# Patient Record
Sex: Female | Born: 1950
Health system: Southern US, Community
[De-identification: ages and names within clinical notes are randomized; demographics above are authoritative.]

## PROBLEM LIST (undated history)

## (undated) DIAGNOSIS — M1990 Unspecified osteoarthritis, unspecified site: Secondary | ICD-10-CM

## (undated) DIAGNOSIS — D649 Anemia, unspecified: Secondary | ICD-10-CM

## (undated) DIAGNOSIS — E669 Obesity, unspecified: Secondary | ICD-10-CM

## (undated) DIAGNOSIS — H269 Unspecified cataract: Secondary | ICD-10-CM

## (undated) DIAGNOSIS — F329 Major depressive disorder, single episode, unspecified: Secondary | ICD-10-CM

## (undated) DIAGNOSIS — R0600 Dyspnea, unspecified: Secondary | ICD-10-CM

## (undated) DIAGNOSIS — T7840XA Allergy, unspecified, initial encounter: Secondary | ICD-10-CM

## (undated) DIAGNOSIS — Z9221 Personal history of antineoplastic chemotherapy: Secondary | ICD-10-CM

## (undated) DIAGNOSIS — F32A Depression, unspecified: Secondary | ICD-10-CM

## (undated) DIAGNOSIS — C55 Malignant neoplasm of uterus, part unspecified: Secondary | ICD-10-CM

## (undated) DIAGNOSIS — G8929 Other chronic pain: Secondary | ICD-10-CM

## (undated) DIAGNOSIS — M545 Low back pain, unspecified: Secondary | ICD-10-CM

## (undated) DIAGNOSIS — C50919 Malignant neoplasm of unspecified site of unspecified female breast: Secondary | ICD-10-CM

## (undated) DIAGNOSIS — Z800 Family history of malignant neoplasm of digestive organs: Secondary | ICD-10-CM

## (undated) DIAGNOSIS — Z8614 Personal history of Methicillin resistant Staphylococcus aureus infection: Secondary | ICD-10-CM

## (undated) DIAGNOSIS — C189 Malignant neoplasm of colon, unspecified: Secondary | ICD-10-CM

## (undated) DIAGNOSIS — Z803 Family history of malignant neoplasm of breast: Secondary | ICD-10-CM

## (undated) DIAGNOSIS — E7800 Pure hypercholesterolemia, unspecified: Secondary | ICD-10-CM

## (undated) DIAGNOSIS — M549 Dorsalgia, unspecified: Secondary | ICD-10-CM

## (undated) DIAGNOSIS — K219 Gastro-esophageal reflux disease without esophagitis: Secondary | ICD-10-CM

## (undated) DIAGNOSIS — I10 Essential (primary) hypertension: Secondary | ICD-10-CM

## (undated) DIAGNOSIS — G5600 Carpal tunnel syndrome, unspecified upper limb: Secondary | ICD-10-CM

## (undated) DIAGNOSIS — G589 Mononeuropathy, unspecified: Secondary | ICD-10-CM

## (undated) HISTORY — DX: Essential (primary) hypertension: I10

## (undated) HISTORY — DX: Dorsalgia, unspecified: M54.9

## (undated) HISTORY — DX: Family history of malignant neoplasm of digestive organs: Z80.0

## (undated) HISTORY — PX: BREAST SURGERY: SHX581

## (undated) HISTORY — DX: Unspecified cataract: H26.9

## (undated) HISTORY — DX: Malignant neoplasm of unspecified site of unspecified female breast: C50.919

## (undated) HISTORY — DX: Carpal tunnel syndrome, unspecified upper limb: G56.00

## (undated) HISTORY — DX: Obesity, unspecified: E66.9

## (undated) HISTORY — DX: Allergy, unspecified, initial encounter: T78.40XA

## (undated) HISTORY — DX: Mononeuropathy, unspecified: G58.9

## (undated) HISTORY — DX: Family history of malignant neoplasm of breast: Z80.3

## (undated) HISTORY — DX: Malignant neoplasm of uterus, part unspecified: C55

## (undated) HISTORY — PX: RADIOFREQUENCY ABLATION: SHX2290

---
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
## 1992-07-13 DIAGNOSIS — C55 Malignant neoplasm of uterus, part unspecified: Secondary | ICD-10-CM

## 1992-07-13 HISTORY — DX: Malignant neoplasm of uterus, part unspecified: C55

## 1992-07-13 HISTORY — PX: ABDOMINAL HYSTERECTOMY: SHX81

## 2004-07-26 ENCOUNTER — Emergency Department: Payer: Self-pay | Admitting: Emergency Medicine

## 2006-02-05 ENCOUNTER — Ambulatory Visit: Payer: Self-pay | Admitting: General Surgery

## 2006-02-16 ENCOUNTER — Ambulatory Visit: Payer: Self-pay | Admitting: Family Medicine

## 2007-05-24 ENCOUNTER — Ambulatory Visit: Payer: Self-pay | Admitting: Family Medicine

## 2007-07-18 DIAGNOSIS — F4320 Adjustment disorder, unspecified: Secondary | ICD-10-CM | POA: Insufficient documentation

## 2008-04-21 DIAGNOSIS — F17200 Nicotine dependence, unspecified, uncomplicated: Secondary | ICD-10-CM | POA: Insufficient documentation

## 2008-05-25 ENCOUNTER — Ambulatory Visit: Payer: Self-pay | Admitting: Family Medicine

## 2009-08-30 DIAGNOSIS — E7800 Pure hypercholesterolemia, unspecified: Secondary | ICD-10-CM | POA: Insufficient documentation

## 2010-08-22 ENCOUNTER — Ambulatory Visit: Payer: Self-pay | Admitting: Internal Medicine

## 2010-08-27 ENCOUNTER — Ambulatory Visit: Payer: Self-pay | Admitting: Family Medicine

## 2011-04-24 ENCOUNTER — Ambulatory Visit: Payer: Self-pay | Admitting: General Surgery

## 2011-04-24 LAB — HM COLONOSCOPY

## 2011-09-17 ENCOUNTER — Ambulatory Visit: Payer: Self-pay | Admitting: Family Medicine

## 2012-07-19 ENCOUNTER — Ambulatory Visit: Payer: Self-pay | Admitting: Family Medicine

## 2012-08-17 ENCOUNTER — Ambulatory Visit: Payer: Self-pay | Admitting: Neurology

## 2012-09-19 ENCOUNTER — Ambulatory Visit: Payer: Self-pay | Admitting: Neurology

## 2012-09-19 LAB — CSF CELL COUNT WITH DIFFERENTIAL
CSF Tube #: 1
CSF Tube #: 4
Eosinophil: 0 %
Eosinophil: 0 %
Lymphocytes: 80 %
Lymphocytes: 89 %
Monocytes/Macrophages: 20 %
Monocytes/Macrophages: 4 %
Neutrophils: 0 %
Neutrophils: 7 %
Other Cells: 0 %
Other Cells: 0 %
RBC (CSF): 0 /mm3
RBC (CSF): 250 /mm3
WBC (CSF): 22 /mm3
WBC (CSF): 49 /mm3

## 2012-09-19 LAB — ALBUMIN: Albumin: 3.6 g/dL (ref 3.4–5.0)

## 2012-09-19 LAB — PROTIME-INR
INR: 1
Prothrombin Time: 13 secs (ref 11.5–14.7)

## 2012-09-19 LAB — GLUCOSE, CSF: Glucose, CSF: 58 mg/dL (ref 40–75)

## 2012-09-19 LAB — PLATELET COUNT: Platelet: 240 10*3/uL (ref 150–440)

## 2012-09-19 LAB — PROTEIN, CSF: Protein, CSF: 41 mg/dL (ref 15–45)

## 2012-09-19 LAB — APTT: Activated PTT: 32.6 secs (ref 23.6–35.9)

## 2012-09-22 LAB — CSF CULTURE W GRAM STAIN

## 2012-09-30 ENCOUNTER — Ambulatory Visit: Payer: Self-pay | Admitting: Neurology

## 2013-07-13 DIAGNOSIS — C50919 Malignant neoplasm of unspecified site of unspecified female breast: Secondary | ICD-10-CM

## 2013-07-13 HISTORY — DX: Malignant neoplasm of unspecified site of unspecified female breast: C50.919

## 2013-11-20 ENCOUNTER — Ambulatory Visit: Payer: Self-pay | Admitting: Neurology

## 2013-11-23 ENCOUNTER — Ambulatory Visit: Payer: Self-pay | Admitting: Pain Medicine

## 2013-11-29 ENCOUNTER — Ambulatory Visit: Payer: Self-pay | Admitting: Pain Medicine

## 2013-12-26 ENCOUNTER — Ambulatory Visit: Payer: Self-pay | Admitting: Pain Medicine

## 2014-01-03 ENCOUNTER — Ambulatory Visit: Payer: Self-pay | Admitting: Pain Medicine

## 2014-01-30 ENCOUNTER — Ambulatory Visit: Payer: Self-pay | Admitting: Pain Medicine

## 2014-02-05 ENCOUNTER — Ambulatory Visit: Payer: Self-pay | Admitting: Pain Medicine

## 2014-03-06 ENCOUNTER — Ambulatory Visit: Payer: Self-pay | Admitting: Pain Medicine

## 2014-03-21 ENCOUNTER — Ambulatory Visit: Payer: Self-pay | Admitting: Pain Medicine

## 2014-04-17 ENCOUNTER — Ambulatory Visit: Payer: Self-pay | Admitting: Pain Medicine

## 2014-04-30 ENCOUNTER — Ambulatory Visit: Payer: Self-pay | Admitting: Pain Medicine

## 2014-05-03 ENCOUNTER — Ambulatory Visit: Payer: Self-pay | Admitting: Family Medicine

## 2014-05-09 ENCOUNTER — Ambulatory Visit: Payer: Self-pay | Admitting: Family Medicine

## 2014-05-13 HISTORY — PX: BREAST BIOPSY: SHX20

## 2014-05-16 ENCOUNTER — Ambulatory Visit: Payer: Self-pay | Admitting: Family Medicine

## 2014-05-22 ENCOUNTER — Ambulatory Visit: Payer: Self-pay | Admitting: Pain Medicine

## 2014-05-24 ENCOUNTER — Ambulatory Visit: Payer: Self-pay | Admitting: Oncology

## 2014-05-24 ENCOUNTER — Other Ambulatory Visit: Payer: Self-pay | Admitting: Family Medicine

## 2014-05-24 DIAGNOSIS — C50912 Malignant neoplasm of unspecified site of left female breast: Secondary | ICD-10-CM

## 2014-05-28 ENCOUNTER — Ambulatory Visit: Payer: Self-pay | Admitting: Pain Medicine

## 2014-05-29 ENCOUNTER — Ambulatory Visit: Payer: Self-pay | Admitting: Oncology

## 2014-05-29 LAB — CREATININE, SERUM
Creatinine: 1.1 mg/dL (ref 0.60–1.30)
EGFR (African American): >60
EGFR (Non-African Amer.): 53 — ABNORMAL LOW

## 2014-05-30 LAB — CANCER ANTIGEN 27.29: CA 27.29: 18 U/mL (ref 0.0–38.6)

## 2014-05-30 LAB — CEA: CEA: 2 ng/mL (ref 0.0–4.7)

## 2014-05-30 LAB — CA 125: CA 125: 8.4 U/mL (ref 0.0–34.0)

## 2014-05-30 LAB — CANCER ANTIGEN 19-9: CA 19-9: <1 U/mL (ref 0–35)

## 2014-06-06 ENCOUNTER — Ambulatory Visit
Admission: RE | Admit: 2014-06-06 | Discharge: 2014-06-06 | Disposition: A | Discharge status: Home / Self Care | Payer: BC Managed Care – PPO | Source: Ambulatory Visit | Attending: Family Medicine | Admitting: Family Medicine

## 2014-06-06 DIAGNOSIS — C50912 Malignant neoplasm of unspecified site of left female breast: Secondary | ICD-10-CM

## 2014-06-06 MED ORDER — GADOBENATE DIMEGLUMINE 529 MG/ML IV SOLN
20.0000 mL | Freq: Once | INTRAVENOUS | Status: AC | PRN
  Administered 2014-06-06: 20 mL via INTRAVENOUS

## 2014-06-12 ENCOUNTER — Ambulatory Visit: Payer: Self-pay | Admitting: Oncology

## 2014-06-20 ENCOUNTER — Encounter: Payer: Self-pay | Admitting: General Surgery

## 2014-06-20 ENCOUNTER — Ambulatory Visit (INDEPENDENT_AMBULATORY_CARE_PROVIDER_SITE_OTHER): Payer: BC Managed Care – PPO | Admitting: General Surgery

## 2014-06-20 VITALS — BP 140/80 | HR 74 | Resp 12 | Ht 64.0 in | Wt 211.0 lb

## 2014-06-20 DIAGNOSIS — C773 Secondary and unspecified malignant neoplasm of axilla and upper limb lymph nodes: Secondary | ICD-10-CM

## 2014-06-20 DIAGNOSIS — C50912 Malignant neoplasm of unspecified site of left female breast: Secondary | ICD-10-CM

## 2014-06-20 NOTE — Patient Instructions (Addendum)
Patient to be scheduled for port placement.   Patient's surgery has been scheduled for 07-03-14 at [facility].

## 2014-06-20 NOTE — Progress Notes (Addendum)
Patient ID: Carrie Alvarez, female   DOB: 12/31/1950, 63 y.o.   MRN: [id number]  Chief Complaint  Patient presents with  . Other    discuss port placement    HPI Carrie Alvarez is a 63 y.o. female.  Here today to discuss having a port placement.Patient states she had a mammgram,ultrasound and a left breast biopsy done in November 2015.Patient does performs self breast check and get regular mammograms. Patient to have chemo treatments. The patient is accompanied by her [place].  The patient's evaluation has shown an axillary lymph node positive for adenocarcinoma on biopsy. No breast primary has been identified.  HPI  Past Medical History  Diagnosis Date  . Hypertension     Past Surgical History  Procedure Laterality Date  . Abdominal hysterectomy    . Breast surgery    . Breast biopsy Left 05/2014    Family History  Problem Relation Age of Onset  . Breast cancer Sister   . Colon cancer Mother   . Colon cancer Sister     Social History History  Substance Use Topics  . Smoking status: Former Smoker -- 1.00 packs/day    Types: Cigarettes  . Smokeless tobacco: Never Used  . Alcohol Use: No    No Known Allergies  Current Outpatient Prescriptions  Medication Sig Dispense Refill  . amLODipine (NORVASC) 2.5 MG tablet Take 2.5 mg by mouth daily.   0  . gabapentin (NEURONTIN) 300 MG capsule Take 600 mg by mouth 2 (two) times daily.   2  . hydrochlorothiazide (MICROZIDE) 12.5 MG capsule Take 12.5 mg by mouth daily.   1  . meloxicam (MOBIC) 15 MG tablet Take 15 mg by mouth daily.   0  . sertraline (ZOLOFT) 50 MG tablet Take 200 mg by mouth at bedtime.   1  . traMADol (ULTRAM) 50 MG tablet 50 mg every 6 (six) hours as needed.   0  . vitamin B-12 (CYANOCOBALAMIN) 500 MCG tablet Take 500 mcg by mouth daily.     No current facility-administered medications for this visit.    Review of Systems Review of Systems  Constitutional: Negative.    Respiratory: Negative.   Cardiovascular: Negative.     Blood pressure 140/80, pulse 74, resp. rate 12, height 5\' 4"  (1.626 m), weight 211 lb (95.709 kg).  Physical Exam Physical Exam  Constitutional: She is oriented to person, place, and time. She appears well-developed and well-nourished.  Eyes: Conjunctivae are normal. No scleral icterus.  Neck: Neck supple.  Fullness in the left supraclavicular  area.   Cardiovascular: Normal rate, regular rhythm and normal heart sounds.   Pulmonary/Chest: Effort normal and breath sounds normal.  Lymphadenopathy:    She has cervical adenopathy.       Left cervical: Superficial cervical adenopathy present.    She has axillary adenopathy.  Modest upper extremity edema is noted on the left.  Measurements obtained 15 cm superior to the olecranon process, as well as 10 and 20 cm below the olecranon process on each upper extremity were obtained.  On the right side these measurements were 37, 31 and 21 cm respectively.  On the left upper extremity these measurements were 39.5, 32 and 22.5 cm.   Evidence of early lymphedema.  Neurological: She is alert and oriented to person, place, and time.  Skin: Skin is warm and dry.  Left forearm swelling.    Data Reviewed Biopsy shows metastatic adenocarcinoma, estrogen receptor positive.   Assessment  Metastatic breast cancer, need for central venous access.   Lymphedema left upper extremity secondary to nodal disease.    Plan    Discussed port placement. The risks associated with the procedure including vascular injury and the possible need for chest tube were reviewed.   Consideration should be given for early referral to occupational therapy if progressive edema is noted after initiation of chemotherapy.    Patient's surgery has been scheduled for 07-03-14 at [facility].  PCP:  Margarita Rana Ref: Dr. Tonny Bollman, Forest Gleason 06/21/2014, 7:39 AM

## 2014-06-21 ENCOUNTER — Ambulatory Visit: Payer: Self-pay | Admitting: Pain Medicine

## 2014-06-21 ENCOUNTER — Other Ambulatory Visit: Payer: Self-pay | Admitting: General Surgery

## 2014-06-21 DIAGNOSIS — C773 Secondary and unspecified malignant neoplasm of axilla and upper limb lymph nodes: Principal | ICD-10-CM

## 2014-06-21 DIAGNOSIS — C50912 Malignant neoplasm of unspecified site of left female breast: Secondary | ICD-10-CM

## 2014-06-21 DIAGNOSIS — C50919 Malignant neoplasm of unspecified site of unspecified female breast: Secondary | ICD-10-CM | POA: Insufficient documentation

## 2014-06-28 ENCOUNTER — Ambulatory Visit: Payer: Self-pay | Admitting: Anesthesiology

## 2014-06-28 LAB — POTASSIUM: Potassium: 3.8 mmol/L (ref 3.5–5.1)

## 2014-07-03 ENCOUNTER — Ambulatory Visit: Payer: Self-pay | Admitting: General Surgery

## 2014-07-03 DIAGNOSIS — C50912 Malignant neoplasm of unspecified site of left female breast: Secondary | ICD-10-CM

## 2014-07-09 ENCOUNTER — Encounter: Payer: Self-pay | Admitting: General Surgery

## 2014-07-09 LAB — COMPREHENSIVE METABOLIC PANEL
Albumin: 3.3 g/dL — ABNORMAL LOW (ref 3.4–5.0)
Alkaline Phosphatase: 81 U/L
Anion Gap: 6 — ABNORMAL LOW (ref 7–16)
BUN: 14 mg/dL (ref 7–18)
Bilirubin,Total: 0.5 mg/dL (ref 0.2–1.0)
Calcium, Total: 9.3 mg/dL (ref 8.5–10.1)
Chloride: 106 mmol/L (ref 98–107)
Co2: 31 mmol/L (ref 21–32)
Creatinine: 1.15 mg/dL (ref 0.60–1.30)
EGFR (African American): >60
EGFR (Non-African Amer.): 51 — ABNORMAL LOW
Glucose: 87 mg/dL (ref 65–99)
Osmolality: 285 (ref 275–301)
Potassium: 3.6 mmol/L (ref 3.5–5.1)
SGOT(AST): 13 U/L — ABNORMAL LOW (ref 15–37)
SGPT (ALT): 18 U/L
Sodium: 143 mmol/L (ref 136–145)
Total Protein: 7 g/dL (ref 6.4–8.2)

## 2014-07-09 LAB — CBC CANCER CENTER
Basophil #: 0.1 x10 3/mm (ref 0.0–0.1)
Basophil %: 0.7 %
Eosinophil #: 0.1 x10 3/mm (ref 0.0–0.7)
Eosinophil %: 1.7 %
HCT: 36.8 % (ref 35.0–47.0)
HGB: 12.2 g/dL (ref 12.0–16.0)
Lymphocyte #: 1.6 x10 3/mm (ref 1.0–3.6)
Lymphocyte %: 18.6 %
MCH: 27 pg (ref 26.0–34.0)
MCHC: 33.2 g/dL (ref 32.0–36.0)
MCV: 81 fL (ref 80–100)
Monocyte #: 0.5 x10 3/mm (ref 0.2–0.9)
Monocyte %: 5.7 %
Neutrophil #: 6.4 x10 3/mm (ref 1.4–6.5)
Neutrophil %: 73.3 %
Platelet: 237 x10 3/mm (ref 150–440)
RBC: 4.53 10*6/uL (ref 3.80–5.20)
RDW: 14.1 % (ref 11.5–14.5)
WBC: 8.7 x10 3/mm (ref 3.6–11.0)

## 2014-07-13 ENCOUNTER — Ambulatory Visit: Payer: Self-pay | Admitting: Oncology

## 2014-07-13 DIAGNOSIS — Z9221 Personal history of antineoplastic chemotherapy: Secondary | ICD-10-CM

## 2014-07-13 HISTORY — DX: Personal history of antineoplastic chemotherapy: Z92.21

## 2014-07-16 LAB — COMPREHENSIVE METABOLIC PANEL
Albumin: 3.5 g/dL (ref 3.4–5.0)
Alkaline Phosphatase: 101 U/L
Anion Gap: 11 (ref 7–16)
BUN: 22 mg/dL — ABNORMAL HIGH (ref 7–18)
Bilirubin,Total: 1.8 mg/dL — ABNORMAL HIGH (ref 0.2–1.0)
Calcium, Total: 9.5 mg/dL (ref 8.5–10.1)
Chloride: 102 mmol/L (ref 98–107)
Co2: 26 mmol/L (ref 21–32)
Creatinine: 0.97 mg/dL (ref 0.60–1.30)
EGFR (African American): >60
EGFR (Non-African Amer.): >60
Glucose: 95 mg/dL (ref 65–99)
Osmolality: 281 (ref 275–301)
Potassium: 3.3 mmol/L — ABNORMAL LOW (ref 3.5–5.1)
SGOT(AST): 18 U/L (ref 15–37)
SGPT (ALT): 41 U/L
Sodium: 139 mmol/L (ref 136–145)
Total Protein: 7.5 g/dL (ref 6.4–8.2)

## 2014-07-18 ENCOUNTER — Ambulatory Visit: Payer: Self-pay | Admitting: Pain Medicine

## 2014-07-23 LAB — CBC CANCER CENTER
Basophil #: 0.1 x10 3/mm (ref 0.0–0.1)
Basophil %: 0.6 %
Eosinophil #: 0 x10 3/mm (ref 0.0–0.7)
Eosinophil %: 0 %
HCT: 28.3 % — ABNORMAL LOW (ref 35.0–47.0)
HGB: 9.3 g/dL — ABNORMAL LOW (ref 12.0–16.0)
Lymphocyte #: 1.4 x10 3/mm (ref 1.0–3.6)
Lymphocyte %: 12.4 %
MCH: 27.8 pg (ref 26.0–34.0)
MCHC: 32.7 g/dL (ref 32.0–36.0)
MCV: 85 fL (ref 80–100)
Monocyte #: 0.8 x10 3/mm (ref 0.2–0.9)
Monocyte %: 7.5 %
Neutrophil #: 8.8 x10 3/mm — ABNORMAL HIGH (ref 1.4–6.5)
Neutrophil %: 79.5 %
Platelet: 282 x10 3/mm (ref 150–440)
RBC: 3.33 10*6/uL — ABNORMAL LOW (ref 3.80–5.20)
RDW: 13.6 % (ref 11.5–14.5)
WBC: 11.1 x10 3/mm — ABNORMAL HIGH (ref 3.6–11.0)

## 2014-07-23 LAB — COMPREHENSIVE METABOLIC PANEL
Albumin: 3.3 g/dL — ABNORMAL LOW (ref 3.4–5.0)
Alkaline Phosphatase: 91 U/L
Anion Gap: 8 (ref 7–16)
BUN: 17 mg/dL (ref 7–18)
Bilirubin,Total: 0.2 mg/dL (ref 0.2–1.0)
Calcium, Total: 8.7 mg/dL (ref 8.5–10.1)
Chloride: 108 mmol/L — ABNORMAL HIGH (ref 98–107)
Co2: 28 mmol/L (ref 21–32)
Creatinine: 1.18 mg/dL (ref 0.60–1.30)
EGFR (African American): 60 — ABNORMAL LOW
EGFR (Non-African Amer.): 49 — ABNORMAL LOW
Glucose: 119 mg/dL — ABNORMAL HIGH (ref 65–99)
Osmolality: 290 (ref 275–301)
Potassium: 3.4 mmol/L — ABNORMAL LOW (ref 3.5–5.1)
SGOT(AST): 10 U/L — ABNORMAL LOW (ref 15–37)
SGPT (ALT): 24 U/L
Sodium: 144 mmol/L (ref 136–145)
Total Protein: 6.8 g/dL (ref 6.4–8.2)

## 2014-08-06 LAB — COMPREHENSIVE METABOLIC PANEL
Albumin: 3.1 g/dL — ABNORMAL LOW (ref 3.4–5.0)
Alkaline Phosphatase: 89 U/L
Anion Gap: 10 (ref 7–16)
BUN: 12 mg/dL (ref 7–18)
Bilirubin,Total: 0.2 mg/dL (ref 0.2–1.0)
Calcium, Total: 8.6 mg/dL (ref 8.5–10.1)
Chloride: 106 mmol/L (ref 98–107)
Co2: 26 mmol/L (ref 21–32)
Creatinine: 1.21 mg/dL (ref 0.60–1.30)
EGFR (African American): 58 — ABNORMAL LOW
EGFR (Non-African Amer.): 48 — ABNORMAL LOW
Glucose: 88 mg/dL (ref 65–99)
Osmolality: 282 (ref 275–301)
Potassium: 3.4 mmol/L — ABNORMAL LOW (ref 3.5–5.1)
SGOT(AST): 9 U/L — ABNORMAL LOW (ref 15–37)
SGPT (ALT): 21 U/L
Sodium: 142 mmol/L (ref 136–145)
Total Protein: 6.5 g/dL (ref 6.4–8.2)

## 2014-08-06 LAB — CBC CANCER CENTER
Basophil #: 0 x10 3/mm (ref 0.0–0.1)
Basophil %: 0.3 %
Eosinophil #: 0 x10 3/mm (ref 0.0–0.7)
Eosinophil %: 0.1 %
HCT: 20.7 % — ABNORMAL LOW (ref 35.0–47.0)
HGB: 6.9 g/dL — ABNORMAL LOW (ref 12.0–16.0)
Lymphocyte #: 1.1 x10 3/mm (ref 1.0–3.6)
Lymphocyte %: 10.3 %
MCH: 30 pg (ref 26.0–34.0)
MCHC: 33.3 g/dL (ref 32.0–36.0)
MCV: 90 fL (ref 80–100)
Monocyte #: 0.9 x10 3/mm (ref 0.2–0.9)
Monocyte %: 8.7 %
Neutrophil #: 8.3 x10 3/mm — ABNORMAL HIGH (ref 1.4–6.5)
Neutrophil %: 80.6 %
Platelet: 171 x10 3/mm (ref 150–440)
RBC: 2.29 10*6/uL — ABNORMAL LOW (ref 3.80–5.20)
RDW: 21.8 % — ABNORMAL HIGH (ref 11.5–14.5)
WBC: 10.3 x10 3/mm (ref 3.6–11.0)

## 2014-08-13 ENCOUNTER — Ambulatory Visit: Payer: Self-pay | Admitting: Oncology

## 2014-08-13 LAB — COMPREHENSIVE METABOLIC PANEL
Albumin: 3.1 g/dL — ABNORMAL LOW (ref 3.4–5.0)
Alkaline Phosphatase: 79 U/L (ref 46–116)
Anion Gap: 11 (ref 7–16)
BUN: 8 mg/dL (ref 7–18)
Bilirubin,Total: 0.3 mg/dL (ref 0.2–1.0)
Calcium, Total: 9.1 mg/dL (ref 8.5–10.1)
Chloride: 108 mmol/L — ABNORMAL HIGH (ref 98–107)
Co2: 26 mmol/L (ref 21–32)
Creatinine: 1.07 mg/dL (ref 0.60–1.30)
EGFR (African American): >60
EGFR (Non-African Amer.): 55 — ABNORMAL LOW
Glucose: 95 mg/dL (ref 65–99)
Osmolality: 287 (ref 275–301)
Potassium: 3.5 mmol/L (ref 3.5–5.1)
SGOT(AST): 15 U/L (ref 15–37)
SGPT (ALT): 20 U/L (ref 14–63)
Sodium: 145 mmol/L (ref 136–145)
Total Protein: 6.7 g/dL (ref 6.4–8.2)

## 2014-08-13 LAB — CBC CANCER CENTER
Basophil #: 0 x10 3/mm (ref 0.0–0.1)
Basophil %: 0.4 %
Eosinophil #: 0 x10 3/mm (ref 0.0–0.7)
Eosinophil %: 0.1 %
HCT: 30.6 % — ABNORMAL LOW (ref 35.0–47.0)
HGB: 10 g/dL — ABNORMAL LOW (ref 12.0–16.0)
Lymphocyte #: 0.7 x10 3/mm — ABNORMAL LOW (ref 1.0–3.6)
Lymphocyte %: 9.4 %
MCH: 29.2 pg (ref 26.0–34.0)
MCHC: 32.5 g/dL (ref 32.0–36.0)
MCV: 90 fL (ref 80–100)
Monocyte #: 0.7 x10 3/mm (ref 0.2–0.9)
Monocyte %: 10.7 %
Neutrophil #: 5.6 x10 3/mm (ref 1.4–6.5)
Neutrophil %: 79.4 %
Platelet: 394 x10 3/mm (ref 150–440)
RBC: 3.41 10*6/uL — ABNORMAL LOW (ref 3.80–5.20)
RDW: 22.5 % — ABNORMAL HIGH (ref 11.5–14.5)
WBC: 7 x10 3/mm (ref 3.6–11.0)

## 2014-08-15 ENCOUNTER — Ambulatory Visit: Payer: Self-pay | Admitting: Pain Medicine

## 2014-08-20 ENCOUNTER — Observation Stay: Payer: Self-pay | Admitting: Internal Medicine

## 2014-08-20 LAB — URINALYSIS, COMPLETE
Bacteria: NONE SEEN
Bilirubin,UR: NEGATIVE
Blood: NEGATIVE
Glucose,UR: NEGATIVE mg/dL (ref 0–75)
Ketone: NEGATIVE
Leukocyte Esterase: NEGATIVE
Nitrite: NEGATIVE
Ph: 6 (ref 4.5–8.0)
Protein: NEGATIVE
RBC,UR: 1 /HPF (ref 0–5)
Specific Gravity: 1.016 (ref 1.003–1.030)
Squamous Epithelial: 3
WBC UR: 2 /HPF (ref 0–5)

## 2014-08-20 LAB — CBC WITH DIFFERENTIAL/PLATELET
Basophil #: 0 10*3/uL (ref 0.0–0.1)
Basophil %: 3.8 %
Eosinophil #: 0 10*3/uL (ref 0.0–0.7)
Eosinophil %: 1.3 %
HCT: 25.3 % — ABNORMAL LOW (ref 35.0–47.0)
HGB: 8.1 g/dL — ABNORMAL LOW (ref 12.0–16.0)
Lymphocyte #: 0.2 10*3/uL — ABNORMAL LOW (ref 1.0–3.6)
Lymphocyte %: 30.9 %
MCH: 29.2 pg (ref 26.0–34.0)
MCHC: 32.1 g/dL (ref 32.0–36.0)
MCV: 91 fL (ref 80–100)
Monocyte #: 0.1 x10 3/mm — ABNORMAL LOW (ref 0.2–0.9)
Monocyte %: 9.4 %
Neutrophil #: 0.4 10*3/uL — ABNORMAL LOW (ref 1.4–6.5)
Neutrophil %: 54.6 %
Platelet: 164 10*3/uL (ref 150–440)
RBC: 2.79 10*6/uL — ABNORMAL LOW (ref 3.80–5.20)
RDW: 18.6 % — ABNORMAL HIGH (ref 11.5–14.5)
WBC: 0.7 10*3/uL — CL (ref 3.6–11.0)

## 2014-08-20 LAB — COMPREHENSIVE METABOLIC PANEL
Albumin: 3.9 g/dL (ref 3.4–5.0)
Alkaline Phosphatase: 103 U/L (ref 46–116)
Anion Gap: 14 (ref 7–16)
BUN: 22 mg/dL — ABNORMAL HIGH (ref 7–18)
Bilirubin,Total: 2.4 mg/dL — ABNORMAL HIGH (ref 0.2–1.0)
Calcium, Total: 10.4 mg/dL — ABNORMAL HIGH (ref 8.5–10.1)
Chloride: 103 mmol/L (ref 98–107)
Co2: 24 mmol/L (ref 21–32)
Creatinine: 1.25 mg/dL (ref 0.60–1.30)
EGFR (African American): 56 — ABNORMAL LOW
EGFR (Non-African Amer.): 46 — ABNORMAL LOW
Glucose: 119 mg/dL — ABNORMAL HIGH (ref 65–99)
Osmolality: 286 (ref 275–301)
Potassium: 4.3 mmol/L (ref 3.5–5.1)
SGOT(AST): 33 U/L (ref 15–37)
SGPT (ALT): 28 U/L (ref 14–63)
Sodium: 141 mmol/L (ref 136–145)
Total Protein: 7.4 g/dL (ref 6.4–8.2)

## 2014-08-20 LAB — TROPONIN I: Troponin-I: <0.02 ng/mL

## 2014-08-20 LAB — LIPASE, BLOOD: Lipase: 47 U/L — ABNORMAL LOW (ref 73–393)

## 2014-09-11 ENCOUNTER — Ambulatory Visit
Admit: 2014-09-11 | Disposition: A | Discharge status: Home / Self Care | Payer: Self-pay | Attending: Oncology | Admitting: Oncology

## 2014-09-13 ENCOUNTER — Ambulatory Visit: Payer: Self-pay | Admitting: Pain Medicine

## 2014-10-08 LAB — CBC CANCER CENTER
Basophil #: 0 x10 3/mm (ref 0.0–0.1)
Basophil %: 0.9 %
Eosinophil #: 0 x10 3/mm (ref 0.0–0.7)
Eosinophil %: 1 %
HCT: 28.7 % — ABNORMAL LOW (ref 35.0–47.0)
HGB: 9.6 g/dL — ABNORMAL LOW (ref 12.0–16.0)
Lymphocyte #: 0.8 x10 3/mm — ABNORMAL LOW (ref 1.0–3.6)
Lymphocyte %: 16.7 %
MCH: 29.9 pg (ref 26.0–34.0)
MCHC: 33.4 g/dL (ref 32.0–36.0)
MCV: 90 fL (ref 80–100)
Monocyte #: 0.2 x10 3/mm (ref 0.2–0.9)
Monocyte %: 4.6 %
Neutrophil #: 3.7 x10 3/mm (ref 1.4–6.5)
Neutrophil %: 76.8 %
Platelet: 228 x10 3/mm (ref 150–440)
RBC: 3.21 10*6/uL — ABNORMAL LOW (ref 3.80–5.20)
RDW: 18 % — ABNORMAL HIGH (ref 11.5–14.5)
WBC: 4.8 x10 3/mm (ref 3.6–11.0)

## 2014-10-08 LAB — BASIC METABOLIC PANEL
Anion Gap: 5 — ABNORMAL LOW (ref 7–16)
BUN: 18 mg/dL
Calcium, Total: 9.2 mg/dL
Chloride: 108 mmol/L
Co2: 25 mmol/L
Creatinine: 1.11 mg/dL — ABNORMAL HIGH
EGFR (African American): >60
EGFR (Non-African Amer.): 52 — ABNORMAL LOW
Glucose: 106 mg/dL — ABNORMAL HIGH
Potassium: 3.5 mmol/L
Sodium: 138 mmol/L

## 2014-10-08 LAB — CREATININE, SERUM: Creatine, Serum: 1.11

## 2014-10-11 ENCOUNTER — Ambulatory Visit
Admit: 2014-10-11 | Disposition: A | Discharge status: Home / Self Care | Payer: Self-pay | Attending: Pain Medicine | Admitting: Pain Medicine

## 2014-10-12 ENCOUNTER — Ambulatory Visit
Admit: 2014-10-12 | Disposition: A | Discharge status: Home / Self Care | Payer: Self-pay | Attending: Oncology | Admitting: Oncology

## 2014-10-13 DIAGNOSIS — F329 Major depressive disorder, single episode, unspecified: Secondary | ICD-10-CM | POA: Insufficient documentation

## 2014-10-13 DIAGNOSIS — F32A Depression, unspecified: Secondary | ICD-10-CM | POA: Insufficient documentation

## 2014-10-15 LAB — BASIC METABOLIC PANEL
Anion Gap: 6 — ABNORMAL LOW (ref 7–16)
BUN: 13 mg/dL
Calcium, Total: 9.2 mg/dL
Chloride: 103 mmol/L
Co2: 27 mmol/L
Creatinine: 1.26 mg/dL — ABNORMAL HIGH
EGFR (African American): 52 — ABNORMAL LOW
EGFR (Non-African Amer.): 45 — ABNORMAL LOW
Glucose: 115 mg/dL — ABNORMAL HIGH
Potassium: 3.8 mmol/L
Sodium: 136 mmol/L

## 2014-10-15 LAB — CBC CANCER CENTER
Basophil #: 0 x10 3/mm (ref 0.0–0.1)
Basophil %: 0.5 %
Eosinophil #: 0.1 x10 3/mm (ref 0.0–0.7)
Eosinophil %: 1 %
HCT: 32.3 % — ABNORMAL LOW (ref 35.0–47.0)
HGB: 10.5 g/dL — ABNORMAL LOW (ref 12.0–16.0)
Lymphocyte #: 0.8 x10 3/mm — ABNORMAL LOW (ref 1.0–3.6)
Lymphocyte %: 12.8 %
MCH: 29.4 pg (ref 26.0–34.0)
MCHC: 32.6 g/dL (ref 32.0–36.0)
MCV: 90 fL (ref 80–100)
Monocyte #: 0.6 x10 3/mm (ref 0.2–0.9)
Monocyte %: 10.2 %
Neutrophil #: 4.6 x10 3/mm (ref 1.4–6.5)
Neutrophil %: 75.5 %
Platelet: 226 x10 3/mm (ref 150–440)
RBC: 3.58 10*6/uL — ABNORMAL LOW (ref 3.80–5.20)
RDW: 17.4 % — ABNORMAL HIGH (ref 11.5–14.5)
WBC: 6.1 x10 3/mm (ref 3.6–11.0)

## 2014-10-22 ENCOUNTER — Other Ambulatory Visit: Payer: Self-pay | Admitting: Oncology

## 2014-10-22 LAB — CBC CANCER CENTER
Basophil #: 0 x10 3/mm (ref 0.0–0.1)
Basophil %: 0.3 %
Eosinophil #: 0.1 x10 3/mm (ref 0.0–0.7)
Eosinophil %: 0.8 %
HCT: 32.1 % — ABNORMAL LOW (ref 35.0–47.0)
HGB: 10.7 g/dL — ABNORMAL LOW (ref 12.0–16.0)
Lymphocyte #: 0.7 x10 3/mm — ABNORMAL LOW (ref 1.0–3.6)
Lymphocyte %: 8.8 %
MCH: 29.4 pg (ref 26.0–34.0)
MCHC: 33.2 g/dL (ref 32.0–36.0)
MCV: 88 fL (ref 80–100)
Monocyte #: 0.4 x10 3/mm (ref 0.2–0.9)
Monocyte %: 4.9 %
Neutrophil #: 7.1 x10 3/mm — ABNORMAL HIGH (ref 1.4–6.5)
Neutrophil %: 85.2 %
Platelet: 246 x10 3/mm (ref 150–440)
RBC: 3.63 10*6/uL — ABNORMAL LOW (ref 3.80–5.20)
RDW: 17 % — ABNORMAL HIGH (ref 11.5–14.5)
WBC: 8.4 x10 3/mm (ref 3.6–11.0)

## 2014-10-22 LAB — BASIC METABOLIC PANEL
Anion Gap: 6 — ABNORMAL LOW (ref 7–16)
BUN: 17 mg/dL
Calcium, Total: 9.6 mg/dL
Chloride: 106 mmol/L
Co2: 24 mmol/L
Creatinine: 1.08 mg/dL — ABNORMAL HIGH
EGFR (African American): >60
EGFR (Non-African Amer.): 54 — ABNORMAL LOW
Glucose: 95 mg/dL
Potassium: 4.1 mmol/L
Sodium: 136 mmol/L

## 2014-10-28 ENCOUNTER — Other Ambulatory Visit: Payer: Self-pay | Admitting: Oncology

## 2014-10-29 LAB — CBC CANCER CENTER
Basophil #: 0 x10 3/mm (ref 0.0–0.1)
Basophil %: 0.3 %
Eosinophil #: 0 x10 3/mm (ref 0.0–0.7)
Eosinophil %: 0.5 %
HCT: 30.9 % — ABNORMAL LOW (ref 35.0–47.0)
HGB: 10.1 g/dL — ABNORMAL LOW (ref 12.0–16.0)
Lymphocyte #: 0.8 x10 3/mm — ABNORMAL LOW (ref 1.0–3.6)
Lymphocyte %: 12.9 %
MCH: 29 pg (ref 26.0–34.0)
MCHC: 32.7 g/dL (ref 32.0–36.0)
MCV: 89 fL (ref 80–100)
Monocyte #: 0.2 x10 3/mm (ref 0.2–0.9)
Monocyte %: 3.9 %
Neutrophil #: 5.3 x10 3/mm (ref 1.4–6.5)
Neutrophil %: 82.4 %
Platelet: 237 x10 3/mm (ref 150–440)
RBC: 3.48 10*6/uL — ABNORMAL LOW (ref 3.80–5.20)
RDW: 17.8 % — ABNORMAL HIGH (ref 11.5–14.5)
WBC: 6.4 x10 3/mm (ref 3.6–11.0)

## 2014-10-29 LAB — BASIC METABOLIC PANEL
Anion Gap: 7 (ref 7–16)
BUN: 17 mg/dL
Calcium, Total: 9.2 mg/dL
Chloride: 105 mmol/L
Co2: 25 mmol/L
Creatinine: 1.01 mg/dL — ABNORMAL HIGH
EGFR (African American): >60
EGFR (Non-African Amer.): 59 — ABNORMAL LOW
Glucose: 97 mg/dL
Potassium: 3.6 mmol/L
Sodium: 137 mmol/L

## 2014-11-03 NOTE — Op Note (Signed)
PATIENT NAME:  Carrie Alvarez, Carrie Alvarez MR#:  [id number] DATE OF BIRTH:  March 14, 1951  DATE OF PROCEDURE:  07/03/2014  PREOPERATIVE DIAGNOSIS: Advanced breast cancer.   POSTOPERATIVE DIAGNOSIS:  Advanced breast cancer.  OPERATIVE PROCEDURE: Right subclavian Power Port placement.   OPERATING SURGEON:  Operating surgeon: Hervey Ard, MD.   ANESTHESIA: Attended local, 10 mL of 1% plain Xylocaine.   ESTIMATED BLOOD LOSS: Minimal.   CLINICAL NOTE:  This 64 year old woman was noted with a abnormal mammogram and exam showed evidence of invasive mammary carcinoma with axillary metastases. She was considered a candidate for neoadjuvant chemotherapy.   OPERATIVE NOTE: With the patient under adequate sedation, the chest was prepped with ChloraPrep and draped. Ultrasound was used to confirm patency of the subclavian vein. This was cannulated under ultrasound guidance. The guidewire was advanced into the heart under fluoroscopy. The dilator was placed followed by the catheter. This was tunneled to a pocket on the right anterior chest. The port was transfixed to the deep tissues with  3point fixation using 3-0 Prolene sutures. Irrigation with injectable saline was completed showing good flow and easy aspiration of blood. A final flush with 10 mL of injectable saline was completed into the procedure. The wound was closed in layers with 3-0 Vicryl to the adipose tissue and a running 4-0 Vicryl subcuticular suture for the skin. Benzoin and Steri-Strips followed by Telfa and Tegaderm dressings were applied.   Erect portable chest x-ray in the recovery room showed the catheter tip just into the right atrium and no evidence of pneumothorax.     ____________________________ Robert Bellow, MD jwb:at D: 07/04/2014 09:04:52 ET T: 07/04/2014 12:43:53 ET JOB#: [id number]  cc: Robert Bellow, MD, <Dictator> Kathlene November. Grayland Ormond, MD Chrisma Hurlock Amedeo Kinsman MD ELECTRONICALLY SIGNED 07/04/2014 19:30

## 2014-11-05 LAB — BASIC METABOLIC PANEL
Anion Gap: 5 — ABNORMAL LOW (ref 7–16)
BUN: 22 mg/dL — ABNORMAL HIGH
Calcium, Total: 9.2 mg/dL
Chloride: 106 mmol/L
Co2: 26 mmol/L
Creatinine: 1.37 mg/dL — ABNORMAL HIGH
EGFR (African American): 47 — ABNORMAL LOW
EGFR (Non-African Amer.): 41 — ABNORMAL LOW
Glucose: 93 mg/dL
Potassium: 3.7 mmol/L
Sodium: 137 mmol/L

## 2014-11-05 LAB — CBC CANCER CENTER
Basophil #: 0 x10 3/mm (ref 0.0–0.1)
Basophil %: 0.9 %
Eosinophil #: 0.1 x10 3/mm (ref 0.0–0.7)
Eosinophil %: 1.2 %
HCT: 30.8 % — ABNORMAL LOW (ref 35.0–47.0)
HGB: 10 g/dL — ABNORMAL LOW (ref 12.0–16.0)
Lymphocyte #: 1 x10 3/mm (ref 1.0–3.6)
Lymphocyte %: 19.1 %
MCH: 28.8 pg (ref 26.0–34.0)
MCHC: 32.4 g/dL (ref 32.0–36.0)
MCV: 89 fL (ref 80–100)
Monocyte #: 0.3 x10 3/mm (ref 0.2–0.9)
Monocyte %: 5.8 %
Neutrophil #: 3.7 x10 3/mm (ref 1.4–6.5)
Neutrophil %: 73 %
Platelet: 237 x10 3/mm (ref 150–440)
RBC: 3.47 10*6/uL — ABNORMAL LOW (ref 3.80–5.20)
RDW: 17.8 % — ABNORMAL HIGH (ref 11.5–14.5)
WBC: 5 x10 3/mm (ref 3.6–11.0)

## 2014-11-05 LAB — SURGICAL PATHOLOGY

## 2014-11-07 ENCOUNTER — Other Ambulatory Visit: Payer: Self-pay

## 2014-11-07 DIAGNOSIS — C50912 Malignant neoplasm of unspecified site of left female breast: Secondary | ICD-10-CM

## 2014-11-07 DIAGNOSIS — C773 Secondary and unspecified malignant neoplasm of axilla and upper limb lymph nodes: Principal | ICD-10-CM

## 2014-11-11 NOTE — Discharge Summary (Signed)
PATIENT NAME:  Carrie Alvarez, Carrie Alvarez MR#:  [id number] DATE OF BIRTH:  Dec 13, 1950  DATE OF ADMISSION:  08/20/2014 DATE OF DISCHARGE:  08/22/2014  DISCHARGE DIAGNOSES: 1. Intractable nausea and vomiting due to chemotherapy.  2. Pancytopenia due to chemotherapy and transfused packed red blood cells.  3. Hypertension.   CONDITION ON DISCHARGE: Stable.   CODE STATUS: Full code.   MEDICATIONS ON DISCHARGE: 1. Flonase as needed. 2. Meloxicam 15 mg oral tablet once a day.  3. Vitamin B12 1000 mcg oral tablet once a day.  4. Hydrochlorothiazide 12.5 mg oral capsule once a day.  5. Gabapentin 300 mg oral capsule 2 times a day.  6. Sertraline 50 mg oral tablet 4 tablets once a day.  7. Reglan 10 mg oral tablet 4 times a day before meals and at bedtime.  8. Amlodipine 2.5 mg oral tablet once a day.  9. Claritin 10 mg oral tablet once a day.  10. Omeprazole 20 mg oral delayed-release capsule once a day.  11. Prochlorperazine 10 mg oral tablet every 6 hours as needed.  12. Tramadol 50 mg 3 times a day if tolerated for pain.  13. Ondansetron 4 mg oral tablet disintegrating every 6 hours as needed for nausea and vomiting.   DIET: Low sodium and regular consistency diet.   FOLLOWUP: Advised to follow in 1 week in cancer center with Dr. Grayland Ormond.  HISTORY OF PRESENT ILLNESS: A 64 year old female who was diagnosed with breast cancer and spread to lymph nodes. She was started on chemotherapy for the last 2 months. Last chemotherapy was last week, and for the last 2 days, she started having severe vomiting and nausea, not able to keep any food down. She tried some medication, Zofran, which she had at home, but it was not helping, so called Dr. Gary Fleet office, and they suggested her to go to Emergency Room. She was also feeling some dizziness, and she did not eat or drink enough in the last 2 days, so admitted to the hospital for further management of her intractable nausea and vomiting.   HOSPITAL  COURSE:  1. Intractable nausea and vomiting, which was mostly chemotherapy-induced. She was given IV fluid initially, as she was not able to eat, given IV Zofran and Phenergan, which helped to control her nausea. So, later on, we started on Zofran disintegrating tablets before each meal, and the patient was able to tolerate her diet, so we discharged her home and informed her oncologist, dr. Grayland Ormond.  2. Hypertension. We continued amlodipine, but held hydrochlorothiazide, as she was not eating or drinking enough, but later on, once the patient started eating and no more nausea, we resumed all medication.  3. Pancytopenia. This was secondary to chemotherapy. I spoke to Dr. Grayland Ormond on the phone. The patient received Neupogen in the office last week, but her hemoglobin was also dropping, so he suggested to give 1 unit of blood transfusion, but not to worry about WBC count, so she received 1 unit of transfusion. Hemoglobin remained stable after that. Advised to follow in the cancer center next week.  4. Chronic pain of her back. She was on tramadol, and we continued that on discharge.   [place]. Glucose on admission 119, BUN 22, creatinine 1.25, sodium 141, potassium 4.3, chloride 103, CO2 24, calcium is 10.4. Lipase level 47 on admission. Troponin less than 0.02. WBC count on admission was 0.7, hemoglobin 8.1, and platelet count 164,000. MCV was 91 on admission. Urinalysis was grossly  negative. WBC count dropped down to 0.6 the next day, and hemoglobin to 6.7, platelet was 130,000. After transfusion on February 10, her hemoglobin came up to 7.5. Her WBC count was 0.9, and platelet count 116,000.   TOTAL TIME SPENT ON THIS DISCHARGE: 40 minutes.    ____________________________ Ceasar Lund Anselm Jungling, MD vgv:mw D: 08/24/2014 09:07:27 ET T: 08/24/2014 13:24:48 ET JOB#: [id number]  cc: Ceasar Lund. Anselm Jungling, MD, <Dictator> Kathlene November. Grayland Ormond, MD Jerrell Belfast,  MD Rosalio Macadamia [facility] MD ELECTRONICALLY SIGNED 08/29/2014 16:00

## 2014-11-11 NOTE — H&P (Signed)
PATIENT NAME:  Carrie Alvarez, Carrie Alvarez MR#:  [id number] DATE OF BIRTH:  1951-07-02  DATE OF ADMISSION:  08/20/2014  PRIMARY CARE PHYSICIAN: Jerrell Belfast, MD  PRIMARY ONCOLOGIST: Kathlene November. Grayland Ormond, MD  PRIMARY PAIN MANAGEMENT DOCTOR: Burtis Junes, MD  REFERRING EMERGENCY ROOM PHYSICIAN: Verdia Kuba. Paduchowski, MD  CHIEF COMPLAINT: Nausea and vomiting.   HISTORY OF PRESENT ILLNESS: A 63 year old female who has left breast cancer with spread to lymph nodes, started on chemotherapy since December. Her last chemotherapy was last week. Since the last 2 days, she started having severe vomiting and nausea, and not able to keep anything by mouth. She tried some medication, Zofran, which she had, but it was also not helping, so she called Dr. Gary Fleet office and they told her to go to the Emergency Room. According to her today morning, she also was feeling dizzy as she is not able to eat or drink enough for the last 2 days. On further questioning, she denies any fever or chills, any diarrhea or abdominal pain.   REVIEW OF SYSTEMS:  CONSTITUTIONAL: Negative for fever, chills, weight loss, weight gain, or pain.  EYES: No blurring, double vision, discharge or redness.  EARS, NOSE, THROAT: No tinnitus, ear pain, or hearing loss.  RESPIRATORY: No cough, wheezing, hemoptysis, or shortness of breath.  CARDIOVASCULAR: No chest pain, orthopnea, edema, arrhythmia or palpitations.  GASTROINTESTINAL: The patient had nausea, vomiting; no diarrhea or abdominal pain.  GENITOURINARY: No dysuria, hematuria, or increased frequency.  ENDOCRINE: No heat or cold intolerance. No excessive sweating.  SKIN: No acne, rashes, or lesions.  MUSCULOSKELETAL: No pain or swelling in the joints.  NEUROLOGICAL: No numbness, weakness, tremor or vertigo.  PSYCHIATRIC: No anxiety, insomnia, bipolar disorder.   PAST MEDICAL HISTORY:  1.  Hypertension.  2.  Chronic back pain.  3.  Breast cancer with spread to the lymph  node on the left side; on chemotherapy currently since December.   PAST SURGICAL HISTORY: Total hysterectomy with incidental endometrioid adenocarcinoma finding.   SOCIAL HISTORY: She was a smoker for 20 years, smoking 1 pack per day, but stopped in December since she was diagnosed with cancer. Denies alcohol or illegal drug use. Lives with her daughter.   FAMILY HISTORY: Sister diagnosed with breast cancer at age 19, and colon cancer and Lynch syndrome. Father died from some head and neck cancer, and mother had rheumatoid arthritis  and breast cancer.   HOME MEDICATIONS:  1.  Vitamin B12 1000 mcg once a day.  2.  Tramadol 50 mg 1 tablet 3-6 times a day as needed.  3.  Sertraline 50 mg oral tablet once a day.  4.  Reglan 10 mg oral 4 times a day as needed for nausea and vomiting.  5.  Omeprazole 20 mg oral once a day.  6.  Meloxicam 15 mg oral once a day.  7.  Hydrochlorothiazide 12.5 mg oral once a day.  8.  Gabapentin 300 mg oral 2 tablets 2 times a day.  9.  Flonase nasal spray as needed.  10.  Compazine 10 mg every 6 hours for 30 days as needed.  11.  Claritin 10 mg oral tablet once a day.  12.  Amlodipine 2.5 mg once a day.   PHYSICAL EXAMINATION:  VITAL SIGNS: In ER, temperature 98.2. Pulse rate was 128 on presentation, currently 102. Respirations 18, blood pressure 112/78, and pulse oximetry is 100% on room air.  GENERAL: The patient is fully alert and oriented to time, place  and person. Does not appear in any acute distress.  HEENT: Head and neck atraumatic. Conjunctivae pink. Oral mucosa moist.  NECK: Supple. No JVD.  RESPIRATORY: Bilateral equal and clear air entry. No wheezing or crepitation.  CARDIOVASCULAR: S1, S2 present. Regular. No murmur.  ABDOMEN: Soft, nontender. Bowel sounds present. No organomegaly. No distention.  LEGS: No edema.  SKIN: No acne, rashes, or lesions.  MUSCULOSKELETAL: No tenderness or swelling in the joints.  NEUROLOGICAL: Power 5/5. Follows  commands. Moves all 4 limbs. No tremor or rigidity. Sensation intact.  PSYCHIATRIC: Does not appear in any acute psychiatric illness at this time.   IMPORTANT LABORATORY RESULTS: Glucose 119, BUN 22, creatinine 1.25, sodium 141, potassium 4.3, chloride 103, CO2 of 24, calcium 10.4. WBC 0.7, hemoglobin 8.1, platelet count 164,000 and MCV 91.   ASSESSMENT AND PLAN: A 64 year old female who is on chemotherapy secondary to breast cancer. Last chemotherapy was last week, and started having severe, nausea and vomiting for the last 2 days; not able to oral food or medication.  1.  Intractable nausea and vomiting, chemotherapy-induced. We will admit to hospital and give her IV fluid, as she is not able to eat, and we will start on IV Zofran and Phenergan on an as-needed basis for her nausea and vomiting. Once she is able to control the symptoms, we will start on a clear liquid diet, and may start on oral antiemetic medication on an as-needed basis.  2.  Hypertension. Currently, we will continue amlodipine, but hold hydrochlorothiazide as she will not be having enough oral intake.  3.  Pancytopenia. This is secondary to chemotherapy and we will continue monitoring. If she has a drop further, then we might need to call oncology consult in the hospital.  4.  Chronic pain for her back. She is on tramadol at home. Will continue once she is able to take oral medications.   TOTAL TIME SPENT ON THIS ADMISSION: 50 minutes.    ____________________________ Ceasar Lund Anselm Jungling, MD vgv:MT D: 08/20/2014 10:27:42 ET T: 08/20/2014 10:44:03 ET JOB#: [id number]  cc: Ceasar Lund. Anselm Jungling, MD, <Dictator> Jerrell Belfast, MD Kathlene November. Grayland Ormond, MD Vaughan Basta MD ELECTRONICALLY SIGNED 08/29/2014 15:58

## 2014-11-12 ENCOUNTER — Inpatient Hospital Stay: Payer: BLUE CROSS/BLUE SHIELD

## 2014-11-12 ENCOUNTER — Ambulatory Visit: Payer: Self-pay | Admitting: Oncology

## 2014-11-12 ENCOUNTER — Inpatient Hospital Stay (HOSPITAL_BASED_OUTPATIENT_CLINIC_OR_DEPARTMENT_OTHER): Payer: BLUE CROSS/BLUE SHIELD | Admitting: Oncology

## 2014-11-12 ENCOUNTER — Ambulatory Visit: Payer: Self-pay

## 2014-11-12 ENCOUNTER — Inpatient Hospital Stay: Payer: BLUE CROSS/BLUE SHIELD | Attending: Oncology

## 2014-11-12 VITALS — BP 110/74 | HR 89 | Temp 97.3°F | Resp 18 | Wt 195.3 lb

## 2014-11-12 DIAGNOSIS — R110 Nausea: Secondary | ICD-10-CM | POA: Insufficient documentation

## 2014-11-12 DIAGNOSIS — Z170 Estrogen receptor positive status [ER+]: Secondary | ICD-10-CM | POA: Insufficient documentation

## 2014-11-12 DIAGNOSIS — Z8542 Personal history of malignant neoplasm of other parts of uterus: Secondary | ICD-10-CM | POA: Insufficient documentation

## 2014-11-12 DIAGNOSIS — Z87891 Personal history of nicotine dependence: Secondary | ICD-10-CM | POA: Diagnosis not present

## 2014-11-12 DIAGNOSIS — K219 Gastro-esophageal reflux disease without esophagitis: Secondary | ICD-10-CM | POA: Insufficient documentation

## 2014-11-12 DIAGNOSIS — D649 Anemia, unspecified: Secondary | ICD-10-CM | POA: Insufficient documentation

## 2014-11-12 DIAGNOSIS — E669 Obesity, unspecified: Secondary | ICD-10-CM | POA: Insufficient documentation

## 2014-11-12 DIAGNOSIS — R531 Weakness: Secondary | ICD-10-CM

## 2014-11-12 DIAGNOSIS — C50919 Malignant neoplasm of unspecified site of unspecified female breast: Secondary | ICD-10-CM | POA: Insufficient documentation

## 2014-11-12 DIAGNOSIS — G620 Drug-induced polyneuropathy: Secondary | ICD-10-CM | POA: Diagnosis not present

## 2014-11-12 DIAGNOSIS — R5383 Other fatigue: Secondary | ICD-10-CM

## 2014-11-12 DIAGNOSIS — I10 Essential (primary) hypertension: Secondary | ICD-10-CM | POA: Insufficient documentation

## 2014-11-12 DIAGNOSIS — C773 Secondary and unspecified malignant neoplasm of axilla and upper limb lymph nodes: Principal | ICD-10-CM

## 2014-11-12 DIAGNOSIS — K5900 Constipation, unspecified: Secondary | ICD-10-CM | POA: Insufficient documentation

## 2014-11-12 DIAGNOSIS — C50912 Malignant neoplasm of unspecified site of left female breast: Secondary | ICD-10-CM

## 2014-11-12 DIAGNOSIS — T451X5S Adverse effect of antineoplastic and immunosuppressive drugs, sequela: Secondary | ICD-10-CM | POA: Diagnosis not present

## 2014-11-12 DIAGNOSIS — Z79899 Other long term (current) drug therapy: Secondary | ICD-10-CM | POA: Insufficient documentation

## 2014-11-12 DIAGNOSIS — Z800 Family history of malignant neoplasm of digestive organs: Secondary | ICD-10-CM | POA: Insufficient documentation

## 2014-11-12 DIAGNOSIS — R5381 Other malaise: Secondary | ICD-10-CM | POA: Diagnosis not present

## 2014-11-12 DIAGNOSIS — Z803 Family history of malignant neoplasm of breast: Secondary | ICD-10-CM

## 2014-11-12 DIAGNOSIS — Z5111 Encounter for antineoplastic chemotherapy: Secondary | ICD-10-CM | POA: Diagnosis present

## 2014-11-12 DIAGNOSIS — N289 Disorder of kidney and ureter, unspecified: Secondary | ICD-10-CM

## 2014-11-12 LAB — CBC WITH DIFFERENTIAL/PLATELET
Basophils Absolute: 0 10*3/uL (ref 0–0.1)
Basophils Relative: 1 %
Eosinophils Absolute: 0 10*3/uL (ref 0–0.7)
Eosinophils Relative: 1 %
HCT: 30.2 % — ABNORMAL LOW (ref 35.0–47.0)
Hemoglobin: 10.1 g/dL — ABNORMAL LOW (ref 12.0–16.0)
Lymphocytes Relative: 17 %
Lymphs Abs: 0.9 10*3/uL — ABNORMAL LOW (ref 1.0–3.6)
MCH: 29.5 pg (ref 26.0–34.0)
MCHC: 33.5 g/dL (ref 32.0–36.0)
MCV: 88.1 fL (ref 80.0–100.0)
Monocytes Absolute: 0.4 10*3/uL (ref 0.2–0.9)
Monocytes Relative: 7 %
Neutro Abs: 4 10*3/uL (ref 1.4–6.5)
Neutrophils Relative %: 74 %
Platelets: 234 10*3/uL (ref 150–440)
RBC: 3.43 MIL/uL — ABNORMAL LOW (ref 3.80–5.20)
RDW: 18 % — ABNORMAL HIGH (ref 11.5–14.5)
WBC: 5.4 10*3/uL (ref 3.6–11.0)

## 2014-11-12 LAB — BASIC METABOLIC PANEL
Anion gap: 3 — ABNORMAL LOW (ref 5–15)
BUN: 18 mg/dL (ref 6–20)
CO2: 25 mmol/L (ref 22–32)
Calcium: 9.3 mg/dL (ref 8.9–10.3)
Chloride: 108 mmol/L (ref 101–111)
Creatinine, Ser: 1.24 mg/dL — ABNORMAL HIGH (ref 0.44–1.00)
GFR calc Af Amer: 52 mL/min — ABNORMAL LOW (ref >60)
GFR calc non Af Amer: 45 mL/min — ABNORMAL LOW (ref >60)
Glucose, Bld: 106 mg/dL — ABNORMAL HIGH (ref 65–99)
Potassium: 3.6 mmol/L (ref 3.5–5.1)
Sodium: 136 mmol/L (ref 135–145)

## 2014-11-12 MED ORDER — HEPARIN SOD (PORK) LOCK FLUSH 100 UNIT/ML IV SOLN
500.0000 [IU] | Freq: Once | INTRAVENOUS | Status: AC | PRN
  Administered 2014-11-12: 500 [IU]
  Filled 2014-11-12: qty 5

## 2014-11-12 MED ORDER — PACLITAXEL CHEMO INJECTION 300 MG/50ML
72.0000 mg/m2 | Freq: Once | INTRAVENOUS | Status: AC
  Administered 2014-11-12: 144 mg via INTRAVENOUS
  Filled 2014-11-12: qty 24

## 2014-11-12 MED ORDER — DIPHENHYDRAMINE HCL 50 MG/ML IJ SOLN
50.0000 mg | Freq: Once | INTRAMUSCULAR | Status: AC
  Administered 2014-11-12: 50 mg via INTRAVENOUS
  Filled 2014-11-12: qty 1

## 2014-11-12 MED ORDER — SODIUM CHLORIDE 0.9 % IJ SOLN
3.0000 mL | INTRAMUSCULAR | Status: DC | PRN
  Filled 2014-11-12: qty 10

## 2014-11-12 MED ORDER — SODIUM CHLORIDE 0.9 % IJ SOLN
10.0000 mL | INTRAMUSCULAR | Status: DC | PRN
  Administered 2014-11-12: 10 mL
  Filled 2014-11-12: qty 10

## 2014-11-12 MED ORDER — SODIUM CHLORIDE 0.9 % IV SOLN
Freq: Once | INTRAVENOUS | Status: AC
  Administered 2014-11-12: 16:00:00 via INTRAVENOUS
  Filled 2014-11-12: qty 250

## 2014-11-12 MED ORDER — FAMOTIDINE IN NACL 20-0.9 MG/50ML-% IV SOLN
20.0000 mg | Freq: Once | INTRAVENOUS | Status: AC
  Administered 2014-11-12: 20 mg via INTRAVENOUS
  Filled 2014-11-12: qty 50

## 2014-11-12 MED ORDER — SODIUM CHLORIDE 0.9 % IV SOLN
Freq: Once | INTRAVENOUS | Status: AC
  Administered 2014-11-12: 17:00:00 via INTRAVENOUS
  Filled 2014-11-12: qty 4

## 2014-11-12 NOTE — Progress Notes (Signed)
[place]  Telephone:(336) [phone] Fax:(336) [phone]  ID: Norm Salt OB: 09-Nov-1950  MR#: [id number]  AGT#:[id number]  Patient Care Team: Margarita Rana, MD as PCP - General (Family Medicine) [name], MD as Consulting Physician (Oncology) Robert Bellow, MD (General Surgery)  CHIEF COMPLAINT:  Chief Complaint  Patient presents with  . Breast Cancer  . Chemotherapy    patient felt better receiving extra fluids after last treatment  . Nausea    INTERVAL HISTORY: Patient returns to clinic today for further evaluation and consideration of cycle 10 of 12 of weekly Taxol. She has had increased nausea on 4 of the 7 days since her last treatment. She feels increased weakness and fatigue as well. She denies any further dizziness or falls. She has no neurologic complaints. She denies any recent fevers. She denies any pain. She has no chest pain or shortness of breath. She has no urinary complaints. Patient offers no further specific complaints today.    REVIEW OF SYSTEMS:   Review of Systems  Constitutional: Positive for malaise/fatigue.  Gastrointestinal: Positive for nausea.  Neurological: Positive for weakness. Negative for dizziness.    As per HPI. Otherwise, a complete review of systems is negatve.  PAST MEDICAL HISTORY: Past Medical History  Diagnosis Date  . Hypertension   . Back pain   . Obesity   . Breast cancer   . Uterine cancer   . Allergy   . Carpal tunnel syndrome     PAST SURGICAL HISTORY: Past Surgical History  Procedure Laterality Date  . Abdominal hysterectomy    . Breast surgery    . Breast biopsy Left 05/2014    FAMILY HISTORY Family History  Problem Relation Age of Onset  . Breast cancer Sister   . Breast cancer Mother   . Colon cancer Sister   . Cancer Father        ADVANCED DIRECTIVES:    HEALTH MAINTENANCE: History  Substance Use Topics  . Smoking status: Former Smoker -- 0.50 packs/day      Types: Cigarettes  . Smokeless tobacco: Never Used  . Alcohol Use: No     Colonoscopy:  PAP:  Bone density:  Lipid panel:  Allergies  Allergen Reactions  . No Known Allergies     Current Outpatient Prescriptions  Medication Sig Dispense Refill  . amLODipine (NORVASC) 2.5 MG tablet Take 2.5 mg by mouth daily.   0  . fluticasone (FLONASE) 50 MCG/ACT nasal spray Place 1 spray into both nostrils as needed for allergies or rhinitis.    Marland Kitchen gabapentin (NEURONTIN) 300 MG capsule Take 600 mg by mouth 2 (two) times daily.   2  . hydrochlorothiazide (MICROZIDE) 12.5 MG capsule Take 12.5 mg by mouth daily.   1  . meloxicam (MOBIC) 15 MG tablet Take 15 mg by mouth daily.   0  . omeprazole (PRILOSEC) 20 MG capsule Take 40 mg by mouth 2 (two) times daily.    . ondansetron (ZOFRAN-ODT) 4 MG disintegrating tablet Take 4 mg by mouth every 6 (six) hours as needed for nausea or vomiting.    . sertraline (ZOLOFT) 50 MG tablet Take 200 mg by mouth at bedtime.   1  . traMADol (ULTRAM) 50 MG tablet 50 mg every 6 (six) hours as needed.   0  . vitamin B-12 (CYANOCOBALAMIN) 500 MCG tablet Take 500 mcg by mouth daily.     No current facility-administered medications for this visit.   Facility-Administered Medications Ordered in Other  Visits  Medication Dose Route Frequency Provider Last Rate Last Dose  . 0.9 %  sodium chloride infusion   Intravenous Once [name], MD      . diphenhydrAMINE (BENADRYL) injection 50 mg  50 mg Intravenous Once [name], MD      . famotidine (PEPCID) IVPB 20 mg  20 mg Intravenous Once [name], MD      . heparin lock flush 100 unit/mL  500 Units Intracatheter Once PRN [name], MD      . ondansetron (ZOFRAN) 8 mg, dexamethasone (DECADRON) 10 mg in sodium chloride 0.9 % 50 mL IVPB   Intravenous Once [name], MD      . PACLitaxel (TAXOL) 144 mg in dextrose 5 % 250 mL chemo infusion (</= 13m/m2)  72 mg/m2 (Treatment Plan  Actual) Intravenous Once TLloyd Huger MD      . sodium chloride 0.9 % injection 10 mL  10 mL Intracatheter PRN TLloyd Huger MD      . sodium chloride 0.9 % injection 3 mL  3 mL Intravenous PRN TLloyd Huger MD        OBJECTIVE: Filed Vitals:   11/12/14 1536  BP: 110/74  Pulse: 89  Temp: 97.3 F (36.3 C)  Resp: 18     Body mass index is 34.61 kg/(m^2).    ECOG FS:2 - Symptomatic, <50% confined to bed  General: Well-developed, well-nourished, no acute distress. Eyes: anicteric sclera. Breasts: Exam deferred today. Lungs: Clear to auscultation bilaterally. Heart: Regular rate and rhythm. No rubs, murmurs, or gallops. Abdomen: Soft, nontender, nondistended. No organomegaly noted, normoactive bowel sounds. Musculoskeletal: No edema, cyanosis, or clubbing. Neuro: Alert, answering all questions appropriately. Cranial nerves grossly intact. Skin: No rashes or petechiae noted. Psych: Normal affect.    LAB RESULTS:  Lab Results  Component Value Date   NA 137 11/05/2014   K 3.7 11/05/2014   CL 106 11/05/2014   CO2 26 11/05/2014   GLUCOSE 93 11/05/2014   BUN 22* 11/05/2014   CREATININE 1.37* 11/05/2014   CALCIUM 9.2 11/05/2014   PROT 7.4 08/20/2014   ALBUMIN 3.9 08/20/2014   AST 33 08/20/2014   ALT 28 08/20/2014   ALKPHOS 103 08/20/2014   GFRNONAA 41* 11/05/2014   GFRAA 47* 11/05/2014    Lab Results  Component Value Date   WBC 5.4 11/12/2014   NEUTROABS 4.0 11/12/2014   HGB 10.1* 11/12/2014   HCT 30.2* 11/12/2014   MCV 88.1 11/12/2014   PLT 234 11/12/2014     STUDIES: No results found.  ASSESSMENT: Stage IIa ER+ breast cancer with no breast lesion and axillary lymph node metastasis. (TxN1M0)  HER-2 negative.   PLAN:   1. Breast cancer: Despite no obvious breast lesion on mammogram or breast MRI, pathology and pattern of spread is consistent with primary breast cancer. CT and bone scan with no other evidence of malignancy. Will treat patient  as a stage II breast cancer. Previously because of difficulty with treatment, patient only received 3 cycles of Adriamycin and Cytoxan. Proceed with cycle 10 of 12 of weekly Taxol today. This will function as neoadjuvant chemotherapy and will refer patient for axillary node dissection after treatment. Return to clinic in 1 week for consideration of cycle 11.  2. Family history: Patient's sister reports she has positive for Lynch syndrome and we are attempting to confirm these results. Patient may also benefit from genetic testing herself in the near future.  Patient had  a colonoscopy in October 2012 that was reported as normal. 3. Nausea: Patient has been instructed to take her antiemetics on a daily basis. 4. Anemia: Likely secondary to chemotherapy. Improved. Patient received one unit of packed red blood cells on August 07, 2014. 5. Reflux: Continue omeprazole to 40 mg twice daily. 6. Constipation: Continue stool softeners and Miralax as needed. 7. Renal insufficiency: Patient's creatinine is essentially stable, monitor.  Patient expressed understanding and was in agreement with this plan. She also understands that She can call clinic at any time with any questions, concerns, or complaints.   Breast cancer metastasized to axillary lymph node   Staging form: Breast, AJCC 7th Edition     Clinical stage from 10/22/2014: Stage Unknown (TX, N1, M0) - Signed by [name], MD on 10/22/2014   [name], MD   11/12/2014 4:08 PM

## 2014-11-13 ENCOUNTER — Encounter: Payer: Self-pay | Admitting: Pain Medicine

## 2014-11-13 ENCOUNTER — Ambulatory Visit: Payer: BLUE CROSS/BLUE SHIELD | Attending: Pain Medicine | Admitting: Pain Medicine

## 2014-11-13 VITALS — BP 121/100 | HR 86 | Temp 97.8°F | Resp 14 | Ht 63.0 in | Wt 190.0 lb

## 2014-11-13 DIAGNOSIS — M545 Low back pain: Secondary | ICD-10-CM | POA: Diagnosis present

## 2014-11-13 DIAGNOSIS — M79604 Pain in right leg: Secondary | ICD-10-CM | POA: Insufficient documentation

## 2014-11-13 DIAGNOSIS — M79605 Pain in left leg: Secondary | ICD-10-CM | POA: Diagnosis present

## 2014-11-13 DIAGNOSIS — M5137 Other intervertebral disc degeneration, lumbosacral region: Secondary | ICD-10-CM | POA: Insufficient documentation

## 2014-11-13 DIAGNOSIS — C50919 Malignant neoplasm of unspecified site of unspecified female breast: Secondary | ICD-10-CM | POA: Insufficient documentation

## 2014-11-13 DIAGNOSIS — M4806 Spinal stenosis, lumbar region: Secondary | ICD-10-CM | POA: Diagnosis not present

## 2014-11-13 DIAGNOSIS — M47896 Other spondylosis, lumbar region: Secondary | ICD-10-CM | POA: Diagnosis not present

## 2014-11-13 DIAGNOSIS — M51379 Other intervertebral disc degeneration, lumbosacral region without mention of lumbar back pain or lower extremity pain: Secondary | ICD-10-CM | POA: Insufficient documentation

## 2014-11-13 MED ORDER — GABAPENTIN 300 MG PO CAPS: ORAL_CAPSULE | ORAL | Status: DC

## 2014-11-13 MED ORDER — TRAMADOL HCL 50 MG PO TABS: ORAL_TABLET | ORAL | Status: DC

## 2014-11-13 NOTE — Progress Notes (Signed)
   Subjective:    Patient ID: Carrie Alvarez, female    DOB: 01-Oct-1950, 64 y.o.   MRN: [id number]  HPI  Patient is a 64 year old female returns to pain management Center for follow-up evaluation of lumbar and lower extremity pain. Patient states that pain is well controlled at this time with the present medication. We will avoid interventional treatment while patient continues treatment for carcinoma of the breast. As explained to patient we will proceed with interventional treatment including lumbar epidural steroid injection should Dr. Grayland Ormond state that patient is able to undergo lumbar epidural steroid injection and consideration of patient's treatment for carcinoma of the breast. We will await decision of Dr. Grayland Ormond and patient regarding patient undergoing lumbar epidural steroid injection and we'll proceed with such if Dr. Grayland Ormond feels that patient can tolerate procedure.  Review of Systems     Objective:   Physical Exam Physical examination revealed tenderness to palpation of paraspinal musculature in the cervical region cervical facet region of mild degree. Patient was with bilaterally equal grip strength reevaluation revealed questionably decreased grip strength. There was tenderness of the thoracic facet region of minimal degree. No crepitus of the thoracic region. Rotation and extension with palpation over the lumbar facets reproduced significant pain. There was tenderness to palpation of the PSIS NP IIS regions of moderate degree. There was a limited straight leg raising without sensory deficit of dermatomal distribution detected. Minimal tenderness of the greater trochanteric region noted no abdominal tenderness to palpation no costovertebral angle tenderness noted       Assessment & Plan:  Assessment patient with lumbar and lower extremity pain felt to be due to multilevel degenerative changes of the lumbar spine with some findings suggestive of neurogenic claudication.  Patient with MRI evidence of multilevel degenerative changes of the lumbar spine with stenosis may benefit from lumbar epidural steroid injection. We'll proceed with lumbar epidural steroid injection pending medical clearance by Dr. Grayland Ormond in lieu of patient's carcinoma of the breast with treatment for carcinoma the breasts and process at this time.  Plan #1 we'll continue tramadol and Neurontin Plan #2 will consider lumbar epidural steroid injection should Dr. Grayland Ormond clear patient for procedure and consideration of patient undergoing treatment for carcinoma of the breast Plan #3 patient will follow up with primary care physician as discussed Plan #4 patient to call pain management Center prior to scheduled return appointment should they be change in condition

## 2014-11-13 NOTE — Patient Instructions (Addendum)
Pt to discuss bp with Dr Venia Minks  as we discussed     patient  to discuss lesi with Dr Grayland Ormond and we will proceed with lesi if ok with Dr Grayland Ormond  pts meds due 12/13/14 discharged at 1406pm scrispt of tramadol given .

## 2014-11-14 NOTE — Addendum Note (Signed)
Addended by: Morley Kos on: 11/14/2014 04:10 PM   Modules accepted: Level of Service

## 2014-11-15 ENCOUNTER — Other Ambulatory Visit: Payer: Self-pay | Admitting: Oncology

## 2014-11-17 ENCOUNTER — Other Ambulatory Visit: Payer: Self-pay | Admitting: Oncology

## 2014-11-19 ENCOUNTER — Inpatient Hospital Stay: Payer: BLUE CROSS/BLUE SHIELD

## 2014-11-19 ENCOUNTER — Inpatient Hospital Stay (HOSPITAL_BASED_OUTPATIENT_CLINIC_OR_DEPARTMENT_OTHER): Payer: BLUE CROSS/BLUE SHIELD | Admitting: Oncology

## 2014-11-19 VITALS — BP 145/98 | HR 79 | Temp 96.0°F | Resp 18 | Wt 194.4 lb

## 2014-11-19 VITALS — BP 152/103 | HR 80 | Resp 18

## 2014-11-19 DIAGNOSIS — Z5111 Encounter for antineoplastic chemotherapy: Secondary | ICD-10-CM | POA: Diagnosis not present

## 2014-11-19 DIAGNOSIS — K219 Gastro-esophageal reflux disease without esophagitis: Secondary | ICD-10-CM

## 2014-11-19 DIAGNOSIS — T451X5S Adverse effect of antineoplastic and immunosuppressive drugs, sequela: Secondary | ICD-10-CM

## 2014-11-19 DIAGNOSIS — C50912 Malignant neoplasm of unspecified site of left female breast: Secondary | ICD-10-CM

## 2014-11-19 DIAGNOSIS — R110 Nausea: Secondary | ICD-10-CM

## 2014-11-19 DIAGNOSIS — D649 Anemia, unspecified: Secondary | ICD-10-CM | POA: Diagnosis not present

## 2014-11-19 DIAGNOSIS — C773 Secondary and unspecified malignant neoplasm of axilla and upper limb lymph nodes: Secondary | ICD-10-CM | POA: Diagnosis not present

## 2014-11-19 DIAGNOSIS — Z803 Family history of malignant neoplasm of breast: Secondary | ICD-10-CM

## 2014-11-19 DIAGNOSIS — R531 Weakness: Secondary | ICD-10-CM

## 2014-11-19 DIAGNOSIS — G620 Drug-induced polyneuropathy: Secondary | ICD-10-CM

## 2014-11-19 DIAGNOSIS — R5383 Other fatigue: Secondary | ICD-10-CM

## 2014-11-19 DIAGNOSIS — Z79899 Other long term (current) drug therapy: Secondary | ICD-10-CM

## 2014-11-19 DIAGNOSIS — I10 Essential (primary) hypertension: Secondary | ICD-10-CM

## 2014-11-19 LAB — BASIC METABOLIC PANEL
Anion gap: 5 (ref 5–15)
BUN: 18 mg/dL (ref 6–20)
CO2: 25 mmol/L (ref 22–32)
Calcium: 9.1 mg/dL (ref 8.9–10.3)
Chloride: 108 mmol/L (ref 101–111)
Creatinine, Ser: 1.03 mg/dL — ABNORMAL HIGH (ref 0.44–1.00)
GFR calc Af Amer: >60 mL/min (ref >60)
GFR calc non Af Amer: 56 mL/min — ABNORMAL LOW (ref >60)
Glucose, Bld: 83 mg/dL (ref 65–99)
Potassium: 3.9 mmol/L (ref 3.5–5.1)
Sodium: 138 mmol/L (ref 135–145)

## 2014-11-19 LAB — CBC WITH DIFFERENTIAL/PLATELET
Basophils Absolute: 0.1 10*3/uL (ref 0–0.1)
Basophils Relative: 1 %
Eosinophils Absolute: 0 10*3/uL (ref 0–0.7)
Eosinophils Relative: 1 %
HCT: 31 % — ABNORMAL LOW (ref 35.0–47.0)
Hemoglobin: 10.1 g/dL — ABNORMAL LOW (ref 12.0–16.0)
Lymphocytes Relative: 20 %
Lymphs Abs: 1.1 10*3/uL (ref 1.0–3.6)
MCH: 28.7 pg (ref 26.0–34.0)
MCHC: 32.5 g/dL (ref 32.0–36.0)
MCV: 88.4 fL (ref 80.0–100.0)
Monocytes Absolute: 0.5 10*3/uL (ref 0.2–0.9)
Monocytes Relative: 9 %
Neutro Abs: 3.8 10*3/uL (ref 1.4–6.5)
Neutrophils Relative %: 69 %
Platelets: 244 10*3/uL (ref 150–440)
RBC: 3.51 MIL/uL — ABNORMAL LOW (ref 3.80–5.20)
RDW: 17 % — ABNORMAL HIGH (ref 11.5–14.5)
WBC: 5.5 10*3/uL (ref 3.6–11.0)

## 2014-11-19 MED ORDER — DIPHENHYDRAMINE HCL 50 MG/ML IJ SOLN
25.0000 mg | Freq: Once | INTRAMUSCULAR | Status: AC
Start: 2014-11-19 — End: 2014-11-19
  Administered 2014-11-19: 25 mg via INTRAVENOUS
  Filled 2014-11-19: qty 1

## 2014-11-19 MED ORDER — SODIUM CHLORIDE 0.9 % IJ SOLN
10.0000 mL | INTRAMUSCULAR | Status: DC | PRN
  Filled 2014-11-19: qty 10

## 2014-11-19 MED ORDER — FAMOTIDINE IN NACL 20-0.9 MG/50ML-% IV SOLN
20.0000 mg | Freq: Once | INTRAVENOUS | Status: AC
  Administered 2014-11-19: 20 mg via INTRAVENOUS
  Filled 2014-11-19: qty 50

## 2014-11-19 MED ORDER — LIDOCAINE-PRILOCAINE 2.5-2.5 % EX CREA: 1.0000 "application " | TOPICAL_CREAM | CUTANEOUS | Status: DC | PRN

## 2014-11-19 MED ORDER — SODIUM CHLORIDE 0.9 % IV SOLN
Freq: Once | INTRAVENOUS | Status: AC
  Administered 2014-11-19: 16:00:00 via INTRAVENOUS
  Filled 2014-11-19: qty 250

## 2014-11-19 MED ORDER — DEXTROSE 5 % IV SOLN
72.0000 mg/m2 | Freq: Once | INTRAVENOUS | Status: AC
  Administered 2014-11-19: 144 mg via INTRAVENOUS
  Filled 2014-11-19: qty 24

## 2014-11-19 MED ORDER — HEPARIN SOD (PORK) LOCK FLUSH 100 UNIT/ML IV SOLN
500.0000 [IU] | Freq: Once | INTRAVENOUS | Status: AC
  Administered 2014-11-19: 500 [IU] via INTRAVENOUS
  Filled 2014-11-19: qty 5

## 2014-11-19 MED ORDER — SODIUM CHLORIDE 0.9 % IV SOLN
Freq: Once | INTRAVENOUS | Status: AC
  Administered 2014-11-19: 16:00:00 via INTRAVENOUS
  Filled 2014-11-19: qty 4

## 2014-11-23 NOTE — Progress Notes (Signed)
[place]  [phone] [phone]) [phone]  ID: Carrie Alvarez OB: 1951-01-22  MR#: [id number]  IOE#:[id number]  Patient Care Team: Margarita Rana, MD as PCP - General (Family Medicine) [name], MD as Consulting Physician (Oncology) Robert Bellow, MD (General Surgery)  CHIEF COMPLAINT:  Chief Complaint  Patient presents with  . Follow-up    breast cancer   . Chemotherapy    INTERVAL HISTORY: Patient returns to clinic today for further evaluation and consideration of cycle 12 of 12 of weekly Taxol. She continues to have nausea almost on a daily basis. She also complains of a mild peripheral neuropathy. She feels increased weakness and fatigue as well. She denies any further dizziness or falls. She has no other neurologic complaints. She denies any recent fevers. She denies any pain. She has no chest pain or shortness of breath. She has no urinary complaints. Patient offers no further specific complaints today.    REVIEW OF SYSTEMS:   Review of Systems  Constitutional: Positive for malaise/fatigue.  Respiratory: Positive for shortness of breath.   Cardiovascular: Negative.   Gastrointestinal: Positive for nausea.  Neurological: Positive for sensory change and weakness. Negative for dizziness.    As per HPI. Otherwise, a complete review of systems is negatve.  PAST MEDICAL HISTORY: Past Medical History  Diagnosis Date  . Hypertension   . Back pain   . Obesity   . Breast cancer   . Uterine cancer   . Allergy   . Carpal tunnel syndrome     PAST SURGICAL HISTORY: Past Surgical History  Procedure Laterality Date  . Abdominal hysterectomy    . Breast surgery    . Breast biopsy Left 05/2014    FAMILY HISTORY Family History  Problem Relation Age of Onset  . Breast cancer Sister   . Colon cancer Mother   . Hypertension Mother   . Asthma Mother   . Cancer Father        ADVANCED DIRECTIVES:    HEALTH  MAINTENANCE: History  Substance Use Topics  . Smoking status: Former Smoker -- 0.50 packs/day    Types: Cigarettes  . Smokeless tobacco: Never Used     Comment: given smoking and pain information   . Alcohol Use: No     Colonoscopy:  PAP:  Bone density:  Lipid panel:  Allergies  Allergen Reactions  . No Known Allergies     Current Outpatient Prescriptions  Medication Sig Dispense Refill  . amLODipine (NORVASC) 2.5 MG tablet Take 2.5 mg by mouth daily.   0  . fluticasone (FLONASE) 50 MCG/ACT nasal spray Place 1 spray into both nostrils as needed for allergies or rhinitis.    Marland Kitchen gabapentin (NEURONTIN) 300 MG capsule Limit 1 - 2 tabs po / day  or bid if tolerated 120 capsule 2  . hydrochlorothiazide (MICROZIDE) 12.5 MG capsule Take 12.5 mg by mouth daily.   1  . meloxicam (MOBIC) 15 MG tablet Take 15 mg by mouth daily.   0  . omeprazole (PRILOSEC) 20 MG capsule Take 40 mg by mouth 2 (two) times daily.    . ondansetron (ZOFRAN-ODT) 4 MG disintegrating tablet Take 4 mg by mouth every 6 (six) hours as needed for nausea or vomiting.    . sertraline (ZOLOFT) 50 MG tablet Take 200 mg by mouth at bedtime.   1  . traMADol (ULTRAM) 50 MG tablet Limit 1 tab po 3-6 times daily if tolerated 180 tablet 0  . vitamin B-12 (  CYANOCOBALAMIN) 500 MCG tablet Take 500 mcg by mouth daily.    Marland Kitchen lidocaine-prilocaine (EMLA) cream Apply 1 application topically as needed. Apply to port then cover with saran wrap 1-2 hours prior to chemotherapy appointment 30 g 1   No current facility-administered medications for this visit.   Facility-Administered Medications Ordered in Other Visits  Medication Dose Route Frequency Provider Last Rate Last Dose  . sodium chloride 0.9 % injection 10 mL  10 mL Intracatheter PRN [name], MD   10 mL at 11/12/14 1616  . sodium chloride 0.9 % injection 3 mL  3 mL Intravenous PRN [name], MD   3 mL at 11/12/14 1828    OBJECTIVE: Filed Vitals:   11/19/14  1448  BP: 145/98  Pulse: 79  Temp: 96 F (35.6 C)  Resp: 18     Body mass index is 34.45 kg/(m^2).    ECOG FS:2 - Symptomatic, <50% confined to bed  General: Well-developed, well-nourished, no acute distress. Eyes: anicteric sclera. Breasts: Exam deferred today. Lungs: Clear to auscultation bilaterally. Heart: Regular rate and rhythm. No rubs, murmurs, or gallops. Abdomen: Soft, nontender, nondistended. No organomegaly noted, normoactive bowel sounds. Musculoskeletal: No edema, cyanosis, or clubbing. Neuro: Alert, answering all questions appropriately. Cranial nerves grossly intact. Skin: No rashes or petechiae noted. Psych: Normal affect.    LAB RESULTS:  Lab Results  Component Value Date   NA 138 11/19/2014   K 3.9 11/19/2014   CL 108 11/19/2014   CO2 25 11/19/2014   GLUCOSE 83 11/19/2014   BUN 18 11/19/2014   CREATININE 1.03* 11/19/2014   CALCIUM 9.1 11/19/2014   PROT 7.4 08/20/2014   ALBUMIN 3.9 08/20/2014   AST 33 08/20/2014   ALT 28 08/20/2014   ALKPHOS 103 08/20/2014   GFRNONAA 56* 11/19/2014   GFRAA >60 11/19/2014    Lab Results  Component Value Date   WBC 5.5 11/19/2014   NEUTROABS 3.8 11/19/2014   HGB 10.1* 11/19/2014   HCT 31.0* 11/19/2014   MCV 88.4 11/19/2014   PLT 244 11/19/2014     STUDIES: No results found.  ASSESSMENT: Stage IIa ER+ breast cancer with no breast lesion and axillary lymph node metastasis. (TxN1M0)  HER-2 negative.   PLAN:   1. Breast cancer: Despite no obvious breast lesion on mammogram or breast MRI, pathology and pattern of spread is consistent with primary breast cancer. CT and bone scan with no other evidence of malignancy. Will treat patient as a stage II breast cancer. Previously because of difficulty with treatment, patient only received 3 cycles of Adriamycin and Cytoxan. Proceed with cycle 12 of 12 of weekly Taxol today. This will function as neoadjuvant chemotherapy and will refer patient for axillary node  dissection after treatment. Return to clinic in weeks postoperatively for further evaluation and consideration of adjuvant XRT. Patient will also benefit from an aromatase inhibitor at the completion of her treatments. 2. Family history: Patient's sister reports she has positive for Lynch syndrome and we are attempting to confirm these results. Patient may also benefit from genetic testing herself in the near future.  Patient had a colonoscopy in October 2012 that was reported as normal. 3. Nausea: Patient has been instructed to take her antiemetics on a daily basis. 4. Anemia: Likely secondary to chemotherapy. Improved. Patient received one unit of packed red blood cells on August 07, 2014. 5. Reflux: Continue omeprazole to 40 mg twice daily. 6. Constipation: Continue stool softeners and Miralax as needed. 7. Renal insufficiency:  Resolved. 8. 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 Monitor.  Patient expressed understanding and was in agreement with this plan. She also understands that She can call clinic at any time with any questions, concerns, or complaints.   Breast cancer metastasized to axillary lymph node   Staging form: Breast, AJCC 7th Edition     Clinical stage from 10/22/2014: Stage Unknown (TX, N1, M0) - Signed by [name], MD on 10/22/2014   [name], MD   11/23/2014 10:01 AM

## 2014-12-03 ENCOUNTER — Encounter: Payer: Self-pay | Admitting: General Surgery

## 2014-12-03 ENCOUNTER — Ambulatory Visit (INDEPENDENT_AMBULATORY_CARE_PROVIDER_SITE_OTHER): Payer: BLUE CROSS/BLUE SHIELD | Admitting: General Surgery

## 2014-12-03 ENCOUNTER — Ambulatory Visit: Payer: Self-pay | Admitting: General Surgery

## 2014-12-03 VITALS — BP 128/80 | HR 74 | Resp 13 | Ht 63.0 in | Wt 195.0 lb

## 2014-12-03 DIAGNOSIS — C773 Secondary and unspecified malignant neoplasm of axilla and upper limb lymph nodes: Secondary | ICD-10-CM | POA: Diagnosis not present

## 2014-12-03 DIAGNOSIS — C50912 Malignant neoplasm of unspecified site of left female breast: Secondary | ICD-10-CM

## 2014-12-03 NOTE — Patient Instructions (Addendum)
Sentinel Lymph Node Biopsy in Breast Cancer Treatment Sentinel lymph node biopsy is a procedure to identify, remove, and examine one or more lymph nodes for cancer. Lymph nodes are collections of tissue that filter infections, cancer cells, and other waste substances from the bloodstream. Cancer can spread to nearby lymph nodes. It usually spreads to one lymph node first, and then it spreads to others. The first lymph node that the cancer could spread to is called the sentinel lymph node. In some cases, there may be more than one sentinel lymph node. If you have breast cancer, you may have this procedure to determine whether your cancer has spread. For breast cancer, a sentinel lymph node is usually in your armpit because this is where breast cancer tends to spread first. If no cancer is found in a sentinel lymph node, it is very unlikely that the cancer has spread to any of the other lymph nodes. If cancer is found in a sentinel lymph node, your surgeon may remove additional lymph nodes for examination. LET [facility] CARE PROVIDER KNOW ABOUT:  Any allergies you have.  All medicines you are taking, including vitamins, herbs, eye drops, creams, and over-the-counter medicines.  Previous problems you or members of your family have had with the use of anesthetics.  Any blood disorders you have.  Previous surgeries you have had.  Medical conditions you have. RISKS AND COMPLICATIONS Generally, this is a safe procedure. However, problems can occur and include:  Infection.  Bleeding.  Allergic reaction to the dye used for the procedure.  Staining of the skin where the dye is injected.  Damaged lymph vessels, causing a buildup of fluid (lymphedema).  Pain or bruising at the biopsy site. BEFORE THE PROCEDURE  If you smoke, stop smoking at least 2 weeks before the procedure. This will improve your health after the procedure and reduce your risk of getting a wound infection.  You may have  blood tests to make sure your blood clots normally.  Ask your health care provider about changing or stopping your regular medicines. This is especially important if you are taking diabetes medicines or blood thinners.  Do not eat or drink anything after midnight on the night before the procedure or as directed by your health care provider if you will be having the type of medicine that makes you fall asleep (general anesthetic).  Plan to have someone take you home after the procedure. PROCEDURE  Sentinel node biopsy may be done as an outpatient procedure.  You may be given a general anesthetic or a medicine that numbs only the surgical area (local anesthetic).  Blue dye or a radioactive substance or both will be injected around the tumor in your breast.  The blue dye will reach your lymph node quickly. It may be given just before surgery.  The radioactive substance will take longer to reach your lymph nodes, so it may be given before you go into the operating area. Both the dye and the radioactive substance will follow the same path that a spreading cancer would be likely to follow.  If a radioactive substance is to be injected,a scanner will show where the substance has spread to help identify the sentinel lymph node.   The surgeon will make a small cut (incision). If blue dye is to be injected, your surgeon will look for any lymph nodes that have picked up the dye.  Sentinel lymph nodes will be removed and sent to a lab for examination.  If no  cancer is found, no other lymph nodes will be removed. This means it is unlikely the cancer has spread to other lymph nodes.  If cancer is found, the surgeon will remove other lymph nodes in the armpit for examination. This may happen during the same procedure or at a later time.  The incision will be closed with stitches or metal clips.  Small adhesive bandages may be used to keep the skin edges close together.  A small dressing may be  taped over the incision area. AFTER THE PROCEDURE  You will be monitored in a recovery room.  Your urine may be blue for the next 24 hours. This is normal. It is caused by the dye used during the procedure.  Your skin at the injection site may be blue for up to 8 weeks.  You may feel numbness, tingling, or pain near your surgical cut (incision).  You may have swelling or bruising near your incision. Document Released: 06/29/2005 Document Revised: 11/13/2013 Document Reviewed: 04/25/2013 [facility] Patient Information 2015 [place], [place]. This information is not intended to replace advice given to you by your health care provider. Make sure you discuss any questions you have with your health care provider.  Patient's surgery has been scheduled for 12-18-14 at [facility].

## 2014-12-03 NOTE — Progress Notes (Signed)
Patient ID: Carrie Alvarez, female   DOB: 06-15-1951, 64 y.o.   MRN: [id number]  Chief Complaint  Patient presents with  . Other    left lymph node    HPI Carrie Alvarez is a 64 y.o. female for evaluation for surgical excision of left axillary lymph node. She just finished neo-adjuvant chemo therapy 2 weeks ago for left breast cancer with an unknown primary. She had presented with axillary nodal disease, and no identifiable lesion on breast MRI or other imaging modalities. She states that she is having no new breast problems.  HPI  Past Medical History  Diagnosis Date  . Hypertension   . Back pain   . Obesity   . Breast cancer   . Uterine cancer   . Allergy   . Carpal tunnel syndrome     Past Surgical History  Procedure Laterality Date  . Abdominal hysterectomy    . Breast surgery    . Breast biopsy Left 05/2014    Family History  Problem Relation Age of Onset  . Breast cancer Sister   . Colon cancer Mother   . Hypertension Mother   . Asthma Mother   . Cancer Father     Social History History  Substance Use Topics  . Smoking status: Former Smoker -- 0.50 packs/day    Types: Cigarettes  . Smokeless tobacco: Never Used     Comment: given smoking and pain information   . Alcohol Use: No    Allergies  Allergen Reactions  . No Known Allergies     Current Outpatient Prescriptions  Medication Sig Dispense Refill  . amLODipine (NORVASC) 2.5 MG tablet Take 2.5 mg by mouth daily.   0  . fluticasone (FLONASE) 50 MCG/ACT nasal spray Place 1 spray into both nostrils as needed for allergies or rhinitis.    Marland Kitchen gabapentin (NEURONTIN) 300 MG capsule Limit 1 - 2 tabs po / day  or bid if tolerated 120 capsule 2  . lidocaine-prilocaine (EMLA) cream Apply 1 application topically as needed. Apply to port then cover with saran wrap 1-2 hours prior to chemotherapy appointment 30 g 1  . meloxicam (MOBIC) 15 MG tablet Take 15 mg by mouth daily.   0  . omeprazole  (PRILOSEC) 20 MG capsule Take 40 mg by mouth 2 (two) times daily.    . sertraline (ZOLOFT) 50 MG tablet Take 200 mg by mouth at bedtime.   1  . traMADol (ULTRAM) 50 MG tablet Limit 1 tab po 3-6 times daily if tolerated 180 tablet 0  . vitamin B-12 (CYANOCOBALAMIN) 500 MCG tablet Take 500 mcg by mouth daily.     No current facility-administered medications for this visit.   Facility-Administered Medications Ordered in Other Visits  Medication Dose Route Frequency Provider Last Rate Last Dose  . sodium chloride 0.9 % injection 10 mL  10 mL Intracatheter PRN [name], MD   10 mL at 11/12/14 1616  . sodium chloride 0.9 % injection 3 mL  3 mL Intravenous PRN [name], MD   3 mL at 11/12/14 1828    Review of Systems Review of Systems  Blood pressure 128/80, pulse 74, resp. rate 13, height 5\' 3"  (1.6 m), weight 195 lb (88.451 kg).  Physical Exam Physical Exam  Constitutional: She is oriented to person, place, and time. She appears well-developed and well-nourished.  Eyes: Conjunctivae are normal. No scleral icterus.  Neck: Neck supple.  Cardiovascular: Normal rate, regular rhythm and normal heart sounds.  Pulmonary/Chest: Effort normal and breath sounds normal. Right breast exhibits no inverted nipple, no mass, no nipple discharge, no skin change and no tenderness. Left breast exhibits no inverted nipple, no mass, no nipple discharge, no skin change and no tenderness.  Musculoskeletal:       Arms: Lymphadenopathy:    She has no cervical adenopathy.    She has no axillary adenopathy.      Neurological: She is alert and oriented to person, place, and time.  Skin: Skin is warm and dry.    Data Reviewed Phone conversation with [name], M.D. The patient completed 3 cycles of Adriamycin/Cytoxan, 12 cycles of Taxol.  Assessment    Axillary metastatic disease from a breast cancer of unknown primary.    Plan    Plans at present are for sentinel node biopsy  and axillary dissection only if residual positive nodes are identified. The patient already has lymphedema, and an unnecessary axillary dissection with planned whole breast and axillary radiation post surgery would put her at a very high-risk for worsening lymphedema. This is acceptable to Dr. Grayland Ormond. The procedure was reviewed in detail with the patient.  We'll postpone surgery for about 10 days to make her 3 weeks out from her last Taxol therapy.  Patient's surgery has been scheduled for 12-18-14 at [facility].    Ref: Dr Grayland Ormond PCP: Edison Nasuti   Robert Bellow 12/04/2014, 12:26 PM

## 2014-12-04 ENCOUNTER — Other Ambulatory Visit: Payer: Self-pay | Admitting: General Surgery

## 2014-12-04 NOTE — H&P (Signed)
Patient ID: Carrie Alvarez, female   DOB: 1950-10-02, 64 y.o.   MRN: [id number]    Chief Complaint   Patient presents with   .  Other       left lymph node      HPI Carrie Alvarez is a 64 y.o. female for evaluation for surgical excision of left axillary lymph node. She just finished neo-adjuvant chemo therapy 2 weeks ago for left breast cancer with an unknown primary. She had presented with axillary nodal disease, and no identifiable lesion on breast MRI or other imaging modalities. She states that she is having no new breast problems.  HPI    Past Medical History   Diagnosis  Date   .  Hypertension     .  Back pain     .  Obesity     .  Breast cancer     .  Uterine cancer     .  Allergy     .  Carpal tunnel syndrome         Past Surgical History   Procedure  Laterality  Date   .  Abdominal hysterectomy       .  Breast surgery       .  Breast biopsy  Left  05/2014       Family History   Problem  Relation  Age of Onset   .  Breast cancer  Sister     .  Colon cancer  Mother     .  Hypertension  Mother     .  Asthma  Mother     .  Cancer  Father        Social History History   Substance Use Topics   .  Smoking status:  Former Smoker -- 0.50 packs/day       Types:  Cigarettes   .  Smokeless tobacco:  Never Used         Comment: given smoking and pain information    .  Alcohol Use:  No       Allergies   Allergen  Reactions   .  No Known Allergies         Current Outpatient Prescriptions   Medication  Sig  Dispense  Refill   .  amLODipine (NORVASC) 2.5 MG tablet  Take 2.5 mg by mouth daily.     0   .  fluticasone (FLONASE) 50 MCG/ACT nasal spray  Place 1 spray into both nostrils as needed for allergies or rhinitis.       Marland Kitchen  gabapentin (NEURONTIN) 300 MG capsule  Limit 1 - 2 tabs po / day  or bid if tolerated  120 capsule  2   .  lidocaine-prilocaine (EMLA) cream  Apply 1 application topically as needed. Apply to port then cover with saran wrap 1-2  hours prior to chemotherapy appointment  30 g  1   .  meloxicam (MOBIC) 15 MG tablet  Take 15 mg by mouth daily.     0   .  omeprazole (PRILOSEC) 20 MG capsule  Take 40 mg by mouth 2 (two) times daily.       .  sertraline (ZOLOFT) 50 MG tablet  Take 200 mg by mouth at bedtime.     1   .  traMADol (ULTRAM) 50 MG tablet  Limit 1 tab po 3-6 times daily if tolerated  180 tablet  0   .  vitamin B-12 (CYANOCOBALAMIN) 500 MCG  tablet  Take 500 mcg by mouth daily.           No current facility-administered medications for this visit.       Facility-Administered Medications Ordered in Other Visits   Medication  Dose  Route  Frequency  Provider  Last Rate  Last Dose   .  sodium chloride 0.9 % injection 10 mL   10 mL  Intracatheter  PRN  [name], MD     10 mL at 11/12/14 1616   .  sodium chloride 0.9 % injection 3 mL   3 mL  Intravenous  PRN  [name], MD     3 mL at 11/12/14 1828      Review of Systems Review of Systems   Blood pressure 128/80, pulse 74, resp. rate 13, height 5\' 3"  (1.6 m), weight 195 lb (88.451 kg).   Physical Exam Physical Exam  Constitutional: She is oriented to person, place, and time. She appears well-developed and well-nourished.  Eyes: Conjunctivae are normal. No scleral icterus.  Neck: Neck supple.  Cardiovascular: Normal rate, regular rhythm and normal heart sounds.   Pulmonary/Chest: Effort normal and breath sounds normal. Right breast exhibits no inverted nipple, no mass, no nipple discharge, no skin change and no tenderness. Left breast exhibits no inverted nipple, no mass, no nipple discharge, no skin change and no tenderness.  Musculoskeletal:       Arms:graphic Lymphadenopathy:    She has no cervical adenopathy.    She has no axillary adenopathy.     Neurological: She is alert and oriented to person, place, and time.  Skin: Skin is warm and dry.    Data Reviewed Phone conversation with [name], M.D. The patient completed  3 cycles of Adriamycin/Cytoxan, 12 cycles of Taxol.   Assessment Axillary metastatic disease from a breast cancer of unknown primary.   Plan   Plans at present are for sentinel node biopsy and axillary dissection only if residual positive nodes are identified. The patient already has lymphedema, and an unnecessary axillary dissection with planned whole breast and axillary radiation post surgery would put her at a very high-risk for worsening lymphedema. This is acceptable to Dr. Grayland Ormond. The procedure was reviewed in detail with the patient.   We'll postpone surgery for about 10 days to make her 3 weeks out from her last Taxol therapy.   Patient's surgery has been scheduled for 12-18-14 at [facility]. Ref: Dr Grayland Ormond PCP: Edison Nasuti     Robert Bellow 12/04/2014, 12:26 PM

## 2014-12-05 ENCOUNTER — Encounter: Payer: Self-pay | Admitting: General Surgery

## 2014-12-10 ENCOUNTER — Other Ambulatory Visit: Payer: Self-pay | Admitting: Pain Medicine

## 2014-12-10 DIAGNOSIS — M533 Sacrococcygeal disorders, not elsewhere classified: Secondary | ICD-10-CM | POA: Insufficient documentation

## 2014-12-10 DIAGNOSIS — M5137 Other intervertebral disc degeneration, lumbosacral region: Secondary | ICD-10-CM

## 2014-12-10 DIAGNOSIS — M47817 Spondylosis without myelopathy or radiculopathy, lumbosacral region: Secondary | ICD-10-CM | POA: Insufficient documentation

## 2014-12-12 HISTORY — PX: BREAST LUMPECTOMY: SHX2

## 2014-12-13 ENCOUNTER — Encounter: Payer: BLUE CROSS/BLUE SHIELD | Admitting: Pain Medicine

## 2014-12-17 ENCOUNTER — Inpatient Hospital Stay: Admission: RE | Admit: 2014-12-17 | Payer: BLUE CROSS/BLUE SHIELD | Source: Ambulatory Visit

## 2014-12-17 ENCOUNTER — Encounter: Payer: Self-pay | Admitting: *Deleted

## 2014-12-17 DIAGNOSIS — R591 Generalized enlarged lymph nodes: Secondary | ICD-10-CM | POA: Diagnosis present

## 2014-12-17 DIAGNOSIS — E669 Obesity, unspecified: Secondary | ICD-10-CM | POA: Diagnosis not present

## 2014-12-17 DIAGNOSIS — Z800 Family history of malignant neoplasm of digestive organs: Secondary | ICD-10-CM | POA: Diagnosis not present

## 2014-12-17 DIAGNOSIS — C50912 Malignant neoplasm of unspecified site of left female breast: Secondary | ICD-10-CM | POA: Diagnosis not present

## 2014-12-17 DIAGNOSIS — Z7951 Long term (current) use of inhaled steroids: Secondary | ICD-10-CM | POA: Diagnosis not present

## 2014-12-17 DIAGNOSIS — Z87891 Personal history of nicotine dependence: Secondary | ICD-10-CM | POA: Diagnosis not present

## 2014-12-17 DIAGNOSIS — Z79899 Other long term (current) drug therapy: Secondary | ICD-10-CM | POA: Diagnosis not present

## 2014-12-17 DIAGNOSIS — I10 Essential (primary) hypertension: Secondary | ICD-10-CM | POA: Diagnosis not present

## 2014-12-17 DIAGNOSIS — Z803 Family history of malignant neoplasm of breast: Secondary | ICD-10-CM | POA: Diagnosis not present

## 2014-12-17 DIAGNOSIS — Z791 Long term (current) use of non-steroidal anti-inflammatories (NSAID): Secondary | ICD-10-CM | POA: Diagnosis not present

## 2014-12-17 DIAGNOSIS — Z8542 Personal history of malignant neoplasm of other parts of uterus: Secondary | ICD-10-CM | POA: Diagnosis not present

## 2014-12-17 NOTE — Patient Instructions (Signed)
  Your procedure is scheduled on: 12-17-14 Report to [facility] [place]  To find out your arrival time please call [phone] between 1PM - 3PM on N/A  Remember: Instructions that are not followed completely may result in serious medical risk, up to and including death, or upon the discretion of your surgeon and anesthesiologist your surgery may need to be rescheduled.    _X___ 1. Do not eat food or drink liquids after midnight. No gum chewing or hard candies.     _X___ 2. No Alcohol for 24 hours before or after surgery.   ____ 3. Bring all medications with you on the day of surgery if instructed.    ____ 4. Notify your doctor if there is any change in your medical condition     (cold, fever, infections).     Do not wear jewelry, make-up, hairpins, clips or nail polish.  Do not wear lotions, powders, or perfumes. You may wear deodorant.  Do not shave 48 hours prior to surgery. Men may shave face and neck.  Do not bring valuables to the hospital.    [facility] is not responsible for any belongings or valuables.               Contacts, dentures or bridgework may not be worn into surgery.  Leave your suitcase in the car. After surgery it may be brought to your room.  For patients admitted to the hospital, discharge time is determined by your treatment team.   Patients discharged the day of surgery will not be allowed to drive home.   Please read over the following fact sheets that you were given:      _X___ Take these medicines the morning of surgery with A SIP OF WATER:    1. GABAPENTIN  2. AMLODIPINE  3. PRILOSEC  4.  5.  6.  ____ Fleet Enema (as directed)   ____ Use CHG Soap as directed  ____ Use inhalers on the day of surgery  ____ Stop metformin 2 days prior to surgery    ____ Take 1/2 of usual insulin dose the night before surgery and none on the morning of surgery.   ____ Stop Coumadin/Plavix/aspirin-N/A  __X__ Stop Anti-inflammatories-STOP MELOXICAM  NOW   ____ Stop supplements until after surgery.    ____ Bring C-Pap to the hospital.

## 2014-12-18 ENCOUNTER — Ambulatory Visit: Payer: BLUE CROSS/BLUE SHIELD | Admitting: Anesthesiology

## 2014-12-18 ENCOUNTER — Encounter
Admission: RE | Disposition: A | Discharge status: Home / Self Care | Payer: Self-pay | Source: Ambulatory Visit | Attending: General Surgery

## 2014-12-18 ENCOUNTER — Ambulatory Visit
Admission: RE | Admit: 2014-12-18 | Discharge: 2014-12-18 | Disposition: A | Discharge status: Home / Self Care | Payer: BLUE CROSS/BLUE SHIELD | Source: Ambulatory Visit | Attending: General Surgery | Admitting: General Surgery

## 2014-12-18 DIAGNOSIS — C50912 Malignant neoplasm of unspecified site of left female breast: Secondary | ICD-10-CM | POA: Insufficient documentation

## 2014-12-18 DIAGNOSIS — I10 Essential (primary) hypertension: Secondary | ICD-10-CM | POA: Insufficient documentation

## 2014-12-18 DIAGNOSIS — E669 Obesity, unspecified: Secondary | ICD-10-CM | POA: Insufficient documentation

## 2014-12-18 DIAGNOSIS — Z803 Family history of malignant neoplasm of breast: Secondary | ICD-10-CM | POA: Insufficient documentation

## 2014-12-18 DIAGNOSIS — Z800 Family history of malignant neoplasm of digestive organs: Secondary | ICD-10-CM | POA: Insufficient documentation

## 2014-12-18 DIAGNOSIS — C773 Secondary and unspecified malignant neoplasm of axilla and upper limb lymph nodes: Secondary | ICD-10-CM | POA: Diagnosis not present

## 2014-12-18 DIAGNOSIS — R591 Generalized enlarged lymph nodes: Secondary | ICD-10-CM | POA: Diagnosis not present

## 2014-12-18 DIAGNOSIS — Z87891 Personal history of nicotine dependence: Secondary | ICD-10-CM | POA: Insufficient documentation

## 2014-12-18 DIAGNOSIS — Z8542 Personal history of malignant neoplasm of other parts of uterus: Secondary | ICD-10-CM | POA: Insufficient documentation

## 2014-12-18 DIAGNOSIS — Z7951 Long term (current) use of inhaled steroids: Secondary | ICD-10-CM | POA: Insufficient documentation

## 2014-12-18 DIAGNOSIS — Z791 Long term (current) use of non-steroidal anti-inflammatories (NSAID): Secondary | ICD-10-CM | POA: Insufficient documentation

## 2014-12-18 DIAGNOSIS — Z79899 Other long term (current) drug therapy: Secondary | ICD-10-CM | POA: Insufficient documentation

## 2014-12-18 HISTORY — DX: Anemia, unspecified: D64.9

## 2014-12-18 HISTORY — DX: Major depressive disorder, single episode, unspecified: F32.9

## 2014-12-18 HISTORY — PX: AXILLARY LYMPH NODE BIOPSY: SHX5737

## 2014-12-18 HISTORY — PX: SENTINEL NODE BIOPSY: SHX6608

## 2014-12-18 HISTORY — PX: AXILLARY LYMPH NODE DISSECTION: SHX5229

## 2014-12-18 HISTORY — DX: Depression, unspecified: F32.A

## 2014-12-18 HISTORY — DX: Gastro-esophageal reflux disease without esophagitis: K21.9

## 2014-12-18 SURGERY — AXILLARY LYMPH NODE BIOPSY
Anesthesia: General | Laterality: Left | Wound class: Clean

## 2014-12-18 MED ORDER — METHYLENE BLUE 1 % INJ SOLN
INTRAMUSCULAR | Status: DC | PRN
  Administered 2014-12-18: 1.5 mL via INTRADERMAL

## 2014-12-18 MED ORDER — FENTANYL CITRATE (PF) 100 MCG/2ML IJ SOLN
25.0000 ug | INTRAMUSCULAR | Status: AC | PRN
  Administered 2014-12-18 (×6): 25 ug via INTRAVENOUS

## 2014-12-18 MED ORDER — LACTATED RINGERS IV SOLN
Freq: Once | INTRAVENOUS | Status: AC
  Administered 2014-12-18: 09:00:00 via INTRAVENOUS

## 2014-12-18 MED ORDER — LABETALOL HCL 5 MG/ML IV SOLN
10.0000 mg | Freq: Once | INTRAVENOUS | Status: AC
  Administered 2014-12-18: 10 mg via INTRAVENOUS

## 2014-12-18 MED ORDER — TRAMADOL HCL 50 MG PO TABS
50.0000 mg | ORAL_TABLET | Freq: Four times a day (QID) | ORAL | Status: DC | PRN
  Administered 2014-12-18: 50 mg via ORAL

## 2014-12-18 MED ORDER — DEXAMETHASONE SODIUM PHOSPHATE 4 MG/ML IJ SOLN
INTRAMUSCULAR | Status: DC | PRN
  Administered 2014-12-18: 5 mg via INTRAVENOUS

## 2014-12-18 MED ORDER — TECHNETIUM TC 99M SULFUR COLLOID
1.0460 | Freq: Once | INTRAVENOUS | Status: AC | PRN
  Administered 2014-12-18: 1.046 via INTRAVENOUS

## 2014-12-18 MED ORDER — PROPOFOL 10 MG/ML IV BOLUS
INTRAVENOUS | Status: DC | PRN
  Administered 2014-12-18: 150 mg via INTRAVENOUS

## 2014-12-18 MED ORDER — BUPIVACAINE-EPINEPHRINE (PF) 0.5% -1:200000 IJ SOLN
INTRAMUSCULAR | Status: AC
  Filled 2014-12-18: qty 30

## 2014-12-18 MED ORDER — LIDOCAINE HCL (CARDIAC) 20 MG/ML IV SOLN
INTRAVENOUS | Status: DC | PRN
  Administered 2014-12-18: 60 mg via INTRAVENOUS

## 2014-12-18 MED ORDER — FENTANYL CITRATE (PF) 100 MCG/2ML IJ SOLN
INTRAMUSCULAR | Status: AC
  Filled 2014-12-18: qty 2

## 2014-12-18 MED ORDER — ONDANSETRON HCL 4 MG/2ML IJ SOLN
INTRAMUSCULAR | Status: DC | PRN
  Administered 2014-12-18: 4 mg via INTRAVENOUS

## 2014-12-18 MED ORDER — PHENYLEPHRINE HCL 10 MG/ML IJ SOLN
INTRAMUSCULAR | Status: DC | PRN
  Administered 2014-12-18: 50 ug via INTRAVENOUS
  Administered 2014-12-18: 100 ug via INTRAVENOUS
  Administered 2014-12-18: 50 ug via INTRAVENOUS

## 2014-12-18 MED ORDER — ONDANSETRON HCL 4 MG/2ML IJ SOLN: 4.0000 mg | Freq: Once | INTRAMUSCULAR | Status: DC | PRN

## 2014-12-18 MED ORDER — LABETALOL HCL 5 MG/ML IV SOLN
INTRAVENOUS | Status: AC
  Filled 2014-12-18: qty 4

## 2014-12-18 MED ORDER — HYDRALAZINE HCL 20 MG/ML IJ SOLN
10.0000 mg | Freq: Once | INTRAMUSCULAR | Status: AC
  Administered 2014-12-18: 10 mg via INTRAVENOUS

## 2014-12-18 MED ORDER — TRAMADOL HCL 50 MG PO TABS
ORAL_TABLET | ORAL | Status: AC
  Filled 2014-12-18: qty 1

## 2014-12-18 MED ORDER — LACTATED RINGERS IV SOLN
INTRAVENOUS | Status: DC | PRN
  Administered 2014-12-18: 10:00:00 via INTRAVENOUS

## 2014-12-18 MED ORDER — HYDRALAZINE HCL 20 MG/ML IJ SOLN
INTRAMUSCULAR | Status: AC
  Filled 2014-12-18: qty 1

## 2014-12-18 MED ORDER — BUPIVACAINE HCL (PF) 0.5 % IJ SOLN
INTRAMUSCULAR | Status: AC
  Filled 2014-12-18: qty 30

## 2014-12-18 MED ORDER — MIDAZOLAM HCL 2 MG/2ML IJ SOLN
INTRAMUSCULAR | Status: DC | PRN
  Administered 2014-12-18: 2 mg via INTRAVENOUS

## 2014-12-18 MED ORDER — FENTANYL CITRATE (PF) 100 MCG/2ML IJ SOLN
INTRAMUSCULAR | Status: DC | PRN
  Administered 2014-12-18 (×2): 25 ug via INTRAVENOUS
  Administered 2014-12-18: 50 ug via INTRAVENOUS

## 2014-12-18 MED ORDER — METHYLENE BLUE 1 % INJ SOLN
INTRAMUSCULAR | Status: AC
  Filled 2014-12-18: qty 10

## 2014-12-18 MED ORDER — SODIUM CHLORIDE 0.9 % IJ SOLN
INTRAMUSCULAR | Status: AC
  Filled 2014-12-18: qty 10

## 2014-12-18 SURGICAL SUPPLY — 44 items
APPLIER CLIP 11 MED OPEN (CLIP) ×2
BANDAGE ELASTIC 6 CLIP NS LF (GAUZE/BANDAGES/DRESSINGS) IMPLANT
BENZOIN TINCTURE PRP APPL 2/3 (GAUZE/BANDAGES/DRESSINGS) ×2 IMPLANT
BLADE SURG 15 STRL SS SAFETY (BLADE) ×2 IMPLANT
BNDG GAUZE 4.5X4.1 6PLY STRL (MISCELLANEOUS) IMPLANT
CANISTER SUCT 1200ML W/VALVE (MISCELLANEOUS) ×2 IMPLANT
CHLORAPREP W/TINT 26ML (MISCELLANEOUS) ×2 IMPLANT
CLIP APPLIE 11 MED OPEN (CLIP) ×1 IMPLANT
CNTNR SPEC 2.5X3XGRAD LEK (MISCELLANEOUS) ×1
CONT SPEC 4OZ STER OR WHT (MISCELLANEOUS) ×1
CONTAINER SPEC 2.5X3XGRAD LEK (MISCELLANEOUS) ×1 IMPLANT
COVER PROBE FLX POLY STRL (MISCELLANEOUS) ×2 IMPLANT
DRAPE LAPAROTOMY TRNSV 106X77 (MISCELLANEOUS) ×2 IMPLANT
DRESSING TELFA 4X3 1S ST N-ADH (GAUZE/BANDAGES/DRESSINGS) ×2 IMPLANT
DRSG TEGADERM 2X2.25 PEDS (GAUZE/BANDAGES/DRESSINGS) ×2 IMPLANT
DRSG TEGADERM 4X4.75 (GAUZE/BANDAGES/DRESSINGS) ×2 IMPLANT
GAUZE FLUFF 18X24 1PLY STRL (GAUZE/BANDAGES/DRESSINGS) IMPLANT
GLOVE BIO SURGEON STRL SZ7.5 (GLOVE) ×4 IMPLANT
GLOVE INDICATOR 8.0 STRL GRN (GLOVE) ×4 IMPLANT
GOWN STRL REUS W/ TWL LRG LVL3 (GOWN DISPOSABLE) ×2 IMPLANT
GOWN STRL REUS W/TWL LRG LVL3 (GOWN DISPOSABLE) ×2
KIT RM TURNOVER STRD PROC AR (KITS) ×2 IMPLANT
LABEL OR SOLS (LABEL) IMPLANT
MARGIN MAP 10MM (MISCELLANEOUS) IMPLANT
NDL SAFETY 18GX1.5 (NEEDLE) ×2 IMPLANT
NDL SAFETY 22GX1.5 (NEEDLE) ×2 IMPLANT
NDL SAFETY 25GX1.5 (NEEDLE) ×2 IMPLANT
NS IRRIG 500ML POUR BTL (IV SOLUTION) ×2 IMPLANT
PACK BASIN MINOR ARMC (MISCELLANEOUS) ×2 IMPLANT
PAD GROUND ADULT SPLIT (MISCELLANEOUS) ×2 IMPLANT
SHEARS FOC LG CVD HARMONIC 17C (MISCELLANEOUS) IMPLANT
SLEVE PROBE SENORX GAMMA FIND (MISCELLANEOUS) ×2 IMPLANT
STRIP CLOSURE SKIN 1/2X4 (GAUZE/BANDAGES/DRESSINGS) ×2 IMPLANT
SUT SILK 2 0 (SUTURE) ×1
SUT SILK 2-0 30XBRD TIE 12 (SUTURE) ×1 IMPLANT
SUT VIC AB 0 CT1 36 (SUTURE) ×2 IMPLANT
SUT VIC AB 2-0 CT1 27 (SUTURE) ×2
SUT VIC AB 2-0 CT1 TAPERPNT 27 (SUTURE) ×2 IMPLANT
SUT VIC AB 3-0 54X BRD REEL (SUTURE) ×1 IMPLANT
SUT VIC AB 3-0 BRD 54 (SUTURE) ×1
SUT VIC AB 4-0 PS2 18 (SUTURE) ×2 IMPLANT
SWABSTK COMLB BENZOIN TINCTURE (MISCELLANEOUS) IMPLANT
SYR CONTROL 10ML (SYRINGE) ×2 IMPLANT
SYRINGE 10CC LL (SYRINGE) ×2 IMPLANT

## 2014-12-18 NOTE — Anesthesia Preprocedure Evaluation (Signed)
Anesthesia Evaluation  Patient identified by MRN, date of birth, ID band Patient awake    Reviewed: Allergy & Precautions, NPO status , Patient's Chart, lab work & pertinent test results  History of Anesthesia Complications Negative for: history of anesthetic complications  Airway Mallampati: III  TM Distance: >3 FB Neck ROM: Full    Dental   Pulmonary former smoker (quit x 6 months),          Cardiovascular hypertension, Pt. on medications     Neuro/Psych    GI/Hepatic GERD-  Medicated and Controlled,  Endo/Other    Renal/GU      Musculoskeletal   Abdominal   Peds  Hematology   Anesthesia Other Findings   Reproductive/Obstetrics                             Anesthesia Physical Anesthesia Plan  ASA: II  Anesthesia Plan: General   Post-op Pain Management:    Induction: Intravenous  Airway Management Planned: LMA  Additional Equipment:   Intra-op Plan:   Post-operative Plan:   Informed Consent: I have reviewed the patients History and Physical, chart, labs and discussed the procedure including the risks, benefits and alternatives for the proposed anesthesia with the patient or authorized representative who has indicated his/her understanding and acceptance.     Plan Discussed with:   Anesthesia Plan Comments:         Anesthesia Quick Evaluation

## 2014-12-18 NOTE — H&P (Signed)
Patient for planned sentinel node biopsy. She has undergone neo- adjuvant chemotherapy for a breast primary of unknown source.  No change in clinical symptoms.  Cardiopulmonary exam is unremarkable.  Plan: Left axillary sentinel node biopsy, axillary dissection if positive.

## 2014-12-18 NOTE — Transfer of Care (Signed)
Immediate Anesthesia Transfer of Care Note  Patient: Norm Salt  Procedure(s) Performed: Procedure(s): AXILLARY LYMPH NODE BIOPSY/ (Left) SENTINEL NODE BIOPSY (Left) AXILLARY LYMPH NODE DISSECTION (Left)  Patient Location: PACU  Anesthesia Type:General  Level of Consciousness: awake and patient cooperative  Airway & Oxygen Therapy: Patient Spontanous Breathing and Patient connected to face mask oxygen  Post-op Assessment: Report given to RN  Post vital signs: Reviewed and stable  Last Vitals:  Filed Vitals:   12/18/14 1119  BP: 147/104  Pulse: 82  Temp: 97.3  Resp: 16    Complications: No apparent anesthesia complications

## 2014-12-18 NOTE — Discharge Instructions (Signed)
Apply ice to area intermittently for the next 48 hours, then as needed for comfort.   Tylenol if needed for soreness.  Okay to shower.  Outer plastic dressing can be removed in 3 days if desired.AMBULATORY SURGERY  DISCHARGE INSTRUCTIONS   1) The drugs that you were given will stay in your system until tomorrow so for the next 24 hours you should not:  A) Drive an automobile B) Make any legal decisions C) Drink any alcoholic beverage   2) You may resume regular meals tomorrow.  Today it is better to start with liquids and gradually work up to solid foods.  You may eat anything you prefer, but it is better to start with liquids, then soup and crackers, and gradually work up to solid foods.   3) Please notify your doctor immediately if you have any unusual bleeding, trouble breathing, redness and pain at the surgery site, drainage, fever, or pain not relieved by medication. 4)   5) Your post-operative visit with Dr.    George Ina                                 is: Date:                        Time:    Please call to schedule your post-operative visit.  6) Additional Instructions:

## 2014-12-18 NOTE — OR Nursing (Signed)
Dr. Bary Castilla visited. [place]

## 2014-12-18 NOTE — OR Nursing (Signed)
Patient took one pain pill (toradol) before leaving the hospital.  Instructed to repeat dose if necessary when home if no improvement.  May also add tylenol if necessary.

## 2014-12-18 NOTE — Op Note (Signed)
Preoperative diagnosis: Metastatic breast cancer.  Postoperative diagnosis: Same.  Operative procedure: Left axillary node biopsy.  Operative surgeon: Ollen Bowl, M.D.  Anesthesia: Gen. by LMA. , Marcaine 0.5% with 1-200,000 epinephrine, 10 mL.  Estimated blood loss: 5 mL.  This 64 year old woman presented with axillary adenopathy and biopsy showed evidence of metastatic cancer. Preoperative imaging showed no evidence of a breast primary. She is undergone U adjuvant chemotherapy and sentinel node biopsy is planned to determine if actually dissection is required.  Operative note with the patient under adequate general anesthesia the area around the areola was prepped with alcohol and 4 mL of normal saline/methylene blue diluted to 1 injected the subareolar plexus. She had previously been injected with technetium sulfa colloid.  The breast was taped to the right and a roll placed behind the left shoulder. The axilla was prepped with chlor prep and drape. Marcaine was infiltrated for postoperative analgesia over the site of increased uptake on Gamma finder examination.  A transverse skin line incision was made and carried through skin and subcutaneous tissue with hemostasis achieved by electrocautery. A single normal size non-blue hot lymph node was examined and reported as negative for micrometastatic disease by Quay Burow, M.D. Palpation and scanning through the rest of the axilla showed no other palpable adenopathy. The axillary embolus was closed with 20 Vicryls figure-of-eight suture. The adipose layer was closed with a 2-0 Vicryls interrupted sutures. The skin was closed with a running 4-0 Vicryl suture. Benzoin, Steri-Strips, Telfa and Tegaderm dressing applied.  Operative procedure was well tolerated.

## 2014-12-18 NOTE — Anesthesia Postprocedure Evaluation (Signed)
  Anesthesia Post-op Note  Patient: Carrie Alvarez  Procedure(s) Performed: Procedure(s): AXILLARY LYMPH NODE BIOPSY/ (Left) SENTINEL NODE BIOPSY (Left) AXILLARY LYMPH NODE DISSECTION (Left)  Anesthesia type:General  Patient location: PACU  Post pain: Pain level controlled  Post assessment: Post-op Vital signs reviewed, Patient's Cardiovascular Status Stable, Respiratory Function Stable, Patent Airway and No signs of Nausea or vomiting  Post vital signs: Reviewed and stable  Last Vitals:  Filed Vitals:   12/18/14 1311  BP:   Pulse:   Temp: 36.6 C  Resp:     Level of consciousness: awake, alert  and patient cooperative  Complications: No apparent anesthesia complications

## 2014-12-18 NOTE — Anesthesia Procedure Notes (Signed)
Procedure Name: LMA Insertion Date/Time: 12/18/2014 10:29 AM Performed by: Dionne Bucy Pre-anesthesia Checklist: Patient identified Patient Re-evaluated:Patient Re-evaluated prior to inductionOxygen Delivery Method: Circle system utilized Preoxygenation: Pre-oxygenation with 100% oxygen Intubation Type: IV induction Ventilation: Mask ventilation without difficulty LMA Size: 4.0 Number of attempts: 1 Placement Confirmation: positive ETCO2 Tube secured with: Tape Dental Injury: Teeth and Oropharynx as per pre-operative assessment

## 2014-12-19 LAB — SURGICAL PATHOLOGY

## 2014-12-20 ENCOUNTER — Telehealth: Payer: Self-pay | Admitting: *Deleted

## 2014-12-20 NOTE — Telephone Encounter (Signed)
Notified patient as instructed, patient pleased. Discussed follow-up appointments, patient agrees  

## 2014-12-20 NOTE — Telephone Encounter (Signed)
-----   Message from Robert Bellow, MD sent at 12/20/2014  8:14 AM EDT ----- Please notify the patient the final report did not show any residual cancer. Follow-up as scheduled. ----- Message -----    From: [facility] in [facility] Interface    Sent: 12/19/2014   5:39 PM      To: Robert Bellow, MD

## 2014-12-25 ENCOUNTER — Encounter: Payer: Self-pay | Admitting: General Surgery

## 2014-12-25 ENCOUNTER — Ambulatory Visit (INDEPENDENT_AMBULATORY_CARE_PROVIDER_SITE_OTHER): Payer: BLUE CROSS/BLUE SHIELD | Admitting: General Surgery

## 2014-12-25 VITALS — BP 108/62 | HR 80 | Resp 16 | Ht 63.0 in | Wt 192.0 lb

## 2014-12-25 DIAGNOSIS — C773 Secondary and unspecified malignant neoplasm of axilla and upper limb lymph nodes: Secondary | ICD-10-CM

## 2014-12-25 DIAGNOSIS — C50912 Malignant neoplasm of unspecified site of left female breast: Secondary | ICD-10-CM

## 2014-12-25 DIAGNOSIS — C50911 Malignant neoplasm of unspecified site of right female breast: Secondary | ICD-10-CM | POA: Diagnosis not present

## 2014-12-25 DIAGNOSIS — R42 Dizziness and giddiness: Secondary | ICD-10-CM | POA: Insufficient documentation

## 2014-12-25 NOTE — Progress Notes (Signed)
Patient ID: Carrie Alvarez, female   DOB: 07-24-1950, 64 y.o.   MRN: [id number]  Chief Complaint  Patient presents with  . Routine Post Op    HPI Carrie Alvarez is a 64 y.o. female.  Here today for postoperative visit, left axillary node biopsy. She states the incision is fine. Her last chemotherapy treatment was in May 2016. She states she has fallen on Saturday(missed a step) and Sunday(no reason). Does not recall being lightheaded or dizzy. She does notice that since the chemotherapy she has the tremors/shakes all over.  HPI  Past Medical History  Diagnosis Date  . Hypertension   . Back pain   . Obesity   . Breast cancer   . Uterine cancer   . Allergy   . Carpal tunnel syndrome   . Depression   . GERD (gastroesophageal reflux disease)   . Anemia     Past Surgical History  Procedure Laterality Date  . Abdominal hysterectomy    . Breast surgery    . Breast biopsy Left 05/2014  . Axillary lymph node biopsy Left 12/18/2014    Procedure: AXILLARY LYMPH NODE BIOPSY/;  Surgeon: Robert Bellow, MD;  Location: [facility] ORS;  Service: General;  Laterality: Left;  . Sentinel node biopsy Left 12/18/2014    Procedure: SENTINEL NODE BIOPSY;  Surgeon: Robert Bellow, MD;  Location: [facility] ORS;  Service: General;  Laterality: Left;  . Axillary lymph node dissection Left 12/18/2014    Procedure: AXILLARY LYMPH NODE DISSECTION;  Surgeon: Robert Bellow, MD;  Location: [facility] ORS;  Service: General;  Laterality: Left;    Family History  Problem Relation Age of Onset  . Breast cancer Sister   . Colon cancer Mother   . Hypertension Mother   . Asthma Mother   . Cancer Father     Social History History  Substance Use Topics  . Smoking status: Former Smoker -- 0.50 packs/day    Types: Cigarettes    Quit date: 07/18/2014  . Smokeless tobacco: Never Used     Comment: given smoking and pain information   . Alcohol Use: No    No Known Allergies  Current Outpatient  Prescriptions  Medication Sig Dispense Refill  . amLODipine (NORVASC) 2.5 MG tablet Take 2.5 mg by mouth every morning.   0  . fluticasone (FLONASE) 50 MCG/ACT nasal spray Place 1 spray into both nostrils as needed for allergies or rhinitis.    Marland Kitchen gabapentin (NEURONTIN) 300 MG capsule Limit 1 - 2 tabs po / day  or bid if tolerated (Patient taking differently: Take 600 mg by mouth 2 (two) times daily. ) 120 capsule 2  . lidocaine-prilocaine (EMLA) cream Apply 1 application topically as needed. Apply to port then cover with saran wrap 1-2 hours prior to chemotherapy appointment 30 g 1  . loratadine (CLARITIN) 10 MG tablet Take 10 mg by mouth daily.    . meloxicam (MOBIC) 15 MG tablet Take 15 mg by mouth daily.   0  . omeprazole (PRILOSEC) 20 MG capsule Take 40 mg by mouth 2 (two) times daily.    . sertraline (ZOLOFT) 50 MG tablet Take 200 mg by mouth at bedtime.   1  . traMADol (ULTRAM) 50 MG tablet Limit 1 tab po 3-6 times daily if tolerated (Patient taking differently: Take 50 mg by mouth every 6 (six) hours as needed for severe pain. Limit 1 tab po 3-6 times daily if tolerated) 180 tablet 0  . vitamin B-12 (CYANOCOBALAMIN)  500 MCG tablet Take 500 mcg by mouth daily.     No current facility-administered medications for this visit.   Facility-Administered Medications Ordered in Other Visits  Medication Dose Route Frequency Provider Last Rate Last Dose  . sodium chloride 0.9 % injection 10 mL  10 mL Intracatheter PRN [name], MD   10 mL at 11/12/14 1616  . sodium chloride 0.9 % injection 3 mL  3 mL Intravenous PRN [name], MD   3 mL at 11/12/14 1828    Review of Systems Review of Systems  Blood pressure 108/62, pulse 80, resp. rate 16, height 5\' 3"  (1.6 m), weight 192 lb (87.091 kg), SpO2 98 %.  Physical Exam Physical Exam  Constitutional: She is oriented to person, place, and time. She appears well-developed and well-nourished.  HENT:  Mouth/Throat: Oropharynx is  clear and moist and mucous membranes are normal.  Neck: Normal range of motion. Neck supple.  Pulmonary/Chest:    Lymphadenopathy:  Mild swelling at biopsy site left axilla.  Neurological: She is alert and oriented to person, place, and time. She has normal strength and normal reflexes. She displays tremor. No cranial nerve deficit or sensory deficit.  No abnormalities noted. Normal motor strength testing in upper and lower extremities.  Skin: Skin is warm and dry.    Data Reviewed Pathology showed no evidence of residual metastatic mammary carcinoma. Treatment effect appreciated.  Assessment    Small seroma status post axillary node excision. I anticipate this will resolve spontaneously with local heat.    Plan    2 falls and reports of dizziness new over the last several weeks. The case was reviewed with [name], M.D. from medical oncology. The patient has not had a brain MRI post diagnosis and plans are to arrange for this in the near future.    Patient is scheduled for a brain MRI w/wo contrast at [facility] on 12/31/14 at 12:30 pm. She is to arrive there by 12:00 pm that day. Their is no other preparation. Patient is aware of date, time, and instructions.    PCP:  Etheleen Mayhew 12/25/2014, 1:52 PM

## 2014-12-25 NOTE — Patient Instructions (Addendum)
The patient is aware to call back for any questions or concerns.  Patient is scheduled for a brain MRI w/wo contrast at [facility] on 12/31/14 at 12:30 pm. She is to arrive there by 12:00 pm that day. Their is no other preparation. Patient is aware of date, time, and instructions.

## 2014-12-28 NOTE — Progress Notes (Unsigned)
This encounter was created in error - please disregard.

## 2014-12-31 ENCOUNTER — Telehealth: Payer: Self-pay

## 2014-12-31 ENCOUNTER — Ambulatory Visit
Admission: RE | Admit: 2014-12-31 | Discharge: 2014-12-31 | Disposition: A | Discharge status: Home / Self Care | Payer: BLUE CROSS/BLUE SHIELD | Source: Ambulatory Visit | Attending: General Surgery | Admitting: General Surgery

## 2014-12-31 DIAGNOSIS — C50912 Malignant neoplasm of unspecified site of left female breast: Secondary | ICD-10-CM

## 2014-12-31 DIAGNOSIS — G319 Degenerative disease of nervous system, unspecified: Secondary | ICD-10-CM | POA: Insufficient documentation

## 2014-12-31 DIAGNOSIS — I739 Peripheral vascular disease, unspecified: Secondary | ICD-10-CM | POA: Insufficient documentation

## 2014-12-31 DIAGNOSIS — R42 Dizziness and giddiness: Secondary | ICD-10-CM

## 2014-12-31 DIAGNOSIS — W19XXXA Unspecified fall, initial encounter: Secondary | ICD-10-CM | POA: Insufficient documentation

## 2014-12-31 DIAGNOSIS — C773 Secondary and unspecified malignant neoplasm of axilla and upper limb lymph nodes: Secondary | ICD-10-CM | POA: Diagnosis present

## 2014-12-31 MED ORDER — GADOBENATE DIMEGLUMINE 529 MG/ML IV SOLN
20.0000 mL | Freq: Once | INTRAVENOUS | Status: AC | PRN
  Administered 2014-12-31: 20 mL via INTRAVENOUS

## 2014-12-31 NOTE — Telephone Encounter (Signed)
Notified patient as instructed, patient pleased. Discussed follow-up appointments, patient agrees. Results forwarded to patient's PCP Margarita Rana and Dr Grayland Ormond.

## 2014-12-31 NOTE — Telephone Encounter (Signed)
-----   Message from Robert Bellow, MD sent at 12/31/2014  3:15 PM EDT ----- Please notify the patient the MRI showed no change from 2014.  She should plan on a f/u with her primary care MD.  Send copy to PCP and [facility]. Thanks.  ----- Message -----    From: Rad Results In Interface    Sent: 12/31/2014   2:40 PM      To: Robert Bellow, MD

## 2015-01-03 ENCOUNTER — Telehealth: Payer: Self-pay | Admitting: *Deleted

## 2015-01-03 MED ORDER — OMEPRAZOLE 20 MG PO CPDR: 40.0000 mg | DELAYED_RELEASE_CAPSULE | Freq: Two times a day (BID) | ORAL | Status: DC

## 2015-01-03 NOTE — Telephone Encounter (Signed)
escribed

## 2015-01-14 ENCOUNTER — Other Ambulatory Visit: Payer: Self-pay | Admitting: Family Medicine

## 2015-01-14 DIAGNOSIS — F32A Depression, unspecified: Secondary | ICD-10-CM

## 2015-01-14 DIAGNOSIS — F329 Major depressive disorder, single episode, unspecified: Secondary | ICD-10-CM

## 2015-01-14 DIAGNOSIS — M5137 Other intervertebral disc degeneration, lumbosacral region: Secondary | ICD-10-CM

## 2015-01-15 ENCOUNTER — Ambulatory Visit (INDEPENDENT_AMBULATORY_CARE_PROVIDER_SITE_OTHER): Payer: BLUE CROSS/BLUE SHIELD | Admitting: General Surgery

## 2015-01-15 ENCOUNTER — Encounter: Payer: Self-pay | Admitting: General Surgery

## 2015-01-15 VITALS — BP 124/86 | HR 74 | Resp 12 | Ht 63.0 in | Wt 187.0 lb

## 2015-01-15 DIAGNOSIS — C50912 Malignant neoplasm of unspecified site of left female breast: Secondary | ICD-10-CM

## 2015-01-15 DIAGNOSIS — C773 Secondary and unspecified malignant neoplasm of axilla and upper limb lymph nodes: Secondary | ICD-10-CM

## 2015-01-15 NOTE — Patient Instructions (Signed)
Patient to return in six weeks.  

## 2015-01-15 NOTE — Progress Notes (Signed)
Patient ID: Carrie Alvarez, female   DOB: 02-05-1951, 64 y.o.   MRN: [id number]  Chief Complaint  Patient presents with  . Follow-up    HPI Carrie Alvarez is a 64 y.o. female here today for her three week follow up  for postoperative visit, left axillary node biopsy. She states the incision is fine. Patient starts radiation on 7/716.  HPI  Past Medical History  Diagnosis Date  . Hypertension   . Back pain   . Obesity   . Breast cancer   . Uterine cancer   . Allergy   . Carpal tunnel syndrome   . Depression   . GERD (gastroesophageal reflux disease)   . Anemia     Past Surgical History  Procedure Laterality Date  . Abdominal hysterectomy    . Breast surgery    . Breast biopsy Left 05/2014  . Axillary lymph node biopsy Left 12/18/2014    Procedure: AXILLARY LYMPH NODE BIOPSY/;  Surgeon: Robert Bellow, MD;  Location: [facility];  Service: General;  Laterality: Left;  . Sentinel node biopsy Left 12/18/2014    Procedure: SENTINEL NODE BIOPSY;  Surgeon: Robert Bellow, MD;  Location: [facility];  Service: General;  Laterality: Left;  . Axillary lymph node dissection Left 12/18/2014    Procedure: AXILLARY LYMPH NODE DISSECTION;  Surgeon: Robert Bellow, MD;  Location: [facility];  Service: General;  Laterality: Left;    Family History  Problem Relation Age of Onset  . Breast cancer Sister   . Colon cancer Mother   . Hypertension Mother   . Asthma Mother   . Cancer Father     Social History History  Substance Use Topics  . Smoking status: Former Smoker -- 0.50 packs/day    Types: Cigarettes    Quit date: 07/18/2014  . Smokeless tobacco: Never Used     Comment: given smoking and pain information   . Alcohol Use: No    No Known Allergies  Current Outpatient Prescriptions  Medication Sig Dispense Refill  . amLODipine (NORVASC) 2.5 MG tablet Take 2.5 mg by mouth every morning.   0  . fluticasone (FLONASE) 50 MCG/ACT nasal spray Place 1 spray into both  nostrils as needed for allergies or rhinitis.    Marland Kitchen gabapentin (NEURONTIN) 300 MG capsule Limit 1 - 2 tabs po / day  or bid if tolerated (Patient taking differently: Take 600 mg by mouth 2 (two) times daily. ) 120 capsule 2  . lidocaine-prilocaine (EMLA) cream Apply 1 application topically as needed. Apply to port then cover with saran wrap 1-2 hours prior to chemotherapy appointment 30 g 1  . loratadine (CLARITIN) 10 MG tablet Take 10 mg by mouth daily.    . meloxicam (MOBIC) 15 MG tablet TAKE ONE TABLET BY MOUTH ONCE DAILY 30 tablet 5  . omeprazole (PRILOSEC) 20 MG capsule Take 2 capsules (40 mg total) by mouth 2 (two) times daily. 120 capsule 0  . sertraline (ZOLOFT) 50 MG tablet TAKE FOUR TABLETS BY MOUTH AT BEDTIME 120 tablet 5  . traMADol (ULTRAM) 50 MG tablet Limit 1 tab po 3-6 times daily if tolerated (Patient taking differently: Take 50 mg by mouth every 6 (six) hours as needed for severe pain. Limit 1 tab po 3-6 times daily if tolerated) 180 tablet 0  . vitamin B-12 (CYANOCOBALAMIN) 500 MCG tablet Take 500 mcg by mouth daily.     No current facility-administered medications for this visit.   Facility-Administered Medications Ordered in  Other Visits  Medication Dose Route Frequency Provider Last Rate Last Dose  . sodium chloride 0.9 % injection 10 mL  10 mL Intracatheter PRN [name], MD   10 mL at 11/12/14 1616  . sodium chloride 0.9 % injection 3 mL  3 mL Intravenous PRN [name], MD   3 mL at 11/12/14 1828    Review of Systems Review of Systems  Blood pressure 124/86, pulse 74, resp. rate 12, height 5\' 3"  (1.6 m), weight 187 lb (84.823 kg).  Physical Exam Physical Exam  Constitutional: She is oriented to person, place, and time. She appears well-developed and well-nourished.  Pulmonary/Chest:    Neurological: She is alert and oriented to person, place, and time.  Skin: Skin is warm and dry.    Data Reviewed No nodal disease identified.  Assessment     Doing well status post central node biopsy.    Plan    The patient has been encouraged to apply local heat to the area frequently to accelerate resolution of the seroma.   Patient to return in six weeks.   PCP:  Etheleen Mayhew 01/15/2015, 9:18 PM

## 2015-01-17 ENCOUNTER — Inpatient Hospital Stay (HOSPITAL_BASED_OUTPATIENT_CLINIC_OR_DEPARTMENT_OTHER): Payer: BLUE CROSS/BLUE SHIELD | Admitting: Oncology

## 2015-01-17 ENCOUNTER — Ambulatory Visit: Payer: BLUE CROSS/BLUE SHIELD | Admitting: Oncology

## 2015-01-17 ENCOUNTER — Inpatient Hospital Stay: Payer: BLUE CROSS/BLUE SHIELD | Attending: Oncology

## 2015-01-17 VITALS — BP 148/102 | HR 69 | Temp 97.8°F | Resp 18 | Wt 187.6 lb

## 2015-01-17 DIAGNOSIS — K219 Gastro-esophageal reflux disease without esophagitis: Secondary | ICD-10-CM | POA: Insufficient documentation

## 2015-01-17 DIAGNOSIS — D649 Anemia, unspecified: Secondary | ICD-10-CM | POA: Insufficient documentation

## 2015-01-17 DIAGNOSIS — Z87891 Personal history of nicotine dependence: Secondary | ICD-10-CM | POA: Diagnosis not present

## 2015-01-17 DIAGNOSIS — Z853 Personal history of malignant neoplasm of breast: Secondary | ICD-10-CM | POA: Diagnosis not present

## 2015-01-17 DIAGNOSIS — Z79811 Long term (current) use of aromatase inhibitors: Secondary | ICD-10-CM | POA: Insufficient documentation

## 2015-01-17 DIAGNOSIS — Z79899 Other long term (current) drug therapy: Secondary | ICD-10-CM | POA: Insufficient documentation

## 2015-01-17 DIAGNOSIS — I10 Essential (primary) hypertension: Secondary | ICD-10-CM | POA: Insufficient documentation

## 2015-01-17 DIAGNOSIS — T451X5S Adverse effect of antineoplastic and immunosuppressive drugs, sequela: Secondary | ICD-10-CM

## 2015-01-17 DIAGNOSIS — G620 Drug-induced polyneuropathy: Secondary | ICD-10-CM

## 2015-01-17 DIAGNOSIS — Z800 Family history of malignant neoplasm of digestive organs: Secondary | ICD-10-CM | POA: Diagnosis not present

## 2015-01-17 DIAGNOSIS — F329 Major depressive disorder, single episode, unspecified: Secondary | ICD-10-CM | POA: Insufficient documentation

## 2015-01-17 DIAGNOSIS — Z8542 Personal history of malignant neoplasm of other parts of uterus: Secondary | ICD-10-CM | POA: Insufficient documentation

## 2015-01-17 DIAGNOSIS — C50912 Malignant neoplasm of unspecified site of left female breast: Secondary | ICD-10-CM | POA: Diagnosis present

## 2015-01-17 DIAGNOSIS — K5900 Constipation, unspecified: Secondary | ICD-10-CM | POA: Diagnosis not present

## 2015-01-17 DIAGNOSIS — Z170 Estrogen receptor positive status [ER+]: Secondary | ICD-10-CM | POA: Insufficient documentation

## 2015-01-17 DIAGNOSIS — Z9221 Personal history of antineoplastic chemotherapy: Secondary | ICD-10-CM

## 2015-01-17 DIAGNOSIS — Z803 Family history of malignant neoplasm of breast: Secondary | ICD-10-CM | POA: Diagnosis not present

## 2015-01-17 DIAGNOSIS — C773 Secondary and unspecified malignant neoplasm of axilla and upper limb lymph nodes: Secondary | ICD-10-CM | POA: Insufficient documentation

## 2015-01-17 DIAGNOSIS — Z780 Asymptomatic menopausal state: Secondary | ICD-10-CM

## 2015-01-17 DIAGNOSIS — Z90710 Acquired absence of both cervix and uterus: Secondary | ICD-10-CM | POA: Insufficient documentation

## 2015-01-17 MED ORDER — OMEPRAZOLE 20 MG PO CPDR: 40.0000 mg | DELAYED_RELEASE_CAPSULE | Freq: Two times a day (BID) | ORAL | Status: DC

## 2015-01-17 MED ORDER — LETROZOLE 2.5 MG PO TABS: 2.5000 mg | ORAL_TABLET | Freq: Every day | ORAL | Status: DC

## 2015-01-17 MED ORDER — SODIUM CHLORIDE 0.9 % IJ SOLN
10.0000 mL | Freq: Once | INTRAMUSCULAR | Status: DC
  Filled 2015-01-17: qty 10

## 2015-01-17 MED ORDER — HEPARIN SOD (PORK) LOCK FLUSH 100 UNIT/ML IV SOLN: 500.0000 [IU] | Freq: Once | INTRAVENOUS | Status: AC

## 2015-01-17 NOTE — Progress Notes (Signed)
Blood pressure is elevated today so she will check bp at home and if does not improve or consistently is elevated she will call her PCP.  Still having neuropathy and was not able to tolerate the Gabapentin 2 bid due to causing fatigue so will try taking 1 QAM with 2 QPM.

## 2015-01-24 ENCOUNTER — Ambulatory Visit
Admission: RE | Admit: 2015-01-24 | Discharge: 2015-01-24 | Disposition: A | Discharge status: Home / Self Care | Payer: BLUE CROSS/BLUE SHIELD | Source: Ambulatory Visit | Attending: Oncology | Admitting: Oncology

## 2015-01-24 DIAGNOSIS — Z780 Asymptomatic menopausal state: Secondary | ICD-10-CM

## 2015-01-31 ENCOUNTER — Encounter: Payer: Self-pay | Admitting: Pain Medicine

## 2015-01-31 ENCOUNTER — Ambulatory Visit: Payer: BLUE CROSS/BLUE SHIELD | Attending: Pain Medicine | Admitting: Pain Medicine

## 2015-01-31 VITALS — BP 136/98 | HR 78 | Temp 98.2°F | Resp 16 | Ht 63.0 in | Wt 185.0 lb

## 2015-01-31 DIAGNOSIS — M47816 Spondylosis without myelopathy or radiculopathy, lumbar region: Secondary | ICD-10-CM | POA: Diagnosis not present

## 2015-01-31 DIAGNOSIS — M79604 Pain in right leg: Secondary | ICD-10-CM | POA: Diagnosis present

## 2015-01-31 DIAGNOSIS — M533 Sacrococcygeal disorders, not elsewhere classified: Secondary | ICD-10-CM

## 2015-01-31 DIAGNOSIS — M545 Low back pain: Secondary | ICD-10-CM | POA: Diagnosis present

## 2015-01-31 DIAGNOSIS — M5137 Other intervertebral disc degeneration, lumbosacral region: Secondary | ICD-10-CM

## 2015-01-31 DIAGNOSIS — M4806 Spinal stenosis, lumbar region: Secondary | ICD-10-CM | POA: Insufficient documentation

## 2015-01-31 DIAGNOSIS — M79605 Pain in left leg: Secondary | ICD-10-CM | POA: Diagnosis present

## 2015-01-31 DIAGNOSIS — M47817 Spondylosis without myelopathy or radiculopathy, lumbosacral region: Secondary | ICD-10-CM

## 2015-01-31 MED ORDER — GABAPENTIN 300 MG PO CAPS: ORAL_CAPSULE | ORAL | Status: DC

## 2015-01-31 MED ORDER — TRAMADOL HCL 50 MG PO TABS: ORAL_TABLET | ORAL | Status: DC

## 2015-01-31 NOTE — Progress Notes (Signed)
Subjective:    Patient ID: Carrie Alvarez, female    DOB: 04/10/1951, 64 y.o.   MRN: [id number]  HPI  Patient is 64 year old female returns to [place] for further evaluation and treatment of pain involving the lower back lower extremity region. Patient is with recent diagnosis and treatment of carcinoma of the left breast. Patient states that she has severe pain of the lower back region and was unable to stand and singing in the choir due to the increased lower back lower extremity pain. Patient states that she is desperate to undergo treatment for her severe lower back lower extremity pain. With informed patient that we would be medical clearance from patient's primary care physician and oncologist prior to performing the procedure. The patient was with understanding and wished to scheduled lumbar facet, medial branch nerve, blocks. We will await medical clearance and we'll proceed with interventional treatment as planned provide obtain medical clearance. We will continue tramadol and Neurontin as presently prescribed. The patient was understanding and in agreement status treatment plan.     Review of Systems     Objective:   Physical Exam  There was tends to palpation of the splenius capitis and occipitalis musculature region. Palpation of these regions reproduced mild discomfort. There was tenderness over the region of the cervical facet cervical paraspinal muscular region as well as the thoracic facet thoracic paraspinal musculature region there was tenderness of the acromioclavicular glenohumeral joint region. No crepitus of the thoracic region was noted. There appeared to be unremarkable Spurling's maneuver. Patient was with slightly decreased grip strength noted. Tinel and Phalen's maneuver were without increase of pain significant degree. Palpation over the lumbar facet lumbar paraspinal muscles region was a tends to palpation of moderate to moderately severe degree.  Lateral bending and rotation was associated with severe discomfort. Patient was unable to stand erect without experiencing severe lumbar lower extremity pain. There was tenderness of the PSIS and PII S region a moderate to moderately severe degree. Mild tenderness of the greater trochanteric region iliotibial band region. Straight leg raising tolerates approximately 20 without a definite increase of pain with dorsiflexion noted. There was negative clonus negative Homans. Abdomen nontender with no costovertebral maintenance noted.      Assessment & Plan:   Progress Notes   BRIEL GALLICCHIO (MR# [id number])      Progress Notes Info    Author Note Status Last Update User Last Update Date/Time   [name], MD Signed [name], MD 11/13/2014 6:45 PM    Progress Notes    Expand All Collapse All     Subjective:    Patient ID: Carrie Alvarez, female DOB: [birth year], 64 y.o. MRN: [id number]  HPI  Patient is a 64 year old female [facility] for follow-up evaluation of lumbar and lower extremity pain. Patient states that pain is well controlled at this time with the present medication. We will avoid interventional treatment while patient continues treatment for carcinoma of the breast. As explained to patient we will proceed with interventional treatment including lumbar epidural steroid injection should Dr. Grayland Ormond state that patient is able to undergo lumbar epidural steroid injection and consideration of patient's treatment for carcinoma of the breast. We will await decision of Dr. Grayland Ormond and patient regarding patient undergoing lumbar epidural steroid injection and we'll proceed with such if Dr. Grayland Ormond feels that patient can tolerate procedure.  Review of Systems     Objective:   Physical Exam Physical examination revealed  tenderness to palpation of paraspinal musculature in the cervical region cervical facet region of mild degree.  Patient was with bilaterally equal grip strength reevaluation revealed questionably decreased grip strength. There was tenderness of the thoracic facet region of minimal degree. No crepitus of the thoracic region. Rotation and extension with palpation over the lumbar facets reproduced significant pain. There was tenderness to palpation of the PSIS NP IIS regions of moderate degree. There was a limited straight leg raising without sensory deficit of dermatomal distribution detected. Minimal tenderness of the greater trochanteric region noted no abdominal tenderness to palpation no costovertebral angle tenderness noted       Assessment & Plan:  Assessment patient with lumbar and lower extremity pain felt to be due to multilevel degenerative changes of the lumbar spine with some findings suggestive of neurogenic claudication. Patient with MRI evidence of multilevel degenerative changes of the lumbar spine with facet degenerative changes and stenosis may benefit from lumbar facet, medial branch nerve, blocks. We we will proceed with lumbar facet, medial branch nerve, blocks pending medical clearance by Dr. Grayland Ormond and Dr. Venia Minks in lieu of patient's carcinoma of the breast with treatment for carcinoma the breasts and process at this time.  Lumbar facet syndrome  Lumbar stenosis  Plan  Continue present medications Neurontin and Ultram  Lumbar facet, medial branch nerve, blocks to be performed at time of return appointment pending medical clearance by Dr. Grayland Ormond and Dr. Venia Minks  F/U PCP Dr. Venia Minks for evaliation of  BP and general medical  condition and for medical clearance palpation to undergo lumbar facet, medial branch nerve blocks.  F/U Dr. Grayland Ormond for carcinoma of the breast and from medical clearance to undergo lumbar facet, medial branch nerve, blocks  F/U surgical evaluation  F/U neurological evaluation  May consider radiofrequency rhizolysis or intraspinal procedures pending  response to present treatment and F/U evaluation.  Patient to call [facility] should patient have concerns prior to scheduled return appointment.

## 2015-01-31 NOTE — Progress Notes (Signed)
Safety precautions to be maintained throughout the outpatient stay will include: orient to surroundings, keep bed in low position, maintain call bell within reach at all times, provide assistance with transfer out of bed and ambulation.  

## 2015-01-31 NOTE — Patient Instructions (Addendum)
Continue present medication tramadol and Neurontin  Lumbar facet, medial branch nerve, blocks to be performed 02/13/2015 pending medical clearance by your physician Dr. Venia Minks  F/U PCP Dr. Venia Minks for evaliation of  BP and general medical  condition.  F/U surgical evaluation  F/U neurological evaluation  F/U oncological evaluation  May consider radiofrequency rhizolysis or intraspinal procedures pending response to present treatment and F/U evaluation.  Patient to call [facility] should patient have concerns prior to scheduled return appointment. Selective Nerve Root Block Patient Information  Description: Specific nerve roots exit the spinal canal and these nerves can be compressed and inflamed by a bulging disc and bone spurs.  By injecting steroids on the nerve root, we can potentially decrease the inflammation surrounding these nerves, which often leads to decreased pain.  Also, by injecting local anesthesia on the nerve root, this can provide [place] helpful information to give to your referring doctor if it decreases your pain.  Selective nerve root blocks can be done along the spine from the neck to the low back depending on the location of your pain.   After numbing the skin with local anesthesia, a small needle is passed to the nerve root and the position of the needle is verified using x-ray pictures.  After the needle is in correct position, we then deposit the medication.  You may experience a pressure sensation while this is being done.  The entire block usually lasts less than 15 minutes.  Conditions that may be treated with selective nerve root blocks:  Low back and leg pain  Spinal stenosis  Diagnostic block prior to potential surgery  Neck and arm pain  Post laminectomy syndrome  Preparation for the injection:  1. Do not eat any solid food or dairy products within 6 hours of your appointment. 2. You may drink clear liquids up to 2 hours before an  appointment.  Clear liquids include water, black coffee, juice or soda.  No milk or cream please. 3. You may take your regular medications, including pain medications, with a sip of water before your appointment.  Diabetics should hold regular insulin (if taken separately) and take 1/2 normal NPH dose the morning of the procedure.  Carry some sugar containing items with you to your appointment. 4. A driver must accompany you and be prepared to drive you home after your procedure. 5. Bring all your current medications with you. 6. An IV may be inserted and sedation may be given at the discretion of the physician. 7. A blood pressure cuff, EKG, and other monitors will often be applied during the procedure.  Some patients may need to have extra oxygen administered for a short period. 8. You will be asked to provide medical information, including allergies, prior to the procedure.  We must know immediately if you are taking blood  Thinners (like Coumadin) or if you are allergic to IV iodine contrast (dye).  Possible side-effects: All are usually temporary  Bleeding from needle site  Light headedness  Numbness and tingling  Decreased blood pressure  Weakness in arms/legs  Pressure sensation in back/neck  Pain at injection site (several days)  Possible complications: All are extremely rare  Infection  Nerve injury  Spinal headache (a headache wore with upright position)  Call if you experience:  Fever/chills associated with headache or increased back/neck pain  Headache worsened by an upright position  New onset weakness or numbness of an extremity below the injection site  Hives or difficulty breathing (go  to the emergency room)  Inflammation or drainage at the injection site(s)  Severe back/neck pain greater than usual  New symptoms which are concerning to you  Please note:  Although the local anesthetic injected can often make your back or neck feel good for  several hours after the injection the pain will likely return.  It takes 3-5 days for steroids to work on the nerve root. You may not notice any pain relief for at least one week.  If effective, we will often do a series of 3 injections spaced 3-6 weeks apart to maximally decrease your pain.    If you have any questions, please call [phone] [facility] Clinic[facility] The facet joints connect the bones of the spine (vertebrae). They make it possible for you to bend, twist, and make other movements with your spine. They also prevent you from overbending, overtwisting, and making other excessive movements.  A facet joint block is a procedure where a numbing medicine (anesthetic) is injected into a facet joint. Often, a type of anti-inflammatory medicine called a steroid is also injected. A facet joint block may be done for two reasons:  6. Diagnosis. A facet joint block may be done as a test to see whether neck or back pain is caused by a worn-down or infected facet joint. If the pain gets better after a facet joint block, it means the pain is probably coming from the facet joint. If the pain does not get better, it means the pain is probably not coming from the facet joint.  7. Therapy. A facet joint block may be done to relieve neck or back pain caused by a facet joint. A facet joint block is only done as a therapy if the pain does not improve with medicine, exercise programs, physical therapy, and other forms of pain management. LET [facility] CARE PROVIDER KNOW ABOUT:  9. Any allergies you have.  10. All medicines you are taking, including vitamins, herbs, eyedrops, and over-the-counter medicines and creams.  11. Previous problems you or members of your family have had with the use of anesthetics.  12. Any blood disorders you have had.  13. Other health problems you have. RISKS AND COMPLICATIONS Generally, having a facet joint block is safe. However,  as with any procedure, complications can occur. Possible complications associated with having a facet joint block include:  8. Bleeding.  9. Injury to a nerve near the injection site.  10. Pain at the injection site.  11. Weakness or numbness in areas controlled by nerves near the injection site.  12. Infection.  13. Temporary fluid retention.  14. Allergic reaction to anesthetics or medicines used during the procedure. BEFORE THE PROCEDURE   Follow your health care provider's instructions if you are taking dietary supplements or medicines. You may need to stop taking them or reduce your dosage.   Do not take any new dietary supplements or medicines without asking your health care provider first.   Follow your health care provider's instructions about eating and drinking before the procedure. You may need to stop eating and drinking several hours before the procedure.   Arrange to have an adult drive you home after the procedure. PROCEDURE 8. You may need to remove your clothing and dress in an open-back gown so that your health care provider can access your spine.  9. The procedure will be done while you are lying on an X-ray table. Most of the time you will be  asked to lie on your stomach, but you may be asked to lie in a different position if an injection will be made in your neck.  10. Special machines will be used to monitor your oxygen levels, heart rate, and blood pressure.  11. If an injection will be made in your neck, an intravenous (IV) tube will be inserted into one of your veins. Fluids and medicine will flow directly into your body through the IV tube.  12. The area over the facet joint where the injection will be made will be cleaned with an antiseptic soap. The surrounding skin will be covered with sterile drapes.  13. An anesthetic will be applied to your skin to make the injection area numb. You may feel a temporary stinging or burning sensation.  14. A video  X-ray machine will be used to locate the joint. A contrast dye may be injected into the facet joint area to help with locating the joint.  15. When the joint is located, an anesthetic medicine will be injected into the joint through the needle.  50. Your health care provider will ask you whether you feel pain relief. If you do feel relief, a steroid may be injected to provide pain relief for a longer period of time. If you do not feel relief or feel only partial relief, additional injections of an anesthetic may be made in other facet joints.  17. The needle will be removed, the skin will be cleansed, and bandages will be applied.  AFTER THE PROCEDURE  4. You will be observed for 15-30 minutes before being allowed to go home. Do not drive. Have an adult drive you or take a taxi or public transportation instead.  5. If you feel pain relief, the pain will return in several hours or days when the anesthetic wears off.  6. You may feel pain relief 2-14 days after the procedure. The amount of time this relief lasts varies from person to person.  7. It is normal to feel some tenderness over the injected area(s) for 2 days following the procedure.  8. If you have diabetes, you may have a temporary increase in blood sugar. Document Released: 11/18/2006 Document Revised: 11/13/2013 Document Reviewed: 04/18/2012 [facility] Patient Information 2015 [place], [place]. This information is not intended to replace advice given to you by your health care provider. Make sure you discuss any questions you have with your health care provider. GENERAL RISKS AND COMPLICATIONS  What are the risk, side effects and possible complications? Generally speaking, most procedures are safe.  However, with any procedure there are risks, side effects, and the possibility of complications.  The risks and complications are dependent upon the sites that are lesioned, or the type of nerve block to be performed.  The closer the  procedure is to the spine, the more serious the risks are.  Great care is taken when placing the radio frequency needles, block needles or lesioning probes, but sometimes complications can occur. 1. Infection: Any time there is an injection through the skin, there is a risk of infection.  This is why sterile conditions are used for these blocks.  There are four possible types of infection. 1. Localized skin infection. 2. Central Nervous System Infection-This can be in the form of Meningitis, which can be deadly. 3. Epidural Infections-This can be in the form of an epidural abscess, which can cause pressure inside of the spine, causing compression of the spinal cord with subsequent paralysis. This would require an emergency  surgery to decompress, and there are no guarantees that the patient would recover from the paralysis. 4. Discitis-This is an infection of the intervertebral discs.  It occurs in about 1% of discography procedures.  It is difficult to treat and it may lead to surgery.        2. Pain: the needles have to go through skin and soft tissues, will cause soreness.       3. Damage to internal structures:  The nerves to be lesioned may be near blood vessels or    other nerves which can be potentially damaged.       4. Bleeding: Bleeding is more common if the patient is taking blood thinners such as  aspirin, Coumadin, Ticiid, Plavix, etc., or if he/she have some genetic predisposition  such as hemophilia. Bleeding into the spinal canal can cause compression of the spinal  cord with subsequent paralysis.  This would require an emergency surgery to  decompress and there are no guarantees that the patient would recover from the  paralysis.       5. Pneumothorax:  Puncturing of a lung is a possibility, every time a needle is introduced in  the area of the chest or upper back.  Pneumothorax refers to free air around the  collapsed lung(s), inside of the thoracic cavity (chest cavity).  Another two  possible  complications related to a similar event would include: Hemothorax and Chylothorax.   These are variations of the Pneumothorax, where instead of air around the collapsed  lung(s), you may have blood or chyle, respectively.       6. Spinal headaches: They may occur with any procedures in the area of the spine.       7. Persistent CSF (Cerebro-Spinal Fluid) leakage: This is a rare problem, but may occur  with prolonged intrathecal or epidural catheters either due to the formation of a fistulous  track or a dural tear.       8. Nerve damage: By working so close to the spinal cord, there is always a possibility of  nerve damage, which could be as serious as a permanent spinal cord injury with  paralysis.       9. Death:  Although rare, severe deadly allergic reactions known as "Anaphylactic  reaction" can occur to any of the medications used.      10. Worsening of the symptoms:  We can always make thing worse.  What are the chances of something like this happening? Chances of any of this occuring are extremely low.  By statistics, you have more of a chance of getting killed in a motor vehicle accident: while driving to the hospital than any of the above occurring .  Nevertheless, you should be aware that they are possibilities.  In general, it is similar to taking a shower.  Everybody knows that you can slip, hit your head and get killed.  Does that mean that you should not shower again?  Nevertheless always keep in mind that statistics do not mean anything if you happen to be on the wrong side of them.  Even if a procedure has a 1 (one) in a 1,000,000 (million) chance of going wrong, it you happen to be that one..Also, keep in mind that by statistics, you have more of a chance of having something go wrong when taking medications.  Who should not have this procedure? If you are on a blood thinning medication (e.g. Coumadin, Plavix, see list of "Blood Thinners"), or if  you have an active infection  going on, you should not have the procedure.  If you are taking any blood thinners, please inform your physician.  How should I prepare for this procedure?  Do not eat or drink anything at least six hours prior to the procedure.  Bring a driver with you .  It cannot be a taxi.  Come accompanied by an adult that can drive you back, and that is strong enough to help you if your legs get weak or numb from the local anesthetic.  Take all of your medicines the morning of the procedure with just enough water to swallow them.  If you have diabetes, make sure that you are scheduled to have your procedure done first thing in the morning, whenever possible.  If you have diabetes, take only half of your insulin dose and notify our nurse that you have done so as soon as you arrive at the clinic.  If you are diabetic, but only take blood sugar pills (oral hypoglycemic), then do not take them on the morning of your procedure.  You may take them after you have had the procedure.  Do not take aspirin or any aspirin-containing medications, at least eleven (11) days prior to the procedure.  They may prolong bleeding.  Wear loose fitting clothing that may be easy to take off and that you would not mind if it got stained with Betadine or blood.  Do not wear any jewelry or perfume  Remove any nail coloring.  It will interfere with some of our monitoring equipment.  NOTE: Remember that this is not meant to be interpreted as a complete list of all possible complications.  Unforeseen problems may occur.  BLOOD THINNERS The following drugs contain aspirin or other products, which can cause increased bleeding during surgery and should not be taken for 2 weeks prior to and 1 week after surgery.  If you should need take something for relief of minor pain, you may take acetaminophen which is found in Tylenol,m Datril, Anacin-3 and Panadol. It is not blood thinner. The products listed below are.  Do not take any of  the products listed below in addition to any listed on your instruction sheet.  A.P.C or A.P.C with Codeine Codeine Phosphate Capsules #3 Ibuprofen Ridaura  ABC compound Congesprin Imuran rimadil  Advil Cope Indocin Robaxisal  Alka-Seltzer Effervescent Pain Reliever and Antacid Coricidin or Coricidin-D  Indomethacin Rufen  Alka-Seltzer plus Cold Medicine Cosprin Ketoprofen S-A-C Tablets  Anacin Analgesic Tablets or Capsules Coumadin Korlgesic Salflex  Anacin Extra Strength Analgesic tablets or capsules CP-2 Tablets Lanoril Salicylate  Anaprox Cuprimine Capsules Levenox Salocol  Anexsia-D Dalteparin Magan Salsalate  Anodynos Darvon compound Magnesium Salicylate Sine-off  Ansaid Dasin Capsules Magsal Sodium Salicylate  Anturane Depen Capsules Marnal Soma  APF Arthritis pain formula Dewitt's Pills Measurin Stanback  Argesic Dia-Gesic Meclofenamic Sulfinpyrazone  Arthritis Bayer Timed Release Aspirin Diclofenac Meclomen Sulindac  Arthritis pain formula Anacin Dicumarol Medipren Supac  Analgesic (Safety coated) Arthralgen Diffunasal Mefanamic Suprofen  Arthritis Strength Bufferin Dihydrocodeine Mepro Compound Suprol  Arthropan liquid Dopirydamole Methcarbomol with Aspirin Synalgos  ASA tablets/Enseals Disalcid Micrainin Tagament  Ascriptin Doan's Midol Talwin  Ascriptin A/D Dolene Mobidin Tanderil  Ascriptin Extra Strength Dolobid Moblgesic Ticlid  Ascriptin with Codeine Doloprin or Doloprin with Codeine Momentum Tolectin  Asperbuf Duoprin Mono-gesic Trendar  Aspergum Duradyne Motrin or Motrin IB Triminicin  Aspirin plain, buffered or enteric coated Durasal Myochrisine Trigesic  Aspirin Suppositories Easprin Nalfon Trillsate  Aspirin with  Codeine Ecotrin Regular or Extra Strength Naprosyn Uracel  Atromid-S Efficin Naproxen Ursinus  Auranofin Capsules Elmiron Neocylate Vanquish  Axotal Emagrin Norgesic Verin  Azathioprine Empirin or Empirin with Codeine Normiflo Vitamin E  Azolid  Emprazil Nuprin Voltaren  Bayer Aspirin plain, buffered or children's or timed BC Tablets or powders Encaprin Orgaran Warfarin Sodium  Buff-a-Comp Enoxaparin Orudis Zorpin  Buff-a-Comp with Codeine Equegesic Os-Cal-Gesic   Buffaprin Excedrin plain, buffered or Extra Strength Oxalid   Bufferin Arthritis Strength Feldene Oxphenbutazone   Bufferin plain or Extra Strength Feldene Capsules Oxycodone with Aspirin   Bufferin with Codeine Fenoprofen Fenoprofen Pabalate or Pabalate-SF   Buffets II Flogesic Panagesic   Buffinol plain or Extra Strength Florinal or Florinal with Codeine Panwarfarin   Buf-Tabs Flurbiprofen Penicillamine   Butalbital Compound Four-way cold tablets Penicillin   Butazolidin Fragmin Pepto-Bismol   Carbenicillin Geminisyn Percodan   Carna Arthritis Reliever Geopen Persantine   Carprofen Gold's salt Persistin   Chloramphenicol Goody's Phenylbutazone   Chloromycetin Haltrain Piroxlcam   Clmetidine heparin Plaquenil   Cllnoril Hyco-pap Ponstel   Clofibrate Hydroxy chloroquine Propoxyphen         Before stopping any of these medications, be sure to consult the physician who ordered them.  Some, such as Coumadin (Warfarin) are ordered to prevent or treat serious conditions such as "deep thrombosis", "pumonary embolisms", and other heart problems.  The amount of time that you may need off of the medication may also vary with the medication and the reason for which you were taking it.  If you are taking any of these medications, please make sure you notify your pain physician before you undergo any procedures.

## 2015-02-04 NOTE — Progress Notes (Signed)
[place]  [phone]) [phone] [phone]) [phone]  ID: Carrie Alvarez OB: September 04, 1950  MR#: [id number]  WUX#:[id number]  Patient Care Team: Margarita Rana, MD as PCP - General (Family Medicine) [name], MD as Consulting Physician (Oncology) Robert Bellow, MD (General Surgery)  CHIEF COMPLAINT:  Chief Complaint  Patient presents with  . Follow-up    breast cancer    INTERVAL HISTORY: Patient returns to clinic today for further evaluation. She continues to have a mild peripheral neuropathy, but otherwise feels well. She denies any further dizziness or falls. She has no other neurologic complaints. She denies any recent fevers. She denies any pain. She has no chest pain or shortness of breath. She denies any nausea, vomiting, constipation, or diarrhea.  She has no urinary complaints. Patient offers no further specific complaints today.    REVIEW OF SYSTEMS:   Review of Systems  Constitutional: Negative.   Respiratory: Negative.   Cardiovascular: Negative.   Gastrointestinal: Negative.   Neurological: Positive for sensory change. Negative for dizziness.    As per HPI. Otherwise, a complete review of systems is negatve.  PAST MEDICAL HISTORY: Past Medical History  Diagnosis Date  . Hypertension   . Back pain   . Obesity   . Breast cancer   . Uterine cancer   . Allergy   . Carpal tunnel syndrome   . Depression   . GERD (gastroesophageal reflux disease)   . Anemia     PAST SURGICAL HISTORY: Past Surgical History  Procedure Laterality Date  . Abdominal hysterectomy    . Breast surgery    . Breast biopsy Left 05/2014  . Axillary lymph node biopsy Left 12/18/2014    Procedure: AXILLARY LYMPH NODE BIOPSY/;  Surgeon: Robert Bellow, MD;  Location: [facility] ORS;  Service: General;  Laterality: Left;  . Sentinel node biopsy Left 12/18/2014    Procedure: SENTINEL NODE BIOPSY;  Surgeon: Robert Bellow, MD;  Location: [facility] ORS;   Service: General;  Laterality: Left;  . Axillary lymph node dissection Left 12/18/2014    Procedure: AXILLARY LYMPH NODE DISSECTION;  Surgeon: Robert Bellow, MD;  Location: [facility] ORS;  Service: General;  Laterality: Left;    FAMILY HISTORY Family History  Problem Relation Age of Onset  . Breast cancer Sister   . Colon cancer Mother   . Hypertension Mother   . Asthma Mother   . Cancer Father        ADVANCED DIRECTIVES:    HEALTH MAINTENANCE: History  Substance Use Topics  . Smoking status: Former Smoker -- 0.50 packs/day    Types: Cigarettes    Quit date: 07/18/2014  . Smokeless tobacco: Never Used     Comment: given smoking and pain information   . Alcohol Use: No     Colonoscopy:  PAP:  Bone density:  Lipid panel:  No Known Allergies  Current Outpatient Prescriptions  Medication Sig Dispense Refill  . amLODipine (NORVASC) 2.5 MG tablet Take 2.5 mg by mouth every morning.   0  . fluticasone (FLONASE) 50 MCG/ACT nasal spray Place 1 spray into both nostrils as needed for allergies or rhinitis.    Marland Kitchen letrozole (FEMARA) 2.5 MG tablet Take 1 tablet (2.5 mg total) by mouth daily. 30 tablet 11  . lidocaine-prilocaine (EMLA) cream Apply 1 application topically as needed. Apply to port then cover with saran wrap 1-2 hours prior to chemotherapy appointment 30 g 1  . loratadine (CLARITIN) 10 MG tablet Take 10 mg by  mouth daily.    . meloxicam (MOBIC) 15 MG tablet TAKE ONE TABLET BY MOUTH ONCE DAILY 30 tablet 5  . omeprazole (PRILOSEC) 20 MG capsule Take 2 capsules (40 mg total) by mouth 2 (two) times daily. (Patient not taking: Reported on 01/31/2015) 120 capsule 2  . sertraline (ZOLOFT) 50 MG tablet TAKE FOUR TABLETS BY MOUTH AT BEDTIME 120 tablet 5  . vitamin B-12 (CYANOCOBALAMIN) 500 MCG tablet Take 500 mcg by mouth daily.    Marland Kitchen gabapentin (NEURONTIN) 300 MG capsule Limit 1 - 2 tabs po / day  or bid if tolerated 120 capsule 2  . pyridOXINE (VITAMIN B-6) 100 MG tablet Take 100  mg by mouth daily.    . traMADol (ULTRAM) 50 MG tablet Limit 1 tab by mouth 3-6 times per day if tolerated 180 tablet 0   No current facility-administered medications for this visit.   Facility-Administered Medications Ordered in Other Visits  Medication Dose Route Frequency Provider Last Rate Last Dose  . heparin lock flush 100 unit/mL  500 Units Intravenous Once [name], MD      . sodium chloride 0.9 % injection 10 mL  10 mL Intracatheter PRN [name], MD   10 mL at 11/12/14 1616  . sodium chloride 0.9 % injection 10 mL  10 mL Intravenous Once [name], MD      . sodium chloride 0.9 % injection 3 mL  3 mL Intravenous PRN [name], MD   3 mL at 11/12/14 1828    OBJECTIVE: Filed Vitals:   01/17/15 1704  BP: 148/102  Pulse: 69  Temp: 97.8 F (36.6 C)  Resp: 18     Body mass index is 33.24 kg/(m^2).    ECOG FS:1 - Symptomatic but completely ambulatory  General: Well-developed, well-nourished, no acute distress. Eyes: anicteric sclera. Breasts: Exam deferred today. Lungs: Clear to auscultation bilaterally. Heart: Regular rate and rhythm. No rubs, murmurs, or gallops. Abdomen: Soft, nontender, nondistended. No organomegaly noted, normoactive bowel sounds. Musculoskeletal: No edema, cyanosis, or clubbing. Neuro: Alert, answering all questions appropriately. Cranial nerves grossly intact. Skin: No rashes or petechiae noted. Psych: Normal affect.    LAB RESULTS:  Lab Results  Component Value Date   NA 138 11/19/2014   K 3.9 11/19/2014   CL 108 11/19/2014   CO2 25 11/19/2014   GLUCOSE 83 11/19/2014   BUN 18 11/19/2014   CREATININE 1.03* 11/19/2014   CALCIUM 9.1 11/19/2014   PROT 7.4 08/20/2014   ALBUMIN 3.9 08/20/2014   AST 33 08/20/2014   ALT 28 08/20/2014   ALKPHOS 103 08/20/2014   BILITOT 2.4* 08/20/2014   GFRNONAA 56* 11/19/2014   GFRAA >60 11/19/2014    Lab Results  Component Value Date   WBC 5.5 11/19/2014   NEUTROABS  3.8 11/19/2014   HGB 10.1* 11/19/2014   HCT 31.0* 11/19/2014   MCV 88.4 11/19/2014   PLT 244 11/19/2014     STUDIES: Dg Bone Density  01/24/2015   EXAM: DUAL X-RAY ABSORPTIOMETRY (DXA) FOR BONE MINERAL DENSITY  IMPRESSION: Your patient Carrie Alvarez completed a BMD test on 01/24/2015 using the [place] (analysis version: 14.10) manufactured by [employer]. The following summarizes the results of our evaluation.  PATIENT BIOGRAPHICAL: Name: Carrie Alvarez, Carrie Alvarez Patient ID: [id number] Birth Date: [birth year] Height: 64.0 in. Gender: Female Exam Date: 01/24/2015 Weight: 185.2 lbs. Indications: Breast CA, High Risk Meds, History of Breast Cancer, History of Fracture (Adult), Hysterectomy, Postmenopausal Fractures: Left finger,  Right wrist Treatments: Flonase, letrozole  ASSESSMENT:  The BMD measured at Femur Neck Left is 0.942 g/cm2 with a T-score of -0.7. This patient is considered normal according to [place] [facility]) criteria.  Site Region Measured Measured WHO Young Adult BMD Date       Age      Classification T-score AP Spine L1-L4 01/24/2015 64.3 Normal -0.2 1.165 g/cm2  DualFemur Neck Left 01/24/2015 64.3 Normal -0.7 0.942 g/cm2  World Health Organization [facility]) criteria for post-menopausal, Caucasian Women: Normal:       T-score at or above -1 SD Osteopenia:   T-score between -1 and -2.5 SD Osteoporosis: T-score at or below -2.5 SD  RECOMMENDATIONS:  [place] recommends that FDA-approved medical therapies be considered in postmenopausal women and men age 63 or older with a: 1. Hip or vertebral (clinical or morphometric) fracture. 2. T-score of < -2.5 at the spine or hip. 3. Ten-year fracture probability by FRAX of 3% or greater for hip fracture or 20% or greater for major osteoporotic fracture.  All treatment decisions require clinical judgment and consideration of individual patient factors, including patient preferences, co-morbidities,  previous drug use, risk factors not captured in the FRAX model (e.g. falls, vitamin D deficiency, increased bone turnover, interval significant decline in bone density) and possible under - or over-estimation of fracture risk by FRAX.  All patients should ensure an adequate intake of dietary calcium (1200 mg/d) and vitamin D (800 IU daily) unless contraindicated.  FOLLOW-UP: People with diagnosed cases of osteoporosis or at high risk for fracture should have regular bone mineral density tests. For patients eligible for Medicare, routine testing is allowed once every 2 years. The testing frequency can be increased to one year for patients who have rapidly progressing disease, those who are receiving or discontinuing medical therapy to restore bone mass, or have additional risk factors.   Electronically Signed   By: Earle Gell M.D.   On: 01/24/2015 11:28    ASSESSMENT: Stage IIa ER+ breast cancer with no breast lesion and axillary lymph node metastasis. (TxN1M0)  HER-2 negative.   PLAN:   1. Breast cancer: Despite no obvious breast lesion on mammogram or breast MRI, pathology and pattern of spread is consistent with primary breast cancer. CT and bone scan with no other evidence of malignancy. Will treat patient as a stage II breast cancer. Previously because of difficulty with treatment, patient only received 3 cycles of Adriamycin and Cytoxan. She did not complete 12 cycles of weekly Taxol. It was elected not to pursue axillary node dissection given the potential morbidity of this procedure. patient's This will function as neoadjuvant chemotherapy and will refer patient for axillary node dissection after treatment. It was also elected not to pursue XRT given there was no primary breast lesion. Proceed with letrozole which patient will require to take for 5 years completing in July 2021. Baseline bone mineral density as above with a T score of -0.7, which is considered normal. Repeat in 2 years. Return to  clinic in 6 weeks for survivorship visit and then in 3 months for further evaluation.  2. Family history: Patient's sister reports she has positive for Lynch syndrome and we are attempting to confirm these results. Patient may also benefit from genetic testing herself in the near future.  Patient had a colonoscopy in October 2012 that was reported as normal. 3. Nausea: Patient has been instructed to take her antiemetics on a daily basis. 4. Anemia: Likely secondary to chemotherapy.  Improved. Patient received one unit of packed red blood cells on August 07, 2014. 5. Reflux: Continue omeprazole to 40 mg twice daily. 6. Constipation: Continue stool softeners and Miralax as needed. 7. Renal insufficiency: Resolved. 8. 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 Monitor.  Patient expressed understanding and was in agreement with this plan. She also understands that She can call clinic at any time with any questions, concerns, or complaints.   Breast cancer metastasized to axillary lymph node   Staging form: Breast, AJCC 7th Edition     Clinical stage from 10/22/2014: Stage Unknown (TX, N1, M0) - Signed by [name], MD on 10/22/2014   [name], MD   02/04/2015 3:54 PM

## 2015-02-05 ENCOUNTER — Ambulatory Visit: Payer: BLUE CROSS/BLUE SHIELD | Admitting: Pain Medicine

## 2015-02-12 ENCOUNTER — Other Ambulatory Visit: Payer: Self-pay | Admitting: Pain Medicine

## 2015-02-13 ENCOUNTER — Encounter: Payer: Self-pay | Admitting: Pain Medicine

## 2015-02-13 ENCOUNTER — Ambulatory Visit: Payer: BLUE CROSS/BLUE SHIELD | Attending: Pain Medicine | Admitting: Pain Medicine

## 2015-02-13 VITALS — BP 147/78 | HR 60 | Temp 98.0°F | Resp 16 | Ht 63.0 in | Wt 185.0 lb

## 2015-02-13 DIAGNOSIS — M47817 Spondylosis without myelopathy or radiculopathy, lumbosacral region: Secondary | ICD-10-CM

## 2015-02-13 DIAGNOSIS — M47816 Spondylosis without myelopathy or radiculopathy, lumbar region: Secondary | ICD-10-CM | POA: Insufficient documentation

## 2015-02-13 DIAGNOSIS — M533 Sacrococcygeal disorders, not elsewhere classified: Secondary | ICD-10-CM

## 2015-02-13 DIAGNOSIS — M5137 Other intervertebral disc degeneration, lumbosacral region: Secondary | ICD-10-CM

## 2015-02-13 MED ORDER — CEFUROXIME AXETIL 250 MG PO TABS: 250.0000 mg | ORAL_TABLET | Freq: Two times a day (BID) | ORAL | Status: DC

## 2015-02-13 NOTE — Patient Instructions (Addendum)
Continue present medication Ultram and please obtain your antibiotic Ceftin and begin taking Ceftin antibiotic the day as prescribed  F/U PCP Dr. Venia Minks for evaliation of  BP and general medical  Condition  F/U oncology  F/U surgical evaluation  F/U neurological evaluation  May consider radiofrequency rhizolysis or intraspinal procedures pending response to present treatment and F/U evaluation   Patient to call Pain Management Center should patient have concerns prior to scheduled return appointmen. Pain Management Discharge Instructions  General Discharge Instructions :  If you need to reach your doctor call: Monday-Friday 8:00 am - 4:00 pm at [phone] or toll free [phone].  After clinic hours [phone] to have operator reach doctor.  Bring all of your medication bottles to all your appointments in the pain clinic.  To cancel or reschedule your appointment with Pain Management please remember to call 24 hours in advance to avoid a fee.  Refer to the educational materials which you have been given on: General Risks, I had my Procedure. Discharge Instructions, Post Sedation.  Post Procedure Instructions:  The drugs you were given will stay in your system until tomorrow, so for the next 24 hours you should not drive, make any legal decisions or drink any alcoholic beverages.  You may eat anything you prefer, but it is better to start with liquids then soups and crackers, and gradually work up to solid foods.  Please notify your doctor immediately if you have any unusual bleeding, trouble breathing or pain that is not related to your normal pain.  Depending on the type of procedure that was done, some parts of your body may feel week and/or numb.  This usually clears up by tonight or the next day.  Walk with the use of an assistive device or accompanied by an adult for the 24 hours.  You may use ice on the affected area for the first 24 hours.  Put ice in a Ziploc bag  and cover with a towel and place against area 15 minutes on 15 minutes off.  You may switch to heat after 24 hours.Facet Joint Block The facet joints connect the bones of the spine (vertebrae). They make it possible for you to bend, twist, and make other movements with your spine. They also prevent you from overbending, overtwisting, and making other excessive movements.  A facet joint block is a procedure where a numbing medicine (anesthetic) is injected into a facet joint. Often, a type of anti-inflammatory medicine called a steroid is also injected. A facet joint block may be done for two reasons:   Diagnosis. A facet joint block may be done as a test to see whether neck or back pain is caused by a worn-down or infected facet joint. If the pain gets better after a facet joint block, it means the pain is probably coming from the facet joint. If the pain does not get better, it means the pain is probably not coming from the facet joint.   Therapy. A facet joint block may be done to relieve neck or back pain caused by a facet joint. A facet joint block is only done as a therapy if the pain does not improve with medicine, exercise programs, physical therapy, and other forms of pain management. LET [facility] CARE PROVIDER KNOW ABOUT:   Any allergies you have.   All medicines you are taking, including vitamins, herbs, eyedrops, and over-the-counter medicines and creams.   Previous problems you or members of your family have had with the  use of anesthetics.   Any blood disorders you have had.   Other health problems you have. RISKS AND COMPLICATIONS Generally, having a facet joint block is safe. However, as with any procedure, complications can occur. Possible complications associated with having a facet joint block include:   Bleeding.   Injury to a nerve near the injection site.   Pain at the injection site.   Weakness or numbness in areas controlled by nerves near the injection  site.   Infection.   Temporary fluid retention.   Allergic reaction to anesthetics or medicines used during the procedure. BEFORE THE PROCEDURE   Follow your health care provider's instructions if you are taking dietary supplements or medicines. You may need to stop taking them or reduce your dosage.   Do not take any new dietary supplements or medicines without asking your health care provider first.   Follow your health care provider's instructions about eating and drinking before the procedure. You may need to stop eating and drinking several hours before the procedure.   Arrange to have an adult drive you home after the procedure. PROCEDURE  You may need to remove your clothing and dress in an open-back gown so that your health care provider can access your spine.   The procedure will be done while you are lying on an X-ray table. Most of the time you will be asked to lie on your stomach, but you may be asked to lie in a different position if an injection will be made in your neck.   Special machines will be used to monitor your oxygen levels, heart rate, and blood pressure.   If an injection will be made in your neck, an intravenous (IV) tube will be inserted into one of your veins. Fluids and medicine will flow directly into your body through the IV tube.   The area over the facet joint where the injection will be made will be cleaned with an antiseptic soap. The surrounding skin will be covered with sterile drapes.   An anesthetic will be applied to your skin to make the injection area numb. You may feel a temporary stinging or burning sensation.   A video X-ray machine will be used to locate the joint. A contrast dye may be injected into the facet joint area to help with locating the joint.   When the joint is located, an anesthetic medicine will be injected into the joint through the needle.   Your health care provider will ask you whether you feel pain  relief. If you do feel relief, a steroid may be injected to provide pain relief for a longer period of time. If you do not feel relief or feel only partial relief, additional injections of an anesthetic may be made in other facet joints.   The needle will be removed, the skin will be cleansed, and bandages will be applied.  AFTER THE PROCEDURE   You will be observed for 15-30 minutes before being allowed to go home. Do not drive. Have an adult drive you or take a taxi or public transportation instead.   If you feel pain relief, the pain will return in several hours or days when the anesthetic wears off.   You may feel pain relief 2-14 days after the procedure. The amount of time this relief lasts varies from person to person.   It is normal to feel some tenderness over the injected area(s) for 2 days following the procedure.   If you have  diabetes, you may have a temporary increase in blood sugar. Document Released: 11/18/2006 Document Revised: 11/13/2013 Document Reviewed: 04/18/2012 [facility] Patient Information 2015 [place], [place]. This information is not intended to replace advice given to you by your health care provider. Make sure you discuss any questions you have with your health care provider.

## 2015-02-13 NOTE — Progress Notes (Signed)
Subjective:    Patient ID: Carrie Alvarez, female    DOB: October 12, 1950, 64 y.o.   MRN: [id number]  HPI  PROCEDURE PERFORMED: Lumbar facet (medial branch block)   NOTE: The patient is a 64 y.o. female who returns to [place] for further evaluation and treatment of pain involving the lumbar and lower extremity region.  MRI  revealed the patient to be with evidence of  MRI evidence of multilevel degenerative changes of the lumbar spine with facet degenerative changes. The risks, benefits, and expectations of the procedure have been discussed and explained to the patient who was understanding and in agreement with suggested treatment plan. We will proceed with interventional treatment as discussed and as explained to the patient who was understanding and wished to proceed with procedure as planned.   DESCRIPTION OF PROCEDURE: Lumbar facet (medial branch block) with IV Versed, IV fentanyl conscious sedation, EKG, blood pressure, pulse, and pulse oximetry monitoring. The procedure was performed with the patient in the prone position. Betadine prep of proposed entry site performed.   NEEDLE PLACEMENT AT:  left L  3 lumbar facet (medial branch block). Under fluoroscopic guidance with oblique orientation of 15 degrees, a 22-gauge needle was inserted at the L  3 vertebral body level with needle placed at the targeted area of Burton's Eye or Eye of the [place] with documentation of needle placement in the superior and lateral border of targeted area of Burton's Eye or Eye of the Scotty Dog with oblique orientation of 15 degrees. Following documentation of needle placement at the L  3 vertebral body level, needle placement was then accomplished at the L  4 vertebral body level.   NEEDLE PLACEMENT AT  L4 and L5 VERTEBRAL BODY LEVELS ON THE LEFT SIDE The procedure was performed at the  L4 and L5 vertebral body levels exactly as was performed at the L  3 vertebral body level utilizing the  same technique and under fluoroscopic guidance.  NEEDLE PLACEMENT AT THE SACRAL ALA with AP view of the lumbosacral spine. With the patient in the prone position, Betadine prep of proposed entry site accomplished, a 22 gauge needle was inserted in the region of the sacral ala (groove formed by the superior articulating process of S1 and the sacral wing). Following documentation of needle placement at the sacral ala,  needle placement was then accomplished at the S1 foramen level.   NEEDLE PLACEMENT AT THE S1 FORAMEN LEVEL under fluoroscopic guidance with AP view of the lumbosacral spine and cephalad orientation of the fluoroscope, a 22-gauge needle was placed at the superior and lateral border of the S1 foramen under fluoroscopic guidance. Following documentation of needle placement at the S1 foramen.   Needle placement was then verified at all levels on lateral view. Following documentation of needle placement at all levels on lateral view and following negative aspiration for heme and CSF, each level was injected with 1 mL of 0.25% bupivacaine with Kenalog.     LUMBAR FACET, MEDIAL BRANCH NERVE, BLOCKS PERFORMED ON THE RIGHT SIDE   The procedure was performed on the right side exactly as was performed on the left side at the same levels and utilizing the same technique under fluoroscopic guidance.     The patient tolerated the procedure well. A total of 40 mg of Kenalog was utilized for the procedure.   PLAN:  1. Medications: The patient will continue presently prescribed medications tramadol 2. May consider modification of treatment regimen at time  of return appointment pending response to treatment rendered on today's visit. 3. The patient is to follow-up with primary care physician Dr. Venia Minks for further evaluation of blood pressure and general medical condition status post steroid injection performed on today's visit 4. Surgical follow-up evaluation 5. Oncological follow-up  evaluation as discussed. 6. Neurological follow-up evaluation. 7. The patient may be candidate for radiofrequency procedures, implantation type procedures, and other treatment pending response to treatment and follow-up evaluation. 8. The patient has been advised to call the Pain Management Center prior to scheduled return appointment should there be significant change in condition or should patient have other concerns regarding condition prior to scheduled return appointment.  The patient is understanding and in agreement with suggested treatment plan.      Review of Systems     Objective:   Physical Exam        Assessment & Plan:

## 2015-02-14 ENCOUNTER — Telehealth: Payer: Self-pay | Admitting: *Deleted

## 2015-02-14 NOTE — Telephone Encounter (Signed)
No problems post procedure. 

## 2015-02-26 ENCOUNTER — Encounter: Payer: Self-pay | Admitting: General Surgery

## 2015-02-26 ENCOUNTER — Ambulatory Visit (INDEPENDENT_AMBULATORY_CARE_PROVIDER_SITE_OTHER): Payer: BLUE CROSS/BLUE SHIELD | Admitting: General Surgery

## 2015-02-26 VITALS — BP 116/68 | HR 80 | Resp 12 | Ht 63.0 in | Wt 178.0 lb

## 2015-02-26 DIAGNOSIS — C50912 Malignant neoplasm of unspecified site of left female breast: Secondary | ICD-10-CM

## 2015-02-26 DIAGNOSIS — C773 Secondary and unspecified malignant neoplasm of axilla and upper limb lymph nodes: Secondary | ICD-10-CM

## 2015-02-26 NOTE — Progress Notes (Signed)
Patient ID: Carrie Alvarez, female   DOB: 1951/02/20, 64 y.o.   MRN: [id number]  Chief Complaint  Patient presents with  . Follow-up    breast cancer    HPI Carrie Alvarez is a 64 y.o. female today for breast cancer follow up. She states she has occasional shooting pains from the left axilla but the pain doesn't last long. Her last chemotherapy treatment was May. She did not have to have radiation. She is being followed by Dr. Primus Bravo for back pain.  HPI  Past Medical History  Diagnosis Date  . Hypertension   . Back pain   . Obesity   . Breast cancer   . Uterine cancer   . Allergy   . Carpal tunnel syndrome   . Depression   . GERD (gastroesophageal reflux disease)   . Anemia     Past Surgical History  Procedure Laterality Date  . Abdominal hysterectomy    . Breast surgery    . Breast biopsy Left 05/2014  . Axillary lymph node biopsy Left 12/18/2014    Procedure: AXILLARY LYMPH NODE BIOPSY/;  Surgeon: Robert Bellow, MD;  Location: [facility];  Service: General;  Laterality: Left;  . Sentinel node biopsy Left 12/18/2014    Procedure: SENTINEL NODE BIOPSY;  Surgeon: Robert Bellow, MD;  Location: [facility];  Service: General;  Laterality: Left;  . Axillary lymph node dissection Left 12/18/2014    Procedure: AXILLARY LYMPH NODE DISSECTION;  Surgeon: Robert Bellow, MD;  Location: [facility];  Service: General;  Laterality: Left;    Family History  Problem Relation Age of Onset  . Breast cancer Sister   . Colon cancer Mother   . Hypertension Mother   . Asthma Mother   . Cancer Father     Social History Social History  Substance Use Topics  . Smoking status: Former Smoker -- 0.50 packs/day    Types: Cigarettes    Quit date: 07/18/2014  . Smokeless tobacco: Never Used     Comment: given smoking and pain information   . Alcohol Use: No    No Known Allergies  Current Outpatient Prescriptions  Medication Sig Dispense Refill  . amLODipine (NORVASC)  2.5 MG tablet Take 2.5 mg by mouth every morning.   0  . fluticasone (FLONASE) 50 MCG/ACT nasal spray Place 1 spray into both nostrils as needed for allergies or rhinitis.    Marland Kitchen gabapentin (NEURONTIN) 300 MG capsule Limit 1 - 2 tabs po / day  or bid if tolerated 120 capsule 2  . letrozole (FEMARA) 2.5 MG tablet Take 1 tablet (2.5 mg total) by mouth daily. 30 tablet 11  . lidocaine-prilocaine (EMLA) cream Apply 1 application topically as needed. Apply to port then cover with saran wrap 1-2 hours prior to chemotherapy appointment 30 g 1  . loratadine (CLARITIN) 10 MG tablet Take 10 mg by mouth daily.    . meloxicam (MOBIC) 15 MG tablet TAKE ONE TABLET BY MOUTH ONCE DAILY 30 tablet 5  . omeprazole (PRILOSEC) 20 MG capsule Take 2 capsules (40 mg total) by mouth 2 (two) times daily. 120 capsule 2  . pyridOXINE (VITAMIN B-6) 100 MG tablet Take 100 mg by mouth daily.    . sertraline (ZOLOFT) 50 MG tablet TAKE FOUR TABLETS BY MOUTH AT BEDTIME 120 tablet 5  . traMADol (ULTRAM) 50 MG tablet Limit 1 tab by mouth 3-6 times per day if tolerated 180 tablet 0  . vitamin B-12 (CYANOCOBALAMIN) 500 MCG tablet  Take 500 mcg by mouth daily.     No current facility-administered medications for this visit.   Facility-Administered Medications Ordered in Other Visits  Medication Dose Route Frequency Provider Last Rate Last Dose  . heparin lock flush 100 unit/mL  500 Units Intravenous Once [name], MD      . sodium chloride 0.9 % injection 10 mL  10 mL Intracatheter PRN [name], MD   10 mL at 11/12/14 1616  . sodium chloride 0.9 % injection 10 mL  10 mL Intravenous Once [name], MD      . sodium chloride 0.9 % injection 3 mL  3 mL Intravenous PRN [name], MD   3 mL at 11/12/14 1828    Review of Systems Review of Systems  Constitutional: Negative.   Respiratory: Negative.   Cardiovascular: Negative.     Blood pressure 116/68, pulse 80, resp. rate 12, height 5\' 3"  (1.6  m), weight 178 lb (80.74 kg).  Physical Exam Physical Exam  Constitutional: She is oriented to person, place, and time. She appears well-developed and well-nourished.  HENT:  Mouth/Throat: Oropharynx is clear and moist.  Eyes: Conjunctivae are normal. No scleral icterus.  Neck: Neck supple.  Musculoskeletal:  Excellent range of motion  Lymphadenopathy:    She has no cervical adenopathy.  No left axilla seroma noted, incision well healed.  Neurological: She is alert and oriented to person, place, and time.  Skin: Skin is warm and dry.  Psychiatric: Her behavior is normal.    Data Reviewed Measurements obtained 15 cm superior to the olecranon process, as well as 10 and 20 cm below the olecranon process on each upper extremity were obtained.  06/26/2014  On the right side these measurements were 37, 31 and 21 cm respectively.  On the left upper extremity these measurements were 39.5, 32 and 22  cm.    02/26/2015  On the right side these measurements were 33, 30 and 21 cm respectively.  On the left side these measurements were 34, 28 and 20 cm respectively.   Resolution of previously identified left forearm lymphedema. Data Reviewed Biopsy shows metastatic adenocarcinoma, estrogen receptor positive.   Assessment    Metastatic breast cancer, need for central venous access.   Lymphedema left upper extremity secondary to nodal disease.    Plan    Discussed port placement. The risks associated with the procedure including vascular injury and the possible need for chest tube were reviewed.   Consideration should be given for early referral to occupational therapy if progressive edema is noted after initiation of chemotherapy.    Patient's surgery has been scheduled for 07-03-14 at [facility].  Pathology on the 12/18/2014 single axillary lymph node showed no evidence of metastatic disease, treatment effect noted.  Assessment    Doing well status post axillary node excision,  resolution of previously noted left forearm lymphedema.    Plan    A decision is made not to radiate the axilla based on review of July 2015 notes from [name], M.D.  The patient was encouraged to call if any new issues arise. Annual mammograms will be completed by Dr. Grayland Ormond.    Follow up as needed. The patient is aware to call back for any questions or concerns.   PCP:  Etheleen Mayhew 02/26/2015, 11:10 AM

## 2015-02-26 NOTE — Patient Instructions (Addendum)
The patient is aware to call back for any questions or concerns.  

## 2015-02-28 ENCOUNTER — Inpatient Hospital Stay: Payer: BLUE CROSS/BLUE SHIELD | Admitting: Oncology

## 2015-02-28 ENCOUNTER — Inpatient Hospital Stay: Payer: BLUE CROSS/BLUE SHIELD | Attending: Oncology

## 2015-02-28 DIAGNOSIS — C801 Malignant (primary) neoplasm, unspecified: Secondary | ICD-10-CM

## 2015-02-28 DIAGNOSIS — Z452 Encounter for adjustment and management of vascular access device: Secondary | ICD-10-CM | POA: Insufficient documentation

## 2015-02-28 DIAGNOSIS — C773 Secondary and unspecified malignant neoplasm of axilla and upper limb lymph nodes: Secondary | ICD-10-CM | POA: Insufficient documentation

## 2015-02-28 DIAGNOSIS — C50912 Malignant neoplasm of unspecified site of left female breast: Secondary | ICD-10-CM | POA: Insufficient documentation

## 2015-02-28 DIAGNOSIS — Z170 Estrogen receptor positive status [ER+]: Secondary | ICD-10-CM | POA: Insufficient documentation

## 2015-02-28 DIAGNOSIS — Z79811 Long term (current) use of aromatase inhibitors: Secondary | ICD-10-CM | POA: Insufficient documentation

## 2015-02-28 DIAGNOSIS — Z9221 Personal history of antineoplastic chemotherapy: Secondary | ICD-10-CM | POA: Insufficient documentation

## 2015-02-28 MED ORDER — HEPARIN SOD (PORK) LOCK FLUSH 100 UNIT/ML IV SOLN
500.0000 [IU] | Freq: Once | INTRAVENOUS | Status: AC
  Administered 2015-02-28: 500 [IU] via INTRAVENOUS
  Filled 2015-02-28: qty 5

## 2015-02-28 MED ORDER — SODIUM CHLORIDE 0.9 % IJ SOLN
10.0000 mL | INTRAMUSCULAR | Status: DC | PRN
  Administered 2015-02-28: 10 mL via INTRAVENOUS
  Filled 2015-02-28: qty 10

## 2015-02-28 NOTE — Progress Notes (Signed)
Survivorship Care Plan visit completed.  Treatment summary reviewed and given to patient.  ASCO answers Survivrohip booklet reviewed and given to patient.  Cancer transitions and CARE program along with other resources given to patient.

## 2015-03-03 NOTE — Progress Notes (Signed)
This encounter was created in error - please disregard.

## 2015-03-07 ENCOUNTER — Ambulatory Visit: Payer: BLUE CROSS/BLUE SHIELD | Attending: Pain Medicine | Admitting: Pain Medicine

## 2015-03-07 ENCOUNTER — Encounter: Payer: Self-pay | Admitting: Pain Medicine

## 2015-03-07 VITALS — BP 134/102 | HR 82 | Temp 96.9°F | Resp 16 | Ht 63.0 in | Wt 174.0 lb

## 2015-03-07 DIAGNOSIS — M47817 Spondylosis without myelopathy or radiculopathy, lumbosacral region: Secondary | ICD-10-CM

## 2015-03-07 DIAGNOSIS — M533 Sacrococcygeal disorders, not elsewhere classified: Secondary | ICD-10-CM | POA: Diagnosis not present

## 2015-03-07 DIAGNOSIS — M5137 Other intervertebral disc degeneration, lumbosacral region: Secondary | ICD-10-CM

## 2015-03-07 DIAGNOSIS — M4806 Spinal stenosis, lumbar region: Secondary | ICD-10-CM | POA: Diagnosis not present

## 2015-03-07 DIAGNOSIS — M5136 Other intervertebral disc degeneration, lumbar region: Secondary | ICD-10-CM | POA: Diagnosis not present

## 2015-03-07 DIAGNOSIS — M47816 Spondylosis without myelopathy or radiculopathy, lumbar region: Secondary | ICD-10-CM | POA: Insufficient documentation

## 2015-03-07 DIAGNOSIS — M79604 Pain in right leg: Secondary | ICD-10-CM | POA: Diagnosis present

## 2015-03-07 DIAGNOSIS — M51369 Other intervertebral disc degeneration, lumbar region without mention of lumbar back pain or lower extremity pain: Secondary | ICD-10-CM | POA: Insufficient documentation

## 2015-03-07 DIAGNOSIS — M79605 Pain in left leg: Secondary | ICD-10-CM | POA: Diagnosis present

## 2015-03-07 DIAGNOSIS — M545 Low back pain: Secondary | ICD-10-CM | POA: Diagnosis present

## 2015-03-07 MED ORDER — TRAMADOL HCL 50 MG PO TABS: ORAL_TABLET | ORAL | Status: DC

## 2015-03-07 NOTE — Progress Notes (Signed)
Discharged to home ambulatory with script in hand for tramadol.  Pre procedure instructions given with teach back 3 done.

## 2015-03-07 NOTE — Progress Notes (Signed)
Safety precautions to be maintained throughout the outpatient stay will include: orient to surroundings, keep bed in low position, maintain call bell within reach at all times, provide assistance with transfer out of bed and ambulation.  

## 2015-03-07 NOTE — Progress Notes (Signed)
   Subjective:    Patient ID: Carrie Alvarez, female    DOB: [birth year], 64 y.o.   MRN: [id number]  HPI  The patient is a 64 year old female returns to [facility] for further evaluation and treatment of pain involving the lower back and lower extremity region. Patient states that she had improvement with prior [facility]al [facility] consisting of lumbar facet, medial branch nerve, blocks. Patient notes return of pain at this time and is in hopes of being able to undergo treatment in attempt to decrease severity of symptoms, prevent return of pain to his previous severe level, and avoid the need for more involved treatment. We will also discussed patient undergoing radiofrequency rhizolysis of the lumbar facet, medial branch nerves and will request insurance approval in this regard as well. The patient will continue Neurontin and Ultram as prescribed at this time. We will proceed with interventional treatment lumbar facet, medial branch nerve, blocks at time return appointment as discussed and explained to patient is a with understanding and in agreement status treatment plan.   Review of Systems     Objective:   Physical Exam   There was mild tenderness of the splenius capitis and occipitalis muscles regions. Palpation over the region of the acromioclavicular glenohumeral joint regions were without increased pain of any significant degree. There was tenderness over the region of the cervical facet cervical paraspinal muscles region of mild degree. Palpation over the thoracic facet thoracic paraspinal muscles was a tends to palpation of mild degree in the upper and mid thoracic region and moderate degree in the lower thoracic paraspinal musculature region. There was no crepitus of the thoracic region noted. Tinel and Phalen's maneuver without increased pain of any significant degree. There was question decreased grip strength noted. Palpation  over the lumbar paraspinal muscles region lumbar facet region was a tends to palpation of moderate degree severe degree with lateral bending and rotation and extension and palpation of the lumbar facets reproducing moderate to moderately severe discomfort. There was tenderness of the PSIS and PII S region gluteal and piriformis musculature region of mild to moderate degree. Straight leg raising tolerates 30 without increased pain dorsiflexion noted. There was negative clonus negative Homans knees were with mild tends to palpation with no crepitus of the knees as well there was negative anterior and posterior drawer signs without ballottement of the patella. There was negative clonus negative Homans. Abdomen soft nontender no costovertebral angle tenderness noted.      Assessment & Plan:   Degenerative disc disease lumbar spine. Multilevel degenerative changes lumbar spine.  Lumbar facet syndrome  Lumbar stenosis  Sacroiliac joint dysfunction    Plan   Continue present medications Neurontin and Ultram  Lumbar facet, medial branch nerve, blocks to be performed at time return appointment  F/U PCP Dr. Venia Minks for evaliation of  BP, carcinoma of breast, and general medical  condition  F/U surgical evaluation  F/U Dr. Grayland Ormond  F/U neurological evaluation  May consider radiofrequency rhizolysis or intraspinal procedures pending response to present treatment and F/U evaluation   [facility] to call [facility] should patient have concerns prior to scheduled return appointmen.

## 2015-03-07 NOTE — Patient Instructions (Addendum)
Continue present medications Neurontin and Ultram  Lumbar facet, medial branch nerve, blocks to be performed at time return appointment  F/U PCP Dr. Venia Alvarez for evaliation of  BP which was elevated today and general medical  condition. See Dr. Venia Alvarez this week regarding your high blood pressure  F/U surgical evaluation  F/U neurological evaluation  Ask Caryl Pina to request insurance approval for radiofrequency rhizolysis of your lumbar facets, medial branch nerves  May consider radiofrequency rhizolysis or intraspinal procedures pending response to present treatment and F/U evaluation   Patient to call [facility] should patient have concerns prior to scheduled return appointmen. GENERAL RISKS AND COMPLICATIONS  What are the risk, side effects and possible complications? Generally speaking, most procedures are safe.  However, with any procedure there are risks, side effects, and the possibility of complications.  The risks and complications are dependent upon the sites that are lesioned, or the type of nerve block to be performed.  The closer the procedure is to the spine, the more serious the risks are.  Great care is taken when placing the radio frequency needles, block needles or lesioning probes, but sometimes complications can occur. 1. Infection: Any time there is an injection through the skin, there is a risk of infection.  This is why sterile conditions are used for these blocks.  There are four possible types of infection. 1. Localized skin infection. 2. Central Nervous System Infection-This can be in the form of Meningitis, which can be deadly. 3. Epidural Infections-This can be in the form of an epidural abscess, which can cause pressure inside of the spine, causing compression of the spinal cord with subsequent paralysis. This would require an emergency surgery to decompress, and there are no guarantees that the patient would recover from the paralysis. 4. Discitis-This is  an infection of the intervertebral discs.  It occurs in about 1% of discography procedures.  It is difficult to treat and it may lead to surgery.        2. Pain: the needles have to go through skin and soft tissues, will cause soreness.       3. Damage to internal structures:  The nerves to be lesioned may be near blood vessels or    other nerves which can be potentially damaged.       4. Bleeding: Bleeding is more common if the patient is taking blood thinners such as  aspirin, Coumadin, Ticiid, Plavix, etc., or if he/she have some genetic predisposition  such as hemophilia. Bleeding into the spinal canal can cause compression of the spinal  cord with subsequent paralysis.  This would require an emergency surgery to  decompress and there are no guarantees that the patient would recover from the  paralysis.       5. Pneumothorax:  Puncturing of a lung is a possibility, every time a needle is introduced in  the area of the chest or upper back.  Pneumothorax refers to free air around the  collapsed lung(s), inside of the thoracic cavity (chest cavity).  Another two possible  complications related to a similar event would include: Hemothorax and Chylothorax.   These are variations of the Pneumothorax, where instead of air around the collapsed  lung(s), you may have blood or chyle, respectively.       6. Spinal headaches: They may occur with any procedures in the area of the spine.       7. Persistent CSF (Cerebro-Spinal Fluid) leakage: This is a rare problem, but may  occur  with prolonged intrathecal or epidural catheters either due to the formation of a fistulous  track or a dural tear.       8. Nerve damage: By working so close to the spinal cord, there is always a possibility of  nerve damage, which could be as serious as a permanent spinal cord injury with  paralysis.       9. Death:  Although rare, severe deadly allergic reactions known as "Anaphylactic  reaction" can occur to any of the medications  used.      10. Worsening of the symptoms:  We can always make thing worse.  What are the chances of something like this happening? Chances of any of this occuring are extremely low.  By statistics, you have more of a chance of getting killed in a motor vehicle accident: while driving to the hospital than any of the above occurring .  Nevertheless, you should be aware that they are possibilities.  In general, it is similar to taking a shower.  Everybody knows that you can slip, hit your head and get killed.  Does that mean that you should not shower again?  Nevertheless always keep in mind that statistics do not mean anything if you happen to be on the wrong side of them.  Even if a procedure has a 1 (one) in a 1,000,000 (million) chance of going wrong, it you happen to be that one..Also, keep in mind that by statistics, you have more of a chance of having something go wrong when taking medications.  Who should not have this procedure? If you are on a blood thinning medication (e.g. Coumadin, Plavix, see list of "Blood Thinners"), or if you have an active infection going on, you should not have the procedure.  If you are taking any blood thinners, please inform your physician.  How should I prepare for this procedure?  Do not eat or drink anything at least six hours prior to the procedure.  Bring a driver with you .  It cannot be a taxi.  Come accompanied by an adult that can drive you back, and that is strong enough to help you if your legs get weak or numb from the local anesthetic.  Take all of your medicines the morning of the procedure with just enough water to swallow them.  If you have diabetes, make sure that you are scheduled to have your procedure done first thing in the morning, whenever possible.  If you have diabetes, take only half of your insulin dose and notify our nurse that you have done so as soon as you arrive at the clinic.  If you are diabetic, but only take blood sugar  pills (oral hypoglycemic), then do not take them on the morning of your procedure.  You may take them after you have had the procedure.  Do not take aspirin or any aspirin-containing medications, at least eleven (11) days prior to the procedure.  They may prolong bleeding.  Wear loose fitting clothing that may be easy to take off and that you would not mind if it got stained with Betadine or blood.  Do not wear any jewelry or perfume  Remove any nail coloring.  It will interfere with some of our monitoring equipment.  NOTE: Remember that this is not meant to be interpreted as a complete list of all possible complications.  Unforeseen problems may occur.  BLOOD THINNERS The following drugs contain aspirin or other products, which can cause increased bleeding  during surgery and should not be taken for 2 weeks prior to and 1 week after surgery.  If you should need take something for relief of minor pain, you may take acetaminophen which is found in Tylenol,m Datril, Anacin-3 and Panadol. It is not blood thinner. The products listed below are.  Do not take any of the products listed below in addition to any listed on your instruction sheet.  A.P.C or A.P.C with Codeine Codeine Phosphate Capsules #3 Ibuprofen Ridaura  ABC compound Congesprin Imuran rimadil  Advil Cope Indocin Robaxisal  Alka-Seltzer Effervescent Pain Reliever and Antacid Coricidin or Coricidin-D  Indomethacin Rufen  Alka-Seltzer plus Cold Medicine Cosprin Ketoprofen S-A-C Tablets  Anacin Analgesic Tablets or Capsules Coumadin Korlgesic Salflex  Anacin Extra Strength Analgesic tablets or capsules CP-2 Tablets Lanoril Salicylate  Anaprox Cuprimine Capsules Levenox Salocol  Anexsia-D Dalteparin Magan Salsalate  Anodynos Darvon compound Magnesium Salicylate Sine-off  Ansaid Dasin Capsules Magsal Sodium Salicylate  Anturane Depen Capsules Marnal Soma  APF Arthritis pain formula Dewitt's Pills Measurin Stanback  Argesic Dia-Gesic  Meclofenamic Sulfinpyrazone  Arthritis Bayer Timed Release Aspirin Diclofenac Meclomen Sulindac  Arthritis pain formula Anacin Dicumarol Medipren Supac  Analgesic (Safety coated) Arthralgen Diffunasal Mefanamic Suprofen  Arthritis Strength Bufferin Dihydrocodeine Mepro Compound Suprol  Arthropan liquid Dopirydamole Methcarbomol with Aspirin Synalgos  ASA tablets/Enseals Disalcid Micrainin Tagament  Ascriptin Doan's Midol Talwin  Ascriptin A/D Dolene Mobidin Tanderil  Ascriptin Extra Strength Dolobid Moblgesic Ticlid  Ascriptin with Codeine Doloprin or Doloprin with Codeine Momentum Tolectin  Asperbuf Duoprin Mono-gesic Trendar  Aspergum Duradyne Motrin or Motrin IB Triminicin  Aspirin plain, buffered or enteric coated Durasal Myochrisine Trigesic  Aspirin Suppositories Easprin Nalfon Trillsate  Aspirin with Codeine Ecotrin Regular or Extra Strength Naprosyn Uracel  Atromid-S Efficin Naproxen Ursinus  Auranofin Capsules Elmiron Neocylate Vanquish  Axotal Emagrin Norgesic Verin  Azathioprine Empirin or Empirin with Codeine Normiflo Vitamin E  Azolid Emprazil Nuprin Voltaren  Bayer Aspirin plain, buffered or children's or timed BC Tablets or powders Encaprin Orgaran Warfarin Sodium  Buff-a-Comp Enoxaparin Orudis Zorpin  Buff-a-Comp with Codeine Equegesic Os-Cal-Gesic   Buffaprin Excedrin plain, buffered or Extra Strength Oxalid   Bufferin Arthritis Strength Feldene Oxphenbutazone   Bufferin plain or Extra Strength Feldene Capsules Oxycodone with Aspirin   Bufferin with Codeine Fenoprofen Fenoprofen Pabalate or Pabalate-SF   Buffets II Flogesic Panagesic   Buffinol plain or Extra Strength Florinal or Florinal with Codeine Panwarfarin   Buf-Tabs Flurbiprofen Penicillamine   Butalbital Compound Four-way cold tablets Penicillin   Butazolidin Fragmin Pepto-Bismol   Carbenicillin Geminisyn Percodan   Carna Arthritis Reliever Geopen Persantine   Carprofen Gold's salt Persistin    Chloramphenicol Goody's Phenylbutazone   Chloromycetin Haltrain Piroxlcam   Clmetidine heparin Plaquenil   Cllnoril Hyco-pap Ponstel   Clofibrate Hydroxy chloroquine Propoxyphen         Before stopping any of these medications, be sure to consult the physician who ordered them.  Some, such as Coumadin (Warfarin) are ordered to prevent or treat serious conditions such as "deep thrombosis", "pumonary embolisms", and other heart problems.  The amount of time that you may need off of the medication may also vary with the medication and the reason for which you were taking it.  If you are taking any of these medications, please make sure you notify your pain physician before you undergo any procedures.         Facet Joint Block The facet joints connect the bones of the spine (vertebrae). They make it  possible for you to bend, twist, and make other movements with your spine. They also prevent you from overbending, overtwisting, and making other excessive movements.  A facet joint block is a procedure where a numbing medicine (anesthetic) is injected into a facet joint. Often, a type of anti-inflammatory medicine called a steroid is also injected. A facet joint block may be done for two reasons:  2. Diagnosis. A facet joint block may be done as a test to see whether neck or back pain is caused by a worn-down or infected facet joint. If the pain gets better after a facet joint block, it means the pain is probably coming from the facet joint. If the pain does not get better, it means the pain is probably not coming from the facet joint.  3. Therapy. A facet joint block may be done to relieve neck or back pain caused by a facet joint. A facet joint block is only done as a therapy if the pain does not improve with medicine, exercise programs, physical therapy, and other forms of pain management. LET [facility] CARE PROVIDER KNOW ABOUT:   Any allergies you have.   All medicines you are taking,  including vitamins, herbs, eyedrops, and over-the-counter medicines and creams.   Previous problems you or members of your family have had with the use of anesthetics.   Any blood disorders you have had.   Other health problems you have. RISKS AND COMPLICATIONS Generally, having a facet joint block is safe. However, as with any procedure, complications can occur. Possible complications associated with having a facet joint block include:   Bleeding.   Injury to a nerve near the injection site.   Pain at the injection site.   Weakness or numbness in areas controlled by nerves near the injection site.   Infection.   Temporary fluid retention.   Allergic reaction to anesthetics or medicines used during the procedure. BEFORE THE PROCEDURE   Follow your health care provider's instructions if you are taking dietary supplements or medicines. You may need to stop taking them or reduce your dosage.   Do not take any new dietary supplements or medicines without asking your health care provider first.   Follow your health care provider's instructions about eating and drinking before the procedure. You may need to stop eating and drinking several hours before the procedure.   Arrange to have an adult drive you home after the procedure. PROCEDURE 12. You may need to remove your clothing and dress in an open-back gown so that your health care provider can access your spine.  13. The procedure will be done while you are lying on an X-ray table. Most of the time you will be asked to lie on your stomach, but you may be asked to lie in a different position if an injection will be made in your neck.  14. Special machines will be used to monitor your oxygen levels, heart rate, and blood pressure.  15. If an injection will be made in your neck, an intravenous (IV) tube will be inserted into one of your veins. Fluids and medicine will flow directly into your body through the IV tube.   16. The area over the facet joint where the injection will be made will be cleaned with an antiseptic soap. The surrounding skin will be covered with sterile drapes.  17. An anesthetic will be applied to your skin to make the injection area numb. You may feel a temporary stinging or burning sensation.  18. A video X-ray machine will be used to locate the joint. A contrast dye may be injected into the facet joint area to help with locating the joint.  19. When the joint is located, an anesthetic medicine will be injected into the joint through the needle.  52. Your health care provider will ask you whether you feel pain relief. If you do feel relief, a steroid may be injected to provide pain relief for a longer period of time. If you do not feel relief or feel only partial relief, additional injections of an anesthetic may be made in other facet joints.  21. The needle will be removed, the skin will be cleansed, and bandages will be applied.  AFTER THE PROCEDURE   You will be observed for 15-30 minutes before being allowed to go home. Do not drive. Have an adult drive you or take a taxi or public transportation instead.   If you feel pain relief, the pain will return in several hours or days when the anesthetic wears off.   You may feel pain relief 2-14 days after the procedure. The amount of time this relief lasts varies from person to person.   It is normal to feel some tenderness over the injected area(s) for 2 days following the procedure.   If you have diabetes, you may have a temporary increase in blood sugar. Document Released: 11/18/2006 Document Revised: 11/13/2013 Document Reviewed: 04/18/2012 [facility] Patient Information 2015 [place], [place]. This information is not intended to replace advice given to you by your health care provider. Make sure you discuss any questions you have with your health care provider.

## 2015-03-12 ENCOUNTER — Other Ambulatory Visit: Payer: Self-pay | Admitting: Pain Medicine

## 2015-03-12 DIAGNOSIS — M533 Sacrococcygeal disorders, not elsewhere classified: Secondary | ICD-10-CM

## 2015-03-12 DIAGNOSIS — M47817 Spondylosis without myelopathy or radiculopathy, lumbosacral region: Secondary | ICD-10-CM

## 2015-03-12 DIAGNOSIS — M5137 Other intervertebral disc degeneration, lumbosacral region: Secondary | ICD-10-CM

## 2015-03-15 ENCOUNTER — Other Ambulatory Visit: Payer: Self-pay | Admitting: Family Medicine

## 2015-03-15 DIAGNOSIS — I10 Essential (primary) hypertension: Secondary | ICD-10-CM | POA: Insufficient documentation

## 2015-03-15 NOTE — Telephone Encounter (Signed)
Last OV 09/2014   

## 2015-03-19 ENCOUNTER — Telehealth: Payer: Self-pay | Admitting: Pain Medicine

## 2015-03-19 NOTE — Telephone Encounter (Signed)
Had to cancel procedure appt due to ins/ [employer] has canceled her policy

## 2015-03-19 NOTE — Telephone Encounter (Signed)
Thank you :)

## 2015-03-20 ENCOUNTER — Ambulatory Visit: Payer: BLUE CROSS/BLUE SHIELD | Admitting: Pain Medicine

## 2015-04-09 ENCOUNTER — Encounter: Payer: BLUE CROSS/BLUE SHIELD | Admitting: Pain Medicine

## 2015-04-09 ENCOUNTER — Telehealth: Payer: Self-pay | Admitting: Pain Medicine

## 2015-04-09 NOTE — Telephone Encounter (Signed)
I called Carrie Alvarez / her ins canceled and she is in process of getting signed up with new one / she will call when this is taken care of

## 2015-04-09 NOTE — Telephone Encounter (Signed)
Thank you very much Kathy 

## 2015-04-11 ENCOUNTER — Inpatient Hospital Stay: Payer: BLUE CROSS/BLUE SHIELD

## 2015-04-18 ENCOUNTER — Inpatient Hospital Stay: Payer: Self-pay | Attending: Oncology

## 2015-04-18 DIAGNOSIS — Z79811 Long term (current) use of aromatase inhibitors: Secondary | ICD-10-CM | POA: Insufficient documentation

## 2015-04-18 DIAGNOSIS — C773 Secondary and unspecified malignant neoplasm of axilla and upper limb lymph nodes: Secondary | ICD-10-CM | POA: Insufficient documentation

## 2015-04-18 DIAGNOSIS — C801 Malignant (primary) neoplasm, unspecified: Secondary | ICD-10-CM

## 2015-04-18 DIAGNOSIS — Z452 Encounter for adjustment and management of vascular access device: Secondary | ICD-10-CM | POA: Insufficient documentation

## 2015-04-18 DIAGNOSIS — C50912 Malignant neoplasm of unspecified site of left female breast: Secondary | ICD-10-CM | POA: Insufficient documentation

## 2015-04-18 DIAGNOSIS — Z9221 Personal history of antineoplastic chemotherapy: Secondary | ICD-10-CM | POA: Insufficient documentation

## 2015-04-18 DIAGNOSIS — Z170 Estrogen receptor positive status [ER+]: Secondary | ICD-10-CM | POA: Insufficient documentation

## 2015-04-18 MED ORDER — SODIUM CHLORIDE 0.9 % IJ SOLN
10.0000 mL | Freq: Once | INTRAMUSCULAR | Status: AC
  Administered 2015-04-18: 10 mL via INTRAVENOUS
  Filled 2015-04-18: qty 10

## 2015-04-18 MED ORDER — HEPARIN SOD (PORK) LOCK FLUSH 100 UNIT/ML IV SOLN
INTRAVENOUS | Status: AC
  Filled 2015-04-18: qty 5

## 2015-04-18 MED ORDER — HEPARIN SOD (PORK) LOCK FLUSH 100 UNIT/ML IV SOLN
500.0000 [IU] | Freq: Once | INTRAVENOUS | Status: AC
  Administered 2015-04-18: 500 [IU] via INTRAVENOUS

## 2015-04-22 ENCOUNTER — Other Ambulatory Visit: Payer: Self-pay | Admitting: *Deleted

## 2015-04-22 ENCOUNTER — Other Ambulatory Visit: Payer: Self-pay | Admitting: Oncology

## 2015-04-22 DIAGNOSIS — C50919 Malignant neoplasm of unspecified site of unspecified female breast: Secondary | ICD-10-CM

## 2015-04-22 DIAGNOSIS — C773 Secondary and unspecified malignant neoplasm of axilla and upper limb lymph nodes: Principal | ICD-10-CM

## 2015-04-22 MED ORDER — LETROZOLE 2.5 MG PO TABS: 2.5000 mg | ORAL_TABLET | Freq: Every day | ORAL | Status: DC

## 2015-05-23 ENCOUNTER — Inpatient Hospital Stay: Payer: Self-pay | Attending: Oncology

## 2015-05-23 ENCOUNTER — Inpatient Hospital Stay (HOSPITAL_BASED_OUTPATIENT_CLINIC_OR_DEPARTMENT_OTHER): Payer: Self-pay | Admitting: Oncology

## 2015-05-23 VITALS — BP 168/121 | HR 87 | Temp 96.2°F | Resp 20 | Wt 181.0 lb

## 2015-05-23 DIAGNOSIS — D649 Anemia, unspecified: Secondary | ICD-10-CM | POA: Insufficient documentation

## 2015-05-23 DIAGNOSIS — T451X5S Adverse effect of antineoplastic and immunosuppressive drugs, sequela: Secondary | ICD-10-CM | POA: Insufficient documentation

## 2015-05-23 DIAGNOSIS — Z9221 Personal history of antineoplastic chemotherapy: Secondary | ICD-10-CM

## 2015-05-23 DIAGNOSIS — Z87891 Personal history of nicotine dependence: Secondary | ICD-10-CM | POA: Insufficient documentation

## 2015-05-23 DIAGNOSIS — Z79811 Long term (current) use of aromatase inhibitors: Secondary | ICD-10-CM | POA: Insufficient documentation

## 2015-05-23 DIAGNOSIS — G620 Drug-induced polyneuropathy: Secondary | ICD-10-CM

## 2015-05-23 DIAGNOSIS — C773 Secondary and unspecified malignant neoplasm of axilla and upper limb lymph nodes: Secondary | ICD-10-CM | POA: Insufficient documentation

## 2015-05-23 DIAGNOSIS — I10 Essential (primary) hypertension: Secondary | ICD-10-CM

## 2015-05-23 DIAGNOSIS — F418 Other specified anxiety disorders: Secondary | ICD-10-CM | POA: Insufficient documentation

## 2015-05-23 DIAGNOSIS — C50919 Malignant neoplasm of unspecified site of unspecified female breast: Secondary | ICD-10-CM

## 2015-05-23 DIAGNOSIS — Z452 Encounter for adjustment and management of vascular access device: Secondary | ICD-10-CM | POA: Insufficient documentation

## 2015-05-23 DIAGNOSIS — C50912 Malignant neoplasm of unspecified site of left female breast: Secondary | ICD-10-CM | POA: Insufficient documentation

## 2015-05-23 DIAGNOSIS — Z170 Estrogen receptor positive status [ER+]: Secondary | ICD-10-CM | POA: Insufficient documentation

## 2015-05-23 DIAGNOSIS — K219 Gastro-esophageal reflux disease without esophagitis: Secondary | ICD-10-CM

## 2015-05-23 DIAGNOSIS — Z79899 Other long term (current) drug therapy: Secondary | ICD-10-CM | POA: Insufficient documentation

## 2015-05-23 MED ORDER — HEPARIN SOD (PORK) LOCK FLUSH 100 UNIT/ML IV SOLN
500.0000 [IU] | Freq: Once | INTRAVENOUS | Status: AC
  Administered 2015-05-23: 500 [IU] via INTRAVENOUS

## 2015-05-23 MED ORDER — SODIUM CHLORIDE 0.9 % IJ SOLN
10.0000 mL | Freq: Once | INTRAMUSCULAR | Status: AC
  Administered 2015-05-23: 10 mL via INTRAVENOUS
  Filled 2015-05-23: qty 10

## 2015-05-23 NOTE — Progress Notes (Signed)
Patient has lost her medical insurance in 12/2014 and has already spoken to [facility]'s social worker who has submitted assistance for her to receive Letrozole and a discounted price.  Her blood pressure is hight today but she does not offer any symptoms and I advised her to contact her PCP and inform them of her insurance situation they may offer a self-pay program.  She is also due for her mammogram so I called the nurse navigator, Gregery Na, who advised to put the mammogram order in as usual but put comment that it will be covered with the [facility].  Still having neuropathy in her feet and left hand fingers but is only taking the Gabapentin sparingly due to finances.

## 2015-06-10 NOTE — Progress Notes (Signed)
[place]  [phone] [phone]  ID: Norm Salt OB: [birth year]  MR#: [id number]  TGY#:[id number]  Patient Care Team: Margarita Rana, MD as PCP - General (Family Medicine) [name], MD as Consulting Physician (Oncology) Robert Bellow, MD (General Surgery)  CHIEF COMPLAINT:  Chief Complaint  Patient presents with  . Breast Cancer    INTERVAL HISTORY: Patient returns to clinic today for routine evaluation. She has increased anxiety secondary to recently losing her health insurance. She continues to have a peripheral neuropathy, but only takes her gabapentin intermittently secondary to cost. She has no neurologic complaints. She denies any recent fevers. She denies any pain. She has no chest pain or shortness of breath. She denies any nausea, vomiting, constipation, or diarrhea.  She has no urinary complaints. Patient offers no further specific complaints today.    REVIEW OF SYSTEMS:   Review of Systems  Constitutional: Negative.   Respiratory: Negative.   Cardiovascular: Negative.   Gastrointestinal: Negative.   Neurological: Positive for sensory change. Negative for dizziness and headaches.  Psychiatric/Behavioral: The patient is nervous/anxious.     As per HPI. Otherwise, a complete review of systems is negatve.  PAST MEDICAL HISTORY: Past Medical History  Diagnosis Date  . Hypertension   . Back pain   . Obesity   . Breast cancer   . Uterine cancer   . Allergy   . Carpal tunnel syndrome   . Depression   . GERD (gastroesophageal reflux disease)   . Anemia     PAST SURGICAL HISTORY: Past Surgical History  Procedure Laterality Date  . Abdominal hysterectomy    . Breast surgery    . Breast biopsy Left 05/2014  . Axillary lymph node biopsy Left 12/18/2014    Procedure: AXILLARY LYMPH NODE BIOPSY/;  Surgeon: Robert Bellow, MD;  Location: [facility] ORS;  Service: General;  Laterality: Left;  . Sentinel  node biopsy Left 12/18/2014    Procedure: SENTINEL NODE BIOPSY;  Surgeon: Robert Bellow, MD;  Location: [facility] ORS;  Service: General;  Laterality: Left;  . Axillary lymph node dissection Left 12/18/2014    Procedure: AXILLARY LYMPH NODE DISSECTION;  Surgeon: Robert Bellow, MD;  Location: [facility] ORS;  Service: General;  Laterality: Left;    FAMILY HISTORY Family History  Problem Relation Age of Onset  . Breast cancer Sister   . Colon cancer Mother   . Hypertension Mother   . Asthma Mother   . Cancer Father        ADVANCED DIRECTIVES:    HEALTH MAINTENANCE: Social History  Substance Use Topics  . Smoking status: Former Smoker -- 0.50 packs/day    Types: Cigarettes    Quit date: 07/18/2014  . Smokeless tobacco: Never Used     Comment: given smoking and pain information   . Alcohol Use: No     Colonoscopy:  PAP:  Bone density:  Lipid panel:  No Known Allergies  Current Outpatient Prescriptions  Medication Sig Dispense Refill  . amLODipine (NORVASC) 2.5 MG tablet TAKE ONE TABLET BY MOUTH ONCE DAILY 90 tablet 3  . fluticasone (FLONASE) 50 MCG/ACT nasal spray Place 1 spray into both nostrils as needed for allergies or rhinitis.    Marland Kitchen gabapentin (NEURONTIN) 300 MG capsule Limit 1 - 2 tabs po / day  or bid if tolerated 120 capsule 2  . letrozole (FEMARA) 2.5 MG tablet Take 1 tablet (2.5 mg total) by mouth daily. 90 tablet 3  . lidocaine-prilocaine (  EMLA) cream Apply 1 application topically as needed. Apply to port then cover with saran wrap 1-2 hours prior to chemotherapy appointment 30 g 1  . loratadine (CLARITIN) 10 MG tablet Take 10 mg by mouth daily.    . meloxicam (MOBIC) 15 MG tablet TAKE ONE TABLET BY MOUTH ONCE DAILY 30 tablet 5  . omeprazole (PRILOSEC) 20 MG capsule Take 2 capsules (40 mg total) by mouth 2 (two) times daily. 120 capsule 2  . pyridOXINE (VITAMIN B-6) 100 MG tablet Take 100 mg by mouth daily.    . sertraline (ZOLOFT) 50 MG tablet TAKE FOUR TABLETS BY  MOUTH AT BEDTIME 120 tablet 5  . traMADol (ULTRAM) 50 MG tablet Limit 1 tab by mouth 3-6 times per day if tolerated 180 tablet 0  . vitamin B-12 (CYANOCOBALAMIN) 500 MCG tablet Take 500 mcg by mouth daily.     No current facility-administered medications for this visit.   Facility-Administered Medications Ordered in Other Visits  Medication Dose Route Frequency Provider Last Rate Last Dose  . heparin lock flush 100 unit/mL  500 Units Intravenous Once [name], MD      . sodium chloride 0.9 % injection 10 mL  10 mL Intracatheter PRN [name], MD   10 mL at 11/12/14 1616  . sodium chloride 0.9 % injection 10 mL  10 mL Intravenous Once [name], MD      . sodium chloride 0.9 % injection 3 mL  3 mL Intravenous PRN [name], MD   3 mL at 11/12/14 1828    OBJECTIVE: Filed Vitals:   05/23/15 1538  BP: 168/121  Pulse: 87  Temp: 96.2 F (35.7 C)  Resp: 20     Body mass index is 32.07 kg/(m^2).    ECOG FS:1 - Symptomatic but completely ambulatory  General: Well-developed, well-nourished, no acute distress. Eyes: anicteric sclera. Breasts: Exam deferred today. Lungs: Clear to auscultation bilaterally. Heart: Regular rate and rhythm. No rubs, murmurs, or gallops. Abdomen: Soft, nontender, nondistended. No organomegaly noted, normoactive bowel sounds. Musculoskeletal: No edema, cyanosis, or clubbing. Neuro: Alert, answering all questions appropriately. Cranial nerves grossly intact. Skin: No rashes or petechiae noted. Psych: Normal affect.    LAB RESULTS:  Lab Results  Component Value Date   NA 138 11/19/2014   K 3.9 11/19/2014   CL 108 11/19/2014   CO2 25 11/19/2014   GLUCOSE 83 11/19/2014   BUN 18 11/19/2014   CREATININE 1.03* 11/19/2014   CALCIUM 9.1 11/19/2014   PROT 7.4 08/20/2014   ALBUMIN 3.9 08/20/2014   AST 33 08/20/2014   ALT 28 08/20/2014   ALKPHOS 103 08/20/2014   BILITOT 2.4* 08/20/2014   GFRNONAA 56* 11/19/2014   GFRAA  >60 11/19/2014    Lab Results  Component Value Date   WBC 5.5 11/19/2014   NEUTROABS 3.8 11/19/2014   HGB 10.1* 11/19/2014   HCT 31.0* 11/19/2014   MCV 88.4 11/19/2014   PLT 244 11/19/2014     STUDIES: No results found.  ASSESSMENT: Stage IIa ER+ breast cancer with no breast lesion and axillary lymph node metastasis. (TxN1M0)  HER-2 negative.   PLAN:   1. Breast cancer: Despite no obvious breast lesion on mammogram or breast MRI, pathology and pattern of spread was consistent with primary breast cancer. CT and bone scan with no other evidence of malignancy. Previously because of difficulty with treatment, patient only received 3 cycles of Adriamycin and Cytoxan. She did not complete 12 cycles of weekly  Taxol. It was elected not to pursue axillary node dissection given the potential morbidity of this procedure. patient's. It was also elected not to pursue adjuvant XRT given there was no primary breast lesion. Proceed with letrozole which patient will require to take for 5 years completing in July 2021. Baseline bone mineral density on January 24, 2015 with a T score of -0.7, which is considered normal. Repeat in 2 years. Because of patient's problems with insurance, she has requested less frequent follow-up and will return to clinic in March 2017 after her Medicare is activated.  Patient will require mammogram in the next several weeks which has been covered by the [employer]. 2. Family history: Patient's sister reports she has positive for Lynch syndrome and we are attempting to confirm these results. Patient may also benefit from genetic testing herself in the near future.  Patient had a colonoscopy in October 2012 that was reported as normal. 3. Anemia: Likely secondary to chemotherapy. Improved. Patient received one unit of packed red blood cells on August 07, 2014. 4. Hypertension: Patient admits to not taking her blood pressure medications as prescribed secondary to financial reasons. She  has been instructed to call her PCP for further evaluation. 5. Peripheral neuropathy: Secondary to Taxol. Patient is also taking her gabapentin intermittently for financial reasons. Continue to monitor.   Patient expressed understanding and was in agreement with this plan. She also understands that She can call clinic at any time with any questions, concerns, or complaints.   Breast cancer metastasized to axillary lymph node   Staging form: Breast, AJCC 7th Edition     Clinical stage from 10/22/2014: Stage Unknown (TX, N1, M0) - Signed by [name], MD on 10/22/2014   [name], MD   06/10/2015 9:50 PM

## 2015-06-20 ENCOUNTER — Ambulatory Visit
Admission: RE | Admit: 2015-06-20 | Discharge: 2015-06-20 | Disposition: A | Discharge status: Home / Self Care | Payer: Self-pay | Source: Ambulatory Visit | Attending: Oncology | Admitting: Oncology

## 2015-06-20 ENCOUNTER — Other Ambulatory Visit: Payer: Self-pay | Admitting: Oncology

## 2015-06-20 DIAGNOSIS — C773 Secondary and unspecified malignant neoplasm of axilla and upper limb lymph nodes: Principal | ICD-10-CM | POA: Insufficient documentation

## 2015-06-20 DIAGNOSIS — C50919 Malignant neoplasm of unspecified site of unspecified female breast: Secondary | ICD-10-CM

## 2015-07-04 ENCOUNTER — Inpatient Hospital Stay: Payer: Self-pay | Attending: Oncology

## 2015-07-04 DIAGNOSIS — C801 Malignant (primary) neoplasm, unspecified: Secondary | ICD-10-CM

## 2015-07-04 DIAGNOSIS — Z9221 Personal history of antineoplastic chemotherapy: Secondary | ICD-10-CM | POA: Insufficient documentation

## 2015-07-04 DIAGNOSIS — C773 Secondary and unspecified malignant neoplasm of axilla and upper limb lymph nodes: Secondary | ICD-10-CM | POA: Insufficient documentation

## 2015-07-04 DIAGNOSIS — C50912 Malignant neoplasm of unspecified site of left female breast: Secondary | ICD-10-CM | POA: Insufficient documentation

## 2015-07-04 DIAGNOSIS — Z452 Encounter for adjustment and management of vascular access device: Secondary | ICD-10-CM | POA: Insufficient documentation

## 2015-07-04 DIAGNOSIS — Z170 Estrogen receptor positive status [ER+]: Secondary | ICD-10-CM | POA: Insufficient documentation

## 2015-07-04 DIAGNOSIS — Z79811 Long term (current) use of aromatase inhibitors: Secondary | ICD-10-CM | POA: Insufficient documentation

## 2015-07-04 MED ORDER — SODIUM CHLORIDE 0.9 % IJ SOLN
10.0000 mL | INTRAMUSCULAR | Status: DC | PRN
  Administered 2015-07-04: 10 mL via INTRAVENOUS
  Filled 2015-07-04: qty 10

## 2015-07-04 MED ORDER — HEPARIN SOD (PORK) LOCK FLUSH 100 UNIT/ML IV SOLN
500.0000 [IU] | Freq: Once | INTRAVENOUS | Status: AC
  Administered 2015-07-04: 500 [IU] via INTRAVENOUS
  Filled 2015-07-04: qty 5

## 2015-07-10 ENCOUNTER — Other Ambulatory Visit: Payer: Self-pay | Admitting: Nurse Practitioner

## 2015-08-15 ENCOUNTER — Inpatient Hospital Stay: Payer: Self-pay | Attending: Oncology

## 2015-08-16 ENCOUNTER — Other Ambulatory Visit: Payer: Self-pay | Admitting: Family Medicine

## 2015-08-16 DIAGNOSIS — M5136 Other intervertebral disc degeneration, lumbar region: Secondary | ICD-10-CM

## 2015-09-03 ENCOUNTER — Encounter: Payer: Self-pay | Admitting: Emergency Medicine

## 2015-09-03 ENCOUNTER — Emergency Department
Admission: EM | Admit: 2015-09-03 | Discharge: 2015-09-03 | Disposition: A | Discharge status: Home / Self Care | Payer: Medicare Other | Attending: Student | Admitting: Student

## 2015-09-03 ENCOUNTER — Emergency Department: Payer: Medicare Other

## 2015-09-03 DIAGNOSIS — Z79899 Other long term (current) drug therapy: Secondary | ICD-10-CM | POA: Insufficient documentation

## 2015-09-03 DIAGNOSIS — Z791 Long term (current) use of non-steroidal anti-inflammatories (NSAID): Secondary | ICD-10-CM | POA: Diagnosis not present

## 2015-09-03 DIAGNOSIS — Z87891 Personal history of nicotine dependence: Secondary | ICD-10-CM | POA: Diagnosis not present

## 2015-09-03 DIAGNOSIS — R42 Dizziness and giddiness: Secondary | ICD-10-CM | POA: Diagnosis present

## 2015-09-03 DIAGNOSIS — R51 Headache: Secondary | ICD-10-CM | POA: Diagnosis not present

## 2015-09-03 DIAGNOSIS — I10 Essential (primary) hypertension: Secondary | ICD-10-CM | POA: Diagnosis not present

## 2015-09-03 LAB — URINALYSIS COMPLETE WITH MICROSCOPIC (ARMC ONLY)
Bacteria, UA: NONE SEEN
Bilirubin Urine: NEGATIVE
Glucose, UA: NEGATIVE mg/dL
Hgb urine dipstick: NEGATIVE
Ketones, ur: NEGATIVE mg/dL
Nitrite: NEGATIVE
Protein, ur: NEGATIVE mg/dL
Specific Gravity, Urine: 1.014 (ref 1.005–1.030)
pH: 5 (ref 5.0–8.0)

## 2015-09-03 LAB — BASIC METABOLIC PANEL
Anion gap: 8 (ref 5–15)
BUN: 17 mg/dL (ref 6–20)
CO2: 23 mmol/L (ref 22–32)
Calcium: 9.2 mg/dL (ref 8.9–10.3)
Chloride: 109 mmol/L (ref 101–111)
Creatinine, Ser: 0.9 mg/dL (ref 0.44–1.00)
GFR calc Af Amer: >60 mL/min (ref >60)
GFR calc non Af Amer: >60 mL/min (ref >60)
Glucose, Bld: 110 mg/dL — ABNORMAL HIGH (ref 65–99)
Potassium: 3.2 mmol/L — ABNORMAL LOW (ref 3.5–5.1)
Sodium: 140 mmol/L (ref 135–145)

## 2015-09-03 LAB — CBC
HCT: 33.6 % — ABNORMAL LOW (ref 35.0–47.0)
Hemoglobin: 11.4 g/dL — ABNORMAL LOW (ref 12.0–16.0)
MCH: 27.9 pg (ref 26.0–34.0)
MCHC: 33.9 g/dL (ref 32.0–36.0)
MCV: 82.4 fL (ref 80.0–100.0)
Platelets: 210 10*3/uL (ref 150–440)
RBC: 4.08 MIL/uL (ref 3.80–5.20)
RDW: 13.7 % (ref 11.5–14.5)
WBC: 8.7 10*3/uL (ref 3.6–11.0)

## 2015-09-03 MED ORDER — SODIUM CHLORIDE 0.9 % IV BOLUS (SEPSIS)
1000.0000 mL | Freq: Once | INTRAVENOUS | Status: AC
  Administered 2015-09-03: 1000 mL via INTRAVENOUS

## 2015-09-03 MED ORDER — MECLIZINE HCL 25 MG PO TABS
ORAL_TABLET | ORAL | Status: AC
  Filled 2015-09-03: qty 1

## 2015-09-03 MED ORDER — MECLIZINE HCL 25 MG PO TABS
25.0000 mg | ORAL_TABLET | Freq: Once | ORAL | Status: AC
  Administered 2015-09-03: 25 mg via ORAL

## 2015-09-03 MED ORDER — MECLIZINE HCL 12.5 MG PO TABS: 12.5000 mg | ORAL_TABLET | Freq: Three times a day (TID) | ORAL | Status: DC | PRN

## 2015-09-03 NOTE — ED Notes (Signed)
Pt reports hypertension that began today, reports headache, like her head "is swimming and cannot hold it up" also blurred vision. States she took all her medication as prescribed.denies LOC

## 2015-09-03 NOTE — ED Notes (Signed)
Pt to ed with c/o dizziness, blurred vision, headache, and HTN.  At home pt states BP was 152/103.  Pt denies chest pain.

## 2015-09-03 NOTE — ED Provider Notes (Signed)
[facility] Emergency Department Provider Note  ____________________________________________  Time seen: Approximately 6:32 PM  I have reviewed the triage vital signs and the nursing notes.   HISTORY  Chief Complaint Dizziness    HPI Carrie Alvarez is a 65 y.o. female with history of hypertension, GERD who presents for evaluation of intermittent room spinning dizziness as well as lightheadedness today, gradual onset, intermittent, worse with position change/sitting up or standing, currently moderate. She also took her blood pressure today and it was 152/103. She has mild headache, no nausea, vomiting, diarrhea, fevers, chills, chest pain or shortness of breath. No numbness or weakness. She reports chronic we blurred vision secondary to cataracts but this is not changed from it's baseline.   Past Medical History  Diagnosis Date  . Hypertension   . Back pain   . Obesity   . Allergy   . Carpal tunnel syndrome   . Depression   . GERD (gastroesophageal reflux disease)   . Anemia   . Uterine cancer ([place]) 1994  . Breast cancer ([place]) 2015    LT LUMPECTOMY 12-2014 FOLLOWING CHEMO    Patient Active Problem List   Diagnosis Date Noted  . BP (high blood pressure) 03/15/2015  . DDD (degenerative disc disease), lumbar 03/07/2015  . Dizziness 12/25/2014  . Lumbosacral facet joint syndrome 12/10/2014  . Sacroiliac joint dysfunction 12/10/2014  . DDD (degenerative disc disease), lumbosacral 11/13/2014  . Clinical depression 10/13/2014  . Breast cancer metastasized to axillary lymph node ([place]) 06/21/2014  . Hypercholesteremia 08/30/2009    Past Surgical History  Procedure Laterality Date  . Breast surgery    . Axillary lymph node biopsy Left 12/18/2014    Procedure: AXILLARY LYMPH NODE BIOPSY/;  Surgeon: Robert Bellow, MD;  Location: [facility] ORS;  Service: General;  Laterality: Left;  . Sentinel node biopsy Left 12/18/2014    Procedure: SENTINEL NODE  BIOPSY;  Surgeon: Robert Bellow, MD;  Location: [facility] OR[facility];  Service: General;  Laterality: Left;  . Axillary lymph node dissection Left 12/18/2014    Procedure: AXILLARY LYMPH NODE DISSECTION;  Surgeon: Robert Bellow, MD;  Location: [facility] OR[facility];  Service: General;  Laterality: Left;  . Breast biopsy Left 05/2014    CORE - POS  . Abdominal hysterectomy  1994    UTERINE CA    Current Outpatient Rx  Name  Route  Sig  Dispense  Refill  . amLODipine (NORVASC) 2.5 MG tablet      TAKE ONE TABLET BY MOUTH ONCE DAILY   90 tablet   3   . fluticasone (FLONASE) 50 MCG/ACT nasal spray   Each Nare   Place 1 spray into both nostrils as needed for allergies or rhinitis.         Marland Kitchen gabapentin (NEURONTIN) 300 MG capsule      Limit 1 - 2 tabs po / day  or bid if tolerated   120 capsule   2   . letrozole (FEMARA) 2.5 MG tablet   Oral   Take 1 tablet (2.5 mg total) by mouth daily.   90 tablet   3   . lidocaine-prilocaine (EMLA) cream   Topical   Apply 1 application topically as needed. Apply to port then cover with saran wrap 1-2 hours prior to chemotherapy appointment   30 g   1   . loratadine (CLARITIN) 10 MG tablet   Oral   Take 10 mg by mouth daily.         Marland Kitchen  meloxicam (MOBIC) 15 MG tablet      TAKE ONE TABLET BY MOUTH ONCE DAILY   90 tablet   1   . omeprazole (PRILOSEC) 20 MG capsule   Oral   Take 2 capsules (40 mg total) by mouth 2 (two) times daily.   120 capsule   2   . pyridOXINE (VITAMIN B-6) 100 MG tablet   Oral   Take 100 mg by mouth daily.         . sertraline (ZOLOFT) 50 MG tablet      TAKE FOUR TABLETS BY MOUTH AT BEDTIME   120 tablet   5   . traMADol (ULTRAM) 50 MG tablet      Limit 1 tab by mouth 3-6 times per day if tolerated   180 tablet   0   . vitamin B-12 (CYANOCOBALAMIN) 500 MCG tablet   Oral   Take 500 mcg by mouth daily.           Allergies Review of patient's allergies indicates no known allergies.  Family History  Problem  Relation Age of Onset  . Breast cancer Sister   . Colon cancer Mother   . Hypertension Mother   . Asthma Mother   . Cancer Father     Social History Social History  Substance Use Topics  . Smoking status: Former Smoker -- 0.50 packs/day    Types: Cigarettes    Quit date: 07/18/2014  . Smokeless tobacco: Never Used     Comment: given smoking and pain information   . Alcohol Use: No    Review of Systems Constitutional: No fever/chills Eyes: No visual changes. ENT: No sore throat. Cardiovascular: Denies chest pain. Respiratory: Denies shortness of breath. Gastrointestinal: No abdominal pain.  No nausea, no vomiting.  No diarrhea.  No constipation. Genitourinary: Negative for dysuria. Musculoskeletal: Negative for back pain. Skin: Negative for rash. Neurological: Positive for headache, no focal weakness or numbness.  10-point ROS otherwise negative.  ____________________________________________   PHYSICAL EXAM:  VITAL SIGNS: ED Triage Vitals  Enc Vitals Group     BP 09/03/15 1742 148/91 mmHg     Pulse Rate 09/03/15 1742 68     Resp 09/03/15 1742 20     Temp 09/03/15 1742 99 F (37.2 C)     Temp Source 09/03/15 1742 Oral     SpO2 09/03/15 1742 100 %     Weight 09/03/15 1742 176 lb (79.833 kg)     Height 09/03/15 1742 5\' 3"  (1.6 m)     Head Cir --      Peak Flow --      Pain Score 09/03/15 1743 8     Pain Loc --      Pain Edu? --      Excl. in [place]? --     Constitutional: Alert and oriented. Well appearing and in no acute distress but she does appear dizzy when she sits forward/with movement of her head. Eyes: Conjunctivae are normal. PERRL. EOMI. Head: Atraumatic. Nose: No congestion/rhinnorhea. Mouth/Throat: Mucous membranes are moist.  Oropharynx non-erythematous. Neck: No stridor. Supple without meningismus. Cardiovascular: Normal rate, regular rhythm. Grossly normal heart sounds.  Good peripheral circulation. Respiratory: Normal respiratory effort.  No  retractions. Lungs CTAB. Gastrointestinal: Soft and nontender. No distention. No CVA tenderness. Genitourinary: deferred Musculoskeletal: No lower extremity tenderness nor edema.  No joint effusions. Neurologic:  Normal speech and language. No gross focal neurologic deficits are appreciated. No gait instability. 5 out of 5 strength in bilateral  upper and lower extremities, sensation intact to light touch throughout, cranial nerves II through XII intact, normal finger-nose-finger. Skin:  Skin is warm, dry and intact. No rash noted. Psychiatric: Mood and affect are normal. Speech and behavior are normal.  ____________________________________________   LABS (all labs ordered are listed, but only abnormal results are displayed)  Labs Reviewed  BASIC METABOLIC PANEL - Abnormal; Notable for the following:    Potassium 3.2 (*)    Glucose, Bld 110 (*)    All other components within normal limits  CBC - Abnormal; Notable for the following:    Hemoglobin 11.4 (*)    HCT 33.6 (*)    All other components within normal limits  URINALYSIS COMPLETEWITH MICROSCOPIC ([facility] ONLY) - Abnormal; Notable for the following:    Color, Urine YELLOW (*)    APPearance CLEAR (*)    Leukocytes, UA 3+ (*)    Squamous Epithelial / LPF 0-5 (*)    All other components within normal limits   ____________________________________________  EKG  ED ECG REPORT I, Joanne Gavel, the attending physician, personally viewed and interpreted this ECG.   Date: 09/03/2015  EKG Time: 17:54  Rate: 67  Rhythm: normal EKG, normal sinus rhythm  Axis: normal  Intervals:none  ST&T Change: No acute ST segment change.  ____________________________________________  RADIOLOGY  CT head IMPRESSION: 1. No acute intracranial abnormality. 2. Atrophy with chronic small vessel white matter ischemic disease. ____________________________________________   PROCEDURES  Procedure(s) performed: None  Critical Care performed:  No  ____________________________________________   INITIAL IMPRESSION / ASSESSMENT AND PLAN / ED COURSE  Pertinent labs & imaging results that were available during my care of the patient were reviewed by me and considered in my medical decision making (see chart for details).  Carrie Alvarez is a 65 y.o. female with history of hypertension, GERD who presents for evaluation of intermittent room spinning dizziness as well as lightheadedness today. On exam, she is well-appearing and in no acute distress but she does appear to become dizzy when she sits forward. This resolved with lying back. She has a normal finger-nose-finger exam and the remainder of her neurological examination is intact. As her symptoms are reproducible, I suspect they're related to benign positional vertigo. CT head negative for any acute intracranial process. CBC and BMP are generally unremarkable with the exception of mild anemia and mild hypokalemia. Urinalysis with 3+ leukocytes but no bacteria, minimal red and white blood cells. The patient denies any dysuria or increased urinary frequency. We will send culture. At this time, her symptoms have significantly improved after fluids and meclizine. I doubt acute CVA. We discussed return precautions, need for close PCP follow-up as she does have some diastolic hypertension here. Blood pressure the time of discharge is 143/100 and discussed that she needs to follow-up with her primary care doctor so they can adjust her blood pressure medications based on repeat assessments. She voices understanding of this and she is comfortable with the discharge plan. DC home. ____________________________________________   FINAL CLINICAL IMPRESSION(S) / ED DIAGNOSES  Final diagnoses:  Dizziness  Essential hypertension      Joanne Gavel, MD 09/04/15 [phone]

## 2015-09-03 NOTE — ED Notes (Signed)
Patient transported to CT 

## 2015-09-04 ENCOUNTER — Encounter: Payer: Self-pay | Admitting: Family Medicine

## 2015-09-04 ENCOUNTER — Ambulatory Visit (INDEPENDENT_AMBULATORY_CARE_PROVIDER_SITE_OTHER): Payer: Medicare Other | Admitting: Family Medicine

## 2015-09-04 VITALS — BP 122/82 | HR 72 | Temp 98.6°F | Resp 16 | Wt 182.0 lb

## 2015-09-04 DIAGNOSIS — I10 Essential (primary) hypertension: Secondary | ICD-10-CM

## 2015-09-04 DIAGNOSIS — J309 Allergic rhinitis, unspecified: Secondary | ICD-10-CM | POA: Insufficient documentation

## 2015-09-04 DIAGNOSIS — C55 Malignant neoplasm of uterus, part unspecified: Secondary | ICD-10-CM | POA: Insufficient documentation

## 2015-09-04 DIAGNOSIS — H8110 Benign paroxysmal vertigo, unspecified ear: Secondary | ICD-10-CM | POA: Diagnosis not present

## 2015-09-04 DIAGNOSIS — L639 Alopecia areata, unspecified: Secondary | ICD-10-CM | POA: Insufficient documentation

## 2015-09-04 DIAGNOSIS — M1990 Unspecified osteoarthritis, unspecified site: Secondary | ICD-10-CM | POA: Insufficient documentation

## 2015-09-04 DIAGNOSIS — E559 Vitamin D deficiency, unspecified: Secondary | ICD-10-CM | POA: Insufficient documentation

## 2015-09-04 HISTORY — DX: Benign paroxysmal vertigo, unspecified ear: H81.10

## 2015-09-04 MED ORDER — MECLIZINE HCL 12.5 MG PO TABS
12.5000 mg | ORAL_TABLET | Freq: Four times a day (QID) | ORAL | Status: DC | PRN
Start: 2015-09-04 — End: 2016-04-18

## 2015-09-04 NOTE — Patient Instructions (Signed)
Benign Positional Vertigo Vertigo is the feeling that you or your surroundings are moving when they are not. Benign positional vertigo is the most common form of vertigo. The cause of this condition is not serious (is benign). This condition is triggered by certain movements and positions (is positional). This condition can be dangerous if it occurs while you are doing something that could endanger you or others, such as driving.  CAUSES In many cases, the cause of this condition is not known. It may be caused by a disturbance in an area of the inner ear that helps your brain to sense movement and balance. This disturbance can be caused by a viral infection (labyrinthitis), head injury, or repetitive motion. RISK FACTORS This condition is more likely to develop in:  Women.  People who are 50 years of age or older. SYMPTOMS Symptoms of this condition usually happen when you move your head or your eyes in different directions. Symptoms may start suddenly, and they usually last for less than a minute. Symptoms may include:  Loss of balance and falling.  Feeling like you are spinning or moving.  Feeling like your surroundings are spinning or moving.  Nausea and vomiting.  Blurred vision.  Dizziness.  Involuntary eye movement (nystagmus). Symptoms can be mild and cause only slight annoyance, or they can be severe and interfere with daily life. Episodes of benign positional vertigo may return (recur) over time, and they may be triggered by certain movements. Symptoms may improve over time. DIAGNOSIS This condition is usually diagnosed by medical history and a physical exam of the head, neck, and ears. You may be referred to a health care provider who specializes in ear, nose, and throat (ENT) problems (otolaryngologist) or a provider who specializes in disorders of the nervous system (neurologist). You may have additional testing, including:  MRI.  A CT scan.  Eye movement tests. Your  health care provider may ask you to change positions quickly while he or she watches you for symptoms of benign positional vertigo, such as nystagmus. Eye movement may be tested with an electronystagmogram (ENG), caloric stimulation, the Dix-Hallpike test, or the roll test.  An electroencephalogram (EEG). This records electrical activity in your brain.  Hearing tests. TREATMENT Usually, your health care provider will treat this by moving your head in specific positions to adjust your inner ear back to normal. Surgery may be needed in severe cases, but this is rare. In some cases, benign positional vertigo may resolve on its own in 2-4 weeks. HOME CARE INSTRUCTIONS Safety  Move slowly.Avoid sudden body or head movements.  Avoid driving.  Avoid operating heavy machinery.  Avoid doing any tasks that would be dangerous to you or others if a vertigo episode would occur.  If you have trouble walking or keeping your balance, try using a cane for stability. If you feel dizzy or unstable, sit down right away.  Return to your normal activities as told by your health care provider. Ask your health care provider what activities are safe for you. General Instructions  Take over-the-counter and prescription medicines only as told by your health care provider.  Avoid certain positions or movements as told by your health care provider.  Drink enough fluid to keep your urine clear or pale yellow.  Keep all follow-up visits as told by your health care provider. This is important. SEEK MEDICAL CARE IF:  You have a fever.  Your condition gets worse or you develop new symptoms.  Your family or friends   notice any behavioral changes.  Your nausea or vomiting gets worse.  You have numbness or a "pins and needles" sensation. SEEK IMMEDIATE MEDICAL CARE IF:  You have difficulty speaking or moving.  You are always dizzy.  You faint.  You develop severe headaches.  You have weakness in your  legs or arms.  You have changes in your hearing or vision.  You develop a stiff neck.  You develop sensitivity to light.   This information is not intended to replace advice given to you by your health care provider. Make sure you discuss any questions you have with your health care provider.   Document Released: 04/06/2006 Document Revised: 03/20/2015 Document Reviewed: 10/22/2014 Elsevier Interactive Patient Education 2016 [employer]  

## 2015-09-04 NOTE — Progress Notes (Signed)
Subjective:    Patient ID: Carrie Alvarez, female    DOB: [birth year], 65 y.o.   MRN: [id number]  HPI   Follow up ER visit  Patient was seen in ER for dizziness on 09/03/2015. She was treated for dizziness and HTN. Treatment for this included meclizine 12.5 mg. She reports poor compliance with treatment. (Pt did not get rx filled due to getting out of the ED late.) She reports this condition is slightly improved. BP has greatly improved. Is 122/82 in office today.Marland Kitchen  ------------------------------------------------------------------------------------    Review of Systems  Constitutional: Negative for fever, chills, diaphoresis, activity change, appetite change, fatigue and unexpected weight change.  Respiratory: Negative for cough, shortness of breath and wheezing.   Cardiovascular: Negative for chest pain, palpitations and leg swelling.  Neurological: Positive for dizziness.   BP 122/82 mmHg  Pulse 72  Temp(Src) 98.6 F (37 C) (Oral)  Resp 16  Wt 182 lb (82.555 kg)  SpO2 99%   Patient Active Problem List   Diagnosis Date Noted  . Allergic rhinitis 09/04/2015  . AA (alopecia areata) 09/04/2015  . Arthritis, degenerative 09/04/2015  . Cancer of uterus ([place]) 09/04/2015  . Avitaminosis D 09/04/2015  . BP (high blood pressure) 03/15/2015  . DDD (degenerative disc disease), lumbar 03/07/2015  . Dizziness 12/25/2014  . Lumbosacral facet joint syndrome 12/10/2014  . Sacroiliac joint dysfunction 12/10/2014  . DDD (degenerative disc disease), lumbosacral 11/13/2014  . Clinical depression 10/13/2014  . Breast cancer metastasized to axillary lymph node ([place]) 06/21/2014  . Hypercholesteremia 08/30/2009  . Compulsive tobacco user syndrome 04/21/2008  . Adaptation reaction 07/18/2007   Past Medical History  Diagnosis Date  . Hypertension   . Back pain   . Obesity   . Allergy   . Carpal tunnel syndrome   . Depression   . GERD (gastroesophageal reflux disease)   .  Anemia   . Uterine cancer ([place]) 1994  . Breast cancer ([place]) 2015    LT LUMPECTOMY 12-2014 FOLLOWING CHEMO   Current Outpatient Prescriptions on File Prior to Visit  Medication Sig  . amLODipine (NORVASC) 2.5 MG tablet TAKE ONE TABLET BY MOUTH ONCE DAILY  . fluticasone (FLONASE) 50 MCG/ACT nasal spray Place 1 spray into both nostrils as needed for allergies or rhinitis.  Marland Kitchen letrozole (FEMARA) 2.5 MG tablet Take 1 tablet (2.5 mg total) by mouth daily.  Marland Kitchen lidocaine-prilocaine (EMLA) cream Apply 1 application topically as needed. Apply to port then cover with saran wrap 1-2 hours prior to chemotherapy appointment  . loratadine (CLARITIN) 10 MG tablet Take 10 mg by mouth daily.  . meloxicam (MOBIC) 15 MG tablet TAKE ONE TABLET BY MOUTH ONCE DAILY  . omeprazole (PRILOSEC) 20 MG capsule Take 2 capsules (40 mg total) by mouth 2 (two) times daily.  Marland Kitchen pyridOXINE (VITAMIN B-6) 100 MG tablet Take 100 mg by mouth daily.  . sertraline (ZOLOFT) 50 MG tablet TAKE FOUR TABLETS BY MOUTH AT BEDTIME  . vitamin B-12 (CYANOCOBALAMIN) 500 MCG tablet Take 500 mcg by mouth daily.  Marland Kitchen gabapentin (NEURONTIN) 300 MG capsule Limit 1 - 2 tabs po / day  or bid if tolerated (Patient not taking: Reported on 09/04/2015)  . meclizine (ANTIVERT) 12.5 MG tablet Take 1 tablet (12.5 mg total) by mouth every 8 (eight) hours as needed for dizziness or nausea. (Patient not taking: Reported on 09/04/2015)  . traMADol (ULTRAM) 50 MG tablet Limit 1 tab by mouth 3-6 times per day if tolerated (Patient not taking: Reported  on 09/04/2015)   Current Facility-Administered Medications on File Prior to Visit  Medication  . heparin lock flush 100 unit/mL  . sodium chloride 0.9 % injection 10 mL   No Known Allergies Past Surgical History  Procedure Laterality Date  . Breast surgery    . Axillary lymph node biopsy Left 12/18/2014    Procedure: AXILLARY LYMPH NODE BIOPSY/;  Surgeon: Robert Bellow, MD;  Location: [facility] ORS;  Service: General;   Laterality: Left;  . Sentinel node biopsy Left 12/18/2014    Procedure: SENTINEL NODE BIOPSY;  Surgeon: Robert Bellow, MD;  Location: [facility] ORS;  Service: General;  Laterality: Left;  . Axillary lymph node dissection Left 12/18/2014    Procedure: AXILLARY LYMPH NODE DISSECTION;  Surgeon: Robert Bellow, MD;  Location: [facility] ORS;  Service: General;  Laterality: Left;  . Breast biopsy Left 05/2014    CORE - POS  . Abdominal hysterectomy  1994    UTERINE CA   Social History   Social History  . Marital Status: Married    Spouse Name: N/A  . Number of Children: N/A  . Years of Education: N/A   Occupational History  . Not on file.   Social History Main Topics  . Smoking status: Former Smoker -- 0.50 packs/day    Types: Cigarettes    Quit date: 07/18/2014  . Smokeless tobacco: Never Used     Comment: given smoking and pain information   . Alcohol Use: No  . Drug Use: No  . Sexual Activity: Not on file   Other Topics Concern  . Not on file   Social History Narrative   Family History  Problem Relation Age of Onset  . Breast cancer Sister   . Colon cancer Mother   . Hypertension Mother   . Asthma Mother   . Cancer Father        Objective:   Physical Exam  Constitutional: She is oriented to person, place, and time. She appears well-developed and well-nourished.  HENT:  Head: Normocephalic and atraumatic.  Right Ear: External ear normal.  Left Ear: External ear normal.  Mouth/Throat: Oropharynx is clear and moist.  Eyes: Conjunctivae and EOM are normal. Pupils are equal, round, and reactive to light.  Neck: Normal range of motion. Neck supple.  Cardiovascular: Normal rate and regular rhythm.   Pulmonary/Chest: Effort normal and breath sounds normal.  Neurological: She is alert and oriented to person, place, and time. She displays normal reflexes. No cranial nerve deficit. Coordination normal.  Could reproduce extreme vertigo with head maneuvers.    Psychiatric: She  has a normal mood and affect. Her behavior is normal. Judgment and thought content normal.   BP 122/82 mmHg  Pulse 72  Temp(Src) 98.6 F (37 C) (Oral)  Resp 16  Wt 182 lb (82.555 kg)  SpO2 99%        Assessment & Plan:  1. Benign positional vertigo, unspecified laterality New problem. Worsening. Will take Meclizine until symptoms improve. Gave handout with labyrinth exercises to be done 3 times daily. ENT referral if does not improve.   - meclizine (ANTIVERT) 12.5 MG tablet; Take 1 tablet (12.5 mg total) by mouth every 6 (six) hours as needed for dizziness or nausea.  Dispense: 60 tablet; Refill: 0    2. Essential hypertension Suspect related to stress related to neurologic symptoms. Monitor. Patient instructed to call back if condition worsens or does not improve.     Patient was seen and examined by Izora Gala  Linde Gillis, MD, and note scribed by Renaldo Fiddler, CMA. I have reviewed the document for accuracy and completeness and I agree with above. Jerrell Belfast, MD   Margarita Rana, MD

## 2015-09-05 LAB — URINE CULTURE

## 2015-09-12 ENCOUNTER — Ambulatory Visit: Payer: Medicare Other | Attending: Pain Medicine | Admitting: Pain Medicine

## 2015-09-12 ENCOUNTER — Encounter: Payer: Self-pay | Admitting: Pain Medicine

## 2015-09-12 VITALS — BP 100/70 | HR 80 | Temp 98.2°F | Resp 16 | Ht 63.0 in | Wt 180.0 lb

## 2015-09-12 DIAGNOSIS — Z9889 Other specified postprocedural states: Secondary | ICD-10-CM | POA: Diagnosis not present

## 2015-09-12 DIAGNOSIS — M791 Myalgia: Secondary | ICD-10-CM | POA: Diagnosis not present

## 2015-09-12 DIAGNOSIS — M79606 Pain in leg, unspecified: Secondary | ICD-10-CM | POA: Diagnosis present

## 2015-09-12 DIAGNOSIS — Z853 Personal history of malignant neoplasm of breast: Secondary | ICD-10-CM | POA: Diagnosis not present

## 2015-09-12 DIAGNOSIS — M533 Sacrococcygeal disorders, not elsewhere classified: Secondary | ICD-10-CM | POA: Diagnosis not present

## 2015-09-12 DIAGNOSIS — M5137 Other intervertebral disc degeneration, lumbosacral region: Secondary | ICD-10-CM

## 2015-09-12 DIAGNOSIS — M5416 Radiculopathy, lumbar region: Secondary | ICD-10-CM | POA: Diagnosis not present

## 2015-09-12 DIAGNOSIS — M5136 Other intervertebral disc degeneration, lumbar region: Secondary | ICD-10-CM | POA: Diagnosis not present

## 2015-09-12 DIAGNOSIS — M47817 Spondylosis without myelopathy or radiculopathy, lumbosacral region: Secondary | ICD-10-CM

## 2015-09-12 DIAGNOSIS — M4806 Spinal stenosis, lumbar region: Secondary | ICD-10-CM | POA: Diagnosis not present

## 2015-09-12 DIAGNOSIS — M47816 Spondylosis without myelopathy or radiculopathy, lumbar region: Secondary | ICD-10-CM | POA: Insufficient documentation

## 2015-09-12 DIAGNOSIS — M461 Sacroiliitis, not elsewhere classified: Secondary | ICD-10-CM | POA: Diagnosis not present

## 2015-09-12 DIAGNOSIS — M545 Low back pain: Secondary | ICD-10-CM | POA: Diagnosis present

## 2015-09-12 NOTE — Patient Instructions (Addendum)
Continue present medications  Prior medications have included Neurontin and Ultram. We will consider modifying medications pending follow-up evaluations  Lumbar facet, medial branch nerve, blocks to be performed at time return appointment  F/U PCP Dr. Venia Minks for evaliation of BP and general medical condition  F/U surgical evaluation. Patient without plans for surgery at this time  F/U neurological evaluation. May consider [facility] EMG studies and other studies  F/U oncological evaluation. Patient will follow-up with Dr. Grayland Ormond as planned  May consider radiofrequency rhizolysis or intraspinal procedures pending response to present treatment and F/U evaluation   Patient to call [facility] should patient have concerns prior to scheduled return appointmentPain Management Discharge Instructions  General Discharge Instructions :  If you need to reach your doctor call: Monday-Friday 8:00 am - 4:00 pm at [phone] or toll free [phone].  After clinic hours [phone] to have operator reach doctor.  Bring all of your medication bottles to all your appointments in the pain clinic.  To cancel or reschedule your appointment with Pain Management please remember to call 24 hours in advance to avoid a fee.  Refer to the educational materials which you have been given on: General Risks, I had my Procedure. Discharge Instructions, Post Sedation.  Post Procedure Instructions:  The drugs you were given will stay in your system until tomorrow, so for the next 24 hours you should not drive, make any legal decisions or drink any alcoholic beverages.  You may eat anything you prefer, but it is better to start with liquids then soups and crackers, and gradually work up to solid foods.  Please notify your doctor immediately if you have any unusual bleeding, trouble breathing or pain that is not related to your normal pain.  Depending on the type of procedure that was done, some parts  of your body may feel week and/or numb.  This usually clears up by tonight or the next day.  Walk with the use of an assistive device or accompanied by an adult for the 24 hours.  You may use ice on the affected area for the first 24 hours.  Put ice in a Ziploc bag and cover with a towel and place against area 15 minutes on 15 minutes off.  You may switch to heat after 24 hours.GENERAL RISKS AND COMPLICATIONS  What are the risk, side effects and possible complications? Generally speaking, most procedures are safe.  However, with any procedure there are risks, side effects, and the possibility of complications.  The risks and complications are dependent upon the sites that are lesioned, or the type of nerve block to be performed.  The closer the procedure is to the spine, the more serious the risks are.  Great care is taken when placing the radio frequency needles, block needles or lesioning probes, but sometimes complications can occur. 1. Infection: Any time there is an injection through the skin, there is a risk of infection.  This is why sterile conditions are used for these blocks.  There are four possible types of infection. 1. Localized skin infection. 2. Central Nervous System Infection-This can be in the form of Meningitis, which can be deadly. 3. Epidural Infections-This can be in the form of an epidural abscess, which can cause pressure inside of the spine, causing compression of the spinal cord with subsequent paralysis. This would require an emergency surgery to decompress, and there are no guarantees that the patient would recover from the paralysis. 4. Discitis-This is an infection of the intervertebral  discs.  It occurs in about 1% of discography procedures.  It is difficult to treat and it may lead to surgery.        2. Pain: the needles have to go through skin and soft tissues, will cause soreness.       3. Damage to internal structures:  The nerves to be lesioned may be near blood  vessels or    other nerves which can be potentially damaged.       4. Bleeding: Bleeding is more common if the patient is taking blood thinners such as  aspirin, Coumadin, Ticiid, Plavix, etc., or if he/she have some genetic predisposition  such as hemophilia. Bleeding into the spinal canal can cause compression of the spinal  cord with subsequent paralysis.  This would require an emergency surgery to  decompress and there are no guarantees that the patient would recover from the  paralysis.       5. Pneumothorax:  Puncturing of a lung is a possibility, every time a needle is introduced in  the area of the chest or upper back.  Pneumothorax refers to free air around the  collapsed lung(s), inside of the thoracic cavity (chest cavity).  Another two possible  complications related to a similar event would include: Hemothorax and Chylothorax.   These are variations of the Pneumothorax, where instead of air around the collapsed  lung(s), you may have blood or chyle, respectively.       6. Spinal headaches: They may occur with any procedures in the area of the spine.       7. Persistent CSF (Cerebro-Spinal Fluid) leakage: This is a rare problem, but may occur  with prolonged intrathecal or epidural catheters either due to the formation of a fistulous  track or a dural tear.       8. Nerve damage: By working so close to the spinal cord, there is always a possibility of  nerve damage, which could be as serious as a permanent spinal cord injury with  paralysis.       9. Death:  Although rare, severe deadly allergic reactions known as "Anaphylactic  reaction" can occur to any of the medications used.      10. Worsening of the symptoms:  We can always make thing worse.  What are the chances of something like this happening? Chances of any of this occuring are extremely low.  By statistics, you have more of a chance of getting killed in a motor vehicle accident: while driving to the hospital than any of the above  occurring .  Nevertheless, you should be aware that they are possibilities.  In general, it is similar to taking a shower.  Everybody knows that you can slip, hit your head and get killed.  Does that mean that you should not shower again?  Nevertheless always keep in mind that statistics do not mean anything if you happen to be on the wrong side of them.  Even if a procedure has a 1 (one) in a 1,000,000 (million) chance of going wrong, it you happen to be that one..Also, keep in mind that by statistics, you have more of a chance of having something go wrong when taking medications.  Who should not have this procedure? If you are on a blood thinning medication (e.g. Coumadin, Plavix, see list of "Blood Thinners"), or if you have an active infection going on, you should not have the procedure.  If you are taking any blood thinners, please inform  your physician.  How should I prepare for this procedure?  Do not eat or drink anything at least six hours prior to the procedure.  Bring a driver with you .  It cannot be a taxi.  Come accompanied by an adult that can drive you back, and that is strong enough to help you if your legs get weak or numb from the local anesthetic.  Take all of your medicines the morning of the procedure with just enough water to swallow them.  If you have diabetes, make sure that you are scheduled to have your procedure done first thing in the morning, whenever possible.  If you have diabetes, take only half of your insulin dose and notify our nurse that you have done so as soon as you arrive at the clinic.  If you are diabetic, but only take blood sugar pills (oral hypoglycemic), then do not take them on the morning of your procedure.  You may take them after you have had the procedure.  Do not take aspirin or any aspirin-containing medications, at least eleven (11) days prior to the procedure.  They may prolong bleeding.  Wear loose fitting clothing that may be easy to  take off and that you would not mind if it got stained with Betadine or blood.  Do not wear any jewelry or perfume  Remove any nail coloring.  It will interfere with some of our monitoring equipment.  NOTE: Remember that this is not meant to be interpreted as a complete list of all possible complications.  Unforeseen problems may occur.  BLOOD THINNERS The following drugs contain aspirin or other products, which can cause increased bleeding during surgery and should not be taken for 2 weeks prior to and 1 week after surgery.  If you should need take something for relief of minor pain, you may take acetaminophen which is found in Tylenol,m Datril, Anacin-3 and Panadol. It is not blood thinner. The products listed below are.  Do not take any of the products listed below in addition to any listed on your instruction sheet.  A.P.C or A.P.C with Codeine Codeine Phosphate Capsules #3 Ibuprofen Ridaura  ABC compound Congesprin Imuran rimadil  Advil Cope Indocin Robaxisal  Alka-Seltzer Effervescent Pain Reliever and Antacid Coricidin or Coricidin-D  Indomethacin Rufen  Alka-Seltzer plus Cold Medicine Cosprin Ketoprofen S-A-C Tablets  Anacin Analgesic Tablets or Capsules Coumadin Korlgesic Salflex  Anacin Extra Strength Analgesic tablets or capsules CP-2 Tablets Lanoril Salicylate  Anaprox Cuprimine Capsules Levenox Salocol  Anexsia-D Dalteparin Magan Salsalate  Anodynos Darvon compound Magnesium Salicylate Sine-off  Ansaid Dasin Capsules Magsal Sodium Salicylate  Anturane Depen Capsules Marnal Soma  APF Arthritis pain formula Dewitt's Pills Measurin Stanback  Argesic Dia-Gesic Meclofenamic Sulfinpyrazone  Arthritis Bayer Timed Release Aspirin Diclofenac Meclomen Sulindac  Arthritis pain formula Anacin Dicumarol Medipren Supac  Analgesic (Safety coated) Arthralgen Diffunasal Mefanamic Suprofen  Arthritis Strength Bufferin Dihydrocodeine Mepro Compound Suprol  Arthropan liquid Dopirydamole  Methcarbomol with Aspirin Synalgos  ASA tablets/Enseals Disalcid Micrainin Tagament  Ascriptin Doan's Midol Talwin  Ascriptin A/D Dolene Mobidin Tanderil  Ascriptin Extra Strength Dolobid Moblgesic Ticlid  Ascriptin with Codeine Doloprin or Doloprin with Codeine Momentum Tolectin  Asperbuf Duoprin Mono-gesic Trendar  Aspergum Duradyne Motrin or Motrin IB Triminicin  Aspirin plain, buffered or enteric coated Durasal Myochrisine Trigesic  Aspirin Suppositories Easprin Nalfon Trillsate  Aspirin with Codeine Ecotrin Regular or Extra Strength Naprosyn Uracel  Atromid-S Efficin Naproxen Ursinus  Auranofin Capsules Elmiron Neocylate Vanquish  Axotal Emagrin Norgesic  Verin  Azathioprine Empirin or Empirin with Codeine Normiflo Vitamin E  Azolid Emprazil Nuprin Voltaren  Bayer Aspirin plain, buffered or children's or timed BC Tablets or powders Encaprin Orgaran Warfarin Sodium  Buff-a-Comp Enoxaparin Orudis Zorpin  Buff-a-Comp with Codeine Equegesic Os-Cal-Gesic   Buffaprin Excedrin plain, buffered or Extra Strength Oxalid   Bufferin Arthritis Strength Feldene Oxphenbutazone   Bufferin plain or Extra Strength Feldene Capsules Oxycodone with Aspirin   Bufferin with Codeine Fenoprofen Fenoprofen Pabalate or Pabalate-SF   Buffets II Flogesic Panagesic   Buffinol plain or Extra Strength Florinal or Florinal with Codeine Panwarfarin   Buf-Tabs Flurbiprofen Penicillamine   Butalbital Compound Four-way cold tablets Penicillin   Butazolidin Fragmin Pepto-Bismol   Carbenicillin Geminisyn Percodan   Carna Arthritis Reliever Geopen Persantine   Carprofen Gold's salt Persistin   Chloramphenicol Goody's Phenylbutazone   Chloromycetin Haltrain Piroxlcam   Clmetidine heparin Plaquenil   Cllnoril Hyco-pap Ponstel   Clofibrate Hydroxy chloroquine Propoxyphen         Before stopping any of these medications, be sure to consult the physician who ordered them.  Some, such as Coumadin (Warfarin) are ordered  to prevent or treat serious conditions such as "deep thrombosis", "pumonary embolisms", and other heart problems.  The amount of time that you may need off of the medication may also vary with the medication and the reason for which you were taking it.  If you are taking any of these medications, please make sure you notify your pain physician before you undergo any procedures.         Facet Joint Block The facet joints connect the bones of the spine (vertebrae). They make it possible for you to bend, twist, and make other movements with your spine. They also prevent you from overbending, overtwisting, and making other excessive movements.  A facet joint block is a procedure where a numbing medicine (anesthetic) is injected into a facet joint. Often, a type of anti-inflammatory medicine called a steroid is also injected. A facet joint block may be done for two reasons:  2. Diagnosis. A facet joint block may be done as a test to see whether neck or back pain is caused by a worn-down or infected facet joint. If the pain gets better after a facet joint block, it means the pain is probably coming from the facet joint. If the pain does not get better, it means the pain is probably not coming from the facet joint.  3. Therapy. A facet joint block may be done to relieve neck or back pain caused by a facet joint. A facet joint block is only done as a therapy if the pain does not improve with medicine, exercise programs, physical therapy, and other forms of pain management. LET [facility] CARE PROVIDER KNOW ABOUT:   Any allergies you have.   All medicines you are taking, including vitamins, herbs, eyedrops, and over-the-counter medicines and creams.   Previous problems you or members of your family have had with the use of anesthetics.   Any blood disorders you have had.   Other health problems you have. RISKS AND COMPLICATIONS Generally, having a facet joint block is safe. However, as with  any procedure, complications can occur. Possible complications associated with having a facet joint block include:   Bleeding.   Injury to a nerve near the injection site.   Pain at the injection site.   Weakness or numbness in areas controlled by nerves near the injection site.   Infection.  Temporary fluid retention.   Allergic reaction to anesthetics or medicines used during the procedure. BEFORE THE PROCEDURE   Follow your health care provider's instructions if you are taking dietary supplements or medicines. You may need to stop taking them or reduce your dosage.   Do not take any new dietary supplements or medicines without asking your health care provider first.   Follow your health care provider's instructions about eating and drinking before the procedure. You may need to stop eating and drinking several hours before the procedure.   Arrange to have an adult drive you home after the procedure. PROCEDURE 12. You may need to remove your clothing and dress in an open-back gown so that your health care provider can access your spine.  13. The procedure will be done while you are lying on an X-ray table. Most of the time you will be asked to lie on your stomach, but you may be asked to lie in a different position if an injection will be made in your neck.  14. Special machines will be used to monitor your oxygen levels, heart rate, and blood pressure.  15. If an injection will be made in your neck, an intravenous (IV) tube will be inserted into one of your veins. Fluids and medicine will flow directly into your body through the IV tube.  16. The area over the facet joint where the injection will be made will be cleaned with an antiseptic soap. The surrounding skin will be covered with sterile drapes.  17. An anesthetic will be applied to your skin to make the injection area numb. You may feel a temporary stinging or burning sensation.  18. A video X-ray machine will  be used to locate the joint. A contrast dye may be injected into the facet joint area to help with locating the joint.  19. When the joint is located, an anesthetic medicine will be injected into the joint through the needle.  17. Your health care provider will ask you whether you feel pain relief. If you do feel relief, a steroid may be injected to provide pain relief for a longer period of time. If you do not feel relief or feel only partial relief, additional injections of an anesthetic may be made in other facet joints.  21. The needle will be removed, the skin will be cleansed, and bandages will be applied.  AFTER THE PROCEDURE   You will be observed for 15-30 minutes before being allowed to go home. Do not drive. Have an adult drive you or take a taxi or public transportation instead.   If you feel pain relief, the pain will return in several hours or days when the anesthetic wears off.   You may feel pain relief 2-14 days after the procedure. The amount of time this relief lasts varies from person to person.   It is normal to feel some tenderness over the injected area(s) for 2 days following the procedure.   If you have diabetes, you may have a temporary increase in blood sugar.   This information is not intended to replace advice given to you by your health care provider. Make sure you discuss any questions you have with your health care provider.   Document Released: 11/18/2006 Document Revised: 07/20/2014 Document Reviewed: 04/18/2012 Elsevier Interactive Patient Education [employer].

## 2015-09-12 NOTE — Progress Notes (Signed)
   Subjective:    Patient ID: Norm Salt, female    DOB: 11/24/1950, 65 y.o.   MRN: [id number]  HPI The patient is a 65 year old female who returns to pain management for further evaluation and treatment of lower back and lower extremity pain. The patient is undergone surgery for carcinoma of breast. At the present time patient is in hopes of being able to undergo interventional treatment of pain involving the lumbar and lower extremity region. We discussed patient's condition. Patient is without trauma or change in events of daily living the call significant change in symptomatology. Patient states pain radiating from buttocks to the lower and in the left as well as on the right is pain become more intense as the day progresses. We informed patient that we will proceed with lumbar facet, medial branch nerve, blocks at time return appointment and that we will consider additional modifications of treatment pending follow-up evaluation. All agreed to suggested treatment plan   Review of Systems     Objective:   Physical Exam  There was tenderness over the splenius capitis and occipitalis musculature regions palpation which reproduces moderate discomfort. There was moderate tenderness of the acromioclavicular and glenohumeral joint region. The patient appeared to be with bilaterally equal grip strength and Tinel and Phalen's maneuver were with minimal increased pain. Palpation over the PSIS and PII S region reproduced pain of moderate degree. Palpation over the lower lumbar paraspinal must reason and the thoracic paraspinal musculature region was attends to palpation of moderate severe degree palpation of the lumbar facets reproduced predominant portion of patient's pain. EHL strength was slightly decreased. Palpation of the PSIS and PII S region reproduced moderately severe discomfort. Straight leg raise was tolerates approximately 30 without increased pain with dorsiflexion noted. The knees  were tenderness to palpation with crepitus of the knees with negative anterior and posterior drawer signs. No sensory deficit or dermatomal dystrophy detected. There was negative clonus negative Homans. Abdomen was nontender with no costovertebral tenderness noted      Assessment & Plan:  Degenerative disc disease lumbar spine. Multilevel degenerative changes lumbar spine.  Lumbar facet syndrome  Lumbar stenosis  Sacroiliac joint dysfunction         PLAN  Continue present medications  Prior medications have included Neurontin and Ultram. We will consider modifying medications pending follow-up evaluations  Lumbar facet, medial branch nerve, blocks to be performed at time return appointment  F/U PCP Dr. Venia Minks for evaliation of BP and general medical condition  F/U surgical evaluation. Patient without plans for surgery at this time  F/U neurological evaluation. May consider PNCV EMG studies and other studies  F/U oncological evaluation. Patient will follow-up with Dr. Grayland Ormond as planned  May consider radiofrequency rhizolysis or intraspinal procedures pending response to present treatment and F/U evaluation   Patient to call Pain Management Center should patient have concerns prior to scheduled return appointment

## 2015-09-12 NOTE — Progress Notes (Signed)
Safety precautions to be maintained throughout the outpatient stay will include: orient to surroundings, keep bed in low position, maintain call bell within reach at all times, provide assistance with transfer out of bed and ambulation.  

## 2015-09-16 DIAGNOSIS — H25813 Combined forms of age-related cataract, bilateral: Secondary | ICD-10-CM | POA: Diagnosis not present

## 2015-09-23 ENCOUNTER — Inpatient Hospital Stay: Payer: Medicare Other | Attending: Oncology

## 2015-09-23 ENCOUNTER — Inpatient Hospital Stay (HOSPITAL_BASED_OUTPATIENT_CLINIC_OR_DEPARTMENT_OTHER): Payer: Medicare Other | Admitting: Oncology

## 2015-09-23 VITALS — BP 129/89 | HR 78 | Temp 97.1°F | Resp 16 | Wt 181.0 lb

## 2015-09-23 DIAGNOSIS — K219 Gastro-esophageal reflux disease without esophagitis: Secondary | ICD-10-CM | POA: Diagnosis not present

## 2015-09-23 DIAGNOSIS — C773 Secondary and unspecified malignant neoplasm of axilla and upper limb lymph nodes: Secondary | ICD-10-CM | POA: Insufficient documentation

## 2015-09-23 DIAGNOSIS — D649 Anemia, unspecified: Secondary | ICD-10-CM | POA: Insufficient documentation

## 2015-09-23 DIAGNOSIS — C50912 Malignant neoplasm of unspecified site of left female breast: Secondary | ICD-10-CM

## 2015-09-23 DIAGNOSIS — Z452 Encounter for adjustment and management of vascular access device: Secondary | ICD-10-CM | POA: Diagnosis not present

## 2015-09-23 DIAGNOSIS — I10 Essential (primary) hypertension: Secondary | ICD-10-CM | POA: Diagnosis not present

## 2015-09-23 DIAGNOSIS — Z9221 Personal history of antineoplastic chemotherapy: Secondary | ICD-10-CM

## 2015-09-23 DIAGNOSIS — Z79899 Other long term (current) drug therapy: Secondary | ICD-10-CM | POA: Diagnosis not present

## 2015-09-23 DIAGNOSIS — T451X5S Adverse effect of antineoplastic and immunosuppressive drugs, sequela: Secondary | ICD-10-CM | POA: Diagnosis not present

## 2015-09-23 DIAGNOSIS — R51 Headache: Secondary | ICD-10-CM

## 2015-09-23 DIAGNOSIS — N644 Mastodynia: Secondary | ICD-10-CM | POA: Diagnosis not present

## 2015-09-23 DIAGNOSIS — G620 Drug-induced polyneuropathy: Secondary | ICD-10-CM | POA: Diagnosis not present

## 2015-09-23 DIAGNOSIS — Z79811 Long term (current) use of aromatase inhibitors: Secondary | ICD-10-CM | POA: Diagnosis not present

## 2015-09-23 DIAGNOSIS — Z170 Estrogen receptor positive status [ER+]: Secondary | ICD-10-CM | POA: Insufficient documentation

## 2015-09-23 DIAGNOSIS — C50919 Malignant neoplasm of unspecified site of unspecified female breast: Secondary | ICD-10-CM

## 2015-09-23 DIAGNOSIS — Z87891 Personal history of nicotine dependence: Secondary | ICD-10-CM | POA: Insufficient documentation

## 2015-09-23 NOTE — Progress Notes (Signed)
Patient occasional has a pain in left breast that was evaluated at ER with a normal work up in February.

## 2015-09-24 ENCOUNTER — Encounter: Payer: Self-pay | Admitting: Family Medicine

## 2015-09-24 ENCOUNTER — Ambulatory Visit (INDEPENDENT_AMBULATORY_CARE_PROVIDER_SITE_OTHER): Payer: Medicare Other | Admitting: Family Medicine

## 2015-09-24 VITALS — BP 110/88 | HR 78 | Temp 98.4°F | Resp 20 | Ht 63.5 in | Wt 180.0 lb

## 2015-09-24 DIAGNOSIS — I10 Essential (primary) hypertension: Secondary | ICD-10-CM

## 2015-09-24 DIAGNOSIS — C50919 Malignant neoplasm of unspecified site of unspecified female breast: Secondary | ICD-10-CM | POA: Diagnosis not present

## 2015-09-24 DIAGNOSIS — Z79899 Other long term (current) drug therapy: Secondary | ICD-10-CM

## 2015-09-24 DIAGNOSIS — C773 Secondary and unspecified malignant neoplasm of axilla and upper limb lymph nodes: Secondary | ICD-10-CM | POA: Diagnosis not present

## 2015-09-24 DIAGNOSIS — Z0000 Encounter for general adult medical examination without abnormal findings: Secondary | ICD-10-CM

## 2015-09-24 DIAGNOSIS — E7800 Pure hypercholesterolemia, unspecified: Secondary | ICD-10-CM | POA: Diagnosis not present

## 2015-09-24 LAB — CANCER ANTIGEN 27.29: CA 27.29: 17.6 U/mL (ref 0.0–38.6)

## 2015-09-24 NOTE — Progress Notes (Signed)
Patient ID: Norm Salt, female   DOB: [birth year], 65 y.o.   MRN: [id number]       Patient: Carrie Alvarez, Female    DOB: 03-04-1951, 65 y.o.   MRN: [id number] Visit Date: 09/24/2015  Today's Provider: Margarita Rana, MD   Chief Complaint  Patient presents with  . Medicare Wellness   Subjective:    Annual wellness visit Carrie Alvarez is a 65 y.o. female. She feels well. She reports exercising none. She reports she is sleeping well. 02/21/14 CPE 05/21/07 Pap-neg 06/20/15 Mammogram-BI-RADS 1 04/24/11 Colonoscopy-diverticulosis, colonic angio ectasis, recheck in 8 yrs 01/24/15 BMD-Normal -----------------------------------------------------------   Review of Systems  Constitutional: Negative.   HENT: Negative.   Eyes: Positive for photophobia and itching.  Respiratory: Negative.   Cardiovascular: Negative.   Gastrointestinal: Negative.   Endocrine: Negative.   Genitourinary: Negative.   Musculoskeletal: Positive for back pain.  Skin: Negative.   Allergic/Immunologic: Negative.   Neurological: Negative.   Hematological: Negative.   Psychiatric/Behavioral: Negative.     Social History   Social History  . Marital Status: Married    Spouse Name: N/A  . Number of Children: N/A  . Years of Education: N/A   Occupational History  . Not on file.   Social History Main Topics  . Smoking status: Former Smoker -- 0.50 packs/day    Types: Cigarettes    Quit date: 07/18/2014  . Smokeless tobacco: Never Used     Comment: given smoking and pain information   . Alcohol Use: No  . Drug Use: No  . Sexual Activity: Not on file   Other Topics Concern  . Not on file   Social History Narrative    Past Medical History  Diagnosis Date  . Hypertension   . Back pain   . Obesity   . Allergy   . Carpal tunnel syndrome   . Depression   . GERD (gastroesophageal reflux disease)   . Anemia   . Uterine cancer ([place]) 1994  . Breast cancer ([place]) 2015      LT LUMPECTOMY 12-2014 FOLLOWING CHEMO     Patient Active Problem List   Diagnosis Date Noted  . Allergic rhinitis 09/04/2015  . AA (alopecia areata) 09/04/2015  . Arthritis, degenerative 09/04/2015  . Cancer of uterus ([place]) 09/04/2015  . Avitaminosis D 09/04/2015  . Benign positional vertigo 09/04/2015  . BP (high blood pressure) 03/15/2015  . DDD (degenerative disc disease), lumbar 03/07/2015  . Dizziness 12/25/2014  . Lumbosacral facet joint syndrome 12/10/2014  . Sacroiliac joint dysfunction 12/10/2014  . DDD (degenerative disc disease), lumbosacral 11/13/2014  . Clinical depression 10/13/2014  . Breast cancer metastasized to axillary lymph node ([place]) 06/21/2014  . Hypercholesteremia 08/30/2009  . Compulsive tobacco user syndrome 04/21/2008  . Adaptation reaction 07/18/2007    Past Surgical History  Procedure Laterality Date  . Breast surgery    . Axillary lymph node biopsy Left 12/18/2014    Procedure: AXILLARY LYMPH NODE BIOPSY/;  Surgeon: Robert Bellow, MD;  Location: [facility] ORS;  Service: General;  Laterality: Left;  . Sentinel node biopsy Left 12/18/2014    Procedure: SENTINEL NODE BIOPSY;  Surgeon: Robert Bellow, MD;  Location: [facility] ORS;  Service: General;  Laterality: Left;  . Axillary lymph node dissection Left 12/18/2014    Procedure: AXILLARY LYMPH NODE DISSECTION;  Surgeon: Robert Bellow, MD;  Location: [facility] ORS;  Service: General;  Laterality: Left;  . Breast biopsy Left 05/2014    CORE - POS  .  Abdominal hysterectomy  1994    UTERINE CA    Her family history includes Asthma in her mother; Breast cancer in her sister; Cancer in her father; Colon cancer in her mother; Hypertension in her mother.    Previous Medications   AMLODIPINE (NORVASC) 2.5 MG TABLET    TAKE ONE TABLET BY MOUTH ONCE DAILY   FLUTICASONE (FLONASE) 50 MCG/ACT NASAL SPRAY    Place 1 spray into both nostrils as needed for allergies or rhinitis.   LETROZOLE (FEMARA) 2.5 MG TABLET     Take 1 tablet (2.5 mg total) by mouth daily.   LIDOCAINE-PRILOCAINE (EMLA) CREAM    Apply 1 application topically as needed. Apply to port then cover with saran wrap 1-2 hours prior to chemotherapy appointment   LORATADINE (CLARITIN) 10 MG TABLET    Take 10 mg by mouth daily.   MECLIZINE (ANTIVERT) 12.5 MG TABLET    Take 1 tablet (12.5 mg total) by mouth every 6 (six) hours as needed for dizziness or nausea.   MELOXICAM (MOBIC) 15 MG TABLET    TAKE ONE TABLET BY MOUTH ONCE DAILY   OMEPRAZOLE (PRILOSEC) 20 MG CAPSULE    Take 2 capsules (40 mg total) by mouth 2 (two) times daily.   PYRIDOXINE (VITAMIN B-6) 100 MG TABLET    Take 100 mg by mouth daily.   SERTRALINE (ZOLOFT) 50 MG TABLET    TAKE FOUR TABLETS BY MOUTH AT BEDTIME   VITAMIN B-12 (CYANOCOBALAMIN) 500 MCG TABLET    Take 500 mcg by mouth daily.    Patient Care Team: Margarita Rana, MD as PCP - General (Family Medicine) [name], MD as Consulting Physician (Oncology) Robert Bellow, MD (General Surgery)     Objective:   Vitals: BP 110/88 mmHg  Pulse 78  Temp(Src) 98.4 F (36.9 C) (Oral)  Resp 20  Ht 5' 3.5" (1.613 m)  Wt 180 lb (81.647 kg)  BMI 31.38 kg/m2  SpO2 100%  Physical Exam  Constitutional: She is oriented to person, place, and time. She appears well-developed and well-nourished.  HENT:  Head: Normocephalic and atraumatic.  Right Ear: Tympanic membrane, external ear and ear canal normal.  Left Ear: Tympanic membrane, external ear and ear canal normal.  Nose: Nose normal.  Mouth/Throat: Uvula is midline, oropharynx is clear and moist and mucous membranes are normal.  Eyes: Conjunctivae, EOM and lids are normal. Pupils are equal, round, and reactive to light.  Neck: Trachea normal and normal range of motion. Neck supple. Carotid bruit is not present. No thyroid mass and no thyromegaly present.  Cardiovascular: Normal rate, regular rhythm and normal heart sounds.   Pulmonary/Chest: Effort normal and  breath sounds normal.  Palpable port in right upper chest  Abdominal: Soft. Normal appearance and bowel sounds are normal. There is no hepatosplenomegaly. There is no tenderness.  Genitourinary: No breast swelling, tenderness or discharge.  Musculoskeletal: Normal range of motion.  Lymphadenopathy:    She has no cervical adenopathy.    She has no axillary adenopathy.  Neurological: She is alert and oriented to person, place, and time. She has normal strength. No cranial nerve deficit.  Skin: Skin is warm, dry and intact.  Psychiatric: She has a normal mood and affect. Her speech is normal and behavior is normal. Judgment and thought content normal. Cognition and memory are normal.    Activities of Daily Living In your present state of health, do you have any difficulty performing the following activities: 09/24/2015 12/17/2014  Hearing?  N -  Vision? Y -  Difficulty concentrating or making decisions? N -  Walking or climbing stairs? N -  Dressing or bathing? N -  Doing errands, shopping? N N    Fall Risk Assessment Fall Risk  09/24/2015 09/12/2015 03/07/2015 01/31/2015  Falls in the past year? Yes (No Data) (No Data) (No Data)  Number falls in past yr: 2 or more - - -  Injury with Fall? No - - -     Depression Screen PHQ 2/9 Scores 09/24/2015 09/12/2015 03/07/2015 01/31/2015  PHQ - 2 Score 0 0 0 0  Exception Documentation - Patient refusal Patient refusal Patient refusal    Cognitive Testing - 6-CIT  Correct? Score   What year is it? yes 0 0 or 4  What month is it? yes 0 0 or 3  Memorize:    Pia Mau,  42,  [address],  [place],      What time is it? (within 1 hour) yes 0 0 or 3  Count backwards from 20 yes 0 0, 2, or 4  Name the months of the year yes 0 0, 2, or 4  Repeat name & address above yes 0 0, 2, 4, 6, 8, or 10       TOTAL SCORE  0/28   Interpretation:  Normal  Normal (0-7) Abnormal (8-28)       Assessment & Plan:     Annual Wellness Visit  Reviewed patient's  Family Medical History Reviewed and updated list of patient's medical providers Assessment of cognitive impairment was done Assessed patient's functional ability Established a written schedule for health screening [place] Completed and Reviewed  Exercise Activities and Dietary recommendations Goals    None       There is no immunization history on file for this patient.     1. Medicare annual wellness visit, initial Stable. Patient advised to continue eating healthy and exercise daily. - EKG 12-Lead  2. High risk medication use - VITAMIN D 25 Hydroxy (Vit-D Deficiency, Fractures)  3. Breast cancer metastasized to axillary lymph node, unspecified laterality (HCC) - VITAMIN D 25 Hydroxy (Vit-D Deficiency, Fractures)  4. Essential hypertension - CBC with Differential/Platelet - Comprehensive metabolic panel  5. Hypercholesteremia - Lipid Panel With LDL/HDL Ratio - TSH     Patient seen and examined by Dr. Jerrell Belfast, and note scribed by Philbert Riser. Dimas, CMA.  I have reviewed the document for accuracy and completeness and I agree with above. Jerrell Belfast, MD   Margarita Rana, MD    ------------------------------------------------------------------------------------------------------------

## 2015-09-25 ENCOUNTER — Telehealth: Payer: Self-pay

## 2015-09-25 ENCOUNTER — Encounter: Payer: Self-pay | Admitting: Pain Medicine

## 2015-09-25 ENCOUNTER — Ambulatory Visit: Payer: Medicare Other | Attending: Pain Medicine | Admitting: Pain Medicine

## 2015-09-25 VITALS — BP 166/100 | HR 67 | Temp 98.0°F | Resp 16 | Ht 63.0 in | Wt 180.0 lb

## 2015-09-25 DIAGNOSIS — M79606 Pain in leg, unspecified: Secondary | ICD-10-CM | POA: Diagnosis present

## 2015-09-25 DIAGNOSIS — M545 Low back pain: Secondary | ICD-10-CM | POA: Diagnosis not present

## 2015-09-25 DIAGNOSIS — M47817 Spondylosis without myelopathy or radiculopathy, lumbosacral region: Secondary | ICD-10-CM | POA: Diagnosis not present

## 2015-09-25 DIAGNOSIS — M5136 Other intervertebral disc degeneration, lumbar region: Secondary | ICD-10-CM | POA: Diagnosis not present

## 2015-09-25 DIAGNOSIS — M47816 Spondylosis without myelopathy or radiculopathy, lumbar region: Secondary | ICD-10-CM | POA: Insufficient documentation

## 2015-09-25 DIAGNOSIS — M5137 Other intervertebral disc degeneration, lumbosacral region: Secondary | ICD-10-CM

## 2015-09-25 DIAGNOSIS — M533 Sacrococcygeal disorders, not elsewhere classified: Secondary | ICD-10-CM

## 2015-09-25 MED ORDER — BUPIVACAINE HCL (PF) 0.25 % IJ SOLN
INTRAMUSCULAR | Status: AC
  Administered 2015-09-25: 10:00:00
  Filled 2015-09-25: qty 30

## 2015-09-25 MED ORDER — TRIAMCINOLONE ACETONIDE 40 MG/ML IJ SUSP: 40.0000 mg | Freq: Once | INTRAMUSCULAR | Status: DC

## 2015-09-25 MED ORDER — CEFAZOLIN SODIUM 1-5 GM-% IV SOLN: 1.0000 g | Freq: Once | INTRAVENOUS | Status: DC

## 2015-09-25 MED ORDER — BUPIVACAINE HCL (PF) 0.25 % IJ SOLN: 30.0000 mL | Freq: Once | INTRAMUSCULAR | Status: DC

## 2015-09-25 MED ORDER — FENTANYL CITRATE (PF) 100 MCG/2ML IJ SOLN: 100.0000 ug | Freq: Once | INTRAMUSCULAR | Status: DC

## 2015-09-25 MED ORDER — MIDAZOLAM HCL 5 MG/5ML IJ SOLN
INTRAMUSCULAR | Status: AC
  Administered 2015-09-25: 4 mg via INTRAVENOUS
  Filled 2015-09-25: qty 5

## 2015-09-25 MED ORDER — LACTATED RINGERS IV SOLN: 1000.0000 mL | INTRAVENOUS | Status: DC

## 2015-09-25 MED ORDER — MIDAZOLAM HCL 5 MG/5ML IJ SOLN: 5.0000 mg | Freq: Once | INTRAMUSCULAR | Status: DC

## 2015-09-25 MED ORDER — FENTANYL CITRATE (PF) 100 MCG/2ML IJ SOLN
INTRAMUSCULAR | Status: AC
  Administered 2015-09-25: 100 ug via INTRAVENOUS
  Filled 2015-09-25: qty 2

## 2015-09-25 MED ORDER — CEFAZOLIN SODIUM 1 G IJ SOLR
INTRAMUSCULAR | Status: AC
  Administered 2015-09-25: 1 g via INTRAVENOUS
  Filled 2015-09-25: qty 10

## 2015-09-25 MED ORDER — TRIAMCINOLONE ACETONIDE 40 MG/ML IJ SUSP
INTRAMUSCULAR | Status: AC
  Administered 2015-09-25: 10:00:00
  Filled 2015-09-25: qty 1

## 2015-09-25 MED ORDER — ORPHENADRINE CITRATE 30 MG/ML IJ SOLN: 60.0000 mg | Freq: Once | INTRAMUSCULAR | Status: DC

## 2015-09-25 MED ORDER — ORPHENADRINE CITRATE 30 MG/ML IJ SOLN
INTRAMUSCULAR | Status: AC
  Filled 2015-09-25: qty 2

## 2015-09-25 NOTE — Patient Instructions (Addendum)
PLAN   Continue present medication  and please obtain your antibiotic Ceftin and begin taking Ceftin antibiotic the day as prescribed  F/U PCP Dr. Venia Minks for evaliation of  BP and general medical  condition. Please see Dr. Venia Minks this week for evaluation of your blood pressure F/U oncology  F/U surgical evaluation  F/U neurological evaluation  May consider radiofrequency rhizolysis or intraspinal procedures pending response to present treatment and F/U evaluation   Patient to call Pain Management Center should patient have concerns prior to scheduled return appointment

## 2015-09-25 NOTE — Progress Notes (Signed)
Safety precautions to be maintained throughout the outpatient stay will include: orient to surroundings, keep bed in low position, maintain call bell within reach at all times, provide assistance with transfer out of bed and ambulation.  

## 2015-09-25 NOTE — Telephone Encounter (Signed)
Called [facility] to sch pt appointment for eval of BP

## 2015-09-25 NOTE — Progress Notes (Signed)
Subjective:    Patient ID: Carrie Alvarez, female    DOB: 05-30-1951, 65 y.o.   MRN: [id number]  HPI  PROCEDURE PERFORMED: Lumbar facet (medial branch block)   NOTE: The patient is a 65 y.o. female who returns to [facility] for further evaluation and treatment of pain involving the lumbar and lower extremity region. MRI  revealed the patient to be with evidence of degenerative disc disease lumbar spine. Multilevel degenerative changes lumbar spine. There is concern regarding significant component of patient's pain began due to lumbar facet syndrome. The risks, benefits, and expectations of the procedure have been discussed and explained to the patient who was understanding and in agreement with suggested treatment plan. We will proceed with interventional treatment as discussed and as explained to the patient who was understanding and wished to proceed with procedure as planned.   DESCRIPTION OF PROCEDURE: Lumbar facet (medial branch block) with IV Versed, IV fentanyl conscious sedation, EKG, blood pressure, pulse, and pulse oximetry monitoring. The procedure was performed with the patient in the prone position. Betadine prep of proposed entry site performed.   NEEDLE PLACEMENT AT: Left L 2 lumbar facet (medial branch block). Under fluoroscopic guidance with oblique orientation of 15 degrees, a 22-gauge needle was inserted at the L 2 vertebral body level with needle placed at the targeted area of Burton's Eye or Eye of the Scotty Dog with documentation of needle placement in the superior and lateral border of targeted area of Burton's Eye or Eye of the Scotty Dog with oblique orientation of 15 degrees. Following documentation of needle placement at the L 2 vertebral body level, needle placement was then accomplished at the L 3 vertebral body level.   NEEDLE PLACEMENT AT L 3, L4, and L5 VERTEBRAL BODY LEVELS ON THE LEFT SIDE The procedure was performed at the L3, L4, and L5  vertebral body levels exactly as was performed at the L 2 vertebral body level utilizing the same technique and under fluoroscopic guidance.  NEEDLE PLACEMENT AT THE SACRAL ALA with AP view of the lumbosacral spine. With the patient in the prone position, Betadine prep of proposed entry site accomplished, a 22 gauge needle was inserted in the region of the sacral ala (groove formed by the superior articulating process of S1 and the sacral wing). Following documentation of needle placement at the sacral ala,     Needle placement was then verified at all levels on lateral view. Following documentation of needle placement at all levels on lateral view and following negative aspiration for heme and CSF, each level was injected with 1 mL of 0.25% bupivacaine with Kenalog.     LUMBAR FACET, MEDIAL BRANCH NERVE, BLOCKS PERFORMED ON THE RIGHT SIDE   The procedure was performed on the right side exactly as was performed on the left side at the same levels and utilizing the same technique under fluoroscopic guidance.     The patient tolerated the procedure well. A total of 40 mg of Kenalog was utilized for the procedure.   PLAN:  1. Medications: The patient will continue presently prescribed medications. 2. May consider modification of treatment regimen at time of return appointment pending response to treatment rendered on today's visit. 3. The patient is to follow-up with primary care physician Dr. Venia Minks for further evaluation of blood pressure and general medical condition status post steroid injection performed on today's visit. We call the office of Dr. Venia Minks to schedule patient for appointment this week for evaluation of  blood pressure and general medical condition . Patient's blood pressure was noted to increase and decision was made to have patient follow up with Dr. Venia Minks for further assessment of patient's blood pressure and general medical condition 4. Surgical follow-up evaluation.  Has been addressed 5. Neurological follow-up evaluation. May consider PNCV EMG studies and other studies 6. Oncological evaluation as discussed 7. The patient may be candidate for radiofrequency procedures, implantation type procedures, and other treatment pending response to treatment and follow-up evaluation. 8. The patient has been advised to call the Pain Management Center prior to scheduled return appointment should there be significant change in condition or should patient have other concerns regarding condition prior to scheduled return appointment.  The patient is understanding and in agreement with suggested treatment plan.   Review of Systems     Objective:   Physical Exam        Assessment & Plan:

## 2015-09-26 ENCOUNTER — Encounter: Payer: Self-pay | Admitting: Family Medicine

## 2015-09-26 ENCOUNTER — Ambulatory Visit (INDEPENDENT_AMBULATORY_CARE_PROVIDER_SITE_OTHER): Payer: Medicare Other | Admitting: Family Medicine

## 2015-09-26 ENCOUNTER — Telehealth: Payer: Self-pay

## 2015-09-26 VITALS — BP 136/88 | HR 84 | Temp 98.2°F | Resp 16 | Wt 181.0 lb

## 2015-09-26 DIAGNOSIS — I10 Essential (primary) hypertension: Secondary | ICD-10-CM | POA: Diagnosis not present

## 2015-09-26 NOTE — Progress Notes (Signed)
Subjective:    Patient ID: Carrie Alvarez, female    DOB: 05-Jul-1951, 65 y.o.   MRN: [id number]  Hypertension This is a chronic problem. The problem is uncontrolled (Pt was advised to FU with PCP for this problem by the pain clinic, who recorded pt's BP as 160/100 at LOV.). Associated symptoms include blurred vision (cataracts). Pertinent negatives include no anxiety, chest pain, headaches, malaise/fatigue, neck pain, orthopnea, palpitations, peripheral edema, shortness of breath or sweats. Risk factors for coronary artery disease include post-menopausal state. Treatments tried: Currently taking Amlodipine 2.5 mg. The current treatment provides moderate improvement. There are no compliance problems.       Review of Systems  Constitutional: Negative for malaise/fatigue.  Eyes: Positive for blurred vision (cataracts).  Respiratory: Negative for shortness of breath.   Cardiovascular: Negative for chest pain, palpitations and orthopnea.  Musculoskeletal: Negative for neck pain.  Neurological: Negative for headaches.   BP 136/88 mmHg  Pulse 84  Temp(Src) 98.2 F (36.8 C) (Oral)  Resp 16  Wt 181 lb (82.101 kg)   Patient Active Problem List   Diagnosis Date Noted  . Allergic rhinitis 09/04/2015  . AA (alopecia areata) 09/04/2015  . Arthritis, degenerative 09/04/2015  . Cancer of uterus ([place]) 09/04/2015  . Avitaminosis D 09/04/2015  . Benign positional vertigo 09/04/2015  . BP (high blood pressure) 03/15/2015  . DDD (degenerative disc disease), lumbar 03/07/2015  . Dizziness 12/25/2014  . Lumbosacral facet joint syndrome 12/10/2014  . Sacroiliac joint dysfunction 12/10/2014  . DDD (degenerative disc disease), lumbosacral 11/13/2014  . Clinical depression 10/13/2014  . Breast cancer metastasized to axillary lymph node ([place]) 06/21/2014  . Hypercholesteremia 08/30/2009  . Compulsive tobacco user syndrome 04/21/2008  . Adaptation reaction 07/18/2007   Past Medical History    Diagnosis Date  . Hypertension   . Back pain   . Obesity   . Allergy   . Carpal tunnel syndrome   . Depression   . GERD (gastroesophageal reflux disease)   . Anemia   . Uterine cancer ([place]) 1994  . Breast cancer ([place]) 2015    LT LUMPECTOMY 12-2014 FOLLOWING CHEMO   Current Outpatient Prescriptions on File Prior to Visit  Medication Sig  . amLODipine (NORVASC) 2.5 MG tablet TAKE ONE TABLET BY MOUTH ONCE DAILY  . fluticasone (FLONASE) 50 MCG/ACT nasal spray Place 1 spray into both nostrils as needed for allergies or rhinitis.  Marland Kitchen letrozole (FEMARA) 2.5 MG tablet Take 1 tablet (2.5 mg total) by mouth daily.  Marland Kitchen lidocaine-prilocaine (EMLA) cream Apply 1 application topically as needed. Apply to port then cover with saran wrap 1-2 hours prior to chemotherapy appointment  . loratadine (CLARITIN) 10 MG tablet Take 10 mg by mouth daily.  . meclizine (ANTIVERT) 12.5 MG tablet Take 1 tablet (12.5 mg total) by mouth every 6 (six) hours as needed for dizziness or nausea.  . meloxicam (MOBIC) 15 MG tablet TAKE ONE TABLET BY MOUTH ONCE DAILY  . omeprazole (PRILOSEC) 20 MG capsule Take 2 capsules (40 mg total) by mouth 2 (two) times daily.  Marland Kitchen pyridOXINE (VITAMIN B-6) 100 MG tablet Take 100 mg by mouth daily.  . sertraline (ZOLOFT) 50 MG tablet TAKE FOUR TABLETS BY MOUTH AT BEDTIME  . vitamin B-12 (CYANOCOBALAMIN) 500 MCG tablet Take 500 mcg by mouth daily.   Current Facility-Administered Medications on File Prior to Visit  Medication  . bupivacaine (PF) (MARCAINE) 0.25 % injection 30 mL  . ceFAZolin (ANCEF) IVPB 1 g/50 mL premix  .  fentaNYL (SUBLIMAZE) injection 100 mcg  . heparin lock flush 100 unit/mL  . lactated ringers infusion 1,000 mL  . midazolam (VERSED) 5 MG/5ML injection 5 mg  . orphenadrine (NORFLEX) injection 60 mg  . sodium chloride 0.9 % injection 10 mL  . triamcinolone acetonide (KENALOG-40) injection 40 mg   No Known Allergies Past Surgical History  Procedure Laterality Date   . Breast surgery    . Axillary lymph node biopsy Left 12/18/2014    Procedure: AXILLARY LYMPH NODE BIOPSY/;  Surgeon: Robert Bellow, MD;  Location: [facility] ORS;  Service: General;  Laterality: Left;  . Sentinel node biopsy Left 12/18/2014    Procedure: SENTINEL NODE BIOPSY;  Surgeon: Robert Bellow, MD;  Location: [facility] ORS;  Service: General;  Laterality: Left;  . Axillary lymph node dissection Left 12/18/2014    Procedure: AXILLARY LYMPH NODE DISSECTION;  Surgeon: Robert Bellow, MD;  Location: [facility] ORS;  Service: General;  Laterality: Left;  . Breast biopsy Left 05/2014    CORE - POS  . Abdominal hysterectomy  1994    UTERINE CA   Social History   Social History  . Marital Status: Married    Spouse Name: N/A  . Number of Children: N/A  . Years of Education: N/A   Occupational History  . Not on file.   Social History Main Topics  . Smoking status: Former Smoker -- 0.50 packs/day    Types: Cigarettes    Quit date: 07/18/2014  . Smokeless tobacco: Never Used     Comment: given smoking and pain information   . Alcohol Use: No  . Drug Use: No  . Sexual Activity: Not on file   Other Topics Concern  . Not on file   Social History Narrative   Family History  Problem Relation Age of Onset  . Breast cancer Sister   . Colon cancer Mother   . Hypertension Mother   . Asthma Mother   . Cancer Father        Objective:   Physical Exam  Constitutional: She appears well-developed and well-nourished.  Psychiatric: She has a normal mood and affect. Her behavior is normal.       Assessment & Plan:  1. Essential hypertension Stable. Believe elevation in BP at pain clinic was secondary to pain or anxiousness. Am not going to make any changes at this time. Will continue to monitor.  Patient seen and examined by Jerrell Belfast, MD, and note scribed by Renaldo Fiddler, CMA.  I have reviewed the document for accuracy and completeness and I agree with above. Jerrell Belfast,  MD   Margarita Rana, MD

## 2015-09-26 NOTE — Telephone Encounter (Signed)
Post procedure call....message left.

## 2015-09-27 ENCOUNTER — Inpatient Hospital Stay: Payer: Medicare Other

## 2015-09-27 DIAGNOSIS — Z9221 Personal history of antineoplastic chemotherapy: Secondary | ICD-10-CM | POA: Diagnosis not present

## 2015-09-27 DIAGNOSIS — C773 Secondary and unspecified malignant neoplasm of axilla and upper limb lymph nodes: Principal | ICD-10-CM

## 2015-09-27 DIAGNOSIS — C50912 Malignant neoplasm of unspecified site of left female breast: Secondary | ICD-10-CM | POA: Diagnosis not present

## 2015-09-27 DIAGNOSIS — C50919 Malignant neoplasm of unspecified site of unspecified female breast: Secondary | ICD-10-CM | POA: Diagnosis not present

## 2015-09-27 DIAGNOSIS — Z79899 Other long term (current) drug therapy: Secondary | ICD-10-CM | POA: Diagnosis not present

## 2015-09-27 DIAGNOSIS — N644 Mastodynia: Secondary | ICD-10-CM | POA: Diagnosis not present

## 2015-09-27 DIAGNOSIS — T451X5S Adverse effect of antineoplastic and immunosuppressive drugs, sequela: Secondary | ICD-10-CM | POA: Diagnosis not present

## 2015-09-27 DIAGNOSIS — D649 Anemia, unspecified: Secondary | ICD-10-CM | POA: Diagnosis not present

## 2015-09-27 DIAGNOSIS — K219 Gastro-esophageal reflux disease without esophagitis: Secondary | ICD-10-CM | POA: Diagnosis not present

## 2015-09-27 DIAGNOSIS — Z87891 Personal history of nicotine dependence: Secondary | ICD-10-CM | POA: Diagnosis not present

## 2015-09-27 DIAGNOSIS — R51 Headache: Secondary | ICD-10-CM | POA: Diagnosis not present

## 2015-09-27 DIAGNOSIS — Z452 Encounter for adjustment and management of vascular access device: Secondary | ICD-10-CM | POA: Diagnosis not present

## 2015-09-27 DIAGNOSIS — Z170 Estrogen receptor positive status [ER+]: Secondary | ICD-10-CM | POA: Diagnosis not present

## 2015-09-27 DIAGNOSIS — Z79811 Long term (current) use of aromatase inhibitors: Secondary | ICD-10-CM | POA: Diagnosis not present

## 2015-09-27 DIAGNOSIS — I10 Essential (primary) hypertension: Secondary | ICD-10-CM | POA: Diagnosis not present

## 2015-09-27 DIAGNOSIS — G620 Drug-induced polyneuropathy: Secondary | ICD-10-CM | POA: Diagnosis not present

## 2015-09-27 DIAGNOSIS — E7800 Pure hypercholesterolemia, unspecified: Secondary | ICD-10-CM | POA: Diagnosis not present

## 2015-09-27 MED ORDER — HEPARIN SOD (PORK) LOCK FLUSH 100 UNIT/ML IV SOLN
500.0000 [IU] | Freq: Once | INTRAVENOUS | Status: AC
  Administered 2015-09-27: 500 [IU] via INTRAVENOUS
  Filled 2015-09-27: qty 5

## 2015-09-27 MED ORDER — SODIUM CHLORIDE 0.9% FLUSH
10.0000 mL | Freq: Once | INTRAVENOUS | Status: AC
  Administered 2015-09-27: 10 mL via INTRAVENOUS
  Filled 2015-09-27: qty 10

## 2015-09-28 LAB — CBC WITH DIFFERENTIAL/PLATELET
Basophils Absolute: 0 10*3/uL (ref 0.0–0.2)
Basos: 0 %
EOS (ABSOLUTE): 0 10*3/uL (ref 0.0–0.4)
Eos: 0 %
Hematocrit: 34.7 % (ref 34.0–46.6)
Hemoglobin: 12.1 g/dL (ref 11.1–15.9)
Immature Grans (Abs): 0 10*3/uL (ref 0.0–0.1)
Immature Granulocytes: 0 %
Lymphocytes Absolute: 1.4 10*3/uL (ref 0.7–3.1)
Lymphs: 16 %
MCH: 28.1 pg (ref 26.6–33.0)
MCHC: 34.9 g/dL (ref 31.5–35.7)
MCV: 81 fL (ref 79–97)
Monocytes Absolute: 0.4 10*3/uL (ref 0.1–0.9)
Monocytes: 4 %
Neutrophils Absolute: 6.8 10*3/uL (ref 1.4–7.0)
Neutrophils: 80 %
Platelets: 259 10*3/uL (ref 150–379)
RBC: 4.3 x10E6/uL (ref 3.77–5.28)
RDW: 15 % (ref 12.3–15.4)
WBC: 8.7 10*3/uL (ref 3.4–10.8)

## 2015-09-28 LAB — COMPREHENSIVE METABOLIC PANEL
ALT: 12 IU/L (ref 0–32)
AST: 16 IU/L (ref 0–40)
Albumin/Globulin Ratio: 1.6 (ref 1.2–2.2)
Albumin: 4 g/dL (ref 3.6–4.8)
Alkaline Phosphatase: 94 IU/L (ref 39–117)
BUN/Creatinine Ratio: 19 (ref 11–26)
BUN: 19 mg/dL (ref 8–27)
Bilirubin Total: 0.2 mg/dL (ref 0.0–1.2)
CO2: 20 mmol/L (ref 18–29)
Calcium: 9.7 mg/dL (ref 8.7–10.3)
Chloride: 106 mmol/L (ref 96–106)
Creatinine, Ser: 0.98 mg/dL (ref 0.57–1.00)
GFR calc Af Amer: 70 mL/min/{1.73_m2} (ref >59)
GFR calc non Af Amer: 61 mL/min/{1.73_m2} (ref >59)
Globulin, Total: 2.5 g/dL (ref 1.5–4.5)
Glucose: 85 mg/dL (ref 65–99)
Potassium: 3.8 mmol/L (ref 3.5–5.2)
Sodium: 143 mmol/L (ref 134–144)
Total Protein: 6.5 g/dL (ref 6.0–8.5)

## 2015-09-28 LAB — LIPID PANEL WITH LDL/HDL RATIO
Cholesterol, Total: 221 mg/dL — ABNORMAL HIGH (ref 100–199)
HDL: 69 mg/dL (ref >39)
LDL Calculated: 139 mg/dL — ABNORMAL HIGH (ref 0–99)
LDl/HDL Ratio: 2 ratio units (ref 0.0–3.2)
Triglycerides: 66 mg/dL (ref 0–149)
VLDL Cholesterol Cal: 13 mg/dL (ref 5–40)

## 2015-09-28 LAB — TSH: TSH: 1.65 u[IU]/mL (ref 0.450–4.500)

## 2015-09-28 LAB — VITAMIN D 25 HYDROXY (VIT D DEFICIENCY, FRACTURES): Vit D, 25-Hydroxy: 10 ng/mL — ABNORMAL LOW (ref 30.0–100.0)

## 2015-09-29 NOTE — Progress Notes (Signed)
Carrie Alvarez  [phone] [phone]) [phone]  ID: Carrie Alvarez OB: [birth year]  MR#: [id number]  UDJ#:[id number]  Patient Care Team: Margarita Rana, MD as PCP - General (Family Medicine) [name], MD as Consulting Physician (Oncology) Robert Bellow, MD (General Surgery)  CHIEF COMPLAINT:  Chief Complaint  Patient presents with  . Breast Cancer    INTERVAL HISTORY: Patient returns to clinic today for routine evaluation. She has occasional left breast pain, but otherwise feels well. She is tolerating his all without significant side effects. She continues to have peripheral neuropathy. She has no other neurologic complaints. She denies any recent fevers. She has no chest pain or shortness of breath. She denies any nausea, vomiting, constipation, or diarrhea.  She has no urinary complaints. Patient offers no further specific complaints today.    REVIEW OF SYSTEMS:   Review of Systems  Constitutional: Negative.  Negative for fever, weight loss and malaise/fatigue.  Respiratory: Negative.  Negative for shortness of breath.   Cardiovascular: Negative.  Negative for chest pain.  Gastrointestinal: Negative.   Neurological: Positive for sensory change. Negative for dizziness, weakness and headaches.  Psychiatric/Behavioral: The patient is nervous/anxious.     As per HPI. Otherwise, a complete review of systems is negatve.  PAST MEDICAL HISTORY: Past Medical History  Diagnosis Date  . Hypertension   . Back pain   . Obesity   . Allergy   . Carpal tunnel syndrome   . Depression   . GERD (gastroesophageal reflux disease)   . Anemia   . Uterine cancer ([place]) 1994  . Breast cancer ([place]) 2015    LT LUMPECTOMY 12-2014 FOLLOWING CHEMO    PAST SURGICAL HISTORY: Past Surgical History  Procedure Laterality Date  . Breast surgery    . Axillary lymph node biopsy Left 12/18/2014    Procedure: AXILLARY LYMPH NODE BIOPSY/;  Surgeon: Robert Bellow, MD;  Location: [facility] ORS;  Service: General;  Laterality: Left;  . Sentinel node biopsy Left 12/18/2014    Procedure: SENTINEL NODE BIOPSY;  Surgeon: Robert Bellow, MD;  Location: [facility] ORS;  Service: General;  Laterality: Left;  . Axillary lymph node dissection Left 12/18/2014    Procedure: AXILLARY LYMPH NODE DISSECTION;  Surgeon: Robert Bellow, MD;  Location: [facility] ORS;  Service: General;  Laterality: Left;  . Breast biopsy Left 05/2014    CORE - POS  . Abdominal hysterectomy  1994    UTERINE CA    FAMILY HISTORY Family History  Problem Relation Age of Onset  . Breast cancer Sister   . Colon cancer Mother   . Hypertension Mother   . Asthma Mother   . Cancer Father        ADVANCED DIRECTIVES:    HEALTH MAINTENANCE: Social History  Substance Use Topics  . Smoking status: Former Smoker -- 0.50 packs/day    Types: Cigarettes    Quit date: 07/18/2014  . Smokeless tobacco: Never Used     Comment: given smoking and pain information   . Alcohol Use: No     Colonoscopy:  PAP:  Bone density:  Lipid panel:  No Known Allergies  Current Outpatient Prescriptions  Medication Sig Dispense Refill  . amLODipine (NORVASC) 2.5 MG tablet TAKE ONE TABLET BY MOUTH ONCE DAILY 90 tablet 3  . fluticasone (FLONASE) 50 MCG/ACT nasal spray Place 1 spray into both nostrils as needed for allergies or rhinitis.    Marland Kitchen letrozole (FEMARA) 2.5 MG tablet Take 1 tablet (2.5  mg total) by mouth daily. 90 tablet 3  . lidocaine-prilocaine (EMLA) cream Apply 1 application topically as needed. Apply to port then cover with saran wrap 1-2 hours prior to chemotherapy appointment 30 g 1  . loratadine (CLARITIN) 10 MG tablet Take 10 mg by mouth daily.    . meclizine (ANTIVERT) 12.5 MG tablet Take 1 tablet (12.5 mg total) by mouth every 6 (six) hours as needed for dizziness or nausea. 60 tablet 0  . meloxicam (MOBIC) 15 MG tablet TAKE ONE TABLET BY MOUTH ONCE DAILY 90 tablet 1  . omeprazole  (PRILOSEC) 20 MG capsule Take 2 capsules (40 mg total) by mouth 2 (two) times daily. 120 capsule 2  . pyridOXINE (VITAMIN B-6) 100 MG tablet Take 100 mg by mouth daily.    . sertraline (ZOLOFT) 50 MG tablet TAKE FOUR TABLETS BY MOUTH AT BEDTIME 120 tablet 5  . vitamin B-12 (CYANOCOBALAMIN) 500 MCG tablet Take 500 mcg by mouth daily.     Current Facility-Administered Medications  Medication Dose Route Frequency Provider Last Rate Last Dose  . bupivacaine (PF) (MARCAINE) 0.25 % injection 30 mL  30 mL Other Once [name], MD      . ceFAZolin (ANCEF) IVPB 1 g/50 mL premix  1 g Intravenous Once [name], MD      . fentaNYL (SUBLIMAZE) injection 100 mcg  100 mcg Intravenous Once [name], MD      . lactated ringers infusion 1,000 mL  1,000 mL Intravenous Continuous [name], MD      . midazolam (VERSED) 5 MG/5ML injection 5 mg  5 mg Intravenous Once [name], MD      . orphenadrine (NORFLEX) injection 60 mg  60 mg Intramuscular Once [name], MD      . triamcinolone acetonide (KENALOG-40) injection 40 mg  40 mg Other Once [name], MD       Facility-Administered Medications Ordered in Other Visits  Medication Dose Route Frequency Provider Last Rate Last Dose  . heparin lock flush 100 unit/mL  500 Units Intravenous Once [name], MD      . sodium chloride 0.9 % injection 10 mL  10 mL Intravenous Once [name], MD        OBJECTIVE: Filed Vitals:   09/23/15 1544  BP: 129/89  Pulse: 78  Temp: 97.1 F (36.2 C)  Resp: 16     Body mass index is 32.07 kg/(m^2).    ECOG FS:1 - Symptomatic but completely ambulatory  General: Well-developed, well-nourished, no acute distress. Eyes: anicteric sclera. Breasts: Bilateral breast and axilla without lumps or masses. Lungs: Clear to auscultation bilaterally. Heart: Regular rate and rhythm. No rubs, murmurs, or gallops. Abdomen: Soft, nontender, nondistended. No organomegaly noted, normoactive bowel  sounds. Musculoskeletal: No edema, cyanosis, or clubbing. Neuro: Alert, answering all questions appropriately. Cranial nerves grossly intact. Skin: No rashes or petechiae noted. Psych: Normal affect.    LAB RESULTS:  Lab Results  Component Value Date   NA 143 09/27/2015   K 3.8 09/27/2015   CL 106 09/27/2015   CO2 20 09/27/2015   GLUCOSE 85 09/27/2015   BUN 19 09/27/2015   CREATININE 0.98 09/27/2015   CALCIUM 9.7 09/27/2015   PROT 6.5 09/27/2015   ALBUMIN 4.0 09/27/2015   AST 16 09/27/2015   ALT 12 09/27/2015   ALKPHOS 94 09/27/2015   BILITOT 0.2 09/27/2015   GFRNONAA 61 09/27/2015   GFRAA 70 09/27/2015    Lab Results  Component Value Date  WBC 8.7 09/27/2015   NEUTROABS 6.8 09/27/2015   HGB 11.4* 09/03/2015   HCT 34.7 09/27/2015   MCV 81 09/27/2015   PLT 259 09/27/2015     STUDIES: Ct Head Wo Contrast  09/03/2015  CLINICAL DATA:  Headache and hypertension. EXAM: CT HEAD WITHOUT CONTRAST TECHNIQUE: Contiguous axial images were obtained from the base of the skull through the vertex without intravenous contrast. COMPARISON:  MRI from 12/31/2014. FINDINGS: There is no evidence for acute hemorrhage, hydrocephalus, mass lesion, or abnormal extra-axial fluid collection. No definite CT evidence for acute infarction. Diffuse loss of parenchymal volume is consistent with atrophy. Patchy low attenuation in the deep hemispheric and periventricular white matter is nonspecific, but likely reflects chronic microvascular ischemic demyelination. The visualized paranasal sinuses and mastoid air cells are clear. 10 mm extra-axial calcification in the right middle cranial fossa is stable since prior study and also comparing back to 08/17/2012 and may represent a small meningioma. IMPRESSION: 1. No acute intracranial abnormality. 2. Atrophy with chronic small vessel white matter ischemic disease. Electronically Signed   By: Misty Stanley M.D.   On: 09/03/2015 19:06    ASSESSMENT: Stage IIa  ER+ breast cancer with no breast lesion and axillary lymph node metastasis. (TxN1M0)  HER-2 negative.   PLAN:   1. Breast cancer: Despite no obvious breast lesion on mammogram or breast MRI, pathology and pattern of spread was consistent with primary breast cancer. CT and bone scan with no other evidence of malignancy. Previously because of difficulty with treatment, patient only received 3 cycles of Adriamycin and Cytoxan. She did not complete 12 cycles of weekly Taxol. It was elected not to pursue axillary node dissection given the potential morbidity of this procedure. It was also elected not to pursue adjuvant XRT given there was no primary breast lesion. Proceed with letrozole which patient will require to take for 5 years completing in July 2021. Baseline bone mineral density on January 24, 2015 with a T score of -0.7, which is considered normal. Repeat in 2 years. Her most recent mammogram on June 20, 2015 was reported as BI-RADS 1, repeat in one year. Return to clinic in 3 months for routine evaluation. 2. Family history: Patient's sister reports she has positive for Lynch syndrome and we are attempting to confirm these results. Patient may also benefit from genetic testing herself in the near future.  Patient had a colonoscopy in October 2012 that was reported as normal. 3. Anemia: Nearly resolved. 4. Hypertension: improved now that patient has restarted her medications as prescribed. 5. Peripheral neuropathy: Secondary to Taxol. Continue gabapentin.  Patient expressed understanding and was in agreement with this plan. She also understands that She can call clinic at any time with any questions, concerns, or complaints.   Breast cancer metastasized to axillary lymph node   Staging form: Breast, AJCC 7th Edition     Clinical stage from 10/22/2014: Stage Unknown (TX, N1, M0) - Signed by [name], MD on 10/22/2014   [name], MD   09/29/2015 10:32 AM

## 2015-09-30 ENCOUNTER — Telehealth: Payer: Self-pay

## 2015-09-30 NOTE — Telephone Encounter (Signed)
-----   Message from Margarita Rana, MD sent at 09/28/2015  2:07 PM EDT ----- Labs ok with several minor abnormalities. Vit D is very low at 10.   Recommend trial of  Vit D 5000 iu daily and recheck level in 3 months. May help her pain and energy level. Very important to take high daily dose and recheck in 3 months before I leave. Also, cholesterol mildly elevated but 10 year risk of heart disease secondary to her other risk factors is 13 percent. Baby ASA recommended daily and also cholesterol medication.  Please see if would like to try a low dose statin. Thanks.

## 2015-09-30 NOTE — Telephone Encounter (Signed)
Advised patient as below. Patient reports that she will start baby aspirin and vit D supplement daily. She reports that she does not want to start cholesterol medication. She would rather see if she could change her eating habits and increase her activity. Advised patient that we will recheck Vit D in 3 months for stability. Should we recheck cholesterol as well? Please advise. Thanks!

## 2015-09-30 NOTE — Telephone Encounter (Signed)
Advised patient as below.  

## 2015-09-30 NOTE — Telephone Encounter (Signed)
Ok to check all in 3 months. Make sure gets labs the week of the 20th. Do not think I can order labs after that week. Thanks.

## 2015-10-03 ENCOUNTER — Ambulatory Visit: Payer: Medicare Other | Attending: Pain Medicine | Admitting: Pain Medicine

## 2015-10-03 ENCOUNTER — Encounter: Payer: Self-pay | Admitting: Pain Medicine

## 2015-10-03 VITALS — BP 140/106 | HR 77 | Temp 98.1°F | Resp 18 | Ht 63.0 in | Wt 180.0 lb

## 2015-10-03 DIAGNOSIS — M79606 Pain in leg, unspecified: Secondary | ICD-10-CM | POA: Diagnosis present

## 2015-10-03 DIAGNOSIS — M47816 Spondylosis without myelopathy or radiculopathy, lumbar region: Secondary | ICD-10-CM | POA: Diagnosis not present

## 2015-10-03 DIAGNOSIS — M4806 Spinal stenosis, lumbar region: Secondary | ICD-10-CM | POA: Diagnosis not present

## 2015-10-03 DIAGNOSIS — M461 Sacroiliitis, not elsewhere classified: Secondary | ICD-10-CM | POA: Diagnosis not present

## 2015-10-03 DIAGNOSIS — M533 Sacrococcygeal disorders, not elsewhere classified: Secondary | ICD-10-CM | POA: Diagnosis not present

## 2015-10-03 DIAGNOSIS — M5137 Other intervertebral disc degeneration, lumbosacral region: Secondary | ICD-10-CM

## 2015-10-03 DIAGNOSIS — M5136 Other intervertebral disc degeneration, lumbar region: Secondary | ICD-10-CM | POA: Diagnosis not present

## 2015-10-03 DIAGNOSIS — M47817 Spondylosis without myelopathy or radiculopathy, lumbosacral region: Secondary | ICD-10-CM | POA: Diagnosis not present

## 2015-10-03 DIAGNOSIS — M5416 Radiculopathy, lumbar region: Secondary | ICD-10-CM | POA: Diagnosis not present

## 2015-10-03 DIAGNOSIS — M791 Myalgia: Secondary | ICD-10-CM | POA: Diagnosis not present

## 2015-10-03 DIAGNOSIS — M545 Low back pain: Secondary | ICD-10-CM | POA: Diagnosis present

## 2015-10-03 MED ORDER — TRAMADOL HCL 50 MG PO TABS: ORAL_TABLET | ORAL | Status: DC

## 2015-10-03 MED ORDER — GABAPENTIN 300 MG PO CAPS: ORAL_CAPSULE | ORAL | Status: DC

## 2015-10-03 NOTE — Patient Instructions (Addendum)
PLAN   Continue present medications and resume Neurontin and tramadol. Caution Neurontin and tramadol can cause respiratory depression, excessive sedation, confusion, and other side effects. Exercise extreme caution when taking these medications and stay with a responsible adult drive you can drive you to the emergency room or call EMS if you develop any of these symptoms CAUTION Mobic can elevate your blood pressure as well as cause damage to your liver and kidney and stomach please discuss Mobic with Dr. Venia Minks  Lumbar facet, medial branch nerve, blocks to be performed at time of return appointment  F/U PCP Dr. Venia Minks for evaliation of BP and general medical condition. Please follow-up with Dr. Carmin Richmond regarding your elevated blood pressure. Mentioned Mobic to Dr. Venia Minks which can also elevate your blood pressure    F/U surgical evaluation. Patient without plans for surgery at this time  F/U neurological evaluation. May consider [facility] EMG studies and other studies  F/U oncological evaluation. Patient will follow-up with Dr. Grayland Ormond as planned  May consider radiofrequency rhizolysis or intraspinal procedures pending response to present treatment and F/U evaluation   Patient to call [facility] should patient have concerns prior to scheduled return appointment Facet Blocks Patient Information  Description: The facets are joints in the spine between the vertebrae.  Like any joints in the body, facets can become irritated and painful.  Arthritis can also effect the facets.  By injecting steroids and local anesthetic in and around these joints, we can temporarily block the nerve supply to them.  Steroids act directly on irritated nerves and tissues to reduce selling and inflammation which often leads to decreased pain.  Facet blocks may be done anywhere along the spine from the neck to the low back depending upon the location of your pain.   After numbing the skin with local  anesthetic (like Novocaine), a small needle is passed onto the facet joints under x-ray guidance.  You may experience a sensation of pressure while this is being done.  The entire block usually lasts about 15-25 minutes.   Conditions which may be treated by facet blocks:   Low back/buttock pain  Neck/shoulder pain  Certain types of headaches  Preparation for the injection:  1. Do not eat any solid food or dairy products within 8 hours of your appointment. 2. You may drink clear liquid up to 3 hours before appointment.  Clear liquids include water, black coffee, juice or soda.  No milk or cream please. 3. You may take your regular medication, including pain medications, with a sip of water before your appointment.  Diabetics should hold regular insulin (if taken separately) and take 1/2 normal NPH dose the morning of the procedure.  Carry some sugar containing items with you to your appointment. 4. A driver must accompany you and be prepared to drive you home after your procedure. 5. Bring all your current medications with you. 6. An IV may be inserted and sedation may be given at the discretion of the physician. 7. A blood pressure cuff, EKG and other monitors will often be applied during the procedure.  Some patients may need to have extra oxygen administered for a short period. 8. You will be asked to provide medical information, including your allergies and medications, prior to the procedure.  We must know immediately if you are taking blood thinners (like Coumadin/Warfarin) or if you are allergic to IV iodine contrast (dye).  We must know if you could possible be pregnant.  Possible side-effects:  Bleeding from needle site  Infection (rare, may require surgery)  Nerve injury (rare)  Numbness & tingling (temporary)  Difficulty urinating (rare, temporary)  Spinal headache (a headache worse with upright posture)  Light-headedness (temporary)  Pain at injection site (serveral  days)  Decreased blood pressure (rare, temporary)  Weakness in arm/leg (temporary)  Pressure sensation in back/neck (temporary)   Call if you experience:   Fever/chills associated with headache or increased back/neck pain  Headache worsened by an upright position  New onset, weakness or numbness of an extremity below the injection site  Hives or difficulty breathing (go to the emergency room)  Inflammation or drainage at the injection site(s)  Severe back/neck pain greater than usual  New symptoms which are concerning to you  Please note:  Although the local anesthetic injected can often make your back or neck feel good for several hours after the injection, the pain will likely return. It takes 3-7 days for steroids to work.  You may not notice any pain relief for at least one week.  If effective, we will often do a series of 2-3 injections spaced 3-6 weeks apart to maximally decrease your pain.  After the initial series, you may be a candidate for a more permanent nerve block of the facets.  If you have any questions, please call #[phone]) [facility]

## 2015-10-03 NOTE — Progress Notes (Signed)
Subjective:    Patient ID: Carrie Alvarez, female    DOB: 1950-08-22, 65 y.o.   MRN: [id number]  HPI    The patient is a 65 year old female who returns to pain management for further evaluation and treatment of pain involving the lower back and lower extremity region predominantly. The patient has significant improvement of pain following lumbar facet, medial branch nerve blocks. The patient states that she is in hopes of being able to undergo the procedure again. We discussed patient's overall condition and will resume patient's Neurontin and tramadol.. We will consider patient for lumbar facet, medial branch nerve blocks to be performed at time return appointment. The patient stated that she was quite please with the amount of relief from the lumbar facet, medial branch nerve blocks. The patient stated that her pain which would aggravated by standing walking twisting turning decreased significantly. We will proceed with interventional treatment at time return appointment in attempt to decrease severity of symptoms, minimize progression of symptoms, and avoid the need for more involved treatment. All agreed to suggested treatment plan.   Review of Systems     Objective:   Physical Exam  There was tenderness to palpation of paraspinal muscular region the cervical region cervical facet region palpation which reproduces pain of mild degree with mild tenderness of the splenius capitis and occipitalis musculature regions. Palpation in the region of the cervical facet cervical paraspinal musculature region was attends to palpation of mild degree. There was mild tenderness of the acromioclavicular and glenohumeral joint regions. The patient appeared to be with unremarkable Spurling's maneuver. The patient appeared to be with slightly decreased grip strength with Tinel and Phalen's maneuver without significant increase of pain. Palpation over the lumbar paraspinal must reason lumbar facet region  was attends to palpation of moderate degree. Lateral bending rotation extension and palpation of the lumbar facets reproduce moderate discomfort on the left as well as on the right. There was mild to moderate tenderness of the PSIS and PII S region as well as the gluteal and piriformis musculature regions. There was minimal tenderness of the greater trochanteric region and iliotibial band region. Straight leg raising was tolerates approximately 30 without an increase of pain with dorsiflexion noted. There appeared to be negative clonus negative Homans. DTRs were difficult to elicit patient had difficulty relaxing. Reevaluation revealed patient to be with trace DTRs of the left and right knees. No sensory deficit or dermatomal distribution of the lower extremities noted. The abdomen was nontender with no costovertebral tenderness    Assessment & Plan:     Degenerative disc disease lumbar spine. Multilevel degenerative changes lumbar spine.  Lumbar facet syndrome  Lumbar stenosis  Sacroiliac joint dysfunction      PLAN   Continue present medications and resume Neurontin and tramadol. Caution Neurontin and tramadol can cause respiratory depression, excessive sedation, confusion, and other side effects. Exercise extreme caution when taking these medications and stay with a responsible adult drive you can drive you to the emergency room or call EMS if you develop any of these symptoms CAUTION Mobic can elevate your blood pressure as well as cause damage to your liver and kidney and stomach please discuss Mobic with Dr. Venia Minks  Lumbar facet, medial branch nerve, blocks to be performed at time of return appointment  F/U PCP Dr. Venia Minks for evaliation of BP and general medical condition. Please follow-up with Dr. Carmin Richmond regarding your elevated blood pressure. Mentioned Mobic to Dr. Venia Minks which can also elevate  your blood pressure    F/U surgical evaluation. Patient without plans for  surgery at this time  F/U neurological evaluation. May consider [facility] EMG studies and other studies  F/U oncological evaluation. Patient will follow-up with Dr. Grayland Ormond as planned  May consider radiofrequency rhizolysis or intraspinal procedures pending response to present treatment and F/U evaluation   Patient to call Pain Management Center should patient have concerns prior to scheduled return appointment

## 2015-10-03 NOTE — Progress Notes (Signed)
Safety precautions to be maintained throughout the outpatient stay will include: orient to surroundings, keep bed in low position, maintain call bell within reach at all times, provide assistance with transfer out of bed and ambulation.  

## 2015-10-16 ENCOUNTER — Encounter: Payer: Self-pay | Admitting: Pain Medicine

## 2015-10-16 ENCOUNTER — Ambulatory Visit: Payer: Medicare Other | Attending: Pain Medicine | Admitting: Pain Medicine

## 2015-10-16 VITALS — BP 165/95 | HR 69 | Temp 98.0°F | Resp 17 | Ht 63.0 in | Wt 180.0 lb

## 2015-10-16 DIAGNOSIS — M47816 Spondylosis without myelopathy or radiculopathy, lumbar region: Secondary | ICD-10-CM | POA: Diagnosis not present

## 2015-10-16 DIAGNOSIS — M545 Low back pain: Secondary | ICD-10-CM | POA: Diagnosis present

## 2015-10-16 DIAGNOSIS — M5136 Other intervertebral disc degeneration, lumbar region: Secondary | ICD-10-CM | POA: Diagnosis not present

## 2015-10-16 DIAGNOSIS — M47817 Spondylosis without myelopathy or radiculopathy, lumbosacral region: Secondary | ICD-10-CM

## 2015-10-16 DIAGNOSIS — M79606 Pain in leg, unspecified: Secondary | ICD-10-CM | POA: Diagnosis present

## 2015-10-16 DIAGNOSIS — M533 Sacrococcygeal disorders, not elsewhere classified: Secondary | ICD-10-CM

## 2015-10-16 DIAGNOSIS — M5137 Other intervertebral disc degeneration, lumbosacral region: Secondary | ICD-10-CM

## 2015-10-16 MED ORDER — BUPIVACAINE HCL (PF) 0.25 % IJ SOLN
INTRAMUSCULAR | Status: AC
  Administered 2015-10-16: 30 mL
  Filled 2015-10-16: qty 30

## 2015-10-16 MED ORDER — MIDAZOLAM HCL 5 MG/5ML IJ SOLN
INTRAMUSCULAR | Status: AC
  Administered 2015-10-16: 3 mg via INTRAVENOUS
  Filled 2015-10-16: qty 5

## 2015-10-16 MED ORDER — LIDOCAINE HCL (PF) 1 % IJ SOLN
INTRAMUSCULAR | Status: AC
  Filled 2015-10-16: qty 5

## 2015-10-16 MED ORDER — CEFAZOLIN SODIUM 1 G IJ SOLR
INTRAMUSCULAR | Status: AC
  Administered 2015-10-16: 1 g
  Filled 2015-10-16: qty 10

## 2015-10-16 MED ORDER — BUPIVACAINE HCL (PF) 0.25 % IJ SOLN
30.0000 mL | Freq: Once | INTRAMUSCULAR | Status: AC
  Administered 2015-10-16: 30 mL

## 2015-10-16 MED ORDER — TRIAMCINOLONE ACETONIDE 40 MG/ML IJ SUSP
INTRAMUSCULAR | Status: AC
  Administered 2015-10-16: 40 mg
  Filled 2015-10-16: qty 1

## 2015-10-16 MED ORDER — CEFUROXIME AXETIL 250 MG PO TABS: 250.0000 mg | ORAL_TABLET | Freq: Two times a day (BID) | ORAL | Status: DC

## 2015-10-16 MED ORDER — LIDOCAINE HCL (PF) 1 % IJ SOLN: 10.0000 mL | Freq: Once | INTRAMUSCULAR | Status: DC

## 2015-10-16 MED ORDER — LACTATED RINGERS IV SOLN: 1000.0000 mL | INTRAVENOUS | Status: DC

## 2015-10-16 MED ORDER — TRIAMCINOLONE ACETONIDE 40 MG/ML IJ SUSP
40.0000 mg | Freq: Once | INTRAMUSCULAR | Status: AC
  Administered 2015-10-16: 40 mg

## 2015-10-16 MED ORDER — ORPHENADRINE CITRATE 30 MG/ML IJ SOLN
60.0000 mg | Freq: Once | INTRAMUSCULAR | Status: AC
  Administered 2015-10-16: 60 mg via INTRAMUSCULAR

## 2015-10-16 MED ORDER — FENTANYL CITRATE (PF) 100 MCG/2ML IJ SOLN
INTRAMUSCULAR | Status: AC
  Administered 2015-10-16: 100 ug via INTRAVENOUS
  Filled 2015-10-16: qty 2

## 2015-10-16 MED ORDER — FENTANYL CITRATE (PF) 100 MCG/2ML IJ SOLN
100.0000 ug | Freq: Once | INTRAMUSCULAR | Status: AC
  Administered 2015-10-16: 100 ug via INTRAVENOUS

## 2015-10-16 MED ORDER — ORPHENADRINE CITRATE 30 MG/ML IJ SOLN
INTRAMUSCULAR | Status: AC
  Administered 2015-10-16: 60 mg via INTRAMUSCULAR
  Filled 2015-10-16: qty 2

## 2015-10-16 MED ORDER — MIDAZOLAM HCL 5 MG/5ML IJ SOLN
5.0000 mg | Freq: Once | INTRAMUSCULAR | Status: AC
  Administered 2015-10-16: 3 mg via INTRAVENOUS

## 2015-10-16 MED ORDER — CEFAZOLIN SODIUM 1-5 GM-% IV SOLN: 1.0000 g | Freq: Once | INTRAVENOUS | Status: DC

## 2015-10-16 NOTE — Progress Notes (Signed)
Subjective:    Patient ID: Carrie Alvarez, female    DOB: [birth year], 65 y.o.   MRN: [id number]  HPI  PROCEDURE PERFORMED: Lumbar facet (medial branch block)   NOTE: The patient is a 65 y.o. female who returns to [place] for further evaluation and treatment of pain involving the lumbar and lower extremity region. MRI  revealed the patient to be with evidence of degenerative disc disease lumbar spine. Multilevel degenerative changes lumbar spine... There is concern regarding significant component of patient's pain began due to lumbar facet syndrome The risks, benefits, and expectations of the procedure have been discussed and explained to the patient who was understanding and in agreement with suggested treatment plan. We will proceed with interventional treatment as discussed and as explained to the patient who was understanding and wished to proceed with procedure as planned.   DESCRIPTION OF PROCEDURE: Lumbar facet (medial branch block) with IV Versed, IV fentanyl conscious sedation, EKG, blood pressure, pulse, and pulse oximetry monitoring. The procedure was performed with the patient in the prone position. Betadine prep of proposed entry site performed.   NEEDLE PLACEMENT AT: Left L 2 lumbar facet (medial branch block). Under fluoroscopic guidance with oblique orientation of 15 degrees, a 22-gauge needle was inserted at the L 2 vertebral body level with needle placed at the targeted area of Burton's Eye or Eye of the [place] Dog with documentation of needle placement in the superior and lateral border of targeted area of Burton's Eye or Eye of the Scotty Dog with oblique orientation of 15 degrees. Following documentation of needle placement at the L 2 vertebral body level, needle placement was then accomplished at the L 3 vertebral body level.   NEEDLE PLACEMENT AT L3, L4, and L5 VERTEBRAL BODY LEVELS ON THE LEFT SIDE The procedure was performed at the L3, L4, and L5  vertebral body levels exactly as was performed at the L 2 vertebral body level utilizing the same technique and under fluoroscopic guidance.  NEEDLE PLACEMENT AT THE SACRAL ALA with AP view of the lumbosacral spine. With the patient in the prone position, Betadine prep of proposed entry site accomplished, a 22 gauge needle was inserted in the region of the sacral ala (groove formed by the superior articulating process of S1 and the sacral wing). Following documentation of needle placement at the sacral ala,   Needle placement was then verified at all levels on lateral view. Following documentation of needle placement at all levels on lateral view and following negative aspiration for heme and CSF, each level was injected with 1 mL of 0.25% bupivacaine with Kenalog.     LUMBAR FACET, MEDIAL BRANCH NERVE, BLOCKS PERFORMED ON THE RIGHT SIDE   The procedure was performed on the right side exactly as was performed on the left side at the same levels and utilizing the same technique under fluoroscopic guidance.   Myoneural block injections of the gluteal musculature region Following Betadine prep of proposed entry site a 22-gauge needle was inserted in the gluteal musculature region and following negative aspiration 1 cc of 0.25% bupivacaine with Norflex was injected for myoneural block injection of the gluteal musculature region times two.   The patient tolerated the procedure well    A total of 40 mg of Kenalog was utilized for the procedure.   PLAN:  1. Medications: The patient will continue presently prescribed medications Neurontin and tramadol 2. May consider modification of treatment regimen at time of return appointment pending response to  treatment rendered on today's visit. 3. The patient is to follow-up with primary care physician Dr. Venia Minks for further evaluation of blood pressure and general medical condition status post steroid injection performed on today's visit. The patient is  to follow-up with Dr. Venia Minks this week for further evaluation of blood pressure and general medical condition as discussed 4. Surgical follow-up evaluation. Has been addressed 5. Neurological follow-up evaluation. May consider [facility] EMG studies and other studies 6. F/U oncology as discussed 7. The patient may be candidate for radiofrequency procedures, implantation type procedures, and other treatment pending response to treatment and follow-up evaluation. 8. The patient has been advised to call the [facility] prior to scheduled return appointment should there be significant change in condition or should patient have other concerns regarding condition prior to scheduled return appointment.  The patient is understanding and in agreement with suggested treatment plan.   Review of Systems     Objective:   Physical Exam        Assessment & Plan:

## 2015-10-16 NOTE — Patient Instructions (Addendum)
PLAN   Continue present medication  and please obtain your antibiotic Ceftin and begin taking Ceftin antibiotic the day as prescribed  F/U PCP Dr. Venia Minks for evaliation of  BP and general medical  condition. Please see Dr. Venia Minks this week for evaluation of blood pressure as we discussed   F/U oncology  F/U surgical evaluation  F/U neurological evaluation  May consider radiofrequency rhizolysis or intraspinal procedures pending response to present treatment and F/U evaluation   Patient to call [facility] Management Center should patient have concerns prior to scheduled return appointmentFacet Joint Block The facet joints connect the bones of the spine (vertebrae). They make it possible for you to bend, twist, and make other movements with your spine. They also prevent you from overbending, overtwisting, and making other excessive movements.  A facet joint block is a procedure where a numbing medicine (anesthetic) is injected into a facet joint. Often, a type of anti-inflammatory medicine called a steroid is also injected. A facet joint block may be done for two reasons:   Diagnosis. A facet joint block may be done as a test to see whether neck or back pain is caused by a worn-down or infected facet joint. If the pain gets better after a facet joint block, it means the pain is probably coming from the facet joint. If the pain does not get better, it means the pain is probably not coming from the facet joint.   Therapy. A facet joint block may be done to relieve neck or back pain caused by a facet joint. A facet joint block is only done as a therapy if the pain does not improve with medicine, exercise programs, physical therapy, and other forms of pain management. LET [facility] CARE PROVIDER KNOW ABOUT:   Any allergies you have.   All medicines you are taking, including vitamins, herbs, eyedrops, and over-the-counter medicines and creams.   Previous problems you or members of your family  have had with the use of anesthetics.   Any blood disorders you have had.   Other health problems you have. RISKS AND COMPLICATIONS Generally, having a facet joint block is safe. However, as with any procedure, complications can occur. Possible complications associated with having a facet joint block include:   Bleeding.   Injury to a nerve near the injection site.   Pain at the injection site.   Weakness or numbness in areas controlled by nerves near the injection site.   Infection.   Temporary fluid retention.   Allergic reaction to anesthetics or medicines used during the procedure. BEFORE THE PROCEDURE   Follow your health care provider's instructions if you are taking dietary supplements or medicines. You may need to stop taking them or reduce your dosage.   Do not take any new dietary supplements or medicines without asking your health care provider first.   Follow your health care provider's instructions about eating and drinking before the procedure. You may need to stop eating and drinking several hours before the procedure.   Arrange to have an adult drive you home after the procedure. PROCEDURE  You may need to remove your clothing and dress in an open-back gown so that your health care provider can access your spine.   The procedure will be done while you are lying on an X-ray table. Most of the time you will be asked to lie on your stomach, but you may be asked to lie in a different position if an injection will be made in  your neck.   Special machines will be used to monitor your oxygen levels, heart rate, and blood pressure.   If an injection will be made in your neck, an intravenous (IV) tube will be inserted into one of your veins. Fluids and medicine will flow directly into your body through the IV tube.   The area over the facet joint where the injection will be made will be cleaned with an antiseptic soap. The surrounding skin will be covered  with sterile drapes.   An anesthetic will be applied to your skin to make the injection area numb. You may feel a temporary stinging or burning sensation.   A video X-ray machine will be used to locate the joint. A contrast dye may be injected into the facet joint area to help with locating the joint.   When the joint is located, an anesthetic medicine will be injected into the joint through the needle.   Your health care provider will ask you whether you feel pain relief. If you do feel relief, a steroid may be injected to provide pain relief for a longer period of time. If you do not feel relief or feel only partial relief, additional injections of an anesthetic may be made in other facet joints.   The needle will be removed, the skin will be cleansed, and bandages will be applied.  AFTER THE PROCEDURE   You will be observed for 15-30 minutes before being allowed to go home. Do not drive. Have an adult drive you or take a taxi or public transportation instead.   If you feel pain relief, the pain will return in several hours or days when the anesthetic wears off.   You may feel pain relief 2-14 days after the procedure. The amount of time this relief lasts varies from person to person.   It is normal to feel some tenderness over the injected area(s) for 2 days following the procedure.   If you have diabetes, you may have a temporary increase in blood sugar.   This information is not intended to replace advice given to you by your health care provider. Make sure you discuss any questions you have with your health care provider.   Document Released: 11/18/2006 Document Revised: 07/20/2014 Document Reviewed: 04/18/2012 [employer] Interactive Patient Education 2016 [employer] Pain Management Discharge Instructions  General Discharge Instructions :  If you need to reach your doctor call: Monday-Friday 8:00 am - 4:00 pm at [phone] or toll free [phone].  After clinic  hours [phone] to have operator reach doctor.  Bring all of your medication bottles to all your appointments in the pain clinic.  To cancel or reschedule your appointment with Pain Management please remember to call 24 hours in advance to avoid a fee.  Refer to the educational materials which you have been given on: General Risks, I had my Procedure. Discharge Instructions, Post Sedation.  Post Procedure Instructions:  The drugs you were given will stay in your system until tomorrow, so for the next 24 hours you should not drive, make any legal decisions or drink any alcoholic beverages.  You may eat anything you prefer, but it is better to start with liquids then soups and crackers, and gradually work up to solid foods.  Please notify your doctor immediately if you have any unusual bleeding, trouble breathing or pain that is not related to your normal pain.  Depending on the type of procedure that was done, some parts of your body may feel  week and/or numb.  This usually clears up by tonight or the next day.  Walk with the use of an assistive device or accompanied by an adult for the 24 hours.  You may use ice on the affected area for the first 24 hours.  Put ice in a Ziploc bag and cover with a towel and place against area 15 minutes on 15 minutes off.  You may switch to heat after 24 hours.

## 2015-10-16 NOTE — Progress Notes (Signed)
Safety precautions to be maintained throughout the outpatient stay will include: orient to surroundings, keep bed in low position, maintain call bell within reach at all times, provide assistance with transfer out of bed and ambulation.  

## 2015-10-17 ENCOUNTER — Telehealth: Payer: Self-pay | Admitting: *Deleted

## 2015-10-17 NOTE — Telephone Encounter (Signed)
No answer with post procedure follow-up call. 

## 2015-10-31 ENCOUNTER — Ambulatory Visit: Payer: Medicare Other | Attending: Pain Medicine | Admitting: Pain Medicine

## 2015-10-31 ENCOUNTER — Encounter: Payer: Self-pay | Admitting: Pain Medicine

## 2015-10-31 VITALS — BP 122/88 | HR 78 | Temp 98.5°F | Resp 15 | Ht 63.0 in | Wt 180.0 lb

## 2015-10-31 DIAGNOSIS — M47816 Spondylosis without myelopathy or radiculopathy, lumbar region: Secondary | ICD-10-CM | POA: Insufficient documentation

## 2015-10-31 DIAGNOSIS — M533 Sacrococcygeal disorders, not elsewhere classified: Secondary | ICD-10-CM | POA: Diagnosis not present

## 2015-10-31 DIAGNOSIS — M545 Low back pain: Secondary | ICD-10-CM | POA: Diagnosis not present

## 2015-10-31 DIAGNOSIS — M5416 Radiculopathy, lumbar region: Secondary | ICD-10-CM | POA: Diagnosis not present

## 2015-10-31 DIAGNOSIS — M4806 Spinal stenosis, lumbar region: Secondary | ICD-10-CM | POA: Diagnosis not present

## 2015-10-31 DIAGNOSIS — M62830 Muscle spasm of back: Secondary | ICD-10-CM | POA: Insufficient documentation

## 2015-10-31 DIAGNOSIS — G588 Other specified mononeuropathies: Secondary | ICD-10-CM

## 2015-10-31 DIAGNOSIS — M5136 Other intervertebral disc degeneration, lumbar region: Secondary | ICD-10-CM | POA: Diagnosis not present

## 2015-10-31 DIAGNOSIS — M791 Myalgia: Secondary | ICD-10-CM | POA: Diagnosis not present

## 2015-10-31 DIAGNOSIS — M47817 Spondylosis without myelopathy or radiculopathy, lumbosacral region: Secondary | ICD-10-CM

## 2015-10-31 DIAGNOSIS — M5137 Other intervertebral disc degeneration, lumbosacral region: Secondary | ICD-10-CM

## 2015-10-31 DIAGNOSIS — M51379 Other intervertebral disc degeneration, lumbosacral region without mention of lumbar back pain or lower extremity pain: Secondary | ICD-10-CM

## 2015-10-31 DIAGNOSIS — M51369 Other intervertebral disc degeneration, lumbar region without mention of lumbar back pain or lower extremity pain: Secondary | ICD-10-CM

## 2015-10-31 DIAGNOSIS — M79606 Pain in leg, unspecified: Secondary | ICD-10-CM | POA: Diagnosis present

## 2015-10-31 MED ORDER — ORPHENADRINE CITRATE ER 100 MG PO TB12: ORAL_TABLET | ORAL | Status: DC

## 2015-10-31 MED ORDER — TRAMADOL HCL 50 MG PO TABS: ORAL_TABLET | ORAL | Status: DC

## 2015-10-31 MED ORDER — GABAPENTIN 300 MG PO CAPS: ORAL_CAPSULE | ORAL | Status: DC

## 2015-10-31 NOTE — Patient Instructions (Addendum)
PLAN   Continue present medications  Neurontin and tramadol. Caution Neurontin and tramadol can cause respiratory depression, excessive sedation, confusion, and other side effects. Exercise extreme caution when taking these medications and stay with a responsible adult drive you can drive you to the emergency room or call EMS if you develop any of these symptoms CAUTION Mobic can elevate your blood pressure as well as cause damage to your liver and kidney and stomach please discuss Mobic with Dr. Tiana Loft NORFLEX for muscle spasms as discussed Caution Norflex can cause respiratory depression, confusion, and other side effects  Intercostal nerve blocks to be performed at time of return appointment  F/U PCP Dr. Venia Minks for evaliation of BP and general medical condition.    F/U surgical evaluation. Patient without plans for surgery at this time  F/U neurological evaluation. May consider [facility] EMG studies and other studies  F/U oncological evaluation. Patient will follow-up with Dr. Grayland Ormond as planned  May consider radiofrequency rhizolysis or intraspinal procedures pending response to present treatment and F/U evaluation   Patient to call Pain Management Center should patient have concerns prior to scheduled return appointmentPain Management Discharge Instructions  General Discharge Instructions :  If you need to reach your doctor call: Monday-Friday 8:00 am - 4:00 pm at [phone] or toll free [phone].  After clinic hours [phone] to have operator reach doctor.  Bring all of your medication bottles to all your appointments in the pain clinic.  To cancel or reschedule your appointment with Pain Management please remember to call 24 hours in advance to avoid a fee.  Refer to the educational materials which you have been given on: General Risks, I had my Procedure. Discharge Instructions, Post Sedation.  Post Procedure Instructions:  The drugs you were given will stay in  your system until tomorrow, so for the next 24 hours you should not drive, make any legal decisions or drink any alcoholic beverages.  You may eat anything you prefer, but it is better to start with liquids then soups and crackers, and gradually work up to solid foods.  Please notify your doctor immediately if you have any unusual bleeding, trouble breathing or pain that is not related to your normal pain.  Depending on the type of procedure that was done, some parts of your body may feel week and/or numb.  This usually clears up by tonight or the next day.  Walk with the use of an assistive device or accompanied by an adult for the 24 hours.  You may use ice on the affected area for the first 24 hours.  Put ice in a Ziploc bag and cover with a towel and place against area 15 minutes on 15 minutes off.  You may switch to heat after 24 hours.GENERAL RISKS AND COMPLICATIONS  What are the risk, side effects and possible complications? Generally speaking, most procedures are safe.  However, with any procedure there are risks, side effects, and the possibility of complications.  The risks and complications are dependent upon the sites that are lesioned, or the type of nerve block to be performed.  The closer the procedure is to the spine, the more serious the risks are.  Great care is taken when placing the radio frequency needles, block needles or lesioning probes, but sometimes complications can occur. 1. Infection: Any time there is an injection through the skin, there is a risk of infection.  This is why sterile conditions are used for these blocks.  There are four possible  types of infection. 1. Localized skin infection. 2. Central Nervous System Infection-This can be in the form of Meningitis, which can be deadly. 3. Epidural Infections-This can be in the form of an epidural abscess, which can cause pressure inside of the spine, causing compression of the spinal cord with subsequent paralysis. This  would require an emergency surgery to decompress, and there are no guarantees that the patient would recover from the paralysis. 4. Discitis-This is an infection of the intervertebral discs.  It occurs in about 1% of discography procedures.  It is difficult to treat and it may lead to surgery.        2. Pain: the needles have to go through skin and soft tissues, will cause soreness.       3. Damage to internal structures:  The nerves to be lesioned may be near blood vessels or    other nerves which can be potentially damaged.       4. Bleeding: Bleeding is more common if the patient is taking blood thinners such as  aspirin, Coumadin, Ticiid, Plavix, etc., or if he/she have some genetic predisposition  such as hemophilia. Bleeding into the spinal canal can cause compression of the spinal  cord with subsequent paralysis.  This would require an emergency surgery to  decompress and there are no guarantees that the patient would recover from the  paralysis.       5. Pneumothorax:  Puncturing of a lung is a possibility, every time a needle is introduced in  the area of the chest or upper back.  Pneumothorax refers to free air around the  collapsed lung(s), inside of the thoracic cavity (chest cavity).  Another two possible  complications related to a similar event would include: Hemothorax and Chylothorax.   These are variations of the Pneumothorax, where instead of air around the collapsed  lung(s), you may have blood or chyle, respectively.       6. Spinal headaches: They may occur with any procedures in the area of the spine.       7. Persistent CSF (Cerebro-Spinal Fluid) leakage: This is a rare problem, but may occur  with prolonged intrathecal or epidural catheters either due to the formation of a fistulous  track or a dural tear.       8. Nerve damage: By working so close to the spinal cord, there is always a possibility of  nerve damage, which could be as serious as a permanent spinal cord injury with   paralysis.       9. Death:  Although rare, severe deadly allergic reactions known as "Anaphylactic  reaction" can occur to any of the medications used.      10. Worsening of the symptoms:  We can always make thing worse.  What are the chances of something like this happening? Chances of any of this occuring are extremely low.  By statistics, you have more of a chance of getting killed in a motor vehicle accident: while driving to the hospital than any of the above occurring .  Nevertheless, you should be aware that they are possibilities.  In general, it is similar to taking a shower.  Everybody knows that you can slip, hit your head and get killed.  Does that mean that you should not shower again?  Nevertheless always keep in mind that statistics do not mean anything if you happen to be on the wrong side of them.  Even if a procedure has a 1 (one) in a  1,000,000 (million) chance of going wrong, it you happen to be that one..Also, keep in mind that by statistics, you have more of a chance of having something go wrong when taking medications.  Who should not have this procedure? If you are on a blood thinning medication (e.g. Coumadin, Plavix, see list of "Blood Thinners"), or if you have an active infection going on, you should not have the procedure.  If you are taking any blood thinners, please inform your physician.  How should I prepare for this procedure?  Do not eat or drink anything at least six hours prior to the procedure.  Bring a driver with you .  It cannot be a taxi.  Come accompanied by an adult that can drive you back, and that is strong enough to help you if your legs get weak or numb from the local anesthetic.  Take all of your medicines the morning of the procedure with just enough water to swallow them.  If you have diabetes, make sure that you are scheduled to have your procedure done first thing in the morning, whenever possible.  If you have diabetes, take only half of  your insulin dose and notify our nurse that you have done so as soon as you arrive at the clinic.  If you are diabetic, but only take blood sugar pills (oral hypoglycemic), then do not take them on the morning of your procedure.  You may take them after you have had the procedure.  Do not take aspirin or any aspirin-containing medications, at least eleven (11) days prior to the procedure.  They may prolong bleeding.  Wear loose fitting clothing that may be easy to take off and that you would not mind if it got stained with Betadine or blood.  Do not wear any jewelry or perfume  Remove any nail coloring.  It will interfere with some of our monitoring equipment.  NOTE: Remember that this is not meant to be interpreted as a complete list of all possible complications.  Unforeseen problems may occur.  BLOOD THINNERS The following drugs contain aspirin or other products, which can cause increased bleeding during surgery and should not be taken for 2 weeks prior to and 1 week after surgery.  If you should need take something for relief of minor pain, you may take acetaminophen which is found in Tylenol,m Datril, Anacin-3 and Panadol. It is not blood thinner. The products listed below are.  Do not take any of the products listed below in addition to any listed on your instruction sheet.  A.P.C or A.P.C with Codeine Codeine Phosphate Capsules #3 Ibuprofen Ridaura  ABC compound Congesprin Imuran rimadil  Advil Cope Indocin Robaxisal  Alka-Seltzer Effervescent Pain Reliever and Antacid Coricidin or Coricidin-D  Indomethacin Rufen  Alka-Seltzer plus Cold Medicine Cosprin Ketoprofen S-A-C Tablets  Anacin Analgesic Tablets or Capsules Coumadin Korlgesic Salflex  Anacin Extra Strength Analgesic tablets or capsules CP-2 Tablets Lanoril Salicylate  Anaprox Cuprimine Capsules Levenox Salocol  Anexsia-D Dalteparin Magan Salsalate  Anodynos Darvon compound Magnesium Salicylate Sine-off  Ansaid Dasin  Capsules Magsal Sodium Salicylate  Anturane Depen Capsules Marnal Soma  APF Arthritis pain formula Dewitt's Pills Measurin Stanback  Argesic Dia-Gesic Meclofenamic Sulfinpyrazone  Arthritis Bayer Timed Release Aspirin Diclofenac Meclomen Sulindac  Arthritis pain formula Anacin Dicumarol Medipren Supac  Analgesic (Safety coated) Arthralgen Diffunasal Mefanamic Suprofen  Arthritis Strength Bufferin Dihydrocodeine Mepro Compound Suprol  Arthropan liquid Dopirydamole Methcarbomol with Aspirin Synalgos  ASA tablets/Enseals Disalcid Micrainin Tagament  Ascriptin Doan's  Midol Talwin  Ascriptin A/D Dolene Mobidin Tanderil  Ascriptin Extra Strength Dolobid Moblgesic Ticlid  Ascriptin with Codeine Doloprin or Doloprin with Codeine Momentum Tolectin  Asperbuf Duoprin Mono-gesic Trendar  Aspergum Duradyne Motrin or Motrin IB Triminicin  Aspirin plain, buffered or enteric coated Durasal Myochrisine Trigesic  Aspirin Suppositories Easprin Nalfon Trillsate  Aspirin with Codeine Ecotrin Regular or Extra Strength Naprosyn Uracel  Atromid-S Efficin Naproxen Ursinus  Auranofin Capsules Elmiron Neocylate Vanquish  Axotal Emagrin Norgesic Verin  Azathioprine Empirin or Empirin with Codeine Normiflo Vitamin E  Azolid Emprazil Nuprin Voltaren  Bayer Aspirin plain, buffered or children's or timed BC Tablets or powders Encaprin Orgaran Warfarin Sodium  Buff-a-Comp Enoxaparin Orudis Zorpin  Buff-a-Comp with Codeine Equegesic Os-Cal-Gesic   Buffaprin Excedrin plain, buffered or Extra Strength Oxalid   Bufferin Arthritis Strength Feldene Oxphenbutazone   Bufferin plain or Extra Strength Feldene Capsules Oxycodone with Aspirin   Bufferin with Codeine Fenoprofen Fenoprofen Pabalate or Pabalate-SF   Buffets II Flogesic Panagesic   Buffinol plain or Extra Strength Florinal or Florinal with Codeine Panwarfarin   Buf-Tabs Flurbiprofen Penicillamine   Butalbital Compound Four-way cold tablets Penicillin    Butazolidin Fragmin Pepto-Bismol   Carbenicillin Geminisyn Percodan   Carna Arthritis Reliever Geopen Persantine   Carprofen Gold's salt Persistin   Chloramphenicol Goody's Phenylbutazone   Chloromycetin Haltrain Piroxlcam   Clmetidine heparin Plaquenil   Cllnoril Hyco-pap Ponstel   Clofibrate Hydroxy chloroquine Propoxyphen         Before stopping any of these medications, be sure to consult the physician who ordered them.  Some, such as Coumadin (Warfarin) are ordered to prevent or treat serious conditions such as "deep thrombosis", "pumonary embolisms", and other heart problems.  The amount of time that you may need off of the medication may also vary with the medication and the reason for which you were taking it.  If you are taking any of these medications, please make sure you notify your pain physician before you undergo any procedures.         Intercostal Nerve Block Patient Information  Description: The twelve intercostal nerves arise from the first thru twelfth thoracic nerve roots.  The nerve begins at the spine and wraps around the body, lying in a groove underneath each rib.  Each intercostal nerve innervates a specific strip of skin and body walk of the abdomen and chest.  Therefore, injuries of the chest wall or abdominal wall result in pain that is transmitted back to the brian via the intercostal nerves.  Examples of such injuries include rib fractures and incisions for lung and gall bladder surgery.  Occasionally, pain may persist long after an injury or surgical incision secondary to inflammation and irritation of the intercostal nerve.  The longstanding pain is known as intercostal neuralgia.  An intercostal nerve block is preformed to eliminate pain either temporarily or permanently.  A small needle is placed below the rib and local anesthetic (like Novocaine) and possibly steroid is injected.  Usually 2-4 intercostal nerves are blocked at a time depending on the  problem.  The patient will experience a slight "pin-prick" sensation for each injection.  Shortly thereafter, the strip of skin that is innervated by the blocked intercostal nerve will feel numb.  Persistent pain that is only temporarily relieved with local anesthetic may require a more permanent block. This procedure is called Cryoneurolysis and entails placing a small probe beneath the rib to freeze the nerve.  Conditions that may be treated by intercostal nerve  blocks:   Rib fractures  Longstanding pain from surgery of the chest or abdomen (intercostal neuralgia)  Pain from chest tubes  Pain from trauma to the chest  Preparation for the injections:  1. Do not eat any solid food or dairy products within 8 hours of your appointment. 2. You may drink clear liquids up to 3 hours before appointment.  Clear liquids include water, black coffee, juice or soda.  No milk or cream please. 3. You may take your regular medication, including pain medications, with a sip of water before your appointment.  Diabetics should hold regular insulin (if take separately) and take 1/2 normal NPH dose the morning of the procedure.   Carry some sugar containing items with you to your appointment. 4. A driver must accompany you and be prepared to drive you home after your procedure. 5. Bring all your current medications with you. 6. An IV may be inserted and sedation may be given at the discretion of the physician. 7. A blood pressure cuff, EKG and other monitors will often be applied during the procedure.  Some patients may need to have extra oxygen administered for a short period. 8. You will be asked to provide medical information, including your allergies, prior to the procedure.  We must know immediately if you are taking blood thinners (like Coumadin/Warfarin) or if you are allergic to IV iodine contrast (dye). We must know if you could possible be pregnant.  Possible side-effects:   Bleeding from needle  site  Infection (rare)  Nerve injury (rare)  Numbness & tingling of skin  Collapsed lung requiring chest tube (rare)  Local anesthetic toxicity (rare)  Light-headedness (temporary)  Pain at injection site (several days)  Decreased blood pressure (temporary)  Shortness of breath  Jittery/shaking sensation (temporary)  Call if you experience:   Difficulty breathing or hives (go directly to the emergency room)  Redness, inflammation or drainage at the injection site  Severe pain at the site of the injection  Any new symptoms which are concerning   Please note:  Your pain may subside immediately but may return several hours after the injection.  Often, more than one injection is required to reduce the pain. Also, if several temporary blocks with local anesthetic are ineffective, a more permanent block with cryolysis may be necessary.  This will be discussed with you should this be the case.  If you have any questions, please call [phone] [facility]

## 2015-10-31 NOTE — Progress Notes (Signed)
Safety precautions to be maintained throughout the outpatient stay will include: orient to surroundings, keep bed in low position, maintain call bell within reach at all times, provide assistance with transfer out of bed and ambulation.  

## 2015-10-31 NOTE — Progress Notes (Signed)
Subjective:    Patient ID: Carrie Alvarez, female    DOB: 1950/11/28, 65 y.o.   MRN: [id number]  HPI  The patient is a 65 year old female who returns to pain management for further evaluation and treatment of pain involving the mid lower back lower extremity region with severe spasms occurring mid back region and lower back region. The patient denies trauma change in events of daily living the call significant change in symptomatology. The patient states the pain in the mid back region associated with severe spasms the patient states the pain becomes more intense as the day progresses and interferes with ability to obtain restful sleep as well. The patient denies any trauma change in events of daily living to cause change in symptomatology. We will proceed with intercostal nerve blocks at time return appointment in attempt to decrease severe spasms of the thoracic region and will consider modifications of treatment regimen pending response to treatment and follow-up evaluation. The patient will continue tramadol and Neurontin and we will begin Norflex. We will proceed with intercostal nerve block in attempt to decrease severity of patient's symptoms, minimize progression of symptoms, and avoid need for more involved treatment. All agreed to suggested treatment plan.     Review of Systems     Objective:   Physical Exam  There was tenderness to palpation of paraspinal muscular treat and cervical region cervical facet region a moderate degree with moderate tenderness of the trapezius levator scapula and rhomboid musculature regions palpation which reproduces moderate to moderately severe discomfort and muscle spasms noted. Palpation of the acromioclavicular and glenohumeral joint regions reproduced moderate discomfort. Palpation of the splenius capitis and occipitalis musculature regions reproduce moderate to moderately severe discomfort. Palpation of the lumbar paraspinal musculatures and  lumbar facet region was increased pain of moderate to moderately severe degree with lateral bending rotation extension and palpation the lumbar facets reproducing moderate to moderately severe discomfort. Straight leg raise was tolerates approximately 20 without a definite increased pain with dorsiflexion noted. There was negative clonus negative Homans. DTRs were difficult to this patient had difficulty relaxing. Palpation over the PSIS and PII S region reproduced moderate discomfort. There was decreased EHL strength. Abdomen was nontender with no costovertebral tenderness noted.      Assessment & Plan:  Degenerative disc disease lumbar spine. Multilevel degenerative changes lumbar spine.  Lumbar facet syndrome  Lumbar stenosis  Sacroiliac joint dysfunction       PLAN   Continue present medications  Neurontin and tramadol. Caution Neurontin and tramadol can cause respiratory depression, excessive sedation, confusion, and other side effects. Exercise extreme caution when taking these medications and stay with a responsible adult drive you can drive you to the emergency room or call EMS if you develop any of these symptoms CAUTION Mobic can elevate your blood pressure as well as cause damage to your liver and kidney and stomach please discuss Mobic with Dr. Tiana Loft NORFLEX for muscle spasms as discussed Caution Norflex can cause respiratory depression, confusion, and other side effects  Intercostal nerve blocks to be performed at time of return appointment  F/U PCP Dr. Venia Minks for evaliation of BP and general medical condition.    F/U surgical evaluation. Patient without plans for surgery at this time  F/U neurological evaluation. May consider [facility] EMG studies and other studies  F/U oncological evaluation. Patient will follow-up with Dr. Grayland Ormond as planned  May consider radiofrequency rhizolysis or intraspinal procedures pending response to present treatment and F/U evaluation  Patient to call Pain Management Center should patient have concerns prior to scheduled return appointment

## 2015-11-06 ENCOUNTER — Ambulatory Visit: Payer: Medicare Other | Attending: Pain Medicine | Admitting: Pain Medicine

## 2015-11-06 ENCOUNTER — Encounter: Payer: Self-pay | Admitting: Pain Medicine

## 2015-11-06 VITALS — BP 150/85 | HR 72 | Temp 97.7°F | Resp 16 | Ht 63.0 in | Wt 180.0 lb

## 2015-11-06 DIAGNOSIS — M62830 Muscle spasm of back: Secondary | ICD-10-CM | POA: Insufficient documentation

## 2015-11-06 DIAGNOSIS — M47816 Spondylosis without myelopathy or radiculopathy, lumbar region: Secondary | ICD-10-CM | POA: Diagnosis not present

## 2015-11-06 DIAGNOSIS — G588 Other specified mononeuropathies: Secondary | ICD-10-CM

## 2015-11-06 DIAGNOSIS — M545 Low back pain: Secondary | ICD-10-CM | POA: Insufficient documentation

## 2015-11-06 DIAGNOSIS — M5137 Other intervertebral disc degeneration, lumbosacral region: Secondary | ICD-10-CM

## 2015-11-06 DIAGNOSIS — M5136 Other intervertebral disc degeneration, lumbar region: Secondary | ICD-10-CM

## 2015-11-06 DIAGNOSIS — M79606 Pain in leg, unspecified: Secondary | ICD-10-CM | POA: Diagnosis present

## 2015-11-06 DIAGNOSIS — M47817 Spondylosis without myelopathy or radiculopathy, lumbosacral region: Secondary | ICD-10-CM

## 2015-11-06 DIAGNOSIS — M533 Sacrococcygeal disorders, not elsewhere classified: Secondary | ICD-10-CM

## 2015-11-06 HISTORY — PX: OTHER SURGICAL HISTORY: SHX169

## 2015-11-06 MED ORDER — CEFAZOLIN SODIUM 1-5 GM-% IV SOLN: 1.0000 g | Freq: Once | INTRAVENOUS | Status: DC

## 2015-11-06 MED ORDER — FENTANYL CITRATE (PF) 100 MCG/2ML IJ SOLN
INTRAMUSCULAR | Status: AC
  Administered 2015-11-06: 100 ug via INTRAVENOUS
  Filled 2015-11-06: qty 2

## 2015-11-06 MED ORDER — MIDAZOLAM HCL 5 MG/5ML IJ SOLN: 5.0000 mg | Freq: Once | INTRAMUSCULAR | Status: DC

## 2015-11-06 MED ORDER — FENTANYL CITRATE (PF) 100 MCG/2ML IJ SOLN: 100.0000 ug | Freq: Once | INTRAMUSCULAR | Status: DC

## 2015-11-06 MED ORDER — TRIAMCINOLONE ACETONIDE 40 MG/ML IJ SUSP: 40.0000 mg | Freq: Once | INTRAMUSCULAR | Status: DC

## 2015-11-06 MED ORDER — ORPHENADRINE CITRATE 30 MG/ML IJ SOLN: 60.0000 mg | Freq: Once | INTRAMUSCULAR | Status: DC

## 2015-11-06 MED ORDER — MIDAZOLAM HCL 5 MG/5ML IJ SOLN
INTRAMUSCULAR | Status: AC
  Administered 2015-11-06: 14:00:00 via INTRAVENOUS
  Filled 2015-11-06: qty 5

## 2015-11-06 MED ORDER — BUPIVACAINE HCL (PF) 0.25 % IJ SOLN
INTRAMUSCULAR | Status: AC
  Administered 2015-11-06: 14:00:00
  Filled 2015-11-06: qty 30

## 2015-11-06 MED ORDER — TRIAMCINOLONE ACETONIDE 40 MG/ML IJ SUSP
INTRAMUSCULAR | Status: AC
Start: 2015-11-06 — End: 2015-11-06
  Administered 2015-11-06: 14:00:00
  Filled 2015-11-06: qty 1

## 2015-11-06 MED ORDER — LACTATED RINGERS IV SOLN: 1000.0000 mL | INTRAVENOUS | Status: DC

## 2015-11-06 MED ORDER — CEFUROXIME AXETIL 250 MG PO TABS: 250.0000 mg | ORAL_TABLET | Freq: Two times a day (BID) | ORAL | Status: DC

## 2015-11-06 MED ORDER — CEFAZOLIN SODIUM 1 G IJ SOLR
INTRAMUSCULAR | Status: AC
  Administered 2015-11-06: 1 g via INTRAVENOUS
  Filled 2015-11-06: qty 10

## 2015-11-06 MED ORDER — ORPHENADRINE CITRATE 30 MG/ML IJ SOLN
INTRAMUSCULAR | Status: AC
  Administered 2015-11-06: 14:00:00
  Filled 2015-11-06: qty 2

## 2015-11-06 MED ORDER — BUPIVACAINE HCL (PF) 0.25 % IJ SOLN: 30.0000 mL | Freq: Once | INTRAMUSCULAR | Status: DC

## 2015-11-06 NOTE — Progress Notes (Signed)
Subjective:    Patient ID: Carrie Alvarez, female    DOB: April 09, 1951, 65 y.o.   MRN: [id number]  HPI  PROCEDURE PERFORMED: Lumbar facet (medial branch block)   NOTE: The patient is a 65 y.o. female who returns to [facility] for further evaluation and treatment of pain involving the lumbar and lower extremity region. MRI  revealed the patient to be with evidence of multilevel degenerative changes of the lumbar spine with concern regarding significant component of patient's pain being due to lumbar facet syndrome. The patient also has had spasms involving the thoracic and lumbar region of significant degree and that has been concern regarding component of intercostal neuralgia contributing to patient's symptomatology with muscle spasms of the thoracic region of significant degree. On today's visit the patient complained of severely disabling lower back pain and decision was made to proceed with lumbar facet, medial branch nerve blocks instead of intercostal nerve blocks. The patient was with understanding and in agreement with suggested treatment plan. The risks, benefits, and expectations of the procedure have been discussed and explained to the patient who was understanding and in agreement with suggested treatment plan to proceed with lumbar facet, medial branch nerve blocks on today's visit instead of intercostal nerve blocks. We will proceed with lumbar facet, medial branch nerve, blocks as discussed and as explained to the patient who was understanding and wished to proceed with the procedure as discussed on today's visit.  DESCRIPTION OF PROCEDURE: Lumbar facet (medial branch block) with IV Versed, IV fentanyl conscious sedation, EKG, blood pressure, pulse, capnography, and pulse oximetry monitoring. The procedure was performed with the patient in the prone position. Betadine prep of proposed entry site performed.   NEEDLE PLACEMENT AT: Left L 2 lumbar facet (medial branch  block). Under fluoroscopic guidance with oblique orientation of 15 degrees, a 22-gauge needle was inserted at the L 2 vertebral body level with needle placed at the targeted area of Burton's Eye or Eye of the Scotty Dog with documentation of needle placement in the superior and lateral border of targeted area of Burton's Eye or Eye of the Scotty Dog with oblique orientation of 15 degrees. Following documentation of needle placement at the L 2 vertebral body level, needle placement was then accomplished at the L 3 vertebral body level.   NEEDLE PLACEMENT AT L3, L4, and L5 VERTEBRAL BODY LEVELS ON THE LEFT SIDE The procedure was performed at the L3, L4, and L5 vertebral body levels exactly as was performed at the L 2 vertebral body level utilizing the same technique and under fluoroscopic guidance.  NEEDLE PLACEMENT AT THE SACRAL ALA with AP view of the lumbosacral spine. With the patient in the prone position, Betadine prep of proposed entry site accomplished, a 22 gauge needle was inserted in the region of the sacral ala (groove formed by the superior articulating process of S1 and the sacral wing). Following documentation of needle placement at the sacral ala,  needle placement was then accomplished at the S1 foramen level.     Needle placement was then verified at all levels on lateral view. Following documentation of needle placement at all levels on lateral view and following negative aspiration for heme and CSF, each level was injected with 1 mL of 0.25% bupivacaine with Kenalog.     LUMBAR FACET, MEDIAL BRANCH NERVE, BLOCKS PERFORMED ON THE RIGHT SIDE   The procedure was performed on the right side exactly as was performed on the left side at the  same levels and utilizing the same technique under fluoroscopic guidance.     The patient tolerated the procedure well. A total of 40 mg of Kenalog was utilized for the procedure.   PLAN:  1. Medications: The patient will continue presently  prescribed medications Neurontin and tramadol 2. May consider modification of treatment regimen at time of return appointment pending response to treatment rendered on today's visit. 3. The patient is to follow-up with primary care physician Dr. Venia Minks for further evaluation of blood pressure and general medical condition status post steroid injection performed on today's visit.. We call the office of Dr. Venia Minks to schedule patient for evaluation for this week for evaluation of blood pressure and general medical condition 4. Surgical follow-up evaluation. Has been addressed 5. Neurological follow-up evaluation. May consider PNCV EMG studies and other studies 6. The patient may be candidate for radiofrequency procedures, implantation type procedures, and other treatment pending response to treatment and follow-up evaluation. 7. The patient has been advised to call the Pain Management Center prior to scheduled return appointment should there be significant change in condition or should patient have other concerns regarding condition prior to scheduled return appointment.  The patient is understanding and in agreement with suggested treatment plan.   Review of Systems     Objective:   Physical Exam        Assessment & Plan:

## 2015-11-06 NOTE — Progress Notes (Signed)
Safety precautions to be maintained throughout the outpatient stay will include: orient to surroundings, keep bed in low position, maintain call bell within reach at all times, provide assistance with transfer out of bed and ambulation.  

## 2015-11-06 NOTE — Patient Instructions (Addendum)
PLAN   Continue present medication  and please obtain your antibiotic Ceftin and begin taking Ceftin antibiotic the day as prescribed  F/U PCP Dr. Venia Minks for evaliation of  BP and general medical  condition.. Please see Dr. Venia Minks this week for evaluation of blood pressure as we discussed   F/U oncology  F/U surgical evaluation  F/U neurological evaluation  May consider radiofrequency rhizolysis or intraspinal procedures pending response to present treatment and F/U evaluation   Patient to call Pain Management Center should patient have concerns prior to scheduled return appointmentPain Management Discharge Instructions  General Discharge Instructions :  If you need to reach your doctor call: Monday-Friday 8:00 am - 4:00 pm at [phone] or toll free [phone].  After clinic hours [phone] to have operator reach doctor.  Bring all of your medication bottles to all your appointments in the pain clinic.  To cancel or reschedule your appointment with Pain Management please remember to call 24 hours in advance to avoid a fee.  Refer to the educational materials which you have been given on: General Risks, I had my Procedure. Discharge Instructions, Post Sedation.  Post Procedure Instructions:  The drugs you were given will stay in your system until tomorrow, so for the next 24 hours you should not drive, make any legal decisions or drink any alcoholic beverages.  You may eat anything you prefer, but it is better to start with liquids then soups and crackers, and gradually work up to solid foods.  Please notify your doctor immediately if you have any unusual bleeding, trouble breathing or pain that is not related to your normal pain.  Depending on the type of procedure that was done, some parts of your body may feel week and/or numb.  This usually clears up by tonight or the next day.  Walk with the use of an assistive device or accompanied by an adult for the 24  hours.  You may use ice on the affected area for the first 24 hours.  Put ice in a Ziploc bag and cover with a towel and place against area 15 minutes on 15 minutes off.  You may switch to heat after 24 hours.

## 2015-11-07 ENCOUNTER — Telehealth: Payer: Self-pay | Admitting: *Deleted

## 2015-11-07 LAB — TOXASSURE SELECT 13 (MW), URINE: PDF: 0

## 2015-11-07 NOTE — Telephone Encounter (Signed)
Left message

## 2015-11-07 NOTE — Progress Notes (Signed)
Quick Note:  Reviewed. ______ 

## 2015-11-08 ENCOUNTER — Inpatient Hospital Stay: Payer: Medicare Other | Attending: Oncology

## 2015-11-08 DIAGNOSIS — Z170 Estrogen receptor positive status [ER+]: Secondary | ICD-10-CM | POA: Insufficient documentation

## 2015-11-08 DIAGNOSIS — C773 Secondary and unspecified malignant neoplasm of axilla and upper limb lymph nodes: Secondary | ICD-10-CM | POA: Diagnosis not present

## 2015-11-08 DIAGNOSIS — Z9221 Personal history of antineoplastic chemotherapy: Secondary | ICD-10-CM | POA: Insufficient documentation

## 2015-11-08 DIAGNOSIS — C50919 Malignant neoplasm of unspecified site of unspecified female breast: Secondary | ICD-10-CM

## 2015-11-08 DIAGNOSIS — C50912 Malignant neoplasm of unspecified site of left female breast: Secondary | ICD-10-CM | POA: Insufficient documentation

## 2015-11-08 DIAGNOSIS — Z452 Encounter for adjustment and management of vascular access device: Secondary | ICD-10-CM | POA: Insufficient documentation

## 2015-11-08 DIAGNOSIS — Z79811 Long term (current) use of aromatase inhibitors: Secondary | ICD-10-CM | POA: Insufficient documentation

## 2015-11-08 MED ORDER — HEPARIN SOD (PORK) LOCK FLUSH 100 UNIT/ML IV SOLN
500.0000 [IU] | Freq: Once | INTRAVENOUS | Status: AC
  Administered 2015-11-08: 500 [IU] via INTRAVENOUS
  Filled 2015-11-08: qty 5

## 2015-11-08 MED ORDER — SODIUM CHLORIDE 0.9% FLUSH
10.0000 mL | Freq: Once | INTRAVENOUS | Status: AC
  Administered 2015-11-08: 10 mL via INTRAVENOUS
  Filled 2015-11-08: qty 10

## 2015-11-25 ENCOUNTER — Telehealth: Payer: Self-pay

## 2015-11-25 NOTE — Telephone Encounter (Signed)
Called pt to schedule appointment for back brace need. Appointment scheduled for 12/02/2015 at 0800. Renaldo Fiddler, CMA

## 2015-11-28 ENCOUNTER — Ambulatory Visit: Payer: Medicare Other | Attending: Pain Medicine | Admitting: Pain Medicine

## 2015-11-28 ENCOUNTER — Encounter: Payer: Self-pay | Admitting: Pain Medicine

## 2015-11-28 VITALS — BP 116/81 | HR 86 | Temp 98.5°F | Resp 16 | Ht 63.0 in | Wt 180.0 lb

## 2015-11-28 DIAGNOSIS — R531 Weakness: Secondary | ICD-10-CM | POA: Diagnosis not present

## 2015-11-28 DIAGNOSIS — M791 Myalgia: Secondary | ICD-10-CM | POA: Diagnosis not present

## 2015-11-28 DIAGNOSIS — M533 Sacrococcygeal disorders, not elsewhere classified: Secondary | ICD-10-CM

## 2015-11-28 DIAGNOSIS — M79606 Pain in leg, unspecified: Secondary | ICD-10-CM | POA: Diagnosis present

## 2015-11-28 DIAGNOSIS — M47816 Spondylosis without myelopathy or radiculopathy, lumbar region: Secondary | ICD-10-CM | POA: Insufficient documentation

## 2015-11-28 DIAGNOSIS — M47817 Spondylosis without myelopathy or radiculopathy, lumbosacral region: Secondary | ICD-10-CM | POA: Diagnosis not present

## 2015-11-28 DIAGNOSIS — M4806 Spinal stenosis, lumbar region: Secondary | ICD-10-CM | POA: Insufficient documentation

## 2015-11-28 DIAGNOSIS — M5137 Other intervertebral disc degeneration, lumbosacral region: Secondary | ICD-10-CM

## 2015-11-28 DIAGNOSIS — G588 Other specified mononeuropathies: Secondary | ICD-10-CM

## 2015-11-28 DIAGNOSIS — M5136 Other intervertebral disc degeneration, lumbar region: Secondary | ICD-10-CM | POA: Diagnosis not present

## 2015-11-28 DIAGNOSIS — M5416 Radiculopathy, lumbar region: Secondary | ICD-10-CM | POA: Diagnosis not present

## 2015-11-28 DIAGNOSIS — M545 Low back pain: Secondary | ICD-10-CM | POA: Diagnosis present

## 2015-11-28 MED ORDER — TRAMADOL HCL 50 MG PO TABS: ORAL_TABLET | ORAL | Status: DC

## 2015-11-28 MED ORDER — GABAPENTIN 300 MG PO CAPS
ORAL_CAPSULE | ORAL | Status: DC
Start: 2015-11-28 — End: 2015-12-30

## 2015-11-28 MED ORDER — ORPHENADRINE CITRATE ER 100 MG PO TB12: ORAL_TABLET | ORAL | Status: DC

## 2015-11-28 NOTE — Patient Instructions (Addendum)
PLAN   Continue present medications  Neurontin and tramadol. CAUTION Mobic can elevate your blood pressure as well as cause damage to your liver and kidney and stomach please discuss Mobic with Dr. Tiana Loft NORFLEX for muscle spasms as discussed Caution Norflex can cause respiratory depression, confusion, and other side effects  F/U PCP Dr. Venia Minks for evaliation of BP and general medical condition.    F/U surgical evaluation. Patient without plans for surgery at this time  F/U neurological evaluation. May consider PNCV EMG studies and other studies  F/U oncological evaluation. Patient will follow-up with Dr. Grayland Ormond as planned  May consider radiofrequency rhizolysis or intraspinal procedures pending response to present treatment and F/U evaluation   Patient to call Pain Management Center should patient have concerns prior to scheduled return appointmentEpidural Steroid Injection Patient Information  Description: The epidural space surrounds the nerves as they exit the spinal cord.  In some patients, the nerves can be compressed and inflamed by a bulging disc or a tight spinal canal (spinal stenosis).  By injecting steroids into the epidural space, we can bring irritated nerves into direct contact with a potentially helpful medication.  These steroids act directly on the irritated nerves and can reduce swelling and inflammation which often leads to decreased pain.  Epidural steroids may be injected anywhere along the spine and from the neck to the low back depending upon the location of your pain.   After numbing the skin with local anesthetic (like Novocaine), a small needle is passed into the epidural space slowly.  You may experience a sensation of pressure while this is being done.  The entire block usually last less than 10 minutes.  Conditions which may be treated by epidural steroids:   Low back and leg pain  Neck and arm pain  Spinal stenosis  Post-laminectomy  syndrome  Herpes zoster (shingles) pain  Pain from compression fractures  Preparation for the injection:  1. Do not eat any solid food or dairy products within 8 hours of your appointment.  2. You may drink clear liquids up to 3 hours before appointment.  Clear liquids include water, black coffee, juice or soda.  No milk or cream please. 3. You may take your regular medication, including pain medications, with a sip of water before your appointment  Diabetics should hold regular insulin (if taken separately) and take 1/2 normal NPH dos the morning of the procedure.  Carry some sugar containing items with you to your appointment. 4. A driver must accompany you and be prepared to drive you home after your procedure.  5. Bring all your current medications with your. 6. An IV may be inserted and sedation may be given at the discretion of the physician.   7. A blood pressure cuff, EKG and other monitors will often be applied during the procedure.  Some patients may need to have extra oxygen administered for a short period. 8. You will be asked to provide medical information, including your allergies, prior to the procedure.  We must know immediately if you are taking blood thinners (like Coumadin/Warfarin)  Or if you are allergic to IV iodine contrast (dye). We must know if you could possible be pregnant.  Possible side-effects:  Bleeding from needle site  Infection (rare, may require surgery)  Nerve injury (rare)  Numbness & tingling (temporary)  Difficulty urinating (rare, temporary)  Spinal headache ( a headache worse with upright posture)  Light -headedness (temporary)  Pain at injection site (several days)  Decreased  blood pressure (temporary)  Weakness in arm/leg (temporary)  Pressure sensation in back/neck (temporary)  Call if you experience:  Fever/chills associated with headache or increased back/neck pain.  Headache worsened by an upright position.  New onset  weakness or numbness of an extremity below the injection site  Hives or difficulty breathing (go to the emergency room)  Inflammation or drainage at the infection site  Severe back/neck pain  Any new symptoms which are concerning to you  Please note:  Although the local anesthetic injected can often make your back or neck feel good for several hours after the injection, the pain will likely return.  It takes 3-7 days for steroids to work in the epidural space.  You may not notice any pain relief for at least that one week.  If effective, we will often do a series of three injections spaced 3-6 weeks apart to maximally decrease your pain.  After the initial series, we generally will wait several months before considering a repeat injection of the same type.  If you have any questions, please call [phone] [facility]

## 2015-11-28 NOTE — Progress Notes (Signed)
Safety precautions to be maintained throughout the outpatient stay will include: orient to surroundings, keep bed in low position, maintain call bell within reach at all times, provide assistance with transfer out of bed and ambulation.  

## 2015-11-28 NOTE — Progress Notes (Signed)
Subjective:    Patient ID: Carrie Alvarez, female    DOB: [birth year], 66 y.o.   MRN: [id number]  HPI   The patient is a 65 year old female who returns to pain management for further evaluation and treatment of pain involving the lower back and lower extremity region. The patient states that the pain of the lower back region and increases as the day progresses and patient spends more time on the feet. The patient states the pain is associated with lower extremity weakness as well. We discussed patient's condition and patient admits to significant muscle spasms contributing to symptoms of the mid back and lower back region is well. Decision was made to begin Norflex palpation continues tramadol and Neurontin. The patient was cautioned regarding respiratory depression, excessive sedation, confusion and other side effects which can occur with medications. The patient expressed willingness to comply to avoid undesirable side effects. And decision was made to proceed with lumbar epidural steroid injection at time return appointment in attempt to decrease appear lower back lower extremity pain paresthesias and weakness. All agreed to suggested treatment plan        Review of Systems     Objective:   Physical Exam   There was tenderness to palpation of paraspinal must reason cervical region cervical facet region a mild degree with mild tenderness of the splenius capitis and occipitalis musculature region. Palpation of the thoracic facet thoracic paraspinal musculature region was attends to palpation of mild degree palpation of the cervical facet cervical paraspinal muscles reproduce mild discomfort as well. There appeared to be unremarkable Spurling's maneuver and patient appeared to be with slightly decreased grip strength. Tinel and Phalen's maneuver reproducing minimal discomfort. Palpation over the thoracic region thoracic facet region was attends to palpation of mild degree with no  crepitus of the thoracic region noted. Palpation over the lumbar paraspinal muscular treat and lumbar facet region was associated tends to palpation of moderate degree with lateral bending rotation extension and palpation over the lumbar facets reproducing moderate discomfort. Straight leg raise was tolerated to approximately 20 without increased pain with dorsiflexion noted. EHL strength appeared to be decreased There was tenderness to palpation of the knees with negative anterior and posterior drawer signs without ballottement of the patella. Palpation over the PSIS and PII S region reproduces moderate discomfort. There was mild tenderness along the greater trochanteric region iliotibial band region area of definite sensory deficit of dermatomal distribution detected. There was negative clonus negative Homans. Abdomen nontender with no costovertebral tenderness noted     Assessment & Plan:     Degenerative disc disease lumbar spine. Multilevel degenerative changes lumbar spine.  Lumbar facet syndrome  Lumbar stenosis      PLAN   Continue present medications  Neurontin and tramadol. CAUTION Mobic can elevate your blood pressure as well as cause damage to your liver and kidney and stomach please discuss Mobic with Dr. Tiana Loft NORFLEX for muscle spasms as discussed Caution Norflex can cause respiratory depression, confusion, and other side effects  F/U PCP Dr. Venia Minks for evaliation of BP and general medical condition.    F/U surgical evaluation. Patient without plans for surgery at this time  F/U neurological evaluation. May consider PNCV EMG studies and other studies  F/U oncological evaluation. Patient will follow-up with Dr. Grayland Ormond as planned  May consider radiofrequency rhizolysis or intraspinal procedures pending response to present treatment and F/U evaluation  Patient to call Pain Management Center should patient have concerns prior to scheduled  return  appointment

## 2015-11-29 ENCOUNTER — Other Ambulatory Visit: Payer: Self-pay | Admitting: Family Medicine

## 2015-11-29 DIAGNOSIS — F329 Major depressive disorder, single episode, unspecified: Secondary | ICD-10-CM

## 2015-11-29 DIAGNOSIS — F32A Depression, unspecified: Secondary | ICD-10-CM

## 2015-12-02 ENCOUNTER — Encounter: Payer: Self-pay | Admitting: Family Medicine

## 2015-12-02 ENCOUNTER — Ambulatory Visit (INDEPENDENT_AMBULATORY_CARE_PROVIDER_SITE_OTHER): Payer: Medicare Other | Admitting: Family Medicine

## 2015-12-02 VITALS — BP 120/88 | HR 80 | Temp 98.3°F | Resp 16 | Ht 63.0 in | Wt 184.0 lb

## 2015-12-02 DIAGNOSIS — E7800 Pure hypercholesterolemia, unspecified: Secondary | ICD-10-CM

## 2015-12-02 DIAGNOSIS — E559 Vitamin D deficiency, unspecified: Secondary | ICD-10-CM | POA: Diagnosis not present

## 2015-12-02 DIAGNOSIS — M51379 Other intervertebral disc degeneration, lumbosacral region without mention of lumbar back pain or lower extremity pain: Secondary | ICD-10-CM

## 2015-12-02 DIAGNOSIS — T7840XA Allergy, unspecified, initial encounter: Secondary | ICD-10-CM

## 2015-12-02 DIAGNOSIS — M5137 Other intervertebral disc degeneration, lumbosacral region: Secondary | ICD-10-CM

## 2015-12-02 NOTE — Progress Notes (Signed)
Patient ID: Carrie Alvarez, female   DOB: Feb 03, 1951, 65 y.o.   MRN: [id number]       Patient: Carrie Alvarez Female    DOB: Dec 28, 1950   65 y.o.   MRN: [id number] Visit Date: 12/02/2015  Today's Provider: Margarita Rana, MD   Chief Complaint  Patient presents with  . Back Pain   Subjective:    HPI Back Pain follow-up: Patient presents for follow-up of low back problems.  Symptoms have been present for 2- 3 years and include numbness in bilateral lower, pain in lower bilateral (aching in character; 7/10 in severity), paresthesias in both legs, stiffness in back and weakness in leg. Initial inciting event: none. Symptoms are worst: morning, nighttime. Alleviating factors identifiable by patient are medication injection. Exacerbating factors identifiable by patient are standing for any number of minutes. Treatments so far initiated by patient: seen by pain clinic Previous lower back problems: none. Previous treatments: patient had medial branch block done on 11-06-2015. Patient is been seen at pain clinic twice a month by Dr. Primus Bravo.   Also has new problem. Multiple red lesion on arms, leg and chest. Itchy. Did take Benadryl and it helped some. No known exposure to bugs. Has been outside some, but does not recall being bitten.      No Known Allergies Previous Medications   AMLODIPINE (NORVASC) 2.5 MG TABLET    TAKE ONE TABLET BY MOUTH ONCE DAILY   FLUTICASONE (FLONASE) 50 MCG/ACT NASAL SPRAY    Place 1 spray into both nostrils as needed for allergies or rhinitis.   GABAPENTIN (NEURONTIN) 300 MG CAPSULE    Limit 1 tablet by mouth 2-4 times per day if tolerated   LETROZOLE (FEMARA) 2.5 MG TABLET    Take 1 tablet (2.5 mg total) by mouth daily.   LIDOCAINE-PRILOCAINE (EMLA) CREAM    Apply 1 application topically as needed. Apply to port then cover with saran wrap 1-2 hours prior to chemotherapy appointment   LORATADINE (CLARITIN) 10 MG TABLET    Take 10 mg by mouth daily.   MECLIZINE (ANTIVERT) 12.5 MG TABLET    Take 1 tablet (12.5 mg total) by mouth every 6 (six) hours as needed for dizziness or nausea.   MELOXICAM (MOBIC) 15 MG TABLET    TAKE ONE TABLET BY MOUTH ONCE DAILY   ORPHENADRINE (NORFLEX) 100 MG TABLET    Limit 1 tablet by mouth per day or twice per day if tolerated   ORPHENADRINE (NORFLEX) 100 MG TABLET    Limit 1 pill by mouth per day or twice per day if tolerated   PYRIDOXINE (VITAMIN B-6) 100 MG TABLET    Take 100 mg by mouth daily.   SERTRALINE (ZOLOFT) 50 MG TABLET    TAKE FOUR TABLETS BY MOUTH AT BEDTIME   TRAMADOL (ULTRAM) 50 MG TABLET    Limit 1 tab by mouth 2-4 times per day if tolerated   VITAMIN B-12 (CYANOCOBALAMIN) 500 MCG TABLET    Take 500 mcg by mouth daily.    Review of Systems  Constitutional: Negative.   Cardiovascular: Negative.   Musculoskeletal: Positive for back pain.  Skin: Positive for rash.    Social History  Substance Use Topics  . Smoking status: Former Smoker -- 0.50 packs/day    Types: Cigarettes    Quit date: 07/18/2014  . Smokeless tobacco: Never Used     Comment: given smoking and pain information   . Alcohol Use: No   Objective:   BP 120/88 mmHg  Pulse 80  Temp(Src) 98.3 F (36.8 C) (Oral)  Resp 16  Ht 5\' 3"  (1.6 m)  Wt 184 lb (83.462 kg)  BMI 32.60 kg/m2  Physical Exam  Constitutional: She is oriented to person, place, and time. She appears well-developed and well-nourished.  Cardiovascular: Normal rate, regular rhythm and normal heart sounds.   Pulmonary/Chest: Effort normal and breath sounds normal.  Neurological: She is alert and oriented to person, place, and time.  Skin: Rash noted.        Assessment & Plan:     1. DDD (degenerative disc disease), lumbosacral Stable. Patient advised to continue follow-up with Dr. Primus Bravo.  Does not want back brace.    2. Avitaminosis D Patient advised to call in 2-3 weeks for lab order.  3. Hypercholesteremia As above.  4. Allergic reaction,  initial encounter New problem. Patient advised to start OTC Zyrtec 10 mg daily for rash. Patient instructed to call back if condition worsens or does not improve and will refer to dermatology.      Patient seen and examined by Dr. Jerrell Belfast, and note scribed by Philbert Riser. Dimas, CMA.  I have reviewed the document for accuracy and completeness and I agree with above. - Jerrell Belfast, MD   Margarita Rana, MD  [facility]

## 2015-12-02 NOTE — Patient Instructions (Addendum)
Please start Zyrtec 10 mg daily for rash. Please call in 2-3 weeks for labs.

## 2015-12-11 ENCOUNTER — Ambulatory Visit: Payer: Medicare Other | Attending: Pain Medicine | Admitting: Pain Medicine

## 2015-12-11 ENCOUNTER — Encounter: Payer: Self-pay | Admitting: Pain Medicine

## 2015-12-11 VITALS — BP 160/93 | HR 72 | Temp 97.0°F | Resp 13 | Ht 63.0 in | Wt 180.0 lb

## 2015-12-11 DIAGNOSIS — M5136 Other intervertebral disc degeneration, lumbar region: Secondary | ICD-10-CM

## 2015-12-11 DIAGNOSIS — G588 Other specified mononeuropathies: Secondary | ICD-10-CM

## 2015-12-11 DIAGNOSIS — M5416 Radiculopathy, lumbar region: Secondary | ICD-10-CM | POA: Diagnosis not present

## 2015-12-11 DIAGNOSIS — M545 Low back pain: Secondary | ICD-10-CM | POA: Diagnosis present

## 2015-12-11 DIAGNOSIS — M79606 Pain in leg, unspecified: Secondary | ICD-10-CM | POA: Diagnosis present

## 2015-12-11 DIAGNOSIS — M47817 Spondylosis without myelopathy or radiculopathy, lumbosacral region: Secondary | ICD-10-CM

## 2015-12-11 DIAGNOSIS — M47816 Spondylosis without myelopathy or radiculopathy, lumbar region: Secondary | ICD-10-CM | POA: Diagnosis not present

## 2015-12-11 DIAGNOSIS — M533 Sacrococcygeal disorders, not elsewhere classified: Secondary | ICD-10-CM

## 2015-12-11 DIAGNOSIS — R531 Weakness: Secondary | ICD-10-CM | POA: Diagnosis not present

## 2015-12-11 DIAGNOSIS — M5137 Other intervertebral disc degeneration, lumbosacral region: Secondary | ICD-10-CM

## 2015-12-11 DIAGNOSIS — M51379 Other intervertebral disc degeneration, lumbosacral region without mention of lumbar back pain or lower extremity pain: Secondary | ICD-10-CM

## 2015-12-11 DIAGNOSIS — M51369 Other intervertebral disc degeneration, lumbar region without mention of lumbar back pain or lower extremity pain: Secondary | ICD-10-CM

## 2015-12-11 MED ORDER — CEFUROXIME AXETIL 250 MG PO TABS: 250.0000 mg | ORAL_TABLET | Freq: Two times a day (BID) | ORAL | Status: DC

## 2015-12-11 MED ORDER — ORPHENADRINE CITRATE 30 MG/ML IJ SOLN
60.0000 mg | Freq: Once | INTRAMUSCULAR | Status: AC
  Administered 2015-12-11: 60 mg via INTRAMUSCULAR
  Filled 2015-12-11: qty 2

## 2015-12-11 MED ORDER — FENTANYL CITRATE (PF) 100 MCG/2ML IJ SOLN
100.0000 ug | Freq: Once | INTRAMUSCULAR | Status: AC
  Administered 2015-12-11: 100 ug via INTRAVENOUS
  Filled 2015-12-11: qty 2

## 2015-12-11 MED ORDER — CEFAZOLIN SODIUM 1-5 GM-% IV SOLN
1.0000 g | Freq: Once | INTRAVENOUS | Status: AC
  Administered 2015-12-11: 1 g via INTRAVENOUS

## 2015-12-11 MED ORDER — MIDAZOLAM HCL 5 MG/5ML IJ SOLN
5.0000 mg | Freq: Once | INTRAMUSCULAR | Status: AC
  Administered 2015-12-11: 5 mg via INTRAVENOUS
  Filled 2015-12-11: qty 5

## 2015-12-11 MED ORDER — LIDOCAINE HCL (PF) 1 % IJ SOLN
10.0000 mL | Freq: Once | INTRAMUSCULAR | Status: AC
  Administered 2015-12-11: 10 mL via SUBCUTANEOUS
  Filled 2015-12-11: qty 10

## 2015-12-11 MED ORDER — BUPIVACAINE HCL (PF) 0.25 % IJ SOLN
30.0000 mL | Freq: Once | INTRAMUSCULAR | Status: AC
  Administered 2015-12-11: 30 mL
  Filled 2015-12-11: qty 30

## 2015-12-11 MED ORDER — TRIAMCINOLONE ACETONIDE 40 MG/ML IJ SUSP
40.0000 mg | Freq: Once | INTRAMUSCULAR | Status: AC
  Administered 2015-12-11: 40 mg
  Filled 2015-12-11: qty 1

## 2015-12-11 MED ORDER — SODIUM CHLORIDE 0.9% FLUSH
20.0000 mL | Freq: Once | INTRAVENOUS | Status: AC
  Administered 2015-12-11: 20 mL

## 2015-12-11 MED ORDER — LACTATED RINGERS IV SOLN
1000.0000 mL | INTRAVENOUS | Status: DC
  Administered 2015-12-11: 1000 mL via INTRAVENOUS

## 2015-12-11 NOTE — Patient Instructions (Addendum)
PLAN   Continue present medications  Neurontin and tramadol and Norflex CAUTION Mobic can elevate your blood pressure as well as cause damage to your liver and kidney and stomach please discuss Mobic with Dr. Venia Minks Caution Norflex can cause respiratory depression, confusion, and other side effects as previously discussed Please obtain Ceftin antibiotic today and begin taking Ceftin antibiotic today as prescribed  F/U PCP Dr. Venia Minks for evaliation of BP and general medical condition.  . See Dr. Venia Minks this week for evaluation of elevated blood pressure as discussed  F/U surgical evaluation. Patient without plans for surgery at this time  F/U neurological evaluation. May consider [facility] EMG studies and other studies  F/U oncological evaluation. Patient will follow-up with Dr. Grayland Ormond as planned  May consider radiofrequency rhizolysis or intraspinal procedures pending response to present treatment and F/U evaluation   Patient to call [facility] Center should patient have concerns prior to scheduled return appointmentPain Management Discharge Instructions  General Discharge Instructions :  If you need to reach your doctor call: Monday-Friday 8:00 am - 4:00 pm at [phone] or toll free [phone].  After clinic hours [phone] to have operator reach doctor.  Bring all of your medication bottles to all your appointments in the pain clinic.  To cancel or reschedule your appointment with Pain Management please remember to call 24 hours in advance to avoid a fee.  Refer to the educational materials which you have been given on: General Risks, I had my Procedure. Discharge Instructions, Post Sedation.  Post Procedure Instructions:  The drugs you were given will stay in your system until tomorrow, so for the next 24 hours you should not drive, make any legal decisions or drink any alcoholic beverages.  You may eat anything you prefer, but it is better to start with liquids then  soups and crackers, and gradually work up to solid foods.  Please notify your doctor immediately if you have any unusual bleeding, trouble breathing or pain that is not related to your normal pain.  Depending on the type of procedure that was done, some parts of your body may feel week and/or numb.  This usually clears up by tonight or the next day.  Walk with the use of an assistive device or accompanied by an adult for the 24 hours.  You may use ice on the affected area for the first 24 hours.  Put ice in a Ziploc bag and cover with a towel and place against area 15 minutes on 15 minutes off.  You may switch to heat after 24 hours.GENERAL RISKS AND COMPLICATIONS  What are the risk, side effects and possible complications? Generally speaking, most procedures are safe.  However, with any procedure there are risks, side effects, and the possibility of complications.  The risks and complications are dependent upon the sites that are lesioned, or the type of nerve block to be performed.  The closer the procedure is to the spine, the more serious the risks are.  Great care is taken when placing the radio frequency needles, block needles or lesioning probes, but sometimes complications can occur. 1. Infection: Any time there is an injection through the skin, there is a risk of infection.  This is why sterile conditions are used for these blocks.  There are four possible types of infection. 1. Localized skin infection. 2. Central Nervous System Infection-This can be in the form of Meningitis, which can be deadly. 3. Epidural Infections-This can be in the form of an epidural abscess,  which can cause pressure inside of the spine, causing compression of the spinal cord with subsequent paralysis. This would require an emergency surgery to decompress, and there are no guarantees that the patient would recover from the paralysis. 4. Discitis-This is an infection of the intervertebral discs.  It occurs in about  1% of discography procedures.  It is difficult to treat and it may lead to surgery.        2. Pain: the needles have to go through skin and soft tissues, will cause soreness.       3. Damage to internal structures:  The nerves to be lesioned may be near blood vessels or    other nerves which can be potentially damaged.       4. Bleeding: Bleeding is more common if the patient is taking blood thinners such as  aspirin, Coumadin, Ticiid, Plavix, etc., or if he/she have some genetic predisposition  such as hemophilia. Bleeding into the spinal canal can cause compression of the spinal  cord with subsequent paralysis.  This would require an emergency surgery to  decompress and there are no guarantees that the patient would recover from the  paralysis.       5. Pneumothorax:  Puncturing of a lung is a possibility, every time a needle is introduced in  the area of the chest or upper back.  Pneumothorax refers to free air around the  collapsed lung(s), inside of the thoracic cavity (chest cavity).  Another two possible  complications related to a similar event would include: Hemothorax and Chylothorax.   These are variations of the Pneumothorax, where instead of air around the collapsed  lung(s), you may have blood or chyle, respectively.       6. Spinal headaches: They may occur with any procedures in the area of the spine.       7. Persistent CSF (Cerebro-Spinal Fluid) leakage: This is a rare problem, but may occur  with prolonged intrathecal or epidural catheters either due to the formation of a fistulous  track or a dural tear.       8. Nerve damage: By working so close to the spinal cord, there is always a possibility of  nerve damage, which could be as serious as a permanent spinal cord injury with  paralysis.       9. Death:  Although rare, severe deadly allergic reactions known as "Anaphylactic  reaction" can occur to any of the medications used.      10. Worsening of the symptoms:  We can always make  thing worse.  What are the chances of something like this happening? Chances of any of this occuring are extremely low.  By statistics, you have more of a chance of getting killed in a motor vehicle accident: while driving to the hospital than any of the above occurring .  Nevertheless, you should be aware that they are possibilities.  In general, it is similar to taking a shower.  Everybody knows that you can slip, hit your head and get killed.  Does that mean that you should not shower again?  Nevertheless always keep in mind that statistics do not mean anything if you happen to be on the wrong side of them.  Even if a procedure has a 1 (one) in a 1,000,000 (million) chance of going wrong, it you happen to be that one..Also, keep in mind that by statistics, you have more of a chance of having something go wrong when taking medications.  Who  should not have this procedure? If you are on a blood thinning medication (e.g. Coumadin, Plavix, see list of "Blood Thinners"), or if you have an active infection going on, you should not have the procedure.  If you are taking any blood thinners, please inform your physician.  How should I prepare for this procedure?  Do not eat or drink anything at least six hours prior to the procedure.  Bring a driver with you .  It cannot be a taxi.  Come accompanied by an adult that can drive you back, and that is strong enough to help you if your legs get weak or numb from the local anesthetic.  Take all of your medicines the morning of the procedure with just enough water to swallow them.  If you have diabetes, make sure that you are scheduled to have your procedure done first thing in the morning, whenever possible.  If you have diabetes, take only half of your insulin dose and notify our nurse that you have done so as soon as you arrive at the clinic.  If you are diabetic, but only take blood sugar pills (oral hypoglycemic), then do not take them on the morning  of your procedure.  You may take them after you have had the procedure.  Do not take aspirin or any aspirin-containing medications, at least eleven (11) days prior to the procedure.  They may prolong bleeding.  Wear loose fitting clothing that may be easy to take off and that you would not mind if it got stained with Betadine or blood.  Do not wear any jewelry or perfume  Remove any nail coloring.  It will interfere with some of our monitoring equipment.  NOTE: Remember that this is not meant to be interpreted as a complete list of all possible complications.  Unforeseen problems may occur.  BLOOD THINNERS The following drugs contain aspirin or other products, which can cause increased bleeding during surgery and should not be taken for 2 weeks prior to and 1 week after surgery.  If you should need take something for relief of minor pain, you may take acetaminophen which is found in Tylenol,m Datril, Anacin-3 and Panadol. It is not blood thinner. The products listed below are.  Do not take any of the products listed below in addition to any listed on your instruction sheet.  A.P.C or A.P.C with Codeine Codeine Phosphate Capsules #3 Ibuprofen Ridaura  ABC compound Congesprin Imuran rimadil  Advil Cope Indocin Robaxisal  Alka-Seltzer Effervescent Pain Reliever and Antacid Coricidin or Coricidin-D  Indomethacin Rufen  Alka-Seltzer plus Cold Medicine Cosprin Ketoprofen S-A-C Tablets  Anacin Analgesic Tablets or Capsules Coumadin Korlgesic Salflex  Anacin Extra Strength Analgesic tablets or capsules CP-2 Tablets Lanoril Salicylate  Anaprox Cuprimine Capsules Levenox Salocol  Anexsia-D Dalteparin Magan Salsalate  Anodynos Darvon compound Magnesium Salicylate Sine-off  Ansaid Dasin Capsules Magsal Sodium Salicylate  Anturane Depen Capsules Marnal Soma  APF Arthritis pain formula Dewitt's Pills Measurin Stanback  Argesic Dia-Gesic Meclofenamic Sulfinpyrazone  Arthritis Bayer Timed Release  Aspirin Diclofenac Meclomen Sulindac  Arthritis pain formula Anacin Dicumarol Medipren Supac  Analgesic (Safety coated) Arthralgen Diffunasal Mefanamic Suprofen  Arthritis Strength Bufferin Dihydrocodeine Mepro Compound Suprol  Arthropan liquid Dopirydamole Methcarbomol with Aspirin Synalgos  ASA tablets/Enseals Disalcid Micrainin Tagament  Ascriptin Doan's Midol Talwin  Ascriptin A/D Dolene Mobidin Tanderil  Ascriptin Extra Strength Dolobid Moblgesic Ticlid  Ascriptin with Codeine Doloprin or Doloprin with Codeine Momentum Tolectin  Asperbuf Duoprin Mono-gesic Trendar  Aspergum Duradyne Motrin  or Motrin IB Triminicin  Aspirin plain, buffered or enteric coated Durasal Myochrisine Trigesic  Aspirin Suppositories Easprin Nalfon Trillsate  Aspirin with Codeine Ecotrin Regular or Extra Strength Naprosyn Uracel  Atromid-S Efficin Naproxen Ursinus  Auranofin Capsules Elmiron Neocylate Vanquish  Axotal Emagrin Norgesic Verin  Azathioprine Empirin or Empirin with Codeine Normiflo Vitamin E  Azolid Emprazil Nuprin Voltaren  Bayer Aspirin plain, buffered or children's or timed BC Tablets or powders Encaprin Orgaran Warfarin Sodium  Buff-a-Comp Enoxaparin Orudis Zorpin  Buff-a-Comp with Codeine Equegesic Os-Cal-Gesic   Buffaprin Excedrin plain, buffered or Extra Strength Oxalid   Bufferin Arthritis Strength Feldene Oxphenbutazone   Bufferin plain or Extra Strength Feldene Capsules Oxycodone with Aspirin   Bufferin with Codeine Fenoprofen Fenoprofen Pabalate or Pabalate-SF   Buffets II Flogesic Panagesic   Buffinol plain or Extra Strength Florinal or Florinal with Codeine Panwarfarin   Buf-Tabs Flurbiprofen Penicillamine   Butalbital Compound Four-way cold tablets Penicillin   Butazolidin Fragmin Pepto-Bismol   Carbenicillin Geminisyn Percodan   Carna Arthritis Reliever Geopen Persantine   Carprofen Gold's salt Persistin   Chloramphenicol Goody's Phenylbutazone   Chloromycetin Haltrain  Piroxlcam   Clmetidine heparin Plaquenil   Cllnoril Hyco-pap Ponstel   Clofibrate Hydroxy chloroquine Propoxyphen         Before stopping any of these medications, be sure to consult the physician who ordered them.  Some, such as Coumadin (Warfarin) are ordered to prevent or treat serious conditions such as "deep thrombosis", "pumonary embolisms", and other heart problems.  The amount of time that you may need off of the medication may also vary with the medication and the reason for which you were taking it.  If you are taking any of these medications, please make sure you notify your pain physician before you undergo any procedures.

## 2015-12-11 NOTE — Progress Notes (Signed)
   Subjective:    Patient ID: Carrie Alvarez, female    DOB: Jan 16, 1951, 65 y.o.   MRN: [id number]  HPI  PROCEDURE PERFORMED: Lumbar epidural steroid injection   NOTE: The patient is a 65 y.o. female who returns to [place] for further evaluation and treatment of pain involving the lumbar and lower extremity region. MRI revealed the patient to be with multilevel degenerative changes of the lumbar spine.. The patient is with severe pain of the lumbar region that progresses to weakness of the lower extremities with standing and walking. There is concern regarding component of patient's pain being due to lumbar stenosis with neurogenic claudication The risks, benefits, and expectations of the procedure have been discussed and explained to the patient who was understanding and in agreement with suggested treatment plan. We will proceed with lumbar epidural steroid injection as discussed and as explained to the patient who is willing to proceed with procedure as planned.   DESCRIPTION OF PROCEDURE: Lumbar epidural steroid injection with IV Versed, IV fentanyl conscious sedation, EKG, blood pressure, pulse, capnography, and pulse oximetry monitoring. The procedure was performed with the patient in the prone position under fluoroscopic guidance. A local anesthetic skin wheal of 1.5% plain lidocaine was accomplished at proposed entry site. An 18-gauge Tuohy epidural needle was inserted at the L 4 vertebral body level right of the midline via loss-of-resistance technique with negative heme and negative CSF return. A total of 4 mL of Preservative-Free normal saline with 40 mg of Kenalog injected incrementally via epidurally placed needle. Needle was removed  Myoneural block injections of the gluteal musculature region Following Betadine prep of proposed entry site a 22-gauge needle was inserted into the gluteal musculature region and following negative aspiration 2 cc of 0.25% bupivacaine  with Norflex was injected for myoneural block injection of the gluteal musculature region 4    A total of 40 mg of Kenalog was utilized for the procedure.   The patient tolerated the injection well.    PLAN:   1. Medications: We will continue presently prescribed medications. Neurontin Norflex and tramadol 2. Will consider modification of treatment regimen pending response to treatment rendered on today's visit and follow-up evaluation. 3. The patient is to follow-up with primary care physician Dr. Margarita Rana  regarding blood pressure and general medical condition status post lumbar epidural steroid injection performed on today's visit.We have advised patient to follow-up with Dr. Venia Minks this week for evaluation of blood pressure and general medical condition  4. Surgical evaluation.Has been addressed  5. Neurological evaluation.May consider [facility] EMG studies and other studiese patient may be a candidate for radiofrequency procedures, implantation device, and other treatment pending response to treatment and follow-up evaluation. 6. The patient has been advised to adhere to proper body mechanics and avoid activities which appear to aggravate condition. 7. The patient has been advised to call the Pain Management Center prior to scheduled return appointment should there be significant change in condition or should there be sign  The patient is understanding and agrees with the suggested  treatment plan   Review of Systems     Objective:   Physical Exam        Assessment & Plan:

## 2015-12-11 NOTE — Progress Notes (Signed)
Safety precautions to be maintained throughout the outpatient stay will include: orient to surroundings, keep bed in low position, maintain call bell within reach at all times, provide assistance with transfer out of bed and ambulation.  

## 2015-12-12 ENCOUNTER — Telehealth: Payer: Self-pay

## 2015-12-12 NOTE — Telephone Encounter (Signed)
Pt states she is still hurting on one side encouraged her to apply ice and call if needed

## 2015-12-24 ENCOUNTER — Inpatient Hospital Stay: Payer: Medicare Other

## 2015-12-24 ENCOUNTER — Other Ambulatory Visit: Payer: Medicare Other

## 2015-12-24 ENCOUNTER — Other Ambulatory Visit: Payer: Self-pay

## 2015-12-24 ENCOUNTER — Encounter: Payer: Self-pay | Admitting: Oncology

## 2015-12-24 ENCOUNTER — Inpatient Hospital Stay: Payer: Medicare Other | Attending: Oncology | Admitting: Oncology

## 2015-12-24 VITALS — BP 136/107 | HR 76 | Temp 96.7°F | Resp 18 | Ht 63.0 in | Wt 183.0 lb

## 2015-12-24 DIAGNOSIS — R5381 Other malaise: Secondary | ICD-10-CM | POA: Insufficient documentation

## 2015-12-24 DIAGNOSIS — Z170 Estrogen receptor positive status [ER+]: Secondary | ICD-10-CM | POA: Diagnosis not present

## 2015-12-24 DIAGNOSIS — R531 Weakness: Secondary | ICD-10-CM

## 2015-12-24 DIAGNOSIS — G620 Drug-induced polyneuropathy: Secondary | ICD-10-CM | POA: Diagnosis not present

## 2015-12-24 DIAGNOSIS — C801 Malignant (primary) neoplasm, unspecified: Secondary | ICD-10-CM

## 2015-12-24 DIAGNOSIS — N644 Mastodynia: Secondary | ICD-10-CM | POA: Diagnosis not present

## 2015-12-24 DIAGNOSIS — Z452 Encounter for adjustment and management of vascular access device: Secondary | ICD-10-CM | POA: Diagnosis not present

## 2015-12-24 DIAGNOSIS — Z803 Family history of malignant neoplasm of breast: Secondary | ICD-10-CM | POA: Diagnosis not present

## 2015-12-24 DIAGNOSIS — I10 Essential (primary) hypertension: Secondary | ICD-10-CM | POA: Diagnosis not present

## 2015-12-24 DIAGNOSIS — C773 Secondary and unspecified malignant neoplasm of axilla and upper limb lymph nodes: Secondary | ICD-10-CM | POA: Diagnosis not present

## 2015-12-24 DIAGNOSIS — Z79811 Long term (current) use of aromatase inhibitors: Secondary | ICD-10-CM | POA: Diagnosis not present

## 2015-12-24 DIAGNOSIS — C50919 Malignant neoplasm of unspecified site of unspecified female breast: Secondary | ICD-10-CM

## 2015-12-24 DIAGNOSIS — R5383 Other fatigue: Secondary | ICD-10-CM

## 2015-12-24 DIAGNOSIS — Z9221 Personal history of antineoplastic chemotherapy: Secondary | ICD-10-CM | POA: Diagnosis not present

## 2015-12-24 DIAGNOSIS — Z800 Family history of malignant neoplasm of digestive organs: Secondary | ICD-10-CM | POA: Diagnosis not present

## 2015-12-24 DIAGNOSIS — K219 Gastro-esophageal reflux disease without esophagitis: Secondary | ICD-10-CM | POA: Diagnosis not present

## 2015-12-24 DIAGNOSIS — T451X5S Adverse effect of antineoplastic and immunosuppressive drugs, sequela: Secondary | ICD-10-CM

## 2015-12-24 DIAGNOSIS — Z79899 Other long term (current) drug therapy: Secondary | ICD-10-CM

## 2015-12-24 DIAGNOSIS — M549 Dorsalgia, unspecified: Secondary | ICD-10-CM | POA: Insufficient documentation

## 2015-12-24 MED ORDER — SODIUM CHLORIDE 0.9% FLUSH
10.0000 mL | INTRAVENOUS | Status: DC | PRN
  Administered 2015-12-24: 10 mL via INTRAVENOUS
  Filled 2015-12-24: qty 10

## 2015-12-24 MED ORDER — HEPARIN SOD (PORK) LOCK FLUSH 100 UNIT/ML IV SOLN
500.0000 [IU] | Freq: Once | INTRAVENOUS | Status: AC
  Administered 2015-12-24: 500 [IU] via INTRAVENOUS

## 2015-12-24 NOTE — Progress Notes (Signed)
[place]  [phone] [phone]  ID: Carrie Alvarez OB: April 28, 1951  MR#: [id number]  WLN#:[id number]  Patient Care Team: Margarita Rana, MD as PCP - General (Family Medicine) [name], MD as Consulting Physician (Oncology) Robert Bellow, MD (General Surgery)  CHIEF COMPLAINT:  Chief Complaint  Patient presents with  . Breast Cancer    INTERVAL HISTORY: Patient returns to clinic today for routine evaluation. She still has occasional left breast pain, but otherwise feels well. She is tolerating his all without significant side effects. Her blood pressure is up today but her pcp is aware. She continues to have peripheral neuropathy which is painful. She takes Neurontin four times a day. States it worked better when she took 2 tabs twice daily rather than 1 tab four times daily. She has no other neurologic complaints. She denies any recent fevers. She has no chest pain or shortness of breath. She denies any nausea, vomiting, constipation, or diarrhea.  She has no urinary complaints. Patient offers no further specific complaints today. She has requested to have her port removed.   REVIEW OF SYSTEMS:   Review of Systems  Constitutional: Positive for malaise/fatigue. Negative for fever and weight loss.  Respiratory: Negative.  Negative for shortness of breath.   Cardiovascular: Negative.  Negative for chest pain.  Gastrointestinal: Negative.   Neurological: Positive for sensory change and weakness. Negative for dizziness and headaches.  Psychiatric/Behavioral: Negative.     As per HPI. Otherwise, a complete review of systems is negatve.  PAST MEDICAL HISTORY: Past Medical History  Diagnosis Date  . Hypertension   . Back pain   . Obesity   . Allergy   . Carpal tunnel syndrome   . Depression   . GERD (gastroesophageal reflux disease)   . Anemia   . Uterine cancer ([place]) 1994  . Breast cancer ([place]) 2015    LT LUMPECTOMY  12-2014 FOLLOWING CHEMO    PAST SURGICAL HISTORY: Past Surgical History  Procedure Laterality Date  . Breast surgery    . Axillary lymph node biopsy Left 12/18/2014    Procedure: AXILLARY LYMPH NODE BIOPSY/;  Surgeon: Robert Bellow, MD;  Location: [facility] ORS;  Service: General;  Laterality: Left;  . Sentinel node biopsy Left 12/18/2014    Procedure: SENTINEL NODE BIOPSY;  Surgeon: Robert Bellow, MD;  Location: [facility] ORS;  Service: General;  Laterality: Left;  . Axillary lymph node dissection Left 12/18/2014    Procedure: AXILLARY LYMPH NODE DISSECTION;  Surgeon: Robert Bellow, MD;  Location: [facility] ORS;  Service: General;  Laterality: Left;  . Breast biopsy Left 05/2014    CORE - POS  . Abdominal hysterectomy  1994    UTERINE CA  . Medial branch block  11/06/2015    lumbar facet Dr. Primus Bravo    FAMILY HISTORY Family History  Problem Relation Age of Onset  . Breast cancer Sister   . Colon cancer Mother   . Hypertension Mother   . Asthma Mother   . Cancer Father        ADVANCED DIRECTIVES:    HEALTH MAINTENANCE: Social History  Substance Use Topics  . Smoking status: Former Smoker -- 0.50 packs/day    Types: Cigarettes    Quit date: 07/18/2014  . Smokeless tobacco: Never Used     Comment: given smoking and pain information   . Alcohol Use: No    No Known Allergies  Current Outpatient Prescriptions  Medication Sig Dispense Refill  .  amLODipine (NORVASC) 2.5 MG tablet TAKE ONE TABLET BY MOUTH ONCE DAILY 90 tablet 3  . cholecalciferol (VITAMIN D) 1000 units tablet Take 1,000 Units by mouth daily.    . fluticasone (FLONASE) 50 MCG/ACT nasal spray Place 1 spray into both nostrils as needed for allergies or rhinitis.    Marland Kitchen gabapentin (NEURONTIN) 300 MG capsule Limit 1 tablet by mouth 2-4 times per day if tolerated 100 capsule 0  . letrozole (FEMARA) 2.5 MG tablet Take 1 tablet (2.5 mg total) by mouth daily. 90 tablet 3  . lidocaine-prilocaine (EMLA) cream Apply 1 application  topically as needed. Apply to port then cover with saran wrap 1-2 hours prior to chemotherapy appointment 30 g 1  . loratadine (CLARITIN) 10 MG tablet Take 10 mg by mouth daily.    . meclizine (ANTIVERT) 12.5 MG tablet Take 1 tablet (12.5 mg total) by mouth every 6 (six) hours as needed for dizziness or nausea. 60 tablet 0  . meloxicam (MOBIC) 15 MG tablet TAKE ONE TABLET BY MOUTH ONCE DAILY 90 tablet 1  . orphenadrine (NORFLEX) 100 MG tablet Limit 1 tablet by mouth per day or twice per day if tolerated 60 tablet 0  . pyridOXINE (VITAMIN B-6) 100 MG tablet Take 100 mg by mouth daily.    . sertraline (ZOLOFT) 50 MG tablet TAKE FOUR TABLETS BY MOUTH AT BEDTIME 120 tablet 1  . traMADol (ULTRAM) 50 MG tablet Limit 1 tab by mouth 2-4 times per day if tolerated 100 tablet 0  . vitamin B-12 (CYANOCOBALAMIN) 500 MCG tablet Take 500 mcg by mouth daily.     Current Facility-Administered Medications  Medication Dose Route Frequency Provider Last Rate Last Dose  . bupivacaine (PF) (MARCAINE) 0.25 % injection 30 mL  30 mL Other Once [name], MD      . bupivacaine (PF) (MARCAINE) 0.25 % injection 30 mL  30 mL Other Once [name], MD      . ceFAZolin (ANCEF) IVPB 1 g/50 mL premix  1 g Intravenous Once [name], MD      . ceFAZolin (ANCEF) IVPB 1 g/50 mL premix  1 g Intravenous Once [name], MD      . ceFAZolin (ANCEF) IVPB 1 g/50 mL premix  1 g Intravenous Once [name], MD      . fentaNYL (SUBLIMAZE) injection 100 mcg  100 mcg Intravenous Once [name], MD      . fentaNYL (SUBLIMAZE) injection 100 mcg  100 mcg Intravenous Once [name], MD      . lactated ringers infusion 1,000 mL  1,000 mL Intravenous Continuous [name], MD      . lactated ringers infusion 1,000 mL  1,000 mL Intravenous Continuous [name], MD      . lactated ringers infusion 1,000 mL  1,000 mL Intravenous Continuous [name], MD      . lactated ringers infusion 1,000 mL  1,000 mL  Intravenous Continuous [name], MD 125 mL/hr at 12/11/15 1208 1,000 mL at 12/11/15 1208  . lidocaine (PF) (XYLOCAINE) 1 % injection 10 mL  10 mL Subcutaneous Once [name], MD      . midazolam (VERSED) 5 MG/5ML injection 5 mg  5 mg Intravenous Once [name], MD      . midazolam (VERSED) 5 MG/5ML injection 5 mg  5 mg Intravenous Once [name], MD      . orphenadrine (NORFLEX) injection 60 mg  60 mg Intramuscular Once [name], MD      .  orphenadrine (NORFLEX) injection 60 mg  60 mg Intramuscular Once [name], MD      . triamcinolone acetonide [facility]) injection 40 mg  40 mg Other Once [name], MD      . triamcinolone acetonide [facility]) injection 40 mg  40 mg Other Once [name], MD       Facility-Administered Medications Ordered in Other Visits  Medication Dose Route Frequency Provider Last Rate Last Dose  . heparin lock flush 100 unit/mL  500 Units Intravenous Once [name], MD      . sodium chloride 0.9 % injection 10 mL  10 mL Intravenous Once [name], MD      . sodium chloride flush (NS) 0.9 % injection 10 mL  10 mL Intravenous PRN [name], MD   10 mL at 12/24/15 1444    OBJECTIVE: Filed Vitals:   12/24/15 1513  BP: 136/107  Pulse: 76  Temp: 96.7 F (35.9 C)  Resp: 18     Body mass index is 32.42 kg/(m^2).    ECOG FS:1 - Symptomatic but completely ambulatory  General: Well-developed, well-nourished, no acute distress. Eyes: anicteric sclera. Breasts: Bilateral breast and axilla without lumps or masses. Lungs: Clear to auscultation bilaterally. Heart: Regular rate and rhythm. No rubs, murmurs, or gallops. Abdomen: Soft, nontender, nondistended.  Musculoskeletal: No edema, cyanosis, or clubbing. Neuro: Alert, answering all questions appropriately. Cranial nerves grossly intact. Skin: No rashes or petechiae noted. Psych: Normal affect.    LAB RESULTS:  Lab Results  Component Value Date   NA  143 09/27/2015   K 3.8 09/27/2015   CL 106 09/27/2015   CO2 20 09/27/2015   GLUCOSE 85 09/27/2015   BUN 19 09/27/2015   CREATININE 0.98 09/27/2015   CALCIUM 9.7 09/27/2015   PROT 6.5 09/27/2015   ALBUMIN 4.0 09/27/2015   AST 16 09/27/2015   ALT 12 09/27/2015   ALKPHOS 94 09/27/2015   BILITOT 0.2 09/27/2015   GFRNONAA 61 09/27/2015   GFRAA 70 09/27/2015    Lab Results  Component Value Date   WBC 8.7 09/27/2015   NEUTROABS 6.8 09/27/2015   HGB 11.4* 09/03/2015   HCT 34.7 09/27/2015   MCV 81 09/27/2015   PLT 259 09/27/2015     STUDIES: No results found.  ASSESSMENT: Stage IIa ER+ breast cancer with no breast lesion and axillary lymph node metastasis. (TxN1M0)  HER-2 negative.   PLAN:   1. Breast cancer: Despite no obvious breast lesion on mammogram or breast MRI, pathology and pattern of spread was consistent with primary breast cancer. CT and bone scan with no other evidence of malignancy. Previously because of difficulty with treatment, patient only received 3 cycles of Adriamycin and Cytoxan. She did not complete 12 cycles of weekly Taxol. It was elected not to pursue axillary node dissection given the potential morbidity of this procedure. It was also elected not to pursue adjuvant XRT given there was no primary breast lesion. Continue with letrozole which patient will require to take for 5 years completing in July 2021. Baseline bone mineral density on January 24, 2015 with a T score of -0.7, which is considered normal. Repeat in 2 years. Her most recent mammogram on June 20, 2015 was reported as BI-RADS 1, repeat in one year. Return to clinic in 3 months for routine evaluation. 2. Family history: Patient's sister reports she has positive for Lynch syndrome and we are attempting to confirm these results. Patient may also benefit from genetic testing herself  in the near future.  Patient had a colonoscopy in October 2012 that was reported as normal. 3. Hypertension: Treatment as  per primary care.  4. Peripheral neuropathy: Secondary to Taxol. Continue gabapentin - take 2 tabs twice daily.  5. Port: Ok to have removed. Patient is one year out from chemotherapy. Dr Tollie Pizza office contacted.   Patient expressed understanding and was in agreement with this plan. She also understands that She can call clinic at any time with any questions, concerns, or complaints.   Breast cancer metastasized to axillary lymph node   Staging form: Breast, AJCC 7th Edition     Clinical stage from 10/22/2014: Stage Unknown (TX, N1, M0) - Signed by [name], MD on 10/22/2014  Dr. Grayland Ormond was available for consultation and review of plan of care for this patient  Mayra Reel, NP   12/24/2015 3:32 PM

## 2015-12-24 NOTE — Progress Notes (Signed)
Pt states she still has occ. Pain in her breast but goes and comes quickly.  She cont. To have neuropathy of feet affecting toes and top and bottom of feet to the level of toe arching out on side of feet.  Cont. To take neurontin most every day 1 qid.  She also states her b/p is an issue and she sees [place] and she does not want to change anything for fear it will be worse or make it too low.  She is still monitoring it. She would like to discuss taking out portacath.   Gilmore Laroche wants to sch. appt with [facility] to remove port. appt got for 01/02/16 at 3:15 and the order faxed to their office. Pt given copy of the order for date and time

## 2015-12-25 LAB — CANCER ANTIGEN 27.29: CA 27.29: 18.3 U/mL (ref 0.0–38.6)

## 2015-12-30 ENCOUNTER — Encounter: Payer: Self-pay | Admitting: Pain Medicine

## 2015-12-30 ENCOUNTER — Ambulatory Visit: Payer: Medicare Other | Attending: Pain Medicine | Admitting: Pain Medicine

## 2015-12-30 VITALS — BP 120/92 | HR 76 | Temp 97.8°F | Resp 16 | Ht 63.0 in | Wt 181.0 lb

## 2015-12-30 DIAGNOSIS — M4806 Spinal stenosis, lumbar region: Secondary | ICD-10-CM | POA: Insufficient documentation

## 2015-12-30 DIAGNOSIS — M5136 Other intervertebral disc degeneration, lumbar region: Secondary | ICD-10-CM

## 2015-12-30 DIAGNOSIS — M47817 Spondylosis without myelopathy or radiculopathy, lumbosacral region: Secondary | ICD-10-CM

## 2015-12-30 DIAGNOSIS — M545 Low back pain: Secondary | ICD-10-CM | POA: Diagnosis present

## 2015-12-30 DIAGNOSIS — M47816 Spondylosis without myelopathy or radiculopathy, lumbar region: Secondary | ICD-10-CM | POA: Insufficient documentation

## 2015-12-30 DIAGNOSIS — G588 Other specified mononeuropathies: Secondary | ICD-10-CM

## 2015-12-30 DIAGNOSIS — M533 Sacrococcygeal disorders, not elsewhere classified: Secondary | ICD-10-CM

## 2015-12-30 DIAGNOSIS — M5137 Other intervertebral disc degeneration, lumbosacral region: Secondary | ICD-10-CM

## 2015-12-30 DIAGNOSIS — M79605 Pain in left leg: Secondary | ICD-10-CM | POA: Diagnosis present

## 2015-12-30 DIAGNOSIS — M79604 Pain in right leg: Secondary | ICD-10-CM | POA: Diagnosis present

## 2015-12-30 DIAGNOSIS — M791 Myalgia: Secondary | ICD-10-CM | POA: Diagnosis not present

## 2015-12-30 DIAGNOSIS — M461 Sacroiliitis, not elsewhere classified: Secondary | ICD-10-CM | POA: Diagnosis not present

## 2015-12-30 DIAGNOSIS — M5416 Radiculopathy, lumbar region: Secondary | ICD-10-CM | POA: Diagnosis not present

## 2015-12-30 MED ORDER — TRAMADOL HCL 50 MG PO TABS: ORAL_TABLET | ORAL | Status: DC

## 2015-12-30 MED ORDER — ORPHENADRINE CITRATE ER 100 MG PO TB12: ORAL_TABLET | ORAL | Status: DC

## 2015-12-30 MED ORDER — GABAPENTIN 300 MG PO CAPS: ORAL_CAPSULE | ORAL | Status: DC

## 2015-12-30 NOTE — Progress Notes (Signed)
   Subjective:    Patient ID: Carrie Alvarez, female    DOB: 04/06/1951, 65 y.o.   MRN: [id number]  HPI  The patient is a 65 year old female who returns to pain management for further evaluation and treatment of pain involving the mid lower back and lower extremity regions. Patient has had significant improvement of her pain following previous treatment performed in pain management Center. The patient states that the pain at the present time radiating from the lumbar regions of the lower extremity and that the pain is aggravated by prolonged standing and walking. The patient states that the pain radiates down the left lower extremity was in the right lower extremity. We discussed medications and patient is tolerating Neurontin Norflex and tramadol without any undesirable side effects. We will proceed with lumbosacral selective nerve root block at time return appointment and will consider additional modifications of treatment regimen pending follow-up evaluation. The patient was with understanding and agreed to suggested treatment plan. The patient denied any trauma change in events of daily living the call significant change in symptomatology.  Review of Systems     Objective:   Physical Exam  There was tenderness to palpation of the cervical facet cervical paraspinal musculature region a mild degree with mild tenderness of the splenius capitis and occipitalis regions. Palpation of the thoracic region thoracic facet region was with tenderness to palpation of moderate degree with no crepitus of the thoracic region noted. Palpation of the acromioclavicular and glenohumeral joint regions reproduced mild discomfort and patient appeared to be with unremarkable Spurling's maneuver. The patient appeared to be with bilaterally equal grip strength with Tinel and Phalen's maneuver reproducing minimal discomfort. Palpation over the lumbar region lumbar facet region was attends to palpation of moderate  degree with lateral bending rotation extension and palpation of the lumbar facets reproducing moderate discomfort. Straight leg raising was tolerated to approximately 20 without increased pain with dorsiflexion noted. EHL strength appeared to be slightly decreased. Reevaluation revealed patient straight leg raising to be with questionably increased pain with dorsiflexion noted. Palpation over the PSIS and PII S regions reproduce mild to moderate discomfort. There was moderate tends to palpation of the greater trochanteric region iliotibial band region. There was negative clonus negative Homans. The abdomen was nontender with no costovertebral tenderness noted    Assessment & Plan:      Degenerative disc disease lumbar spine. Multilevel degenerative changes lumbar spine.  Lumbar facet syndrome  Lumbar stenosis      PLAN   Continue present medications  Neurontin Norflex and tramadol. CAUTION Mobic can elevate your blood pressure as well as cause damage to your liver and kidney and stomach please discuss Mobic with Dr. Venia Minks  Lumbosacral selective nerve root block to be performed at time of return appointment  F/U PCP Dr. Venia Minks for evaliation of BP and general medical condition.    F/U surgical evaluation. Patient without plans for surgery at this time  F/U neurological evaluation. May consider PNCV EMG studies and other studies  F/U oncological evaluation. Patient will follow-up with Dr. Grayland Ormond as planned  May consider radiofrequency rhizolysis or intraspinal procedures pending response to present treatment and F/U evaluation   Patient to call Pain Management Center should patient have concerns prior to scheduled return appointment

## 2015-12-30 NOTE — Patient Instructions (Addendum)
PLAN   Continue present medications  Neurontin Norflex and tramadol. CAUTION Mobic can elevate your blood pressure as well as cause damage to your liver and kidney and stomach please discuss Mobic with Dr. Venia Minks  Lumbosacral selective nerve root block to be performed at time of return appointment  F/U PCP Dr. Venia Minks for evaliation of BP and general medical condition.    F/U surgical evaluation. Patient without plans for surgery at this time  F/U neurological evaluation. May consider PNCV EMG studies and other studies  F/U oncological evaluation. Patient will follow-up with Dr. Grayland Ormond as planned  May consider radiofrequency rhizolysis or intraspinal procedures pending response to present treatment and F/U evaluation   Patient to call Pain Management Center should patient have concerns prior to scheduled return appointment  Selective Nerve Root Block Patient Information  Description: Specific nerve roots exit the spinal canal and these nerves can be compressed and inflamed by a bulging disc and bone spurs.  By injecting steroids on the nerve root, we can potentially decrease the inflammation surrounding these nerves, which often leads to decreased pain.  Also, by injecting local anesthesia on the nerve root, this can provide [place] helpful information to give to your referring doctor if it decreases your pain.  Selective nerve root blocks can be done along the spine from the neck to the low back depending on the location of your pain.   After numbing the skin with local anesthesia, a small needle is passed to the nerve root and the position of the needle is verified using x-ray pictures.  After the needle is in correct position, we then deposit the medication.  You may experience a pressure sensation while this is being done.  The entire block usually lasts less than 15 minutes.  Conditions that may be treated with selective nerve root blocks:  Low back and leg pain  Spinal  stenosis  Diagnostic block prior to potential surgery  Neck and arm pain  Post laminectomy syndrome  Preparation for the injection:  1. Do not eat any solid food or dairy products within 8 hours of your appointment. 2. You may drink clear liquids up to 3 hours before an appointment.  Clear liquids include water, black coffee, juice or soda.  No milk or cream please. 3. You may take your regular medications, including pain medications, with a sip of water before your appointment.  Diabetics should hold regular insulin (if taken separately) and take 1/2 normal NPH dose the morning of the procedure.  Carry some sugar containing items with you to your appointment. 4. A driver must accompany you and be prepared to drive you home after your procedure. 5. Bring all your current medications with you. 6. An IV may be inserted and sedation may be given at the discretion of the physician. 7. A blood pressure cuff, EKG, and other monitors will often be applied during the procedure.  Some patients may need to have extra oxygen administered for a short period. 8. You will be asked to provide medical information, including allergies, prior to the procedure.  We must know immediately if you are taking blood  Thinners (like Coumadin) or if you are allergic to IV iodine contrast (dye).  Possible side-effects: All are usually temporary  Bleeding from needle site  Light headedness  Numbness and tingling  Decreased blood pressure  Weakness in arms/legs  Pressure sensation in back/neck  Pain at injection site (several days)  Possible complications: All are extremely rare  Infection  Nerve injury  Spinal headache (a headache wore with upright position)  Call if you experience:  Fever/chills associated with headache or increased back/neck pain  Headache worsened by an upright position  New onset weakness or numbness of an extremity below the injection site  Hives or difficulty  breathing (go to the emergency room)  Inflammation or drainage at the injection site(s)  Severe back/neck pain greater than usual  New symptoms which are concerning to you  Please note:  Although the local anesthetic injected can often make your back or neck feel good for several hours after the injection the pain will likely return.  It takes 3-5 days for steroids to work on the nerve root. You may not notice any pain relief for at least one week.  If effective, we will often do a series of 3 injections spaced 3-6 weeks apart to maximally decrease your pain.    If you have any questions, please call [phone] [facility]  A prescription for Norflex, Gabapentin was sent to your pharmacy and should be available for pickup today.  A prescription for Tramadol was given to you today.

## 2015-12-30 NOTE — Progress Notes (Signed)
Safety precautions to be maintained throughout the outpatient stay will include: orient to surroundings, keep bed in low position, maintain call bell within reach at all times, provide assistance with transfer out of bed and ambulation.  

## 2016-01-02 ENCOUNTER — Encounter: Payer: Self-pay | Admitting: General Surgery

## 2016-01-02 ENCOUNTER — Ambulatory Visit (INDEPENDENT_AMBULATORY_CARE_PROVIDER_SITE_OTHER): Payer: Medicare Other | Admitting: General Surgery

## 2016-01-02 ENCOUNTER — Telehealth: Payer: Self-pay

## 2016-01-02 VITALS — BP 118/82 | HR 72 | Resp 14 | Ht 63.0 in | Wt 181.0 lb

## 2016-01-02 DIAGNOSIS — C50912 Malignant neoplasm of unspecified site of left female breast: Secondary | ICD-10-CM

## 2016-01-02 DIAGNOSIS — C773 Secondary and unspecified malignant neoplasm of axilla and upper limb lymph nodes: Secondary | ICD-10-CM

## 2016-01-02 DIAGNOSIS — E559 Vitamin D deficiency, unspecified: Secondary | ICD-10-CM

## 2016-01-02 NOTE — Telephone Encounter (Signed)
Vitamin D lab printed. Renaldo Fiddler, CMA

## 2016-01-02 NOTE — Patient Instructions (Signed)
Keep area clean May shower May remove outer dressing in 2-3 days, steri strips will gradually fall off.

## 2016-01-02 NOTE — Progress Notes (Signed)
Patient ID: Carrie Alvarez, female   DOB: 1951/07/13, 65 y.o.   MRN: [id number]  Chief Complaint  Patient presents with  . Procedure    port removal    HPI Carrie Alvarez is a 65 y.o. female.  Here today for port removal. Request for port removal by medical oncology has been received.  HPI  Past Medical History  Diagnosis Date  . Hypertension   . Back pain   . Obesity   . Allergy   . Carpal tunnel syndrome   . Depression   . GERD (gastroesophageal reflux disease)   . Anemia   . Uterine cancer ([place]) 1994  . Breast cancer ([place]) 2015    LT LUMPECTOMY 12-2014 FOLLOWING CHEMO    Past Surgical History  Procedure Laterality Date  . Breast surgery    . Axillary lymph node biopsy Left 12/18/2014    Procedure: AXILLARY LYMPH NODE BIOPSY/;  Surgeon: Robert Bellow, MD;  Location: [facility] ORS;  Service: General;  Laterality: Left;  . Sentinel node biopsy Left 12/18/2014    Procedure: SENTINEL NODE BIOPSY;  Surgeon: Robert Bellow, MD;  Location: [facility] ORS;  Service: General;  Laterality: Left;  . Axillary lymph node dissection Left 12/18/2014    Procedure: AXILLARY LYMPH NODE DISSECTION;  Surgeon: Robert Bellow, MD;  Location: [facility] ORS;  Service: General;  Laterality: Left;  . Breast biopsy Left 05/2014    CORE - POS  . Abdominal hysterectomy  1994    UTERINE CA  . Medial branch block  11/06/2015    lumbar facet Dr. Primus Bravo    Family History  Problem Relation Age of Onset  . Breast cancer Sister   . Colon cancer Mother   . Hypertension Mother   . Asthma Mother   . Cancer Father     Social History Social History  Substance Use Topics  . Smoking status: Former Smoker -- 0.50 packs/day    Types: Cigarettes    Quit date: 07/18/2014  . Smokeless tobacco: Never Used     Comment: given smoking and pain information   . Alcohol Use: No    No Known Allergies  Current Outpatient Prescriptions  Medication Sig Dispense Refill  . amLODipine (NORVASC) 2.5 MG tablet  TAKE ONE TABLET BY MOUTH ONCE DAILY 90 tablet 3  . cholecalciferol (VITAMIN D) 1000 units tablet Take 1,000 Units by mouth daily.    . fluticasone (FLONASE) 50 MCG/ACT nasal spray Place 1 spray into both nostrils as needed for allergies or rhinitis.    Marland Kitchen gabapentin (NEURONTIN) 300 MG capsule Limit 1 tablet by mouth 2-4 times per day if tolerated 100 capsule 0  . letrozole (FEMARA) 2.5 MG tablet Take 1 tablet (2.5 mg total) by mouth daily. 90 tablet 3  . lidocaine-prilocaine (EMLA) cream Apply 1 application topically as needed. Apply to port then cover with saran wrap 1-2 hours prior to chemotherapy appointment (Patient not taking: Reported on 12/30/2015) 30 g 1  . loratadine (CLARITIN) 10 MG tablet Take 10 mg by mouth daily.    . meclizine (ANTIVERT) 12.5 MG tablet Take 1 tablet (12.5 mg total) by mouth every 6 (six) hours as needed for dizziness or nausea. 60 tablet 0  . meloxicam (MOBIC) 15 MG tablet TAKE ONE TABLET BY MOUTH ONCE DAILY 90 tablet 1  . orphenadrine (NORFLEX) 100 MG tablet Limit 1 tablet by mouth per day or twice per day if tolerated 60 tablet 0  . pyridOXINE (VITAMIN B-6) 100 MG tablet  Take 100 mg by mouth daily.    . sertraline (ZOLOFT) 50 MG tablet TAKE FOUR TABLETS BY MOUTH AT BEDTIME 120 tablet 1  . traMADol (ULTRAM) 50 MG tablet Limit 1 tab by mouth 2-4 times per day if tolerated 100 tablet 0  . vitamin B-12 (CYANOCOBALAMIN) 500 MCG tablet Take 500 mcg by mouth daily.     Current Facility-Administered Medications  Medication Dose Route Frequency Provider Last Rate Last Dose  . bupivacaine (PF) (MARCAINE) 0.25 % injection 30 mL  30 mL Other Once [name], MD      . bupivacaine (PF) (MARCAINE) 0.25 % injection 30 mL  30 mL Other Once [name], MD      . ceFAZolin (ANCEF) IVPB 1 g/50 mL premix  1 g Intravenous Once [name], MD      . ceFAZolin (ANCEF) IVPB 1 g/50 mL premix  1 g Intravenous Once [name], MD      . ceFAZolin (ANCEF) IVPB 1 g/50 mL premix  1  g Intravenous Once [name], MD      . fentaNYL (SUBLIMAZE) injection 100 mcg  100 mcg Intravenous Once [name], MD      . fentaNYL (SUBLIMAZE) injection 100 mcg  100 mcg Intravenous Once [name], MD      . lactated ringers infusion 1,000 mL  1,000 mL Intravenous Continuous [name], MD      . lactated ringers infusion 1,000 mL  1,000 mL Intravenous Continuous [name], MD      . lactated ringers infusion 1,000 mL  1,000 mL Intravenous Continuous [name], MD      . lactated ringers infusion 1,000 mL  1,000 mL Intravenous Continuous [name], MD 125 mL/hr at 12/11/15 1208 1,000 mL at 12/11/15 1208  . lidocaine (PF) (XYLOCAINE) 1 % injection 10 mL  10 mL Subcutaneous Once [name], MD      . midazolam (VERSED) 5 MG/5ML injection 5 mg  5 mg Intravenous Once [name], MD      . midazolam (VERSED) 5 MG/5ML injection 5 mg  5 mg Intravenous Once [name], MD      . orphenadrine (NORFLEX) injection 60 mg  60 mg Intramuscular Once [name], MD      . orphenadrine (NORFLEX) injection 60 mg  60 mg Intramuscular Once [name], MD      . triamcinolone acetonide (KENALOG-40) injection 40 mg  40 mg Other Once [name], MD      . triamcinolone acetonide (KENALOG-40) injection 40 mg  40 mg Other Once [name], MD       Facility-Administered Medications Ordered in Other Visits  Medication Dose Route Frequency Provider Last Rate Last Dose  . heparin lock flush 100 unit/mL  500 Units Intravenous Once [name], MD      . sodium chloride 0.9 % injection 10 mL  10 mL Intravenous Once [name], MD      . sodium chloride flush (NS) 0.9 % injection 10 mL  10 mL Intravenous PRN [name], MD   10 mL at 12/24/15 1444    Review of Systems Review of Systems  Constitutional: Negative.   Respiratory: Negative.   Cardiovascular: Negative.     Blood pressure 118/82, pulse 72, resp. rate 14, height 5\' 3"  (1.6 m), weight  181 lb (82.101 kg).  Physical Exam Physical Exam  Pulmonary/Chest:        Assessment    Candidate for PowerPort removal.  Plan    The procedure was reviewed and the patient was amenable to proceed. 10 mL of 0.5% Xylocaine with 0.25% Marcaine with 1-200,000 epinephrine was utilized well tolerated. This was supplemented oh 2 mL of 1% plain Xylocaine. ChloraPrep was applied to the skin. The previous  incision was opened with slight extension laterally. Hemostasis was with 3-0 Vicryl suture ligatures. The port was freed from its transfixion sutures and removed with an intact tip. The wound was closed in layers with 3-0 Vicryl suture to the adipose layer and a running 3-0 Vicryl subcuticular suture for the skin. Benzoin, Steri-Strip, Telfa and Tegaderm dressing applied.  Ice applied in postop wound care reviewed.  The patient is welcome return in one week for wound check with the nurse if she desires.     PCP:  Margarita Rana Ref Lynden Oxford NP  This information has been scribed by Karie Fetch RN, BSN,BC.   Robert Bellow 01/02/2016, 8:41 PM

## 2016-01-03 DIAGNOSIS — E559 Vitamin D deficiency, unspecified: Secondary | ICD-10-CM | POA: Diagnosis not present

## 2016-01-04 LAB — VITAMIN D 25 HYDROXY (VIT D DEFICIENCY, FRACTURES): Vit D, 25-Hydroxy: 41.6 ng/mL (ref 30.0–100.0)

## 2016-01-20 ENCOUNTER — Ambulatory Visit: Payer: Medicare Other | Attending: Pain Medicine | Admitting: Pain Medicine

## 2016-01-20 ENCOUNTER — Encounter: Payer: Self-pay | Admitting: Pain Medicine

## 2016-01-20 DIAGNOSIS — M5136 Other intervertebral disc degeneration, lumbar region: Secondary | ICD-10-CM | POA: Diagnosis not present

## 2016-01-20 DIAGNOSIS — M47816 Spondylosis without myelopathy or radiculopathy, lumbar region: Secondary | ICD-10-CM | POA: Diagnosis not present

## 2016-01-20 DIAGNOSIS — G588 Other specified mononeuropathies: Secondary | ICD-10-CM

## 2016-01-20 DIAGNOSIS — M79606 Pain in leg, unspecified: Secondary | ICD-10-CM | POA: Diagnosis present

## 2016-01-20 DIAGNOSIS — M545 Low back pain: Secondary | ICD-10-CM | POA: Diagnosis present

## 2016-01-20 DIAGNOSIS — M5137 Other intervertebral disc degeneration, lumbosacral region: Secondary | ICD-10-CM

## 2016-01-20 DIAGNOSIS — M51369 Other intervertebral disc degeneration, lumbar region without mention of lumbar back pain or lower extremity pain: Secondary | ICD-10-CM

## 2016-01-20 DIAGNOSIS — M51379 Other intervertebral disc degeneration, lumbosacral region without mention of lumbar back pain or lower extremity pain: Secondary | ICD-10-CM

## 2016-01-20 DIAGNOSIS — M47817 Spondylosis without myelopathy or radiculopathy, lumbosacral region: Secondary | ICD-10-CM

## 2016-01-20 DIAGNOSIS — M5416 Radiculopathy, lumbar region: Secondary | ICD-10-CM | POA: Diagnosis not present

## 2016-01-20 DIAGNOSIS — M533 Sacrococcygeal disorders, not elsewhere classified: Secondary | ICD-10-CM

## 2016-01-20 MED ORDER — LIDOCAINE HCL (PF) 1 % IJ SOLN: 10.0000 mL | Freq: Once | INTRAMUSCULAR | Status: DC

## 2016-01-20 MED ORDER — CEFAZOLIN IN D5W 1 GM/50ML IV SOLN: 1.0000 g | Freq: Once | INTRAVENOUS | Status: DC

## 2016-01-20 MED ORDER — MIDAZOLAM HCL 5 MG/5ML IJ SOLN
5.0000 mg | Freq: Once | INTRAMUSCULAR | Status: AC
  Administered 2016-01-20: 3 mg via INTRAVENOUS
  Filled 2016-01-20: qty 5

## 2016-01-20 MED ORDER — CEFUROXIME AXETIL 250 MG PO TABS: 250.0000 mg | ORAL_TABLET | Freq: Two times a day (BID) | ORAL | Status: DC

## 2016-01-20 MED ORDER — CEFAZOLIN SODIUM 1 G IJ SOLR
INTRAMUSCULAR | Status: AC
  Administered 2016-01-20: 1 g via INTRAVENOUS
  Filled 2016-01-20: qty 10

## 2016-01-20 MED ORDER — FENTANYL CITRATE (PF) 100 MCG/2ML IJ SOLN
100.0000 ug | Freq: Once | INTRAMUSCULAR | Status: AC
  Administered 2016-01-20: 50 ug via INTRAVENOUS
  Filled 2016-01-20: qty 2

## 2016-01-20 MED ORDER — TRIAMCINOLONE ACETONIDE 40 MG/ML IJ SUSP
40.0000 mg | Freq: Once | INTRAMUSCULAR | Status: AC
  Administered 2016-01-20: 40 mg
  Filled 2016-01-20: qty 1

## 2016-01-20 MED ORDER — BUPIVACAINE HCL (PF) 0.25 % IJ SOLN
30.0000 mL | Freq: Once | INTRAMUSCULAR | Status: AC
  Administered 2016-01-20: 30 mL
  Filled 2016-01-20: qty 30

## 2016-01-20 MED ORDER — LACTATED RINGERS IV SOLN: 1000.0000 mL | INTRAVENOUS | Status: DC

## 2016-01-20 MED ORDER — ORPHENADRINE CITRATE 30 MG/ML IJ SOLN
60.0000 mg | Freq: Once | INTRAMUSCULAR | Status: AC
  Administered 2016-01-20: 60 mg via INTRAMUSCULAR
  Filled 2016-01-20: qty 2

## 2016-01-20 MED ORDER — TRIAMCINOLONE ACETONIDE 40 MG/ML IJ SUSP
INTRAMUSCULAR | Status: AC
  Administered 2016-01-20: 11:00:00
  Filled 2016-01-20: qty 1

## 2016-01-20 NOTE — Progress Notes (Signed)
Safety precautions to be maintained throughout the outpatient stay will include: orient to surroundings, keep bed in low position, maintain call bell within reach at all times, provide assistance with transfer out of bed and ambulation.  

## 2016-01-20 NOTE — Progress Notes (Signed)
Subjective:    Patient ID: Carrie Alvarez, female    DOB: [birth year], 65 y.o.   MRN: [id number]  HPI  PROCEDURE PERFORMED: Lumbosacral selective nerve root block   NOTE: The patient is a 65 y.o. female who returns to [facility] for further evaluation and treatment of pain involving the lumbar and lower extremity region. Studies consisting of MRI has revealed the patient to be with evidence of degenerative disc disease lumbar spine with multilevel degenerative changes lumbar spine.. There is concern regarding intraspinal abnormalities contributing to the patient's symptomatology. There is concern regarding component of patient's pain being due to lumbar radiculopathy  The risks, benefits, and expectations of the procedure have been explained to the patient who was understanding and in agreement with suggested treatment plan. We will proceed with interventional treatment as discussed and as explained to the patient. The patient is understanding and in agreement with suggested treatment plan.   DESCRIPTION OF PROCEDURE: Lumbosacral selective nerve root block with IV Versed, IV fentanyl conscious sedation, EKG, blood pressure, pulse, capnography, and pulse oximetry monitoring. The procedure was performed with the patient in the prone position under fluoroscopic guidance. With the patient in the prone position, Betadine prep of proposed entry site was performed. Local anesthetic skin wheal of proposed needle entry site was prepared with 1.5% plain lidocaine with AP view of the lumbosacral spine.   PROCEDURE #1: Needle placement at the left  L2  vertebral body: A 22 -gauge needle was inserted at the inferior border of the transverse process of the vertebral body with needle placed medial to the midline of the transverse process on AP view of the lumbosacral spine.   NEEDLE PLACEMENT AT  L3, L4, and L5  VERTEBRAL BODY LEVELS  Needle  placement was accomplished at L3, L4, and L5   vertebral body levels on the left  side exactly as was accomplished at the L2  vertebral body level  and utilizing the same technique and under fluoroscopic guidance.   Needle placement was then verified on lateral view at all levels with needle tip documented to be in the posterior superior quadrant of the intervertebral foramen of  L 2, L3, L4, and L5. Following negative aspiration for heme and CSF at each level, each level was injected with 3 mL of 0.25% bupivacaine with Kenalog    The patient tolerated the procedure well. A total of 10 mg of Kenalog was utilized for the procedure.   PLAN:  1. Medications: Will continue presently prescribed medications.Neurontin and tramadol  2. The patient is to undergo follow-up evaluation with PCPDr. Venia Minks  for evaluation of blood pressure and general medical condition status post procedure performed on today's visit.. The patient will follow-up with Dr. Venia Minks disc week for evaluation of elevated blood pressure as discussed  3. Surgical follow-up evaluation.Has been addressed  4. Neurological evaluation.May consider PNCV EMG studies and other studies  5. May consider radiofrequency procedures, implantation type procedures and other treatment pending response to treatment and follow-up evaluation. 6. The patient has been advised do adhere to proper body mechanics and avoid activities which may aggravate condition. 7. The patient has been advised to call the [facility] prior to scheduled return appointment should there be significant change in the patient's condition or should the patient have other concerns regarding condition prior to scheduled return appointment.   Review of Systems     Objective:   Physical Exam        Assessment &  Plan:

## 2016-01-20 NOTE — Patient Instructions (Addendum)
PLAN   Continue present medications  Neurontin and tramadol CAUTION Mobic can elevate your blood pressure as well as cause damage to your liver and kidney and stomach please discuss Mobic with Dr. Venia Minks Caution Neurontin and tramadol can cause respiratory depression, confusion, and other side effects as previously discussed Please obtain Ceftin antibiotic today and begin taking Ceftin antibiotic today as prescribed  F/U PCP Dr. Venia Minks for evaliation of BP and general medical condition. . Please follow-up with Dr Venia Minks this week as discussed for evaluation of elevated BP .  F/U surgical evaluation. Patient without plans for surgery at this time  F/U neurological evaluation. May consider PN[facility] EMG studies and other studies  F/U oncological evaluation.  May consider radiofrequency rhizolysis or intraspinal procedures pending response to present treatment and F/U evaluation   Patient to call Pain Management Center should patient have concerns prior to scheduled return appointment  Selective Nerve Root Block Patient Information  Description: Specific nerve roots exit the spinal canal and these nerves can be compressed and inflamed by a bulging disc and bone spurs.  By injecting steroids on the nerve root, we can potentially decrease the inflammation surrounding these nerves, which often leads to decreased pain.  Also, by injecting local anesthesia on the nerve root, this can provide [place] helpful information to give to your referring doctor if it decreases your pain.  Selective nerve root blocks can be done along the spine from the neck to the low back depending on the location of your pain.   After numbing the skin with local anesthesia, a small needle is passed to the nerve root and the position of the needle is verified using x-ray pictures.  After the needle is in correct position, we then deposit the medication.  You may experience a pressure sensation while this is being done.  The entire  block usually lasts less than 15 minutes.  Conditions that may be treated with selective nerve root blocks:  Low back and leg pain  Spinal stenosis  Diagnostic block prior to potential surgery  Neck and arm pain  Post laminectomy syndrome  Preparation for the injection:  1. Do not eat any solid food or dairy products within 8 hours of your appointment. 2. You may drink clear liquids up to 3 hours before an appointment.  Clear liquids include water, black coffee, juice or soda.  No milk or cream please. 3. You may take your regular medications, including pain medications, with a sip of water before your appointment.  Diabetics should hold regular insulin (if taken separately) and take 1/2 normal NPH dose the morning of the procedure.  Carry some sugar containing items with you to your appointment. 4. A driver must accompany you and be prepared to drive you home after your procedure. 5. Bring all your current medications with you. 6. An IV may be inserted and sedation may be given at the discretion of the physician. 7. A blood pressure cuff, EKG, and other monitors will often be applied during the procedure.  Some patients may need to have extra oxygen administered for a short period. 8. You will be asked to provide medical information, including allergies, prior to the procedure.  We must know immediately if you are taking blood  Thinners (like Coumadin) or if you are allergic to IV iodine contrast (dye).  Possible side-effects: All are usually temporary  Bleeding from needle site  Light headedness  Numbness and tingling  Decreased blood pressure  Weakness in arms/legs  Pressure  sensation in back/neck  Pain at injection site (several days)  Possible complications: All are extremely rare  Infection  Nerve injury  Spinal headache (a headache wore with upright position)  Call if you experience:  Fever/chills associated with headache or increased back/neck  pain  Headache worsened by an upright position  New onset weakness or numbness of an extremity below the injection site  Hives or difficulty breathing (go to the emergency room)  Inflammation or drainage at the injection site(s)  Severe back/neck pain greater than usual  New symptoms which are concerning to you  Please note:  Although the local anesthetic injected can often make your back or neck feel good for several hours after the injection the pain will likely return.  It takes 3-5 days for steroids to work on the nerve root. You may not notice any pain relief for at least one week.  If effective, we will often do a series of 3 injections spaced 3-6 weeks apart to maximally decrease your pain.    If you have any questions, please call [phone] [facility]  Be careful moving about. Muscle spasms in the area of the injection may occur.  Use ice for the next 24 hours (15 minutes on, 15 minutes off).  After 24 hours, you may use heat for comfort if you wish.  Post procedure numbness or redness is expected, average 4-6 hours.  If numbness develops after 4-6 hours and is felt to be progressing and worsening, immediately contact your physician.  A prescription for CEFTIN was sent to your pharmacy and should be available for pickup today.

## 2016-01-21 ENCOUNTER — Telehealth: Payer: Self-pay | Admitting: *Deleted

## 2016-01-21 NOTE — Telephone Encounter (Signed)
Spoke with patient re; procedure on yesterday.  Verbalizes no questions or concerns.  

## 2016-01-23 ENCOUNTER — Encounter: Payer: Medicare Other | Admitting: Pain Medicine

## 2016-01-27 ENCOUNTER — Encounter: Payer: Self-pay | Admitting: Pain Medicine

## 2016-01-27 ENCOUNTER — Ambulatory Visit: Payer: Medicare Other | Attending: Pain Medicine | Admitting: Pain Medicine

## 2016-01-27 VITALS — BP 135/97 | HR 77 | Temp 98.3°F | Resp 16 | Ht 63.0 in | Wt 180.0 lb

## 2016-01-27 DIAGNOSIS — M791 Myalgia: Secondary | ICD-10-CM | POA: Diagnosis not present

## 2016-01-27 DIAGNOSIS — M5416 Radiculopathy, lumbar region: Secondary | ICD-10-CM | POA: Diagnosis not present

## 2016-01-27 DIAGNOSIS — M533 Sacrococcygeal disorders, not elsewhere classified: Secondary | ICD-10-CM | POA: Diagnosis not present

## 2016-01-27 DIAGNOSIS — M47817 Spondylosis without myelopathy or radiculopathy, lumbosacral region: Secondary | ICD-10-CM | POA: Diagnosis not present

## 2016-01-27 DIAGNOSIS — M47816 Spondylosis without myelopathy or radiculopathy, lumbar region: Secondary | ICD-10-CM | POA: Diagnosis not present

## 2016-01-27 DIAGNOSIS — M4806 Spinal stenosis, lumbar region: Secondary | ICD-10-CM | POA: Diagnosis not present

## 2016-01-27 DIAGNOSIS — M5136 Other intervertebral disc degeneration, lumbar region: Secondary | ICD-10-CM | POA: Insufficient documentation

## 2016-01-27 DIAGNOSIS — M5137 Other intervertebral disc degeneration, lumbosacral region: Secondary | ICD-10-CM

## 2016-01-27 DIAGNOSIS — M545 Low back pain: Secondary | ICD-10-CM | POA: Diagnosis present

## 2016-01-27 DIAGNOSIS — G588 Other specified mononeuropathies: Secondary | ICD-10-CM

## 2016-01-27 MED ORDER — ORPHENADRINE CITRATE ER 100 MG PO TB12: ORAL_TABLET | ORAL | Status: DC

## 2016-01-27 MED ORDER — CYCLOBENZAPRINE HCL 10 MG PO TABS: ORAL_TABLET | ORAL | Status: DC

## 2016-01-27 MED ORDER — GABAPENTIN 300 MG PO CAPS: ORAL_CAPSULE | ORAL | Status: DC

## 2016-01-27 MED ORDER — TRAMADOL HCL 50 MG PO TABS: ORAL_TABLET | ORAL | Status: DC

## 2016-01-27 NOTE — Patient Instructions (Addendum)
PLAN   Continue present medications  Neurontin and tramadol.   We will replace Norflex with Flexeril CAUTION  Flexeril can cause excessive drowsiness and confusion. Exercise extreme caution when taking Flexeril as well as other medications Mobic can elevate your blood pressure as well as cause damage to your liver and kidney and stomach please discuss Mobic with Carrie Alvarez  F/U PCP Carrie Alvarez for evaliation of BP and general medical condition.    F/U surgical evaluation. Patient without plans for surgery at this time  F/U neurological evaluation. May consider [facility] EMG studies and other studies  F/U oncological evaluation. Patient will follow-up with Carrie Alvarez as planned  May consider radiofrequency rhizolysis or intraspinal procedures pending response to present treatment and F/U evaluation   Patient to call Pain Management Center should patient have concerns prior to scheduled return appointment

## 2016-01-27 NOTE — Progress Notes (Signed)
Patient here for post procedure evaluation and medication management.  Patient states that she received a letter from her insurance that they will no longer cover her Norflex 100 mg tablets.  Patients BP is elevated this a.m. But did not take her BP medicine this morning.   Safety precautions to be maintained throughout the outpatient stay will include: orient to surroundings, keep bed in low position, maintain call bell within reach at all times, provide assistance with transfer out of bed and ambulation.

## 2016-01-27 NOTE — Progress Notes (Signed)
   Subjective:    Patient ID: Carrie Alvarez, female    DOB: [birth year], 65 y.o.   MRN: [id number]  HPI  The patient is a 65 year old female who returns to pain management for further evaluation and treatment of pain involving the lower back and lower extremity region with mid back pain is well. The patient admits to significant muscle spasms occurring in the lower back and mid back region and states that she had improvement of lower extremity pain following previous lumbosacral selective nerve root block. We discussed patient's condition and decision has been made to replace Norflex with Flexeril. We will observe response to the medication change and will consider modifications of treatment regimen pending follow-up evaluation. We'll continue Neurontin and tramadol as prescribed. All agreed to suggested treatment plan. The patient denies trauma change in events of daily living the call significant change in symptomatology.  Review of Systems     Objective:   Physical Exam  There was tenderness of the paraspinal muscular treat and cervical region cervical facet region of minimal degree with minimal tenderness over the splenius capitis and occipitalis region. Patient was with unremarkable Spurling's maneuver and there was minimal tenderness of the acromioclavicular and glenohumeral joint regions. The patient was with no increased pain with Tinel and Phalen's maneuver. Palpation over the thoracic region was with evidence of muscle spasms of the thoracic paraspinal musculature region without crepitus of the thoracic region noted. Palpation over the lumbar paraspinal musculatures and lumbar facet region was associated with moderate discomfort with moderate muscle spasms noted. Palpation of the PSIS and PII S region reproduced moderate discomfort with moderate tenderness of the greater trochanteric region iliotibial band region. Straight leg raise was tolerates approximately 30 without increased  pain with dorsiflexion noted. There was negative clonus negative Homans. Abdomen nontender with no costovertebral tenderness noted.       Assessment & Plan:      Degenerative disc disease lumbar spine. Multilevel degenerative changes lumbar spine.  Lumbar facet syndrome  Lumbar stenosis     PLAN   Continue present medications  Neurontin and tramadol.   We will replace Norflex with Flexeril CAUTION  Flexeril can cause excessive drowsiness and confusion. Exercise extreme caution when taking Flexeril as well as other medications Mobic can elevate your blood pressure as well as cause damage to your liver and kidney and stomach please discuss Mobic with Carrie Alvarez  F/U PCP Carrie Alvarez for evaliation of BP and general medical condition.    F/U surgical evaluation. Patient without plans for surgery at this time  F/U neurological evaluation. May consider PNCV EMG studies and other studies  F/U oncological evaluation. Patient will follow-up with Carrie Alvarez as planned  May consider radiofrequency rhizolysis or intraspinal procedures pending response to present treatment and F/U evaluation   Patient to call Pain Management Center should patient have concerns prior to scheduled return appointment

## 2016-01-28 ENCOUNTER — Ambulatory Visit: Payer: Medicare Other | Admitting: Pain Medicine

## 2016-02-24 ENCOUNTER — Ambulatory Visit: Payer: Medicare Other | Attending: Pain Medicine | Admitting: Pain Medicine

## 2016-02-24 ENCOUNTER — Encounter: Payer: Self-pay | Admitting: Pain Medicine

## 2016-02-24 VITALS — BP 111/88 | HR 74 | Temp 97.9°F | Resp 16 | Ht 63.0 in | Wt 180.0 lb

## 2016-02-24 DIAGNOSIS — M5136 Other intervertebral disc degeneration, lumbar region: Secondary | ICD-10-CM | POA: Diagnosis not present

## 2016-02-24 DIAGNOSIS — M4806 Spinal stenosis, lumbar region: Secondary | ICD-10-CM | POA: Insufficient documentation

## 2016-02-24 DIAGNOSIS — M47816 Spondylosis without myelopathy or radiculopathy, lumbar region: Secondary | ICD-10-CM | POA: Diagnosis not present

## 2016-02-24 DIAGNOSIS — M62830 Muscle spasm of back: Secondary | ICD-10-CM | POA: Insufficient documentation

## 2016-02-24 DIAGNOSIS — M533 Sacrococcygeal disorders, not elsewhere classified: Secondary | ICD-10-CM | POA: Diagnosis not present

## 2016-02-24 DIAGNOSIS — M545 Low back pain: Secondary | ICD-10-CM | POA: Diagnosis present

## 2016-02-24 DIAGNOSIS — M47817 Spondylosis without myelopathy or radiculopathy, lumbosacral region: Secondary | ICD-10-CM | POA: Diagnosis not present

## 2016-02-24 DIAGNOSIS — M546 Pain in thoracic spine: Secondary | ICD-10-CM | POA: Diagnosis present

## 2016-02-24 DIAGNOSIS — M5137 Other intervertebral disc degeneration, lumbosacral region: Secondary | ICD-10-CM

## 2016-02-24 DIAGNOSIS — M5416 Radiculopathy, lumbar region: Secondary | ICD-10-CM | POA: Diagnosis not present

## 2016-02-24 DIAGNOSIS — M791 Myalgia: Secondary | ICD-10-CM | POA: Diagnosis not present

## 2016-02-24 MED ORDER — CYCLOBENZAPRINE HCL 10 MG PO TABS: ORAL_TABLET | ORAL | 0 refills | Status: DC

## 2016-02-24 MED ORDER — GABAPENTIN 300 MG PO CAPS: ORAL_CAPSULE | ORAL | 0 refills | Status: DC

## 2016-02-24 MED ORDER — TRAMADOL HCL 50 MG PO TABS: ORAL_TABLET | ORAL | 0 refills | Status: DC

## 2016-02-24 NOTE — Progress Notes (Signed)
Patient here for medication management Safety precautions to be maintained throughout the outpatient stay will include: orient to surroundings, keep bed in low position, maintain call bell within reach at all times, provide assistance with transfer out of bed and ambulation.  

## 2016-02-24 NOTE — Patient Instructions (Addendum)
PLAN   Continue present medications  Neurontin Flexeril and tramadol. CAUTION  Medications can cause excessive drowsiness and confusion. Exercise extreme caution when taking Flexeril as well as other medications Mobic can elevate your blood pressure as well as cause damage to your liver and kidney and stomach please discuss Mobic with Dr. Verlon Setting of nerves to the sacroiliac joint to be performed at time of return appointment  F/U PCP Dr. Venia Minks for evaliation of BP and general medical condition.    F/U surgical evaluation. Patient without plans for surgery at this time  F/U neurological evaluation. May consider [facility] EMG studies and other studies  F/U oncological evaluation. Patient will follow-up with Dr. Grayland Ormond as planned  May consider radiofrequency rhizolysis or intraspinal procedures pending response to present treatment and F/U evaluation   Patient to call [facility] Management Center should patient have concerns prior to scheduled return appointmentSelective Nerve Root Block Patient Information  Description: Specific nerve roots exit the spinal canal and these nerves can be compressed and inflamed by a bulging disc and bone spurs.  By injecting steroids on the nerve root, we can potentially decrease the inflammation surrounding these nerves, which often leads to decreased pain.  Also, by injecting local anesthesia on the nerve root, this can provide [place] helpful information to give to your referring doctor if it decreases your pain.  Selective nerve root blocks can be done along the spine from the neck to the low back depending on the location of your pain.   After numbing the skin with local anesthesia, a small needle is passed to the nerve root and the position of the needle is verified using x-ray pictures.  After the needle is in correct position, we then deposit the medication.  You may experience a pressure sensation while this is being done.  The entire block usually lasts less  than 15 minutes.  Conditions that may be treated with selective nerve root blocks:  Low back and leg pain  Spinal stenosis  Diagnostic block prior to potential surgery  Neck and arm pain  Post laminectomy syndrome  Preparation for the injection:  1. Do not eat any solid food or dairy products within 8 hours of your appointment. 2. You may drink clear liquids up to 3 hours before an appointment.  Clear liquids include water, black coffee, juice or soda.  No milk or cream please. 3. You may take your regular medications, including pain medications, with a sip of water before your appointment.  Diabetics should hold regular insulin (if taken separately) and take 1/2 normal NPH dose the morning of the procedure.  Carry some sugar containing items with you to your appointment. 4. A driver must accompany you and be prepared to drive you home after your procedure. 5. Bring all your current medications with you. 6. An IV may be inserted and sedation may be given at the discretion of the physician. 7. A blood pressure cuff, EKG, and other monitors will often be applied during the procedure.  Some patients may need to have extra oxygen administered for a short period. 8. You will be asked to provide medical information, including allergies, prior to the procedure.  We must know immediately if you are taking blood  Thinners (like Coumadin) or if you are allergic to IV iodine contrast (dye).  Possible side-effects: All are usually temporary  Bleeding from needle site  Light headedness  Numbness and tingling  Decreased blood pressure  Weakness in arms/legs  Pressure sensation  in back/neck  Pain at injection site (several days)  Possible complications: All are extremely rare  Infection  Nerve injury  Spinal headache (a headache wore with upright position)  Call if you experience:  Fever/chills associated with headache or increased back/neck pain  Headache worsened by an  upright position  New onset weakness or numbness of an extremity below the injection site  Hives or difficulty breathing (go to the emergency room)  Inflammation or drainage at the injection site(s)  Severe back/neck pain greater than usual  New symptoms which are concerning to you  Please note:  Although the local anesthetic injected can often make your back or neck feel good for several hours after the injection the pain will likely return.  It takes 3-5 days for steroids to work on the nerve root. You may not notice any pain relief for at least one week.  If effective, we will often do a series of 3 injections spaced 3-6 weeks apart to maximally decrease your pain.    If you have any questions, please call [phone] [facility] ClinicGENERAL RISKS AND COMPLICATIONS  What are the risk, side effects and possible complications? Generally speaking, most procedures are safe.  However, with any procedure there are risks, side effects, and the possibility of complications.  The risks and complications are dependent upon the sites that are lesioned, or the type of nerve block to be performed.  The closer the procedure is to the spine, the more serious the risks are.  Great care is taken when placing the radio frequency needles, block needles or lesioning probes, but sometimes complications can occur. 1. Infection: Any time there is an injection through the skin, there is a risk of infection.  This is why sterile conditions are used for these blocks.  There are four possible types of infection. 1. Localized skin infection. 2. Central Nervous System Infection-This can be in the form of Meningitis, which can be deadly. 3. Epidural Infections-This can be in the form of an epidural abscess, which can cause pressure inside of the spine, causing compression of the spinal cord with subsequent paralysis. This would require an emergency surgery to decompress, and there are no  guarantees that the patient would recover from the paralysis. 4. Discitis-This is an infection of the intervertebral discs.  It occurs in about 1% of discography procedures.  It is difficult to treat and it may lead to surgery.        2. Pain: the needles have to go through skin and soft tissues, will cause soreness.       3. Damage to internal structures:  The nerves to be lesioned may be near blood vessels or    other nerves which can be potentially damaged.       4. Bleeding: Bleeding is more common if the patient is taking blood thinners such as  aspirin, Coumadin, Ticiid, Plavix, etc., or if he/she have some genetic predisposition  such as hemophilia. Bleeding into the spinal canal can cause compression of the spinal  cord with subsequent paralysis.  This would require an emergency surgery to  decompress and there are no guarantees that the patient would recover from the  paralysis.       5. Pneumothorax:  Puncturing of a lung is a possibility, every time a needle is introduced in  the area of the chest or upper back.  Pneumothorax refers to free air around the  collapsed lung(s), inside of the thoracic cavity (  chest cavity).  Another two possible  complications related to a similar event would include: Hemothorax and Chylothorax.   These are variations of the Pneumothorax, where instead of air around the collapsed  lung(s), you may have blood or chyle, respectively.       6. Spinal headaches: They may occur with any procedures in the area of the spine.       7. Persistent CSF (Cerebro-Spinal Fluid) leakage: This is a rare problem, but may occur  with prolonged intrathecal or epidural catheters either due to the formation of a fistulous  track or a dural tear.       8. Nerve damage: By working so close to the spinal cord, there is always a possibility of  nerve damage, which could be as serious as a permanent spinal cord injury with  paralysis.       9. Death:  Although rare, severe deadly allergic  reactions known as "Anaphylactic  reaction" can occur to any of the medications used.      10. Worsening of the symptoms:  We can always make thing worse.  What are the chances of something like this happening? Chances of any of this occuring are extremely low.  By statistics, you have more of a chance of getting killed in a motor vehicle accident: while driving to the hospital than any of the above occurring .  Nevertheless, you should be aware that they are possibilities.  In general, it is similar to taking a shower.  Everybody knows that you can slip, hit your head and get killed.  Does that mean that you should not shower again?  Nevertheless always keep in mind that statistics do not mean anything if you happen to be on the wrong side of them.  Even if a procedure has a 1 (one) in a 1,000,000 (million) chance of going wrong, it you happen to be that one..Also, keep in mind that by statistics, you have more of a chance of having something go wrong when taking medications.  Who should not have this procedure? If you are on a blood thinning medication (e.g. Coumadin, Plavix, see list of "Blood Thinners"), or if you have an active infection going on, you should not have the procedure.  If you are taking any blood thinners, please inform your physician.  How should I prepare for this procedure?  Do not eat or drink anything at least six hours prior to the procedure.  Bring a driver with you .  It cannot be a taxi.  Come accompanied by an adult that can drive you back, and that is strong enough to help you if your legs get weak or numb from the local anesthetic.  Take all of your medicines the morning of the procedure with just enough water to swallow them.  If you have diabetes, make sure that you are scheduled to have your procedure done first thing in the morning, whenever possible.  If you have diabetes, take only half of your insulin dose and notify our nurse that you have done so as soon  as you arrive at the clinic.  If you are diabetic, but only take blood sugar pills (oral hypoglycemic), then do not take them on the morning of your procedure.  You may take them after you have had the procedure.  Do not take aspirin or any aspirin-containing medications, at least eleven (11) days prior to the procedure.  They may prolong bleeding.  Wear loose fitting clothing that may  be easy to take off and that you would not mind if it got stained with Betadine or blood.  Do not wear any jewelry or perfume  Remove any nail coloring.  It will interfere with some of our monitoring equipment.  NOTE: Remember that this is not meant to be interpreted as a complete list of all possible complications.  Unforeseen problems may occur.  BLOOD THINNERS The following drugs contain aspirin or other products, which can cause increased bleeding during surgery and should not be taken for 2 weeks prior to and 1 week after surgery.  If you should need take something for relief of minor pain, you may take acetaminophen which is found in Tylenol,m Datril, Anacin-3 and Panadol. It is not blood thinner. The products listed below are.  Do not take any of the products listed below in addition to any listed on your instruction sheet.  A.P.C or A.P.C with Codeine Codeine Phosphate Capsules #3 Ibuprofen Ridaura  ABC compound Congesprin Imuran rimadil  Advil Cope Indocin Robaxisal  Alka-Seltzer Effervescent Pain Reliever and Antacid Coricidin or Coricidin-D  Indomethacin Rufen  Alka-Seltzer plus Cold Medicine Cosprin Ketoprofen S-A-C Tablets  Anacin Analgesic Tablets or Capsules Coumadin Korlgesic Salflex  Anacin Extra Strength Analgesic tablets or capsules CP-2 Tablets Lanoril Salicylate  Anaprox Cuprimine Capsules Levenox Salocol  Anexsia-D Dalteparin Magan Salsalate  Anodynos Darvon compound Magnesium Salicylate Sine-off  Ansaid Dasin Capsules Magsal Sodium Salicylate  Anturane Depen Capsules Marnal Soma   APF Arthritis pain formula Dewitt's Pills Measurin Stanback  Argesic Dia-Gesic Meclofenamic Sulfinpyrazone  Arthritis Bayer Timed Release Aspirin Diclofenac Meclomen Sulindac  Arthritis pain formula Anacin Dicumarol Medipren Supac  Analgesic (Safety coated) Arthralgen Diffunasal Mefanamic Suprofen  Arthritis Strength Bufferin Dihydrocodeine Mepro Compound Suprol  Arthropan liquid Dopirydamole Methcarbomol with Aspirin Synalgos  ASA tablets/Enseals Disalcid Micrainin Tagament  Ascriptin Doan's Midol Talwin  Ascriptin A/D Dolene Mobidin Tanderil  Ascriptin Extra Strength Dolobid Moblgesic Ticlid  Ascriptin with Codeine Doloprin or Doloprin with Codeine Momentum Tolectin  Asperbuf Duoprin Mono-gesic Trendar  Aspergum Duradyne Motrin or Motrin IB Triminicin  Aspirin plain, buffered or enteric coated Durasal Myochrisine Trigesic  Aspirin Suppositories Easprin Nalfon Trillsate  Aspirin with Codeine Ecotrin Regular or Extra Strength Naprosyn Uracel  Atromid-S Efficin Naproxen Ursinus  Auranofin Capsules Elmiron Neocylate Vanquish  Axotal Emagrin Norgesic Verin  Azathioprine Empirin or Empirin with Codeine Normiflo Vitamin E  Azolid Emprazil Nuprin Voltaren  Bayer Aspirin plain, buffered or children's or timed BC Tablets or powders Encaprin Orgaran Warfarin Sodium  Buff-a-Comp Enoxaparin Orudis Zorpin  Buff-a-Comp with Codeine Equegesic Os-Cal-Gesic   Buffaprin Excedrin plain, buffered or Extra Strength Oxalid   Bufferin Arthritis Strength Feldene Oxphenbutazone   Bufferin plain or Extra Strength Feldene Capsules Oxycodone with Aspirin   Bufferin with Codeine Fenoprofen Fenoprofen Pabalate or Pabalate-SF   Buffets II Flogesic Panagesic   Buffinol plain or Extra Strength Florinal or Florinal with Codeine Panwarfarin   Buf-Tabs Flurbiprofen Penicillamine   Butalbital Compound Four-way cold tablets Penicillin   Butazolidin Fragmin Pepto-Bismol   Carbenicillin Geminisyn Percodan   Carna  Arthritis Reliever Geopen Persantine   Carprofen Gold's salt Persistin   Chloramphenicol Goody's Phenylbutazone   Chloromycetin Haltrain Piroxlcam   Clmetidine heparin Plaquenil   Cllnoril Hyco-pap Ponstel   Clofibrate Hydroxy chloroquine Propoxyphen         Before stopping any of these medications, be sure to consult the physician who ordered them.  Some, such as Coumadin (Warfarin) are ordered to prevent or treat serious conditions such as "  deep thrombosis", "pumonary embolisms", and other heart problems.  The amount of time that you may need off of the medication may also vary with the medication and the reason for which you were taking it.  If you are taking any of these medications, please make sure you notify your pain physician before you undergo any procedures.         Sacroiliac (SI) Joint Injection Patient Information  Description: The sacroiliac joint connects the scrum (very low back and tailbone) to the ilium (a pelvic bone which also forms half of the hip joint).  Normally this joint experiences very little motion.  When this joint becomes inflamed or unstable low back and or hip and pelvis pain may result.  Injection of this joint with local anesthetics (numbing medicines) and steroids can provide diagnostic information and reduce pain.  This injection is performed with the aid of x-ray guidance into the tailbone area while you are lying on your stomach.   You may experience an electrical sensation down the leg while this is being done.  You may also experience numbness.  We also may ask if we are reproducing your normal pain during the injection.  Conditions which may be treated SI injection:   Low back, buttock, hip or leg pain  Preparation for the Injection:  9. Do not eat any solid food or dairy products within 8 hours of your appointment.  10. You may drink clear liquids up to 3 hours before appointment.  Clear liquids include water, black coffee, juice or soda.   No milk or cream please. 32. You may take your regular medications, including pain medications with a sip of water before your appointment.  Diabetics should hold regular insulin (if take separately) and take 1/2 normal NPH dose the morning of the procedure.  Carry some sugar containing items with you to your appointment. 12. A driver must accompany you and be prepared to drive you home after your procedure. 71. Bring all of your current medications with you. 14. An IV may be inserted and sedation may be given at the discretion of the physician. 15. A blood pressure cuff, EKG and other monitors will often be applied during the procedure.  Some patients may need to have extra oxygen administered for a short period.  33. You will be asked to provide medical information, including your allergies, prior to the procedure.  We must know immediately if you are taking blood thinners (like Coumadin/Warfarin) or if you are allergic to IV iodine contrast (dye).  We must know if you could possible be pregnant.  Possible side effects:   Bleeding from needle site  Infection (rare, may require surgery)  Nerve injury (rare)  Numbness & tingling (temporary)  A brief convulsion or seizure  Light-headedness (temporary)  Pain at injection site (several days)  Decreased blood pressure (temporary)  Weakness in the leg (temporary)   Call if you experience:   New onset weakness or numbness of an extremity below the injection site that last more than 8 hours.  Hives or difficulty breathing ( go to the emergency room)  Inflammation or drainage at the injection site  Any new symptoms which are concerning to you  Please note:  Although the local anesthetic injected can often make your back/ hip/ buttock/ leg feel good for several hours after the injections, the pain will likely return.  It takes 3-7 days for steroids to work in the sacroiliac area.  You may not notice any pain relief  for at least that  one week.  If effective, we will often do a series of three injections spaced 3-6 weeks apart to maximally decrease your pain.  After the initial series, we generally will wait some months before a repeat injection of the same type.  If you have any questions, please call [phone] [facility]

## 2016-02-24 NOTE — Progress Notes (Signed)
    The patient is a 65 year old female who returns to pain management for further evaluation and treatment of pain involving the lower back and lower extremity region with mid back pain of lesser degree. The patient states the pain is improved significantly with prior treatment performed in pain management Center. The patient states that at present time the pain appears to be across the buttocks radiating from the lumbar region and continuing to the buttocks on the left as well as on the right. The pain is aggravated by standing walking twisting turning maneuvers and becomes more intense as the day progresses. The patient denies any trauma change in events of daily living the call significant change in symptomatology. We discussed patient's condition and we will consider patient for block of nerves to the sacroiliac joint at time return appointment. The patient will continue Neurontin Flexeril and tramadol as prescribed. All agreed to suggested treatment plan.     Physical examination  There was tenderness to palpation of the splenius capitis and occipitalis region a mild degree. Palpation over the region of the cervical and thoracic facet regions reproduce mild discomfort with moderate discomfort involving the lower thoracic paraspinal muscular region. There was moderate muscle spasm of the lower thoracic paraspinal musculature region. Palpation of the acromioclavicular and glenohumeral joint regions reproduce mild discomfort and patient appeared to be with unremarkable drop test and was with unremarkable Spurling's maneuver. There was tends to palpation over the lumbar paraspinal musculatures and lumbar facet region a moderate degree with severe tenderness to palpation of the PSIS and PII S region. Patient appeared to be with positive Patrick's maneuver as well. Palpation of the greater trochanteric region iliotibial band region was with mild discomfort. Straight leg raise was tolerates +30 without  increased pain with dorsiflexion noted. There was negative clonus negative Homans. Abdomen nontender with no costovertebral tenderness noted.     Assessment   Sacroiliac joint dysfunction  Degenerative disc disease lumbar spine. Multilevel degenerative changes lumbar spine.  Lumbar facet syndrome  Lumbar stenosis     PLAN  Continue present medications  Neurontin Flexeril and tramadol. CAUTION  Medications can cause excessive drowsiness and confusion. Exercise extreme caution when taking Flexeril as well as other medications Mobic can elevate your blood pressure as well as cause damage to your liver and kidney and stomach please discuss Mobic with Dr. Verlon Setting of nerves to the sacroiliac joint to be performed at time of return appointment  F/U PCP Dr. Venia Minks for evaliation of BP and general medical condition.    F/U surgical evaluation. Patient without plans for surgery at this time  F/U neurological evaluation. May consider [facility] EMG studies and other studies  F/U oncological evaluation. Patient will follow-up with Dr. Grayland Ormond as planned  May consider radiofrequency rhizolysis or intraspinal procedures pending response to present treatment and F/U evaluation   Patient to call Pain Management Center should patient have concerns prior to scheduled return appointment

## 2016-03-04 ENCOUNTER — Ambulatory Visit: Payer: Medicare Other | Attending: Pain Medicine | Admitting: Pain Medicine

## 2016-03-04 VITALS — BP 156/106 | HR 70 | Temp 97.8°F | Resp 17 | Ht 63.0 in | Wt 180.0 lb

## 2016-03-04 DIAGNOSIS — M545 Low back pain: Secondary | ICD-10-CM | POA: Diagnosis not present

## 2016-03-04 DIAGNOSIS — M533 Sacrococcygeal disorders, not elsewhere classified: Secondary | ICD-10-CM | POA: Insufficient documentation

## 2016-03-04 DIAGNOSIS — M5136 Other intervertebral disc degeneration, lumbar region: Secondary | ICD-10-CM

## 2016-03-04 DIAGNOSIS — M47817 Spondylosis without myelopathy or radiculopathy, lumbosacral region: Secondary | ICD-10-CM

## 2016-03-04 DIAGNOSIS — M5137 Other intervertebral disc degeneration, lumbosacral region: Secondary | ICD-10-CM

## 2016-03-04 MED ORDER — LACTATED RINGERS IV SOLN: 1000.0000 mL | INTRAVENOUS | Status: DC

## 2016-03-04 MED ORDER — MIDAZOLAM HCL 5 MG/5ML IJ SOLN
5.0000 mg | Freq: Once | INTRAMUSCULAR | Status: AC
  Administered 2016-03-04: 2 mg via INTRAVENOUS
  Filled 2016-03-04: qty 5

## 2016-03-04 MED ORDER — CEFAZOLIN SODIUM 1 G IJ SOLR
INTRAMUSCULAR | Status: AC
  Administered 2016-03-04: 1 g via INTRAVENOUS
  Filled 2016-03-04: qty 10

## 2016-03-04 MED ORDER — TRIAMCINOLONE ACETONIDE 40 MG/ML IJ SUSP
40.0000 mg | Freq: Once | INTRAMUSCULAR | Status: AC
  Administered 2016-03-04: 40 mg
  Filled 2016-03-04: qty 1

## 2016-03-04 MED ORDER — BUPIVACAINE HCL (PF) 0.25 % IJ SOLN
30.0000 mL | Freq: Once | INTRAMUSCULAR | Status: AC
  Administered 2016-03-04: 30 mL
  Filled 2016-03-04: qty 30

## 2016-03-04 MED ORDER — FENTANYL CITRATE (PF) 100 MCG/2ML IJ SOLN
100.0000 ug | Freq: Once | INTRAMUSCULAR | Status: AC
  Administered 2016-03-04: 100 ug via INTRAVENOUS
  Filled 2016-03-04: qty 2

## 2016-03-04 MED ORDER — CEFAZOLIN IN D5W 1 GM/50ML IV SOLN: 1.0000 g | Freq: Once | INTRAVENOUS | Status: DC

## 2016-03-04 MED ORDER — ORPHENADRINE CITRATE 30 MG/ML IJ SOLN
60.0000 mg | Freq: Once | INTRAMUSCULAR | Status: AC
  Administered 2016-03-04: 60 mg via INTRAMUSCULAR
  Filled 2016-03-04: qty 2

## 2016-03-04 MED ORDER — CEFUROXIME AXETIL 250 MG PO TABS: 250.0000 mg | ORAL_TABLET | Freq: Two times a day (BID) | ORAL | 0 refills | Status: DC

## 2016-03-04 NOTE — Patient Instructions (Addendum)
PLAN   Continue present medications  Neurontin and tramadol  CAUTION Mobic can elevate your blood pressure as well as cause damage to your liver and kidney and stomach Please discuss Mobic with Dr. Venia Minks  Caution Neurontin and tramadol can cause respiratory depression, confusion, and other side effects as previously discussed Please obtain Ceftin antibiotic today and begin taking Ceftin antibiotic today as prescribed  F/U PCP Dr. Venia Minks or other PCP this week for evaliation of BP and general medical condition. .  .  F/U surgical evaluation. Patient without plans for surgery at this time  F/U neurological evaluation. May consider PNCV EMG studies and other studies  F/U oncological evaluation.  May consider radiofrequency rhizolysis or intraspinal procedures pending response to present treatment and F/U evaluation   Patient to call [facility] Center should patient have concerns prior to scheduled return appointment  Sacroiliac (SI) Joint Injection Patient Information  Description: The sacroiliac joint connects the scrum (very low back and tailbone) to the ilium (a pelvic bone which also forms half of the hip joint).  Normally this joint experiences very little motion.  When this joint becomes inflamed or unstable low back and or hip and pelvis pain may result.  Injection of this joint with local anesthetics (numbing medicines) and steroids can provide diagnostic information and reduce pain.  This injection is performed with the aid of x-ray guidance into the tailbone area while you are lying on your stomach.   You may experience an electrical sensation down the leg while this is being done.  You may also experience numbness.  We also may ask if we are reproducing your normal pain during the injection.  Conditions which may be treated SI injection:   Low back, buttock, hip or leg pain  Preparation for the Injection:  1. Do not eat any solid food or dairy products within 8 hours  of your appointment.  2. You may drink clear liquids up to 3 hours before appointment.  Clear liquids include water, black coffee, juice or soda.  No milk or cream please. 3. You may take your regular medications, including pain medications with a sip of water before your appointment.  Diabetics should hold regular insulin (if take separately) and take 1/2 normal NPH dose the morning of the procedure.  Carry some sugar containing items with you to your appointment. 4. A driver must accompany you and be prepared to drive you home after your procedure. 5. Bring all of your current medications with you. 6. An IV may be inserted and sedation may be given at the discretion of the physician. 7. A blood pressure cuff, EKG and other monitors will often be applied during the procedure.  Some patients may need to have extra oxygen administered for a short period.  8. You will be asked to provide medical information, including your allergies, prior to the procedure.  We must know immediately if you are taking blood thinners (like Coumadin/Warfarin) or if you are allergic to IV iodine contrast (dye).  We must know if you could possible be pregnant.  Possible side effects:   Bleeding from needle site  Infection (rare, may require surgery)  Nerve injury (rare)  Numbness & tingling (temporary)  A brief convulsion or seizure  Light-headedness (temporary)  Pain at injection site (several days)  Decreased blood pressure (temporary)  Weakness in the leg (temporary)   Call if you experience:   New onset weakness or numbness of an extremity below the injection site that last  more than 8 hours.  Hives or difficulty breathing ( go to the emergency room)  Inflammation or drainage at the injection site  Any new symptoms which are concerning to you  Please note:  Although the local anesthetic injected can often make your back/ hip/ buttock/ leg feel good for several hours after the injections, the  pain will likely return.  It takes 3-7 days for steroids to work in the sacroiliac area.  You may not notice any pain relief for at least that one week.  If effective, we will often do a series of three injections spaced 3-6 weeks apart to maximally decrease your pain.  After the initial series, we generally will wait some months before a repeat injection of the same type.  If you have any questions, please call [phone] [facility]   Pain Management Discharge Instructions  General Discharge Instructions :  If you need to reach your doctor call: Monday-Friday 8:00 am - 4:00 pm at [phone] or toll free [phone].  After clinic hours [phone] to have operator reach doctor.  Bring all of your medication bottles to all your appointments in the pain clinic.  To cancel or reschedule your appointment with Pain Management please remember to call 24 hours in advance to avoid a fee.  Refer to the educational materials which you have been given on: General Risks, I had my Procedure. Discharge Instructions, Post Sedation.  Post Procedure Instructions:  The drugs you were given will stay in your system until tomorrow, so for the next 24 hours you should not drive, make any legal decisions or drink any alcoholic beverages.  You may eat anything you prefer, but it is better to start with liquids then soups and crackers, and gradually work up to solid foods.  Please notify your doctor immediately if you have any unusual bleeding, trouble breathing or pain that is not related to your normal pain.  Depending on the type of procedure that was done, some parts of your body may feel week and/or numb.  This usually clears up by tonight or the next day.  Walk with the use of an assistive device or accompanied by an adult for the 24 hours.  You may use ice on the affected area for the first 24 hours.  Put ice in a Ziploc bag and cover with a towel and place  against area 15 minutes on 15 minutes off.  You may switch to heat after 24 hours.

## 2016-03-04 NOTE — Progress Notes (Signed)
Safety precautions to be maintained throughout the outpatient stay will include: orient to surroundings, keep bed in low position, maintain call bell within reach at all times, provide assistance with transfer out of bed and ambulation.  

## 2016-03-04 NOTE — Progress Notes (Signed)
Dr. Primus Bravo aware of elevated blood pressures.

## 2016-03-04 NOTE — Progress Notes (Signed)
PROCEDURE:  Block of nerves to the sacroiliac joint.   NOTE:  The patient is a 65 y.o. female who returns to the [facility] for further evaluation and treatment of pain involving the lower back and lower extremity region with pain in the region of the buttocks as well. Prior studies reveal multilevel degenerative changes of the lumbar spine .  The patient is with reproduction of severe pain with palpation over the PSIS and PII S regions and is with positive Patrick's maneuver There is concern regarding a significant component of the patient's pain being due to sacroiliac joint dysfunction The risks, benefits, expectations of the procedure have been discussed and explained to the patient who is understanding and willing to proceed with interventional treatment in attempt to decrease severity of patient's symptoms, minimize the risk of medication escalation and  hopefully retard the progression of the patient's symptoms. We will proceed with what is felt to be a medically necessary procedure, block of nerves to the sacroiliac joint.   DESCRIPTION OF PROCEDURE:  Block of nerves to the sacroiliac joint.   The patient was taken to the fluoroscopy suite. With the patient in the prone position with EKG, blood pressure, pulse, capnography, and pulse oximetry monitoring, IV Versed, IV fentanyl conscious sedation, Betadine prep of proposed entry site was performed.   Block of nerves at the L5 vertebral body level.   With the patient in prone position, under fluoroscopic guidance, a 22 -gauge needle was inserted at the L5 vertebral body level on the left side. With 15 degrees oblique orientation a 22 -gauge needle was inserted in the region known as Burton's eye or eye of the Scotty dog. Following documentation of needle placement in the area of Burton's eye or eye of the Scotty dog under fluoroscopic guidance, needle placement was then accomplished at the sacral ala level on the left side.    Needle placement at the sacral ala.   With the patient in prone position under fluoroscopic guidance with AP view of the lumbosacral spine, a 22 -gauge needle was inserted in the region known as the sacral ala on the left side. Following documentation of needle placement on the left side under fluoroscopic guidance needle placement was then accomplished at the S1 foramen level.   Needle placement at the S1 foramen level.   With the patient in prone position under fluoroscopic guidance with AP view of the lumbosacral spine and cephalad orientation, a 22 -gauge needle was inserted at the superior and lateral border of the S1 foramen on the left side. Following documentation of needle placement at the S1 foramen level on the left side, needle placement was then accomplished at the S2 foramen level on the left side.   Needle placement at the S2 foramen level.   With the patient in prone position with AP view of the lumbosacral spine with cephalad orientation, a 22 - gauge needle was inserted at the superior and lateral border of the S2 foramen under fluoroscopic guidance on the left side. Following needle placement at the L5 vertebral body level, sacral ala, S1 foramen and S2 foramen on the left side, needle placement was verified on lateral view under fluoroscopic guidance.  Following needle placement documentation on lateral view, each needle was injected with 1 mL of 0.25% bupivacaine and Kenalog.   BLOCK OF THE NERVES TO SACROILIAC JOINT ON THE RIGHT SIDE The procedure was performed on the right side at the same levels as was  performed on the left side and utilizing the same technique as on the left side and was performed under fluoroscopic guidance as on the left side   A total of 10mg  of Kenalog was utilized for the procedure.   PLAN:  1. Medications: The patient will continue presently prescribed medications. Neurontin Flexeril and tramadol 2. The patient will be considered for  modification of treatment regimen pending response to the procedure performed on today's visit.  3. The patient is to follow-up with primary care physician for evaluation of blood pressure and general medical condition following the procedure performed on today's visit.  4. Surgical evaluation as discussed.  5. Neurological evaluation as discussed.  6. The patient may be a candidate for radiofrequency procedures, implantation devices and other treatment pending response to treatment performed on today's visit and follow-up evaluation.  7. The patient has been advised to adhere to proper body mechanics and to avoid activities which may exacerbate the patient's symptoms.   Return appointment to Pain Management Center as scheduled.

## 2016-03-05 ENCOUNTER — Telehealth: Payer: Self-pay

## 2016-03-05 NOTE — Telephone Encounter (Signed)
Denies any needs or concerns at this time

## 2016-03-11 ENCOUNTER — Telehealth: Payer: Self-pay | Admitting: *Deleted

## 2016-03-12 ENCOUNTER — Other Ambulatory Visit: Payer: Self-pay | Admitting: Family Medicine

## 2016-03-12 DIAGNOSIS — M5136 Other intervertebral disc degeneration, lumbar region: Secondary | ICD-10-CM

## 2016-03-12 DIAGNOSIS — F32A Depression, unspecified: Secondary | ICD-10-CM

## 2016-03-12 DIAGNOSIS — F329 Major depressive disorder, single episode, unspecified: Secondary | ICD-10-CM

## 2016-03-12 NOTE — Telephone Encounter (Signed)
Jenni's Pt.   Thanks,   -Celestine Bougie  

## 2016-03-18 DIAGNOSIS — H25813 Combined forms of age-related cataract, bilateral: Secondary | ICD-10-CM | POA: Diagnosis not present

## 2016-03-23 NOTE — Progress Notes (Signed)
[place]  [phone] Fax:(336) [phone]  ID: Norm Salt OB: 1951-04-01  MR#: [id number]  KDT#:[id number]  Patient Care Team: Mar Daring, PA-C as PCP - General (Family Medicine) [name], MD as Consulting Physician (Oncology) Robert Bellow, MD (General Surgery) Mayra Reel, NP as Nurse Practitioner (Oncology)  CHIEF COMPLAINT: Stage IIa ER+ left breast cancer with no breast lesion and axillary lymph node metastasis.  INTERVAL HISTORY: Patient returns to clinic today for routine evaluation. She appears depressed today with a flat affect.  She continues to have chronic pain and was seen by pain clinic earlier today. She is tolerating letrozole without significant side effects. She has no other neurologic complaints. She denies any recent fevers. She has no chest pain or shortness of breath. She denies any nausea, vomiting, constipation, or diarrhea.  She has no urinary complaints. Patient offers no further specific complaints today.    REVIEW OF SYSTEMS:   Review of Systems  Constitutional: Negative for fever, malaise/fatigue and weight loss.  Respiratory: Negative.  Negative for shortness of breath.   Cardiovascular: Negative.  Negative for chest pain and leg swelling.  Gastrointestinal: Negative.   Neurological: Positive for sensory change. Negative for dizziness, weakness and headaches.  Psychiatric/Behavioral: Positive for depression. The patient is not nervous/anxious.     As per HPI. Otherwise, a complete review of systems is negative.  PAST MEDICAL HISTORY: Past Medical History:  Diagnosis Date  . Allergy   . Anemia   . Back pain   . Breast cancer ([place]) 2015   LT LUMPECTOMY 12-2014 FOLLOWING CHEMO  . Carpal tunnel syndrome   . Depression   . GERD (gastroesophageal reflux disease)   . Hypertension   . Obesity   . Uterine cancer ([place]) 1994    PAST SURGICAL HISTORY: Past Surgical History:  Procedure  Laterality Date  . ABDOMINAL HYSTERECTOMY  1994   UTERINE CA  . AXILLARY LYMPH NODE BIOPSY Left 12/18/2014   Procedure: AXILLARY LYMPH NODE BIOPSY/;  Surgeon: Robert Bellow, MD;  Location: [facility] ORS;  Service: General;  Laterality: Left;  . AXILLARY LYMPH NODE DISSECTION Left 12/18/2014   Procedure: AXILLARY LYMPH NODE DISSECTION;  Surgeon: Robert Bellow, MD;  Location: [facility] ORS;  Service: General;  Laterality: Left;  . BREAST BIOPSY Left 05/2014   CORE - POS  . BREAST SURGERY    . medial branch block  11/06/2015   lumbar facet Dr. Primus Bravo  . SENTINEL NODE BIOPSY Left 12/18/2014   Procedure: SENTINEL NODE BIOPSY;  Surgeon: Robert Bellow, MD;  Location: [facility] ORS;  Service: General;  Laterality: Left;    FAMILY HISTORY Family History  Problem Relation Age of Onset  . Breast cancer Sister   . Colon cancer Mother   . Hypertension Mother   . Asthma Mother   . Cancer Father        ADVANCED DIRECTIVES:    HEALTH MAINTENANCE: Social History  Substance Use Topics  . Smoking status: Former Smoker    Packs/day: 0.50    Types: Cigarettes    Quit date: 07/18/2014  . Smokeless tobacco: Never Used     Comment: given smoking and pain information   . Alcohol use No    No Known Allergies  Current Outpatient Prescriptions  Medication Sig Dispense Refill  . amLODipine (NORVASC) 2.5 MG tablet TAKE ONE TABLET BY MOUTH ONCE DAILY 90 tablet 3  . cefUROXime (CEFTIN) 250 MG tablet Take 1 tablet (250 mg total) by  mouth 2 (two) times daily with a meal. 14 tablet 0  . cefUROXime (CEFTIN) 250 MG tablet Take 1 tablet (250 mg total) by mouth 2 (two) times daily with a meal. 14 tablet 0  . cholecalciferol (VITAMIN D) 1000 units tablet Take 1,000 Units by mouth daily.    . cyclobenzaprine (FLEXERIL) 10 MG tablet Limit one half to one tablet by mouth per day or twice per day if tolerated     NO NORFLEX 50 tablet 0  . fluticasone (FLONASE) 50 MCG/ACT nasal spray Place 1 spray into both nostrils as  needed for allergies or rhinitis.    Marland Kitchen gabapentin (NEURONTIN) 300 MG capsule Limit 1 tablet by mouth 2-4 times per day if tolerated 100 capsule 0  . letrozole (FEMARA) 2.5 MG tablet Take 1 tablet (2.5 mg total) by mouth daily. 90 tablet 3  . lidocaine-prilocaine (EMLA) cream Apply 1 application topically as needed. Apply to port then cover with saran wrap 1-2 hours prior to chemotherapy appointment 30 g 1  . loratadine (CLARITIN) 10 MG tablet Take 10 mg by mouth daily.    . meclizine (ANTIVERT) 12.5 MG tablet Take 1 tablet (12.5 mg total) by mouth every 6 (six) hours as needed for dizziness or nausea. 60 tablet 0  . meloxicam (MOBIC) 15 MG tablet TAKE ONE TABLET BY MOUTH ONCE DAILY 90 tablet 1  . orphenadrine (NORFLEX) 100 MG tablet Limit 1 tablet by mouth per day or twice per day if tolerated 60 tablet 0  . pyridOXINE (VITAMIN B-6) 100 MG tablet Take 100 mg by mouth daily.    . sertraline (ZOLOFT) 50 MG tablet TAKE FOUR TABLETS BY MOUTH AT BEDTIME 120 tablet 1  . traMADol (ULTRAM) 50 MG tablet Limit 1 tab by mouth 2-4 times per day if tolerated 100 tablet 0  . vitamin B-12 (CYANOCOBALAMIN) 500 MCG tablet Take 500 mcg by mouth daily.     Current Facility-Administered Medications  Medication Dose Route Frequency Provider Last Rate Last Dose  . bupivacaine (PF) (MARCAINE) 0.25 % injection 30 mL  30 mL Other Once [name], MD      . bupivacaine (PF) (MARCAINE) 0.25 % injection 30 mL  30 mL Other Once [name], MD      . ceFAZolin (ANCEF) IVPB 1 g/50 mL premix  1 g Intravenous Once [name], MD      . ceFAZolin (ANCEF) IVPB 1 g/50 mL premix  1 g Intravenous Once [name], MD      . ceFAZolin (ANCEF) IVPB 1 g/50 mL premix  1 g Intravenous Once [name], MD      . ceFAZolin (ANCEF) IVPB 1 g/50 mL premix  1 g Intravenous Once [name], MD      . ceFAZolin (ANCEF) IVPB 1 g/50 mL premix  1 g Intravenous Once [name], MD      . fentaNYL (SUBLIMAZE) injection 100 mcg   100 mcg Intravenous Once [name], MD      . fentaNYL (SUBLIMAZE) injection 100 mcg  100 mcg Intravenous Once [name], MD      . lactated ringers infusion 1,000 mL  1,000 mL Intravenous Continuous [name], MD      . lactated ringers infusion 1,000 mL  1,000 mL Intravenous Continuous [name], MD      . lactated ringers infusion 1,000 mL  1,000 mL Intravenous Continuous [name], MD      . lactated ringers infusion 1,000 mL  1,000 mL Intravenous Continuous  [name], MD 125 mL/hr at 12/11/15 1208 1,000 mL at 12/11/15 1208  . lactated ringers infusion 1,000 mL  1,000 mL Intravenous Continuous [name], MD      . lactated ringers infusion 1,000 mL  1,000 mL Intravenous Continuous [name], MD      . lidocaine (PF) (XYLOCAINE) 1 % injection 10 mL  10 mL Subcutaneous Once [name], MD      . lidocaine (PF) (XYLOCAINE) 1 % injection 10 mL  10 mL Subcutaneous Once [name], MD      . midazolam (VERSED) 5 MG/5ML injection 5 mg  5 mg Intravenous Once [name], MD      . midazolam (VERSED) 5 MG/5ML injection 5 mg  5 mg Intravenous Once [name], MD      . orphenadrine (NORFLEX) injection 60 mg  60 mg Intramuscular Once [name], MD      . orphenadrine (NORFLEX) injection 60 mg  60 mg Intramuscular Once [name], MD      . triamcinolone acetonide (KENALOG-40) injection 40 mg  40 mg Other Once [name], MD      . triamcinolone acetonide [facility]) injection 40 mg  40 mg Other Once [name], MD       Facility-Administered Medications Ordered in Other Visits  Medication Dose Route Frequency Provider Last Rate Last Dose  . heparin lock flush 100 unit/mL  500 Units Intravenous Once [name], MD      . sodium chloride 0.9 % injection 10 mL  10 mL Intravenous Once [name], MD      . sodium chloride flush (NS) 0.9 % injection 10 mL  10 mL Intravenous PRN [name], MD   10 mL at 12/24/15 1444     OBJECTIVE: Vitals:   03/24/16 1511  BP: 118/84  Pulse: 93  Resp: 18  Temp: (!) 96.8 F (36 C)     Body mass index is 33.82 kg/m.    ECOG FS:1 - Symptomatic but completely ambulatory  General: Well-developed, well-nourished, no acute distress. Eyes: anicteric sclera. Breasts: Patient refused exam today. Lungs: Clear to auscultation bilaterally. Heart: Regular rate and rhythm. No rubs, murmurs, or gallops. Abdomen: Soft, nontender, nondistended.  Musculoskeletal: No edema, cyanosis, or clubbing. Neuro: Alert, answering all questions appropriately. Cranial nerves grossly intact. Skin: No rashes or petechiae noted. Psych: Normal affect.    LAB RESULTS:  Lab Results  Component Value Date   NA 143 09/27/2015   K 3.8 09/27/2015   CL 106 09/27/2015   CO2 20 09/27/2015   GLUCOSE 85 09/27/2015   BUN 19 09/27/2015   CREATININE 0.98 09/27/2015   CALCIUM 9.7 09/27/2015   PROT 6.5 09/27/2015   ALBUMIN 4.0 09/27/2015   AST 16 09/27/2015   ALT 12 09/27/2015   ALKPHOS 94 09/27/2015   BILITOT 0.2 09/27/2015   GFRNONAA 61 09/27/2015   GFRAA 70 09/27/2015    Lab Results  Component Value Date   WBC 8.7 09/27/2015   NEUTROABS 6.8 09/27/2015   HGB 11.4 (L) 09/03/2015   HCT 34.7 09/27/2015   MCV 81 09/27/2015   PLT 259 09/27/2015     STUDIES: No results found.  ASSESSMENT: Stage IIa ER+ left breast cancer with no breast lesion and axillary lymph node metastasis. (TxN1M0)  HER-2 negative.   PLAN:   1. Stage IIa ER+ left breast cancer with no breast lesion and axillary lymph node metastasis: Despite no obvious breast lesion on mammogram or breast MRI,  pathology and pattern of spread was consistent with primary breast cancer. CT and bone scan with no other evidence of malignancy. Previously because of difficulty with treatment, patient only received 3 cycles of Adriamycin and Cytoxan. She did not complete 12 cycles of weekly Taxol. It was elected not to pursue axillary node  dissection given the potential morbidity of this procedure. It was also elected not to pursue adjuvant XRT given there was no primary breast lesion. Continue with letrozole which patient will require to take for 5 years completing in July 2021. Baseline bone mineral density on January 24, 2015 with a T score of -0.7, which is considered normal. Repeat in July 2018. Her most recent mammogram on June 20, 2015 was reported as BI-RADS 1, repeat in one year. Return to clinic in 3 months for routine evaluation at which time she can likely be switched to visits every 6 months. 2. Family history: Patient's sister reports she has positive for Lynch syndrome and we are attempting to confirm these results. Patient may also benefit from genetic testing herself in the near future.  Patient had a colonoscopy in October 2012 that was reported as normal. 3. Hypertension: Treatment as per primary care.  4. Pain/Peripheral neuropathy: Continue evaluation and treatment per Pain Clinic   Patient expressed understanding and was in agreement with this plan. She also understands that She can call clinic at any time with any questions, concerns, or complaints.   Breast cancer metastasized to axillary lymph node   Staging form: Breast, AJCC 7th Edition     Clinical stage from 10/22/2014: Stage Unknown (TX, N1, M0) - Signed by [name], MD on 10/22/2014   [name], MD   03/24/2016 9:34 PM

## 2016-03-24 ENCOUNTER — Ambulatory Visit: Payer: Medicare Other | Admitting: Pain Medicine

## 2016-03-24 ENCOUNTER — Inpatient Hospital Stay: Payer: Medicare Other | Attending: Oncology | Admitting: Oncology

## 2016-03-24 VITALS — BP 118/84 | HR 93 | Temp 96.8°F | Resp 18 | Wt 190.9 lb

## 2016-03-24 DIAGNOSIS — Z170 Estrogen receptor positive status [ER+]: Secondary | ICD-10-CM | POA: Insufficient documentation

## 2016-03-24 DIAGNOSIS — C773 Secondary and unspecified malignant neoplasm of axilla and upper limb lymph nodes: Secondary | ICD-10-CM | POA: Insufficient documentation

## 2016-03-24 DIAGNOSIS — C50912 Malignant neoplasm of unspecified site of left female breast: Secondary | ICD-10-CM | POA: Insufficient documentation

## 2016-03-24 DIAGNOSIS — M533 Sacrococcygeal disorders, not elsewhere classified: Secondary | ICD-10-CM | POA: Diagnosis not present

## 2016-03-24 DIAGNOSIS — M5416 Radiculopathy, lumbar region: Secondary | ICD-10-CM | POA: Diagnosis not present

## 2016-03-24 DIAGNOSIS — K219 Gastro-esophageal reflux disease without esophagitis: Secondary | ICD-10-CM | POA: Insufficient documentation

## 2016-03-24 DIAGNOSIS — I10 Essential (primary) hypertension: Secondary | ICD-10-CM | POA: Diagnosis not present

## 2016-03-24 DIAGNOSIS — Z8542 Personal history of malignant neoplasm of other parts of uterus: Secondary | ICD-10-CM | POA: Insufficient documentation

## 2016-03-24 DIAGNOSIS — Z9221 Personal history of antineoplastic chemotherapy: Secondary | ICD-10-CM | POA: Diagnosis not present

## 2016-03-24 DIAGNOSIS — Z803 Family history of malignant neoplasm of breast: Secondary | ICD-10-CM | POA: Diagnosis not present

## 2016-03-24 DIAGNOSIS — Z87891 Personal history of nicotine dependence: Secondary | ICD-10-CM | POA: Insufficient documentation

## 2016-03-24 DIAGNOSIS — Z90710 Acquired absence of both cervix and uterus: Secondary | ICD-10-CM

## 2016-03-24 DIAGNOSIS — G8929 Other chronic pain: Secondary | ICD-10-CM | POA: Diagnosis not present

## 2016-03-24 DIAGNOSIS — F329 Major depressive disorder, single episode, unspecified: Secondary | ICD-10-CM | POA: Diagnosis not present

## 2016-03-24 DIAGNOSIS — M47817 Spondylosis without myelopathy or radiculopathy, lumbosacral region: Secondary | ICD-10-CM | POA: Diagnosis not present

## 2016-03-24 DIAGNOSIS — Z79811 Long term (current) use of aromatase inhibitors: Secondary | ICD-10-CM

## 2016-03-24 DIAGNOSIS — Z79899 Other long term (current) drug therapy: Secondary | ICD-10-CM | POA: Diagnosis not present

## 2016-03-24 DIAGNOSIS — G629 Polyneuropathy, unspecified: Secondary | ICD-10-CM | POA: Insufficient documentation

## 2016-03-24 DIAGNOSIS — M791 Myalgia: Secondary | ICD-10-CM | POA: Diagnosis not present

## 2016-03-25 ENCOUNTER — Other Ambulatory Visit: Payer: Self-pay | Admitting: Pain Medicine

## 2016-03-27 ENCOUNTER — Other Ambulatory Visit: Payer: Self-pay

## 2016-03-27 ENCOUNTER — Other Ambulatory Visit: Payer: Self-pay | Admitting: *Deleted

## 2016-03-27 DIAGNOSIS — Z853 Personal history of malignant neoplasm of breast: Secondary | ICD-10-CM

## 2016-04-11 ENCOUNTER — Other Ambulatory Visit: Payer: Self-pay | Admitting: Family Medicine

## 2016-04-11 DIAGNOSIS — I10 Essential (primary) hypertension: Secondary | ICD-10-CM

## 2016-04-16 NOTE — Progress Notes (Signed)
Order(s) created erroneously. Erroneous order ID: [id number]  Order moved by: Kelly Splinter  Order move date/time: 04/16/2016 11:57 AM  Source Patient: [id number]  Source Contact: 11/12/2014  Destination Patient: [id number]  Destination Contact: 11/12/2014

## 2016-04-16 NOTE — Progress Notes (Signed)
Order(s) created erroneously. Erroneous order ID: [id number]  Order moved by: Kelly Splinter  Order move date/time: 04/16/2016 11:54 AM  Source Patient: [id number]  Source Contact: 11/12/2014  Destination Patient: [id number]  Destination Contact: 11/12/2014

## 2016-04-18 ENCOUNTER — Emergency Department: Payer: Commercial Managed Care - HMO

## 2016-04-18 ENCOUNTER — Inpatient Hospital Stay
Admission: EM | Admit: 2016-04-18 | Discharge: 2016-04-21 | DRG: 419 | Disposition: A | Discharge status: Home / Self Care | Payer: Commercial Managed Care - HMO | Attending: Surgery | Admitting: Surgery

## 2016-04-18 ENCOUNTER — Encounter: Payer: Self-pay | Admitting: Emergency Medicine

## 2016-04-18 DIAGNOSIS — Z6832 Body mass index (BMI) 32.0-32.9, adult: Secondary | ICD-10-CM | POA: Diagnosis not present

## 2016-04-18 DIAGNOSIS — Z79899 Other long term (current) drug therapy: Secondary | ICD-10-CM

## 2016-04-18 DIAGNOSIS — G629 Polyneuropathy, unspecified: Secondary | ICD-10-CM | POA: Diagnosis not present

## 2016-04-18 DIAGNOSIS — K8063 Calculus of gallbladder and bile duct with acute cholecystitis with obstruction: Principal | ICD-10-CM | POA: Diagnosis present

## 2016-04-18 DIAGNOSIS — Z23 Encounter for immunization: Secondary | ICD-10-CM

## 2016-04-18 DIAGNOSIS — R7401 Elevation of levels of liver transaminase levels: Secondary | ICD-10-CM

## 2016-04-18 DIAGNOSIS — K219 Gastro-esophageal reflux disease without esophagitis: Secondary | ICD-10-CM | POA: Diagnosis present

## 2016-04-18 DIAGNOSIS — Z853 Personal history of malignant neoplasm of breast: Secondary | ICD-10-CM | POA: Diagnosis not present

## 2016-04-18 DIAGNOSIS — Z800 Family history of malignant neoplasm of digestive organs: Secondary | ICD-10-CM

## 2016-04-18 DIAGNOSIS — Z825 Family history of asthma and other chronic lower respiratory diseases: Secondary | ICD-10-CM | POA: Diagnosis not present

## 2016-04-18 DIAGNOSIS — G8929 Other chronic pain: Secondary | ICD-10-CM | POA: Diagnosis not present

## 2016-04-18 DIAGNOSIS — Z9221 Personal history of antineoplastic chemotherapy: Secondary | ICD-10-CM | POA: Diagnosis not present

## 2016-04-18 DIAGNOSIS — R740 Nonspecific elevation of levels of transaminase and lactic acid dehydrogenase [LDH]: Secondary | ICD-10-CM | POA: Diagnosis not present

## 2016-04-18 DIAGNOSIS — E669 Obesity, unspecified: Secondary | ICD-10-CM | POA: Diagnosis present

## 2016-04-18 DIAGNOSIS — I10 Essential (primary) hypertension: Secondary | ICD-10-CM | POA: Diagnosis not present

## 2016-04-18 DIAGNOSIS — Z8249 Family history of ischemic heart disease and other diseases of the circulatory system: Secondary | ICD-10-CM | POA: Diagnosis not present

## 2016-04-18 DIAGNOSIS — Z803 Family history of malignant neoplasm of breast: Secondary | ICD-10-CM | POA: Diagnosis not present

## 2016-04-18 DIAGNOSIS — Z87891 Personal history of nicotine dependence: Secondary | ICD-10-CM | POA: Diagnosis not present

## 2016-04-18 DIAGNOSIS — Z791 Long term (current) use of non-steroidal anti-inflammatories (NSAID): Secondary | ICD-10-CM

## 2016-04-18 DIAGNOSIS — Z8542 Personal history of malignant neoplasm of other parts of uterus: Secondary | ICD-10-CM | POA: Diagnosis not present

## 2016-04-18 DIAGNOSIS — K830 Cholangitis: Secondary | ICD-10-CM | POA: Diagnosis present

## 2016-04-18 DIAGNOSIS — F329 Major depressive disorder, single episode, unspecified: Secondary | ICD-10-CM | POA: Diagnosis present

## 2016-04-18 DIAGNOSIS — Z79811 Long term (current) use of aromatase inhibitors: Secondary | ICD-10-CM

## 2016-04-18 DIAGNOSIS — R1011 Right upper quadrant pain: Secondary | ICD-10-CM

## 2016-04-18 DIAGNOSIS — K810 Acute cholecystitis: Secondary | ICD-10-CM

## 2016-04-18 DIAGNOSIS — K8309 Other cholangitis: Secondary | ICD-10-CM

## 2016-04-18 LAB — APTT: aPTT: 30 seconds (ref 24–36)

## 2016-04-18 LAB — CBC WITH DIFFERENTIAL/PLATELET
Basophils Absolute: 0.1 10*3/uL (ref 0–0.1)
Basophils Relative: 1 %
Eosinophils Absolute: 0 10*3/uL (ref 0–0.7)
Eosinophils Relative: 0 %
HCT: 39.5 % (ref 35.0–47.0)
Hemoglobin: 13.9 g/dL (ref 12.0–16.0)
Lymphocytes Relative: 10 %
Lymphs Abs: 1.1 10*3/uL (ref 1.0–3.6)
MCH: 28.6 pg (ref 26.0–34.0)
MCHC: 35.1 g/dL (ref 32.0–36.0)
MCV: 81.5 fL (ref 80.0–100.0)
Monocytes Absolute: 0.7 10*3/uL (ref 0.2–0.9)
Monocytes Relative: 6 %
Neutro Abs: 8.7 10*3/uL — ABNORMAL HIGH (ref 1.4–6.5)
Neutrophils Relative %: 83 %
Platelets: 185 10*3/uL (ref 150–440)
RBC: 4.85 MIL/uL (ref 3.80–5.20)
RDW: 14 % (ref 11.5–14.5)
WBC: 10.6 10*3/uL (ref 3.6–11.0)

## 2016-04-18 LAB — COMPREHENSIVE METABOLIC PANEL
ALT: 272 U/L — ABNORMAL HIGH (ref 14–54)
AST: 281 U/L — ABNORMAL HIGH (ref 15–41)
Albumin: 4 g/dL (ref 3.5–5.0)
Alkaline Phosphatase: 249 U/L — ABNORMAL HIGH (ref 38–126)
Anion gap: 10 (ref 5–15)
BUN: 14 mg/dL (ref 6–20)
CO2: 22 mmol/L (ref 22–32)
Calcium: 9.6 mg/dL (ref 8.9–10.3)
Chloride: 106 mmol/L (ref 101–111)
Creatinine, Ser: 0.94 mg/dL (ref 0.44–1.00)
GFR calc Af Amer: >60 mL/min (ref >60)
GFR calc non Af Amer: >60 mL/min (ref >60)
Glucose, Bld: 90 mg/dL (ref 65–99)
Potassium: 3.6 mmol/L (ref 3.5–5.1)
Sodium: 138 mmol/L (ref 135–145)
Total Bilirubin: 3.7 mg/dL — ABNORMAL HIGH (ref 0.3–1.2)
Total Protein: 7.6 g/dL (ref 6.5–8.1)

## 2016-04-18 LAB — URINALYSIS COMPLETE WITH MICROSCOPIC (ARMC ONLY)
Bacteria, UA: NONE SEEN
Bilirubin Urine: NEGATIVE
Glucose, UA: NEGATIVE mg/dL
Hgb urine dipstick: NEGATIVE
Ketones, ur: NEGATIVE mg/dL
Leukocytes, UA: NEGATIVE
Nitrite: NEGATIVE
Protein, ur: NEGATIVE mg/dL
Specific Gravity, Urine: 1.027 (ref 1.005–1.030)
pH: 7 (ref 5.0–8.0)

## 2016-04-18 LAB — PHOSPHORUS: Phosphorus: 1.1 mg/dL — ABNORMAL LOW (ref 2.5–4.6)

## 2016-04-18 LAB — LIPASE, BLOOD: Lipase: 20 U/L (ref 11–51)

## 2016-04-18 LAB — PROTIME-INR
INR: 0.98
Prothrombin Time: 13 seconds (ref 11.4–15.2)

## 2016-04-18 LAB — LACTIC ACID, PLASMA: Lactic Acid, Venous: 1.9 mmol/L (ref 0.5–1.9)

## 2016-04-18 LAB — MAGNESIUM: Magnesium: 1.8 mg/dL (ref 1.7–2.4)

## 2016-04-18 LAB — TROPONIN I: Troponin I: <0.03 ng/mL (ref <0.03)

## 2016-04-18 MED ORDER — PROMETHAZINE HCL 25 MG/ML IJ SOLN
12.5000 mg | Freq: Once | INTRAMUSCULAR | Status: AC
  Administered 2016-04-18: 12.5 mg via INTRAVENOUS
  Filled 2016-04-18: qty 1

## 2016-04-18 MED ORDER — IOPAMIDOL (ISOVUE-300) INJECTION 61%
100.0000 mL | Freq: Once | INTRAVENOUS | Status: AC | PRN
  Administered 2016-04-18: 100 mL via INTRAVENOUS

## 2016-04-18 MED ORDER — VITAMIN D 1000 UNITS PO TABS
1000.0000 [IU] | ORAL_TABLET | Freq: Every day | ORAL | Status: DC
  Administered 2016-04-18 – 2016-04-21 (×3): 1000 [IU] via ORAL
  Filled 2016-04-18 (×4): qty 1

## 2016-04-18 MED ORDER — LORATADINE 10 MG PO TABS
10.0000 mg | ORAL_TABLET | Freq: Every day | ORAL | Status: DC
  Administered 2016-04-20 – 2016-04-21 (×2): 10 mg via ORAL
  Filled 2016-04-18 (×2): qty 1

## 2016-04-18 MED ORDER — AMLODIPINE BESYLATE 5 MG PO TABS
2.5000 mg | ORAL_TABLET | Freq: Every day | ORAL | Status: DC
  Administered 2016-04-18 – 2016-04-21 (×4): 2.5 mg via ORAL
  Filled 2016-04-18 (×4): qty 1

## 2016-04-18 MED ORDER — SENNOSIDES-DOCUSATE SODIUM 8.6-50 MG PO TABS: 1.0000 | ORAL_TABLET | Freq: Every evening | ORAL | Status: DC | PRN

## 2016-04-18 MED ORDER — FLUTICASONE PROPIONATE 50 MCG/ACT NA SUSP
1.0000 | NASAL | Status: DC | PRN
  Filled 2016-04-18: qty 16

## 2016-04-18 MED ORDER — ZOLPIDEM TARTRATE 5 MG PO TABS: 5.0000 mg | ORAL_TABLET | Freq: Every evening | ORAL | Status: DC | PRN

## 2016-04-18 MED ORDER — HYDROCODONE-ACETAMINOPHEN 5-325 MG PO TABS
1.0000 | ORAL_TABLET | ORAL | Status: DC | PRN
  Administered 2016-04-18 – 2016-04-19 (×2): 2 via ORAL
  Filled 2016-04-18 (×2): qty 2

## 2016-04-18 MED ORDER — BISACODYL 5 MG PO TBEC: 5.0000 mg | DELAYED_RELEASE_TABLET | Freq: Every day | ORAL | Status: DC | PRN

## 2016-04-18 MED ORDER — CYCLOBENZAPRINE HCL 10 MG PO TABS: 5.0000 mg | ORAL_TABLET | Freq: Three times a day (TID) | ORAL | Status: DC | PRN

## 2016-04-18 MED ORDER — LETROZOLE 2.5 MG PO TABS
2.5000 mg | ORAL_TABLET | Freq: Every day | ORAL | Status: DC
  Administered 2016-04-20 – 2016-04-21 (×2): 2.5 mg via ORAL
  Filled 2016-04-18 (×4): qty 1

## 2016-04-18 MED ORDER — ONDANSETRON HCL 4 MG PO TABS: 4.0000 mg | ORAL_TABLET | Freq: Four times a day (QID) | ORAL | Status: DC | PRN

## 2016-04-18 MED ORDER — PIPERACILLIN-TAZOBACTAM 3.375 G IVPB
3.3750 g | Freq: Three times a day (TID) | INTRAVENOUS | Status: DC
  Administered 2016-04-18 – 2016-04-21 (×8): 3.375 g via INTRAVENOUS
  Filled 2016-04-18 (×8): qty 50

## 2016-04-18 MED ORDER — VITAMIN B-6 50 MG PO TABS
100.0000 mg | ORAL_TABLET | Freq: Every day | ORAL | Status: DC
  Administered 2016-04-20: 100 mg via ORAL
  Filled 2016-04-18: qty 1
  Filled 2016-04-18: qty 2
  Filled 2016-04-18: qty 1

## 2016-04-18 MED ORDER — SODIUM CHLORIDE 0.9 % IV SOLN
INTRAVENOUS | Status: DC
  Administered 2016-04-18 – 2016-04-19 (×3): via INTRAVENOUS

## 2016-04-18 MED ORDER — GABAPENTIN 300 MG PO CAPS: 300.0000 mg | ORAL_CAPSULE | Freq: Three times a day (TID) | ORAL | Status: DC | PRN

## 2016-04-18 MED ORDER — PROMETHAZINE HCL 25 MG/ML IJ SOLN: 12.5000 mg | Freq: Three times a day (TID) | INTRAMUSCULAR | Status: DC | PRN

## 2016-04-18 MED ORDER — GADOBENATE DIMEGLUMINE 529 MG/ML IV SOLN
20.0000 mL | Freq: Once | INTRAVENOUS | Status: AC | PRN
  Administered 2016-04-18: 17 mL via INTRAVENOUS

## 2016-04-18 MED ORDER — ENOXAPARIN SODIUM 40 MG/0.4ML ~~LOC~~ SOLN
40.0000 mg | SUBCUTANEOUS | Status: DC
  Administered 2016-04-18 – 2016-04-20 (×3): 40 mg via SUBCUTANEOUS
  Filled 2016-04-18 (×3): qty 0.4

## 2016-04-18 MED ORDER — MAGNESIUM CITRATE PO SOLN
1.0000 | Freq: Once | ORAL | Status: DC | PRN
  Filled 2016-04-18: qty 296

## 2016-04-18 MED ORDER — PANTOPRAZOLE SODIUM 40 MG PO TBEC
40.0000 mg | DELAYED_RELEASE_TABLET | Freq: Every day | ORAL | Status: DC
  Administered 2016-04-19 – 2016-04-21 (×3): 40 mg via ORAL
  Filled 2016-04-18 (×3): qty 1

## 2016-04-18 MED ORDER — ACETAMINOPHEN 650 MG RE SUPP: 650.0000 mg | Freq: Four times a day (QID) | RECTAL | Status: DC | PRN

## 2016-04-18 MED ORDER — GI COCKTAIL ~~LOC~~
30.0000 mL | Freq: Two times a day (BID) | ORAL | Status: DC | PRN
  Filled 2016-04-18: qty 30

## 2016-04-18 MED ORDER — SERTRALINE HCL 100 MG PO TABS
200.0000 mg | ORAL_TABLET | Freq: Every day | ORAL | Status: DC
  Administered 2016-04-18 – 2016-04-20 (×3): 200 mg via ORAL
  Filled 2016-04-18 (×3): qty 2

## 2016-04-18 MED ORDER — ONDANSETRON HCL 4 MG/2ML IJ SOLN
4.0000 mg | Freq: Four times a day (QID) | INTRAMUSCULAR | Status: DC | PRN
  Administered 2016-04-19 – 2016-04-21 (×3): 4 mg via INTRAVENOUS
  Filled 2016-04-18 (×2): qty 2

## 2016-04-18 MED ORDER — ACETAMINOPHEN 325 MG PO TABS: 650.0000 mg | ORAL_TABLET | Freq: Four times a day (QID) | ORAL | Status: DC | PRN

## 2016-04-18 MED ORDER — MORPHINE SULFATE (PF) 2 MG/ML IV SOLN: 1.0000 mg | INTRAVENOUS | Status: DC | PRN

## 2016-04-18 MED ORDER — FENTANYL CITRATE (PF) 100 MCG/2ML IJ SOLN
50.0000 ug | INTRAMUSCULAR | Status: DC | PRN
  Administered 2016-04-18: 50 ug via INTRAVENOUS
  Filled 2016-04-18: qty 2

## 2016-04-18 MED ORDER — VITAMIN B-12 1000 MCG PO TABS
500.0000 ug | ORAL_TABLET | Freq: Every day | ORAL | Status: DC
  Administered 2016-04-18 – 2016-04-21 (×3): 500 ug via ORAL
  Filled 2016-04-18 (×3): qty 1

## 2016-04-18 MED ORDER — GI COCKTAIL ~~LOC~~
30.0000 mL | Freq: Once | ORAL | Status: AC
  Administered 2016-04-18: 30 mL via ORAL

## 2016-04-18 MED ORDER — IOPAMIDOL (ISOVUE-300) INJECTION 61%
30.0000 mL | Freq: Once | INTRAVENOUS | Status: AC | PRN
  Administered 2016-04-18: 30 mL via ORAL

## 2016-04-18 MED ORDER — PIPERACILLIN-TAZOBACTAM 3.375 G IVPB 30 MIN
3.3750 g | Freq: Once | INTRAVENOUS | Status: AC
  Administered 2016-04-18: 3.375 g via INTRAVENOUS
  Filled 2016-04-18: qty 50

## 2016-04-18 NOTE — ED Triage Notes (Signed)
C/O abdominal pain, vomiting and diarrhea.  Onset of symptoms last night.  C/O generalized abdominal pain.

## 2016-04-18 NOTE — ED Provider Notes (Signed)
 -----------------------------------------   4:41 PM on 04/18/2016 -----------------------------------------  MRCP consistent with cholelithiasis and cholecystitis without evidence of choledocholithiasis. Appears to require surgical management at this time, and not ERCP by gastroenterology. Currently surgery is unreachable by their direct line after 3 attempts to call. I will page them and await a response for further recommendations.    ----------------------------------------- 5:06 PM on 04/18/2016 -----------------------------------------  Discussed with Dr. Azalee Course in ED. On reviewing imaging, presentation appears to be consistent with cholangitis and cholecystitis, requiring IV antibiotics before it is safe to operate. Case discussed with hospitalist for admission. Zosyn given by Dr. Quentin Cornwall.   Carrie Mew, MD 04/18/16 [phone]

## 2016-04-18 NOTE — ED Provider Notes (Signed)
[facility] Emergency Department Provider Note    First MD Initiated Contact with Patient 04/18/16 [phone]     (approximate)  I have reviewed the triage vital signs and the nursing notes.   HISTORY  Chief Complaint Abdominal Pain; Emesis; and Diarrhea    HPI Carrie Alvarez is a 65 y.o. female with a history of GERD presents with 1 day of epigastric abdominal pain, nonbilious nonbloody vomiting and loose stools without any melena or hematochezia. She currently rates the pain as 10 out of 10 in severity. There is no radiation of the pain. Denies any fevers. States that she was not able to eat last night due to the pain. States that for lunch she had a pain (jelly sandwich. No undercooked foods. She's never had this kind of discomfort before.  She denies any chest pain or pressure. Denies any shortness of breath. No flank pain.   Past Medical History:  Diagnosis Date  . Allergy   . Anemia   . Back pain   . Breast cancer ([place]) 2015   LT LUMPECTOMY 12-2014 FOLLOWING CHEMO  . Carpal tunnel syndrome   . Depression   . GERD (gastroesophageal reflux disease)   . Hypertension   . Obesity   . Uterine cancer [facility]) 1994    Patient Active Problem List   Diagnosis Date Noted  . Allergic rhinitis 09/04/2015  . AA (alopecia areata) 09/04/2015  . Arthritis, degenerative 09/04/2015  . Cancer of uterus ([place]) 09/04/2015  . Avitaminosis D 09/04/2015  . Benign positional vertigo 09/04/2015  . BP (high blood pressure) 03/15/2015  . DDD (degenerative disc disease), lumbar 03/07/2015  . Dizziness 12/25/2014  . Lumbosacral facet joint syndrome 12/10/2014  . Sacroiliac joint dysfunction 12/10/2014  . DDD (degenerative disc disease), lumbosacral 11/13/2014  . Clinical depression 10/13/2014  . Breast cancer metastasized to axillary lymph node ([place]) 06/21/2014  . Hypercholesteremia 08/30/2009  . Compulsive tobacco user syndrome 04/21/2008  . Adaptation reaction  07/18/2007    Past Surgical History:  Procedure Laterality Date  . ABDOMINAL HYSTERECTOMY  1994   UTERINE CA  . AXILLARY LYMPH NODE BIOPSY Left 12/18/2014   Procedure: AXILLARY LYMPH NODE BIOPSY/;  Surgeon: Robert Bellow, MD;  Location: [facility] ORS;  Service: General;  Laterality: Left;  . AXILLARY LYMPH NODE DISSECTION Left 12/18/2014   Procedure: AXILLARY LYMPH NODE DISSECTION;  Surgeon: Robert Bellow, MD;  Location: [facility] ORS;  Service: General;  Laterality: Left;  . BREAST BIOPSY Left 05/2014   CORE - POS  . BREAST SURGERY    . medial branch block  11/06/2015   lumbar facet Dr. Primus Bravo  . SENTINEL NODE BIOPSY Left 12/18/2014   Procedure: SENTINEL NODE BIOPSY;  Surgeon: Robert Bellow, MD;  Location: [facility] ORS;  Service: General;  Laterality: Left;    Prior to Admission medications   Medication Sig Start Date End Date Taking? Authorizing Provider  amLODipine (NORVASC) 2.5 MG tablet TAKE ONE TABLET BY MOUTH ONCE DAILY 04/13/16  Yes Clearnce Sorrel Burnette, PA-C  cholecalciferol (VITAMIN D) 1000 units tablet Take 1,000 Units by mouth daily.   Yes Historical Provider, MD  cyclobenzaprine (FLEXERIL) 10 MG tablet Limit one half to one tablet by mouth per day or twice per day if tolerated     NO NORFLEX Patient taking differently: 10 mg. Limit one half to one tablet by mouth per day or twice per day if tolerated     NO NORFLEX 02/24/16  Yes [name], MD  fluticasone (FLONASE) 50 MCG/ACT nasal spray Place 1 spray into both nostrils as needed for allergies or rhinitis.   Yes Historical Provider, MD  gabapentin (NEURONTIN) 300 MG capsule Limit 1 tablet by mouth 2-4 times per day if tolerated 02/24/16  Yes [name], MD  letrozole [facility]) 2.5 MG tablet Take 1 tablet (2.5 mg total) by mouth daily. 04/22/15  Yes [name], MD  loratadine (CLARITIN) 10 MG tablet Take 10 mg by mouth daily.   Yes Historical Provider, MD  meloxicam (MOBIC) 15 MG tablet TAKE ONE TABLET BY MOUTH ONCE DAILY  03/14/16  Yes Clearnce Sorrel Burnette, PA-C  omeprazole (PRILOSEC) 20 MG capsule Take 40 mg by mouth daily.   Yes Historical Provider, MD  pyridOXINE (VITAMIN B-6) 100 MG tablet Take 100 mg by mouth daily.   Yes Historical Provider, MD  sertraline (ZOLOFT) 50 MG tablet TAKE FOUR TABLETS BY MOUTH AT BEDTIME 03/12/16  Yes Richard Maceo Pro., MD  traMADol [facility]at[facility]) 50 MG tablet Limit 1 tab by mouth 2-4 times per day if tolerated 02/24/16  Yes [name], MD  vitamin B-12 (CYANOCOBALAMIN) 500 MCG tablet Take 500 mcg by mouth daily.   Yes Historical Provider, MD  cefUROXime (CEFTIN) 250 MG tablet Take 1 tablet (250 mg total) by mouth 2 (two) times daily with a meal. 01/20/16   [name], MD    Allergies Review of patient's allergies indicates no known allergies.  Family History  Problem Relation Age of Onset  . Breast cancer Sister   . Colon cancer Mother   . Hypertension Mother   . Asthma Mother   . Cancer Father     Social History Social History  Substance Use Topics  . Smoking status: Former Smoker    Packs/day: 0.50    Types: Cigarettes    Quit date: 07/18/2014  . Smokeless tobacco: Never Used     Comment: given smoking and pain information   . Alcohol use No    Review of Systems Patient denies headaches, rhinorrhea, blurry vision, numbness, shortness of breath, chest pain, edema, cough, abdominal pain, nausea, vomiting, diarrhea, dysuria, fevers, rashes or hallucinations unless otherwise stated above in HPI. ____________________________________________   PHYSICAL EXAM:  VITAL SIGNS: Vitals:   04/18/16 0834  BP: (!) 154/106  Pulse: 85  Resp: 20  Temp: 98.4 F (36.9 C)    Constitutional: Alert and oriented. Uncomfortable appearing but in NAD Eyes: Conjunctivae are normal. PERRL. EOMI. Head: Atraumatic. Nose: No congestion/rhinnorhea. Mouth/Throat: Mucous membranes are moist.  Oropharynx non-erythematous. Neck: No stridor. Painless ROM. No cervical spine tenderness  to palpation Hematological/Lymphatic/Immunilogical: No cervical lymphadenopathy. Cardiovascular: Normal rate, regular rhythm. Grossly normal heart sounds.  Good peripheral circulation. Respiratory: Normal respiratory effort.  No retractions. Lungs CTAB. Gastrointestinal: Soft with TTP in epigastric region. No distention. No abdominal bruits. No CVA tenderness. Genitourinary:  Musculoskeletal: No lower extremity tenderness nor edema.  No joint effusions. Neurologic:  Normal speech and language. No gross focal neurologic deficits are appreciated. No gait instability. Skin:  Skin is warm, dry and intact. No rash noted. Psychiatric: Mood and affect are normal. Speech and behavior are normal.  ____________________________________________   LABS (all labs ordered are listed, but only abnormal results are displayed)  Results for orders placed or performed during the hospital encounter of 04/18/16 (from the past 24 hour(s))  Troponin I     Status: None   Collection Time: 04/18/16  8:38 AM  Result Value Ref Range   Troponin I <0.03 <0.03  ng/mL  CBC with Differential/Platelet     Status: Abnormal   Collection Time: 04/18/16  8:38 AM  Result Value Ref Range   WBC 10.6 3.6 - 11.0 K/uL   RBC 4.85 3.80 - 5.20 MIL/uL   Hemoglobin 13.9 12.0 - 16.0 g/dL   HCT 39.5 35.0 - 47.0 %   MCV 81.5 80.0 - 100.0 fL   MCH 28.6 26.0 - 34.0 pg   MCHC 35.1 32.0 - 36.0 g/dL   RDW 14.0 11.5 - 14.5 %   Platelets 185 150 - 440 K/uL   Neutrophils Relative % 83 %   Neutro Abs 8.7 (H) 1.4 - 6.5 K/uL   Lymphocytes Relative 10 %   Lymphs Abs 1.1 1.0 - 3.6 K/uL   Monocytes Relative 6 %   Monocytes Absolute 0.7 0.2 - 0.9 K/uL   Eosinophils Relative 0 %   Eosinophils Absolute 0.0 0 - 0.7 K/uL   Basophils Relative 1 %   Basophils Absolute 0.1 0 - 0.1 K/uL  Comprehensive metabolic panel     Status: Abnormal   Collection Time: 04/18/16  8:38 AM  Result Value Ref Range   Sodium 138 135 - 145 mmol/L   Potassium 3.6  3.5 - 5.1 mmol/L   Chloride 106 101 - 111 mmol/L   CO2 22 22 - 32 mmol/L   Glucose, Bld 90 65 - 99 mg/dL   BUN 14 6 - 20 mg/dL   Creatinine, Ser 0.94 0.44 - 1.00 mg/dL   Calcium 9.6 8.9 - 10.3 mg/dL   Total Protein 7.6 6.5 - 8.1 g/dL   Albumin 4.0 3.5 - 5.0 g/dL   AST 281 (H) 15 - 41 U/L   ALT 272 (H) 14 - 54 U/L   Alkaline Phosphatase 249 (H) 38 - 126 U/L   Total Bilirubin 3.7 (H) 0.3 - 1.2 mg/dL   GFR calc non Af Amer >60 >60 mL/min   GFR calc Af Amer >60 >60 mL/min   Anion gap 10 5 - 15  Lipase, blood     Status: None   Collection Time: 04/18/16  8:38 AM  Result Value Ref Range   Lipase 20 11 - 51 U/L  Lactic acid, plasma     Status: None   Collection Time: 04/18/16  8:39 AM  Result Value Ref Range   Lactic Acid, Venous 1.9 0.5 - 1.9 mmol/L  Protime-INR     Status: None   Collection Time: 04/18/16 11:30 AM  Result Value Ref Range   Prothrombin Time 13.0 11.4 - 15.2 seconds   INR 0.98   APTT     Status: None   Collection Time: 04/18/16 11:30 AM  Result Value Ref Range   aPTT 30 24 - 36 seconds   ____________________________________________  EKG My review and personal interpretation at Time: 8:58   Indication: abd pain  Rate: 80  Rhythm: nsr Axis: normal Other: no acute ischemic changes, normal intervals ____________________________________________  RADIOLOGY  I personally reviewed all radiographic images ordered to evaluate for the above acute complaints and reviewed radiology reports and findings.  These findings were personally discussed with the patient.  Please see medical record for radiology report.  ____________________________________________   PROCEDURES  Procedure(s) performed: none    Critical Care performed: no ____________________________________________   INITIAL IMPRESSION / ASSESSMENT AND PLAN / ED COURSE  Pertinent labs & imaging results that were available during my care of the patient were reviewed by me and considered in my medical  decision making (see  chart for details).  DDX: Gastritis, antritis, cholecystitis, pancreatitis, acute gastroenteritis  Carrie Alvarez is a 65 y.o. who presents to the ED with acute epigastric pain. Patient afebrile and hemodynamic stable but does appear uncomfortable. Based on her abdominal exam I am concerned about acute intra-abdominal process therefore will order CT imaging to further evaluate.  The patient will be placed on continuous pulse oximetry and telemetry for monitoring.  Laboratory evaluation will be sent to evaluate for the above complaints.     Clinical Course  Comment By Time  Patient does have elevated bilirubin with transaminitis with a normal lipase. Based on this will order right upper quadrant ultrasound is able to get this test more rapidly and presentation more consistent with acute cholecystitis at this time. Merlyn Lot, MD 10/07 1025  Ultrasound is not confirmatory. We'll proceed with CT imaging. Merlyn Lot, MD 10/07 1121  Case was discussed with patient. Based on her findings and elevated transaminase and bilirubin do feel patient will require ERCP. Importantly do not have that facility at this hospital at that time. Will speak with Hospitalist regarding Transfer for Further Evaluation and Management. Merlyn Lot, MD 10/07 1221  Spoke with gastroenterology at [facility] who is requesting MRCP to be performed prior to transfer. Merlyn Lot, MD 10/07 1323  Patient currently undergoing MRCP.Patient out to Dr. Joni Fears. Plan is to await results of MRCP should there be evidence of stone patient will need transfer to [facility] for GI ERCP. If there is no evidence of stone plan will be to touch base with general surgery here at [facility] for surgical consultation.  Have discussed with the patient and available family all diagnostics and treatments performed thus far and all questions were answered to the best of my ability. The patient  demonstrates understanding and agreement with plan.  Merlyn Lot, MD 10/07 1519     ____________________________________________   FINAL CLINICAL IMPRESSION(S) / ED DIAGNOSES  Final diagnoses:  RUQ abdominal pain      NEW MEDICATIONS STARTED DURING THIS VISIT:  New Prescriptions   No medications on file     Note:  This document was prepared using Dragon voice recognition software and may include unintentional dictation errors.    Merlyn Lot, MD 04/18/16 [phone]

## 2016-04-18 NOTE — H&P (Signed)
Carrie Alvarez is an 65 y.o. female.   Chief Complaint: RUQ/epigastric abdominal pain HPI: 65 year old female with a past medical history of breast cancer and hypertension came in today with intractable nausea vomiting and abdominal pain in the epigastrium moving through to her back and around to the right upper quadrant. Patient states that the pain was 10 out of 10 whenever first started double her over. Patient states that there was nothing that made the pain better. Patient had eaten a peanut butter saline which around noon and then the pain started around 7 PM. Patient states she was unable to eat since that time. Patient denies ever having any twinges of pain in the area indigestion or anything like this previously. Patient has never had any family members that have had gallbladder pathology. Patient does endorse some chills but no fever and also endorses some increased flatus and feelings of constipation she denies any dysuria.  Past Medical History:  Diagnosis Date  . Allergy   . Anemia   . Back pain   . Breast cancer ([place]) 2015   LT LUMPECTOMY 12-2014 FOLLOWING CHEMO  . Carpal tunnel syndrome   . Depression   . GERD (gastroesophageal reflux disease)   . Hypertension   . Obesity   . Uterine cancer ([place]) 1994    Past Surgical History:  Procedure Laterality Date  . ABDOMINAL HYSTERECTOMY  1994   UTERINE CA  . AXILLARY LYMPH NODE BIOPSY Left 12/18/2014   Procedure: AXILLARY LYMPH NODE BIOPSY/;  Surgeon: Robert Bellow, MD;  Location: [facility] ORS;  Service: General;  Laterality: Left;  . AXILLARY LYMPH NODE DISSECTION Left 12/18/2014   Procedure: AXILLARY LYMPH NODE DISSECTION;  Surgeon: Robert Bellow, MD;  Location: [facility] ORS;  Service: General;  Laterality: Left;  . BREAST BIOPSY Left 05/2014   CORE - POS  . BREAST SURGERY    . medial branch block  11/06/2015   lumbar facet Dr. Primus Bravo  . SENTINEL NODE BIOPSY Left 12/18/2014   Procedure: SENTINEL NODE BIOPSY;  Surgeon: Robert Bellow, MD;  Location: [facility] ORS;  Service: General;  Laterality: Left;    Family History  Problem Relation Age of Onset  . Breast cancer Sister   . Colon cancer Mother   . Hypertension Mother   . Asthma Mother   . Cancer Father    Social History:  reports that she quit smoking about 21 months ago. Her smoking use included Cigarettes. She smoked 0.50 packs per day. She has never used smokeless tobacco. She reports that she does not drink alcohol or use drugs.  Allergies: No Known Allergies   (Not in a hospital admission)  Results for orders placed or performed during the hospital encounter of 04/18/16 (from the past 48 hour(s))  Troponin I     Status: None   Collection Time: 04/18/16  8:38 AM  Result Value Ref Range   Troponin I <0.03 <0.03 ng/mL  CBC with Differential/Platelet     Status: Abnormal   Collection Time: 04/18/16  8:38 AM  Result Value Ref Range   WBC 10.6 3.6 - 11.0 K/uL   RBC 4.85 3.80 - 5.20 MIL/uL   Hemoglobin 13.9 12.0 - 16.0 g/dL   HCT 39.5 35.0 - 47.0 %   MCV 81.5 80.0 - 100.0 fL   MCH 28.6 26.0 - 34.0 pg   MCHC 35.1 32.0 - 36.0 g/dL   RDW 14.0 11.5 - 14.5 %   Platelets 185 150 - 440 K/uL  Neutrophils Relative % 83 %   Neutro Abs 8.7 (H) 1.4 - 6.5 K/uL   Lymphocytes Relative 10 %   Lymphs Abs 1.1 1.0 - 3.6 K/uL   Monocytes Relative 6 %   Monocytes Absolute 0.7 0.2 - 0.9 K/uL   Eosinophils Relative 0 %   Eosinophils Absolute 0.0 0 - 0.7 K/uL   Basophils Relative 1 %   Basophils Absolute 0.1 0 - 0.1 K/uL  Comprehensive metabolic panel     Status: Abnormal   Collection Time: 04/18/16  8:38 AM  Result Value Ref Range   Sodium 138 135 - 145 mmol/L   Potassium 3.6 3.5 - 5.1 mmol/L   Chloride 106 101 - 111 mmol/L   CO2 22 22 - 32 mmol/L   Glucose, Bld 90 65 - 99 mg/dL   BUN 14 6 - 20 mg/dL   Creatinine, Ser 0.94 0.44 - 1.00 mg/dL   Calcium 9.6 8.9 - 10.3 mg/dL   Total Protein 7.6 6.5 - 8.1 g/dL   Albumin 4.0 3.5 - 5.0 g/dL   AST 281 (H) 15 -  41 U/L   ALT 272 (H) 14 - 54 U/L   Alkaline Phosphatase 249 (H) 38 - 126 U/L   Total Bilirubin 3.7 (H) 0.3 - 1.2 mg/dL   GFR calc non Af Amer >60 >60 mL/min   GFR calc Af Amer >60 >60 mL/min    Comment: (NOTE) The eGFR has been calculated using the CKD EPI equation. This calculation has not been validated in all clinical situations. eGFR's persistently <60 mL/min signify possible Chronic Kidney Disease.    Anion gap 10 5 - 15  Lipase, blood     Status: None   Collection Time: 04/18/16  8:38 AM  Result Value Ref Range   Lipase 20 11 - 51 U/L  Lactic acid, plasma     Status: None   Collection Time: 04/18/16  8:39 AM  Result Value Ref Range   Lactic Acid, Venous 1.9 0.5 - 1.9 mmol/L  Protime-INR     Status: None   Collection Time: 04/18/16 11:30 AM  Result Value Ref Range   Prothrombin Time 13.0 11.4 - 15.2 seconds   INR 0.98   APTT     Status: None   Collection Time: 04/18/16 11:30 AM  Result Value Ref Range   aPTT 30 24 - 36 seconds   Dg Chest 2 View  Result Date: 04/18/2016 CLINICAL DATA:  Epigastric pain began yesterday. LEFT breast cancer. Former smoker. Hypertension. EXAM: CHEST  2 VIEW COMPARISON:  08/20/2014. FINDINGS: Normal cardiac size. Calcified tortuous aorta. No active infiltrates or failure. Mild scarring or subsegmental atelectasis at the LEFT CP angle. No effusion or pneumothorax. Bones unremarkable. Similar appearance to priors except that the implanted central venous catheter has been removed. IMPRESSION: Chronic changes as described. Minor scarring or platelike atelectasis at the LEFT base. Electronically Signed   By: Staci Righter M.D.   On: 04/18/2016 09:12   Ct Abdomen Pelvis W Contrast  Result Date: 04/18/2016 CLINICAL DATA:  Abdominal pain with nausea and vomiting EXAM: CT ABDOMEN AND PELVIS WITH CONTRAST TECHNIQUE: Multidetector CT imaging of the abdomen and pelvis was performed using the standard protocol following bolus administration of intravenous  contrast. Oral contrast was also administered. CONTRAST:  135m ISOVUE-300 IOPAMIDOL (ISOVUE-300) INJECTION 61% COMPARISON:  CT abdomen and pelvis June 04, 2014; ultrasound right upper quadrant April 18, 2016 FINDINGS: Lower chest: There is atelectatic change in anterior segment of the left lower  lobe with questionable early pneumonia in this area. Hepatobiliary: No focal liver lesions are evident. There is mild periportal edema. There is evidence of gallbladder wall edema. There is mild common bile duct prominence without mass or calculus appreciable by CT in the common bile duct. There is no appreciable intrahepatic biliary duct dilatation. Pancreas: No pancreatic mass or inflammatory focus evident. Pancreatic duct upper normal in size. Spleen: No splenic lesions are evident. Adrenals/Urinary Tract: Adrenals appear normal bilaterally. Kidneys bilaterally show no appreciable mass or hydronephrosis on either side. There is a small extrarenal pelvis on each side, an anatomic variant. There is no renal or ureteral calculus on either side. Urinary bladder is midline with wall thickness within normal limits. Stomach/Bowel: There are multiple sigmoid diverticula without evident diverticulitis. There is subtle wall thickening in portions of the sigmoid colon with equivocal hyperemia in the wall of the sigmoid colon in the proximal to midportion. No frank diverticulitis evident. No other bowel wall thickening is evident. No free air or portal venous air. No bowel obstruction. Vascular/Lymphatic: Aorta is somewhat tortuous but nonaneurysmal. There are foci of calcification in the distal aorta. Major mesenteric vessels appear patent. There is no apparent adenopathy in the abdomen or pelvis. Reproductive: Uterus is absent. There is no pelvic mass or pelvic fluid collection. Other: Appendix appears normal. There is no ascites or abscess in the abdomen or pelvis. There are no omental lesions evident. There is a small  ventral hernia containing only fat. Musculoskeletal: There are no blastic or lytic bone lesions. There is no intramuscular or abdominal wall lesion evident. IMPRESSION: There is atelectasis in the anterior left base with questionable early pneumonia in this area. The gallbladder wall appears mildly edematous. Of note, earlier in the day, the gallbladder wall did not appear edematous by ultrasound. The etiology for this apparent discrepancy uncertain. There is mild dilatation of the common bile duct without mass or calculus evident. Given these findings, it may be prudent to consider nuclear medicine hepatobiliary imaging study to assess for cystic and common bile duct patency. There is periportal edema. This is a finding that may be seen with parenchymal liver disease such as hepatitis or with congestive heart failure. Conceivably, this finding could be seen as a result of gallbladder/biliary duct pathology. There is evidence of a degree of sigmoid colitis without diverticulitis. No bowel obstruction is evident. No abscess or evidence of perforation. There are sigmoid diverticulum which do not appear inflamed. Bowel elsewhere appears unremarkable. No abscess. Appendix appears normal. No renal or ureteral calculus. No hydronephrosis. Foci of aortic atherosclerosis noted. Small ventral hernia containing only fat. Electronically Signed   By: Lowella Grip III M.D.   On: 04/18/2016 12:00   Mr Abd W/wo Cm/mrcp  Result Date: 04/18/2016 CLINICAL DATA:  Severe epigastric pain and vomiting beginning yesterday. Cholelithiasis. Biliary ductal dilatation on recent ultrasound. EXAM: MRI ABDOMEN WITHOUT AND WITH CONTRAST (INCLUDING MRCP) TECHNIQUE: Multiplanar multisequence MR imaging of the abdomen was performed both before and after the administration of intravenous contrast. Heavily T2-weighted images of the biliary and pancreatic ducts were obtained, and three-dimensional MRCP images were rendered by post processing.  CONTRAST:  50m MULTIHANCE GADOBENATE DIMEGLUMINE 529 MG/ML IV SOLN COMPARISON:  Ultrasound and CT on 04/18/2016. FINDINGS: Lower chest: No acute findings. Hepatobiliary: No mass identified. Tiny sub-cm cyst seen in segment 7 of the right hepatic lobe. Several tiny gallstones are seen. There is mild gallbladder wall thickening and mucosal enhancement, as well as minimal pericholecystic fluid, raising suspicion  for mild or early acute cholecystitis. There is minimal dilatation of the common bile duct measuring 7 mm, however there is no evidence of choledocholithiasis or biliary stricture. Pancreas: No mass or inflammatory changes. No evidence of pancreatic ductal dilatation or pancreas divisum. Spleen:  Within normal limits in size and appearance. Adrenals/Urinary Tract: No masses identified. No evidence of hydronephrosis. Stomach/Bowel: Visualized portions within the abdomen are unremarkable. Vascular/Lymphatic: No pathologically enlarged lymph nodes identified. No abdominal aortic aneurysm demonstrated. Other:  None. Musculoskeletal:  No suspicious bone lesions identified. IMPRESSION: Tiny gallstones, mild gallbladder wall thickening, and minimal pericholecystic fluid, raising suspicion for mild early acute cholecystitis. Recommend clinical correlation, and consider nuclear medicine hepatobiliary scan for further evaluation if clinically warranted. Mild biliary dilatation with common bile duct measuring 7 mm. No radiographic evidence of choledocholithiasis or other obstructing etiology. Normal appearance of pancreas. No evidence of pancreatic ductal dilatation. Electronically Signed   By: Earle Gell M.D.   On: 04/18/2016 16:20   US Abdomen Limited Ruq  Result Date: 04/18/2016 CLINICAL DATA:  Right upper quadrant abdominal pain over the last 12 hours. Nausea and vomiting. EXAM: US ABDOMEN LIMITED - RIGHT UPPER QUADRANT COMPARISON:  CT 06/04/2014 FINDINGS: Gallbladder: Small mobile gallstones, the largest  measuring 9 mm. No gallbladder wall thickening. No Murphy sign. Common bile duct: Diameter: Dilated at 8.4 mm. Common duct stone not identified, but presumably present. Liver: No focal lesion identified. Within normal limits in parenchymal echogenicity. Intrahepatic ductal dilatation. IMPRESSION: Small mobile gallstones without sonographic evidence of cholecystitis. Intra and extra hepatic biliary ductal dilatation suggesting the presence of a common duct stone. Electronically Signed   By: Nelson Chimes M.D.   On: 04/18/2016 11:12    Review of Systems  Constitutional: Positive for chills. Negative for diaphoresis and fever.  HENT: Negative for congestion.   Respiratory: Negative for cough, shortness of breath and wheezing.   Cardiovascular: Negative for chest pain and leg swelling.  Gastrointestinal: Positive for abdominal pain, constipation, nausea and vomiting. Negative for blood in stool, diarrhea and heartburn.  Genitourinary: Negative for dysuria, flank pain and hematuria.  Musculoskeletal: Negative for back pain and joint pain.  Skin: Negative for itching and rash.  Neurological: Negative for dizziness, loss of consciousness, weakness and headaches.  Psychiatric/Behavioral: The patient is not nervous/anxious.   All other systems reviewed and are negative.   Blood pressure (!) 145/116, pulse 89, temperature 98.4 F (36.9 C), temperature source Oral, resp. rate 20, height 5' 3"  (1.6 m), weight 184 lb (83.5 kg), SpO2 98 %. Physical Exam  Vitals reviewed. Constitutional: She is oriented to person, place, and time. She appears well-developed and well-nourished. No distress.  HENT:  Head: Normocephalic and atraumatic.  Right Ear: External ear normal.  Left Ear: External ear normal.  Nose: Nose normal.  Mouth/Throat: Oropharynx is clear and moist. No oropharyngeal exudate.  Eyes: Conjunctivae and EOM are normal. Pupils are equal, round, and reactive to light. No scleral icterus.  Neck:  Normal range of motion. Neck supple. No tracheal deviation present.  Cardiovascular: Normal rate, regular rhythm, normal heart sounds and intact distal pulses.  Exam reveals no gallop and no friction rub.   No murmur heard. Respiratory: Effort normal and breath sounds normal. No respiratory distress. She has no wheezes. She has no rales.  GI: Bowel sounds are normal. She exhibits distension. She exhibits no mass. There is tenderness. There is no rebound and no guarding.  Epigastric and right upper quadrant abdominal pain no masses no rebound  Musculoskeletal: Normal range of motion. She exhibits no edema, tenderness or deformity.  Neurological: She is alert and oriented to person, place, and time. No cranial nerve deficit.  Skin: Skin is warm and dry. No rash noted. No erythema. No pallor.  Psychiatric: She has a normal mood and affect. Her behavior is normal. Judgment and thought content normal.     Assessment/Plan 65 yr old female with concern for acute cholangitis.  I have reviewed her past medical history where she does have a history of breast cancer in the past and some high blood pressure but otherwise is healthy. I have personally reviewed her laboratory values which Reveal an elevated bilirubin to 3.7, elevated alkaline phosphatase at 249, AST and ALT also elevated in the 200s. I personally reviewed her ultrasound images that she was so thickened gallbladder wall with stones and a dilated common bile duct that measures 8 mm. I also reviewed her CT scan images which show a thickened gallbladder wall but also stranding and inflammation down through the porta hepatis along the dilated common bile duct. I've also reviewed her MRCP images which also do show some stranding and inflammation along the cystic duct common bile duct confluence with some dilatation of the common bile duct however no stone is directly identified.   Given these findings on images along with her elevated bilirubin, I do  have a concern for a component of acute cholangitis as opposed to simple acute cholecystitis. I discussed this with Dr. Quentin Cornwall who we had attempted transfer to a higher level facility however GI is not available until Monday. We will have the GI ERCP [place] that is available and on call Monday. The patient should have her gallbladder removed at some point during the admission but ideally would be after antibiotics and decreasing the inflammation and having the common bile duct cleared of any stones. The patient will be admitted and placed on IV antibiotics, she can have ice chips and sips of water for comfort.   Hubbard Robinson, MD 04/18/2016, 5:57 PM

## 2016-04-18 NOTE — ED Notes (Signed)
Pt again asked for UA sample. Pt states she peed prior to MRI test and does not need to go now. Pt aware she still needs to give urine sample.

## 2016-04-18 NOTE — ED Notes (Signed)
Patient transported to MRI 

## 2016-04-18 NOTE — H&P (Signed)
[place] @ [facility] Admission History and Physical [employer], D.O.  ---------------------------------------------------------------------------------------------------------------------   PATIENT NAME: Carrie Alvarez MR#: [id number] DATE OF BIRTH: 1951/04/26 DATE OF ADMISSION: 04/18/2016 PRIMARY CARE PHYSICIAN: Mar Daring, PA-C  REQUESTING/REFERRING PHYSICIAN: ED Dr. Joni Fears  CHIEF COMPLAINT: Chief Complaint  Patient presents with  . Abdominal Pain  . Emesis  . Diarrhea    HISTORY OF PRESENT ILLNESS: Carrie Alvarez is a 65 y.o. female with a known history of Anemia, breast cancer, uterine cancer, reflux, hypertension, depression presents to the emergency department complaining of abdominal pain.  Patient was in a usual state of health until yesterday afternoon when she described sudden onset of 10 out of 10 right upper quadrant and midepigastric abdominal pain associated with bilious vomiting, loose stools. She reports that she was unable to take any by mouth intake secondary to pain. She reports chills but no documented fever.  Otherwise there has been no change in status. Patient has been taking medication as prescribed and there has been no recent change in medication or diet.  There has been no recent illness, travel or sick contacts.    Patient denies fevers/chills, weakness, dizziness, chest pain, shortness of breath, N/V/C/D, abdominal pain, dysuria/frequency, changes in mental status.   MRCP was done in the emergency department given elevations in transaminases and bilirubins. Results consistent with cholelithiasis and possibly early cholecystitis without evidence of choledocholithiasis. Surgery did evaluate the patient in the emergency department and recommended IV antibiotics prior to cholecystectomy. Hospitals were contacted for admission.   PAST MEDICAL HISTORY: Past Medical History:  Diagnosis Date  . Allergy   . Anemia   .  Back pain   . Breast cancer ([place]) 2015   LT LUMPECTOMY 12-2014 FOLLOWING CHEMO  . Carpal tunnel syndrome   . Depression   . GERD (gastroesophageal reflux disease)   . Hypertension   . Obesity   . Uterine cancer ([place]) 1994      PAST SURGICAL HISTORY: Past Surgical History:  Procedure Laterality Date  . ABDOMINAL HYSTERECTOMY  1994   UTERINE CA  . AXILLARY LYMPH NODE BIOPSY Left 12/18/2014   Procedure: AXILLARY LYMPH NODE BIOPSY/;  Surgeon: Robert Bellow, MD;  Location: [facility] ORS;  Service: General;  Laterality: Left;  . AXILLARY LYMPH NODE DISSECTION Left 12/18/2014   Procedure: AXILLARY LYMPH NODE DISSECTION;  Surgeon: Robert Bellow, MD;  Location: [facility] ORS;  Service: General;  Laterality: Left;  . BREAST BIOPSY Left 05/2014   CORE - POS  . BREAST SURGERY    . medial branch block  11/06/2015   lumbar facet Dr. Primus Bravo  . SENTINEL NODE BIOPSY Left 12/18/2014   Procedure: SENTINEL NODE BIOPSY;  Surgeon: Robert Bellow, MD;  Location: [facility] ORS;  Service: General;  Laterality: Left;      SOCIAL HISTORY: Social History  Substance Use Topics  . Smoking status: Former Smoker    Packs/day: 0.50    Types: Cigarettes    Quit date: 07/18/2014  . Smokeless tobacco: Never Used     Comment: given smoking and pain information   . Alcohol use No      FAMILY HISTORY: Family History  Problem Relation Age of Onset  . Breast cancer Sister   . Colon cancer Mother   . Hypertension Mother   . Asthma Mother   . Cancer Father      MEDICATIONS AT HOME: Prior to Admission medications   Medication Sig Start Date End Date Taking? Authorizing Provider  amLODipine (NORVASC) 2.5 MG tablet TAKE ONE TABLET BY MOUTH ONCE DAILY 04/13/16  Yes Clearnce Sorrel Burnette, PA-C  cholecalciferol (VITAMIN D) 1000 units tablet Take 1,000 Units by mouth daily.   Yes Historical Provider, MD  cyclobenzaprine (FLEXERIL) 10 MG tablet Limit one half to one tablet by mouth per day or twice per day if tolerated      NO NORFLEX Patient taking differently: 10 mg. Limit one half to one tablet by mouth per day or twice per day if tolerated     NO NORFLEX 02/24/16  Yes [name], MD  fluticasone [facility]) 50 MCG/ACT nasal spray Place 1 spray into both nostrils as needed for allergies or rhinitis.   Yes Historical Provider, MD  gabapentin (NEURONTIN) 300 MG capsule Limit 1 tablet by mouth 2-4 times per day if tolerated 02/24/16  Yes [name], MD  letrozole [facility]) 2.5 MG tablet Take 1 tablet (2.5 mg total) by mouth daily. 04/22/15  Yes [name], MD  loratadine (CLARITIN) 10 MG tablet Take 10 mg by mouth daily.   Yes Historical Provider, MD  meloxicam (MOBIC) 15 MG tablet TAKE ONE TABLET BY MOUTH ONCE DAILY 03/14/16  Yes Clearnce Sorrel Burnette, PA-C  omeprazole (PRILOSEC) 20 MG capsule Take 40 mg by mouth daily.   Yes Historical Provider, MD  pyridOXINE (VITAMIN B-6) 100 MG tablet Take 100 mg by mouth daily.   Yes Historical Provider, MD  sertraline (ZOLOFT) 50 MG tablet TAKE FOUR TABLETS BY MOUTH AT BEDTIME 03/12/16  Yes Richard Maceo Pro., MD  traMADol [facility]) 50 MG tablet Limit 1 tab by mouth 2-4 times per day if tolerated 02/24/16  Yes [name], MD  vitamin B-12 (CYANOCOBALAMIN) 500 MCG tablet Take 500 mcg by mouth daily.   Yes Historical Provider, MD  cefUROXime (CEFTIN) 250 MG tablet Take 1 tablet (250 mg total) by mouth 2 (two) times daily with a meal. 01/20/16   [name], MD      DRUG ALLERGIES: No Known Allergies   REVIEW OF SYSTEMS: CONSTITUTIONAL: No fatigue, weakness, fever, chills, weight gain/loss, headache EYES: No blurry or double vision. ENT: No tinnitus, postnasal drip, redness or soreness of the oropharynx. RESPIRATORY: No dyspnea, cough, wheeze, hemoptysis. CARDIOVASCULAR: No chest pain, orthopnea, palpitations, syncope. GASTROINTESTINAL: Positive nausea, vomiting, diarrhea, abdominal pain. No hematemesis, melena or hematochezia. GENITOURINARY: No dysuria,  frequency, hematuria. ENDOCRINE: No polyuria or nocturia. No heat or cold intolerance. HEMATOLOGY: No anemia, bruising, bleeding. INTEGUMENTARY: No rashes, ulcers, lesions. MUSCULOSKELETAL: No pain, arthritis, swelling, gout. NEUROLOGIC: No numbness, tingling, weakness or ataxia. No seizure-type activity. PSYCHIATRIC: No anxiety, depression, insomnia.  PHYSICAL EXAMINATION: VITAL SIGNS: Blood pressure (!) 145/116, pulse 89, temperature 98.4 F (36.9 C), temperature source Oral, resp. rate 20, height 5\' 3"  (1.6 m), weight 83.5 kg (184 lb), SpO2 98 %.  GENERAL: 65 y.o.-year-old black female patient, well-developed, well-nourished lying in the bed in no acute distress.  Pleasant and cooperative.   HEENT: Head atraumatic, normocephalic. Pupils equal, round, reactive to light and accommodation. No scleral icterus. Extraocular muscles intact. Oropharynx is clear. Mucus membranes moist. NECK: Supple, full range of motion. No JVD, no bruit heard. No cervical lymphadenopathy. CHEST: Normal breath sounds bilaterally. No wheezing, rales, rhonchi or crackles. No use of accessory muscles of respiration.  No reproducible chest wall tenderness.  CARDIOVASCULAR: S1, S2 normal.  No murmurs, rubs, or gallops appreciated. Cap refill <2 seconds. ABDOMEN: Soft, nondistended, significant tenderness at right upper quadrant and midepigastrum. No rebound, guarding, rigidity. Normoactive  bowel sounds present in all four quadrants. No organomegaly or mass. EXTREMITIES: Full range of motion. No pedal edema, cyanosis, or clubbing. NEUROLOGIC: Cranial nerves II through XII are grossly intact with no focal sensorimotor deficit. Muscle strength 5/5 in all extremities. Sensation intact. Gait not checked. PSYCHIATRIC: The patient is alert and oriented x 3. Normal affect, mood, thought content. SKIN: Warm, dry, and intact without obvious rash, lesion, or ulcer.  LABORATORY PANEL:  CBC  Recent Labs Lab 04/18/16 0838  WBC  10.6  HGB 13.9  HCT 39.5  PLT 185   ----------------------------------------------------------------------------------------------------------------- Chemistries  Recent Labs Lab 04/18/16 0838  NA 138  K 3.6  CL 106  CO2 22  GLUCOSE 90  BUN 14  CREATININE 0.94  CALCIUM 9.6  AST 281*  ALT 272*  ALKPHOS 249*  BILITOT 3.7*   ------------------------------------------------------------------------------------------------------------------ Cardiac Enzymes  Recent Labs Lab 04/18/16 0838  TROPONINI <0.03   ------------------------------------------------------------------------------------------------------------------  RADIOLOGY: Dg Chest 2 View  Result Date: 04/18/2016 CLINICAL DATA:  Epigastric pain began yesterday. LEFT breast cancer. Former smoker. Hypertension. EXAM: CHEST  2 VIEW COMPARISON:  08/20/2014. FINDINGS: Normal cardiac size. Calcified tortuous aorta. No active infiltrates or failure. Mild scarring or subsegmental atelectasis at the LEFT CP angle. No effusion or pneumothorax. Bones unremarkable. Similar appearance to priors except that the implanted central venous catheter has been removed. IMPRESSION: Chronic changes as described. Minor scarring or platelike atelectasis at the LEFT base. Electronically Signed   By: Staci Righter M.D.   On: 04/18/2016 09:12   Ct Abdomen Pelvis W Contrast  Result Date: 04/18/2016 CLINICAL DATA:  Abdominal pain with nausea and vomiting EXAM: CT ABDOMEN AND PELVIS WITH CONTRAST TECHNIQUE: Multidetector CT imaging of the abdomen and pelvis was performed using the standard protocol following bolus administration of intravenous contrast. Oral contrast was also administered. CONTRAST:  161mL ISOVUE-300 IOPAMIDOL (ISOVUE-300) INJECTION 61% COMPARISON:  CT abdomen and pelvis June 04, 2014; ultrasound right upper quadrant April 18, 2016 FINDINGS: Lower chest: There is atelectatic change in anterior segment of the left lower lobe with  questionable early pneumonia in this area. Hepatobiliary: No focal liver lesions are evident. There is mild periportal edema. There is evidence of gallbladder wall edema. There is mild common bile duct prominence without mass or calculus appreciable by CT in the common bile duct. There is no appreciable intrahepatic biliary duct dilatation. Pancreas: No pancreatic mass or inflammatory focus evident. Pancreatic duct upper normal in size. Spleen: No splenic lesions are evident. Adrenals/Urinary Tract: Adrenals appear normal bilaterally. Kidneys bilaterally show no appreciable mass or hydronephrosis on either side. There is a small extrarenal pelvis on each side, an anatomic variant. There is no renal or ureteral calculus on either side. Urinary bladder is midline with wall thickness within normal limits. Stomach/Bowel: There are multiple sigmoid diverticula without evident diverticulitis. There is subtle wall thickening in portions of the sigmoid colon with equivocal hyperemia in the wall of the sigmoid colon in the proximal to midportion. No frank diverticulitis evident. No other bowel wall thickening is evident. No free air or portal venous air. No bowel obstruction. Vascular/Lymphatic: Aorta is somewhat tortuous but nonaneurysmal. There are foci of calcification in the distal aorta. Major mesenteric vessels appear patent. There is no apparent adenopathy in the abdomen or pelvis. Reproductive: Uterus is absent. There is no pelvic mass or pelvic fluid collection. Other: Appendix appears normal. There is no ascites or abscess in the abdomen or pelvis. There are no omental lesions evident. There is a small  ventral hernia containing only fat. Musculoskeletal: There are no blastic or lytic bone lesions. There is no intramuscular or abdominal wall lesion evident. IMPRESSION: There is atelectasis in the anterior left base with questionable early pneumonia in this area. The gallbladder wall appears mildly edematous. Of  note, earlier in the day, the gallbladder wall did not appear edematous by ultrasound. The etiology for this apparent discrepancy uncertain. There is mild dilatation of the common bile duct without mass or calculus evident. Given these findings, it may be prudent to consider nuclear medicine hepatobiliary imaging study to assess for cystic and common bile duct patency. There is periportal edema. This is a finding that may be seen with parenchymal liver disease such as hepatitis or with congestive heart failure. Conceivably, this finding could be seen as a result of gallbladder/biliary duct pathology. There is evidence of a degree of sigmoid colitis without diverticulitis. No bowel obstruction is evident. No abscess or evidence of perforation. There are sigmoid diverticulum which do not appear inflamed. Bowel elsewhere appears unremarkable. No abscess. Appendix appears normal. No renal or ureteral calculus. No hydronephrosis. Foci of aortic atherosclerosis noted. Small ventral hernia containing only fat. Electronically Signed   By: Lowella Grip III M.D.   On: 04/18/2016 12:00   Mr Abd W/wo Cm/mrcp  Result Date: 04/18/2016 CLINICAL DATA:  Severe epigastric pain and vomiting beginning yesterday. Cholelithiasis. Biliary ductal dilatation on recent ultrasound. EXAM: MRI ABDOMEN WITHOUT AND WITH CONTRAST (INCLUDING MRCP) TECHNIQUE: Multiplanar multisequence MR imaging of the abdomen was performed both before and after the administration of intravenous contrast. Heavily T2-weighted images of the biliary and pancreatic ducts were obtained, and three-dimensional MRCP images were rendered by post processing. CONTRAST:  14mL MULTIHANCE GADOBENATE DIMEGLUMINE 529 MG/ML IV SOLN COMPARISON:  Ultrasound and CT on 04/18/2016. FINDINGS: Lower chest: No acute findings. Hepatobiliary: No mass identified. Tiny sub-cm cyst seen in segment 7 of the right hepatic lobe. Several tiny gallstones are seen. There is mild gallbladder  wall thickening and mucosal enhancement, as well as minimal pericholecystic fluid, raising suspicion for mild or early acute cholecystitis. There is minimal dilatation of the common bile duct measuring 7 mm, however there is no evidence of choledocholithiasis or biliary stricture. Pancreas: No mass or inflammatory changes. No evidence of pancreatic ductal dilatation or pancreas divisum. Spleen:  Within normal limits in size and appearance. Adrenals/Urinary Tract: No masses identified. No evidence of hydronephrosis. Stomach/Bowel: Visualized portions within the abdomen are unremarkable. Vascular/Lymphatic: No pathologically enlarged lymph nodes identified. No abdominal aortic aneurysm demonstrated. Other:  None. Musculoskeletal:  No suspicious bone lesions identified. IMPRESSION: Tiny gallstones, mild gallbladder wall thickening, and minimal pericholecystic fluid, raising suspicion for mild early acute cholecystitis. Recommend clinical correlation, and consider nuclear medicine hepatobiliary scan for further evaluation if clinically warranted. Mild biliary dilatation with common bile duct measuring 7 mm. No radiographic evidence of choledocholithiasis or other obstructing etiology. Normal appearance of pancreas. No evidence of pancreatic ductal dilatation. Electronically Signed   By: Earle Gell M.D.   On: 04/18/2016 16:20   US Abdomen Limited Ruq  Result Date: 04/18/2016 CLINICAL DATA:  Right upper quadrant abdominal pain over the last 12 hours. Nausea and vomiting. EXAM: US ABDOMEN LIMITED - RIGHT UPPER QUADRANT COMPARISON:  CT 06/04/2014 FINDINGS: Gallbladder: Small mobile gallstones, the largest measuring 9 mm. No gallbladder wall thickening. No Murphy sign. Common bile duct: Diameter: Dilated at 8.4 mm. Common duct stone not identified, but presumably present. Liver: No focal lesion identified. Within normal limits in parenchymal  echogenicity. Intrahepatic ductal dilatation. IMPRESSION: Small mobile  gallstones without sonographic evidence of cholecystitis. Intra and extra hepatic biliary ductal dilatation suggesting the presence of a common duct stone. Electronically Signed   By: Nelson Chimes M.D.   On: 04/18/2016 11:12    EKG: Normal sinus rhythm at 80 bpm bpm with normal axis and nonspecific ST-T wave changes.   IMPRESSION AND PLAN:  This is a 65 y.o. female with a history of Anemia, breast cancer, uterine cancer, reflux, hypertension, depression  now being admitted with: 1. Cholangitis and early cholecystitis-admit to inpatient for IV fluid hydration, IV antibiotics with Zosyn, surgery to follow and plan for cholecystectomy. Patient is nothing by mouth with ice chips and sips of water with medications. Pain control, antiemetics. GI consultation has been requested also we do not have GI who could do an ERCP until Monday, 04/20/2016. 2. History of hypertension-continue Norvasc 3. History of allergies-continue Flonase and Claritin 4. History of GERD-continue Protonix 5. History of depression-continue Zoloft 6. History of breast cancer-continue Femara 7. History of chronic pain-continue Flexeril, Neurontin and tramadol. Hold Mobic. Continue vitamin D, vitamin B12 and vitamin B6.   Diet/Nutrition: Nothing by mouth with ice chips and sips of water Fluids: IV normal saline DVT Px: Lovenox, SCDs and early ambulation Code Status: Full  All the records are reviewed and case discussed with ED provider. Management plans discussed with the patient and/or family who express understanding and agree with plan of care.   TOTAL TIME TAKING CARE OF THIS PATIENT: 60 minutes.   Timica Marcom D.O. on 04/18/2016 at 5:37 PM Between 7am to 6pm - Pager - [phone] After 6pm go to [email or web address] - password [email or web address] [facility] [place] [facility] Office [phone] CC: Primary care physician; Mar Daring, PA-C     Note: This dictation was prepared with Dragon dictation along  with smaller phrase technology. Any transcriptional errors that result from this process are unintentional.

## 2016-04-19 ENCOUNTER — Encounter
Admission: EM | Disposition: A | Discharge status: Home / Self Care | Payer: Self-pay | Source: Home / Self Care | Attending: Specialist

## 2016-04-19 ENCOUNTER — Encounter: Payer: Self-pay | Admitting: Anesthesiology

## 2016-04-19 ENCOUNTER — Inpatient Hospital Stay: Payer: Commercial Managed Care - HMO | Admitting: Certified Registered"

## 2016-04-19 ENCOUNTER — Inpatient Hospital Stay: Payer: Commercial Managed Care - HMO

## 2016-04-19 DIAGNOSIS — K830 Cholangitis: Secondary | ICD-10-CM

## 2016-04-19 DIAGNOSIS — R740 Nonspecific elevation of levels of transaminase and lactic acid dehydrogenase [LDH]: Secondary | ICD-10-CM

## 2016-04-19 DIAGNOSIS — K810 Acute cholecystitis: Secondary | ICD-10-CM

## 2016-04-19 DIAGNOSIS — R7401 Elevation of levels of liver transaminase levels: Secondary | ICD-10-CM

## 2016-04-19 HISTORY — PX: CHOLECYSTECTOMY: SHX55

## 2016-04-19 LAB — CBC
HCT: 35.4 % (ref 35.0–47.0)
Hemoglobin: 12.5 g/dL (ref 12.0–16.0)
MCH: 28.8 pg (ref 26.0–34.0)
MCHC: 35.2 g/dL (ref 32.0–36.0)
MCV: 81.8 fL (ref 80.0–100.0)
Platelets: 171 10*3/uL (ref 150–440)
RBC: 4.33 MIL/uL (ref 3.80–5.20)
RDW: 14 % (ref 11.5–14.5)
WBC: 6 10*3/uL (ref 3.6–11.0)

## 2016-04-19 LAB — COMPREHENSIVE METABOLIC PANEL
ALT: 195 U/L — ABNORMAL HIGH (ref 14–54)
AST: 127 U/L — ABNORMAL HIGH (ref 15–41)
Albumin: 3.5 g/dL (ref 3.5–5.0)
Alkaline Phosphatase: 216 U/L — ABNORMAL HIGH (ref 38–126)
Anion gap: 7 (ref 5–15)
BUN: 14 mg/dL (ref 6–20)
CO2: 24 mmol/L (ref 22–32)
Calcium: 9.2 mg/dL (ref 8.9–10.3)
Chloride: 108 mmol/L (ref 101–111)
Creatinine, Ser: 1.12 mg/dL — ABNORMAL HIGH (ref 0.44–1.00)
GFR calc Af Amer: 58 mL/min — ABNORMAL LOW (ref >60)
GFR calc non Af Amer: 50 mL/min — ABNORMAL LOW (ref >60)
Glucose, Bld: 82 mg/dL (ref 65–99)
Potassium: 3.6 mmol/L (ref 3.5–5.1)
Sodium: 139 mmol/L (ref 135–145)
Total Bilirubin: 1.1 mg/dL (ref 0.3–1.2)
Total Protein: 6.8 g/dL (ref 6.5–8.1)

## 2016-04-19 LAB — MRSA PCR SCREENING: MRSA by PCR: NEGATIVE

## 2016-04-19 SURGERY — LAPAROSCOPIC CHOLECYSTECTOMY WITH INTRAOPERATIVE CHOLANGIOGRAM
Anesthesia: General

## 2016-04-19 MED ORDER — ESMOLOL HCL 100 MG/10ML IV SOLN
INTRAVENOUS | Status: DC | PRN
  Administered 2016-04-19: 40 mg via INTRAVENOUS

## 2016-04-19 MED ORDER — ONDANSETRON HCL 4 MG/2ML IJ SOLN
INTRAMUSCULAR | Status: AC
  Filled 2016-04-19: qty 2

## 2016-04-19 MED ORDER — LIDOCAINE HCL (CARDIAC) 20 MG/ML IV SOLN
INTRAVENOUS | Status: DC | PRN
  Administered 2016-04-19: 100 mg via INTRAVENOUS

## 2016-04-19 MED ORDER — ONDANSETRON HCL 4 MG/2ML IJ SOLN
INTRAMUSCULAR | Status: DC | PRN
  Administered 2016-04-19: 4 mg via INTRAVENOUS

## 2016-04-19 MED ORDER — SCOPOLAMINE 1 MG/3DAYS TD PT72: 1.0000 | MEDICATED_PATCH | TRANSDERMAL | Status: DC

## 2016-04-19 MED ORDER — BUPIVACAINE HCL (PF) 0.5 % IJ SOLN
INTRAMUSCULAR | Status: AC
  Filled 2016-04-19: qty 30

## 2016-04-19 MED ORDER — CYCLOBENZAPRINE HCL 10 MG PO TABS
10.0000 mg | ORAL_TABLET | Freq: Three times a day (TID) | ORAL | Status: DC
  Administered 2016-04-19 – 2016-04-21 (×5): 10 mg via ORAL
  Filled 2016-04-19 (×5): qty 1

## 2016-04-19 MED ORDER — LABETALOL HCL 5 MG/ML IV SOLN
INTRAVENOUS | Status: DC | PRN
  Administered 2016-04-19: 5 mg via INTRAVENOUS

## 2016-04-19 MED ORDER — MORPHINE SULFATE (PF) 2 MG/ML IV SOLN
1.0000 mg | INTRAVENOUS | Status: DC | PRN
  Administered 2016-04-19 – 2016-04-20 (×2): 1 mg via INTRAVENOUS
  Filled 2016-04-19 (×2): qty 1

## 2016-04-19 MED ORDER — MIDAZOLAM HCL 2 MG/2ML IJ SOLN
INTRAMUSCULAR | Status: DC | PRN
  Administered 2016-04-19: 2 mg via INTRAVENOUS

## 2016-04-19 MED ORDER — SODIUM CHLORIDE 0.9 % IV SOLN
INTRAVENOUS | Status: DC | PRN
  Administered 2016-04-19: 20 mL

## 2016-04-19 MED ORDER — ONDANSETRON HCL 4 MG/2ML IJ SOLN
4.0000 mg | Freq: Once | INTRAMUSCULAR | Status: AC | PRN
  Administered 2016-04-19: 4 mg via INTRAVENOUS

## 2016-04-19 MED ORDER — OXYCODONE HCL 5 MG PO TABS
10.0000 mg | ORAL_TABLET | ORAL | Status: DC | PRN
  Administered 2016-04-20 (×2): 10 mg via ORAL
  Filled 2016-04-19 (×3): qty 2

## 2016-04-19 MED ORDER — INFLUENZA VAC SPLIT QUAD 0.5 ML IM SUSY
0.5000 mL | PREFILLED_SYRINGE | INTRAMUSCULAR | Status: AC
  Administered 2016-04-20: 0.5 mL via INTRAMUSCULAR
  Filled 2016-04-19: qty 0.5

## 2016-04-19 MED ORDER — SUGAMMADEX SODIUM 200 MG/2ML IV SOLN
INTRAVENOUS | Status: DC | PRN
  Administered 2016-04-19: 170 mg via INTRAVENOUS

## 2016-04-19 MED ORDER — HYDROMORPHONE HCL 1 MG/ML IJ SOLN
0.5000 mg | INTRAMUSCULAR | Status: DC | PRN
  Administered 2016-04-19: 1 mg via INTRAVENOUS
  Filled 2016-04-19: qty 1

## 2016-04-19 MED ORDER — DEXAMETHASONE SODIUM PHOSPHATE 10 MG/ML IJ SOLN
INTRAMUSCULAR | Status: DC | PRN
  Administered 2016-04-19: 4 mg via INTRAVENOUS

## 2016-04-19 MED ORDER — SCOPOLAMINE 1 MG/3DAYS TD PT72
MEDICATED_PATCH | TRANSDERMAL | Status: AC
Start: 2016-04-19 — End: 2016-04-20
  Filled 2016-04-19: qty 1

## 2016-04-19 MED ORDER — LACTATED RINGERS IV SOLN
INTRAVENOUS | Status: DC | PRN
  Administered 2016-04-19: 15:00:00 via INTRAVENOUS

## 2016-04-19 MED ORDER — PROPOFOL 10 MG/ML IV BOLUS
INTRAVENOUS | Status: DC | PRN
  Administered 2016-04-19: 140 mg via INTRAVENOUS
  Administered 2016-04-19 (×5): 30 mg via INTRAVENOUS

## 2016-04-19 MED ORDER — PROMETHAZINE HCL 25 MG RE SUPP
25.0000 mg | Freq: Once | RECTAL | Status: AC
  Administered 2016-04-19: 25 mg via RECTAL
  Filled 2016-04-19: qty 1

## 2016-04-19 MED ORDER — ACETAMINOPHEN 325 MG PO TABS
650.0000 mg | ORAL_TABLET | Freq: Four times a day (QID) | ORAL | Status: DC
  Administered 2016-04-19 – 2016-04-21 (×6): 650 mg via ORAL
  Filled 2016-04-19 (×6): qty 2

## 2016-04-19 MED ORDER — HYDRALAZINE HCL 20 MG/ML IJ SOLN
INTRAMUSCULAR | Status: DC | PRN
  Administered 2016-04-19 (×2): 10 mg via INTRAVENOUS

## 2016-04-19 MED ORDER — BUPIVACAINE HCL (PF) 0.5 % IJ SOLN
INTRAMUSCULAR | Status: DC | PRN
  Administered 2016-04-19: 30 mL

## 2016-04-19 MED ORDER — FENTANYL CITRATE (PF) 100 MCG/2ML IJ SOLN
INTRAMUSCULAR | Status: AC
  Filled 2016-04-19: qty 4

## 2016-04-19 MED ORDER — FENTANYL CITRATE (PF) 100 MCG/2ML IJ SOLN
25.0000 ug | INTRAMUSCULAR | Status: AC | PRN
  Administered 2016-04-19 (×6): 25 ug via INTRAVENOUS

## 2016-04-19 MED ORDER — ROCURONIUM BROMIDE 100 MG/10ML IV SOLN
INTRAVENOUS | Status: DC | PRN
  Administered 2016-04-19: 5 mg via INTRAVENOUS
  Administered 2016-04-19: 40 mg via INTRAVENOUS
  Administered 2016-04-19: 5 mg via INTRAVENOUS

## 2016-04-19 MED ORDER — FENTANYL CITRATE (PF) 100 MCG/2ML IJ SOLN
INTRAMUSCULAR | Status: DC | PRN
  Administered 2016-04-19: 100 ug via INTRAVENOUS
  Administered 2016-04-19 (×2): 25 ug via INTRAVENOUS
  Administered 2016-04-19: 50 ug via INTRAVENOUS

## 2016-04-19 MED ORDER — FENTANYL CITRATE (PF) 100 MCG/2ML IJ SOLN
INTRAMUSCULAR | Status: AC
  Filled 2016-04-19: qty 2

## 2016-04-19 SURGICAL SUPPLY — 41 items
APPLIER CLIP 5 13 M/L LIGAMAX5 (MISCELLANEOUS) ×2
BLADE SURG 15 STRL LF DISP TIS (BLADE) ×1 IMPLANT
BLADE SURG 15 STRL SS (BLADE) ×1
CANISTER SUCT 1200ML W/VALVE (MISCELLANEOUS) ×2 IMPLANT
CATH CHOLANGI 4FR 420404F (CATHETERS) ×4 IMPLANT
CATH REDDICK CHOLANGI 4FR 50CM (CATHETERS) ×2 IMPLANT
CHLORAPREP W/TINT 26ML (MISCELLANEOUS) ×2 IMPLANT
CLIP APPLIE 5 13 M/L LIGAMAX5 (MISCELLANEOUS) ×1 IMPLANT
CONRAY 60ML FOR OR (MISCELLANEOUS) ×2 IMPLANT
DEFOGGER SCOPE WARMER CLEARIFY (MISCELLANEOUS) ×2 IMPLANT
DRAPE SHEET LG 3/4 BI-LAMINATE (DRAPES) ×2 IMPLANT
ELECT CAUTERY BLADE 6.4 (BLADE) ×2 IMPLANT
ELECT E-Z MONOPOLAR 33 (MISCELLANEOUS) ×2
ELECT REM PT RETURN 9FT ADLT (ELECTROSURGICAL) ×2
ELECTRODE E-Z MONOPOLAR 33 (MISCELLANEOUS) ×1 IMPLANT
ELECTRODE REM PT RTRN 9FT ADLT (ELECTROSURGICAL) ×1 IMPLANT
ENDOPOUCH RETRIEVER 10 (MISCELLANEOUS) ×2 IMPLANT
GLOVE PI ORTHOPRO 6.5 (GLOVE) ×1
GLOVE PI ORTHOPRO STRL 6.5 (GLOVE) ×1 IMPLANT
GOWN STRL REUS W/ TWL LRG LVL3 (GOWN DISPOSABLE) ×3 IMPLANT
GOWN STRL REUS W/TWL LRG LVL3 (GOWN DISPOSABLE) ×3
IRRIGATION STRYKERFLOW (MISCELLANEOUS) ×1 IMPLANT
IRRIGATOR STRYKERFLOW (MISCELLANEOUS) ×2
IV CATH ANGIO 12GX3 LT BLUE (NEEDLE) ×2 IMPLANT
IV NS 1000ML (IV SOLUTION) ×1
IV NS 1000ML BAXH (IV SOLUTION) ×1 IMPLANT
LABEL OR SOLS (LABEL) ×2 IMPLANT
LIQUID BAND (GAUZE/BANDAGES/DRESSINGS) ×2 IMPLANT
NEEDLE HYPO 25X1 1.5 SAFETY (NEEDLE) ×2 IMPLANT
NS IRRIG 500ML POUR BTL (IV SOLUTION) ×2 IMPLANT
PACK LAP CHOLECYSTECTOMY (MISCELLANEOUS) ×2 IMPLANT
PENCIL ELECTRO HAND CTR (MISCELLANEOUS) ×2 IMPLANT
SCISSORS METZENBAUM CVD 33 (INSTRUMENTS) ×2 IMPLANT
SLEEVE ENDOPATH XCEL 5M (ENDOMECHANICALS) ×4 IMPLANT
SUT MNCRL 4-0 (SUTURE) ×1
SUT MNCRL 4-0 27XMFL (SUTURE) ×1
SUT VICRYL 0 AB UR-6 (SUTURE) ×4 IMPLANT
SUTURE MNCRL 4-0 27XMF (SUTURE) ×1 IMPLANT
TROCAR XCEL BLUNT TIP 100MML (ENDOMECHANICALS) ×2 IMPLANT
TROCAR XCEL NON-BLD 5MMX100MML (ENDOMECHANICALS) ×2 IMPLANT
TUBING INSUFFLATOR HI FLOW (MISCELLANEOUS) ×2 IMPLANT

## 2016-04-19 NOTE — Transfer of Care (Signed)
Immediate Anesthesia Transfer of Care Note  Patient: Norm Salt  Procedure(s) Performed: Procedure(s): LAPAROSCOPIC CHOLECYSTECTOMY WITH INTRAOPERATIVE CHOLANGIOGRAM (N/A)  Patient Location: PACU  Anesthesia Type:General  Level of Consciousness: sedated and responds to stimulation  Airway & Oxygen Therapy: Patient Spontanous Breathing and Patient connected to face mask oxygen  Post-op Assessment: Report given to RN and Post -op Vital signs reviewed and stable  Post vital signs: Reviewed and stable  Last Vitals:  Vitals:   04/19/16 1300 04/19/16 1648  BP: 131/72 (!) 165/100  Pulse: 81   Resp: 18 15  Temp: 36.6 C     Last Pain:  Vitals:   04/19/16 1300  TempSrc: Oral  PainSc:          Complications: No apparent anesthesia complications

## 2016-04-19 NOTE — Care Management Important Message (Signed)
Important Message  Patient Details  Name: Carrie Alvarez MRN: [id number] Date of Birth: [birth year]   Medicare Important Message Given:  Yes    Dave Mergen A, RN 04/19/2016, 2:45 PM

## 2016-04-19 NOTE — Progress Notes (Signed)
[facility] NAME: Carrie Alvarez    MR#:  [id number]  DATE OF BIRTH:  1950-09-13  SUBJECTIVE:   Patient is here due to abdominal pain nausea and vomiting and noted to have abnormal LFTs and ultrasound and CT scan findings suggestive of cholangitis/cholecystitis. Patient's LFTs are much improved today, her bilirubins are back down to normal. She currently denies any abdominal pain. Going to OR later today for Lap. Cholecystectomy.   REVIEW OF SYSTEMS:    Review of Systems  Constitutional: Negative for chills and fever.  HENT: Negative for congestion and tinnitus.   Eyes: Negative for blurred vision and double vision.  Respiratory: Negative for cough, shortness of breath and wheezing.   Cardiovascular: Negative for chest pain, orthopnea and PND.  Gastrointestinal: Positive for abdominal pain, nausea and vomiting. Negative for diarrhea.  Genitourinary: Negative for dysuria and hematuria.  Neurological: Negative for dizziness, sensory change and focal weakness.  All other systems reviewed and are negative.   Nutrition: NPO Tolerating Diet: NO Tolerating PT: Await eval.    DRUG ALLERGIES:  No Known Allergies  VITALS:  Blood pressure (!) 142/96, pulse 82, temperature 97.8 F (36.6 C), temperature source Oral, resp. rate 16, height 5\' 3"  (1.6 m), weight 84.4 kg (186 lb), SpO2 100 %.  PHYSICAL EXAMINATION:   Physical Exam  GENERAL:  65 y.o.-year-old patient lying in the bed with no acute distress.  EYES: Pupils equal, round, reactive to light and accommodation. No scleral icterus. Extraocular muscles intact.  HEENT: Head atraumatic, normocephalic. Oropharynx and nasopharynx clear.  NECK:  Supple, no jugular venous distention. No thyroid enlargement, no tenderness.  LUNGS: Normal breath sounds bilaterally, no wheezing, rales, rhonchi. No use of accessory muscles of respiration.  CARDIOVASCULAR: S1, S2 normal. No murmurs,  rubs, or gallops.  ABDOMEN: Soft, RUQ abdominal pain, No rebound, rigidity, nondistended. Bowel sounds present. No organomegaly or mass.  EXTREMITIES: No cyanosis, clubbing or edema b/l.    NEUROLOGIC: Cranial nerves II through XII are intact. No focal Motor or sensory deficits b/l.   PSYCHIATRIC: The patient is alert and oriented x 3.  SKIN: No obvious rash, lesion, or ulcer.    LABORATORY PANEL:   CBC  Recent Labs Lab 04/19/16 0545  WBC 6.0  HGB 12.5  HCT 35.4  PLT 171   ------------------------------------------------------------------------------------------------------------------  Chemistries   Recent Labs Lab 04/18/16 0838 04/19/16 0545  NA 138 139  K 3.6 3.6  CL 106 108  CO2 22 24  GLUCOSE 90 82  BUN 14 14  CREATININE 0.94 1.12*  CALCIUM 9.6 9.2  MG 1.8  --   AST 281* 127*  ALT 272* 195*  ALKPHOS 249* 216*  BILITOT 3.7* 1.1   ------------------------------------------------------------------------------------------------------------------  Cardiac Enzymes  Recent Labs Lab 04/18/16 0838  TROPONINI <0.03   ------------------------------------------------------------------------------------------------------------------  RADIOLOGY:  Dg Chest 2 View  Result Date: 04/18/2016 CLINICAL DATA:  Epigastric pain began yesterday. LEFT breast cancer. Former smoker. Hypertension. EXAM: CHEST  2 VIEW COMPARISON:  08/20/2014. FINDINGS: Normal cardiac size. Calcified tortuous aorta. No active infiltrates or failure. Mild scarring or subsegmental atelectasis at the LEFT CP angle. No effusion or pneumothorax. Bones unremarkable. Similar appearance to priors except that the implanted central venous catheter has been removed. IMPRESSION: Chronic changes as described. Minor scarring or platelike atelectasis at the LEFT base. Electronically Signed   By: Staci Righter M.D.   On: 04/18/2016 09:12   Ct Abdomen Pelvis W Contrast  Result Date:  04/18/2016 CLINICAL DATA:   Abdominal pain with nausea and vomiting EXAM: CT ABDOMEN AND PELVIS WITH CONTRAST TECHNIQUE: Multidetector CT imaging of the abdomen and pelvis was performed using the standard protocol following bolus administration of intravenous contrast. Oral contrast was also administered. CONTRAST:  162mL ISOVUE-300 IOPAMIDOL (ISOVUE-300) INJECTION 61% COMPARISON:  CT abdomen and pelvis June 04, 2014; ultrasound right upper quadrant April 18, 2016 FINDINGS: Lower chest: There is atelectatic change in anterior segment of the left lower lobe with questionable early pneumonia in this area. Hepatobiliary: No focal liver lesions are evident. There is mild periportal edema. There is evidence of gallbladder wall edema. There is mild common bile duct prominence without mass or calculus appreciable by CT in the common bile duct. There is no appreciable intrahepatic biliary duct dilatation. Pancreas: No pancreatic mass or inflammatory focus evident. Pancreatic duct upper normal in size. Spleen: No splenic lesions are evident. Adrenals/Urinary Tract: Adrenals appear normal bilaterally. Kidneys bilaterally show no appreciable mass or hydronephrosis on either side. There is a small extrarenal pelvis on each side, an anatomic variant. There is no renal or ureteral calculus on either side. Urinary bladder is midline with wall thickness within normal limits. Stomach/Bowel: There are multiple sigmoid diverticula without evident diverticulitis. There is subtle wall thickening in portions of the sigmoid colon with equivocal hyperemia in the wall of the sigmoid colon in the proximal to midportion. No frank diverticulitis evident. No other bowel wall thickening is evident. No free air or portal venous air. No bowel obstruction. Vascular/Lymphatic: Aorta is somewhat tortuous but nonaneurysmal. There are foci of calcification in the distal aorta. Major mesenteric vessels appear patent. There is no apparent adenopathy in the abdomen or pelvis.  Reproductive: Uterus is absent. There is no pelvic mass or pelvic fluid collection. Other: Appendix appears normal. There is no ascites or abscess in the abdomen or pelvis. There are no omental lesions evident. There is a small ventral hernia containing only fat. Musculoskeletal: There are no blastic or lytic bone lesions. There is no intramuscular or abdominal wall lesion evident. IMPRESSION: There is atelectasis in the anterior left base with questionable early pneumonia in this area. The gallbladder wall appears mildly edematous. Of note, earlier in the day, the gallbladder wall did not appear edematous by ultrasound. The etiology for this apparent discrepancy uncertain. There is mild dilatation of the common bile duct without mass or calculus evident. Given these findings, it may be prudent to consider nuclear medicine hepatobiliary imaging study to assess for cystic and common bile duct patency. There is periportal edema. This is a finding that may be seen with parenchymal liver disease such as hepatitis or with congestive heart failure. Conceivably, this finding could be seen as a result of gallbladder/biliary duct pathology. There is evidence of a degree of sigmoid colitis without diverticulitis. No bowel obstruction is evident. No abscess or evidence of perforation. There are sigmoid diverticulum which do not appear inflamed. Bowel elsewhere appears unremarkable. No abscess. Appendix appears normal. No renal or ureteral calculus. No hydronephrosis. Foci of aortic atherosclerosis noted. Small ventral hernia containing only fat. Electronically Signed   By: Lowella Grip III M.D.   On: 04/18/2016 12:00   Mr Abd W/wo Cm/mrcp  Result Date: 04/18/2016 CLINICAL DATA:  Severe epigastric pain and vomiting beginning yesterday. Cholelithiasis. Biliary ductal dilatation on recent ultrasound. EXAM: MRI ABDOMEN WITHOUT AND WITH CONTRAST (INCLUDING MRCP) TECHNIQUE: Multiplanar multisequence MR imaging of the  abdomen was performed both before and after the administration of intravenous  contrast. Heavily T2-weighted images of the biliary and pancreatic ducts were obtained, and three-dimensional MRCP images were rendered by post processing. CONTRAST:  38mL MULTIHANCE GADOBENATE DIMEGLUMINE 529 MG/ML IV SOLN COMPARISON:  Ultrasound and CT on 04/18/2016. FINDINGS: Lower chest: No acute findings. Hepatobiliary: No mass identified. Tiny sub-cm cyst seen in segment 7 of the right hepatic lobe. Several tiny gallstones are seen. There is mild gallbladder wall thickening and mucosal enhancement, as well as minimal pericholecystic fluid, raising suspicion for mild or early acute cholecystitis. There is minimal dilatation of the common bile duct measuring 7 mm, however there is no evidence of choledocholithiasis or biliary stricture. Pancreas: No mass or inflammatory changes. No evidence of pancreatic ductal dilatation or pancreas divisum. Spleen:  Within normal limits in size and appearance. Adrenals/Urinary Tract: No masses identified. No evidence of hydronephrosis. Stomach/Bowel: Visualized portions within the abdomen are unremarkable. Vascular/Lymphatic: No pathologically enlarged lymph nodes identified. No abdominal aortic aneurysm demonstrated. Other:  None. Musculoskeletal:  No suspicious bone lesions identified. IMPRESSION: Tiny gallstones, mild gallbladder wall thickening, and minimal pericholecystic fluid, raising suspicion for mild early acute cholecystitis. Recommend clinical correlation, and consider nuclear medicine hepatobiliary scan for further evaluation if clinically warranted. Mild biliary dilatation with common bile duct measuring 7 mm. No radiographic evidence of choledocholithiasis or other obstructing etiology. Normal appearance of pancreas. No evidence of pancreatic ductal dilatation. Electronically Signed   By: Earle Gell M.D.   On: 04/18/2016 16:20   US Abdomen Limited Ruq  Result Date:  04/18/2016 CLINICAL DATA:  Right upper quadrant abdominal pain over the last 12 hours. Nausea and vomiting. EXAM: US ABDOMEN LIMITED - RIGHT UPPER QUADRANT COMPARISON:  CT 06/04/2014 FINDINGS: Gallbladder: Small mobile gallstones, the largest measuring 9 mm. No gallbladder wall thickening. No Murphy sign. Common bile duct: Diameter: Dilated at 8.4 mm. Common duct stone not identified, but presumably present. Liver: No focal lesion identified. Within normal limits in parenchymal echogenicity. Intrahepatic ductal dilatation. IMPRESSION: Small mobile gallstones without sonographic evidence of cholecystitis. Intra and extra hepatic biliary ductal dilatation suggesting the presence of a common duct stone. Electronically Signed   By: Nelson Chimes M.D.   On: 04/18/2016 11:12     ASSESSMENT AND PLAN:   65 year old female with past medical history of hypertension, GERD, depression, history of breast cancer who presented to the hospital due to abdominal pain nausea vomiting and also noted to have abnormal LFTs.  1. Cholangitis/acute cholecystitis-this is the cause of patient's abdominal pain nausea vomiting abnormal LFTs. -Patient's liver function is improved since yesterday, her bilirubins are back to normal. She denies any abdominal pain nausea vomiting today. -Clinically afebrile. Discussed with surgery and the plan on taking the patient to the OR for laparoscopic cholecystectomy with intraoperative cholangiogram today. -Continue further care as per surgery. Cont. Empiric Zosyn.   2. Essential HTN - cont. Norvasc.   3. Hx of Breast Cancer - cont. Femara  4. Depression - cont. Zoloft.   5. GERD - cont. Protonix.   6. Neuropathy - cont. Gabapentin.   All the records are reviewed and case discussed with Care Management/Social Worker. Management plans discussed with the patient, family and they are in agreement.  CODE STATUS: Full code  DVT Prophylaxis: Lovenox  TOTAL TIME TAKING CARE OF THIS  PATIENT: 30 minutes.   POSSIBLE D/C IN 1-2 DAYS, DEPENDING ON CLINICAL CONDITION.   Carrie Alvarez M.D on 04/19/2016 at 12:26 PM  Between 7am to 6pm - Pager - [phone]  After 6pm go to  [email or web address] - Proofreader  [facility]  Office  [phone]  CC: Primary care physician; Mar Daring, PA-C

## 2016-04-19 NOTE — Progress Notes (Signed)
Pt sent to OR via bed. 

## 2016-04-19 NOTE — OR Nursing (Signed)
C/o nausea med given

## 2016-04-19 NOTE — Anesthesia Preprocedure Evaluation (Addendum)
Anesthesia Evaluation  Patient identified by MRN, date of birth, ID band Patient awake    Reviewed: Allergy & Precautions, NPO status , Patient's Chart, lab work & pertinent test results  History of Anesthesia Complications Negative for: history of anesthetic complications  Airway Mallampati: III  TM Distance: >3 FB Neck ROM: Full    Dental  (+) Chipped   Pulmonary former smoker,    Pulmonary exam normal        Cardiovascular hypertension, Pt. on medications Normal cardiovascular exam     Neuro/Psych PSYCHIATRIC DISORDERS Depression Carpal tunnel syndrome  Neuromuscular disease    GI/Hepatic Neg liver ROS, GERD  Medicated,  Endo/Other  negative endocrine ROS  Renal/GU negative Renal ROS  negative genitourinary   Musculoskeletal  (+) Arthritis , Osteoarthritis,    Abdominal Normal abdominal exam  (+)   Peds negative pediatric ROS (+)  Hematology  (+) anemia ,   Anesthesia Other Findings   Reproductive/Obstetrics                            Anesthesia Physical  Anesthesia Plan  ASA: II  Anesthesia Plan: General   Post-op Pain Management:    Induction: Intravenous  Airway Management Planned: Oral ETT  Additional Equipment:   Intra-op Plan:   Post-operative Plan: Extubation in OR  Informed Consent: I have reviewed the patients History and Physical, chart, labs and discussed the procedure including the risks, benefits and alternatives for the proposed anesthesia with the patient or authorized representative who has indicated his/her understanding and acceptance.   Dental advisory given  Plan Discussed with: CRNA and Surgeon  Anesthesia Plan Comments:         Anesthesia Quick Evaluation

## 2016-04-19 NOTE — Progress Notes (Signed)
65 yr old female with acute cholecystitis and some concern for cholangitis given stranding throughout porta hepatitis.  Patient feeling much better this AM.  She states still some soreness but no constant pain.   Vitals:   04/19/16 0433 04/19/16 0439  BP: (!) 151/106 (!) 142/96  Pulse: 82   Resp: 16   Temp: 97.8 F (36.6 C)    PE:  Gen: NAD Abd: soft, epigastric and RUQ tenderness but improved from yesterday Ext: 2+ pulses, no edema  CBC Latest Ref Rng & Units 04/19/2016 04/18/2016 09/27/2015  WBC 3.6 - 11.0 K/uL 6.0 10.6 8.7  Hemoglobin 12.0 - 16.0 g/dL 12.5 13.9 -  Hematocrit 35.0 - 47.0 % 35.4 39.5 34.7  Platelets 150 - 440 K/uL 171 185 259   CMP Latest Ref Rng & Units 04/19/2016 04/18/2016 09/27/2015  Glucose 65 - 99 mg/dL 82 90 85  BUN 6 - 20 mg/dL 14 14 19   Creatinine 0.44 - 1.00 mg/dL 1.12(H) 0.94 0.98  Sodium 135 - 145 mmol/L 139 138 143  Potassium 3.5 - 5.1 mmol/L 3.6 3.6 3.8  Chloride 101 - 111 mmol/L 108 106 106  CO2 22 - 32 mmol/L 24 22 20   Calcium 8.9 - 10.3 mg/dL 9.2 9.6 9.7  Total Protein 6.5 - 8.1 g/dL 6.8 7.6 6.5  Total Bilirubin 0.3 - 1.2 mg/dL 1.1 3.7(H) 0.2  Alkaline Phos 38 - 126 U/L 216(H) 249(H) 94  AST 15 - 41 U/L 127(H) 281(H) 16  ALT 14 - 54 U/L 195(H) 272(H) 12   A/P:  65 yr old female with acute cholecystitis and some concern for cholangitis given stranding throughout porta hepatitis.  Bilirubin down to normal and other LFTs improving.  She clinically has improved as well.  The risks, benefits, complications, treatment options, and expected outcomes were discussed with the patient. The possibilities of bleeding, recurrent infection, finding a normal gallbladder, perforation of viscus organs, damage to surrounding structures, bile leak, abscess formation, needing a drain placed, the need for additional procedures, reaction to medication, pulmonary aspiration,  failure to diagnose a condition, the possible need to convert to an open procedure, and creating a  complication requiring transfusion or operation were discussed with the patient. The patient and/or family concurred with the proposed plan, giving informed consent. Will go to the OR today for Laparoscopic Cholecystectomy with cholangiogram.

## 2016-04-19 NOTE — Op Note (Signed)
Laparoscopic Cholecystectomy with IOC Procedure Note  Indications: This patient presents with symptomatic gallbladder disease and will undergo laparoscopic cholecystectomy.  Pre-operative Diagnosis: Calculus of gallbladder and bile duct with acute cholecystitis with obstruction  Post-operative Diagnosis: Same  Surgeon: Hubbard Robinson   Assistants: none  Anesthesia: General endotracheal anesthesia  ASA Class: 2  Procedure Details  The patient was seen again in the Holding Room. The risks, benefits, complications, treatment options, and expected outcomes were discussed with the patient. The possibilities of reaction to medication, pulmonary aspiration, perforation of viscus, bleeding, recurrent infection, finding a normal gallbladder, the need for additional procedures, failure to diagnose a condition, the possible need to convert to an open procedure, and creating a complication requiring transfusion or operation were discussed with the patient. The patient and/or family concurred with the proposed plan, giving informed consent. The site of surgery properly noted/marked. The patient was taken to Operating Room, identified as Carrie Alvarez and the procedure verified as Laparoscopic Cholecystectomy with Intraoperative Cholangiograms. A Time Out was held and the above information confirmed.  Prior to the induction of general anesthesia, antibiotic prophylaxis was administered. General endotracheal anesthesia was then administered and tolerated well. After the induction, the abdomen was prepped in the usual sterile fashion. The patient was positioned in the supine position with the left arm comfortably tucked, along with some reverse Trendelenburg.  Local anesthetic agent was injected into the skin near the umbilicus and an incision made. The midline fascia was incised and the Hasson technique was used to introduce a 10 mm port under direct vision. It was secured with a figure of eight  Vicryl suture placed in the usual fashion. Pneumoperitoneum was then created with CO2 and tolerated well without any adverse changes in the patient's vital signs. Additional trocars were introduced under direct vision. All skin incisions were infiltrated with a local anesthetic agent before making the incision and placing the trocars.   The gallbladder was identified, the fundus grasped and retracted cephalad. Adhesions were lysed bluntly and with the electrocautery where indicated, taking care not to injure any adjacent organs or viscus. The infundibulum was grasped and retracted laterally, exposing the peritoneum overlying the triangle of Calot. This was then divided and exposed in a blunt fashion. The cystic duct was clearly identified and bluntly dissected circumferentially. The junctions of the gallbladder, cystic duct and common bile duct were clearly identified prior to the division of any linear structure.   An incision was made in the cystic duct and the cholangiogram catheter introduced. The catheter was secured using an endoclip. The study showed possible small stones or maybe air bubble but with good visualization of the distal and proximal biliary tree and good flow into the duodenum. The catheter was then removed.   The cystic duct was then doubly ligated with surgical clips on the patient side and singly clipped on the gallbladder side and divided. The cystic artery was identified, dissected free, ligated with clips and divided as well.   The gallbladder was dissected from the liver bed in retrograde fashion with the electrocautery. The gallbladder was removed. The liver bed was irrigated and inspected. Hemostasis was achieved with the electrocautery. Copious irrigation was utilized and was repeatedly aspirated until clear all particulate matter.  Pneumoperitoneum was completely reduced after viewing removal of the trocars under direct vision. The wound was thoroughly irrigated and the  fascia was then closed with a figure of eight suture; the skin was then closed with 4-0 Monocryl and a  sterile glue was applied.  Instrument, sponge, and needle counts were correct at closure and at the conclusion of the case.   Findings: Cholecystitis with Cholelithiasis  Estimated Blood Loss: Minimal         Drains: noen         Total IV Fluids: 102mL         Specimens: Gallbladder           Complications: None; patient tolerated the procedure well.         Disposition: PACU - hemodynamically stable.         Condition: stable

## 2016-04-19 NOTE — Anesthesia Procedure Notes (Signed)
Procedure Name: Intubation Performed by: Lance Muss Pre-anesthesia Checklist: Patient identified, Patient being monitored, Timeout performed, Emergency Drugs available and Suction available Patient Re-evaluated:Patient Re-evaluated prior to inductionOxygen Delivery Method: Circle system utilized Preoxygenation: Pre-oxygenation with 100% oxygen Intubation Type: IV induction Ventilation: Mask ventilation without difficulty Laryngoscope Size: Mac and 3 Grade View: Grade III Tube type: Oral Tube size: 7.0 mm Number of attempts: 2 Airway Equipment and Method: Stylet and Bougie stylet Placement Confirmation: ETT inserted through vocal cords under direct vision,  positive ETCO2 and breath sounds checked- equal and bilateral Secured at: 22 cm Tube secured with: Tape Dental Injury: Teeth and Oropharynx as per pre-operative assessment  Difficulty Due To: Difficult Airway- due to anterior larynx and Difficult Airway- due to reduced neck mobility Comments: Recommend using Mcgrath or Glidescope with first view, limited neck extension, anterior larynx.

## 2016-04-19 NOTE — Progress Notes (Signed)
Dr Burt Knack notified of pt increases pain and nausea, orders given

## 2016-04-20 ENCOUNTER — Encounter: Payer: Self-pay | Admitting: Surgery

## 2016-04-20 LAB — CBC
HCT: 37.1 % (ref 35.0–47.0)
Hemoglobin: 12.7 g/dL (ref 12.0–16.0)
MCH: 28.2 pg (ref 26.0–34.0)
MCHC: 34.4 g/dL (ref 32.0–36.0)
MCV: 82 fL (ref 80.0–100.0)
Platelets: 180 10*3/uL (ref 150–440)
RBC: 4.52 MIL/uL (ref 3.80–5.20)
RDW: 13.7 % (ref 11.5–14.5)
WBC: 11.2 10*3/uL — ABNORMAL HIGH (ref 3.6–11.0)

## 2016-04-20 LAB — COMPREHENSIVE METABOLIC PANEL
ALT: 210 U/L — ABNORMAL HIGH (ref 14–54)
AST: 148 U/L — ABNORMAL HIGH (ref 15–41)
Albumin: 3.8 g/dL (ref 3.5–5.0)
Alkaline Phosphatase: 226 U/L — ABNORMAL HIGH (ref 38–126)
Anion gap: 10 (ref 5–15)
BUN: 12 mg/dL (ref 6–20)
CO2: 22 mmol/L (ref 22–32)
Calcium: 9.8 mg/dL (ref 8.9–10.3)
Chloride: 105 mmol/L (ref 101–111)
Creatinine, Ser: 1.09 mg/dL — ABNORMAL HIGH (ref 0.44–1.00)
GFR calc Af Amer: >60 mL/min (ref >60)
GFR calc non Af Amer: 52 mL/min — ABNORMAL LOW (ref >60)
Glucose, Bld: 91 mg/dL (ref 65–99)
Potassium: 3.8 mmol/L (ref 3.5–5.1)
Sodium: 137 mmol/L (ref 135–145)
Total Bilirubin: 1.1 mg/dL (ref 0.3–1.2)
Total Protein: 7.4 g/dL (ref 6.5–8.1)

## 2016-04-20 NOTE — Progress Notes (Signed)
04/20/2016  Subjective: Patient is 1 Day Post-Op status post laparoscopic cholecystectomy. Patient had pain issues yesterday and her pain medications were adjusted. Only had some sips of water. Reports pain is better controlled this morning with less nausea.  Vital signs: Temp:  [97.2 F (36.2 C)-99.1 F (37.3 C)] 98 F (36.7 C) (10/09 0432) Pulse Rate:  [80-103] 81 (10/09 0432) Resp:  [15-22] 18 (10/09 0432) BP: (118-167)/(72-123) 139/95 (10/09 0432) SpO2:  [98 %-100 %] 100 % (10/09 0432)   Intake/Output: 10/08 0701 - 10/09 0700 In: 2103 [I.V.:2053; IV Piggyback:50] Out: 725 [Urine:700; Blood:25] Last BM Date: 04/18/16  Physical Exam: Constitutional: No acute distress Cardiac:  Regular rhythm and rate Pulm: Lungs are clear bilaterally with no respiratory distress Abdomen: Soft, nondistended, appropriately tender to palpation. The wounds are clean dry and intact with Dermabond.  Labs:   Recent Labs  04/19/16 0545 04/20/16 0645  WBC 6.0 11.2*  HGB 12.5 12.7  HCT 35.4 37.1  PLT 171 180    Recent Labs  04/19/16 0545 04/20/16 0645  NA 139 137  K 3.6 3.8  CL 108 105  CO2 24 22  GLUCOSE 82 91  BUN 14 12  CREATININE 1.12* 1.09*  CALCIUM 9.2 9.8    Recent Labs  04/18/16 1130  LABPROT 13.0  INR 0.98    Imaging: Dg Cholangiogram Operative  Result Date: 04/20/2016 CLINICAL DATA:  Acute cholecystitis EXAM: INTRAOPERATIVE CHOLANGIOGRAM TECHNIQUE: Cholangiographic images from the C-arm fluoroscopic device were submitted for interpretation post-operatively. Please see the procedural report for the amount of contrast and the fluoroscopy time utilized. COMPARISON:  None. FINDINGS: Contrast is seen filling the common bile duct. The common bile duct is dilated. There is no definitive contrast in the duodenum. IMPRESSION: Biliary obstruction cannot be excluded.  See above. Electronically Signed   By: Marybelle Killings M.D.   On: 04/20/2016 07:31     Assessment/Plan: 65 year old female postoperative day 1 status post lap cholecystectomy.  -We'll have clear liquid diet for breakfast this morning and possibly advance to low-fat diet for lunch is doing well. -Possibly discharged to home today if doing great, but low threshold to keep 1 more day due to her pain overnight. -Total bilirubin is stable at 1.1 today.   Melvyn Neth, [place]

## 2016-04-20 NOTE — Progress Notes (Signed)
Chaplain rounding visited patient to offer chaplain services and found the patient laying in bed and seemed to be in pain. When chaplain had started talking to the patient, her husband arrived and join in the conversation which focused on her health care. Patient asked chaplain to pray for healing because she really needed to get okay soon. Chaplain asked for patient's husband's name and then prayed for the patient as well as her husband.    04/20/16 1400  Clinical Encounter Type  Visited With Patient  Visit Type Initial  Spiritual Encounters  Spiritual Needs Prayer

## 2016-04-20 NOTE — Progress Notes (Signed)
[facility] [place] NAME: Carrie Alvarez    MR#:  [id number]  DATE OF BIRTH:  [birth year]  SUBJECTIVE:   S/p Lap Cholecystectomy.  POD # 1.  Still having some abdominal pain near the incision site.  NO N/V.  Afebrile.  Feels better.   REVIEW OF SYSTEMS:    Review of Systems  Constitutional: Negative for chills and fever.  HENT: Negative for congestion and tinnitus.   Eyes: Negative for blurred vision and double vision.  Respiratory: Negative for cough, shortness of breath and wheezing.   Cardiovascular: Negative for chest pain, orthopnea and PND.  Gastrointestinal: Positive for abdominal pain. Negative for diarrhea, nausea and vomiting.  Genitourinary: Negative for dysuria and hematuria.  Neurological: Negative for dizziness, sensory change and focal weakness.  All other systems reviewed and are negative.   Nutrition: Soft diet Tolerating Diet: Yes Tolerating PT: Ambulatory    DRUG ALLERGIES:  No Known Allergies  VITALS:  Blood pressure 118/88, pulse 84, temperature 98 F (36.7 C), temperature source Oral, resp. rate 18, height 5\' 3"  (1.6 m), weight 84.4 kg (186 lb), SpO2 100 %.  PHYSICAL EXAMINATION:   Physical Exam  GENERAL:  65 y.o.-year-old patient lying in the bed with no acute distress.  EYES: Pupils equal, round, reactive to light and accommodation. No scleral icterus. Extraocular muscles intact.  HEENT: Head atraumatic, normocephalic. Oropharynx and nasopharynx clear.  NECK:  Supple, no jugular venous distention. No thyroid enlargement, no tenderness.  LUNGS: Normal breath sounds bilaterally, no wheezing, rales, rhonchi. No use of accessory muscles of respiration.  CARDIOVASCULAR: S1, S2 normal. No murmurs, rubs, or gallops.  ABDOMEN: Soft, slightly tender near the trochar sites. Bowel sounds present. No organomegaly or mass.  EXTREMITIES: No cyanosis, clubbing or edema b/l.    NEUROLOGIC: Cranial nerves II  through XII are intact. No focal Motor or sensory deficits b/l.   PSYCHIATRIC: The patient is alert and oriented x 3.  SKIN: No obvious rash, lesion, or ulcer.    LABORATORY PANEL:   CBC  Recent Labs Lab 04/20/16 0645  WBC 11.2*  HGB 12.7  HCT 37.1  PLT 180   ------------------------------------------------------------------------------------------------------------------  Chemistries   Recent Labs Lab 04/18/16 0838  04/20/16 0645  NA 138  < > 137  K 3.6  < > 3.8  CL 106  < > 105  CO2 22  < > 22  GLUCOSE 90  < > 91  BUN 14  < > 12  CREATININE 0.94  < > 1.09*  CALCIUM 9.6  < > 9.8  MG 1.8  --   --   AST 281*  < > 148*  ALT 272*  < > 210*  ALKPHOS 249*  < > 226*  BILITOT 3.7*  < > 1.1  < > = values in this interval not displayed. ------------------------------------------------------------------------------------------------------------------  Cardiac Enzymes  Recent Labs Lab 04/18/16 0838  TROPONINI <0.03   ------------------------------------------------------------------------------------------------------------------  RADIOLOGY:  Dg Cholangiogram Operative  Result Date: 04/20/2016 CLINICAL DATA:  Acute cholecystitis EXAM: INTRAOPERATIVE CHOLANGIOGRAM TECHNIQUE: Cholangiographic images from the C-arm fluoroscopic device were submitted for interpretation post-operatively. Please see the procedural report for the amount of contrast and the fluoroscopy time utilized. COMPARISON:  None. FINDINGS: Contrast is seen filling the common bile duct. The common bile duct is dilated. There is no definitive contrast in the duodenum. IMPRESSION: Biliary obstruction cannot be excluded.  See above. Electronically Signed   By: Marybelle Killings M.D.   On:  04/20/2016 07:31   Ct Abdomen Pelvis W Contrast  Result Date: 04/18/2016 CLINICAL DATA:  Abdominal pain with nausea and vomiting EXAM: CT ABDOMEN AND PELVIS WITH CONTRAST TECHNIQUE: Multidetector CT imaging of the abdomen and  pelvis was performed using the standard protocol following bolus administration of intravenous contrast. Oral contrast was also administered. CONTRAST:  130mL ISOVUE-300 IOPAMIDOL (ISOVUE-300) INJECTION 61% COMPARISON:  CT abdomen and pelvis June 04, 2014; ultrasound right upper quadrant April 18, 2016 FINDINGS: Lower chest: There is atelectatic change in anterior segment of the left lower lobe with questionable early pneumonia in this area. Hepatobiliary: No focal liver lesions are evident. There is mild periportal edema. There is evidence of gallbladder wall edema. There is mild common bile duct prominence without mass or calculus appreciable by CT in the common bile duct. There is no appreciable intrahepatic biliary duct dilatation. Pancreas: No pancreatic mass or inflammatory focus evident. Pancreatic duct upper normal in size. Spleen: No splenic lesions are evident. Adrenals/Urinary Tract: Adrenals appear normal bilaterally. Kidneys bilaterally show no appreciable mass or hydronephrosis on either side. There is a small extrarenal pelvis on each side, an anatomic variant. There is no renal or ureteral calculus on either side. Urinary bladder is midline with wall thickness within normal limits. Stomach/Bowel: There are multiple sigmoid diverticula without evident diverticulitis. There is subtle wall thickening in portions of the sigmoid colon with equivocal hyperemia in the wall of the sigmoid colon in the proximal to midportion. No frank diverticulitis evident. No other bowel wall thickening is evident. No free air or portal venous air. No bowel obstruction. Vascular/Lymphatic: Aorta is somewhat tortuous but nonaneurysmal. There are foci of calcification in the distal aorta. Major mesenteric vessels appear patent. There is no apparent adenopathy in the abdomen or pelvis. Reproductive: Uterus is absent. There is no pelvic mass or pelvic fluid collection. Other: Appendix appears normal. There is no ascites or  abscess in the abdomen or pelvis. There are no omental lesions evident. There is a small ventral hernia containing only fat. Musculoskeletal: There are no blastic or lytic bone lesions. There is no intramuscular or abdominal wall lesion evident. IMPRESSION: There is atelectasis in the anterior left base with questionable early pneumonia in this area. The gallbladder wall appears mildly edematous. Of note, earlier in the day, the gallbladder wall did not appear edematous by ultrasound. The etiology for this apparent discrepancy uncertain. There is mild dilatation of the common bile duct without mass or calculus evident. Given these findings, it may be prudent to consider nuclear medicine hepatobiliary imaging study to assess for cystic and common bile duct patency. There is periportal edema. This is a finding that may be seen with parenchymal liver disease such as hepatitis or with congestive heart failure. Conceivably, this finding could be seen as a result of gallbladder/biliary duct pathology. There is evidence of a degree of sigmoid colitis without diverticulitis. No bowel obstruction is evident. No abscess or evidence of perforation. There are sigmoid diverticulum which do not appear inflamed. Bowel elsewhere appears unremarkable. No abscess. Appendix appears normal. No renal or ureteral calculus. No hydronephrosis. Foci of aortic atherosclerosis noted. Small ventral hernia containing only fat. Electronically Signed   By: Lowella Grip III M.D.   On: 04/18/2016 12:00   Mr Abd W/wo Cm/mrcp  Result Date: 04/18/2016 CLINICAL DATA:  Severe epigastric pain and vomiting beginning yesterday. Cholelithiasis. Biliary ductal dilatation on recent ultrasound. EXAM: MRI ABDOMEN WITHOUT AND WITH CONTRAST (INCLUDING MRCP) TECHNIQUE: Multiplanar multisequence MR imaging of the  abdomen was performed both before and after the administration of intravenous contrast. Heavily T2-weighted images of the biliary and pancreatic  ducts were obtained, and three-dimensional MRCP images were rendered by post processing. CONTRAST:  39mL MULTIHANCE GADOBENATE DIMEGLUMINE 529 MG/ML IV SOLN COMPARISON:  Ultrasound and CT on 04/18/2016. FINDINGS: Lower chest: No acute findings. Hepatobiliary: No mass identified. Tiny sub-cm cyst seen in segment 7 of the right hepatic lobe. Several tiny gallstones are seen. There is mild gallbladder wall thickening and mucosal enhancement, as well as minimal pericholecystic fluid, raising suspicion for mild or early acute cholecystitis. There is minimal dilatation of the common bile duct measuring 7 mm, however there is no evidence of choledocholithiasis or biliary stricture. Pancreas: No mass or inflammatory changes. No evidence of pancreatic ductal dilatation or pancreas divisum. Spleen:  Within normal limits in size and appearance. Adrenals/Urinary Tract: No masses identified. No evidence of hydronephrosis. Stomach/Bowel: Visualized portions within the abdomen are unremarkable. Vascular/Lymphatic: No pathologically enlarged lymph nodes identified. No abdominal aortic aneurysm demonstrated. Other:  None. Musculoskeletal:  No suspicious bone lesions identified. IMPRESSION: Tiny gallstones, mild gallbladder wall thickening, and minimal pericholecystic fluid, raising suspicion for mild early acute cholecystitis. Recommend clinical correlation, and consider nuclear medicine hepatobiliary scan for further evaluation if clinically warranted. Mild biliary dilatation with common bile duct measuring 7 mm. No radiographic evidence of choledocholithiasis or other obstructing etiology. Normal appearance of pancreas. No evidence of pancreatic ductal dilatation. Electronically Signed   By: Earle Gell M.D.   On: 04/18/2016 16:20   US Abdomen Limited Ruq  Result Date: 04/18/2016 CLINICAL DATA:  Right upper quadrant abdominal pain over the last 12 hours. Nausea and vomiting. EXAM: US ABDOMEN LIMITED - RIGHT UPPER QUADRANT  COMPARISON:  CT 06/04/2014 FINDINGS: Gallbladder: Small mobile gallstones, the largest measuring 9 mm. No gallbladder wall thickening. No Murphy sign. Common bile duct: Diameter: Dilated at 8.4 mm. Common duct stone not identified, but presumably present. Liver: No focal lesion identified. Within normal limits in parenchymal echogenicity. Intrahepatic ductal dilatation. IMPRESSION: Small mobile gallstones without sonographic evidence of cholecystitis. Intra and extra hepatic biliary ductal dilatation suggesting the presence of a common duct stone. Electronically Signed   By: Nelson Chimes M.D.   On: 04/18/2016 11:12     ASSESSMENT AND PLAN:   65 year old female with past medical history of hypertension, GERD, depression, history of breast cancer who presented to the hospital due to abdominal pain nausea vomiting and also noted to have abnormal LFTs.  1. Cholangitis/acute cholecystitis-this is the cause of patient's abdominal pain nausea vomiting abnormal LFTs. -s/p Lap. Chole POD # 1.  Cont. Pain control and further care as per surgery.  - LFT's stable and will monitor.  No N/V today.   - cont. Empiric Zosyn. Afebrile, hemodynamically stable.  - intraoperative cholangiogram was (-).   2. Essential HTN - cont. Norvasc.   3. Hx of Breast Cancer - cont. Femara  4. Depression - cont. Zoloft.   5. GERD - cont. Protonix.   6. Neuropathy - cont. Gabapentin.   O.k. To be discharged from Medical standpoint. Discussed w/ Surgery and will transfer to their service and sign off. Please call back if any further help needed.   All the records are reviewed and case discussed with Care Management/Social Worker. Management plans discussed with the patient, family and they are in agreement.  CODE STATUS: Full code  DVT Prophylaxis: Lovenox  TOTAL TIME TAKING CARE OF THIS PATIENT: 25 minutes.   POSSIBLE D/C IN  1-2 DAYS, DEPENDING ON CLINICAL CONDITION.   Henreitta Leber M.D on 04/20/2016 at 9:34  AM  Between 7am to 6pm - Pager - [phone]  After 6pm go to [email or web address] - Proofreader  [facility]  Office  [phone]  CC: Primary care physician; Mar Daring, PA-C

## 2016-04-21 LAB — SURGICAL PATHOLOGY

## 2016-04-21 MED ORDER — OXYCODONE HCL 10 MG PO TABS: 10.0000 mg | ORAL_TABLET | ORAL | 0 refills | Status: DC | PRN

## 2016-04-21 NOTE — Final Progress Note (Signed)
Patient with no complaints, feels okay to go home at this time Pain level is significantly improved AVSS Tolerating PO well Abdomen is soft, non-tender, port sites are clean Okay to discharge home

## 2016-04-21 NOTE — Progress Notes (Signed)
Patient discharged to home. IV site removed. Prescription given to patient. Discharge summary given. Concerns addressed.

## 2016-04-21 NOTE — Anesthesia Postprocedure Evaluation (Signed)
Anesthesia Post Note  Patient: Norm Salt  Procedure(s) Performed: Procedure(s) (LRB): LAPAROSCOPIC CHOLECYSTECTOMY WITH INTRAOPERATIVE CHOLANGIOGRAM (N/A)  Patient location during evaluation: PACU Anesthesia Type: General Level of consciousness: awake and alert and oriented Pain management: pain level controlled Vital Signs Assessment: post-procedure vital signs reviewed and stable Respiratory status: spontaneous breathing Cardiovascular status: blood pressure returned to baseline Anesthetic complications: no    Last Vitals:  Vitals:   04/21/16 0453 04/21/16 1213  BP: 117/89 (!) 132/100  Pulse: 86 79  Resp: 18 16  Temp: 36.8 C 36.9 C    Last Pain:  Vitals:   04/21/16 1213  TempSrc: Oral  PainSc:                  Shastina Rua

## 2016-04-22 ENCOUNTER — Other Ambulatory Visit: Payer: Self-pay | Admitting: *Deleted

## 2016-04-22 ENCOUNTER — Other Ambulatory Visit: Payer: Self-pay

## 2016-04-22 DIAGNOSIS — C50919 Malignant neoplasm of unspecified site of unspecified female breast: Secondary | ICD-10-CM

## 2016-04-22 DIAGNOSIS — C773 Secondary and unspecified malignant neoplasm of axilla and upper limb lymph nodes: Principal | ICD-10-CM

## 2016-04-22 MED ORDER — LETROZOLE 2.5 MG PO TABS: 2.5000 mg | ORAL_TABLET | Freq: Every day | ORAL | 3 refills | Status: DC

## 2016-04-22 NOTE — Addendum Note (Signed)
Addended by: Betti Cruz on: 04/22/2016 09:46 AM   Modules accepted: Orders

## 2016-04-27 ENCOUNTER — Telehealth: Payer: Self-pay | Admitting: *Deleted

## 2016-04-27 NOTE — Telephone Encounter (Signed)
Pt stopped by cancer center to request refill of letrozole. Pt informed that prescription will be sent to [facility] pharmacy on [address]. Prescription has previously been escribed to pharmacy on 10/11 by triage. Informed pt to callback if has any further questions.

## 2016-04-28 ENCOUNTER — Encounter: Payer: Self-pay | Admitting: Surgery

## 2016-04-28 ENCOUNTER — Ambulatory Visit (INDEPENDENT_AMBULATORY_CARE_PROVIDER_SITE_OTHER): Payer: Commercial Managed Care - HMO | Admitting: Surgery

## 2016-04-28 VITALS — BP 151/97 | HR 84 | Temp 98.4°F | Ht 63.0 in | Wt 186.0 lb

## 2016-04-28 DIAGNOSIS — Z09 Encounter for follow-up examination after completed treatment for conditions other than malignant neoplasm: Secondary | ICD-10-CM

## 2016-04-28 NOTE — Progress Notes (Signed)
S/p lap chole by Dr. Azalee Course 10/8 Doing well  Mild soreness at incisions sites Taking PO, no fevers or chills  PE NAD Abd: soft, NT. Incisions c/d/i  A/P Doing well Path d/w pt No heavy lifting  RTC prn

## 2016-04-28 NOTE — Patient Instructions (Signed)
Please call with any questions or concerns.

## 2016-05-14 NOTE — Discharge Summary (Signed)
Physician Discharge Summary  Patient ID: Carrie Alvarez MRN: [id number] DOB/AGE: 17-Nov-1950 65 y.o.  Admit date: 04/18/2016 Discharge date: 04/21/2016  Admission Diagnoses: Acute cholecystitis   Discharge Diagnoses:  Active Problems:   Cholangitis   Acute cholecystitis   Transaminitis   Discharged Condition: good  Hospital Course: 65 yr old with acute cholecystitis.  SHe had lap chole with IOC.  She did well in the hosptiatl   Consults: internal medicine  Significant Diagnostic Studies: CT scan, [place], MRCP  Treatments: Surgery  Discharge Exam: Blood pressure (!) 132/100, pulse 79, temperature 98.5 F (36.9 C), temperature source Oral, resp. rate 16, height 5\' 3"  (1.6 m), weight 186 lb (84.4 kg), SpO2 100 %. Please see Dr. Angie Fava note from 10/10 on day of discharge pasted below:   Patient with no complaints, feels okay to go home at this time Pain level is significantly improved AVSS Tolerating PO well Abdomen is soft, non-tender, port sites are clean Okay to discharge home  Disposition: 01-Home or Self Care  Discharge Instructions    Call MD for:  persistant nausea and vomiting    Complete by:  As directed    Call MD for:  redness, tenderness, or signs of infection (pain, swelling, redness, odor or green/yellow discharge around incision site)    Complete by:  As directed    Call MD for:  severe uncontrolled pain    Complete by:  As directed    Call MD for:  temperature >100.4    Complete by:  As directed    Diet - low sodium heart healthy    Complete by:  As directed    Discharge instructions    Complete by:  As directed    May shower. No exertional activity   Increase activity slowly    Complete by:  As directed        Medication List    STOP taking these medications   amLODipine 2.5 MG tablet Commonly known as:  NORVASC   cefUROXime 250 MG tablet Commonly known as:  CEFTIN   cyclobenzaprine 10 MG tablet Commonly known as:  FLEXERIL    gabapentin 300 MG capsule Commonly known as:  NEURONTIN   loratadine 10 MG tablet Commonly known as:  CLARITIN   meloxicam 15 MG tablet Commonly known as:  MOBIC   omeprazole 20 MG capsule Commonly known as:  PRILOSEC   pyridOXINE 100 MG tablet Commonly known as:  VITAMIN B-6   traMADol 50 MG tablet Commonly known as:  ULTRAM     TAKE these medications   cholecalciferol 1000 units tablet Commonly known as:  VITAMIN D Take 1,000 Units by mouth daily.   fluticasone 50 MCG/ACT nasal spray Commonly known as:  FLONASE Place 1 spray into both nostrils as needed for allergies or rhinitis.   Oxycodone HCl 10 MG Tabs Take 1 tablet (10 mg total) by mouth every 3 (three) hours as needed for severe pain or breakthrough pain.   sertraline 50 MG tablet Commonly known as:  ZOLOFT TAKE FOUR TABLETS BY MOUTH AT BEDTIME   vitamin B-12 500 MCG tablet Commonly known as:  CYANOCOBALAMIN Take 500 mcg by mouth daily.      Follow-up Information    Hubbard Robinson, MD. Go on 04/28/2016.   Specialty:  Surgery Why:  @9 :30am Contact information: [address] [place] [address] [phone]           Signed: Hubbard Robinson 05/14/2016, 1:54 PM

## 2016-06-17 ENCOUNTER — Telehealth: Payer: Self-pay | Admitting: *Deleted

## 2016-06-17 DIAGNOSIS — C773 Secondary and unspecified malignant neoplasm of axilla and upper limb lymph nodes: Principal | ICD-10-CM

## 2016-06-17 DIAGNOSIS — C50919 Malignant neoplasm of unspecified site of unspecified female breast: Secondary | ICD-10-CM

## 2016-06-17 MED ORDER — LETROZOLE 2.5 MG PO TABS
2.5000 mg | ORAL_TABLET | Freq: Every day | ORAL | 0 refills | Status: DC
Start: 2016-06-17 — End: 2016-09-01

## 2016-06-17 NOTE — Telephone Encounter (Signed)
This med is not due until January 11 and she has an appt on 12/19. Do you want to  Refill now or wait until her appt 12/19?

## 2016-06-17 NOTE — Telephone Encounter (Signed)
Go ahead and refill.  Thanks!

## 2016-06-18 ENCOUNTER — Other Ambulatory Visit: Payer: Self-pay

## 2016-06-18 DIAGNOSIS — F32A Depression, unspecified: Secondary | ICD-10-CM

## 2016-06-18 DIAGNOSIS — F329 Major depressive disorder, single episode, unspecified: Secondary | ICD-10-CM

## 2016-06-18 MED ORDER — SERTRALINE HCL 50 MG PO TABS: ORAL_TABLET | ORAL | 1 refills | Status: DC

## 2016-06-18 MED ORDER — MELOXICAM 15 MG PO TABS: 15.0000 mg | ORAL_TABLET | Freq: Every day | ORAL | 1 refills | Status: DC

## 2016-06-18 NOTE — Telephone Encounter (Signed)
Mail order pharmacy requesting refills on medications. Called patient to verify amlodipine dose as it is different on the request that was received.

## 2016-06-22 ENCOUNTER — Other Ambulatory Visit: Payer: Self-pay

## 2016-06-22 DIAGNOSIS — I10 Essential (primary) hypertension: Secondary | ICD-10-CM

## 2016-06-22 MED ORDER — AMLODIPINE BESYLATE 2.5 MG PO TABS: 2.5000 mg | ORAL_TABLET | Freq: Every day | ORAL | 1 refills | Status: DC

## 2016-06-23 ENCOUNTER — Ambulatory Visit: Payer: Commercial Managed Care - HMO

## 2016-06-23 ENCOUNTER — Other Ambulatory Visit: Payer: Medicare Other

## 2016-06-23 ENCOUNTER — Other Ambulatory Visit: Payer: Self-pay | Admitting: Oncology

## 2016-06-23 ENCOUNTER — Ambulatory Visit
Admission: RE | Admit: 2016-06-23 | Discharge: 2016-06-23 | Disposition: A | Discharge status: Home / Self Care | Payer: Commercial Managed Care - HMO | Source: Ambulatory Visit | Attending: Oncology | Admitting: Oncology

## 2016-06-23 DIAGNOSIS — Z853 Personal history of malignant neoplasm of breast: Secondary | ICD-10-CM

## 2016-06-23 DIAGNOSIS — Z1231 Encounter for screening mammogram for malignant neoplasm of breast: Secondary | ICD-10-CM | POA: Diagnosis not present

## 2016-06-24 ENCOUNTER — Ambulatory Visit: Payer: Medicare Other | Admitting: Oncology

## 2016-06-29 NOTE — Progress Notes (Signed)
[place]  [phone] [phone]) [phone]  ID: Norm Salt OB: 1950-10-03  MR#: [id number]  QVZ#:[id number]  Patient Care Team: Mar Daring, PA-C as PCP - General (Family Medicine) [name], MD as Consulting Physician (Oncology) Robert Bellow, MD (General Surgery) Mayra Reel, NP as Nurse Practitioner (Oncology)  CHIEF COMPLAINT: Stage IIa ER+ left breast cancer with no breast lesion and axillary lymph node metastasis.  INTERVAL HISTORY: Patient returns to clinic today for routine evaluation. She currently feels well and is back to her baseline. She no longer appears depressed. Her chronic pain has significantly improved. She continues to tolerate letrozole without significant side effects. She continues to have a mild peripheral neuropathy, but has no other neurologic complaints. She denies any recent fevers. She has no chest pain or shortness of breath. She denies any nausea, vomiting, constipation, or diarrhea.  She has no urinary complaints. Patient offers no further specific complaints today.    REVIEW OF SYSTEMS:   Review of Systems  Constitutional: Negative for fever, malaise/fatigue and weight loss.  Respiratory: Negative.  Negative for shortness of breath.   Cardiovascular: Negative.  Negative for chest pain and leg swelling.  Gastrointestinal: Negative.   Neurological: Positive for sensory change. Negative for dizziness, weakness and headaches.  Psychiatric/Behavioral: Negative for depression. The patient is not nervous/anxious.     As per HPI. Otherwise, a complete review of systems is negative.  PAST MEDICAL HISTORY: Past Medical History:  Diagnosis Date  . Allergy   . Anemia   . Back pain   . Breast cancer ([place]) 2015   LT LUMPECTOMY 12-2014 FOLLOWING CHEMO  . Carpal tunnel syndrome   . Depression   . GERD (gastroesophageal reflux disease)   . Hypertension   . Obesity   . Uterine cancer ([place]) 1994     PAST SURGICAL HISTORY: Past Surgical History:  Procedure Laterality Date  . ABDOMINAL HYSTERECTOMY  1994   UTERINE CA  . AXILLARY LYMPH NODE BIOPSY Left 12/18/2014   Procedure: AXILLARY LYMPH NODE BIOPSY/;  Surgeon: Robert Bellow, MD;  Location: [facility] ORS;  Service: General;  Laterality: Left;  . AXILLARY LYMPH NODE DISSECTION Left 12/18/2014   Procedure: AXILLARY LYMPH NODE DISSECTION;  Surgeon: Robert Bellow, MD;  Location: [facility] ORS;  Service: General;  Laterality: Left;  . BREAST BIOPSY Left 05/2014   CORE - POS  . BREAST SURGERY    . CHOLECYSTECTOMY N/A 04/19/2016   Procedure: LAPAROSCOPIC CHOLECYSTECTOMY WITH INTRAOPERATIVE CHOLANGIOGRAM;  Surgeon: Hubbard Robinson, MD;  Location: [facility] ORS;  Service: General;  Laterality: N/A;  . medial branch block  11/06/2015   lumbar facet Dr. Primus Bravo  . SENTINEL NODE BIOPSY Left 12/18/2014   Procedure: SENTINEL NODE BIOPSY;  Surgeon: Robert Bellow, MD;  Location: [facility] ORS;  Service: General;  Laterality: Left;    FAMILY HISTORY Family History  Problem Relation Age of Onset  . Breast cancer Sister   . Colon cancer Mother   . Hypertension Mother   . Asthma Mother   . Cancer Father        ADVANCED DIRECTIVES:    HEALTH MAINTENANCE: Social History  Substance Use Topics  . Smoking status: Former Smoker    Packs/day: 0.50    Types: Cigarettes    Quit date: 07/18/2014  . Smokeless tobacco: Never Used     Comment: given smoking and pain information   . Alcohol use No    No Known Allergies  Current Outpatient Prescriptions  Medication Sig Dispense Refill  . acetaminophen (TYLENOL) 500 MG tablet Take by mouth.    Marland Kitchen amLODipine (NORVASC) 2.5 MG tablet Take 1 tablet (2.5 mg total) by mouth daily. 90 tablet 1  . cholecalciferol (VITAMIN D) 1000 units tablet Take 1,000 Units by mouth daily.    . cyclobenzaprine (FLEXERIL) 5 MG tablet Take by mouth.    . fluticasone (FLONASE) 50 MCG/ACT nasal spray Place 1 spray into both  nostrils as needed for allergies or rhinitis.    Marland Kitchen gabapentin (NEURONTIN) 300 MG capsule Take 300 mg by mouth 4 (four) times daily as needed.    Marland Kitchen ibuprofen (ADVIL,MOTRIN) 200 MG tablet Take by mouth.    . letrozole (FEMARA) 2.5 MG tablet Take 1 tablet (2.5 mg total) by mouth daily. 90 tablet 0  . meloxicam (MOBIC) 15 MG tablet Take 1 tablet (15 mg total) by mouth daily. 90 tablet 1  . omeprazole (PRILOSEC) 20 MG capsule Take 20 mg by mouth 2 (two) times daily before a meal.    . oxyCODONE 10 MG TABS Take 1 tablet (10 mg total) by mouth every 3 (three) hours as needed for severe pain or breakthrough pain. 30 tablet 0  . pyridoxine (B-6) 100 MG tablet Take 100 mg by mouth daily.    . sertraline (ZOLOFT) 50 MG tablet TAKE FOUR TABLETS BY MOUTH AT BEDTIME 360 tablet 1  . vitamin B-12 (CYANOCOBALAMIN) 500 MCG tablet Take 500 mcg by mouth daily.    . cyclobenzaprine (FLEXERIL) 10 MG tablet      No current facility-administered medications for this visit.    Facility-Administered Medications Ordered in Other Visits  Medication Dose Route Frequency Provider Last Rate Last Dose  . heparin lock flush 100 unit/mL  500 Units Intravenous Once [name], MD      . sodium chloride 0.9 % injection 10 mL  10 mL Intravenous Once [name], MD      . sodium chloride flush (NS) 0.9 % injection 10 mL  10 mL Intravenous PRN [name], MD   10 mL at 12/24/15 1444    OBJECTIVE: Vitals:   06/30/16 1445  BP: (!) 131/95  Pulse: 98  Resp: 18  Temp: 98.5 F (36.9 C)     Body mass index is 34.64 kg/m.    ECOG FS:1 - Symptomatic but completely ambulatory  General: Well-developed, well-nourished, no acute distress. Eyes: anicteric sclera. Breasts: Patient refused exam today. Lungs: Clear to auscultation bilaterally. Heart: Regular rate and rhythm. No rubs, murmurs, or gallops. Abdomen: Soft, nontender, nondistended.  Musculoskeletal: No edema, cyanosis, or clubbing. Neuro: Alert,  answering all questions appropriately. Cranial nerves grossly intact. Skin: No rashes or petechiae noted. Psych: Normal affect.    LAB RESULTS:  Lab Results  Component Value Date   NA 137 04/20/2016   K 3.8 04/20/2016   CL 105 04/20/2016   CO2 22 04/20/2016   GLUCOSE 91 04/20/2016   BUN 12 04/20/2016   CREATININE 1.09 (H) 04/20/2016   CALCIUM 9.8 04/20/2016   PROT 7.4 04/20/2016   ALBUMIN 3.8 04/20/2016   AST 148 (H) 04/20/2016   ALT 210 (H) 04/20/2016   ALKPHOS 226 (H) 04/20/2016   BILITOT 1.1 04/20/2016   GFRNONAA 52 (L) 04/20/2016   GFRAA >60 04/20/2016    Lab Results  Component Value Date   WBC 11.2 (H) 04/20/2016   NEUTROABS 8.7 (H) 04/18/2016   HGB 12.7 04/20/2016   HCT 37.1 04/20/2016   MCV 82.0 04/20/2016  PLT 180 04/20/2016     STUDIES: Mm Screening Breast Tomo Bilateral  Result Date: 06/23/2016 CLINICAL DATA:  Screening. EXAM: 2D DIGITAL SCREENING BILATERAL MAMMOGRAM WITH CAD AND ADJUNCT TOMO COMPARISON:  Previous exam(s). ACR Breast Density Category c: The breast tissue is heterogeneously dense, which may obscure small masses. FINDINGS: There are no findings suspicious for malignancy. Images were processed with CAD. IMPRESSION: No mammographic evidence of malignancy. A result letter of this screening mammogram will be mailed directly to the patient. RECOMMENDATION: Screening mammogram in one year. (Code:SM-B-01Y) BI-RADS CATEGORY  1: Negative. Electronically Signed   By: Dorise Bullion III M.D   On: 06/23/2016 11:47    ASSESSMENT: Stage IIa ER+ left breast cancer with no breast lesion and axillary lymph node metastasis. (TxN1M0)  HER-2 negative.   PLAN:   1. Stage IIa ER+ left breast cancer with no breast lesion and axillary lymph node metastasis: Despite no obvious breast lesion on mammogram or breast MRI, pathology and pattern of spread was consistent with primary breast cancer. CT and bone scan with no other evidence of malignancy. Previously because  of difficulty with treatment, patient only received 3 cycles of Adriamycin and Cytoxan. She did not complete 12 cycles of weekly Taxol. It was elected not to pursue axillary node dissection given the potential morbidity of this procedure. It was also elected not to pursue adjuvant XRT given there was no primary breast lesion. Continue with letrozole which patient will require to take for 5 years completing in July 2021. Baseline bone mineral density on January 24, 2015 with a T score of -0.7, which is considered normal. Repeat in July 2018. Her most recent mammogram on June 23, 2016 was reported as BI-RADS 1, repeat in December 2018. Return to clinic in 6 months for routine evaluation. 2. Family history: Patient's sister reports she has positive for Lynch syndrome and we are attempting to confirm these results. Patient may also benefit from genetic testing herself in the near future.  Patient had a colonoscopy in October 2012 that was reported as normal. 3. Hypertension: Treatment as per primary care.  4. Pain/Peripheral neuropathy: Continue evaluation and treatment per Pain Clinic   Patient expressed understanding and was in agreement with this plan. She also understands that She can call clinic at any time with any questions, concerns, or complaints.   Breast cancer metastasized to axillary lymph node   Staging form: Breast, AJCC 7th Edition     Clinical stage from 10/22/2014: Stage Unknown (TX, N1, M0) - Signed by [name], MD on 10/22/2014   [name], MD   07/04/2016 8:29 AM

## 2016-06-30 ENCOUNTER — Inpatient Hospital Stay: Payer: Commercial Managed Care - HMO | Attending: Oncology | Admitting: Oncology

## 2016-06-30 VITALS — BP 131/95 | HR 98 | Temp 98.5°F | Resp 18 | Wt 195.5 lb

## 2016-06-30 DIAGNOSIS — K219 Gastro-esophageal reflux disease without esophagitis: Secondary | ICD-10-CM

## 2016-06-30 DIAGNOSIS — C773 Secondary and unspecified malignant neoplasm of axilla and upper limb lymph nodes: Secondary | ICD-10-CM

## 2016-06-30 DIAGNOSIS — G8929 Other chronic pain: Secondary | ICD-10-CM | POA: Insufficient documentation

## 2016-06-30 DIAGNOSIS — G629 Polyneuropathy, unspecified: Secondary | ICD-10-CM | POA: Diagnosis not present

## 2016-06-30 DIAGNOSIS — Z87891 Personal history of nicotine dependence: Secondary | ICD-10-CM

## 2016-06-30 DIAGNOSIS — Z170 Estrogen receptor positive status [ER+]: Secondary | ICD-10-CM | POA: Insufficient documentation

## 2016-06-30 DIAGNOSIS — C50912 Malignant neoplasm of unspecified site of left female breast: Secondary | ICD-10-CM | POA: Diagnosis not present

## 2016-06-30 DIAGNOSIS — Z9221 Personal history of antineoplastic chemotherapy: Secondary | ICD-10-CM | POA: Insufficient documentation

## 2016-06-30 DIAGNOSIS — I10 Essential (primary) hypertension: Secondary | ICD-10-CM | POA: Insufficient documentation

## 2016-06-30 DIAGNOSIS — Z79811 Long term (current) use of aromatase inhibitors: Secondary | ICD-10-CM | POA: Diagnosis not present

## 2016-06-30 DIAGNOSIS — Z90710 Acquired absence of both cervix and uterus: Secondary | ICD-10-CM

## 2016-06-30 DIAGNOSIS — Z79899 Other long term (current) drug therapy: Secondary | ICD-10-CM | POA: Insufficient documentation

## 2016-06-30 DIAGNOSIS — Z800 Family history of malignant neoplasm of digestive organs: Secondary | ICD-10-CM | POA: Diagnosis not present

## 2016-06-30 DIAGNOSIS — Z803 Family history of malignant neoplasm of breast: Secondary | ICD-10-CM | POA: Insufficient documentation

## 2016-06-30 DIAGNOSIS — Z8542 Personal history of malignant neoplasm of other parts of uterus: Secondary | ICD-10-CM

## 2016-06-30 NOTE — Progress Notes (Signed)
Here for follow up. C/o pain where port removed June/17

## 2016-08-18 ENCOUNTER — Other Ambulatory Visit: Payer: Self-pay | Admitting: Pain Medicine

## 2016-09-01 ENCOUNTER — Other Ambulatory Visit: Payer: Self-pay | Admitting: Oncology

## 2016-09-01 DIAGNOSIS — C773 Secondary and unspecified malignant neoplasm of axilla and upper limb lymph nodes: Principal | ICD-10-CM

## 2016-09-01 DIAGNOSIS — C50919 Malignant neoplasm of unspecified site of unspecified female breast: Secondary | ICD-10-CM

## 2016-09-05 ENCOUNTER — Ambulatory Visit (INDEPENDENT_AMBULATORY_CARE_PROVIDER_SITE_OTHER): Payer: Medicare PPO | Admitting: Family Medicine

## 2016-09-05 ENCOUNTER — Encounter: Payer: Self-pay | Admitting: Family Medicine

## 2016-09-05 VITALS — BP 122/70 | HR 86 | Temp 98.8°F | Resp 16 | Wt 194.0 lb

## 2016-09-05 DIAGNOSIS — C773 Secondary and unspecified malignant neoplasm of axilla and upper limb lymph nodes: Secondary | ICD-10-CM

## 2016-09-05 DIAGNOSIS — R609 Edema, unspecified: Secondary | ICD-10-CM | POA: Diagnosis not present

## 2016-09-05 DIAGNOSIS — I10 Essential (primary) hypertension: Secondary | ICD-10-CM

## 2016-09-05 DIAGNOSIS — C50912 Malignant neoplasm of unspecified site of left female breast: Secondary | ICD-10-CM

## 2016-09-05 MED ORDER — FUROSEMIDE 20 MG PO TABS: 20.0000 mg | ORAL_TABLET | Freq: Every day | ORAL | 3 refills | Status: DC

## 2016-09-05 NOTE — Patient Instructions (Signed)
Edema Edema is an abnormal buildup of fluids in your bodytissues. Edema is somewhatdependent on gravity to pull the fluid to the lowest place in your body. That makes the condition more common in the legs and thighs (lower extremities). Painless swelling of the feet and ankles is common and becomes more likely as you get older. It is also common in looser tissues, like around your eyes. When the affected area is squeezed, the fluid may move out of that spot and leave a dent for a few moments. This dent is called pitting. What are the causes? There are many possible causes of edema. Eating too much salt and being on your feet or sitting for a long time can cause edema in your legs and ankles. Hot weather may make edema worse. Common medical causes of edema include:  Heart failure.  Liver disease.  Kidney disease.  Weak blood vessels in your legs.  Cancer.  An injury.  Pregnancy.  Some medications.  Obesity.  What are the signs or symptoms? Edema is usually painless.Your skin may look swollen or shiny. How is this diagnosed? Your health care provider may be able to diagnose edema by asking about your medical history and doing a physical exam. You may need to have tests such as X-rays, an electrocardiogram, or blood tests to check for medical conditions that may cause edema. How is this treated? Edema treatment depends on the cause. If you have heart, liver, or kidney disease, you need the treatment appropriate for these conditions. General treatment may include:  Elevation of the affected body part above the level of your heart.  Compression of the affected body part. Pressure from elastic bandages or support stockings squeezes the tissues and forces fluid back into the blood vessels. This keeps fluid from entering the tissues.  Restriction of fluid and salt intake.  Use of a water pill (diuretic). These medications are appropriate only for some types of edema. They pull fluid  out of your body and make you urinate more often. This gets rid of fluid and reduces swelling, but diuretics can have side effects. Only use diuretics as directed by your health care provider.  Follow these instructions at home:  Keep the affected body part above the level of your heart when you are lying down.  Do not sit still or stand for prolonged periods.  Do not put anything directly under your knees when lying down.  Do not wear constricting clothing or garters on your upper legs.  Exercise your legs to work the fluid back into your blood vessels. This may help the swelling go down.  Wear elastic bandages or support stockings to reduce ankle swelling as directed by your health care provider.  Eat a low-salt diet to reduce fluid if your health care provider recommends it.  Only take medicines as directed by your health care provider. Contact a health care provider if:  Your edema is not responding to treatment.  You have heart, liver, or kidney disease and notice symptoms of edema.  You have edema in your legs that does not improve after elevating them.  You have sudden and unexplained weight gain. Get help right away if:  You develop shortness of breath or chest pain.  You cannot breathe when you lie down.  You develop pain, redness, or warmth in the swollen areas.  You have heart, liver, or kidney disease and suddenly get edema.  You have a fever and your symptoms suddenly get worse. This information is   not intended to replace advice given to you by your health care provider. Make sure you discuss any questions you have with your health care provider. Document Released: 06/29/2005 Document Revised: 12/05/2015 Document Reviewed: 04/21/2013 Elsevier Interactive Patient Education  2017 Elsevier Inc.  

## 2016-09-05 NOTE — Progress Notes (Signed)
Patient: Carrie Alvarez Female    DOB: [birth year]   66 y.o.   MRN: [id number] Visit Date: 09/05/2016  Today's Provider: Vernie Murders, PA   Chief Complaint  Patient presents with  . Edema   Subjective:    HPI Patient comes in today c/o swelling in both legs and feet. Patient reports that she noticed the swelling since yesterday. Patient also mentions that her legs are painful.   Past Medical History:  Diagnosis Date  . Allergy   . Anemia   . Back pain   . Breast cancer ([place]) 2015   LT LUMPECTOMY 12-2014 FOLLOWING CHEMO  . Carpal tunnel syndrome   . Depression   . GERD (gastroesophageal reflux disease)   . Hypertension   . Obesity   . Uterine cancer ([place]) 1994   Past Surgical History:  Procedure Laterality Date  . ABDOMINAL HYSTERECTOMY  1994   UTERINE CA  . AXILLARY LYMPH NODE BIOPSY Left 12/18/2014   Procedure: AXILLARY LYMPH NODE BIOPSY/;  Surgeon: Robert Bellow, MD;  Location: [facility];  Service: General;  Laterality: Left;  . AXILLARY LYMPH NODE DISSECTION Left 12/18/2014   Procedure: AXILLARY LYMPH NODE DISSECTION;  Surgeon: Robert Bellow, MD;  Location: [facility];  Service: General;  Laterality: Left;  . BREAST BIOPSY Left 05/2014   CORE - POS  . BREAST SURGERY    . CHOLECYSTECTOMY N/A 04/19/2016   Procedure: LAPAROSCOPIC CHOLECYSTECTOMY WITH INTRAOPERATIVE CHOLANGIOGRAM;  Surgeon: Hubbard Robinson, MD;  Location: [facility] OR[facility];  Service: General;  Laterality: N/A;  . medial branch block  11/06/2015   lumbar facet Dr. Primus Bravo  . SENTINEL NODE BIOPSY Left 12/18/2014   Procedure: SENTINEL NODE BIOPSY;  Surgeon: Robert Bellow, MD;  Location: [facility] ORS;  Service: General;  Laterality: Left;     Family History  Problem Relation Age of Onset  . Breast cancer Sister   . Colon cancer Mother   . Hypertension Mother   . Asthma Mother   . Cancer Father     No Known Allergies   Current Outpatient Prescriptions:  .  acetaminophen (TYLENOL) 500 MG  tablet, Take by mouth., Disp: , Rfl:  .  amLODipine (NORVASC) 2.5 MG tablet, Take 1 tablet (2.5 mg total) by mouth daily., Disp: 90 tablet, Rfl: 1 .  cholecalciferol (VITAMIN D) 1000 units tablet, Take 1,000 Units by mouth daily., Disp: , Rfl:  .  cyclobenzaprine (FLEXERIL) 10 MG tablet, , Disp: , Rfl:  .  cyclobenzaprine (FLEXERIL) 5 MG tablet, Take by mouth., Disp: , Rfl:  .  fluticasone (FLONASE) 50 MCG/ACT nasal spray, Place 1 spray into both nostrils as needed for allergies or rhinitis., Disp: , Rfl:  .  gabapentin (NEURONTIN) 300 MG capsule, Take 300 mg by mouth 4 (four) times daily as needed., Disp: , Rfl:  .  ibuprofen (ADVIL,MOTRIN) 200 MG tablet, Take by mouth., Disp: , Rfl:  .  letrozole (FEMARA) 2.5 MG tablet, TAKE 1 TABLET EVERY DAY, Disp: 90 tablet, Rfl: 0 .  meloxicam (MOBIC) 15 MG tablet, Take 1 tablet (15 mg total) by mouth daily., Disp: 90 tablet, Rfl: 1 .  omeprazole (PRILOSEC) 20 MG capsule, Take 20 mg by mouth 2 (two) times daily before a meal., Disp: , Rfl:  .  oxyCODONE 10 MG TABS, Take 1 tablet (10 mg total) by mouth every 3 (three) hours as needed for severe pain or breakthrough pain., Disp: 30 tablet, Rfl: 0 .  pyridoxine (B-6) 100  MG tablet, Take 100 mg by mouth daily., Disp: , Rfl:  .  sertraline (ZOLOFT) 50 MG tablet, TAKE FOUR TABLETS BY MOUTH AT BEDTIME, Disp: 360 tablet, Rfl: 1 .  vitamin B-12 (CYANOCOBALAMIN) 500 MCG tablet, Take 500 mcg by mouth daily., Disp: , Rfl:  No current facility-administered medications for this visit.   Facility-Administered Medications Ordered in Other Visits:  .  heparin lock flush 100 unit/mL, 500 Units, Intravenous, Once, [name], MD .  sodium chloride 0.9 % injection 10 mL, 10 mL, Intravenous, Once, [name], MD .  sodium chloride flush (NS) 0.9 % injection 10 mL, 10 mL, Intravenous, PRN, [name], MD, 10 mL at 12/24/15 1444  Review of Systems  Constitutional: Positive for activity change and  fatigue.  Respiratory: Negative for apnea, cough, choking, chest tightness, shortness of breath, wheezing and stridor.   Cardiovascular: Positive for leg swelling. Negative for chest pain and palpitations.  Neurological: Positive for numbness.    Social History  Substance Use Topics  . Smoking status: Former Smoker    Packs/day: 0.50    Types: Cigarettes    Quit date: 07/18/2014  . Smokeless tobacco: Never Used     Comment: given smoking and pain information   . Alcohol use No   Objective:   BP 122/70 (BP Location: Right Arm, Patient Position: Sitting, Cuff Size: Normal)   Pulse 86   Temp 98.8 F (37.1 C)   Resp 16   Wt 194 lb (88 kg)   SpO2 98%   BMI 34.37 kg/m  Wt Readings from Last 3 Encounters:  09/05/16 194 lb (88 kg)  06/30/16 195 lb 8.8 oz (88.7 kg)  04/28/16 186 lb (84.4 kg)    Physical Exam  Constitutional: She is oriented to person, place, and time. She appears well-developed and well-nourished. No distress.  HENT:  Head: Normocephalic and atraumatic.  Right Ear: Hearing normal.  Left Ear: Hearing normal.  Nose: Nose normal.  Eyes: Conjunctivae and lids are normal. Right eye exhibits no discharge. Left eye exhibits no discharge. No scleral icterus.  Pulmonary/Chest: Effort normal. No respiratory distress.  Musculoskeletal: She exhibits edema.  1+ edema of each ankle. No pitting or redness. Good pulses with some tenderness..   Neurological: She is alert and oriented to person, place, and time.  Skin: Skin is intact. No lesion and no rash noted.  Psychiatric: She has a normal mood and affect. Her speech is normal and behavior is normal. Thought content normal.      Assessment & Plan:     1. Peripheral edema Onset yesterday. Denies recent use of excess salt/sodium in diet. No previous problems with edema except in the left arm after surgery for left breast cancer and axillary lymph node metastases (suspect lymphedema left arm). Both ankles have 1+ edema and  aching. Will give diuretic and encourage to elevate feet as often as possible. Recheck if no better in a week. - furosemide (LASIX) 20 MG tablet; Take 1 tablet (20 mg total) by mouth daily.  Dispense: 30 tablet; Refill: 3  2. Essential hypertension Stable and well controlled. Still taking Amlodipine 2.5 mg qd. Denies having any peripheral edema side effect from Amlodipine in the past several years.   3. Carcinoma of left breast metastatic to axillary lymph node ([place]) Diagnosed 3 years ago. Still followed by Dr. Grayland Ormond (oncologist) and taking Letrozole 2.5 mg daily. Has had peripheral neuropathy side effect from chemotherapy and taking Neurontin 300 mg QID. Followed by  Dr. Primus Bravo (pain management) for chronic back pain.       Vernie Murders, PA  [facility]

## 2016-09-22 ENCOUNTER — Ambulatory Visit (INDEPENDENT_AMBULATORY_CARE_PROVIDER_SITE_OTHER): Payer: Medicare PPO

## 2016-09-22 ENCOUNTER — Encounter: Payer: Self-pay | Admitting: Physician Assistant

## 2016-09-22 ENCOUNTER — Ambulatory Visit (INDEPENDENT_AMBULATORY_CARE_PROVIDER_SITE_OTHER): Payer: Medicare PPO | Admitting: Physician Assistant

## 2016-09-22 VITALS — BP 118/62 | HR 88 | Temp 98.1°F | Ht 63.0 in | Wt 192.4 lb

## 2016-09-22 VITALS — BP 118/62 | HR 88 | Temp 98.1°F | Resp 16 | Ht 63.0 in | Wt 192.4 lb

## 2016-09-22 DIAGNOSIS — C50912 Malignant neoplasm of unspecified site of left female breast: Secondary | ICD-10-CM

## 2016-09-22 DIAGNOSIS — F32A Depression, unspecified: Secondary | ICD-10-CM

## 2016-09-22 DIAGNOSIS — Z0000 Encounter for general adult medical examination without abnormal findings: Secondary | ICD-10-CM | POA: Diagnosis not present

## 2016-09-22 DIAGNOSIS — I10 Essential (primary) hypertension: Secondary | ICD-10-CM | POA: Diagnosis not present

## 2016-09-22 DIAGNOSIS — Z23 Encounter for immunization: Secondary | ICD-10-CM

## 2016-09-22 DIAGNOSIS — F329 Major depressive disorder, single episode, unspecified: Secondary | ICD-10-CM

## 2016-09-22 DIAGNOSIS — C773 Secondary and unspecified malignant neoplasm of axilla and upper limb lymph nodes: Secondary | ICD-10-CM | POA: Diagnosis not present

## 2016-09-22 DIAGNOSIS — E7800 Pure hypercholesterolemia, unspecified: Secondary | ICD-10-CM

## 2016-09-22 MED ORDER — SERTRALINE HCL 50 MG PO TABS: ORAL_TABLET | ORAL | 3 refills | Status: DC

## 2016-09-22 NOTE — Patient Instructions (Signed)

## 2016-09-22 NOTE — Addendum Note (Signed)
Addended by: Ashley Royalty E on: 09/22/2016 01:25 PM   Modules accepted: Orders

## 2016-09-22 NOTE — Progress Notes (Signed)
Patient: Carrie Alvarez Female    DOB: October 06, 1950   66 y.o.   MRN: [id number] Visit Date: 09/22/2016  Today's Provider: Trinna Post, PA-C   Chief Complaint  Patient presents with  . Depression  . Hypertension  . Hyperlipidemia   Subjective:    Depression         This is a chronic problem.  The problem has been gradually worsening (Pt stopped Zoloft secondary to cost. ) since onset.  Associated symptoms include helplessness, hopelessness, decreased interest and sad.  Associated symptoms include no decreased concentration, no fatigue, does not have insomnia, no appetite change, no myalgias, no headaches and no suicidal ideas. Hypertension  This is a chronic problem. Condition status: BP at home run 130-140/90's.  Pt reports she has had some high reading at home secondary to pain.  Associated symptoms include blurred vision. Pertinent negatives include no chest pain, headaches, malaise/fatigue, neck pain, orthopnea, palpitations, peripheral edema, PND, shortness of breath or sweats.  Hyperlipidemia  This is a chronic problem. Recent lipid tests were reviewed and are high. Pertinent negatives include no chest pain, myalgias or shortness of breath. She is currently on no antihyperlipidemic treatment.    Patient is a 66 y/o woman with a history of Stage II A breast cancer on femara, depression, HTN, hypercholesterolemia presenting today for a follow up visit of chronic conditions.  She is followed by Dr. Grayland Ormond for her breast cancer. She tolerated on three cycles of chemotherapy before discontinuing. She has had left axillary lymph node resection. She did not have a primary breast mass, only metastatic lymph node disease. Her last mammogram in Dec 2017 was Birads 1. She is due for Dexa in 02/2017. These are arranged by Dr. Grayland Ormond.   She has discontinued her zoloft 200 mg daily >3 mo ago 2/2 insurance issues. She is feeling depressed today. No SI/HI but ongoing issues with  family including caring for her dependent mother and struggles with her siblings. Home health is evaluating her mother today for possible services, though patient anticipates she will still have to contribute a large amount of her time.  She is doing well with her blood pressure today. She is on Lasix 20 mg 2/2 edema and amlodipine 2.5 mg at night and doing well with both.  She is due for cholesterol testing today. She is also due for Prevnar 13 today. BP Readings from Last 3 Encounters:  09/22/16 118/62  09/22/16 118/62  09/05/16 122/70   Wt Readings from Last 3 Encounters:  09/22/16 192 lb 6.4 oz (87.3 kg)  09/22/16 192 lb 6.4 oz (87.3 kg)  09/05/16 194 lb (88 kg)   Lab Results  Component Value Date   CHOL 221 (H) 09/27/2015   Lab Results  Component Value Date   HDL 69 09/27/2015   Lab Results  Component Value Date   LDLCALC 139 (H) 09/27/2015   Lab Results  Component Value Date   TRIG 66 09/27/2015   No results found for: CHOLHDL No results found for: LDLDIRECT     No Known Allergies   Current Outpatient Prescriptions:  .  acetaminophen (TYLENOL) 500 MG tablet, Take by mouth., Disp: , Rfl:  .  amLODipine (NORVASC) 2.5 MG tablet, Take 1 tablet (2.5 mg total) by mouth daily., Disp: 90 tablet, Rfl: 1 .  cholecalciferol (VITAMIN D) 1000 units tablet, Take 1,000 Units by mouth daily., Disp: , Rfl:  .  cyclobenzaprine (FLEXERIL) 10 MG tablet, , Disp: ,  Rfl:  .  cyclobenzaprine (FLEXERIL) 5 MG tablet, Take 5 mg by mouth daily as needed. , Disp: , Rfl:  .  fluticasone (FLONASE) 50 MCG/ACT nasal spray, Place 1 spray into both nostrils as needed for allergies or rhinitis., Disp: , Rfl:  .  furosemide (LASIX) 20 MG tablet, Take 1 tablet (20 mg total) by mouth daily., Disp: 30 tablet, Rfl: 3 .  gabapentin (NEURONTIN) 300 MG capsule, Take 300 mg by mouth daily as needed. , Disp: , Rfl:  .  ibuprofen (ADVIL,MOTRIN) 200 MG tablet, Take by mouth., Disp: , Rfl:  .  letrozole  (FEMARA) 2.5 MG tablet, TAKE 1 TABLET EVERY DAY, Disp: 90 tablet, Rfl: 0 .  meloxicam (MOBIC) 15 MG tablet, Take 1 tablet (15 mg total) by mouth daily., Disp: 90 tablet, Rfl: 1 .  omeprazole (PRILOSEC) 20 MG capsule, Take 20 mg by mouth 2 (two) times daily before a meal., Disp: , Rfl:  .  oxyCODONE 10 MG TABS, Take 1 tablet (10 mg total) by mouth every 3 (three) hours as needed for severe pain or breakthrough pain., Disp: 30 tablet, Rfl: 0 .  pyridoxine (B-6) 100 MG tablet, Take 100 mg by mouth daily., Disp: , Rfl:  .  vitamin B-12 (CYANOCOBALAMIN) 500 MCG tablet, Take 500 mcg by mouth daily., Disp: , Rfl:  .  sertraline (ZOLOFT) 50 MG tablet, Take 50 mg for two weeks, then 100mg  for two weeks, 150 for two weeks, 200 for two weeks., Disp: 30 tablet, Rfl: 3 No current facility-administered medications for this visit.   Facility-Administered Medications Ordered in Other Visits:  .  heparin lock flush 100 unit/mL, 500 Units, Intravenous, Once, [name], MD .  sodium chloride 0.9 % injection 10 mL, 10 mL, Intravenous, Once, [name], MD .  sodium chloride flush (NS) 0.9 % injection 10 mL, 10 mL, Intravenous, PRN, [name], MD, 10 mL at 12/24/15 1444  Review of Systems  Constitutional: Negative for appetite change, fatigue and malaise/fatigue.  HENT: Negative.   Eyes: Positive for blurred vision.  Respiratory: Negative for shortness of breath.   Cardiovascular: Negative.  Negative for chest pain, palpitations, orthopnea and PND.  Gastrointestinal: Negative.   Endocrine: Negative.   Genitourinary: Negative.   Musculoskeletal: Positive for back pain. Negative for arthralgias, gait problem, joint swelling, myalgias, neck pain and neck stiffness.  Skin: Negative.   Allergic/Immunologic: Negative.   Neurological: Negative.  Negative for headaches.  Hematological: Negative.   Psychiatric/Behavioral: Positive for depression. Negative for decreased concentration and  suicidal ideas. The patient does not have insomnia.     Social History  Substance Use Topics  . Smoking status: Former Smoker    Packs/day: 0.50    Types: Cigarettes    Quit date: 07/18/2014  . Smokeless tobacco: Never Used     Comment: given smoking and pain information   . Alcohol use No   Objective:   BP 118/62   Pulse 88   Temp 98.1 F (36.7 C) (Oral)   Resp 16   Ht 5\' 3"  (1.6 m)   Wt 192 lb 6.4 oz (87.3 kg)   BMI 34.08 kg/m  Vitals:   09/22/16 1007  BP: 118/62  Pulse: 88  Resp: 16  Temp: 98.1 F (36.7 C)  TempSrc: Oral  Weight: 192 lb 6.4 oz (87.3 kg)  Height: 5\' 3"  (1.6 m)     Physical Exam  Constitutional: She is oriented to person, place, and time.  Cardiovascular: Normal rate  and regular rhythm.   Pulmonary/Chest: Effort normal and breath sounds normal.  Lymphadenopathy:    She has no cervical adenopathy.  Neurological: She is alert and oriented to person, place, and time.  Skin: Skin is warm and dry.  Mild non pitting edema in left arm. No pedal edema.  Psychiatric: Her behavior is normal. She exhibits a depressed mood.        Assessment & Plan:     1. Essential hypertension  Doing well with amlodipine and Lasix. Continue regimen.   - Comprehensive metabolic panel  2. Depression, unspecified depression type  Restart zoloft at lower dose and titrate up every two weeks. Patient will call insurance to see if this is covered. Will see patient back in one month for this. Seems to have a lot of social factors contributing.  - sertraline (ZOLOFT) 50 MG tablet; Take 50 mg for two weeks, then 100mg  for two weeks, 150 for two weeks, 200 for two weeks.  Dispense: 30 tablet; Refill: 3  3. Hypercholesteremia  - Lipid panel  4. Need for pneumococcal vaccination Counseled patient on need for this, especially in light of malignancy. Counseled that it may not prevent all pneumonias, but can also lessen severity of course.  5. Carcinoma of left breast  metastatic to axillary lymph node ([place])  Followed by Dr. Grayland Ormond. Doing well today. Has some port pain, but otherwise fine.  I have spent 25 minutes with this patient, > 50% of which was spent on counseling and coordination of care.  Return in about 1 month (around 10/23/2016) for depression .        Trinna Post, PA-C  [facility]

## 2016-09-22 NOTE — Patient Instructions (Signed)

## 2016-09-22 NOTE — Progress Notes (Signed)
Subjective:   Carrie Alvarez is a 66 y.o. female who presents for Medicare Annual (Subsequent) preventive examination.  Review of Systems:  N/A  Cardiac Risk Factors include: advanced age (>68men, >67 women);hypertension     Objective:     Vitals: BP 118/62 (BP Location: Right Arm)   Pulse 88   Temp 98.1 F (36.7 C) (Oral)   Ht 5\' 3"  (1.6 m)   Wt 192 lb 6.4 oz (87.3 kg)   BMI 34.08 kg/m   Body mass index is 34.08 kg/m.   Tobacco History  Smoking Status  . Former Smoker  . Packs/day: 0.50  . Types: Cigarettes  . Quit date: 07/18/2014  Smokeless Tobacco  . Never Used    Comment: given smoking and pain information      Counseling given: Not Answered   Past Medical History:  Diagnosis Date  . Allergy   . Anemia   . Back pain   . Breast cancer ([place]) 2015   LT LUMPECTOMY 12-2014 FOLLOWING CHEMO  . Carpal tunnel syndrome   . Depression   . GERD (gastroesophageal reflux disease)   . Hypertension   . Obesity   . Uterine cancer ([place]) 1994   Past Surgical History:  Procedure Laterality Date  . ABDOMINAL HYSTERECTOMY  1994   UTERINE CA  . AXILLARY LYMPH NODE BIOPSY Left 12/18/2014   Procedure: AXILLARY LYMPH NODE BIOPSY/;  Surgeon: Robert Bellow, MD;  Location: [facility] ORS;  Service: General;  Laterality: Left;  . AXILLARY LYMPH NODE DISSECTION Left 12/18/2014   Procedure: AXILLARY LYMPH NODE DISSECTION;  Surgeon: Robert Bellow, MD;  Location: [facility] ORS;  Service: General;  Laterality: Left;  . BREAST BIOPSY Left 05/2014   CORE - POS  . BREAST SURGERY    . CHOLECYSTECTOMY N/A 04/19/2016   Procedure: LAPAROSCOPIC CHOLECYSTECTOMY WITH INTRAOPERATIVE CHOLANGIOGRAM;  Surgeon: Hubbard Robinson, MD;  Location: [facility] ORS;  Service: General;  Laterality: N/A;  . medial branch block  11/06/2015   lumbar facet Dr. Primus Bravo  . SENTINEL NODE BIOPSY Left 12/18/2014   Procedure: SENTINEL NODE BIOPSY;  Surgeon: Robert Bellow, MD;  Location: [facility] ORS;  Service: General;   Laterality: Left;   Family History  Problem Relation Age of Onset  . Breast cancer Sister   . Colon cancer Mother   . Hypertension Mother   . Asthma Mother   . Cancer Father    History  Sexual Activity  . Sexual activity: Not on file    Outpatient Encounter Prescriptions as of 09/22/2016  Medication Sig  . acetaminophen (TYLENOL) 500 MG tablet Take by mouth.  Marland Kitchen amLODipine (NORVASC) 2.5 MG tablet Take 1 tablet (2.5 mg total) by mouth daily.  . cholecalciferol (VITAMIN D) 1000 units tablet Take 1,000 Units by mouth daily.  . cyclobenzaprine (FLEXERIL) 5 MG tablet Take 5 mg by mouth daily as needed.   . fluticasone (FLONASE) 50 MCG/ACT nasal spray Place 1 spray into both nostrils as needed for allergies or rhinitis.  . furosemide (LASIX) 20 MG tablet Take 1 tablet (20 mg total) by mouth daily.  Marland Kitchen gabapentin (NEURONTIN) 300 MG capsule Take 300 mg by mouth daily as needed.   Marland Kitchen letrozole (FEMARA) 2.5 MG tablet TAKE 1 TABLET EVERY DAY  . meloxicam (MOBIC) 15 MG tablet Take 1 tablet (15 mg total) by mouth daily.  Marland Kitchen pyridoxine (B-6) 100 MG tablet Take 100 mg by mouth daily.  . vitamin B-12 (CYANOCOBALAMIN) 500 MCG tablet Take 500 mcg by  mouth daily.  . cyclobenzaprine (FLEXERIL) 10 MG tablet   . ibuprofen (ADVIL,MOTRIN) 200 MG tablet Take by mouth.  Marland Kitchen omeprazole (PRILOSEC) 20 MG capsule Take 20 mg by mouth 2 (two) times daily before a meal.  . oxyCODONE 10 MG TABS Take 1 tablet (10 mg total) by mouth every 3 (three) hours as needed for severe pain or breakthrough pain. (Patient not taking: Reported on 09/22/2016)  . sertraline (ZOLOFT) 50 MG tablet TAKE FOUR TABLETS BY MOUTH AT BEDTIME (Patient not taking: Reported on 09/22/2016)   Facility-Administered Encounter Medications as of 09/22/2016  Medication  . heparin lock flush 100 unit/mL  . sodium chloride 0.9 % injection 10 mL  . sodium chloride flush (NS) 0.9 % injection 10 mL    Activities of Daily Living In your present state of  health, do you have any difficulty performing the following activities: 09/22/2016 04/18/2016  Hearing? N N  Vision? Y N  Difficulty concentrating or making decisions? N N  Walking or climbing stairs? Y N  Dressing or bathing? N N  Doing errands, shopping? N N  Preparing Food and eating ? N -  Using the Toilet? N -  In the past six months, have you accidently leaked urine? N -  Do you have problems with loss of bowel control? N -  Managing your Medications? N -  Managing your Finances? N -  Housekeeping or managing your Housekeeping? N -  Some recent data might be hidden    Patient Care Team: Mar Daring, PA-C as PCP - General (Family Medicine) [name], MD as Consulting Physician (Oncology) [name], MD as Attending Physician (Pain Medicine) Trinna Post, PA-C as Physician Assistant (Physician Assistant) Crista Curb. Gloriann Loan, MD as Consulting Physician (Ophthalmology)    Assessment:     Exercise Activities and Dietary recommendations Current Exercise Habits: The patient does not participate in regular exercise at present, Exercise limited by: orthopedic condition(s) (back pain)  Goals    None     Fall Risk Fall Risk  09/22/2016 03/04/2016 02/24/2016 01/27/2016 01/20/2016  Falls in the past year? Yes No No No No  Number falls in past yr: 1 - - - -  Injury with Fall? No - - - -  Risk for fall due to : - - - - -  Follow up Falls prevention discussed - - - -   Depression Screen PHQ 2/9 Scores 09/22/2016 03/04/2016 02/24/2016 01/20/2016  PHQ - 2 Score 2 0 0 0  PHQ- 9 Score 9 - - -  Exception Documentation - - - -     Cognitive Function     6CIT Screen 09/22/2016  What Year? 0 points  What month? 0 points  What time? 0 points  Count back from 20 0 points  Months in reverse 0 points  Repeat phrase 0 points  Total Score 0    Immunization History  Administered Date(s) Administered  . Influenza,inj,Quad PF,36+ Mos 04/20/2016   Screening Tests Health  Maintenance  Topic Date Due  . PNA vac Low Risk Adult (1 of 2 - PCV13) 09/22/2015  . TETANUS/TDAP  07/13/2026 (Originally 09/21/1969)  . MAMMOGRAM  06/23/2018  . COLONOSCOPY  04/23/2021  . INFLUENZA VACCINE  Completed  . DEXA SCAN  Completed  . Hepatitis C Screening  Completed      Plan:  I have personally reviewed and addressed the Medicare Annual Wellness questionnaire and have noted the following in the patient's chart:  A. Medical and  social history B. Use of alcohol, tobacco or illicit drugs  C. Current medications and supplements D. Functional ability and status E.  Nutritional status F.  Physical activity G. Advance directives H. List of other physicians I.  Hospitalizations, surgeries, and ER visits in previous 12 months J.  [place] such as hearing and vision if needed, cognitive and depression L. Referrals and appointments - none  In addition, I have reviewed and discussed with patient certain preventive protocols, quality metrics, and best practice recommendations. A written personalized care plan for preventive services as well as general preventive health recommendations were provided to patient.  See attached scanned questionnaire for additional information.   Signed,  Fabio Neighbors, LPN Nurse Health Advisor   MD Recommendations: Pt would like to speak with Fabio Bering today about pneumonia vaccine.

## 2016-09-23 LAB — COMPREHENSIVE METABOLIC PANEL
ALT: 18 IU/L (ref 0–32)
AST: 21 IU/L (ref 0–40)
Albumin/Globulin Ratio: 1.7 (ref 1.2–2.2)
Albumin: 4.8 g/dL (ref 3.6–4.8)
Alkaline Phosphatase: 98 IU/L (ref 39–117)
BUN/Creatinine Ratio: 17 (ref 12–28)
BUN: 18 mg/dL (ref 8–27)
Bilirubin Total: 0.8 mg/dL (ref 0.0–1.2)
CO2: 26 mmol/L (ref 18–29)
Calcium: 10.4 mg/dL — ABNORMAL HIGH (ref 8.7–10.3)
Chloride: 100 mmol/L (ref 96–106)
Creatinine, Ser: 1.08 mg/dL — ABNORMAL HIGH (ref 0.57–1.00)
GFR calc Af Amer: 62 mL/min/{1.73_m2} (ref >59)
GFR calc non Af Amer: 54 mL/min/{1.73_m2} — ABNORMAL LOW (ref >59)
Globulin, Total: 2.8 g/dL (ref 1.5–4.5)
Glucose: 81 mg/dL (ref 65–99)
Potassium: 4.3 mmol/L (ref 3.5–5.2)
Sodium: 141 mmol/L (ref 134–144)
Total Protein: 7.6 g/dL (ref 6.0–8.5)

## 2016-09-23 LAB — LIPID PANEL
Chol/HDL Ratio: 3.2 ratio units (ref 0.0–4.4)
Cholesterol, Total: 221 mg/dL — ABNORMAL HIGH (ref 100–199)
HDL: 70 mg/dL (ref >39)
LDL Calculated: 133 mg/dL — ABNORMAL HIGH (ref 0–99)
Triglycerides: 91 mg/dL (ref 0–149)
VLDL Cholesterol Cal: 18 mg/dL (ref 5–40)

## 2016-09-29 ENCOUNTER — Other Ambulatory Visit: Payer: Self-pay | Admitting: Physician Assistant

## 2016-09-29 ENCOUNTER — Telehealth: Payer: Self-pay

## 2016-09-29 DIAGNOSIS — R7989 Other specified abnormal findings of blood chemistry: Secondary | ICD-10-CM

## 2016-09-29 NOTE — Telephone Encounter (Signed)
-----   Message from Trinna Post, [place] sent at 09/29/2016  1:40 PM EDT ----- Serum creatinine remains slightly elevated since hospital visit in October. Possible dehydration, would like to repeat CMET in one month after patient has pushed fluids. Also, calcium is a little high so I ordered some follow up labs for that as well. Patient doesn't have to be fasting for this, may get these at her convenience. Will put orders in. Cholesterol stable, patient declined statin.

## 2016-09-29 NOTE — Telephone Encounter (Signed)
lmtcb Chayse Gracey Drozdowski, CMA  

## 2016-10-01 NOTE — Telephone Encounter (Signed)
FYI: Patient advised as directed below. She will call for the lab requisition when ready.  Thanks,  -Lashona Schaaf

## 2016-10-27 ENCOUNTER — Ambulatory Visit (INDEPENDENT_AMBULATORY_CARE_PROVIDER_SITE_OTHER): Payer: Medicare PPO | Admitting: Physician Assistant

## 2016-10-27 ENCOUNTER — Encounter: Payer: Self-pay | Admitting: Physician Assistant

## 2016-10-27 VITALS — BP 118/82 | HR 68 | Temp 98.6°F | Resp 16 | Wt 194.0 lb

## 2016-10-27 DIAGNOSIS — F32A Depression, unspecified: Secondary | ICD-10-CM

## 2016-10-27 DIAGNOSIS — F329 Major depressive disorder, single episode, unspecified: Secondary | ICD-10-CM | POA: Diagnosis not present

## 2016-10-27 DIAGNOSIS — R944 Abnormal results of kidney function studies: Secondary | ICD-10-CM | POA: Diagnosis not present

## 2016-10-27 NOTE — Patient Instructions (Signed)
Increase zoloft by 50 mg every two weeks until you reach 200 mg   Major Depressive Disorder, Adult Major depressive disorder (MDD) is a mental health condition. MDD often makes you feel sad, hopeless, or helpless. MDD can also cause symptoms in your body. MDD can affect your:  Work.  School.  Relationships.  Other normal activities. MDD can range from mild to very bad. It may occur once (single episode MDD). It can also occur many times (recurrent MDD). The main symptoms of MDD often include:  Feeling sad, depressed, or irritable most of the time.  Loss of interest. MDD symptoms also include:  Sleeping too much or too little.  Eating too much or too little.  A change in your weight.  Feeling tired (fatigue) or having low energy.  Feeling worthless.  Feeling guilty.  Trouble making decisions.  Trouble thinking clearly.  Thoughts of suicide or harming others.  Feeling weak.  Feeling agitated.  Keeping yourself from being around other people (isolation). Follow these instructions at home: Activity   Do these things as told by your doctor:  Go back to your normal activities.  Exercise regularly.  Spend time outdoors. Alcohol   Talk with your doctor about how alcohol can affect your antidepressant medicines.  Do not drink alcohol. Or, limit how much alcohol you drink.  This means no more than 1 drink a day for nonpregnant women and 2 drinks a day for men. One drink equals one of these:  12 oz of beer.  5 oz of wine.  1 oz of hard liquor. General instructions   Take over-the-counter and prescription medicines only as told by your doctor.  Eat a healthy diet.  Get plenty of sleep.  Find activities that you enjoy. Make time to do them.  Think about joining a support group. Your doctor may be able to suggest a group for you.  Keep all follow-up visits as told by your doctor. This is important. Where to find more information:   [employer] Mental Illness:  www.nami.org  [place] National Institute of Mental Health:  https://carter.com/  National Suicide Prevention Lifeline:  430-524-1948. This is free, 24-hour help. Contact a doctor if:  Your symptoms get worse.  You have new symptoms. Get help right away if:  You self-harm.  You see, hear, taste, smell, or feel things that are not present (hallucinate). If you ever feel like you may hurt yourself or others, or have thoughts about taking your own life, get help right away. You can go to your nearest emergency department or call:  Your local emergency services (911 in the [place]  A suicide crisis helpline, such as the [place]:  817-281-0035. This is open 24 hours a day. This information is not intended to replace advice given to you by your health care provider. Make sure you discuss any questions you have with your health care provider. Document Released: 06/10/2015 Document Revised: 03/15/2016 Document Reviewed: 03/15/2016 Elsevier Interactive Patient Education  2017 [employer].

## 2016-10-27 NOTE — Progress Notes (Signed)
Patient: Carrie Alvarez Female    DOB: 19-May-1951   66 y.o.   MRN: [id number] Visit Date: 10/27/2016  Today's Provider: Trinna Post, PA-C   Chief Complaint  Patient presents with  . Depression    Four week follow up  . Leg Pain    Left leg; worsening especially in the last three weeks.    Subjective:    Depression       The patient presents with depression.  This is a chronic problem.The problem is unchanged (Pt restarted Zoloft; right now she is taking 100mg  a day.  She is not to the 150mg  yet.  She reports no improvement with her depression/anxiety. ).  Associated symptoms include fatigue, restlessness, decreased interest, body aches and myalgias.  Associated symptoms include no decreased concentration, no helplessness, no hopelessness, does not have insomnia, not irritable, no appetite change, no headaches, no indigestion, not sad and no suicidal ideas.  Past treatments include SSRIs - Selective serotonin reuptake inhibitors.  Compliance with treatment is good.  Previous treatment provided no relief relief.  Past medical history includes depression.   Leg Pain   There was no injury mechanism. The pain is present in the left thigh. She reports no foreign bodies present.   Carrie Alvarez is a 66 y/o woman with history of chronic pain 2/2 lumbosacral DDD managed by pain medicine, breast cancer, and depression following up today for depression. Prior to last visit, she was on 200 mg Zoloft daily. She discontinued this because her insurance stopped paying for it. I restarted this medication at last visit as the generic and had her take 50 mg daily and increase by 50 mg every two weeks. She is currently on 100 mg daily and is about to increase to 150 mg daily. She is doing okay. She has a lot of life stressors, including her ill mother and her husband from which she is separated. Appears to be in a lot of pain from her back/leg pain.   She also has some follow up labs that I want  her get today since she had high calcium and some abnormal kidney function last lab.  No Known Allergies   Current Outpatient Prescriptions:  .  acetaminophen (TYLENOL) 500 MG tablet, Take by mouth., Disp: , Rfl:  .  amLODipine (NORVASC) 2.5 MG tablet, Take 1 tablet (2.5 mg total) by mouth daily., Disp: 90 tablet, Rfl: 1 .  cholecalciferol (VITAMIN D) 1000 units tablet, Take 1,000 Units by mouth daily., Disp: , Rfl:  .  cyclobenzaprine (FLEXERIL) 10 MG tablet, , Disp: , Rfl:  .  fluticasone (FLONASE) 50 MCG/ACT nasal spray, Place 1 spray into both nostrils as needed for allergies or rhinitis., Disp: , Rfl:  .  furosemide (LASIX) 20 MG tablet, Take 1 tablet (20 mg total) by mouth daily., Disp: 30 tablet, Rfl: 3 .  gabapentin (NEURONTIN) 300 MG capsule, Take 300 mg by mouth daily as needed. , Disp: , Rfl:  .  ibuprofen (ADVIL,MOTRIN) 200 MG tablet, Take by mouth., Disp: , Rfl:  .  letrozole (FEMARA) 2.5 MG tablet, TAKE 1 TABLET EVERY DAY, Disp: 90 tablet, Rfl: 0 .  meloxicam (MOBIC) 15 MG tablet, Take 1 tablet (15 mg total) by mouth daily., Disp: 90 tablet, Rfl: 1 .  omeprazole (PRILOSEC) 20 MG capsule, Take 20 mg by mouth 2 (two) times daily before a meal., Disp: , Rfl:  .  oxyCODONE 10 MG TABS, Take 1 tablet (10 mg total)  by mouth every 3 (three) hours as needed for severe pain or breakthrough pain., Disp: 30 tablet, Rfl: 0 .  sertraline (ZOLOFT) 50 MG tablet, Take 50 mg for two weeks, then 100mg  for two weeks, 150 for two weeks, 200 for two weeks., Disp: 30 tablet, Rfl: 3 .  vitamin B-12 (CYANOCOBALAMIN) 500 MCG tablet, Take 500 mcg by mouth daily., Disp: , Rfl:  .  cyclobenzaprine (FLEXERIL) 5 MG tablet, Take 5 mg by mouth daily as needed. , Disp: , Rfl:  .  pyridoxine (B-6) 100 MG tablet, Take 100 mg by mouth daily., Disp: , Rfl:  No current facility-administered medications for this visit.   Facility-Administered Medications Ordered in Other Visits:  .  heparin lock flush 100 unit/mL,  500 Units, Intravenous, Once, [name], MD .  sodium chloride 0.9 % injection 10 mL, 10 mL, Intravenous, Once, [name], MD .  sodium chloride flush (NS) 0.9 % injection 10 mL, 10 mL, Intravenous, PRN, [name], MD, 10 mL at 12/24/15 1444  Review of Systems  Constitutional: Positive for fatigue. Negative for activity change, appetite change, chills, diaphoresis, fever and unexpected weight change.  Cardiovascular: Negative.   Gastrointestinal: Negative.   Musculoskeletal: Positive for myalgias.       Left leg pain.    Neurological: Negative for dizziness, light-headedness and headaches.  Psychiatric/Behavioral: Positive for depression and dysphoric mood. Negative for agitation, behavioral problems, confusion, decreased concentration, hallucinations, self-injury, sleep disturbance and suicidal ideas. The patient is nervous/anxious. The patient does not have insomnia and is not hyperactive.     Social History  Substance Use Topics  . Smoking status: Former Smoker    Packs/day: 0.50    Types: Cigarettes    Quit date: 07/18/2014  . Smokeless tobacco: Never Used     Comment: given smoking and pain information   . Alcohol use No   Objective:   BP 118/82 (BP Location: Left Arm, Patient Position: Sitting, Cuff Size: Normal)   Pulse 68   Temp 98.6 F (37 C) (Oral)   Resp 16   Wt 194 lb (88 kg)   BMI 34.37 kg/m  Vitals:   10/27/16 0936  BP: 118/82  Pulse: 68  Resp: 16  Temp: 98.6 F (37 C)  TempSrc: Oral  Weight: 194 lb (88 kg)     Physical Exam  Constitutional: She is oriented to person, place, and time. She appears well-developed and well-nourished. She is not irritable.  Cardiovascular: Normal rate.   Pulmonary/Chest: Effort normal.  Neurological: She is alert and oriented to person, place, and time.  Skin: Skin is warm and dry.  Psychiatric: Her behavior is normal. She exhibits a depressed mood.        Assessment & Plan:     1.  Depression, unspecified depression type  Keep titrating up by 50 mg every two weeks. PHQ 9 is down to 3 from 9 last visit. No SI/HI. Will see back in four months. Patient doesn't want counseling.  2. Hypercalcemia  - PTH, Intact and Calcium  3. Kidney function test abnormal  - BUN - Creatinine  The entirety of the information documented in the History of Present Illness, Review of Systems and Physical Exam were personally obtained by me. Portions of this information were initially documented by Ashley Royalty, CMA and reviewed by me for thoroughness and accuracy.   Return in about 4 months (around 02/26/2017) for depression.       Trinna Post, PA-C  [facility]

## 2016-10-28 LAB — BUN: BUN: 11 mg/dL (ref 8–27)

## 2016-10-28 LAB — PTH, INTACT AND CALCIUM
Calcium: 9.9 mg/dL (ref 8.7–10.3)
PTH: 40 pg/mL (ref 15–65)

## 2016-10-28 LAB — CREATININE, SERUM
Creatinine, Ser: 1.12 mg/dL — ABNORMAL HIGH (ref 0.57–1.00)
GFR calc Af Amer: 59 mL/min/{1.73_m2} — ABNORMAL LOW (ref >59)
GFR calc non Af Amer: 51 mL/min/{1.73_m2} — ABNORMAL LOW (ref >59)

## 2016-10-30 ENCOUNTER — Telehealth: Payer: Self-pay

## 2016-10-30 NOTE — Telephone Encounter (Signed)
-----   Message from Trinna Post, [place] sent at 10/29/2016  9:43 AM EDT ----- Her calcium has normalized and there is nothing further required with this. It looks like there has been a persistent slight decline in kidney function over the past six months. Would like her to minimize NSAID use, which includes things like her Mobic, ibuprofen, naproxen. Will continue to monitor this at her future appointments.

## 2016-10-30 NOTE — Telephone Encounter (Signed)
Pt advised.   Thanks,   -Rock Sobol  

## 2016-11-05 ENCOUNTER — Other Ambulatory Visit: Payer: Self-pay | Admitting: Oncology

## 2016-11-05 ENCOUNTER — Other Ambulatory Visit: Payer: Self-pay | Admitting: Physician Assistant

## 2016-11-05 DIAGNOSIS — C773 Secondary and unspecified malignant neoplasm of axilla and upper limb lymph nodes: Principal | ICD-10-CM

## 2016-11-05 DIAGNOSIS — F329 Major depressive disorder, single episode, unspecified: Secondary | ICD-10-CM

## 2016-11-05 DIAGNOSIS — F32A Depression, unspecified: Secondary | ICD-10-CM

## 2016-11-05 DIAGNOSIS — C50919 Malignant neoplasm of unspecified site of unspecified female breast: Secondary | ICD-10-CM

## 2016-11-05 NOTE — Telephone Encounter (Signed)
I have never seen patient. Seen by Fabio Bering and Simona Huh

## 2016-11-06 ENCOUNTER — Other Ambulatory Visit: Payer: Self-pay | Admitting: Physician Assistant

## 2016-11-06 DIAGNOSIS — I10 Essential (primary) hypertension: Secondary | ICD-10-CM

## 2016-11-09 NOTE — Telephone Encounter (Signed)
This refill request was sent to [facility]. Pt has been FU with you. Carrie Alvarez, CMA

## 2016-11-10 NOTE — Telephone Encounter (Signed)
Patient's refills for amlodipine and Meloxicam came through. I refilled the amlodipine but not the Meloxicam as she has been having some decline in her kidney function, which meloxicam can worsen. It looks like she went to the pain clinic with good relief for her back pain, but she hasn't been in a while. Is there a reason she hasn't gone back? Ideally, we would stop this medication to prevent any further injuries to her kidneys.

## 2016-12-28 ENCOUNTER — Ambulatory Visit (INDEPENDENT_AMBULATORY_CARE_PROVIDER_SITE_OTHER): Payer: Medicare PPO | Admitting: Family Medicine

## 2016-12-28 ENCOUNTER — Ambulatory Visit
Admission: RE | Admit: 2016-12-28 | Discharge: 2016-12-28 | Disposition: A | Discharge status: Home / Self Care | Payer: Medicare PPO | Source: Ambulatory Visit | Attending: Family Medicine | Admitting: Family Medicine

## 2016-12-28 ENCOUNTER — Other Ambulatory Visit: Payer: Self-pay

## 2016-12-28 ENCOUNTER — Telehealth: Payer: Self-pay

## 2016-12-28 ENCOUNTER — Encounter: Payer: Self-pay | Admitting: Family Medicine

## 2016-12-28 VITALS — BP 112/84 | HR 78 | Temp 98.3°F | Wt 190.6 lb

## 2016-12-28 DIAGNOSIS — R0600 Dyspnea, unspecified: Secondary | ICD-10-CM | POA: Insufficient documentation

## 2016-12-28 DIAGNOSIS — R05 Cough: Secondary | ICD-10-CM | POA: Insufficient documentation

## 2016-12-28 DIAGNOSIS — J209 Acute bronchitis, unspecified: Secondary | ICD-10-CM | POA: Diagnosis not present

## 2016-12-28 MED ORDER — BUDESONIDE-FORMOTEROL FUMARATE 80-4.5 MCG/ACT IN AERO: 2.0000 | INHALATION_SPRAY | Freq: Two times a day (BID) | RESPIRATORY_TRACT | 0 refills | Status: DC

## 2016-12-28 MED ORDER — ALBUTEROL SULFATE (2.5 MG/3ML) 0.083% IN NEBU
2.5000 mg | INHALATION_SOLUTION | Freq: Once | RESPIRATORY_TRACT | Status: AC
  Administered 2016-12-28: 2.5 mg via RESPIRATORY_TRACT

## 2016-12-28 MED ORDER — DOXYCYCLINE HYCLATE 100 MG PO TABS: 100.0000 mg | ORAL_TABLET | Freq: Two times a day (BID) | ORAL | 0 refills | Status: DC

## 2016-12-28 NOTE — Telephone Encounter (Addendum)
Left detailed message on cell voicemail advising patient of results. (DPR in chart)

## 2016-12-28 NOTE — Progress Notes (Signed)
Patient: Carrie Alvarez Female    DOB: [birth year]   66 y.o.   MRN: [id number] Visit Date: 12/28/2016  Today's Provider: Vernie Murders, PA   Chief Complaint  Patient presents with  . Cough  . Nasal Congestion   Subjective:    Cough  This is a new problem. Episode onset: 1 week  The problem has been gradually worsening. The problem occurs every few minutes. The cough is productive of sputum. Associated symptoms comments: Chest congestion . Treatments tried: OTC Copywriter, advertising. The treatment provided mild relief.   Patient Active Problem List   Diagnosis Date Noted  . Acute cholecystitis   . Transaminitis   . Cholangitis 04/18/2016  . Allergic rhinitis 09/04/2015  . AA (alopecia areata) 09/04/2015  . Arthritis, degenerative 09/04/2015  . Cancer of uterus ([place]) 09/04/2015  . Avitaminosis D 09/04/2015  . Benign positional vertigo 09/04/2015  . BP (high blood pressure) 03/15/2015  . DDD (degenerative disc disease), lumbar 03/07/2015  . Dizziness 12/25/2014  . Lumbosacral facet joint syndrome ([place]) 12/10/2014  . Sacroiliac joint dysfunction 12/10/2014  . DDD (degenerative disc disease), lumbosacral 11/13/2014  . Clinical depression 10/13/2014  . Breast cancer metastasized to axillary lymph node ([place]) 06/21/2014  . Hypercholesteremia 08/30/2009  . Compulsive tobacco user syndrome 04/21/2008  . Adaptation reaction 07/18/2007   Past Surgical History:  Procedure Laterality Date  . ABDOMINAL HYSTERECTOMY  1994   UTERINE CA  . AXILLARY LYMPH NODE BIOPSY Left 12/18/2014   Procedure: AXILLARY LYMPH NODE BIOPSY/;  Surgeon: Robert Bellow, MD;  Location: [facility] ORS;  Service: General;  Laterality: Left;  . AXILLARY LYMPH NODE DISSECTION Left 12/18/2014   Procedure: AXILLARY LYMPH NODE DISSECTION;  Surgeon: Robert Bellow, MD;  Location: [facility] ORS;  Service: General;  Laterality: Left;  . BREAST BIOPSY Left 05/2014   CORE - POS  . BREAST SURGERY    . CHOLECYSTECTOMY N/A  04/19/2016   Procedure: LAPAROSCOPIC CHOLECYSTECTOMY WITH INTRAOPERATIVE CHOLANGIOGRAM;  Surgeon: Hubbard Robinson, MD;  Location: [facility] ORS;  Service: General;  Laterality: N/A;  . medial branch block  11/06/2015   lumbar facet Dr. Primus Bravo  . SENTINEL NODE BIOPSY Left 12/18/2014   Procedure: SENTINEL NODE BIOPSY;  Surgeon: Robert Bellow, MD;  Location: [facility] ORS;  Service: General;  Laterality: Left;   Family History  Problem Relation Age of Onset  . Breast cancer Sister   . Colon cancer Mother   . Hypertension Mother   . Asthma Mother   . Cancer Father    No Known Allergies   Previous Medications   ACETAMINOPHEN (TYLENOL) 500 MG TABLET    Take by mouth.   AMLODIPINE (NORVASC) 2.5 MG TABLET    TAKE 1 TABLET EVERY DAY   CHOLECALCIFEROL (VITAMIN D) 1000 UNITS TABLET    Take 1,000 Units by mouth daily.   CYCLOBENZAPRINE (FLEXERIL) 10 MG TABLET       CYCLOBENZAPRINE (FLEXERIL) 5 MG TABLET    Take 5 mg by mouth daily as needed.    FLUTICASONE (FLONASE) 50 MCG/ACT NASAL SPRAY    Place 1 spray into both nostrils as needed for allergies or rhinitis.   FUROSEMIDE (LASIX) 20 MG TABLET    Take 1 tablet (20 mg total) by mouth daily.   GABAPENTIN (NEURONTIN) 300 MG CAPSULE    Take 300 mg by mouth daily as needed.    IBUPROFEN (ADVIL,MOTRIN) 200 MG TABLET    Take by mouth.   LETROZOLE (FEMARA) 2.5 MG TABLET  TAKE 1 TABLET EVERY DAY   MELOXICAM (MOBIC) 15 MG TABLET    Take 1 tablet (15 mg total) by mouth daily.   OMEPRAZOLE (PRILOSEC) 20 MG CAPSULE    Take 20 mg by mouth 2 (two) times daily before a meal.   OXYCODONE 10 MG TABS    Take 1 tablet (10 mg total) by mouth every 3 (three) hours as needed for severe pain or breakthrough pain.   PYRIDOXINE (B-6) 100 MG TABLET    Take 100 mg by mouth daily.   SERTRALINE (ZOLOFT) 50 MG TABLET    Take 4 tablets (200 mg total) by mouth daily.   TRAMADOL (ULTRAM) 50 MG TABLET       VITAMIN B-12 (CYANOCOBALAMIN) 500 MCG TABLET    Take 500 mcg by mouth daily.      Review of Systems  Constitutional: Negative.   HENT: Positive for congestion.   Respiratory: Positive for cough.   Cardiovascular: Negative.     Social History  Substance Use Topics  . Smoking status: Former Smoker    Packs/day: 0.50    Types: Cigarettes    Quit date: 07/18/2014  . Smokeless tobacco: Never Used     Comment: given smoking and pain information   . Alcohol use No   Objective:   BP 112/84 (BP Location: Right Arm, Patient Position: Sitting, Cuff Size: Normal)   Pulse 78   Temp 98.3 F (36.8 C) (Oral)   Wt 190 lb 9.6 oz (86.5 kg)   SpO2 99%   BMI 33.76 kg/m   Physical Exam  Constitutional: She is oriented to person, place, and time. She appears well-developed and well-nourished. No distress.  HENT:  Head: Normocephalic and atraumatic.  Right Ear: Hearing and external ear normal.  Left Ear: Hearing and external ear normal.  Nose: Nose normal.  Mouth/Throat: Oropharynx is clear and moist.  Eyes: Conjunctivae and lids are normal. Right eye exhibits no discharge. Left eye exhibits no discharge. No scleral icterus.  Neck: Neck supple.  Cardiovascular: Normal rate and regular rhythm.   Pulmonary/Chest: Effort normal. No respiratory distress.  Abdominal: Soft. Bowel sounds are normal.  Lymphadenopathy:    She has no cervical adenopathy.  Neurological: She is alert and oriented to person, place, and time.  Skin: Skin is intact. No lesion and no rash noted.  Psychiatric: She has a normal mood and affect. Her speech is normal and behavior is normal. Thought content normal.      Assessment & Plan:     1. Acute bronchitis, unspecified organism Onset with shortness of breath, questionable wheeze and cough over the past week. No fever but having some nasal congestion. Nebulizer treatment with Albuterol in the office helped breathing a little. Given Symbicort inhaler for 2 puffs BID, use Mucinex-DM for cough and add Doxycycline BID. Increase fluid intake and will get  CBC with CXR to rule out pneumonia. Recheck pending reports. - DG Chest 2 View - CBC with Differential/Platelet - doxycycline (VIBRA-TABS) 100 MG tablet; Take 1 tablet (100 mg total) by mouth 2 (two) times daily.  Dispense: 20 tablet; Refill: 0

## 2016-12-28 NOTE — Telephone Encounter (Signed)
-----   Message from [name], [place] sent at 12/28/2016  1:49 PM EDT ----- No sign of pneumonia or other cardiopulmonary disease. Proceed with medications and awaiting blood test results.

## 2016-12-28 NOTE — Progress Notes (Signed)
[place]  [phone] [phone]  ID: Norm Salt OB: March 02, 1951  MR#: [id number]  TMA#:[id number]  Patient Care Team: Rubye Beach as PCP - General (Family Medicine) [name], MD as Consulting Physician (Oncology) [name], MD as Attending Physician (Pain Medicine) Trinna Post, PA-C as Physician Assistant (Physician Assistant) [name]., MD as Consulting Physician (Ophthalmology)  CHIEF COMPLAINT: Stage IIa ER+ left breast cancer with no breast lesion and axillary lymph node metastasis.  INTERVAL HISTORY: Patient returns to clinic today for routine evaluation. She currently feels well and is asymptomatic. She does not complain of pain today. She continues to have periodic hot flashes, but otherwise is tolerating letrozole well. She continues to have a mild peripheral neuropathy, but has no other neurologic complaints. She denies any recent fevers. She has no chest pain or shortness of breath. She denies any nausea, vomiting, constipation, or diarrhea.  She has no urinary complaints. Patient offers no further specific complaints today.    REVIEW OF SYSTEMS:   Review of Systems  Constitutional: Positive for diaphoresis. Negative for fever, malaise/fatigue and weight loss.  Respiratory: Negative.  Negative for cough and shortness of breath.   Cardiovascular: Negative.  Negative for chest pain and leg swelling.  Gastrointestinal: Negative.   Genitourinary: Negative.   Musculoskeletal: Negative.   Skin: Negative.  Negative for rash.  Neurological: Positive for sensory change. Negative for dizziness, weakness and headaches.  Psychiatric/Behavioral: Negative for depression. The patient is not nervous/anxious.     As per HPI. Otherwise, a complete review of systems is negative.  PAST MEDICAL HISTORY: Past Medical History:  Diagnosis Date  . Allergy   . Anemia   . Back pain   . Breast cancer  ([place]) 2015   LT LUMPECTOMY 12-2014 FOLLOWING CHEMO  . Carpal tunnel syndrome   . Depression   . GERD (gastroesophageal reflux disease)   . Hypertension   . Obesity   . Uterine cancer ([place]) 1994    PAST SURGICAL HISTORY: Past Surgical History:  Procedure Laterality Date  . ABDOMINAL HYSTERECTOMY  1994   UTERINE CA  . AXILLARY LYMPH NODE BIOPSY Left 12/18/2014   Procedure: AXILLARY LYMPH NODE BIOPSY/;  Surgeon: Robert Bellow, MD;  Location: [facility] ORS;  Service: General;  Laterality: Left;  . AXILLARY LYMPH NODE DISSECTION Left 12/18/2014   Procedure: AXILLARY LYMPH NODE DISSECTION;  Surgeon: Robert Bellow, MD;  Location: [facility] ORS;  Service: General;  Laterality: Left;  . BREAST BIOPSY Left 05/2014   CORE - POS  . BREAST SURGERY    . CHOLECYSTECTOMY N/A 04/19/2016   Procedure: LAPAROSCOPIC CHOLECYSTECTOMY WITH INTRAOPERATIVE CHOLANGIOGRAM;  Surgeon: Hubbard Robinson, MD;  Location: [facility] ORS;  Service: General;  Laterality: N/A;  . medial branch block  11/06/2015   lumbar facet Dr. Primus Bravo  . SENTINEL NODE BIOPSY Left 12/18/2014   Procedure: SENTINEL NODE BIOPSY;  Surgeon: Robert Bellow, MD;  Location: [facility] ORS;  Service: General;  Laterality: Left;    FAMILY HISTORY Family History  Problem Relation Age of Onset  . Breast cancer Sister   . Colon cancer Mother   . Hypertension Mother   . Asthma Mother   . Cancer Father        ADVANCED DIRECTIVES:    HEALTH MAINTENANCE: Social History  Substance Use Topics  . Smoking status: Former Smoker    Packs/day: 0.50    Types: Cigarettes    Quit date: 07/18/2014  . Smokeless  tobacco: Never Used     Comment: given smoking and pain information   . Alcohol use No    No Known Allergies  Current Outpatient Prescriptions  Medication Sig Dispense Refill  . acetaminophen (TYLENOL) 500 MG tablet Take 500 mg by mouth every 6 (six) hours as needed for mild pain or moderate pain.     Marland Kitchen amLODipine (NORVASC) 2.5 MG tablet TAKE 1 TABLET  EVERY DAY 90 tablet 1  . budesonide-formoterol (SYMBICORT) 80-4.5 MCG/ACT inhaler Inhale 2 puffs into the lungs 2 (two) times daily. 1 Inhaler 0  . cholecalciferol (VITAMIN D) 1000 units tablet Take 1,000 Units by mouth daily.    . cyclobenzaprine (FLEXERIL) 5 MG tablet Take 5 mg by mouth daily as needed.     . doxycycline (VIBRA-TABS) 100 MG tablet Take 1 tablet (100 mg total) by mouth 2 (two) times daily. 20 tablet 0  . fluticasone (FLONASE) 50 MCG/ACT nasal spray Place 1 spray into both nostrils as needed for allergies or rhinitis.    . furosemide (LASIX) 20 MG tablet Take 1 tablet (20 mg total) by mouth daily. 30 tablet 3  . gabapentin (NEURONTIN) 300 MG capsule Take 300 mg by mouth daily as needed (neuropathic pain).     Marland Kitchen ibuprofen (ADVIL,MOTRIN) 200 MG tablet Take by mouth.    . pyridoxine (B-6) 100 MG tablet Take 100 mg by mouth daily.    . sertraline (ZOLOFT) 50 MG tablet Take 4 tablets (200 mg total) by mouth daily. 120 tablet 3  . traMADol (ULTRAM) 50 MG tablet Take 50 mg by mouth 4 (four) times daily.     . vitamin B-12 (CYANOCOBALAMIN) 500 MCG tablet Take 500 mcg by mouth daily.    Marland Kitchen letrozole (FEMARA) 2.5 MG tablet TAKE 1 TABLET EVERY DAY 90 tablet 0  . meloxicam (MOBIC) 15 MG tablet TAKE 1 TABLET (15 MG) DAILY 90 tablet 1   No current facility-administered medications for this visit.    Facility-Administered Medications Ordered in Other Visits  Medication Dose Route Frequency Provider Last Rate Last Dose  . heparin lock flush 100 unit/mL  500 Units Intravenous Once [name], MD      . sodium chloride 0.9 % injection 10 mL  10 mL Intravenous Once [name], MD      . sodium chloride flush (NS) 0.9 % injection 10 mL  10 mL Intravenous PRN [name], MD   10 mL at 12/24/15 1444    OBJECTIVE: Vitals:   12/29/16 1500  BP: 120/78  Pulse: 72  Temp: 97.8 F (36.6 C)     Body mass index is 33.13 kg/m.    ECOG FS:1 - Symptomatic but completely  ambulatory  General: Well-developed, well-nourished, no acute distress. Eyes: anicteric sclera. Breasts: Patient refused exam today. Lungs: Clear to auscultation bilaterally. Heart: Regular rate and rhythm. No rubs, murmurs, or gallops. Abdomen: Soft, nontender, nondistended.  Musculoskeletal: No edema, cyanosis, or clubbing. Neuro: Alert, answering all questions appropriately. Cranial nerves grossly intact. Skin: No rashes or petechiae noted. Psych: Normal affect.    LAB RESULTS:  Lab Results  Component Value Date   NA 141 09/22/2016   K 4.3 09/22/2016   CL 100 09/22/2016   CO2 26 09/22/2016   GLUCOSE 81 09/22/2016   BUN 11 10/27/2016   CREATININE 1.12 (H) 10/27/2016   CALCIUM 9.9 10/27/2016   PROT 7.6 09/22/2016   ALBUMIN 4.8 09/22/2016   AST 21 09/22/2016   ALT 18 09/22/2016  ALKPHOS 98 09/22/2016   BILITOT 0.8 09/22/2016   GFRNONAA 51 (L) 10/27/2016   GFRAA 59 (L) 10/27/2016    Lab Results  Component Value Date   WBC 9.2 12/28/2016   NEUTROABS 5.7 12/28/2016   HGB 12.4 12/28/2016   HCT 35.8 12/28/2016   MCV 81 12/28/2016   PLT 375 12/28/2016     STUDIES: Dg Chest 2 View  Result Date: 12/28/2016 CLINICAL DATA:  66 y/o  F; dyspnea and cough. EXAM: CHEST  2 VIEW COMPARISON:  04/18/2016 chest radiograph FINDINGS: The heart size and mediastinal contours are within normal limits. Both lungs are clear. Mild degenerative changes of thoracic spine. Right upper quadrant cholecystectomy clips. IMPRESSION: No active cardiopulmonary disease. Electronically Signed   By: Kristine Garbe M.D.   On: 12/28/2016 13:40    ASSESSMENT: Stage IIa ER+ left breast cancer with no breast lesion and axillary lymph node metastasis. ([id number])  HER-2 negative.   PLAN:   1. Stage IIa ER+ left breast cancer with no breast lesion and axillary lymph node metastasis: Despite no obvious breast lesion on mammogram or breast MRI, pathology and pattern of spread was consistent with  primary breast cancer. CT and bone scan with no other evidence of malignancy. Previously because of difficulty with treatment, patient only received 3 cycles of Adriamycin and Cytoxan. She did not complete 12 cycles of weekly Taxol.  Her last infusion of chemotherapy was on Nov 19, 2014. It was elected not to pursue axillary node dissection given the potential morbidity of this procedure. It was also elected not to pursue adjuvant XRT given there was no primary breast lesion. Continue with letrozole which patient will require to take for 5 years completing in July 2021. Baseline bone mineral density on January 24, 2015 with a T score of -0.7, which is considered normal. Repeat in July 2018. Her most recent mammogram on June 23, 2016 was reported as BI-RADS 1, repeat in December 2018. Return to clinic in 6 months for routine evaluation. 2. Family history: Patient's sister reports she has positive for Lynch syndrome and we are attempting to confirm these results. Patient may also benefit from genetic testing herself in the near future.  Patient had a colonoscopy in October 2012 that was reported as normal. 3. Hypertension: Treatment as per primary care.  4. Pain/Peripheral neuropathy: This does not complain of this today. 5. Hot flashes: Likely secondary to letrozole. Patient was offered a change in treatment, but she declined.   Patient expressed understanding and was in agreement with this plan. She also understands that She can call clinic at any time with any questions, concerns, or complaints.   Breast cancer metastasized to axillary lymph node   Staging form: Breast, AJCC 7th Edition     Clinical stage from 10/22/2014: Stage Unknown (TX, N1, M0) - Signed by [name], MD on 10/22/2014   [name], MD   12/31/2016 3:56 PM

## 2016-12-29 ENCOUNTER — Inpatient Hospital Stay: Payer: Medicare PPO | Attending: Oncology | Admitting: Oncology

## 2016-12-29 VITALS — BP 120/78 | HR 72 | Temp 97.8°F | Ht 63.0 in | Wt 187.0 lb

## 2016-12-29 DIAGNOSIS — Z79811 Long term (current) use of aromatase inhibitors: Secondary | ICD-10-CM | POA: Diagnosis not present

## 2016-12-29 DIAGNOSIS — R61 Generalized hyperhidrosis: Secondary | ICD-10-CM | POA: Diagnosis not present

## 2016-12-29 DIAGNOSIS — I10 Essential (primary) hypertension: Secondary | ICD-10-CM | POA: Insufficient documentation

## 2016-12-29 DIAGNOSIS — Z87891 Personal history of nicotine dependence: Secondary | ICD-10-CM | POA: Diagnosis not present

## 2016-12-29 DIAGNOSIS — Z170 Estrogen receptor positive status [ER+]: Secondary | ICD-10-CM | POA: Diagnosis not present

## 2016-12-29 DIAGNOSIS — C773 Secondary and unspecified malignant neoplasm of axilla and upper limb lymph nodes: Secondary | ICD-10-CM | POA: Diagnosis not present

## 2016-12-29 DIAGNOSIS — C50912 Malignant neoplasm of unspecified site of left female breast: Secondary | ICD-10-CM | POA: Insufficient documentation

## 2016-12-29 DIAGNOSIS — K219 Gastro-esophageal reflux disease without esophagitis: Secondary | ICD-10-CM | POA: Diagnosis not present

## 2016-12-29 DIAGNOSIS — G629 Polyneuropathy, unspecified: Secondary | ICD-10-CM | POA: Diagnosis not present

## 2016-12-29 DIAGNOSIS — Z800 Family history of malignant neoplasm of digestive organs: Secondary | ICD-10-CM | POA: Diagnosis not present

## 2016-12-29 DIAGNOSIS — Z79899 Other long term (current) drug therapy: Secondary | ICD-10-CM | POA: Insufficient documentation

## 2016-12-29 DIAGNOSIS — R232 Flushing: Secondary | ICD-10-CM

## 2016-12-29 DIAGNOSIS — Z803 Family history of malignant neoplasm of breast: Secondary | ICD-10-CM

## 2016-12-29 DIAGNOSIS — C50919 Malignant neoplasm of unspecified site of unspecified female breast: Secondary | ICD-10-CM

## 2016-12-29 LAB — CBC WITH DIFFERENTIAL/PLATELET
Basophils Absolute: 0 10*3/uL (ref 0.0–0.2)
Basos: 0 %
EOS (ABSOLUTE): 0.1 10*3/uL (ref 0.0–0.4)
Eos: 1 %
Hematocrit: 35.8 % (ref 34.0–46.6)
Hemoglobin: 12.4 g/dL (ref 11.1–15.9)
Immature Grans (Abs): 0.1 10*3/uL (ref 0.0–0.1)
Immature Granulocytes: 1 %
Lymphocytes Absolute: 2.8 10*3/uL (ref 0.7–3.1)
Lymphs: 31 %
MCH: 28.1 pg (ref 26.6–33.0)
MCHC: 34.6 g/dL (ref 31.5–35.7)
MCV: 81 fL (ref 79–97)
Monocytes Absolute: 0.4 10*3/uL (ref 0.1–0.9)
Monocytes: 5 %
Neutrophils Absolute: 5.7 10*3/uL (ref 1.4–7.0)
Neutrophils: 62 %
Platelets: 375 10*3/uL (ref 150–379)
RBC: 4.42 x10E6/uL (ref 3.77–5.28)
RDW: 14.8 % (ref 12.3–15.4)
WBC: 9.2 10*3/uL (ref 3.4–10.8)

## 2016-12-29 MED ORDER — LETROZOLE 2.5 MG PO TABS: 2.5000 mg | ORAL_TABLET | Freq: Every day | ORAL | 3 refills | Status: DC

## 2016-12-29 NOTE — Progress Notes (Signed)
Advised  ED 

## 2016-12-29 NOTE — Progress Notes (Signed)
Patient here for breast cancer follow-up. Pt reports frequent episodes of drenching hot flashes. Pt reports that she will be under going back surgery in July. She states that she needs a RF for femara sent to [facility]. RN pended RF for md approval. Other than the hot flashes, she states that she has tolerated the femara well. She has not missed any dosing of the femara.  Pt also requesting bil. Screening mammogram to be set up in December.   Pt reports performing self breast exams.

## 2016-12-31 ENCOUNTER — Other Ambulatory Visit: Payer: Self-pay | Admitting: Physician Assistant

## 2016-12-31 ENCOUNTER — Other Ambulatory Visit: Payer: Self-pay | Admitting: Oncology

## 2016-12-31 DIAGNOSIS — C773 Secondary and unspecified malignant neoplasm of axilla and upper limb lymph nodes: Principal | ICD-10-CM

## 2016-12-31 DIAGNOSIS — C50912 Malignant neoplasm of unspecified site of left female breast: Secondary | ICD-10-CM

## 2016-12-31 DIAGNOSIS — F32A Depression, unspecified: Secondary | ICD-10-CM

## 2016-12-31 DIAGNOSIS — F329 Major depressive disorder, single episode, unspecified: Secondary | ICD-10-CM

## 2016-12-31 DIAGNOSIS — C50919 Malignant neoplasm of unspecified site of unspecified female breast: Secondary | ICD-10-CM

## 2017-01-19 DIAGNOSIS — M533 Sacrococcygeal disorders, not elsewhere classified: Secondary | ICD-10-CM | POA: Diagnosis not present

## 2017-01-19 DIAGNOSIS — M791 Myalgia: Secondary | ICD-10-CM | POA: Diagnosis not present

## 2017-01-19 DIAGNOSIS — M5416 Radiculopathy, lumbar region: Secondary | ICD-10-CM | POA: Diagnosis not present

## 2017-01-19 DIAGNOSIS — M47817 Spondylosis without myelopathy or radiculopathy, lumbosacral region: Secondary | ICD-10-CM | POA: Diagnosis not present

## 2017-01-26 DIAGNOSIS — M47816 Spondylosis without myelopathy or radiculopathy, lumbar region: Secondary | ICD-10-CM | POA: Diagnosis not present

## 2017-01-29 ENCOUNTER — Telehealth: Payer: Self-pay | Admitting: Physician Assistant

## 2017-01-29 DIAGNOSIS — I10 Essential (primary) hypertension: Secondary | ICD-10-CM

## 2017-01-29 MED ORDER — AMLODIPINE BESYLATE 2.5 MG PO TABS: 2.5000 mg | ORAL_TABLET | Freq: Every day | ORAL | 1 refills | Status: DC

## 2017-01-29 NOTE — Telephone Encounter (Signed)
Pt informed and voiced understanding of results. 

## 2017-01-29 NOTE — Telephone Encounter (Signed)
Refill sent.

## 2017-01-29 NOTE — Telephone Encounter (Signed)
pt would like rx of her blood pressure pills to be call into [facility]  Amlodipine 2.5mg   She said her insurance has changed and she will not be doing mail order.  She is completely out of her medication.  Thanks [employer]

## 2017-02-09 DIAGNOSIS — M47816 Spondylosis without myelopathy or radiculopathy, lumbar region: Secondary | ICD-10-CM | POA: Diagnosis not present

## 2017-02-22 DIAGNOSIS — M5416 Radiculopathy, lumbar region: Secondary | ICD-10-CM | POA: Diagnosis not present

## 2017-02-22 DIAGNOSIS — M533 Sacrococcygeal disorders, not elsewhere classified: Secondary | ICD-10-CM | POA: Diagnosis not present

## 2017-02-22 DIAGNOSIS — M47817 Spondylosis without myelopathy or radiculopathy, lumbosacral region: Secondary | ICD-10-CM | POA: Diagnosis not present

## 2017-02-22 DIAGNOSIS — M791 Myalgia: Secondary | ICD-10-CM | POA: Diagnosis not present

## 2017-02-24 ENCOUNTER — Encounter: Payer: Self-pay | Admitting: Physician Assistant

## 2017-02-24 ENCOUNTER — Ambulatory Visit (INDEPENDENT_AMBULATORY_CARE_PROVIDER_SITE_OTHER): Payer: Medicare HMO | Admitting: Physician Assistant

## 2017-02-24 VITALS — BP 132/86 | HR 80 | Temp 98.1°F | Resp 16 | Wt 190.0 lb

## 2017-02-24 DIAGNOSIS — I10 Essential (primary) hypertension: Secondary | ICD-10-CM

## 2017-02-24 DIAGNOSIS — R609 Edema, unspecified: Secondary | ICD-10-CM | POA: Diagnosis not present

## 2017-02-24 DIAGNOSIS — R600 Localized edema: Secondary | ICD-10-CM

## 2017-02-24 DIAGNOSIS — F329 Major depressive disorder, single episode, unspecified: Secondary | ICD-10-CM

## 2017-02-24 DIAGNOSIS — Z636 Dependent relative needing care at home: Secondary | ICD-10-CM

## 2017-02-24 DIAGNOSIS — F32A Depression, unspecified: Secondary | ICD-10-CM

## 2017-02-24 DIAGNOSIS — R69 Illness, unspecified: Secondary | ICD-10-CM | POA: Diagnosis not present

## 2017-02-24 MED ORDER — SERTRALINE HCL 100 MG PO TABS: 200.0000 mg | ORAL_TABLET | Freq: Every day | ORAL | 0 refills | Status: DC

## 2017-02-24 MED ORDER — AMLODIPINE BESYLATE 2.5 MG PO TABS: 2.5000 mg | ORAL_TABLET | Freq: Every day | ORAL | 1 refills | Status: DC

## 2017-02-24 NOTE — Patient Instructions (Signed)

## 2017-02-24 NOTE — Progress Notes (Signed)
Patient: Carrie Alvarez Female    DOB: 17-Jan-1951   66 y.o.   MRN: [id number] Visit Date: 02/24/2017  Today's Provider: Trinna Post, PA-C   Chief Complaint  Patient presents with  . Depression   Subjective:      Carrie Alvarez is a 66 y/o woman with history of clinical depression and HTN presenting today for follow up.   Most recently, she has titrated back up to 200 mg QD Zoloft. She denies any major improvement. Still exhausted, little appetite. Trouble sleeping. Denies SI/HI. 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 She reports her siblings are of minimal help and the care giving burden rests largely on her. She does get occasional help from her daughter with bathing and such. Also in near constant pain, treated at pain clinic at [facility].   She also has hypertension and is on 2.5 mg amlodipine. She is stable on this medication, no side effects. Used to have lower leg edema, took lasix intermittently, but has not taken recently and has not had lower extremity edema. She also has hyperlipidemia. She declines statin.   Depression         This is a chronic problem.The problem is unchanged.  Associated symptoms include fatigue and insomnia.  Associated symptoms include no decreased concentration, no helplessness, no hopelessness, not irritable, no restlessness, no decreased interest, no appetite change, no body aches, no myalgias, no headaches, no indigestion, not sad and no suicidal ideas.  Compliance with treatment is good.     No Known Allergies   Current Outpatient Prescriptions:  .  acetaminophen (TYLENOL) 500 MG tablet, Take 500 mg by mouth every 6 (six) hours as needed for mild pain or moderate pain. , Disp: , Rfl:  .  amLODipine (NORVASC) 2.5 MG tablet, Take 1 tablet (2.5 mg total) by mouth daily., Disp: 90 tablet, Rfl: 1 .  budesonide-formoterol (SYMBICORT) 80-4.5 MCG/ACT inhaler, Inhale 2 puffs into  the lungs 2 (two) times daily., Disp: 1 Inhaler, Rfl: 0 .  cholecalciferol (VITAMIN D) 1000 units tablet, Take 1,000 Units by mouth daily., Disp: , Rfl:  .  cyclobenzaprine (FLEXERIL) 5 MG tablet, Take 5 mg by mouth daily as needed. , Disp: , Rfl:  .  fluticasone (FLONASE) 50 MCG/ACT nasal spray, Place 1 spray into both nostrils as needed for allergies or rhinitis., Disp: , Rfl:  .  furosemide (LASIX) 20 MG tablet, Take 1 tablet (20 mg total) by mouth daily., Disp: 30 tablet, Rfl: 3 .  gabapentin (NEURONTIN) 300 MG capsule, Take 300 mg by mouth daily as needed (neuropathic pain). , Disp: , Rfl:  .  ibuprofen (ADVIL,MOTRIN) 200 MG tablet, Take by mouth., Disp: , Rfl:  .  letrozole (FEMARA) 2.5 MG tablet, TAKE 1 TABLET EVERY DAY, Disp: 90 tablet, Rfl: 0 .  meloxicam (MOBIC) 15 MG tablet, TAKE 1 TABLET (15 MG) DAILY, Disp: 90 tablet, Rfl: 1 .  pyridoxine (B-6) 100 MG tablet, Take 100 mg by mouth daily., Disp: , Rfl:  .  sertraline (ZOLOFT) 50 MG tablet, TAKE 4 TABLETS EVERY DAY, Disp: 360 tablet, Rfl: 3 .  traMADol (ULTRAM) 50 MG tablet, Take 50 mg by mouth 4 (four) times daily. , Disp: , Rfl:  .  vitamin B-12 (CYANOCOBALAMIN) 500 MCG tablet, Take 500 mcg by mouth daily., Disp: , Rfl:  No current facility-administered medications for this visit.   Facility-Administered Medications Ordered in Other Visits:  .  heparin lock flush 100 unit/mL, 500 Units, Intravenous, Once, Finnegan, Kathlene November, MD .  sodium chloride 0.9 % injection 10 mL, 10 mL, Intravenous, Once, Finnegan, Kathlene November, MD .  sodium chloride flush (NS) 0.9 % injection 10 mL, 10 mL, Intravenous, PRN, [name], MD, 10 mL at 12/24/15 1444  Review of Systems  Constitutional: Positive for fatigue. Negative for activity change, appetite change, chills, diaphoresis, fever and unexpected weight change.  Respiratory: Negative.   Cardiovascular: Positive for leg swelling. Negative for chest pain and palpitations.  Gastrointestinal:  Negative.   Musculoskeletal: Negative for myalgias.  Neurological: Negative for dizziness, light-headedness and headaches.  Psychiatric/Behavioral: Positive for depression, dysphoric mood and sleep disturbance. Negative for agitation, behavioral problems, confusion, decreased concentration, hallucinations, self-injury and suicidal ideas. The patient has insomnia. The patient is not nervous/anxious and is not hyperactive.     Social History  Substance Use Topics  . Smoking status: Former Smoker    Packs/day: 0.50    Types: Cigarettes    Quit date: 07/18/2014  . Smokeless tobacco: Never Used     Comment: given smoking and pain information   . Alcohol use No   Objective:   BP 132/86 (BP Location: Right Arm, Patient Position: Sitting, Cuff Size: Normal)   Pulse 80   Temp 98.1 F (36.7 C) (Oral)   Resp 16   Wt 190 lb (86.2 kg)   BMI 33.66 kg/m  Vitals:   02/24/17 0856  BP: 132/86  Pulse: 80  Resp: 16  Temp: 98.1 F (36.7 C)  TempSrc: Oral  Weight: 190 lb (86.2 kg)     Physical Exam  Constitutional: She is oriented to person, place, and time. She appears well-developed and well-nourished. She is not irritable.  Cardiovascular: Normal rate and normal heart sounds.   Musculoskeletal: She exhibits no edema.  Neurological: She is alert and oriented to person, place, and time.  Skin: Skin is warm and dry.  Psychiatric: Her behavior is normal. She exhibits a depressed mood.        Assessment & Plan:     1. Depression, unspecified depression type  Stable, but still pretty poorly controlled. Will put in referral for care coordination so patient can get connected with caregiver support group.   - sertraline (ZOLOFT) 100 MG tablet; Take 2 tablets (200 mg total) by mouth daily.  Dispense: 180 tablet; Refill: 0  2. Peripheral edema  Absent. Will not refill Lasix.  3. Essential hypertension  - amLODipine (NORVASC) 2.5 MG tablet; Take 1 tablet (2.5 mg total) by mouth daily.   Dispense: 90 tablet; Refill: 1  4. Caregiver burden  - Ambulatory referral to [facility]  5. Lower extremity edema  Absent.  Return in about 6 months (around 08/27/2017).  The entirety of the information documented in the History of Present Illness, Review of Systems and Physical Exam were personally obtained by me. Portions of this information were initially documented by Ashley Royalty, CMA and reviewed by me for thoroughness and accuracy.          Trinna Post, PA-C  [facility]

## 2017-03-01 DIAGNOSIS — M47816 Spondylosis without myelopathy or radiculopathy, lumbar region: Secondary | ICD-10-CM | POA: Diagnosis not present

## 2017-03-17 DIAGNOSIS — M47816 Spondylosis without myelopathy or radiculopathy, lumbar region: Secondary | ICD-10-CM | POA: Diagnosis not present

## 2017-03-31 ENCOUNTER — Encounter: Payer: Self-pay | Admitting: Physician Assistant

## 2017-04-07 DIAGNOSIS — M47816 Spondylosis without myelopathy or radiculopathy, lumbar region: Secondary | ICD-10-CM | POA: Diagnosis not present

## 2017-04-13 DIAGNOSIS — M47817 Spondylosis without myelopathy or radiculopathy, lumbosacral region: Secondary | ICD-10-CM | POA: Diagnosis not present

## 2017-04-13 DIAGNOSIS — M5416 Radiculopathy, lumbar region: Secondary | ICD-10-CM | POA: Diagnosis not present

## 2017-04-13 DIAGNOSIS — D0592 Unspecified type of carcinoma in situ of left breast: Secondary | ICD-10-CM | POA: Diagnosis not present

## 2017-04-13 DIAGNOSIS — M5136 Other intervertebral disc degeneration, lumbar region: Secondary | ICD-10-CM | POA: Diagnosis not present

## 2017-04-13 DIAGNOSIS — M545 Low back pain: Secondary | ICD-10-CM | POA: Diagnosis not present

## 2017-04-13 DIAGNOSIS — G894 Chronic pain syndrome: Secondary | ICD-10-CM | POA: Diagnosis not present

## 2017-04-13 DIAGNOSIS — Z5181 Encounter for therapeutic drug level monitoring: Secondary | ICD-10-CM | POA: Diagnosis not present

## 2017-04-13 DIAGNOSIS — Z79891 Long term (current) use of opiate analgesic: Secondary | ICD-10-CM | POA: Diagnosis not present

## 2017-04-21 ENCOUNTER — Other Ambulatory Visit: Payer: Self-pay | Admitting: Physician Assistant

## 2017-04-21 MED ORDER — MELOXICAM 15 MG PO TABS: ORAL_TABLET | ORAL | 1 refills | Status: DC

## 2017-04-21 NOTE — Telephone Encounter (Signed)
Last OV 02/24/17 and last RF 12/31/16

## 2017-04-21 NOTE — Telephone Encounter (Signed)
Meloxicam refilled  

## 2017-04-21 NOTE — Telephone Encounter (Signed)
Patient is requesting refills sent to [facility] on her meloxicam (MOBIC) 15 MG tablet

## 2017-04-21 NOTE — Telephone Encounter (Signed)
Patient advised.

## 2017-05-10 DIAGNOSIS — M47816 Spondylosis without myelopathy or radiculopathy, lumbar region: Secondary | ICD-10-CM | POA: Diagnosis not present

## 2017-05-19 DIAGNOSIS — D0592 Unspecified type of carcinoma in situ of left breast: Secondary | ICD-10-CM | POA: Diagnosis not present

## 2017-05-19 DIAGNOSIS — Z5181 Encounter for therapeutic drug level monitoring: Secondary | ICD-10-CM | POA: Diagnosis not present

## 2017-05-19 DIAGNOSIS — M545 Low back pain: Secondary | ICD-10-CM | POA: Diagnosis not present

## 2017-05-19 DIAGNOSIS — M5136 Other intervertebral disc degeneration, lumbar region: Secondary | ICD-10-CM | POA: Diagnosis not present

## 2017-06-07 ENCOUNTER — Encounter: Payer: Self-pay | Admitting: Physician Assistant

## 2017-06-07 ENCOUNTER — Ambulatory Visit (INDEPENDENT_AMBULATORY_CARE_PROVIDER_SITE_OTHER): Payer: Medicare HMO | Admitting: Physician Assistant

## 2017-06-07 VITALS — BP 140/98 | HR 84 | Temp 98.5°F | Resp 16 | Wt 199.0 lb

## 2017-06-07 DIAGNOSIS — M25561 Pain in right knee: Secondary | ICD-10-CM

## 2017-06-07 DIAGNOSIS — I10 Essential (primary) hypertension: Secondary | ICD-10-CM

## 2017-06-07 DIAGNOSIS — Z23 Encounter for immunization: Secondary | ICD-10-CM | POA: Diagnosis not present

## 2017-06-07 DIAGNOSIS — R062 Wheezing: Secondary | ICD-10-CM | POA: Diagnosis not present

## 2017-06-07 DIAGNOSIS — M25512 Pain in left shoulder: Secondary | ICD-10-CM

## 2017-06-07 DIAGNOSIS — Z87891 Personal history of nicotine dependence: Secondary | ICD-10-CM | POA: Diagnosis not present

## 2017-06-07 DIAGNOSIS — G8929 Other chronic pain: Secondary | ICD-10-CM

## 2017-06-07 MED ORDER — AMLODIPINE BESYLATE 5 MG PO TABS: 5.0000 mg | ORAL_TABLET | Freq: Every day | ORAL | 0 refills | Status: DC

## 2017-06-07 MED ORDER — BUDESONIDE-FORMOTEROL FUMARATE 80-4.5 MCG/ACT IN AERO: 2.0000 | INHALATION_SPRAY | Freq: Two times a day (BID) | RESPIRATORY_TRACT | 0 refills | Status: DC

## 2017-06-07 NOTE — Patient Instructions (Signed)

## 2017-06-07 NOTE — Progress Notes (Signed)
Patient: Carrie Alvarez Female    DOB: Nov 23, 1950   66 y.o.   MRN: [id number] Visit Date: 06/07/2017  Today's Provider: Trinna Post, PA-C   Chief Complaint  Patient presents with  . Hypertension  . Joint Swelling    Right knee, Started Wednesday   Subjective:    Carrie Alvarez is a 66 y/o woman presenting today for multiple issues.   First is the issue of her blood pressure, which has been increased lately. Pertinents below. She is taking 2.5mg  amlodipine daily, which she is tolerating well. Her mother whom she was caring for full time died in 04-27-23 and this has been a significant stressor.  Second is her right knee pain, ongoing by one week. No injury mechanism. No history of falls. Does have history of arthritis. Does see pain management for several orthopedic conditions. Hurts with extension and walking. Is swollen.  Also mentions that her pain management physician, Dr. Primus Bravo in [place], suggested that her PCP order a left shoulder MRI because it might be cheaper.   Hypertension  This is a chronic problem. The problem is uncontrolled. Pertinent negatives include no anxiety, blurred vision, chest pain, headaches, malaise/fatigue, neck pain, orthopnea, palpitations, peripheral edema, PND, shortness of breath or sweats. There are no associated agents to hypertension.  Knee Pain   The incident occurred 5 to 7 days ago. There was no injury mechanism. The pain is present in the right knee. The quality of the pain is described as aching and stabbing. The pain has been worsening since onset. Pertinent negatives include no inability to bear weight, loss of motion, loss of sensation, muscle weakness, numbness or tingling. She reports no foreign bodies present. The symptoms are aggravated by palpation and weight bearing.       No Known Allergies   Current Outpatient Medications:  .  acetaminophen (TYLENOL) 500 MG tablet, Take 500 mg by mouth every 6 (six)  hours as needed for mild pain or moderate pain. , Disp: , Rfl:  .  amLODipine (NORVASC) 2.5 MG tablet, Take 1 tablet (2.5 mg total) by mouth daily., Disp: 90 tablet, Rfl: 1 .  budesonide-formoterol (SYMBICORT) 80-4.5 MCG/ACT inhaler, Inhale 2 puffs into the lungs 2 (two) times daily., Disp: 1 Inhaler, Rfl: 0 .  cholecalciferol (VITAMIN D) 1000 units tablet, Take 1,000 Units by mouth daily., Disp: , Rfl:  .  cyclobenzaprine (FLEXERIL) 5 MG tablet, Take 5 mg by mouth daily as needed. , Disp: , Rfl:  .  fluticasone (FLONASE) 50 MCG/ACT nasal spray, Place 1 spray into both nostrils as needed for allergies or rhinitis., Disp: , Rfl:  .  gabapentin (NEURONTIN) 300 MG capsule, Take 300 mg by mouth daily as needed (neuropathic pain). , Disp: , Rfl:  .  ibuprofen (ADVIL,MOTRIN) 200 MG tablet, Take by mouth., Disp: , Rfl:  .  letrozole (FEMARA) 2.5 MG tablet, TAKE 1 TABLET EVERY DAY, Disp: 90 tablet, Rfl: 0 .  meloxicam (MOBIC) 15 MG tablet, TAKE 1 TABLET (15 MG) DAILY, Disp: 90 tablet, Rfl: 1 .  pyridoxine (B-6) 100 MG tablet, Take 100 mg by mouth daily., Disp: , Rfl:  .  traMADol (ULTRAM) 50 MG tablet, Take 50 mg by mouth 4 (four) times daily. , Disp: , Rfl:  .  vitamin B-12 (CYANOCOBALAMIN) 500 MCG tablet, Take 500 mcg by mouth daily., Disp: , Rfl:  .  sertraline (ZOLOFT) 100 MG tablet, Take 2 tablets (200 mg total) by mouth daily., Disp:  180 tablet, Rfl: 0 No current facility-administered medications for this visit.   Facility-Administered Medications Ordered in Other Visits:  .  heparin lock flush 100 unit/mL, 500 Units, Intravenous, Once, Finnegan, Kathlene November, MD .  sodium chloride 0.9 % injection 10 mL, 10 mL, Intravenous, Once, Finnegan, Kathlene November, MD .  sodium chloride flush (NS) 0.9 % injection 10 mL, 10 mL, Intravenous, PRN, [name], MD, 10 mL at 12/24/15 1444  Review of Systems  Constitutional: Negative.  Negative for malaise/fatigue.  Eyes: Negative for blurred vision.    Respiratory: Negative.  Negative for shortness of breath.   Cardiovascular: Negative.  Negative for chest pain, palpitations, orthopnea and PND.  Gastrointestinal: Negative.   Musculoskeletal: Positive for arthralgias and joint swelling. Negative for back pain, gait problem, myalgias, neck pain and neck stiffness.  Neurological: Negative for dizziness, tingling, numbness and headaches.    Social History   Tobacco Use  . Smoking status: Former Smoker    Packs/day: 0.50    Types: Cigarettes    Last attempt to quit: 07/18/2014    Years since quitting: 2.8  . Smokeless tobacco: Never Used  . Tobacco comment: given smoking and pain information   Substance Use Topics  . Alcohol use: No    Alcohol/week: 0.0 oz   Objective:   BP (!) 140/98 (BP Location: Right Arm, Patient Position: Sitting, Cuff Size: Normal)   Pulse 84   Temp 98.5 F (36.9 C) (Oral)   Resp 16   Wt 199 lb (90.3 kg)   BMI 35.25 kg/m  Vitals:   06/07/17 1312  BP: (!) 140/98  Pulse: 84  Resp: 16  Temp: 98.5 F (36.9 C)  TempSrc: Oral  Weight: 199 lb (90.3 kg)     Physical Exam  Constitutional: She is oriented to person, place, and time. She appears well-developed and well-nourished.  Cardiovascular: Normal rate and regular rhythm.  Pulmonary/Chest: Effort normal and breath sounds normal.  Musculoskeletal: She exhibits edema and tenderness. She exhibits no deformity.       Right knee: She exhibits decreased range of motion and swelling. She exhibits no effusion, no ecchymosis, no deformity, no laceration, no erythema, normal alignment, no LCL laxity, normal meniscus and no MCL laxity. Tenderness found. Medial joint line and MCL tenderness noted.  There is no pedal edema.   Neurological: She is alert and oriented to person, place, and time.  Skin: Skin is warm and dry.  Psychiatric: She has a normal mood and affect. Her behavior is normal.        Assessment & Plan:     1. Essential hypertension  Will  increase to 5 mg Norvasc, check at her next February appointment.   - amLODipine (NORVASC) 5 MG tablet; Take 1 tablet (5 mg total) by mouth daily.  Dispense: 90 tablet; Refill: 0  2. Chronic left shoulder pain  She sees pain management and it sounds like she is being evaluated for rotator cuff issues, however I have never evaluated her for this nor am I sure which test specifically to order. I have told her as much and suggested if she would like to evaluate costs, she should call her insurance and see which facility would be the cheapest.  3. Acute pain of right knee  Some swelling and medial joint line tenderness, suspect some MCL injury. Offered xray which patient declined. Suggest ice, rest, elevation until acute phase is over. May need further eval with MRI if pain continues.  4. Wheezing  She was on this in the past and would like to resume. She has a history of smoking.  - budesonide-formoterol (SYMBICORT) 80-4.5 MCG/ACT inhaler; Inhale 2 puffs into the lungs 2 (two) times daily.  Dispense: 1 Inhaler; Refill: 0  5. History of tobacco abuse  - budesonide-formoterol (SYMBICORT) 80-4.5 MCG/ACT inhaler; Inhale 2 puffs into the lungs 2 (two) times daily.  Dispense: 1 Inhaler; Refill: 0  6. Influenza vaccination administered at current visit  - Flu vaccine HIGH DOSE PF (Fluzone High dose)  Return if symptoms worsen or fail to improve.  The entirety of the information documented in the History of Present Illness, Review of Systems and Physical Exam were personally obtained by me. Portions of this information were initially documented by Ashley Royalty, CMA and reviewed by me for thoroughness and accuracy.         Trinna Post, PA-C  [facility]

## 2017-06-16 DIAGNOSIS — M479 Spondylosis, unspecified: Secondary | ICD-10-CM | POA: Diagnosis not present

## 2017-06-16 DIAGNOSIS — Z5181 Encounter for therapeutic drug level monitoring: Secondary | ICD-10-CM | POA: Diagnosis not present

## 2017-06-16 DIAGNOSIS — M19011 Primary osteoarthritis, right shoulder: Secondary | ICD-10-CM | POA: Diagnosis not present

## 2017-06-16 DIAGNOSIS — M13861 Other specified arthritis, right knee: Secondary | ICD-10-CM | POA: Diagnosis not present

## 2017-06-26 NOTE — Progress Notes (Signed)
[facility]  [phone] [phone]  ID: Carrie Alvarez OB: 05/17/1951  MR#: [id number]  CSN#:[id number]  Patient Care Team: Burnette, Jennifer M, PA-C as PCP - General (Family Medicine) Ahuva Poynor J, MD as Consulting Physician (Oncology) Crisp, Gregory, MD as Attending Physician (Pain Medicine) Pollak, Adriana M, PA-C as Physician Assistant (Physician Assistant) Bell, Phillip T., MD as Consulting Physician (Ophthalmology)  CHIEF COMPLAINT: Stage IIa ER+ left breast cancer with no breast lesion and axillary lymph node metastasis.  INTERVAL HISTORY: Patient returns to clinic today for routine 6 month evaluation. She continues to feel well and is asymptomatic. She does not complain of pain today. She continues to have periodic hot flashes, but otherwise is tolerating letrozole well. She continues to have a mild peripheral neuropathy, but has no other neurologic complaints. She denies any recent fevers. She has no chest pain or shortness of breath. She denies any nausea, vomiting, constipation, or diarrhea.  She has no urinary complaints. Patient offers no further specific complaints today.    REVIEW OF SYSTEMS:   Review of Systems  Constitutional: Positive for diaphoresis. Negative for fever, malaise/fatigue and weight loss.  Respiratory: Negative.  Negative for cough and shortness of breath.   Cardiovascular: Negative.  Negative for chest pain and leg swelling.  Gastrointestinal: Negative.  Negative for abdominal pain.  Genitourinary: Negative.  Negative for dysuria.  Musculoskeletal: Positive for back pain.  Skin: Negative.  Negative for rash.  Neurological: Positive for sensory change. Negative for dizziness, weakness and headaches.  Psychiatric/Behavioral: Negative.  Negative for depression. The patient is not nervous/anxious.     As per HPI. Otherwise, a complete review of systems is negative.  PAST MEDICAL HISTORY: Past  Medical History:  Diagnosis Date  . Allergy   . Anemia   . Back pain   . Breast cancer (HCC) 2015   LT LUMPECTOMY 12-2014 FOLLOWING CHEMO  . Carpal tunnel syndrome   . Depression   . GERD (gastroesophageal reflux disease)   . Hypertension   . Obesity   . Uterine cancer (HCC) 1994    PAST SURGICAL HISTORY: Past Surgical History:  Procedure Laterality Date  . ABDOMINAL HYSTERECTOMY  1994   UTERINE CA  . AXILLARY LYMPH NODE BIOPSY Left 12/18/2014   Procedure: AXILLARY LYMPH NODE BIOPSY/;  Surgeon: Jeffrey W Byrnett, MD;  Location: [facility] ORS;  Service: General;  Laterality: Left;  . AXILLARY LYMPH NODE DISSECTION Left 12/18/2014   Procedure: AXILLARY LYMPH NODE DISSECTION;  Surgeon: Jeffrey W Byrnett, MD;  Location: [facility] ORS;  Service: General;  Laterality: Left;  . BREAST BIOPSY Left 05/2014   CORE - POS  . BREAST SURGERY    . CHOLECYSTECTOMY N/A 04/19/2016   Procedure: LAPAROSCOPIC CHOLECYSTECTOMY WITH INTRAOPERATIVE CHOLANGIOGRAM;  Surgeon: Catherine L Loflin, MD;  Location: [facility] ORS;  Service: General;  Laterality: N/A;  . medial branch block  11/06/2015   lumbar facet Dr. Crisp  . SENTINEL NODE BIOPSY Left 12/18/2014   Procedure: SENTINEL NODE BIOPSY;  Surgeon: Jeffrey W Byrnett, MD;  Location: [facility] ORS;  Service: General;  Laterality: Left;    FAMILY HISTORY Family History  Problem Relation Age of Onset  . Breast cancer Sister 55  . Colon cancer Mother   . Hypertension Mother   . Asthma Mother   . Cancer Father        ADVANCED DIRECTIVES:    HEALTH MAINTENANCE: Social History   Tobacco Use  . Smoking status: Former Smoker    Packs/day: 0.50      Types: Cigarettes    Last attempt to quit: 07/18/2014    Years since quitting: 2.9  . Smokeless tobacco: Never Used  . Tobacco comment: given smoking and pain information   Substance Use Topics  . Alcohol use: No    Alcohol/week: 0.0 oz  . Drug use: No    No Known Allergies  Current Outpatient Medications  Medication  Sig Dispense Refill  . acetaminophen (TYLENOL) 500 MG tablet Take 500 mg by mouth every 6 (six) hours as needed for mild pain or moderate pain.     Marland Kitchen amLODipine (NORVASC) 5 MG tablet Take 1 tablet (5 mg total) by mouth daily. 90 tablet 0  . budesonide-formoterol (SYMBICORT) 80-4.5 MCG/ACT inhaler Inhale 2 puffs into the lungs 2 (two) times daily. 1 Inhaler 0  . cholecalciferol (VITAMIN D) 1000 units tablet Take 1,000 Units by mouth daily.    . cyclobenzaprine (FLEXERIL) 5 MG tablet Take 5 mg by mouth daily as needed.     . fluticasone (FLONASE) 50 MCG/ACT nasal spray Place 1 spray into both nostrils as needed for allergies or rhinitis.    Marland Kitchen gabapentin (NEURONTIN) 300 MG capsule Take 300 mg by mouth daily as needed (neuropathic pain).     Marland Kitchen ibuprofen (ADVIL,MOTRIN) 200 MG tablet Take by mouth.    . letrozole (FEMARA) 2.5 MG tablet Take 1 tablet (2.5 mg total) by mouth daily. 90 tablet 3  . meloxicam (MOBIC) 15 MG tablet TAKE 1 TABLET (15 MG) DAILY 90 tablet 1  . pyridoxine (B-6) 100 MG tablet Take 100 mg by mouth daily.    . traMADol (ULTRAM) 50 MG tablet Take 50 mg by mouth 4 (four) times daily.     . vitamin B-12 (CYANOCOBALAMIN) 500 MCG tablet Take 500 mcg by mouth daily.    . sertraline (ZOLOFT) 100 MG tablet Take 2 tablets (200 mg total) by mouth daily. 180 tablet 0   No current facility-administered medications for this visit.    Facility-Administered Medications Ordered in Other Visits  Medication Dose Route Frequency Provider Last Rate Last Dose  . heparin lock flush 100 unit/mL  500 Units Intravenous Once [name], MD      . sodium chloride 0.9 % injection 10 mL  10 mL Intravenous Once [name], MD      . sodium chloride flush (NS) 0.9 % injection 10 mL  10 mL Intravenous PRN [name], MD   10 mL at 12/24/15 1444    OBJECTIVE: Vitals:   06/29/17 1512  BP: (!) 143/92  Pulse: 96  Resp: 18  Temp: (!) 97 F (36.1 C)     Body mass index is 35.07  kg/m.    ECOG FS:1 - Symptomatic but completely ambulatory  General: Well-developed, well-nourished, no acute distress. Eyes: anicteric sclera. Breasts: Patient refused exam today. Lungs: Clear to auscultation bilaterally. Heart: Regular rate and rhythm. No rubs, murmurs, or gallops. Abdomen: Soft, nontender, nondistended.  Musculoskeletal: No edema, cyanosis, or clubbing. Neuro: Alert, answering all questions appropriately. Cranial nerves grossly intact. Skin: No rashes or petechiae noted. Psych: Normal affect.    LAB RESULTS:  Lab Results  Component Value Date   NA 141 09/22/2016   K 4.3 09/22/2016   CL 100 09/22/2016   CO2 26 09/22/2016   GLUCOSE 81 09/22/2016   BUN 11 10/27/2016   CREATININE 1.12 (H) 10/27/2016   CALCIUM 9.9 10/27/2016   PROT 7.6 09/22/2016   ALBUMIN 4.8 09/22/2016   AST 21 09/22/2016  ALT 18 09/22/2016   ALKPHOS 98 09/22/2016   BILITOT 0.8 09/22/2016   GFRNONAA 51 (L) 10/27/2016   GFRAA 59 (L) 10/27/2016    Lab Results  Component Value Date   WBC 9.2 12/28/2016   NEUTROABS 5.7 12/28/2016   HGB 12.4 12/28/2016   HCT 35.8 12/28/2016   MCV 81 12/28/2016   PLT 375 12/28/2016     STUDIES: Mm Diag Breast Tomo Bilateral  Result Date: 06/28/2017 CLINICAL DATA:  History of metastatic left axillary lymph node with unknown breast primary. Patient is being treated with left axillary dissection, chemotherapy and antiestrogen therapy since 2015. The patient is asymptomatic. EXAM: 2D DIGITAL DIAGNOSTIC BILATERAL MAMMOGRAM WITH CAD AND ADJUNCT TOMO COMPARISON:  Previous exam(s). ACR Breast Density Category c: The breast tissue is heterogeneously dense, which may obscure small masses. FINDINGS: Mammographically, there are no suspicious masses, areas of architectural distortion or microcalcifications in either breast. Left axillary dissection postsurgical changes are seen. Mammographic images were processed with CAD. IMPRESSION: No mammographic evidence of  malignancy in either breast. Continued surveillance with yearly diagnostic mammograms is recommended. RECOMMENDATION: Diagnostic mammogram is suggested in 1 year. (Code:DM-B-01Y) I have discussed the findings and recommendations with the patient. Results were also provided in writing at the conclusion of the visit. If applicable, a reminder letter will be sent to the patient regarding the next appointment. BI-RADS CATEGORY  2: Benign. Electronically Signed   By: Fidela Salisbury M.D.   On: 06/28/2017 10:47    ASSESSMENT: Stage IIa ER+ left breast cancer with no breast lesion and axillary lymph node metastasis. (TxN1M0)  HER-2 negative.   PLAN:   1. Stage IIa ER+ left breast cancer with no breast lesion and axillary lymph node metastasis: Despite no obvious breast lesion on mammogram or breast MRI, pathology and pattern of spread was consistent with primary breast cancer. CT and bone scan at time of diagnosis revealed no other evidence of malignancy. Previously because of difficulty with treatment, patient only received 3 cycles of Adriamycin and Cytoxan. She did not complete 12 cycles of weekly Taxol.  Her last infusion of chemotherapy was on Nov 19, 2014. It was elected not to pursue axillary node dissection given the potential morbidity of this procedure. It was also elected not to pursue adjuvant XRT given there was no primary breast lesion. Continue with letrozole which patient will require to take for 5 years completing in July 2021. Baseline bone mineral density on January 24, 2015 with a T score of -0.7, which is considered normal. Repeat in July 2018. Her most recent mammogram on June 28, 2017 was reported as BI-RADS 2, repeat in December 2019. Return to clinic in 6 months for routine evaluation. 2. Family history: Patient's sister reports she has positive for Lynch syndrome and we are attempting to confirm these results. Patient may also benefit from genetic testing herself in the near future.   Patient had a colonoscopy in October 2012 that was reported as normal. 3. Hypertension: Patient's blood pressure is mildly elevated today, continue evaluation and treatment per primary care.  4. Pain/Peripheral neuropathy: This does not complain of this today. 5. Hot flashes: Likely secondary to letrozole. Patient was offered a change in treatment, but she declined.   Patient expressed understanding and was in agreement with this plan. She also understands that She can call clinic at any time with any questions, concerns, or complaints.   Breast cancer metastasized to axillary lymph node   Staging form: Breast, AJCC 7th Edition  Clinical stage from 10/22/2014: Stage Unknown (TX, N1, M0) - Signed by Timothy J Finnegan, MD on 10/22/2014   Timothy J Finnegan, MD   07/02/2017 8:01 AM  

## 2017-06-28 ENCOUNTER — Ambulatory Visit
Admission: RE | Admit: 2017-06-28 | Discharge: 2017-06-28 | Disposition: A | Discharge status: Home / Self Care | Payer: Medicare HMO | Source: Ambulatory Visit | Attending: Oncology | Admitting: Oncology

## 2017-06-28 ENCOUNTER — Other Ambulatory Visit: Payer: Self-pay | Admitting: Oncology

## 2017-06-28 DIAGNOSIS — C50919 Malignant neoplasm of unspecified site of unspecified female breast: Secondary | ICD-10-CM

## 2017-06-28 DIAGNOSIS — R922 Inconclusive mammogram: Secondary | ICD-10-CM | POA: Diagnosis not present

## 2017-06-28 DIAGNOSIS — C773 Secondary and unspecified malignant neoplasm of axilla and upper limb lymph nodes: Secondary | ICD-10-CM | POA: Insufficient documentation

## 2017-06-28 DIAGNOSIS — C50912 Malignant neoplasm of unspecified site of left female breast: Secondary | ICD-10-CM

## 2017-06-28 DIAGNOSIS — Z853 Personal history of malignant neoplasm of breast: Secondary | ICD-10-CM | POA: Insufficient documentation

## 2017-06-29 ENCOUNTER — Inpatient Hospital Stay: Payer: Medicare HMO | Attending: Oncology | Admitting: Oncology

## 2017-06-29 VITALS — BP 143/92 | HR 96 | Temp 97.0°F | Resp 18 | Wt 198.0 lb

## 2017-06-29 DIAGNOSIS — K219 Gastro-esophageal reflux disease without esophagitis: Secondary | ICD-10-CM

## 2017-06-29 DIAGNOSIS — Z800 Family history of malignant neoplasm of digestive organs: Secondary | ICD-10-CM

## 2017-06-29 DIAGNOSIS — Z803 Family history of malignant neoplasm of breast: Secondary | ICD-10-CM

## 2017-06-29 DIAGNOSIS — R232 Flushing: Secondary | ICD-10-CM | POA: Diagnosis not present

## 2017-06-29 DIAGNOSIS — Z170 Estrogen receptor positive status [ER+]: Secondary | ICD-10-CM

## 2017-06-29 DIAGNOSIS — Z87891 Personal history of nicotine dependence: Secondary | ICD-10-CM | POA: Diagnosis not present

## 2017-06-29 DIAGNOSIS — Z79811 Long term (current) use of aromatase inhibitors: Secondary | ICD-10-CM | POA: Diagnosis not present

## 2017-06-29 DIAGNOSIS — I10 Essential (primary) hypertension: Secondary | ICD-10-CM

## 2017-06-29 DIAGNOSIS — C773 Secondary and unspecified malignant neoplasm of axilla and upper limb lymph nodes: Secondary | ICD-10-CM | POA: Diagnosis not present

## 2017-06-29 DIAGNOSIS — Z79899 Other long term (current) drug therapy: Secondary | ICD-10-CM | POA: Insufficient documentation

## 2017-06-29 DIAGNOSIS — C50912 Malignant neoplasm of unspecified site of left female breast: Secondary | ICD-10-CM | POA: Diagnosis not present

## 2017-06-29 DIAGNOSIS — C50919 Malignant neoplasm of unspecified site of unspecified female breast: Secondary | ICD-10-CM

## 2017-06-29 DIAGNOSIS — M549 Dorsalgia, unspecified: Secondary | ICD-10-CM | POA: Insufficient documentation

## 2017-06-29 DIAGNOSIS — Z809 Family history of malignant neoplasm, unspecified: Secondary | ICD-10-CM

## 2017-06-29 MED ORDER — LETROZOLE 2.5 MG PO TABS: 2.5000 mg | ORAL_TABLET | Freq: Every day | ORAL | 3 refills | Status: DC

## 2017-07-07 DIAGNOSIS — M13861 Other specified arthritis, right knee: Secondary | ICD-10-CM | POA: Diagnosis not present

## 2017-07-07 DIAGNOSIS — M479 Spondylosis, unspecified: Secondary | ICD-10-CM | POA: Diagnosis not present

## 2017-07-07 DIAGNOSIS — R69 Illness, unspecified: Secondary | ICD-10-CM | POA: Diagnosis not present

## 2017-07-07 DIAGNOSIS — Z5181 Encounter for therapeutic drug level monitoring: Secondary | ICD-10-CM | POA: Diagnosis not present

## 2017-07-07 DIAGNOSIS — M19011 Primary osteoarthritis, right shoulder: Secondary | ICD-10-CM | POA: Diagnosis not present

## 2017-08-23 DIAGNOSIS — M13861 Other specified arthritis, right knee: Secondary | ICD-10-CM | POA: Diagnosis not present

## 2017-08-23 DIAGNOSIS — M479 Spondylosis, unspecified: Secondary | ICD-10-CM | POA: Diagnosis not present

## 2017-08-23 DIAGNOSIS — Z5181 Encounter for therapeutic drug level monitoring: Secondary | ICD-10-CM | POA: Diagnosis not present

## 2017-08-23 DIAGNOSIS — M19011 Primary osteoarthritis, right shoulder: Secondary | ICD-10-CM | POA: Diagnosis not present

## 2017-08-27 ENCOUNTER — Ambulatory Visit: Payer: Medicare HMO | Admitting: Physician Assistant

## 2017-08-31 ENCOUNTER — Ambulatory Visit (INDEPENDENT_AMBULATORY_CARE_PROVIDER_SITE_OTHER): Payer: Medicare HMO | Admitting: Physician Assistant

## 2017-08-31 ENCOUNTER — Encounter: Payer: Self-pay | Admitting: Physician Assistant

## 2017-08-31 VITALS — BP 146/96 | HR 72 | Temp 98.4°F | Resp 16 | Wt 200.0 lb

## 2017-08-31 DIAGNOSIS — I10 Essential (primary) hypertension: Secondary | ICD-10-CM | POA: Diagnosis not present

## 2017-08-31 DIAGNOSIS — F32A Depression, unspecified: Secondary | ICD-10-CM

## 2017-08-31 DIAGNOSIS — E7800 Pure hypercholesterolemia, unspecified: Secondary | ICD-10-CM | POA: Diagnosis not present

## 2017-08-31 DIAGNOSIS — R69 Illness, unspecified: Secondary | ICD-10-CM | POA: Diagnosis not present

## 2017-08-31 DIAGNOSIS — F329 Major depressive disorder, single episode, unspecified: Secondary | ICD-10-CM

## 2017-08-31 MED ORDER — SERTRALINE HCL 100 MG PO TABS: 200.0000 mg | ORAL_TABLET | Freq: Every day | ORAL | 0 refills | Status: DC

## 2017-08-31 MED ORDER — AMLODIPINE BESYLATE 10 MG PO TABS: 10.0000 mg | ORAL_TABLET | Freq: Every day | ORAL | 3 refills | Status: DC

## 2017-08-31 NOTE — Progress Notes (Signed)
Patient: Carrie Alvarez Female    DOB: 10/12/1950   67 y.o.   MRN: [id number] Visit Date: 08/31/2017  Today's Provider: Trinna Post, PA-C   Chief Complaint  Patient presents with  . Hypertension   Subjective:    Carrie Alvarez is a 67 y/o woman presenting today for follow up of HTN and HLD. At last visit, her amlodipine was increased from 2.5 mg daily to 5 mg daily. She reports good tolerance and compliance with this change. Her BP remains elevated today. She continues to follow with orthopedics/pain management in [place] and she feels her chronic pain may also be contributing to her elevated blood pressure.   On check of her lipid panel one year ago, her cholesterol was elevated and her CVD risk score was >7.5%. At that time, she declined a statin. This visit, however, she is agreeable to initiating a statin should her cholesterol remain high.   She continues to cope with the death of her mother this past 04-12-2023. 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 She is currently taking 200 mg zoloft daily. Declines counseling.  She is due for her Pneumovax at her upcoming AWV.    Hypertension  This is a chronic problem. The problem is uncontrolled. Pertinent negatives include no anxiety, blurred vision, chest pain, headaches, malaise/fatigue, neck pain, orthopnea, palpitations, peripheral edema, PND, shortness of breath or sweats. There are no associated agents to hypertension. Risk factors for coronary artery disease include dyslipidemia. Past treatments include calcium channel blockers. The current treatment provides mild improvement. There are no compliance problems.        No Known Allergies   Current Outpatient Medications:  .  acetaminophen (TYLENOL) 500 MG tablet, Take 500 mg by mouth every 6 (six) hours as needed for mild pain or moderate pain. , Disp: , Rfl:  .  amLODipine (NORVASC) 5 MG  tablet, Take 1 tablet (5 mg total) by mouth daily., Disp: 90 tablet, Rfl: 0 .  budesonide-formoterol (SYMBICORT) 80-4.5 MCG/ACT inhaler, Inhale 2 puffs into the lungs 2 (two) times daily., Disp: 1 Inhaler, Rfl: 0 .  cholecalciferol (VITAMIN D) 1000 units tablet, Take 1,000 Units by mouth daily., Disp: , Rfl:  .  cyclobenzaprine (FLEXERIL) 5 MG tablet, Take 5 mg by mouth daily as needed. , Disp: , Rfl:  .  fluticasone (FLONASE) 50 MCG/ACT nasal spray, Place 1 spray into both nostrils as needed for allergies or rhinitis., Disp: , Rfl:  .  gabapentin (NEURONTIN) 300 MG capsule, Take 300 mg by mouth daily as needed (neuropathic pain). , Disp: , Rfl:  .  ibuprofen (ADVIL,MOTRIN) 200 MG tablet, Take by mouth., Disp: , Rfl:  .  letrozole (FEMARA) 2.5 MG tablet, Take 1 tablet (2.5 mg total) by mouth daily., Disp: 90 tablet, Rfl: 3 .  meloxicam (MOBIC) 15 MG tablet, TAKE 1 TABLET (15 MG) DAILY, Disp: 90 tablet, Rfl: 1 .  pyridoxine (B-6) 100 MG tablet, Take 100 mg by mouth daily., Disp: , Rfl:  .  traMADol (ULTRAM) 50 MG tablet, Take 50 mg by mouth 4 (four) times daily. , Disp: , Rfl:  .  vitamin B-12 (CYANOCOBALAMIN) 500 MCG tablet, Take 500 mcg by mouth daily., Disp: , Rfl:  .  sertraline (ZOLOFT) 100 MG tablet, Take 2 tablets (200 mg total) by mouth daily., Disp: 180 tablet, Rfl: 0 No current facility-administered medications for this visit.  Facility-Administered Medications Ordered in Other Visits:  .  heparin lock flush 100 unit/mL, 500 Units, Intravenous, Once, Finnegan, Kathlene November, MD .  sodium chloride 0.9 % injection 10 mL, 10 mL, Intravenous, Once, Finnegan, Kathlene November, MD .  sodium chloride flush (NS) 0.9 % injection 10 mL, 10 mL, Intravenous, PRN, [name], MD, 10 mL at 12/24/15 1444  Review of Systems  Constitutional: Negative.  Negative for malaise/fatigue.  Eyes: Negative for blurred vision.  Respiratory: Negative.  Negative for shortness of breath.   Cardiovascular:  Negative.  Negative for chest pain, palpitations, orthopnea and PND.  Gastrointestinal: Negative.   Musculoskeletal: Negative for neck pain.  Neurological: Negative for dizziness, light-headedness and headaches.    Social History   Tobacco Use  . Smoking status: Former Smoker    Packs/day: 0.50    Types: Cigarettes    Last attempt to quit: 07/18/2014    Years since quitting: 3.1  . Smokeless tobacco: Never Used  . Tobacco comment: given smoking and pain information   Substance Use Topics  . Alcohol use: No    Alcohol/week: 0.0 oz   Objective:   BP (!) 146/96 (BP Location: Right Arm, Patient Position: Sitting, Cuff Size: Normal)   Pulse 72   Temp 98.4 F (36.9 C) (Oral)   Resp 16   Wt 200 lb (90.7 kg)   BMI 35.43 kg/m  Vitals:   08/31/17 1102  BP: (!) 146/96  Pulse: 72  Resp: 16  Temp: 98.4 F (36.9 C)  TempSrc: Oral  Weight: 200 lb (90.7 kg)     Physical Exam  Constitutional: She is oriented to person, place, and time. She appears well-developed and well-nourished.  Cardiovascular: Normal rate and regular rhythm.  Pulmonary/Chest: Effort normal and breath sounds normal.  Neurological: She is alert and oriented to person, place, and time.  Skin: Skin is warm and dry.  Psychiatric: Her behavior is normal. She exhibits a depressed mood.        Assessment & Plan:     1. Essential hypertension  Increase amlodipine to 10 mg daily. May take two 5 mg tablets daily until they are finished.   - Comprehensive Metabolic Panel (CMET) - amLODipine (NORVASC) 10 MG tablet; Take 1 tablet (10 mg total) by mouth daily.  Dispense: 90 tablet; Refill: 3  2. Hypercholesteremia  Cholesterol worsened, will start 10 mg Lipitor nightly and see her in one month.  - Lipid Profile  3. Depression, unspecified depression type  Complicated by grief. Continue zoloft.  - sertraline (ZOLOFT) 100 MG tablet; Take 2 tablets (200 mg total) by mouth daily.  Dispense: 180 tablet; Refill:  0  Return in about 1 month (around 09/28/2017) for HTN, AWV.  The entirety of the information documented in the History of Present Illness, Review of Systems and Physical Exam were personally obtained by me. Portions of this information were initially documented by Ashley Royalty, CMA and reviewed by me for thoroughness and accuracy.         Trinna Post, PA-C  [facility]

## 2017-09-01 LAB — COMPREHENSIVE METABOLIC PANEL
ALT: 14 IU/L (ref 0–32)
AST: 19 IU/L (ref 0–40)
Albumin/Globulin Ratio: 1.6 (ref 1.2–2.2)
Albumin: 4.2 g/dL (ref 3.6–4.8)
Alkaline Phosphatase: 104 IU/L (ref 39–117)
BUN/Creatinine Ratio: 12 (ref 12–28)
BUN: 13 mg/dL (ref 8–27)
Bilirubin Total: 0.3 mg/dL (ref 0.0–1.2)
CO2: 23 mmol/L (ref 20–29)
Calcium: 9.7 mg/dL (ref 8.7–10.3)
Chloride: 105 mmol/L (ref 96–106)
Creatinine, Ser: 1.07 mg/dL — ABNORMAL HIGH (ref 0.57–1.00)
GFR calc Af Amer: 63 mL/min/{1.73_m2} (ref >59)
GFR calc non Af Amer: 54 mL/min/{1.73_m2} — ABNORMAL LOW (ref >59)
Globulin, Total: 2.7 g/dL (ref 1.5–4.5)
Glucose: 79 mg/dL (ref 65–99)
Potassium: 4.7 mmol/L (ref 3.5–5.2)
Sodium: 143 mmol/L (ref 134–144)
Total Protein: 6.9 g/dL (ref 6.0–8.5)

## 2017-09-01 LAB — LIPID PANEL
Chol/HDL Ratio: 3.3 ratio (ref 0.0–4.4)
Cholesterol, Total: 231 mg/dL — ABNORMAL HIGH (ref 100–199)
HDL: 69 mg/dL (ref >39)
LDL Calculated: 147 mg/dL — ABNORMAL HIGH (ref 0–99)
Triglycerides: 76 mg/dL (ref 0–149)
VLDL Cholesterol Cal: 15 mg/dL (ref 5–40)

## 2017-09-01 NOTE — Patient Instructions (Signed)

## 2017-09-02 ENCOUNTER — Telehealth: Payer: Self-pay

## 2017-09-02 NOTE — Telephone Encounter (Signed)
-----   Message from Trinna Post, [place] sent at 09/01/2017  1:59 PM EST ----- CMET stable. I have sent in 10 mg Lipitor to be taken nightly as we discussed in clinic. Will follow up with her next office visit.

## 2017-09-02 NOTE — Telephone Encounter (Signed)
[id number] 

## 2017-09-03 NOTE — Telephone Encounter (Signed)
Patient advised.KW 

## 2017-09-20 ENCOUNTER — Emergency Department
Admission: EM | Admit: 2017-09-20 | Discharge: 2017-09-20 | Disposition: A | Discharge status: Home / Self Care | Payer: Medicare HMO | Attending: Emergency Medicine | Admitting: Emergency Medicine

## 2017-09-20 ENCOUNTER — Emergency Department: Payer: Medicare HMO

## 2017-09-20 ENCOUNTER — Other Ambulatory Visit: Payer: Self-pay

## 2017-09-20 DIAGNOSIS — M479 Spondylosis, unspecified: Secondary | ICD-10-CM | POA: Diagnosis not present

## 2017-09-20 DIAGNOSIS — I10 Essential (primary) hypertension: Secondary | ICD-10-CM | POA: Insufficient documentation

## 2017-09-20 DIAGNOSIS — Z79899 Other long term (current) drug therapy: Secondary | ICD-10-CM | POA: Diagnosis not present

## 2017-09-20 DIAGNOSIS — M13861 Other specified arthritis, right knee: Secondary | ICD-10-CM | POA: Diagnosis not present

## 2017-09-20 DIAGNOSIS — M19011 Primary osteoarthritis, right shoulder: Secondary | ICD-10-CM | POA: Diagnosis not present

## 2017-09-20 DIAGNOSIS — Z87891 Personal history of nicotine dependence: Secondary | ICD-10-CM | POA: Diagnosis not present

## 2017-09-20 DIAGNOSIS — R112 Nausea with vomiting, unspecified: Secondary | ICD-10-CM | POA: Insufficient documentation

## 2017-09-20 DIAGNOSIS — R1110 Vomiting, unspecified: Secondary | ICD-10-CM | POA: Diagnosis present

## 2017-09-20 DIAGNOSIS — R197 Diarrhea, unspecified: Secondary | ICD-10-CM | POA: Insufficient documentation

## 2017-09-20 DIAGNOSIS — R109 Unspecified abdominal pain: Secondary | ICD-10-CM | POA: Insufficient documentation

## 2017-09-20 DIAGNOSIS — Z5181 Encounter for therapeutic drug level monitoring: Secondary | ICD-10-CM | POA: Diagnosis not present

## 2017-09-20 DIAGNOSIS — R1084 Generalized abdominal pain: Secondary | ICD-10-CM | POA: Diagnosis not present

## 2017-09-20 LAB — CBC
HCT: 42.6 % (ref 35.0–47.0)
Hemoglobin: 14.1 g/dL (ref 12.0–16.0)
MCH: 27.4 pg (ref 26.0–34.0)
MCHC: 33.1 g/dL (ref 32.0–36.0)
MCV: 82.8 fL (ref 80.0–100.0)
Platelets: 215 10*3/uL (ref 150–440)
RBC: 5.15 MIL/uL (ref 3.80–5.20)
RDW: 14.7 % — ABNORMAL HIGH (ref 11.5–14.5)
WBC: 15.5 10*3/uL — ABNORMAL HIGH (ref 3.6–11.0)

## 2017-09-20 LAB — COMPREHENSIVE METABOLIC PANEL
ALT: 16 U/L (ref 14–54)
AST: 29 U/L (ref 15–41)
Albumin: 4.5 g/dL (ref 3.5–5.0)
Alkaline Phosphatase: 89 U/L (ref 38–126)
Anion gap: 12 (ref 5–15)
BUN: 24 mg/dL — ABNORMAL HIGH (ref 6–20)
CO2: 20 mmol/L — ABNORMAL LOW (ref 22–32)
Calcium: 9.9 mg/dL (ref 8.9–10.3)
Chloride: 106 mmol/L (ref 101–111)
Creatinine, Ser: 1.05 mg/dL — ABNORMAL HIGH (ref 0.44–1.00)
GFR calc Af Amer: >60 mL/min (ref >60)
GFR calc non Af Amer: 54 mL/min — ABNORMAL LOW (ref >60)
Glucose, Bld: 115 mg/dL — ABNORMAL HIGH (ref 65–99)
Potassium: 4.1 mmol/L (ref 3.5–5.1)
Sodium: 138 mmol/L (ref 135–145)
Total Bilirubin: 1 mg/dL (ref 0.3–1.2)
Total Protein: 8.5 g/dL — ABNORMAL HIGH (ref 6.5–8.1)

## 2017-09-20 LAB — URINALYSIS, COMPLETE (UACMP) WITH MICROSCOPIC
Bacteria, UA: NONE SEEN
Bilirubin Urine: NEGATIVE
Glucose, UA: NEGATIVE mg/dL
Hgb urine dipstick: NEGATIVE
Ketones, ur: NEGATIVE mg/dL
Leukocytes, UA: NEGATIVE
Nitrite: NEGATIVE
Protein, ur: NEGATIVE mg/dL
Specific Gravity, Urine: 1.023 (ref 1.005–1.030)
pH: 5 (ref 5.0–8.0)

## 2017-09-20 LAB — LIPASE, BLOOD: Lipase: 24 U/L (ref 11–51)

## 2017-09-20 MED ORDER — MORPHINE SULFATE (PF) 4 MG/ML IV SOLN
4.0000 mg | Freq: Once | INTRAVENOUS | Status: AC
  Administered 2017-09-20: 4 mg via INTRAVENOUS

## 2017-09-20 MED ORDER — ONDANSETRON HCL 4 MG/2ML IJ SOLN
INTRAMUSCULAR | Status: AC
  Administered 2017-09-20: 4 mg via INTRAVENOUS
  Filled 2017-09-20: qty 2

## 2017-09-20 MED ORDER — ONDANSETRON HCL 4 MG PO TABS: 4.0000 mg | ORAL_TABLET | Freq: Three times a day (TID) | ORAL | 0 refills | Status: DC | PRN

## 2017-09-20 MED ORDER — IOPAMIDOL (ISOVUE-300) INJECTION 61%
100.0000 mL | Freq: Once | INTRAVENOUS | Status: AC | PRN
  Administered 2017-09-20: 100 mL via INTRAVENOUS
  Filled 2017-09-20: qty 100

## 2017-09-20 MED ORDER — FENTANYL CITRATE (PF) 100 MCG/2ML IJ SOLN: 50.0000 ug | Freq: Once | INTRAMUSCULAR | Status: DC

## 2017-09-20 MED ORDER — SODIUM CHLORIDE 0.9 % IV BOLUS (SEPSIS)
1000.0000 mL | Freq: Once | INTRAVENOUS | Status: AC
  Administered 2017-09-20: 1000 mL via INTRAVENOUS

## 2017-09-20 MED ORDER — MORPHINE SULFATE (PF) 4 MG/ML IV SOLN
INTRAVENOUS | Status: AC
  Administered 2017-09-20: 4 mg via INTRAVENOUS
  Filled 2017-09-20: qty 1

## 2017-09-20 MED ORDER — ONDANSETRON 4 MG PO TBDP
4.0000 mg | ORAL_TABLET | Freq: Once | ORAL | Status: AC | PRN
  Administered 2017-09-20: 4 mg via ORAL
  Filled 2017-09-20: qty 1

## 2017-09-20 MED ORDER — ONDANSETRON HCL 4 MG/2ML IJ SOLN
4.0000 mg | Freq: Once | INTRAMUSCULAR | Status: AC
  Administered 2017-09-20: 4 mg via INTRAVENOUS

## 2017-09-20 MED ORDER — TRAMADOL HCL 50 MG PO TABS
50.0000 mg | ORAL_TABLET | Freq: Once | ORAL | Status: AC
  Administered 2017-09-20: 50 mg via ORAL
  Filled 2017-09-20: qty 1

## 2017-09-20 MED ORDER — IOPAMIDOL (ISOVUE-300) INJECTION 61%
30.0000 mL | Freq: Once | INTRAVENOUS | Status: AC | PRN
  Administered 2017-09-20: 30 mL via ORAL
  Filled 2017-09-20: qty 30

## 2017-09-20 NOTE — ED Notes (Signed)
Error in charting. Pt did not come into ED today for a head injury. Pt came into ED today for vomiting and diarrhea.

## 2017-09-20 NOTE — ED Notes (Signed)
Pt wheeled to treatment room. States abd and back pain. N&V. Diarrhea as well. Denies fevers today. States symptoms began this AM. Alert and oriented.

## 2017-09-20 NOTE — ED Triage Notes (Signed)
Pt c/o generalized abd cramping with N/V/D that started today.Marland Kitchen

## 2017-09-20 NOTE — ED Notes (Signed)
Pt taken to CT.

## 2017-09-20 NOTE — ED Provider Notes (Addendum)
[facility] Emergency Department Provider Note  ____________________________________________   I have reviewed the triage vital signs and the nursing notes. Where available I have reviewed prior notes and, if possible and indicated, outside hospital notes.    HISTORY  Chief Complaint Emesis and Diarrhea    HPI Carrie Alvarez is a 67 y.o. female presents with nausea vomiting diarrhea and abdominal pain, started yesterday this morning.  Patient is a chronic pain patient, has not been able to take her narcotics today because she has been throwing up.  No melena no bright red blood per rectum no hematemesis.  No fevers no chills.  The pain is diffuse in the lower abdomen is a cramping discomfort, waxes and wanes and seem to be relieved by diarrhea, no radiation mostly on the left side.  Nothing makes it otherwise better or worse, she has not taken anything for the pain at home.  She is worried because she is missing her home narcotics.    Past Medical History:  Diagnosis Date  . Allergy   . Anemia   . Back pain   . Breast cancer ([place]) 2015   LT LUMPECTOMY 12-2014 FOLLOWING CHEMO  . Carpal tunnel syndrome   . Depression   . GERD (gastroesophageal reflux disease)   . Hypertension   . Obesity   . Uterine cancer [facility]) 1994    Patient Active Problem List   Diagnosis Date Noted  . Acute cholecystitis   . Transaminitis   . Cholangitis 04/18/2016  . Allergic rhinitis 09/04/2015  . AA (alopecia areata) 09/04/2015  . Arthritis, degenerative 09/04/2015  . Cancer of uterus ([place]) 09/04/2015  . Avitaminosis D 09/04/2015  . Benign positional vertigo 09/04/2015  . Hypertension 03/15/2015  . DDD (degenerative disc disease), lumbar 03/07/2015  . Dizziness 12/25/2014  . Lumbosacral facet joint syndrome 12/10/2014  . Sacroiliac joint dysfunction 12/10/2014  . DDD (degenerative disc disease), lumbosacral 11/13/2014  . Clinical depression 10/13/2014  . Breast  cancer metastasized to axillary lymph node ([place]) 06/21/2014  . Hypercholesteremia 08/30/2009  . Compulsive tobacco user syndrome 04/21/2008  . Adaptation reaction 07/18/2007    Past Surgical History:  Procedure Laterality Date  . ABDOMINAL HYSTERECTOMY  1994   UTERINE CA  . AXILLARY LYMPH NODE BIOPSY Left 12/18/2014   Procedure: AXILLARY LYMPH NODE BIOPSY/;  Surgeon: Robert Bellow, MD;  Location: [facility] ORS;  Service: General;  Laterality: Left;  . AXILLARY LYMPH NODE DISSECTION Left 12/18/2014   Procedure: AXILLARY LYMPH NODE DISSECTION;  Surgeon: Robert Bellow, MD;  Location: [facility] ORS;  Service: General;  Laterality: Left;  . BREAST BIOPSY Left 05/2014   CORE - POS  . BREAST SURGERY    . CHOLECYSTECTOMY N/A 04/19/2016   Procedure: LAPAROSCOPIC CHOLECYSTECTOMY WITH INTRAOPERATIVE CHOLANGIOGRAM;  Surgeon: Hubbard Robinson, MD;  Location: [facility] ORS;  Service: General;  Laterality: N/A;  . medial branch block  11/06/2015   lumbar facet Dr. Primus Bravo  . SENTINEL NODE BIOPSY Left 12/18/2014   Procedure: SENTINEL NODE BIOPSY;  Surgeon: Robert Bellow, MD;  Location: [facility] ORS;  Service: General;  Laterality: Left;    Prior to Admission medications   Medication Sig Start Date End Date Taking? Authorizing Provider  acetaminophen (TYLENOL) 500 MG tablet Take 500 mg by mouth every 6 (six) hours as needed for mild pain or moderate pain.     [provider]  amLODipine (NORVASC) 10 MG tablet Take 1 tablet (10 mg total) by mouth daily. 08/31/17  Carles Collet M, PA-C  budesonide-formoterol (SYMBICORT) 80-4.5 MCG/ACT inhaler Inhale 2 puffs into the lungs 2 (two) times daily. 06/07/17   Trinna Post, PA-C  cholecalciferol (VITAMIN D) 1000 units tablet Take 1,000 Units by mouth daily.    [provider]  cyclobenzaprine (FLEXERIL) 5 MG tablet Take 5 mg by mouth daily as needed.     [provider]  fluticasone (FLONASE) 50 MCG/ACT nasal spray Place 1 spray into both  nostrils as needed for allergies or rhinitis.    [provider]  gabapentin (NEURONTIN) 300 MG capsule Take 300 mg by mouth daily as needed (neuropathic pain).     [provider]  ibuprofen (ADVIL,MOTRIN) 200 MG tablet Take by mouth.    [provider]  letrozole (FEMARA) 2.5 MG tablet Take 1 tablet (2.5 mg total) by mouth daily. 06/29/17   [name], MD  meloxicam (MOBIC) 15 MG tablet TAKE 1 TABLET (15 MG) DAILY 04/21/17   Mar Daring, PA-C  pyridoxine (B-6) 100 MG tablet Take 100 mg by mouth daily.    [provider]  sertraline (ZOLOFT) 100 MG tablet Take 2 tablets (200 mg total) by mouth daily. 08/31/17 11/29/17  Trinna Post, PA-C  traMADol (ULTRAM) 50 MG tablet Take 50 mg by mouth 4 (four) times daily.  12/01/16   [provider]  vitamin B-12 (CYANOCOBALAMIN) 500 MCG tablet Take 500 mcg by mouth daily.    [provider]    Allergies Patient has no known allergies.  Family History  Problem Relation Age of Onset  . Breast cancer Sister 27  . Colon cancer Mother   . Hypertension Mother   . Asthma Mother   . Cancer Father     Social History Social History   Tobacco Use  . Smoking status: Former Smoker    Packs/day: 0.50    Types: Cigarettes    Last attempt to quit: 07/18/2014    Years since quitting: 3.1  . Smokeless tobacco: Never Used  . Tobacco comment: given smoking and pain information   Substance Use Topics  . Alcohol use: No    Alcohol/week: 0.0 oz  . Drug use: No    Review of Systems Constitutional: No fever/chills Eyes: No visual changes. ENT: No sore throat. No stiff neck no neck pain Cardiovascular: Denies chest pain. Respiratory: Denies shortness of breath. Gastrointestinal:   See HPI genitourinary: Negative for dysuria. Musculoskeletal: Negative lower extremity swelling Skin: Negative for rash. Neurological: Negative for severe headaches, focal weakness or  numbness.   ____________________________________________   PHYSICAL EXAM:  VITAL SIGNS: ED Triage Vitals [09/20/17 1850]  Enc Vitals Group     BP (!) 137/101     Pulse Rate 97     Resp 20     Temp 99.1 F (37.3 C)     Temp Source Oral     SpO2 100 %     Weight 200 lb (90.7 kg)     Height      Head Circumference      Peak Flow      Pain Score 9     Pain Loc      Pain Edu?      Excl. in [place]?     Constitutional: Alert and oriented. Well appearing and in no acute distress. Eyes: Conjunctivae are normal Head: Atraumatic HEENT: No congestion/rhinnorhea. Mucous membranes are moist.  Oropharynx non-erythematous Neck:   Nontender with no meningismus, no masses, no stridor Cardiovascular: Normal rate,  regular rhythm. Grossly normal heart sounds.  Good peripheral circulation. Respiratory: Normal respiratory effort.  No retractions. Lungs CTAB. Abdominal: + tender to palp in the left abd diffusely. No g/r soft.  Back:  There is no focal tenderness or step off.  there is no midline tenderness there are no lesions noted. there is no CVA tenderness Musculoskeletal: No lower extremity tenderness, no upper extremity tenderness. No joint effusions, no DVT signs strong distal pulses no edema Neurologic:  Normal speech and language. No gross focal neurologic deficits are appreciated.  Skin:  Skin is warm, dry and intact. No rash noted. Psychiatric: Mood and affect are normal. Speech and behavior are normal.  ____________________________________________   LABS (all labs ordered are listed, but only abnormal results are displayed)  Labs Reviewed  COMPREHENSIVE METABOLIC PANEL - Abnormal; Notable for the following components:      Result Value   CO2 20 (*)    Glucose, Bld 115 (*)    BUN 24 (*)    Creatinine, Ser 1.05 (*)    Total Protein 8.5 (*)    GFR calc non Af Amer 54 (*)    All other components within normal limits  CBC - Abnormal; Notable for the following components:   WBC  15.5 (*)    RDW 14.7 (*)    All other components within normal limits  URINALYSIS, COMPLETE (UACMP) WITH MICROSCOPIC - Abnormal; Notable for the following components:   Color, Urine YELLOW (*)    APPearance HAZY (*)    Squamous Epithelial / LPF 0-5 (*)    All other components within normal limits  LIPASE, BLOOD    Pertinent labs  results that were available during my care of the patient were reviewed by me and considered in my medical decision making (see chart for details). ____________________________________________  EKG  I personally interpreted any EKGs ordered by me or triage  ____________________________________________  RADIOLOGY  Pertinent labs & imaging results that were available during my care of the patient were reviewed by me and considered in my medical decision making (see chart for details). If possible, patient and/or family made aware of any abnormal findings.  No results found. ____________________________________________    PROCEDURES  Procedure(s) performed: None  Procedures  Critical Care performed: None  ____________________________________________   INITIAL IMPRESSION / ASSESSMENT AND PLAN / ED COURSE  Pertinent labs & imaging results that were available during my care of the patient were reviewed by me and considered in my medical decision making (see chart for details).   pt here with  N/v/d and abd pain.  She has a clinical picture that is somewhat compounded by the fact that she is without her narcotic pain medication.  Given her age, and the fact that she has left-sided abdominal discomfort we will obtain a CT scan to rule out diverticular disease most likely however this is consistent with a viral illness.  Blood work and vital signs are reassuring, no evidence of significant dehydration medically, we are giving IV fluids.  CT scan is pending.  ----------------------------------------- 11:03 PM on  09/20/2017 -----------------------------------------  Tolerating p.o. very well, abdomen is benign on serial exam, feels much better after pain medication a few hours ago, I will give her her home dose of Ultram at her request prior to discharge.  Considering the patient's symptoms, medical history, and physical examination today, I have low suspicion for cholecystitis or biliary pathology, pancreatitis, perforation or bowel obstruction, hernia, intra-abdominal abscess, AAA or dissection, volvulus or intussusception, mesenteric  ischemia, ischemic gut, pyelonephritis or appendicitis.  Return precautions and follow-up given and understood    ____________________________________________   FINAL CLINICAL IMPRESSION(S) / ED DIAGNOSES  Final diagnoses:  None      This chart was dictated using voice recognition software.  Despite best efforts to proofread,  errors can occur which can change meaning.      Schuyler Amor, MD 09/20/17 2214    Schuyler Amor, MD 09/20/17 [phone]

## 2017-09-28 ENCOUNTER — Encounter: Payer: Self-pay | Admitting: Physician Assistant

## 2017-09-28 ENCOUNTER — Ambulatory Visit (INDEPENDENT_AMBULATORY_CARE_PROVIDER_SITE_OTHER): Payer: Medicare HMO

## 2017-09-28 ENCOUNTER — Ambulatory Visit (INDEPENDENT_AMBULATORY_CARE_PROVIDER_SITE_OTHER): Payer: Medicare HMO | Admitting: Physician Assistant

## 2017-09-28 VITALS — BP 126/70 | HR 92 | Temp 98.8°F | Ht 63.0 in

## 2017-09-28 VITALS — BP 126/70 | HR 92 | Temp 98.8°F | Wt 200.0 lb

## 2017-09-28 DIAGNOSIS — Z0000 Encounter for general adult medical examination without abnormal findings: Secondary | ICD-10-CM | POA: Diagnosis not present

## 2017-09-28 DIAGNOSIS — M19012 Primary osteoarthritis, left shoulder: Secondary | ICD-10-CM

## 2017-09-28 DIAGNOSIS — Z23 Encounter for immunization: Secondary | ICD-10-CM | POA: Diagnosis not present

## 2017-09-28 DIAGNOSIS — Z87891 Personal history of nicotine dependence: Secondary | ICD-10-CM

## 2017-09-28 DIAGNOSIS — I10 Essential (primary) hypertension: Secondary | ICD-10-CM

## 2017-09-28 DIAGNOSIS — E7800 Pure hypercholesterolemia, unspecified: Secondary | ICD-10-CM | POA: Diagnosis not present

## 2017-09-28 DIAGNOSIS — R062 Wheezing: Secondary | ICD-10-CM

## 2017-09-28 MED ORDER — ALBUTEROL SULFATE HFA 108 (90 BASE) MCG/ACT IN AERS: 2.0000 | INHALATION_SPRAY | Freq: Four times a day (QID) | RESPIRATORY_TRACT | 2 refills | Status: DC | PRN

## 2017-09-28 MED ORDER — ATORVASTATIN CALCIUM 10 MG PO TABS: 10.0000 mg | ORAL_TABLET | Freq: Every day | ORAL | 3 refills | Status: DC

## 2017-09-28 MED ORDER — BUDESONIDE-FORMOTEROL FUMARATE 80-4.5 MCG/ACT IN AERO: 2.0000 | INHALATION_SPRAY | Freq: Two times a day (BID) | RESPIRATORY_TRACT | 0 refills | Status: DC

## 2017-09-28 NOTE — Progress Notes (Addendum)
Patient: Carrie Alvarez Female    DOB: 1951-05-31   67 y.o.   MRN: [id number] Visit Date: 09/28/2017  Today's Provider: Trinna Post, PA-C   Chief Complaint  Patient presents with  . Hypertension   Subjective:    Carrie Alvarez is a 67 y/o woman presenting today for follow up of HTN. At last visit, her amlodipine was increased from 5 mg QD to 10 mg QD. He BP has responded well to this. She denies any side effects.  She denies any ankle swelling.   She is seeing [name] for pain management and left shoulder pain. She reports this clinician wants her PCP to order the imaging for her left shoulder.   She has hypercholesterolemia and is willing to take statin, agreeable to 10 mg Lipitor daily. This was discussed last office visit but medication not ordered so patient thought she wasn't supposed to be on it.    Hypertension  This is a chronic problem. The problem is controlled. Pertinent negatives include no anxiety, blurred vision, chest pain, headaches, malaise/fatigue, neck pain, orthopnea, palpitations, peripheral edema, PND, shortness of breath or sweats. There are no associated agents to hypertension. Past treatments include calcium channel blockers. The current treatment provides moderate improvement. There are no compliance problems.        No Known Allergies   Current Outpatient Medications:  .  acetaminophen (TYLENOL) 500 MG tablet, Take 500 mg by mouth every 6 (six) hours as needed for mild pain or moderate pain. , Disp: , Rfl:  .  amLODipine (NORVASC) 10 MG tablet, Take 1 tablet (10 mg total) by mouth daily., Disp: 90 tablet, Rfl: 3 .  budesonide-formoterol (SYMBICORT) 80-4.5 MCG/ACT inhaler, Inhale 2 puffs into the lungs 2 (two) times daily., Disp: 1 Inhaler, Rfl: 0 .  cholecalciferol (VITAMIN D) 1000 units tablet, Take 1,000 Units by mouth daily., Disp: , Rfl:  .  cyclobenzaprine (FLEXERIL) 5 MG tablet, Take 5 mg by mouth daily as needed. ,  Disp: , Rfl:  .  fluticasone (FLONASE) 50 MCG/ACT nasal spray, Place 1 spray into both nostrils as needed for allergies or rhinitis., Disp: , Rfl:  .  gabapentin (NEURONTIN) 300 MG capsule, Take 300 mg by mouth daily as needed (neuropathic pain). , Disp: , Rfl:  .  ibuprofen (ADVIL,MOTRIN) 200 MG tablet, Take 200 mg by mouth every 4 (four) hours as needed. , Disp: , Rfl:  .  letrozole (FEMARA) 2.5 MG tablet, Take 1 tablet (2.5 mg total) by mouth daily., Disp: 90 tablet, Rfl: 3 .  meloxicam (MOBIC) 15 MG tablet, TAKE 1 TABLET (15 MG) DAILY, Disp: 90 tablet, Rfl: 1 .  ondansetron (ZOFRAN) 4 MG tablet, Take 1 tablet (4 mg total) by mouth every 8 (eight) hours as needed for nausea or vomiting., Disp: 8 tablet, Rfl: 0 .  pyridoxine (B-6) 100 MG tablet, Take 100 mg by mouth daily., Disp: , Rfl:  .  sertraline (ZOLOFT) 100 MG tablet, Take 2 tablets (200 mg total) by mouth daily., Disp: 180 tablet, Rfl: 0 .  traMADol (ULTRAM) 50 MG tablet, Take 50 mg by mouth 4 (four) times daily. , Disp: , Rfl:  .  vitamin B-12 (CYANOCOBALAMIN) 500 MCG tablet, Take 500 mcg by mouth daily., Disp: , Rfl:  .  albuterol (PROVENTIL HFA;VENTOLIN HFA) 108 (90 Base) MCG/ACT inhaler, Inhale 2 puffs into the lungs every 6 (six) hours as needed for wheezing or shortness of breath., Disp: 1 Inhaler, Rfl:  2 .  atorvastatin (LIPITOR) 10 MG tablet, Take 1 tablet (10 mg total) by mouth daily., Disp: 90 tablet, Rfl: 3 No current facility-administered medications for this visit.   Facility-Administered Medications Ordered in Other Visits:  .  heparin lock flush 100 unit/mL, 500 Units, Intravenous, Once, Finnegan, Kathlene November, MD .  sodium chloride 0.9 % injection 10 mL, 10 mL, Intravenous, Once, Finnegan, Kathlene November, MD .  sodium chloride flush (NS) 0.9 % injection 10 mL, 10 mL, Intravenous, PRN, [name], MD, 10 mL at 12/24/15 1444  Review of Systems  Constitutional: Negative.  Negative for malaise/fatigue.  Eyes: Negative for  blurred vision.  Respiratory: Negative.  Negative for shortness of breath.   Cardiovascular: Negative.  Negative for chest pain, palpitations, orthopnea and PND.  Gastrointestinal: Negative.   Musculoskeletal: Negative for neck pain.  Neurological: Negative for dizziness, light-headedness and headaches.    Social History   Tobacco Use  . Smoking status: Former Smoker    Packs/day: 0.50    Types: Cigarettes    Last attempt to quit: 07/18/2014    Years since quitting: 3.2  . Smokeless tobacco: Never Used  . Tobacco comment: given smoking and pain information   Substance Use Topics  . Alcohol use: No    Alcohol/week: 0.0 oz   Objective:   BP 126/70   Pulse 92   Temp 98.8 F (37.1 C) (Oral)   Wt 200 lb (90.7 kg)   BMI 35.43 kg/m  Vitals:   09/28/17 1012  BP: 126/70  Pulse: 92  Temp: 98.8 F (37.1 C)  TempSrc: Oral  Weight: 200 lb (90.7 kg)     Physical Exam  Constitutional: She is oriented to person, place, and time. She appears well-developed and well-nourished.  Cardiovascular: Normal rate and regular rhythm.  Pulmonary/Chest: Effort normal and breath sounds normal.  Neurological: She is alert and oriented to person, place, and time.  Skin: Skin is warm and dry.  Psychiatric: She has a normal mood and affect. Her behavior is normal.        Assessment & Plan:     1. Essential hypertension  Improved today on 10 mg amlodipine, continue medication.  2. Hypercholesteremia  Start lipitor, recheck lipid panel at 6 week follow up.   - atorvastatin (LIPITOR) 10 MG tablet; Take 1 tablet (10 mg total) by mouth daily.  Dispense: 90 tablet; Refill: 3  3. Wheezing  She has been on symbicort remotely for acute bronchitis. She has a history of smoking but reports she had her lungs tested and it did not show COPD. She does not take Symbicort daily nor does she feel the need to. She reports occasional SOB with physical activity, then she takes Symbicort. Counseled that  symbicort is a daily maintenance inhaler, will send her albuterol for PRN use.  - albuterol (PROVENTIL HFA;VENTOLIN HFA) 108 (90 Base) MCG/ACT inhaler; Inhale 2 puffs into the lungs every 6 (six) hours as needed for wheezing or shortness of breath.  Dispense: 1 Inhaler; Refill: 2  4. History of tobacco abuse  Do not think she needs to be on Symbicort.  5. Osteoarthritis of left shoulder, unspecified osteoarthritis type  Previously patient reports her pain management doctor wanted me to get her imaging. However, have called Dr. Ethel Rana clinic and clarified that she is not treated for her left shoulder at the clinic and he wanted her PCP to discuss further workup of this separate issue. Will readdress at follow up.   Return in  about 6 weeks (around 11/09/2017) for HTN, HLD.  The entirety of the information documented in the History of Present Illness, Review of Systems and Physical Exam were personally obtained by me. Portions of this information were initially documented by Ashley Royalty, CMA and reviewed by me for thoroughness and accuracy.          Trinna Post, PA-C  [facility]

## 2017-09-28 NOTE — Patient Instructions (Addendum)
Carrie Alvarez , Thank you for taking time to come for your Medicare Wellness Visit. I appreciate your ongoing commitment to your health goals. Please review the following plan we discussed and let me know if I can assist you in the future.   Screening recommendations/referrals: Colonoscopy: Up to date Mammogram: Up to date Bone Density: Up to date Recommended yearly ophthalmology/optometry visit for glaucoma screening and checkup Recommended yearly dental visit for hygiene and checkup  Vaccinations: Influenza vaccine: Up to date Pneumococcal vaccine: Up to date Tdap vaccine: Pt declines today.  Shingles vaccine: Pt declines today.     Advanced directives: Advance directive discussed with you today. I have provided a copy for you to complete at home and have notarized. Once this is complete please bring a copy in to our office so we can scan it into your chart.  Conditions/risks identified: Recommend increasing water intake to 6-8 glasses a day.  Next appointment: 10:00 AM today with Carles Collet.   Preventive Care 67 Years and Older, Female Preventive care refers to lifestyle choices and visits with your health care provider that can promote health and wellness. What does preventive care include?  A yearly physical exam. This is also called an annual well check.  Dental exams once or twice a year.  Routine eye exams. Ask your health care provider how often you should have your eyes checked.  Personal lifestyle choices, including:  Daily care of your teeth and gums.  Regular physical activity.  Eating a healthy diet.  Avoiding tobacco and drug use.  Limiting alcohol use.  Practicing safe sex.  Taking low-dose aspirin every day.  Taking vitamin and mineral supplements as recommended by your health care provider. What happens during an annual well check? The services and screenings done by your health care provider during your annual well check will depend on your  age, overall health, lifestyle risk factors, and family history of disease. Counseling  Your health care provider may ask you questions about your:  Alcohol use.  Tobacco use.  Drug use.  Emotional well-being.  Home and relationship well-being.  Sexual activity.  Eating habits.  History of falls.  Memory and ability to understand (cognition).  Work and work Statistician.  Reproductive health. Screening  You may have the following tests or measurements:  Height, weight, and BMI.  Blood pressure.  Lipid and cholesterol levels. These may be checked every 5 years, or more frequently if you are over 48 years old.  Skin check.  Lung cancer screening. You may have this screening every year starting at age 67 if you have a 30-pack-year history of smoking and currently smoke or have quit within the past 15 years.  Fecal occult blood test (FOBT) of the stool. You may have this test every year starting at age 67.  Flexible sigmoidoscopy or colonoscopy. You may have a sigmoidoscopy every 5 years or a colonoscopy every 10 years starting at age 67.  Hepatitis C blood test.  Hepatitis B blood test.  Sexually transmitted disease (STD) testing.  Diabetes screening. This is done by checking your blood sugar (glucose) after you have not eaten for a while (fasting). You may have this done every 1-3 years.  Bone density scan. This is done to screen for osteoporosis. You may have this done starting at age 67.  Mammogram. This may be done every 1-2 years. Talk to your health care provider about how often you should have regular mammograms. Talk with your health care provider about  your test results, treatment options, and if necessary, the need for more tests. Vaccines  Your health care provider may recommend certain vaccines, such as:  Influenza vaccine. This is recommended every year.  Tetanus, diphtheria, and acellular pertussis (Tdap, Td) vaccine. You may need a Td booster  every 10 years.  Zoster vaccine. You may need this after age 18.  Pneumococcal 13-valent conjugate (PCV13) vaccine. One dose is recommended after age 28.  Pneumococcal polysaccharide (PPSV23) vaccine. One dose is recommended after age 67. Talk to your health care provider about which screenings and vaccines you need and how often you need them. This information is not intended to replace advice given to you by your health care provider. Make sure you discuss any questions you have with your health care provider. Document Released: 07/26/2015 Document Revised: 03/18/2016 Document Reviewed: 04/30/2015 Elsevier Interactive Patient Education  2017 [place] Prevention in the Home Falls can cause injuries. They can happen to people of all ages. There are many things you can do to make your home safe and to help prevent falls. What can I do on the outside of my home?  Regularly fix the edges of walkways and driveways and fix any cracks.  Remove anything that might make you trip as you walk through a door, such as a raised step or threshold.  Trim any bushes or trees on the path to your home.  Use bright outdoor lighting.  Clear any walking paths of anything that might make someone trip, such as rocks or tools.  Regularly check to see if handrails are loose or broken. Make sure that both sides of any steps have handrails.  Any raised decks and porches should have guardrails on the edges.  Have any leaves, snow, or ice cleared regularly.  Use sand or salt on walking paths during winter.  Clean up any spills in your garage right away. This includes oil or grease spills. What can I do in the bathroom?  Use night lights.  Install grab bars by the toilet and in the tub and shower. Do not use towel bars as grab bars.  Use non-skid mats or decals in the tub or shower.  If you need to sit down in the shower, use a plastic, non-slip stool.  Keep the floor dry. Clean up any  water that spills on the floor as soon as it happens.  Remove soap buildup in the tub or shower regularly.  Attach bath mats securely with double-sided non-slip rug tape.  Do not have throw rugs and other things on the floor that can make you trip. What can I do in the bedroom?  Use night lights.  Make sure that you have a light by your bed that is easy to reach.  Do not use any sheets or blankets that are too big for your bed. They should not hang down onto the floor.  Have a firm chair that has side arms. You can use this for support while you get dressed.  Do not have throw rugs and other things on the floor that can make you trip. What can I do in the kitchen?  Clean up any spills right away.  Avoid walking on wet floors.  Keep items that you use a lot in easy-to-reach places.  If you need to reach something above you, use a strong step stool that has a grab bar.  Keep electrical cords out of the way.  Do not use floor polish or wax  that makes floors slippery. If you must use wax, use non-skid floor wax.  Do not have throw rugs and other things on the floor that can make you trip. What can I do with my stairs?  Do not leave any items on the stairs.  Make sure that there are handrails on both sides of the stairs and use them. Fix handrails that are broken or loose. Make sure that handrails are as long as the stairways.  Check any carpeting to make sure that it is firmly attached to the stairs. Fix any carpet that is loose or worn.  Avoid having throw rugs at the top or bottom of the stairs. If you do have throw rugs, attach them to the floor with carpet tape.  Make sure that you have a light switch at the top of the stairs and the bottom of the stairs. If you do not have them, ask someone to add them for you. What else can I do to help prevent falls?  Wear shoes that:  Do not have high heels.  Have rubber bottoms.  Are comfortable and fit you well.  Are closed  at the toe. Do not wear sandals.  If you use a stepladder:  Make sure that it is fully opened. Do not climb a closed stepladder.  Make sure that both sides of the stepladder are locked into place.  Ask someone to hold it for you, if possible.  Clearly mark and make sure that you can see:  Any grab bars or handrails.  First and last steps.  Where the edge of each step is.  Use tools that help you move around (mobility aids) if they are needed. These include:  Canes.  Walkers.  Scooters.  Crutches.  Turn on the lights when you go into a dark area. Replace any light bulbs as soon as they burn out.  Set up your furniture so you have a clear path. Avoid moving your furniture around.  If any of your floors are uneven, fix them.  If there are any pets around you, be aware of where they are.  Review your medicines with your doctor. Some medicines can make you feel dizzy. This can increase your chance of falling. Ask your doctor what other things that you can do to help prevent falls. This information is not intended to replace advice given to you by your health care provider. Make sure you discuss any questions you have with your health care provider. Document Released: 04/25/2009 Document Revised: 12/05/2015 Document Reviewed: 08/03/2014 Elsevier Interactive Patient Education  2017 [employer].

## 2017-09-28 NOTE — Patient Instructions (Signed)

## 2017-09-28 NOTE — Progress Notes (Signed)
Subjective:   Carrie Alvarez is a 67 y.o. female who presents for Medicare Annual (Subsequent) preventive examination.  Review of Systems:  N/A  Cardiac Risk Factors include: advanced age (>70men, >60 women);dyslipidemia;hypertension;obesity (BMI >30kg/m2)     Objective:     Vitals: BP 126/70 (BP Location: Right Arm)   Pulse 92   Temp 98.8 F (37.1 C) (Oral)   Ht 5\' 3"  (1.6 m)   BMI 35.43 kg/m   Body mass index is 35.43 kg/m.  Advanced Directives 09/28/2017 09/20/2017 06/29/2017 12/29/2016 09/22/2016 06/30/2016 04/18/2016  Does Patient Have a Medical Advance Directive? No No No No No No No  Would patient like information on creating a medical advance directive? Yes (MAU/Ambulatory/Procedural Areas - Information given) No - Patient declined - No - Patient declined No - Patient declined - No - patient declined information    Tobacco Social History   Tobacco Use  Smoking Status Former Smoker  . Packs/day: 0.50  . Types: Cigarettes  . Last attempt to quit: 07/18/2014  . Years since quitting: 3.2  Smokeless Tobacco Never Used  Tobacco Comment   given smoking and pain information      Counseling given: Not Answered Comment: given smoking and pain information    Clinical Intake:  Pre-visit preparation completed: Yes  Pain : 0-10 Pain Score: 4  Pain Type: Chronic pain Pain Location: Shoulder Pain Orientation: Left Pain Descriptors / Indicators: Aching, Sore, Shooting Pain Frequency: Constant     Nutritional Status: BMI > 30  Obese Nutritional Risks: None Diabetes: No  How often do you need to have someone help you when you read instructions, pamphlets, or other written materials from your doctor or pharmacy?: 1 - Never  Interpreter Needed?: No  Information entered by :: [facility], LPN  Past Medical History:  Diagnosis Date  . Allergy   . Anemia   . Back pain   . Breast cancer ([place]) 2015   LT LUMPECTOMY 12-2014 FOLLOWING CHEMO  . Carpal tunnel  syndrome   . Depression   . GERD (gastroesophageal reflux disease)   . Hypertension   . Obesity   . Uterine cancer ([place]) 1994   Past Surgical History:  Procedure Laterality Date  . ABDOMINAL HYSTERECTOMY  1994   UTERINE CA  . AXILLARY LYMPH NODE BIOPSY Left 12/18/2014   Procedure: AXILLARY LYMPH NODE BIOPSY/;  Surgeon: Robert Bellow, MD;  Location: [facility] ORS;  Service: General;  Laterality: Left;  . AXILLARY LYMPH NODE DISSECTION Left 12/18/2014   Procedure: AXILLARY LYMPH NODE DISSECTION;  Surgeon: Robert Bellow, MD;  Location: [facility] ORS;  Service: General;  Laterality: Left;  . BREAST BIOPSY Left 05/2014   CORE - POS  . BREAST SURGERY    . CHOLECYSTECTOMY N/A 04/19/2016   Procedure: LAPAROSCOPIC CHOLECYSTECTOMY WITH INTRAOPERATIVE CHOLANGIOGRAM;  Surgeon: Hubbard Robinson, MD;  Location: [facility] ORS;  Service: General;  Laterality: N/A;  . medial branch block  11/06/2015   lumbar facet Dr. Primus Bravo  . SENTINEL NODE BIOPSY Left 12/18/2014   Procedure: SENTINEL NODE BIOPSY;  Surgeon: Robert Bellow, MD;  Location: [facility] ORS;  Service: General;  Laterality: Left;   Family History  Problem Relation Age of Onset  . Breast cancer Sister 53  . Colon cancer Sister   . Colon cancer Mother   . Hypertension Mother   . Asthma Mother   . Cancer Father    Social History   Socioeconomic History  . Marital status: Married    Spouse  name: None  . Number of children: 3  . Years of education: None  . Highest education level: Some college, no degree  Social Needs  . Financial resource strain: Not hard at all  . Food insecurity - worry: Never true  . Food insecurity - inability: Never true  . Transportation needs - medical: No  . Transportation needs - non-medical: No  Occupational History  . 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 Smoking status: Former Smoker    Packs/day: 0.50    Types: Cigarettes    Last attempt to quit: 07/18/2014    Years  since quitting: 3.2  . Smokeless tobacco: Never Used  . Tobacco comment: given smoking and pain information   Substance and Sexual Activity  . Alcohol use: No    Alcohol/week: 0.0 oz  . Drug use: No  . Sexual activity: None  Other Topics Concern  . None  Social History Narrative  . None    Outpatient Encounter Medications as of 09/28/2017  Medication Sig  . acetaminophen (TYLENOL) 500 MG tablet Take 500 mg by mouth every 6 (six) hours as needed for mild pain or moderate pain.   Marland Kitchen amLODipine (NORVASC) 10 MG tablet Take 1 tablet (10 mg total) by mouth daily.  . budesonide-formoterol (SYMBICORT) 80-4.5 MCG/ACT inhaler Inhale 2 puffs into the lungs 2 (two) times daily.  . cholecalciferol (VITAMIN D) 1000 units tablet Take 1,000 Units by mouth daily.  . fluticasone (FLONASE) 50 MCG/ACT nasal spray Place 1 spray into both nostrils as needed for allergies or rhinitis.  Marland Kitchen gabapentin (NEURONTIN) 300 MG capsule Take 300 mg by mouth daily as needed (neuropathic pain).   Marland Kitchen ibuprofen (ADVIL,MOTRIN) 200 MG tablet Take 200 mg by mouth every 4 (four) hours as needed.   Marland Kitchen letrozole (FEMARA) 2.5 MG tablet Take 1 tablet (2.5 mg total) by mouth daily.  . meloxicam (MOBIC) 15 MG tablet TAKE 1 TABLET (15 MG) DAILY  . pyridoxine (B-6) 100 MG tablet Take 100 mg by mouth daily.  . sertraline (ZOLOFT) 100 MG tablet Take 2 tablets (200 mg total) by mouth daily.  . traMADol (ULTRAM) 50 MG tablet Take 50 mg by mouth 4 (four) times daily.   . vitamin B-12 (CYANOCOBALAMIN) 500 MCG tablet Take 500 mcg by mouth daily.  . cyclobenzaprine (FLEXERIL) 5 MG tablet Take 5 mg by mouth daily as needed.   . ondansetron (ZOFRAN) 4 MG tablet Take 1 tablet (4 mg total) by mouth every 8 (eight) hours as needed for nausea or vomiting. (Patient not taking: Reported on 09/28/2017)   Facility-Administered Encounter Medications as of 09/28/2017  Medication  . heparin lock flush 100 unit/mL  . sodium chloride 0.9 % injection 10 mL  .  sodium chloride flush (NS) 0.9 % injection 10 mL    Activities of Daily Living In your present state of health, do you have any difficulty performing the following activities: 09/28/2017  Hearing? N  Vision? N  Difficulty concentrating or making decisions? N  Walking or climbing stairs? N  Dressing or bathing? N  Doing errands, shopping? N  Preparing Food and eating ? N  Using the Toilet? N  In the past six months, have you accidently leaked urine? N  Do you have problems with loss of bowel control? N  Managing your Medications? N  Managing your Finances? N  Housekeeping or managing your Housekeeping? N  Some recent data might be hidden  Patient Care Team: Paulene Floor as PCP - General (Physician Assistant) [name], MD as Consulting Physician (Oncology) [name], MD as Attending Physician (Pain Medicine) [name]., MD as Consulting Physician (Ophthalmology)    Assessment:   This is a routine wellness examination for Meia.  Exercise Activities and Dietary recommendations Current Exercise Habits: Home exercise routine;Structured exercise class, Type of exercise: walking, Time (Minutes): 60, Frequency (Times/Week): 5, Weekly Exercise (Minutes/Week): 300, Intensity: Mild, Exercise limited by: orthopedic condition(s)  Goals    . DIET - INCREASE WATER INTAKE     Recommend increasing water intake to 6-8 glasses a day.       Fall Risk Fall Risk  09/28/2017 09/22/2016 03/04/2016 02/24/2016 01/27/2016  Falls in the past year? Yes Yes No No No  Comment - - - - -  Number falls in past yr: 2 or more 1 - - -  Injury with Fall? No No - - -  Risk for fall due to : - - - - -  Follow up Falls prevention discussed Falls prevention discussed - - -   Is the patient's home free of loose throw rugs in walkways, pet beds, electrical cords, etc?   yes      Grab bars in the bathroom? no      Handrails on the stairs?   yes      Adequate lighting?    yes  Timed Get Up and Go performed: N/A  Depression Screen PHQ 2/9 Scores 09/28/2017 10/27/2016 09/22/2016 03/04/2016  PHQ - 2 Score 1 0 2 0  PHQ- 9 Score - - 9 -  Exception Documentation - - - -     Cognitive Function: Pt declined screening today.      6CIT Screen 09/22/2016  What Year? 0 points  What month? 0 points  What time? 0 points  Count back from 20 0 points  Months in reverse 0 points  Repeat phrase 0 points  Total Score 0    Immunization History  Administered Date(s) Administered  . Influenza, High Dose Seasonal PF 06/07/2017  . Influenza,inj,Quad PF,6+ Mos 04/20/2016  . Pneumococcal Conjugate-13 09/22/2016  . Pneumococcal Polysaccharide-23 09/28/2017    Qualifies for Shingles Vaccine? Due for Shingles vaccine. Declined my offer to administer today. Education has been provided regarding the importance of this vaccine. Pt has been advised to call her insurance company to determine her out of pocket expense. Advised she may also receive this vaccine at her local pharmacy or Health Dept. Verbalized acceptance and understanding.  Screening Tests Health Maintenance  Topic Date Due  . TETANUS/TDAP  07/13/2026 (Originally 09/21/1969)  . MAMMOGRAM  06/29/2019  . COLONOSCOPY  04/23/2021  . INFLUENZA VACCINE  Completed  . DEXA SCAN  Completed  . Hepatitis C Screening  Completed  . PNA vac Low Risk Adult  Completed    Cancer Screenings: Lung: Low Dose CT Chest recommended if Age 43-80 years, 30 pack-year currently smoking OR have quit w/in 15years. Patient does qualify, but declines order today.  Breast:  Up to date on Mammogram? Yes   Up to date of Bone Density/Dexa? Yes Colorectal: Up to date  Additional Screenings:  Hepatitis C Screening: Up to date     Plan:  I have personally reviewed and addressed the Medicare Annual Wellness questionnaire and have noted the following in the patient's chart:  A. Medical and social history B. Use of alcohol, tobacco or  illicit drugs  C. Current medications and supplements  D. Functional ability and status E.  Nutritional status F.  Physical activity G. Advance directives H. List of other physicians I.  Hospitalizations, surgeries, and ER visits in previous 12 months J.  [place] such as hearing and vision if needed, cognitive and depression L. Referrals and appointments - none  In addition, I have reviewed and discussed with patient certain preventive protocols, quality metrics, and best practice recommendations. A written personalized care plan for preventive services as well as general preventive health recommendations were provided to patient.  See attached scanned questionnaire for additional information.   Signed,  Fabio Neighbors, LPN Nurse Health Advisor   Nurse Recommendations: Pt declined a tetanus vaccine and a low dose chest CT scan order today.

## 2017-09-30 DIAGNOSIS — Z79811 Long term (current) use of aromatase inhibitors: Secondary | ICD-10-CM | POA: Diagnosis not present

## 2017-09-30 DIAGNOSIS — Z6835 Body mass index (BMI) 35.0-35.9, adult: Secondary | ICD-10-CM | POA: Diagnosis not present

## 2017-09-30 DIAGNOSIS — G8929 Other chronic pain: Secondary | ICD-10-CM | POA: Diagnosis not present

## 2017-09-30 DIAGNOSIS — Z791 Long term (current) use of non-steroidal anti-inflammatories (NSAID): Secondary | ICD-10-CM | POA: Diagnosis not present

## 2017-09-30 DIAGNOSIS — C50919 Malignant neoplasm of unspecified site of unspecified female breast: Secondary | ICD-10-CM | POA: Diagnosis not present

## 2017-09-30 DIAGNOSIS — G629 Polyneuropathy, unspecified: Secondary | ICD-10-CM | POA: Diagnosis not present

## 2017-09-30 DIAGNOSIS — Z7722 Contact with and (suspected) exposure to environmental tobacco smoke (acute) (chronic): Secondary | ICD-10-CM | POA: Diagnosis not present

## 2017-09-30 DIAGNOSIS — I10 Essential (primary) hypertension: Secondary | ICD-10-CM | POA: Diagnosis not present

## 2017-09-30 DIAGNOSIS — E669 Obesity, unspecified: Secondary | ICD-10-CM | POA: Diagnosis not present

## 2017-09-30 DIAGNOSIS — R69 Illness, unspecified: Secondary | ICD-10-CM | POA: Diagnosis not present

## 2017-10-18 DIAGNOSIS — Z5181 Encounter for therapeutic drug level monitoring: Secondary | ICD-10-CM | POA: Diagnosis not present

## 2017-10-18 DIAGNOSIS — M479 Spondylosis, unspecified: Secondary | ICD-10-CM | POA: Diagnosis not present

## 2017-10-18 DIAGNOSIS — M19011 Primary osteoarthritis, right shoulder: Secondary | ICD-10-CM | POA: Diagnosis not present

## 2017-10-18 DIAGNOSIS — M13861 Other specified arthritis, right knee: Secondary | ICD-10-CM | POA: Diagnosis not present

## 2017-10-20 ENCOUNTER — Encounter: Payer: Self-pay | Admitting: Physician Assistant

## 2017-10-20 ENCOUNTER — Ambulatory Visit (INDEPENDENT_AMBULATORY_CARE_PROVIDER_SITE_OTHER): Payer: Medicare HMO | Admitting: Physician Assistant

## 2017-10-20 ENCOUNTER — Ambulatory Visit
Admission: RE | Admit: 2017-10-20 | Discharge: 2017-10-20 | Disposition: A | Discharge status: Home / Self Care | Payer: Medicare HMO | Source: Ambulatory Visit | Attending: Physician Assistant | Admitting: Physician Assistant

## 2017-10-20 VITALS — BP 118/78 | Temp 98.6°F | Resp 16 | Wt 201.0 lb

## 2017-10-20 DIAGNOSIS — M25512 Pain in left shoulder: Secondary | ICD-10-CM | POA: Insufficient documentation

## 2017-10-20 DIAGNOSIS — M19012 Primary osteoarthritis, left shoulder: Secondary | ICD-10-CM | POA: Insufficient documentation

## 2017-10-20 DIAGNOSIS — M62838 Other muscle spasm: Secondary | ICD-10-CM | POA: Insufficient documentation

## 2017-10-20 DIAGNOSIS — M67912 Unspecified disorder of synovium and tendon, left shoulder: Secondary | ICD-10-CM | POA: Diagnosis not present

## 2017-10-20 MED ORDER — BACLOFEN 10 MG PO TABS: 10.0000 mg | ORAL_TABLET | Freq: Three times a day (TID) | ORAL | 0 refills | Status: DC

## 2017-10-20 MED ORDER — METHYLPREDNISOLONE 4 MG PO TBPK: ORAL_TABLET | ORAL | 0 refills | Status: DC

## 2017-10-20 NOTE — Progress Notes (Signed)
Patient: Carrie Alvarez Female    DOB: 05/27/1951   67 y.o.   MRN: [id number] Visit Date: 10/20/2017  Today's Provider: Mar Daring, PA-C   Chief Complaint  Patient presents with  . Shoulder Pain   Subjective:    Shoulder Pain   The pain is present in the left shoulder. This is a recurrent problem. The current episode started more than 1 month ago. The problem occurs constantly. The problem has been gradually worsening. The quality of the pain is described as burning and aching. The pain is moderate. Associated symptoms include a limited range of motion. Pertinent negatives include no numbness. She has tried heat, rest and cold for the symptoms. The treatment provided no relief.    Patient reports that she was being seen at the pain clinic for back issues and chronic pain, but they report that she needs to see her PCP first for this issue as it is new.     No Known Allergies   Current Outpatient Medications:  .  acetaminophen (TYLENOL) 500 MG tablet, Take 500 mg by mouth every 6 (six) hours as needed for mild pain or moderate pain. , Disp: , Rfl:  .  albuterol (PROVENTIL HFA;VENTOLIN HFA) 108 (90 Base) MCG/ACT inhaler, Inhale 2 puffs into the lungs every 6 (six) hours as needed for wheezing or shortness of breath., Disp: 1 Inhaler, Rfl: 2 .  amLODipine (NORVASC) 10 MG tablet, Take 1 tablet (10 mg total) by mouth daily., Disp: 90 tablet, Rfl: 3 .  atorvastatin (LIPITOR) 10 MG tablet, Take 1 tablet (10 mg total) by mouth daily., Disp: 90 tablet, Rfl: 3 .  budesonide-formoterol (SYMBICORT) 80-4.5 MCG/ACT inhaler, Inhale 2 puffs into the lungs 2 (two) times daily., Disp: 1 Inhaler, Rfl: 0 .  cholecalciferol (VITAMIN D) 1000 units tablet, Take 1,000 Units by mouth daily., Disp: , Rfl:  .  cyclobenzaprine (FLEXERIL) 5 MG tablet, Take 5 mg by mouth daily as needed. , Disp: , Rfl:  .  fluticasone (FLONASE) 50 MCG/ACT nasal spray, Place 1 spray into both nostrils as needed  for allergies or rhinitis., Disp: , Rfl:  .  gabapentin (NEURONTIN) 300 MG capsule, Take 300 mg by mouth daily as needed (neuropathic pain). , Disp: , Rfl:  .  ibuprofen (ADVIL,MOTRIN) 200 MG tablet, Take 200 mg by mouth every 4 (four) hours as needed. , Disp: , Rfl:  .  letrozole (FEMARA) 2.5 MG tablet, Take 1 tablet (2.5 mg total) by mouth daily., Disp: 90 tablet, Rfl: 3 .  meloxicam (MOBIC) 15 MG tablet, TAKE 1 TABLET (15 MG) DAILY, Disp: 90 tablet, Rfl: 1 .  ondansetron (ZOFRAN) 4 MG tablet, Take 1 tablet (4 mg total) by mouth every 8 (eight) hours as needed for nausea or vomiting., Disp: 8 tablet, Rfl: 0 .  pyridoxine (B-6) 100 MG tablet, Take 100 mg by mouth daily., Disp: , Rfl:  .  sertraline (ZOLOFT) 100 MG tablet, Take 2 tablets (200 mg total) by mouth daily., Disp: 180 tablet, Rfl: 0 .  traMADol (ULTRAM) 50 MG tablet, Take 50 mg by mouth 4 (four) times daily. , Disp: , Rfl:  .  vitamin B-12 (CYANOCOBALAMIN) 500 MCG tablet, Take 500 mcg by mouth daily., Disp: , Rfl:  No current facility-administered medications for this visit.   Facility-Administered Medications Ordered in Other Visits:  .  heparin lock flush 100 unit/mL, 500 Units, Intravenous, Once, Finnegan, Kathlene November, MD .  sodium chloride 0.9 % injection  10 mL, 10 mL, Intravenous, Once, Finnegan, Kathlene November, MD .  sodium chloride flush (NS) 0.9 % injection 10 mL, 10 mL, Intravenous, PRN, [name], MD, 10 mL at 12/24/15 1444  Review of Systems  Constitutional: Positive for activity change.  Respiratory: Negative.   Cardiovascular: Negative.   Gastrointestinal: Negative.   Musculoskeletal: Positive for arthralgias, back pain, myalgias, neck pain and neck stiffness.  Neurological: Negative for tremors, weakness and numbness.    Social History   Tobacco Use  . Smoking status: Former Smoker    Packs/day: 0.50    Types: Cigarettes    Last attempt to quit: 07/18/2014    Years since quitting: 3.2  . Smokeless tobacco:  Never Used  . Tobacco comment: given smoking and pain information   Substance Use Topics  . Alcohol use: No    Alcohol/week: 0.0 oz   Objective:   BP 118/78 (BP Location: Left Arm, Patient Position: Sitting, Cuff Size: Large)   Temp 98.6 F (37 C)   Resp 16   Wt 201 lb (91.2 kg)   BMI 35.61 kg/m  Vitals:   10/20/17 1443  BP: 118/78  Resp: 16  Temp: 98.6 F (37 C)  Weight: 201 lb (91.2 kg)     Physical Exam  Constitutional: She appears well-developed and well-nourished. No distress.  Neck: Normal range of motion. Neck supple. No JVD present. No tracheal deviation present. No thyromegaly present.  Cardiovascular: Normal rate, regular rhythm and normal heart sounds. Exam reveals no gallop and no friction rub.  No murmur heard. Pulmonary/Chest: Effort normal and breath sounds normal. No respiratory distress. She has no wheezes. She has no rales.  Musculoskeletal:       Left shoulder: She exhibits decreased range of motion, tenderness, pain, spasm and decreased strength. She exhibits no bony tenderness, no swelling, no effusion, no crepitus, no deformity, no laceration and normal pulse.  Special testing was not performed due to patient reported pain.  Lymphadenopathy:    She has no cervical adenopathy.  Skin: She is not diaphoretic.  Vitals reviewed.       Assessment & Plan:     1. Tendinopathy of left rotator cuff Possible. Hard to judge due to patient reported pain level and limited motion. Patient did not want to move the shoulder past 45 degree abduction during exam but after exam patient picked up a moderately heavy purse with left arm, carried it under her arm and used her elbow with the left arm to open the door to the lobby. I will get imaging as below to R/O OA. Medrol dose pak as below for inflammation. Call if no improvements and will refer to ortho.  - DG Shoulder Left; Future - methylPREDNISolone (MEDROL) 4 MG TBPK tablet; 6 day taper; take as directed on package  instructions  Dispense: 21 tablet; Refill: 0  2. Muscle spasm Spasm was noted to be in the left upper trapezius muscle group. This was only true positive finding. Baclofen ordered as below for muscle relaxer.  - DG Shoulder Left; Future - baclofen (LIORESAL) 10 MG tablet; Take 1 tablet (10 mg total) by mouth 3 (three) times daily. (Patient not taking: Reported on 10/27/2017)  Dispense: 30 each; Refill: 0  3. Acute pain of left shoulder See above medical treatment plan. - methylPREDNISolone (MEDROL) 4 MG TBPK tablet; 6 day taper; take as directed on package instructions  Dispense: 21 tablet; Refill: Neville, PA-C  [facility]

## 2017-10-20 NOTE — Patient Instructions (Signed)

## 2017-10-21 ENCOUNTER — Telehealth: Payer: Self-pay

## 2017-10-21 NOTE — Telephone Encounter (Signed)
-----   Message from Mar Daring, PA-C sent at 10/21/2017  8:36 AM EDT ----- Mild arthritis noted in left shoulder. I don't think this would be source of pain. Still suspect rotator cuff. Continue with treatment as discussed.

## 2017-10-21 NOTE — Telephone Encounter (Signed)
Patient advised as directed below.  Thanks,  -Jo-Ann Johanning 

## 2017-10-26 ENCOUNTER — Other Ambulatory Visit: Payer: Self-pay | Admitting: Physician Assistant

## 2017-10-27 ENCOUNTER — Ambulatory Visit (INDEPENDENT_AMBULATORY_CARE_PROVIDER_SITE_OTHER): Payer: Medicare HMO | Admitting: Physician Assistant

## 2017-10-27 ENCOUNTER — Encounter: Payer: Self-pay | Admitting: Physician Assistant

## 2017-10-27 VITALS — BP 128/80 | HR 84 | Temp 98.0°F | Resp 16 | Wt 199.0 lb

## 2017-10-27 DIAGNOSIS — I10 Essential (primary) hypertension: Secondary | ICD-10-CM | POA: Diagnosis not present

## 2017-10-27 DIAGNOSIS — E7800 Pure hypercholesterolemia, unspecified: Secondary | ICD-10-CM

## 2017-10-27 DIAGNOSIS — M25512 Pain in left shoulder: Secondary | ICD-10-CM

## 2017-10-27 DIAGNOSIS — M62838 Other muscle spasm: Secondary | ICD-10-CM

## 2017-10-27 NOTE — Patient Instructions (Addendum)
Dyslipidemia Dyslipidemia is an imbalance of waxy, fat-like substances (lipids) in the blood. The body needs lipids in small amounts. Dyslipidemia often involves a high level of cholesterol or triglycerides, which are types of lipids. Common forms of dyslipidemia include:  High levels of bad cholesterol (LDL cholesterol). LDL is the type of cholesterol that causes fatty deposits (plaques) to build up in the blood vessels that carry blood away from your heart (arteries).  Low levels of good cholesterol (HDL cholesterol). HDL cholesterol is the type of cholesterol that protects against heart disease. High levels of HDL remove the LDL buildup from arteries.  High levels of triglycerides. Triglycerides are a fatty substance in the blood that is linked to a buildup of plaques in the arteries.  You can develop dyslipidemia because of the genes you are born with (primary dyslipidemia) or changes that occur during your life (secondary dyslipidemia), or as a side effect of certain medical treatments. What are the causes? Primary dyslipidemia is caused by changes (mutations) in genes that are passed down through families (inherited). These mutations cause several types of dyslipidemia. Mutations can result in disorders that make the body produce too much LDL cholesterol or triglycerides, or not enough HDL cholesterol. These disorders may lead to heart disease, arterial disease, or stroke at an early age. Causes of secondary dyslipidemia include certain lifestyle choices and diseases that lead to dyslipidemia, such as:  Eating a diet that is high in animal fat.  Not getting enough activity or exercise (having a sedentary lifestyle).  Having diabetes, kidney disease, liver disease, or thyroid disease.  Drinking large amounts of alcohol.  Using certain types of drugs.  What increases the risk? You may be at greater risk for dyslipidemia if you are an older man or if you are a woman who has gone through  menopause. Other risk factors include:  Having a family history of dyslipidemia.  Taking certain medicines, including birth control pills, steroids, some diuretics, beta-blockers, and some medicines forHIV.  Smoking cigarettes.  Eating a high-fat diet.  Drinking large amounts of alcohol.  Having certain medical conditions such as diabetes, polycystic ovary syndrome (PCOS), pregnancy, kidney disease, liver disease, or hypothyroidism.  Not exercising regularly.  Being overweight or obese with too much belly fat.  What are the signs or symptoms? Dyslipidemia does not usually cause any symptoms. Very high lipid levels can cause fatty bumps under the skin (xanthomas) or a white or gray ring around the black center (pupil) of the eye. Very high triglyceride levels can cause inflammation of the pancreas (pancreatitis). How is this diagnosed? Your health care provider may diagnose dyslipidemia based on a routine blood test (fasting blood test). Because most people do not have symptoms of the condition, this blood testing (lipid profile) is done on adults age 40 and older and is repeated every 5 years. This test checks:  Total cholesterol. This is a measure of the total amount of cholesterol in your blood, including LDL cholesterol, HDL cholesterol, and triglycerides. A healthy number is below 200.  LDL cholesterol. The target number for LDL cholesterol is different for each person, depending on individual risk factors. For most people, a number below 100 is healthy. Ask your health care provider what your LDL cholesterol number should be.  HDL cholesterol. An HDL level of 60 or higher is best because it helps to protect against heart disease. A number below 42 for men or below 39 for women increases the risk for heart disease.  Triglycerides. A  healthy triglyceride number is below 150.  If your lipid profile is abnormal, your health care provider may do other blood tests to get more  information about your condition. How is this treated? Treatment depends on the type of dyslipidemia that you have and your other risk factors for heart disease and stroke. Your health care provider will have a target range for your lipid levels based on this information. For many people, treatment starts with lifestyle changes, such as diet and exercise. Your health care provider may recommend that you:  Get regular exercise.  Make changes to your diet.  Quit smoking if you smoke.  If diet changes and exercise do not help you reach your goals, your health care provider may also prescribe medicine to lower lipids. The most commonly prescribed type of medicine lowers your LDL cholesterol (statin drug). If you have a high triglyceride level, your provider may prescribe another type of drug (fibrate) or an omega-3 fish oil supplement, or both. Follow these instructions at home:  Take over-the-counter and prescription medicines only as told by your health care provider. This includes supplements.  Get regular exercise. Start an aerobic exercise and strength training program as told by your health care provider. Ask your health care provider what activities are safe for you. Your health care provider may recommend: ? 30 minutes of aerobic activity 4-6 days a week. Brisk walking is an example of aerobic activity. ? Strength training 2 days a week.  Eat a healthy diet as told by your health care provider. This can help you reach and maintain a healthy weight, lower your LDL cholesterol, and raise your HDL cholesterol. It may help to work with a diet and nutrition specialist (dietitian) to make a plan that is right for you. Your dietitian or health care provider may recommend: ? Limiting your calories, if you are overweight. ? Eating more fruits, vegetables, whole grains, fish, and lean meats. ? Limiting saturated fat, trans fat, and cholesterol.  Follow instructions from your health care provider  or dietitian about eating or drinking restrictions.  Limit alcohol intake to no more than one drink per day for nonpregnant women and two drinks per day for men. One drink equals 12 oz of beer, 5 oz of wine, or 1 oz of hard liquor.  Do not use any products that contain nicotine or tobacco, such as cigarettes and e-cigarettes. If you need help quitting, ask your health care provider.  Keep all follow-up visits as told by your health care provider. This is important. Contact a health care provider if:  You are having trouble sticking to your exercise or diet plan.  You are struggling to quit smoking or control your use of alcohol. Summary  Dyslipidemia is an imbalance of waxy, fat-like substances (lipids) in the blood. The body needs lipids in small amounts. Dyslipidemia often involves a high level of cholesterol or triglycerides, which are types of lipids.  Treatment depends on the type of dyslipidemia that you have and your other risk factors for heart disease and stroke.  For many people, treatment starts with lifestyle changes, such as diet and exercise. Your health care provider may also prescribe medicine to lower lipids. This information is not intended to replace advice given to you by your health care provider. Make sure you discuss any questions you have with your health care provider. Document Released: 07/04/2013 Document Revised: 02/24/2016 Document Reviewed: 02/24/2016 Elsevier Interactive Patient Education  2018 [employer].  Hypertension Hypertension is another name  for high blood pressure. High blood pressure forces your heart to work harder to pump blood. This can cause problems over time. There are two numbers in a blood pressure reading. There is a top number (systolic) over a bottom number (diastolic). It is best to have a blood pressure below 120/80. Healthy choices can help lower your blood pressure. You may need medicine to help lower your blood pressure if:  Your  blood pressure cannot be lowered with healthy choices.  Your blood pressure is higher than 130/80.  Follow these instructions at home: Eating and drinking  If directed, follow the DASH eating plan. This diet includes: ? Filling half of your plate at each meal with fruits and vegetables. ? Filling one quarter of your plate at each meal with whole grains. Whole grains include whole wheat pasta, brown rice, and whole grain bread. ? Eating or drinking low-fat dairy products, such as skim milk or low-fat yogurt. ? Filling one quarter of your plate at each meal with low-fat (lean) proteins. Low-fat proteins include fish, skinless chicken, eggs, beans, and tofu. ? Avoiding fatty meat, cured and processed meat, or chicken with skin. ? Avoiding premade or processed food.  Eat less than 1,500 mg of salt (sodium) a day.  Limit alcohol use to no more than 1 drink a day for nonpregnant women and 2 drinks a day for men. One drink equals 12 oz of beer, 5 oz of wine, or 1 oz of hard liquor. Lifestyle  Work with your doctor to stay at a healthy weight or to lose weight. Ask your doctor what the best weight is for you.  Get at least 30 minutes of exercise that causes your heart to beat faster (aerobic exercise) most days of the week. This may include walking, swimming, or biking.  Get at least 30 minutes of exercise that strengthens your muscles (resistance exercise) at least 3 days a week. This may include lifting weights or pilates.  Do not use any products that contain nicotine or tobacco. This includes cigarettes and e-cigarettes. If you need help quitting, ask your doctor.  Check your blood pressure at home as told by your doctor.  Keep all follow-up visits as told by your doctor. This is important. Medicines  Take over-the-counter and prescription medicines only as told by your doctor. Follow directions carefully.  Do not skip doses of blood pressure medicine. The medicine does not work as  well if you skip doses. Skipping doses also puts you at risk for problems.  Ask your doctor about side effects or reactions to medicines that you should watch for. Contact a doctor if:  You think you are having a reaction to the medicine you are taking.  You have headaches that keep coming back (recurring).  You feel dizzy.  You have swelling in your ankles.  You have trouble with your vision. Get help right away if:  You get a very bad headache.  You start to feel confused.  You feel weak or numb.  You feel faint.  You get very bad pain in your: ? Chest. ? Belly (abdomen).  You throw up (vomit) more than once.  You have trouble breathing. Summary  Hypertension is another name for high blood pressure.  Making healthy choices can help lower blood pressure. If your blood pressure cannot be controlled with healthy choices, you may need to take medicine. This information is not intended to replace advice given to you by your health care provider. Make sure you  discuss any questions you have with your health care provider. Document Released: 12/16/2007 Document Revised: 05/27/2016 Document Reviewed: 05/27/2016 Elsevier Interactive Patient Education  [employer].

## 2017-10-27 NOTE — Progress Notes (Signed)
Patient: Carrie Alvarez Female    DOB: 10-26-1950   67 y.o.   MRN: [id number] Visit Date: 10/28/2017  Today's Provider: Trinna Post, PA-C   Chief Complaint  Patient presents with  . Hyperlipidemia  . Hypertension  . Shoulder Pain   Subjective:    Carrie Alvarez is a 67 y/o woman with history of left shoulder pain, HTN, and HLD presenting today for f/u of the same.   Saw Grace Bushy, PA-C last week and was prescribed steroid taper and muscle relaxer for left shoulder pain. Xray left shoulder showed mild degenerative joint disease but was otherwise unremarkable. She reports significant relief from treatment. Has started working out at [employer] with her sister.   HTN is currently controlled on amlodipine 10 mg.   Patient has been started on 10 mg Lipitor this past month for cardiac risk score. No DM II.   Shoulder Pain   The pain is present in the left shoulder. This is a new problem. There has been no history of extremity trauma. The problem has been gradually improving. Associated symptoms include a limited range of motion. Pertinent negatives include no fever, inability to bear weight, itching, joint locking, joint swelling, numbness, stiffness or tingling.  Hypertension  This is a chronic problem. The problem is controlled. Pertinent negatives include no anxiety, blurred vision, chest pain, headaches, malaise/fatigue, neck pain, orthopnea, palpitations, peripheral edema, PND, shortness of breath or sweats. There are no associated agents to hypertension. Risk factors for coronary artery disease include dyslipidemia. The current treatment provides moderate improvement. There are no compliance problems.   Hyperlipidemia  This is a chronic problem. Pertinent negatives include no chest pain, myalgias or shortness of breath. Current antihyperlipidemic treatment includes statins. There are no compliance problems.    BP Readings from Last 3 Encounters:    10/27/17 128/80  10/20/17 118/78  09/28/17 126/70   Lab Results  Component Value Date   CHOL 170 10/27/2017   CHOL 231 (H) 08/31/2017   CHOL 221 (H) 09/22/2016   Lab Results  Component Value Date   HDL 68 10/27/2017   HDL 69 08/31/2017   HDL 70 09/22/2016   Lab Results  Component Value Date   LDLCALC 84 10/27/2017   LDLCALC 147 (H) 08/31/2017   LDLCALC 133 (H) 09/22/2016   Lab Results  Component Value Date   TRIG 88 10/27/2017   TRIG 76 08/31/2017   TRIG 91 09/22/2016   Lab Results  Component Value Date   CHOLHDL 2.5 10/27/2017   CHOLHDL 3.3 08/31/2017   CHOLHDL 3.2 09/22/2016   No results found for: LDLDIRECT     No Known Allergies   Current Outpatient Medications:  .  acetaminophen (TYLENOL) 500 MG tablet, Take 500 mg by mouth every 6 (six) hours as needed for mild pain or moderate pain. , Disp: , Rfl:  .  albuterol (PROVENTIL HFA;VENTOLIN HFA) 108 (90 Base) MCG/ACT inhaler, Inhale 2 puffs into the lungs every 6 (six) hours as needed for wheezing or shortness of breath., Disp: 1 Inhaler, Rfl: 2 .  amLODipine (NORVASC) 10 MG tablet, Take 1 tablet (10 mg total) by mouth daily., Disp: 90 tablet, Rfl: 3 .  atorvastatin (LIPITOR) 10 MG tablet, Take 1 tablet (10 mg total) by mouth daily., Disp: 90 tablet, Rfl: 3 .  budesonide-formoterol (SYMBICORT) 80-4.5 MCG/ACT inhaler, Inhale 2 puffs into the lungs 2 (two) times daily., Disp: 1 Inhaler, Rfl: 0 .  cholecalciferol (VITAMIN D)  1000 units tablet, Take 1,000 Units by mouth daily., Disp: , Rfl:  .  cyclobenzaprine (FLEXERIL) 5 MG tablet, Take 5 mg by mouth daily as needed. , Disp: , Rfl:  .  fluticasone (FLONASE) 50 MCG/ACT nasal spray, Place 1 spray into both nostrils as needed for allergies or rhinitis., Disp: , Rfl:  .  gabapentin (NEURONTIN) 300 MG capsule, Take 300 mg by mouth daily as needed (neuropathic pain). , Disp: , Rfl:  .  ibuprofen (ADVIL,MOTRIN) 200 MG tablet, Take 200 mg by mouth every 4 (four) hours as  needed. , Disp: , Rfl:  .  letrozole (FEMARA) 2.5 MG tablet, Take 1 tablet (2.5 mg total) by mouth daily., Disp: 90 tablet, Rfl: 3 .  meloxicam (MOBIC) 15 MG tablet, TAKE 1 TABLET BY MOUTH ONCE DAILY, Disp: 90 tablet, Rfl: 1 .  methylPREDNISolone (MEDROL) 4 MG TBPK tablet, 6 day taper; take as directed on package instructions, Disp: 21 tablet, Rfl: 0 .  ondansetron (ZOFRAN) 4 MG tablet, Take 1 tablet (4 mg total) by mouth every 8 (eight) hours as needed for nausea or vomiting., Disp: 8 tablet, Rfl: 0 .  pyridoxine (B-6) 100 MG tablet, Take 100 mg by mouth daily., Disp: , Rfl:  .  sertraline (ZOLOFT) 100 MG tablet, Take 2 tablets (200 mg total) by mouth daily., Disp: 180 tablet, Rfl: 0 .  traMADol (ULTRAM) 50 MG tablet, Take 50 mg by mouth 4 (four) times daily. , Disp: , Rfl:  .  vitamin B-12 (CYANOCOBALAMIN) 500 MCG tablet, Take 500 mcg by mouth daily., Disp: , Rfl:  .  baclofen (LIORESAL) 10 MG tablet, Take 1 tablet (10 mg total) by mouth 2 (two) times daily., Disp: 30 each, Rfl: 0 No current facility-administered medications for this visit.   Facility-Administered Medications Ordered in Other Visits:  .  heparin lock flush 100 unit/mL, 500 Units, Intravenous, Once, Finnegan, Kathlene November, MD .  sodium chloride 0.9 % injection 10 mL, 10 mL, Intravenous, Once, Finnegan, Kathlene November, MD .  sodium chloride flush (NS) 0.9 % injection 10 mL, 10 mL, Intravenous, PRN, [name], MD, 10 mL at 12/24/15 1444  Review of Systems  Constitutional: Negative.  Negative for fever and malaise/fatigue.  HENT: Positive for postnasal drip. Negative for congestion, nosebleeds, rhinorrhea, sinus pressure and sinus pain.   Eyes: Negative for blurred vision.  Respiratory: Negative.  Negative for shortness of breath.   Cardiovascular: Negative.  Negative for chest pain, palpitations, orthopnea and PND.  Gastrointestinal: Negative.   Musculoskeletal: Positive for arthralgias. Negative for back pain, gait problem,  joint swelling, myalgias, neck pain, neck stiffness and stiffness.  Skin: Negative for itching.  Allergic/Immunologic: Positive for environmental allergies.  Neurological: Negative for dizziness, tingling, light-headedness, numbness and headaches.    Social History   Tobacco Use  . Smoking status: Former Smoker    Packs/day: 0.50    Types: Cigarettes    Last attempt to quit: 07/18/2014    Years since quitting: 3.2  . Smokeless tobacco: Never Used  . Tobacco comment: given smoking and pain information   Substance Use Topics  . Alcohol use: No    Alcohol/week: 0.0 oz   Objective:   BP 128/80 (BP Location: Right Arm, Patient Position: Sitting, Cuff Size: Large)   Pulse 84   Temp 98 F (36.7 C) (Oral)   Resp 16   Wt 199 lb (90.3 kg)   BMI 35.25 kg/m  Vitals:   10/27/17 0855  BP: 128/80  Pulse: 84  Resp: 16  Temp: 98 F (36.7 C)  TempSrc: Oral  Weight: 199 lb (90.3 kg)     Physical Exam  Constitutional: She is oriented to person, place, and time. She appears well-developed and well-nourished.  Cardiovascular: Normal rate and regular rhythm.  Pulmonary/Chest: Effort normal and breath sounds normal.  Neurological: She is alert and oriented to person, place, and time.  Skin: Skin is warm and dry.  Psychiatric: She has a normal mood and affect. Her behavior is normal.        Assessment & Plan:     1. Acute pain of left shoulder  Refilled baclofen, she has finished prednisone taper with good relief.   2. Hypercholesteremia  Lipid panel shows LDL reduced to 84, at goal for patient with increased CVD score but no DM or prior cardiac events.   - Lipid Profile  3. Essential hypertension  Controlled, continue medications.  4. Muscle spasm  - baclofen (LIORESAL) 10 MG tablet; Take 1 tablet (10 mg total) by mouth 2 (two) times daily.  Dispense: 30 each; Refill: 0  Return in about 4 months (around 02/26/2018), or HTN.  The entirety of the information documented in  the History of Present Illness, Review of Systems and Physical Exam were personally obtained by me. Portions of this information were initially documented by Ashley Royalty, CMA and reviewed by me for thoroughness and accuracy.         Trinna Post, PA-C  [facility]

## 2017-10-28 ENCOUNTER — Telehealth: Payer: Self-pay

## 2017-10-28 LAB — LIPID PANEL
Chol/HDL Ratio: 2.5 ratio (ref 0.0–4.4)
Cholesterol, Total: 170 mg/dL (ref 100–199)
HDL: 68 mg/dL (ref >39)
LDL Calculated: 84 mg/dL (ref 0–99)
Triglycerides: 88 mg/dL (ref 0–149)
VLDL Cholesterol Cal: 18 mg/dL (ref 5–40)

## 2017-10-28 MED ORDER — BACLOFEN 10 MG PO TABS: 10.0000 mg | ORAL_TABLET | Freq: Two times a day (BID) | ORAL | 0 refills | Status: AC

## 2017-10-28 NOTE — Telephone Encounter (Signed)
Pt advised.   Thanks,   -Stina Gane  

## 2017-10-28 NOTE — Telephone Encounter (Signed)
-----   Message from Trinna Post, [place] sent at 10/28/2017 10:00 AM EDT ----- LDL has come under control with Lipitor, continue at same dose. Will see her in follow up.

## 2017-11-02 ENCOUNTER — Ambulatory Visit: Payer: Medicare HMO | Admitting: Physician Assistant

## 2017-11-15 DIAGNOSIS — M19011 Primary osteoarthritis, right shoulder: Secondary | ICD-10-CM | POA: Diagnosis not present

## 2017-11-15 DIAGNOSIS — M479 Spondylosis, unspecified: Secondary | ICD-10-CM | POA: Diagnosis not present

## 2017-11-15 DIAGNOSIS — M13861 Other specified arthritis, right knee: Secondary | ICD-10-CM | POA: Diagnosis not present

## 2017-11-15 DIAGNOSIS — Z5181 Encounter for therapeutic drug level monitoring: Secondary | ICD-10-CM | POA: Diagnosis not present

## 2017-11-22 DIAGNOSIS — M5416 Radiculopathy, lumbar region: Secondary | ICD-10-CM | POA: Diagnosis not present

## 2017-11-23 DIAGNOSIS — H25813 Combined forms of age-related cataract, bilateral: Secondary | ICD-10-CM | POA: Diagnosis not present

## 2017-11-30 DIAGNOSIS — M545 Low back pain: Secondary | ICD-10-CM | POA: Diagnosis not present

## 2017-12-09 ENCOUNTER — Ambulatory Visit (INDEPENDENT_AMBULATORY_CARE_PROVIDER_SITE_OTHER): Payer: Medicare HMO | Admitting: Physician Assistant

## 2017-12-09 ENCOUNTER — Encounter: Payer: Self-pay | Admitting: Physician Assistant

## 2017-12-09 ENCOUNTER — Other Ambulatory Visit: Payer: Self-pay | Admitting: Physician Assistant

## 2017-12-09 VITALS — BP 110/82 | HR 85 | Temp 98.1°F | Resp 16 | Ht 63.0 in | Wt 202.0 lb

## 2017-12-09 DIAGNOSIS — Z111 Encounter for screening for respiratory tuberculosis: Secondary | ICD-10-CM

## 2017-12-09 DIAGNOSIS — F329 Major depressive disorder, single episode, unspecified: Secondary | ICD-10-CM | POA: Diagnosis not present

## 2017-12-09 DIAGNOSIS — Z021 Encounter for pre-employment examination: Secondary | ICD-10-CM | POA: Diagnosis not present

## 2017-12-09 DIAGNOSIS — R69 Illness, unspecified: Secondary | ICD-10-CM | POA: Diagnosis not present

## 2017-12-09 DIAGNOSIS — F32A Depression, unspecified: Secondary | ICD-10-CM

## 2017-12-09 MED ORDER — SERTRALINE HCL 100 MG PO TABS: 200.0000 mg | ORAL_TABLET | Freq: Every day | ORAL | 3 refills | Status: DC

## 2017-12-09 NOTE — Progress Notes (Signed)
Patient: Carrie Alvarez Female    DOB: 05/24/1951   67 y.o.   MRN: [id number] Visit Date: 12/09/2017  Today's Provider: Mar Daring, PA-C   No chief complaint on file.  Subjective:    HPI  Depression, Follow-up  She  was last seen for this 3 months ago. Changes made at last visit include no .   She reports excellent compliance with treatment. She is not having side effects.   She reports excellent tolerance of treatment. Current symptoms include: patient denies any symptoms She feels she is Improved since last visit.  ------------------------------------------------------------------------  Patient here to have some labs checked for a new job at [employer]. She needs a urine drug screen and a TB screening completed.                                                                                          No Known Allergies   Current Outpatient Medications:  .  acetaminophen (TYLENOL) 500 MG tablet, Take 500 mg by mouth every 6 (six) hours as needed for mild pain or moderate pain. , Disp: , Rfl:  .  albuterol (PROVENTIL HFA;VENTOLIN HFA) 108 (90 Base) MCG/ACT inhaler, Inhale 2 puffs into the lungs every 6 (six) hours as needed for wheezing or shortness of breath., Disp: 1 Inhaler, Rfl: 2 .  amLODipine (NORVASC) 10 MG tablet, Take 1 tablet (10 mg total) by mouth daily., Disp: 90 tablet, Rfl: 3 .  atorvastatin (LIPITOR) 10 MG tablet, Take 1 tablet (10 mg total) by mouth daily., Disp: 90 tablet, Rfl: 3 .  baclofen (LIORESAL) 10 MG tablet, Take 1 tablet (10 mg total) by mouth 2 (two) times daily., Disp: 30 each, Rfl: 0 .  budesonide-formoterol (SYMBICORT) 80-4.5 MCG/ACT inhaler, Inhale 2 puffs into the lungs 2 (two) times daily., Disp: 1 Inhaler, Rfl: 0 .  cholecalciferol (VITAMIN D) 1000 units tablet, Take 1,000 Units by mouth daily., Disp: , Rfl:  .  cyclobenzaprine (FLEXERIL) 5 MG tablet, Take 5 mg by mouth daily as needed. , Disp: , Rfl:  .   fluticasone (FLONASE) 50 MCG/ACT nasal spray, Place 1 spray into both nostrils as needed for allergies or rhinitis., Disp: , Rfl:  .  gabapentin (NEURONTIN) 300 MG capsule, Take 300 mg by mouth daily as needed (neuropathic pain). , Disp: , Rfl:  .  ibuprofen (ADVIL,MOTRIN) 200 MG tablet, Take 200 mg by mouth every 4 (four) hours as needed. , Disp: , Rfl:  .  letrozole (FEMARA) 2.5 MG tablet, Take 1 tablet (2.5 mg total) by mouth daily., Disp: 90 tablet, Rfl: 3 .  meloxicam (MOBIC) 15 MG tablet, TAKE 1 TABLET BY MOUTH ONCE DAILY, Disp: 90 tablet, Rfl: 1 .  pyridoxine (B-6) 100 MG tablet, Take 100 mg by mouth daily., Disp: , Rfl:  .  sertraline (ZOLOFT) 100 MG tablet, Take 2 tablets (200 mg total) by mouth daily., Disp: 180 tablet, Rfl: 0 .  traMADol (ULTRAM) 50 MG tablet, Take 50 mg by mouth 4 (four) times daily. , Disp: , Rfl:  .  vitamin B-12 (CYANOCOBALAMIN) 500 MCG tablet, Take 500 mcg  by mouth daily., Disp: , Rfl:  No current facility-administered medications for this visit.   Facility-Administered Medications Ordered in Other Visits:  .  heparin lock flush 100 unit/mL, 500 Units, Intravenous, Once, Finnegan, Kathlene November, MD .  sodium chloride 0.9 % injection 10 mL, 10 mL, Intravenous, Once, Finnegan, Kathlene November, MD .  sodium chloride flush (NS) 0.9 % injection 10 mL, 10 mL, Intravenous, PRN, [name], MD, 10 mL at 12/24/15 1444  Review of Systems  Constitutional: Negative.   Respiratory: Negative.   Cardiovascular: Negative.   Gastrointestinal: Negative.   Musculoskeletal: Negative.   Neurological: Negative.   Psychiatric/Behavioral: Negative.     Social History   Tobacco Use  . Smoking status: Former Smoker    Packs/day: 0.50    Types: Cigarettes    Last attempt to quit: 07/18/2014    Years since quitting: 3.3  . Smokeless tobacco: Never Used  . Tobacco comment: given smoking and pain information   Substance Use Topics  . Alcohol use: No    Alcohol/week: 0.0 oz    Objective:   BP 110/82 (BP Location: Left Arm, Patient Position: Sitting, Cuff Size: Large)   Pulse 85   Temp 98.1 F (36.7 C) (Oral)   Resp 16   Ht 5\' 3"  (1.6 m)   Wt 202 lb (91.6 kg)   SpO2 99%   BMI 35.78 kg/m  Vitals:   12/09/17 0831  BP: 110/82  Pulse: 85  Resp: 16  Temp: 98.1 F (36.7 C)  TempSrc: Oral  SpO2: 99%  Weight: 202 lb (91.6 kg)  Height: 5\' 3"  (1.6 m)     Physical Exam  Constitutional: She appears well-developed and well-nourished. No distress.  Neck: Normal range of motion. Neck supple.  Cardiovascular: Normal rate, regular rhythm and normal heart sounds. Exam reveals no gallop and no friction rub.  No murmur heard. Pulmonary/Chest: Effort normal and breath sounds normal. No respiratory distress. She has no wheezes. She has no rales.  Musculoskeletal: She exhibits no edema.  Skin: She is not diaphoretic.  Psychiatric: She has a normal mood and affect. Her behavior is normal. Judgment and thought content normal.  Vitals reviewed.       Assessment & Plan:     1. Encounter for pre-employment health screening examination Labs ordered and urine collected today for pre-employment screening as below. I will f/u pending results. Patient does have arthritis and DDD-lumbar spine but states she has no difficulties lifting and pulling weight. She is cleared for employment and form will be completed once labs resulted.  - Quantiferon tb gold assay - Drug Screen 12+Alcohol+CRT, Ur  2. Screening for tuberculosis See above medical treatment plan. - Quantiferon tb gold assay  3. Depression, unspecified depression type Stable. Doing well on current dose. Medication refilled as below. - sertraline (ZOLOFT) 100 MG tablet; Take 2 tablets (200 mg total) by mouth daily.  Dispense: 180 tablet; Refill: 3'      Mar Daring, PA-C  [facility]

## 2017-12-13 ENCOUNTER — Telehealth: Payer: Self-pay

## 2017-12-13 DIAGNOSIS — Z5181 Encounter for therapeutic drug level monitoring: Secondary | ICD-10-CM | POA: Diagnosis not present

## 2017-12-13 DIAGNOSIS — M13861 Other specified arthritis, right knee: Secondary | ICD-10-CM | POA: Diagnosis not present

## 2017-12-13 DIAGNOSIS — M19011 Primary osteoarthritis, right shoulder: Secondary | ICD-10-CM | POA: Diagnosis not present

## 2017-12-13 DIAGNOSIS — M479 Spondylosis, unspecified: Secondary | ICD-10-CM | POA: Diagnosis not present

## 2017-12-13 LAB — DRUG SCREEN 12+ALCOHOL+CRT, UR
Amphetamines, Urine: NEGATIVE ng/mL
BENZODIAZ UR QL: NEGATIVE ng/mL
Barbiturate: NEGATIVE ng/mL
Cannabinoids: NEGATIVE ng/mL
Cocaine (Metabolite): NEGATIVE ng/mL
Creatinine, Urine: 250 mg/dL (ref 20.0–300.0)
Ethanol, Urine: NEGATIVE %
Meperidine: NEGATIVE ng/mL
Methadone: NEGATIVE ng/mL
OPIATE SCREEN URINE: NEGATIVE ng/mL
Oxycodone/Oxymorphone, Urine: NEGATIVE ng/mL
Phencyclidine: NEGATIVE ng/mL
Propoxyphene: NEGATIVE ng/mL
Tramadol: POSITIVE — AB

## 2017-12-13 LAB — QUANTIFERON-TB GOLD PLUS
QuantiFERON Mitogen Value: >10 IU/mL
QuantiFERON Nil Value: 0.02 IU/mL
QuantiFERON TB1 Ag Value: 0.2 IU/mL
QuantiFERON TB2 Ag Value: 0.13 IU/mL
QuantiFERON-TB Gold Plus: NEGATIVE

## 2017-12-13 NOTE — Telephone Encounter (Signed)
-----   Message from Mar Daring, PA-C sent at 12/13/2017  8:25 AM EDT ----- Quantiferon gold is negative. Please print lab results of this and urine drug screen for her employer form.

## 2017-12-13 NOTE — Telephone Encounter (Signed)
-----   Message from Mar Daring, [place] sent at 12/13/2017  8:24 AM EDT ----- Drug screen is normal, only positive for tramadol which you have as prescription. Quantiferon gold is still pending. Once resulted I will print all labs and have with your form.

## 2017-12-13 NOTE — Telephone Encounter (Signed)
Patient advised as below.  

## 2017-12-15 DIAGNOSIS — M5416 Radiculopathy, lumbar region: Secondary | ICD-10-CM | POA: Diagnosis not present

## 2018-01-03 NOTE — Progress Notes (Signed)
[place]  [phone] [phone]  ID: Norm Salt OB: June 09, 1951  MR#: [id number]  GYK#:[id number]  Patient Care Team: Paulene Floor as PCP - General (Physician Assistant) [name], MD as Consulting Physician (Oncology) [name], MD as Attending Physician (Pain Medicine) [name]., MD as Consulting Physician (Ophthalmology)  CHIEF COMPLAINT: Stage IIa ER+ left breast cancer with no breast lesion and axillary lymph node metastasis.  INTERVAL HISTORY: Patient returns to clinic today for routine six-month follow-up.  She continues to feel well and remains asymptomatic.  She is tolerating letrozole well, although admits to periodic hot flashes that do not affect her day-to-day activity.  She continues to have a mild peripheral neuropathy, but no other neurologic complaints.  She has a good appetite and denies weight loss.  She denies any pain.  She denies any recent fevers. She has no chest pain or shortness of breath. She denies any nausea, vomiting, constipation, or diarrhea.  She has no urinary complaints.  Patient offers no further specific complaints today.  REVIEW OF SYSTEMS:   Review of Systems  Constitutional: Negative.  Negative for fever, malaise/fatigue and weight loss.  Respiratory: Negative.  Negative for cough and shortness of breath.   Cardiovascular: Negative.  Negative for chest pain and leg swelling.  Gastrointestinal: Negative.  Negative for abdominal pain.  Genitourinary: Negative.  Negative for dysuria.  Musculoskeletal: Positive for back pain.  Skin: Negative.  Negative for rash.  Neurological: Positive for sensory change. Negative for dizziness, focal weakness, weakness and headaches.  Psychiatric/Behavioral: Negative.  Negative for depression. The patient is not nervous/anxious.     As per HPI. Otherwise, a complete review of systems is negative.  PAST MEDICAL HISTORY: Past  Medical History:  Diagnosis Date  . Allergy   . Anemia   . Back pain   . Breast cancer ([place]) 2015   LT LUMPECTOMY 12-2014 FOLLOWING CHEMO  . Carpal tunnel syndrome   . Depression   . GERD (gastroesophageal reflux disease)   . Hypertension   . Obesity   . Uterine cancer ([place]) 1994    PAST SURGICAL HISTORY: Past Surgical History:  Procedure Laterality Date  . ABDOMINAL HYSTERECTOMY  1994   UTERINE CA  . AXILLARY LYMPH NODE BIOPSY Left 12/18/2014   Procedure: AXILLARY LYMPH NODE BIOPSY/;  Surgeon: Robert Bellow, MD;  Location: [facility] ORS;  Service: General;  Laterality: Left;  . AXILLARY LYMPH NODE DISSECTION Left 12/18/2014   Procedure: AXILLARY LYMPH NODE DISSECTION;  Surgeon: Robert Bellow, MD;  Location: [facility] ORS;  Service: General;  Laterality: Left;  . BREAST BIOPSY Left 05/2014   CORE - POS  . BREAST SURGERY    . CHOLECYSTECTOMY N/A 04/19/2016   Procedure: LAPAROSCOPIC CHOLECYSTECTOMY WITH INTRAOPERATIVE CHOLANGIOGRAM;  Surgeon: Hubbard Robinson, MD;  Location: [facility] ORS;  Service: General;  Laterality: N/A;  . medial branch block  11/06/2015   lumbar facet Dr. Primus Bravo  . SENTINEL NODE BIOPSY Left 12/18/2014   Procedure: SENTINEL NODE BIOPSY;  Surgeon: Robert Bellow, MD;  Location: [facility] ORS;  Service: General;  Laterality: Left;    FAMILY HISTORY Family History  Problem Relation Age of Onset  . Breast cancer Sister 20  . Colon cancer Sister   . Colon cancer Mother   . Hypertension Mother   . Asthma Mother   . Cancer Father        ADVANCED DIRECTIVES:    HEALTH MAINTENANCE: Social History   Tobacco  Use  . Smoking status: Former Smoker    Packs/day: 0.50    Types: Cigarettes    Last attempt to quit: 07/18/2014    Years since quitting: 3.4  . Smokeless tobacco: Never Used  . Tobacco comment: given smoking and pain information   Substance Use Topics  . Alcohol use: No    Alcohol/week: 0.0 oz  . Drug use: No    No Known Allergies  Current Outpatient  Medications  Medication Sig Dispense Refill  . acetaminophen (TYLENOL) 500 MG tablet Take 500 mg by mouth every 6 (six) hours as needed for mild pain or moderate pain.     Marland Kitchen albuterol (PROVENTIL HFA;VENTOLIN HFA) 108 (90 Base) MCG/ACT inhaler Inhale 2 puffs into the lungs every 6 (six) hours as needed for wheezing or shortness of breath. 1 Inhaler 2  . amLODipine (NORVASC) 10 MG tablet Take 1 tablet (10 mg total) by mouth daily. 90 tablet 3  . atorvastatin (LIPITOR) 10 MG tablet Take 1 tablet (10 mg total) by mouth daily. 90 tablet 3  . baclofen (LIORESAL) 10 MG tablet Take 1 tablet (10 mg total) by mouth 2 (two) times daily. 30 each 0  . cholecalciferol (VITAMIN D) 1000 units tablet Take 1,000 Units by mouth daily.    . cyclobenzaprine (FLEXERIL) 5 MG tablet Take 5 mg by mouth daily as needed.     . fluticasone (FLONASE) 50 MCG/ACT nasal spray Place 1 spray into both nostrils as needed for allergies or rhinitis.    Marland Kitchen gabapentin (NEURONTIN) 300 MG capsule Take 300 mg by mouth daily as needed (neuropathic pain).     Marland Kitchen ibuprofen (ADVIL,MOTRIN) 200 MG tablet Take 200 mg by mouth every 4 (four) hours as needed.     Marland Kitchen letrozole (FEMARA) 2.5 MG tablet Take 1 tablet (2.5 mg total) by mouth daily. 90 tablet 3  . meloxicam (MOBIC) 15 MG tablet TAKE 1 TABLET BY MOUTH ONCE DAILY 90 tablet 1  . pyridoxine (B-6) 100 MG tablet Take 100 mg by mouth daily.    . sertraline (ZOLOFT) 100 MG tablet Take 2 tablets (200 mg total) by mouth daily. 180 tablet 3  . traMADol (ULTRAM) 50 MG tablet Take 50 mg by mouth 4 (four) times daily.     . vitamin B-12 (CYANOCOBALAMIN) 500 MCG tablet Take 500 mcg by mouth daily.     No current facility-administered medications for this visit.    Facility-Administered Medications Ordered in Other Visits  Medication Dose Route Frequency Provider Last Rate Last Dose  . heparin lock flush 100 unit/mL  500 Units Intravenous Once [name], MD      . sodium chloride 0.9 %  injection 10 mL  10 mL Intravenous Once [name], MD      . sodium chloride flush (NS) 0.9 % injection 10 mL  10 mL Intravenous PRN [name], MD   10 mL at 12/24/15 1444    OBJECTIVE: Vitals:   01/04/18 1434  BP: 124/90  Pulse: 81  Resp: 16  Temp: 97.9 F (36.6 C)     Body mass index is 36.19 kg/m.    ECOG FS:1 - Symptomatic but completely ambulatory  General: Well-developed, well-nourished, no acute distress. Eyes: Pink conjunctiva, anicteric sclera. Breast: Patient declined exam today. Lungs: Clear to auscultation bilaterally. Heart: Regular rate and rhythm. No rubs, murmurs, or gallops. Abdomen: Soft, nontender, nondistended. No organomegaly noted, normoactive bowel sounds. Musculoskeletal: No edema, cyanosis, or clubbing. Neuro: Alert, answering all questions  appropriately. Cranial nerves grossly intact. Skin: No rashes or petechiae noted. Psych: Normal affect.  LAB RESULTS:  Lab Results  Component Value Date   NA 138 09/20/2017   K 4.1 09/20/2017   CL 106 09/20/2017   CO2 20 (L) 09/20/2017   GLUCOSE 115 (H) 09/20/2017   BUN 24 (H) 09/20/2017   CREATININE 1.05 (H) 09/20/2017   CALCIUM 9.9 09/20/2017   PROT 8.5 (H) 09/20/2017   ALBUMIN 4.5 09/20/2017   AST 29 09/20/2017   ALT 16 09/20/2017   ALKPHOS 89 09/20/2017   BILITOT 1.0 09/20/2017   GFRNONAA 54 (L) 09/20/2017   GFRAA >60 09/20/2017    Lab Results  Component Value Date   WBC 15.5 (H) 09/20/2017   NEUTROABS 5.7 12/28/2016   HGB 14.1 09/20/2017   HCT 42.6 09/20/2017   MCV 82.8 09/20/2017   PLT 215 09/20/2017     STUDIES: No results found.  ASSESSMENT: Stage IIa ER+ left breast cancer with no breast lesion and axillary lymph node metastasis. (TxN1M0)  HER-2 negative.   PLAN:   1. Stage IIa ER+ left breast cancer with no breast lesion and axillary lymph node metastasis: Despite no obvious breast lesion on mammogram or breast MRI, pathology and pattern of spread was  consistent with primary breast cancer. CT and bone scan at time of diagnosis revealed no other evidence of malignancy.  Patient completed her chemotherapy on Nov 19, 2014.  She only received 3 cycles of Adriamycin and Cytoxan and did not complete a total of 12 cycles of weekly Taxol secondary to her peripheral neuropathy.  Patient elected not to pursue axillary node dissection given the potential morbidity of this procedure. It was also elected not to pursue adjuvant XRT given there was no primary breast lesion.  Continue adjuvant letrozole for a total of 5 years completing in July 2021.  Her most recent mammogram on June 28, 2017 was reported as BI-RADS 2, repeat in December 2019.  Baseline bone mineral density on January 24, 2015 with a T score of -0.7, which is considered normal.  She has not had a bone mineral density since that time, therefore will repeat one in the next 1 to 2 weeks.  Return to clinic in 6 months for routine evaluation.   2. Family history: Patient's sister reports she has positive for Lynch syndrome, but these results were never confirmed. Patient had a colonoscopy in October 2012 that was reported as normal.  Consider genetic testing in the future. 3. Hypertension: Patient's blood pressure is within normal limits today.   4. Pain/Peripheral neuropathy: Chronic and unchanged. 5. Hot flashes: Likely secondary to letrozole.  Previously, patient was offered a change in treatment or treatment for her hot flashes, both of which she declined.     Patient expressed understanding and was in agreement with this plan. She also understands that She can call clinic at any time with any questions, concerns, or complaints.   Breast cancer metastasized to axillary lymph node   Staging form: Breast, AJCC 7th Edition     Clinical stage from 10/22/2014: Stage Unknown (TX, N1, M0) - Signed by [name], MD on 10/22/2014   [name], MD   01/07/2018 12:53 PM

## 2018-01-04 ENCOUNTER — Inpatient Hospital Stay: Payer: Medicare HMO | Attending: Oncology | Admitting: Oncology

## 2018-01-04 VITALS — BP 124/90 | HR 81 | Temp 97.9°F | Resp 16 | Wt 204.3 lb

## 2018-01-04 DIAGNOSIS — G629 Polyneuropathy, unspecified: Secondary | ICD-10-CM | POA: Diagnosis not present

## 2018-01-04 DIAGNOSIS — C50912 Malignant neoplasm of unspecified site of left female breast: Secondary | ICD-10-CM | POA: Insufficient documentation

## 2018-01-04 DIAGNOSIS — Z9221 Personal history of antineoplastic chemotherapy: Secondary | ICD-10-CM | POA: Insufficient documentation

## 2018-01-04 DIAGNOSIS — N951 Menopausal and female climacteric states: Secondary | ICD-10-CM | POA: Diagnosis not present

## 2018-01-04 DIAGNOSIS — C773 Secondary and unspecified malignant neoplasm of axilla and upper limb lymph nodes: Secondary | ICD-10-CM

## 2018-01-04 DIAGNOSIS — I10 Essential (primary) hypertension: Secondary | ICD-10-CM

## 2018-01-04 DIAGNOSIS — Z79811 Long term (current) use of aromatase inhibitors: Secondary | ICD-10-CM | POA: Insufficient documentation

## 2018-01-04 DIAGNOSIS — Z170 Estrogen receptor positive status [ER+]: Secondary | ICD-10-CM | POA: Diagnosis not present

## 2018-01-04 NOTE — Progress Notes (Signed)
Pt in for 6 month follow up.  

## 2018-01-19 DIAGNOSIS — M5416 Radiculopathy, lumbar region: Secondary | ICD-10-CM | POA: Diagnosis not present

## 2018-02-03 ENCOUNTER — Ambulatory Visit
Admission: RE | Admit: 2018-02-03 | Discharge: 2018-02-03 | Disposition: A | Discharge status: Home / Self Care | Payer: Medicare HMO | Source: Ambulatory Visit | Attending: Oncology | Admitting: Oncology

## 2018-02-03 DIAGNOSIS — C50912 Malignant neoplasm of unspecified site of left female breast: Secondary | ICD-10-CM | POA: Diagnosis not present

## 2018-02-03 DIAGNOSIS — Z79811 Long term (current) use of aromatase inhibitors: Secondary | ICD-10-CM | POA: Diagnosis not present

## 2018-02-03 DIAGNOSIS — C773 Secondary and unspecified malignant neoplasm of axilla and upper limb lymph nodes: Secondary | ICD-10-CM | POA: Diagnosis not present

## 2018-02-03 DIAGNOSIS — Z13820 Encounter for screening for osteoporosis: Secondary | ICD-10-CM | POA: Diagnosis not present

## 2018-02-03 DIAGNOSIS — Z780 Asymptomatic menopausal state: Secondary | ICD-10-CM | POA: Diagnosis not present

## 2018-02-03 DIAGNOSIS — M5416 Radiculopathy, lumbar region: Secondary | ICD-10-CM | POA: Diagnosis not present

## 2018-02-14 DIAGNOSIS — Z5181 Encounter for therapeutic drug level monitoring: Secondary | ICD-10-CM | POA: Diagnosis not present

## 2018-02-14 DIAGNOSIS — M19011 Primary osteoarthritis, right shoulder: Secondary | ICD-10-CM | POA: Diagnosis not present

## 2018-02-14 DIAGNOSIS — M479 Spondylosis, unspecified: Secondary | ICD-10-CM | POA: Diagnosis not present

## 2018-02-14 DIAGNOSIS — M13861 Other specified arthritis, right knee: Secondary | ICD-10-CM | POA: Diagnosis not present

## 2018-03-01 ENCOUNTER — Ambulatory Visit: Payer: Self-pay | Admitting: Physician Assistant

## 2018-03-04 ENCOUNTER — Encounter: Payer: Self-pay | Admitting: Physician Assistant

## 2018-03-04 ENCOUNTER — Ambulatory Visit (INDEPENDENT_AMBULATORY_CARE_PROVIDER_SITE_OTHER): Payer: Medicare HMO | Admitting: Physician Assistant

## 2018-03-04 VITALS — BP 128/72 | HR 72 | Temp 98.5°F | Resp 16 | Wt 204.0 lb

## 2018-03-04 DIAGNOSIS — F32A Depression, unspecified: Secondary | ICD-10-CM

## 2018-03-04 DIAGNOSIS — F329 Major depressive disorder, single episode, unspecified: Secondary | ICD-10-CM | POA: Diagnosis not present

## 2018-03-04 DIAGNOSIS — E7800 Pure hypercholesterolemia, unspecified: Secondary | ICD-10-CM | POA: Diagnosis not present

## 2018-03-04 DIAGNOSIS — R69 Illness, unspecified: Secondary | ICD-10-CM | POA: Diagnosis not present

## 2018-03-04 DIAGNOSIS — N182 Chronic kidney disease, stage 2 (mild): Secondary | ICD-10-CM

## 2018-03-04 DIAGNOSIS — J309 Allergic rhinitis, unspecified: Secondary | ICD-10-CM

## 2018-03-04 DIAGNOSIS — I10 Essential (primary) hypertension: Secondary | ICD-10-CM

## 2018-03-04 MED ORDER — FLUTICASONE PROPIONATE 50 MCG/ACT NA SUSP: 2.0000 | NASAL | 0 refills | Status: DC | PRN

## 2018-03-04 MED ORDER — SERTRALINE HCL 100 MG PO TABS: 200.0000 mg | ORAL_TABLET | Freq: Every day | ORAL | 0 refills | Status: DC

## 2018-03-04 MED ORDER — ATORVASTATIN CALCIUM 10 MG PO TABS: 10.0000 mg | ORAL_TABLET | Freq: Every day | ORAL | 0 refills | Status: DC

## 2018-03-04 MED ORDER — AMLODIPINE BESYLATE 10 MG PO TABS: 10.0000 mg | ORAL_TABLET | Freq: Every day | ORAL | 0 refills | Status: DC

## 2018-03-04 NOTE — Patient Instructions (Signed)

## 2018-03-04 NOTE — Progress Notes (Signed)
Patient: Carrie Alvarez Female    DOB: 10/27/1950   67 y.o.   MRN: [id number] Visit Date: 03/04/2018  Today's Provider: Trinna Post, PA-C   Chief Complaint  Patient presents with  . Hypertension   Subjective:    HPI  Hypertension, follow-up:  BP Readings from Last 3 Encounters:  03/04/18 128/72  01/04/18 124/90  12/09/17 110/82    She was last seen for hypertension 4 months ago.  BP at that visit was 128/80. Management since that visit includes no changes. She reports good compliance with treatment. She is not having side effects.  She is not exercising. She is adherent to low salt diet.   Outside blood pressures are checked occasionally. She is experiencing myalgias.   Patient denies exertional chest pressure/discomfort, lower extremity edema and palpitations.   Weight trend: stable Wt Readings from Last 3 Encounters:  03/04/18 204 lb (92.5 kg)  01/04/18 204 lb 5 oz (92.7 kg)  12/09/17 202 lb (91.6 kg)    Current diet: well balanced   HLD: Started Lipitor 10 mg several months ago. Cholesterol has come under good control for primary prevention.   Lipid Panel     Component Value Date/Time   CHOL 170 10/27/2017 0927   TRIG 88 10/27/2017 0927   HDL 68 10/27/2017 0927   CHOLHDL 2.5 10/27/2017 0927   LDLCALC 84 10/27/2017 0927   Depression: Taking zoloft 200 mg daily.    Patient also mentions that she is having severe muscle aches. She states, "my muscles hurt all over my body." She occasionally takes Flexeril which helps. She has been seen for left rotator cuff tendinopathy in the past. She had a prednisone taper in March. She is currently taking Meloxicam 15 mg daily.   She is followed by Dr. Grayland Ormond for her breast cancer.   No Known Allergies   Current Outpatient Medications:  .  acetaminophen (TYLENOL) 500 MG tablet, Take 500 mg by mouth every 6 (six) hours as needed for mild pain or moderate pain. , Disp: , Rfl:  .  albuterol  (PROVENTIL HFA;VENTOLIN HFA) 108 (90 Base) MCG/ACT inhaler, Inhale 2 puffs into the lungs every 6 (six) hours as needed for wheezing or shortness of breath., Disp: 1 Inhaler, Rfl: 2 .  amLODipine (NORVASC) 10 MG tablet, Take 1 tablet (10 mg total) by mouth daily., Disp: 90 tablet, Rfl: 0 .  atorvastatin (LIPITOR) 10 MG tablet, Take 1 tablet (10 mg total) by mouth daily., Disp: 90 tablet, Rfl: 0 .  baclofen (LIORESAL) 10 MG tablet, Take 1 tablet (10 mg total) by mouth 2 (two) times daily., Disp: 30 each, Rfl: 0 .  cholecalciferol (VITAMIN D) 1000 units tablet, Take 1,000 Units by mouth daily., Disp: , Rfl:  .  cyclobenzaprine (FLEXERIL) 5 MG tablet, Take 5 mg by mouth daily as needed. , Disp: , Rfl:  .  fluticasone (FLONASE) 50 MCG/ACT nasal spray, Place 2 sprays into both nostrils as needed for allergies or rhinitis., Disp: 16 g, Rfl: 0 .  gabapentin (NEURONTIN) 300 MG capsule, Take 300 mg by mouth daily as needed (neuropathic pain). , Disp: , Rfl:  .  letrozole (FEMARA) 2.5 MG tablet, Take 1 tablet (2.5 mg total) by mouth daily., Disp: 90 tablet, Rfl: 3 .  meloxicam (MOBIC) 15 MG tablet, TAKE 1 TABLET BY MOUTH ONCE DAILY, Disp: 90 tablet, Rfl: 1 .  pyridoxine (B-6) 100 MG tablet, Take 100 mg by mouth daily., Disp: , Rfl:  .  sertraline (ZOLOFT) 100 MG tablet, Take 2 tablets (200 mg total) by mouth daily., Disp: 180 tablet, Rfl: 0 .  traMADol (ULTRAM) 50 MG tablet, Take 50 mg by mouth 4 (four) times daily. , Disp: , Rfl:  .  vitamin B-12 (CYANOCOBALAMIN) 500 MCG tablet, Take 500 mcg by mouth daily., Disp: , Rfl:  No current facility-administered medications for this visit.   Facility-Administered Medications Ordered in Other Visits:  .  heparin lock flush 100 unit/mL, 500 Units, Intravenous, Once, Finnegan, Kathlene November, MD  Review of Systems  Constitutional: Negative for activity change, chills, diaphoresis and fatigue.  Respiratory: Negative for cough and shortness of breath.   Cardiovascular:  Negative for chest pain, palpitations and leg swelling.  Musculoskeletal: Positive for arthralgias, back pain and myalgias.  Neurological: Negative for dizziness, weakness, light-headedness, numbness and headaches.    Social History   Tobacco Use  . Smoking status: Former Smoker    Packs/day: 0.50    Types: Cigarettes    Last attempt to quit: 07/18/2014    Years since quitting: 3.6  . Smokeless tobacco: Never Used  . Tobacco comment: given smoking and pain information   Substance Use Topics  . Alcohol use: No    Alcohol/week: 0.0 standard drinks   Objective:   BP 128/72 (BP Location: Right Arm, Patient Position: Sitting, Cuff Size: Large)   Pulse 72   Temp 98.5 F (36.9 C)   Resp 16   Wt 204 lb (92.5 kg)   SpO2 98%   BMI 36.14 kg/m  Vitals:   03/04/18 0941  BP: 128/72  Pulse: 72  Resp: 16  Temp: 98.5 F (36.9 C)  SpO2: 98%  Weight: 204 lb (92.5 kg)     Physical Exam  Constitutional: She is oriented to person, place, and time. She appears well-developed and well-nourished.  Cardiovascular: Normal rate and regular rhythm.  Pulmonary/Chest: Effort normal and breath sounds normal.  Neurological: She is alert and oriented to person, place, and time.  Skin: Skin is warm and dry.  Psychiatric: She has a normal mood and affect. Her behavior is normal.        Assessment & Plan:     1. Essential hypertension  Well controlled today.   - amLODipine (NORVASC) 10 MG tablet; Take 1 tablet (10 mg total) by mouth daily.  Dispense: 90 tablet; Refill: 0 - Comprehensive Metabolic Panel (CMET)  2. Depression, unspecified depression type  - sertraline (ZOLOFT) 100 MG tablet; Take 2 tablets (200 mg total) by mouth daily.  Dispense: 180 tablet; Refill: 0  3. Hypercholesteremia  Do not think lipitor is causing her MSK pain, she has had longstanding MSK pain. Continue, this is controlling her cholesterol well.  - atorvastatin (LIPITOR) 10 MG tablet; Take 1 tablet (10 mg total)  by mouth daily.  Dispense: 90 tablet; Refill: 0  4. Allergic rhinitis, unspecified seasonality, unspecified trigger  - fluticasone (FLONASE) 50 MCG/ACT nasal spray; Place 2 sprays into both nostrils as needed for allergies or rhinitis.  Dispense: 16 g; Refill: 0  5. Stage 2 chronic kidney disease  She is currently taking Meloxicam 15 mg daily and I would like her to stop this.   Return in about 3 months (around 06/04/2018) for CMET, HTN, HLD.  The entirety of the information documented in the History of Present Illness, Review of Systems and Physical Exam were personally obtained by me. Portions of this information were initially documented by Wilburt Finlay, CMA and reviewed by me for thoroughness and  accuracy.            Trinna Post, PA-C  [facility]

## 2018-03-05 LAB — COMPREHENSIVE METABOLIC PANEL
ALT: 12 IU/L (ref 0–32)
AST: 16 IU/L (ref 0–40)
Albumin/Globulin Ratio: 1.6 (ref 1.2–2.2)
Albumin: 4.2 g/dL (ref 3.6–4.8)
Alkaline Phosphatase: 97 IU/L (ref 39–117)
BUN/Creatinine Ratio: 16 (ref 12–28)
BUN: 20 mg/dL (ref 8–27)
Bilirubin Total: 0.5 mg/dL (ref 0.0–1.2)
CO2: 23 mmol/L (ref 20–29)
Calcium: 9.7 mg/dL (ref 8.7–10.3)
Chloride: 107 mmol/L — ABNORMAL HIGH (ref 96–106)
Creatinine, Ser: 1.22 mg/dL — ABNORMAL HIGH (ref 0.57–1.00)
GFR calc Af Amer: 53 mL/min/{1.73_m2} — ABNORMAL LOW (ref >59)
GFR calc non Af Amer: 46 mL/min/{1.73_m2} — ABNORMAL LOW (ref >59)
Globulin, Total: 2.6 g/dL (ref 1.5–4.5)
Glucose: 80 mg/dL (ref 65–99)
Potassium: 4.1 mmol/L (ref 3.5–5.2)
Sodium: 144 mmol/L (ref 134–144)
Total Protein: 6.8 g/dL (ref 6.0–8.5)

## 2018-03-15 DIAGNOSIS — M479 Spondylosis, unspecified: Secondary | ICD-10-CM | POA: Diagnosis not present

## 2018-03-15 DIAGNOSIS — Z5181 Encounter for therapeutic drug level monitoring: Secondary | ICD-10-CM | POA: Diagnosis not present

## 2018-03-15 DIAGNOSIS — M19011 Primary osteoarthritis, right shoulder: Secondary | ICD-10-CM | POA: Diagnosis not present

## 2018-03-15 DIAGNOSIS — M13861 Other specified arthritis, right knee: Secondary | ICD-10-CM | POA: Diagnosis not present

## 2018-03-16 ENCOUNTER — Telehealth: Payer: Self-pay

## 2018-03-16 DIAGNOSIS — R200 Anesthesia of skin: Secondary | ICD-10-CM

## 2018-03-16 NOTE — Telephone Encounter (Signed)
Received mail from Burtis Junes, M.D., PA  Requesting Mrs. Laflam to be referred to Neurologist.  Dr. Primus Bravo also request to obtain PNGV/EMG of upper extremity to evaluate RUE paresthesia and evaluate for CTS vs cervical radiculopathy vs other etiology

## 2018-03-17 NOTE — Telephone Encounter (Signed)
If there is concern for cervical radiculopathy to the extent that she will need surgery, she would be better served by neurosurgery. Neurology also would not treat carpal tunnel syndrome. I think she would be better served by neurosurgery so would refer there if she's agreeable.

## 2018-03-18 NOTE — Telephone Encounter (Signed)
Patient is agreeable to referral to neurosurgery.

## 2018-03-18 NOTE — Addendum Note (Signed)
Addended by: Trinna Post on: 03/18/2018 09:46 AM   Modules accepted: Orders

## 2018-03-18 NOTE — Telephone Encounter (Signed)
Referral to neurosurgery placed

## 2018-03-21 NOTE — Telephone Encounter (Signed)
Per Dr. Primus Bravo patient does need to be seen with neurosurgery.

## 2018-04-08 DIAGNOSIS — M47816 Spondylosis without myelopathy or radiculopathy, lumbar region: Secondary | ICD-10-CM | POA: Diagnosis not present

## 2018-04-18 DIAGNOSIS — M19011 Primary osteoarthritis, right shoulder: Secondary | ICD-10-CM | POA: Diagnosis not present

## 2018-04-18 DIAGNOSIS — M13861 Other specified arthritis, right knee: Secondary | ICD-10-CM | POA: Diagnosis not present

## 2018-04-18 DIAGNOSIS — Z5181 Encounter for therapeutic drug level monitoring: Secondary | ICD-10-CM | POA: Diagnosis not present

## 2018-04-18 DIAGNOSIS — M479 Spondylosis, unspecified: Secondary | ICD-10-CM | POA: Diagnosis not present

## 2018-04-26 DIAGNOSIS — M25512 Pain in left shoulder: Secondary | ICD-10-CM | POA: Diagnosis not present

## 2018-04-26 DIAGNOSIS — M545 Low back pain: Secondary | ICD-10-CM | POA: Diagnosis not present

## 2018-04-26 DIAGNOSIS — M25511 Pain in right shoulder: Secondary | ICD-10-CM | POA: Diagnosis not present

## 2018-04-26 DIAGNOSIS — G8929 Other chronic pain: Secondary | ICD-10-CM | POA: Diagnosis not present

## 2018-05-11 DIAGNOSIS — M47816 Spondylosis without myelopathy or radiculopathy, lumbar region: Secondary | ICD-10-CM | POA: Diagnosis not present

## 2018-05-16 DIAGNOSIS — M479 Spondylosis, unspecified: Secondary | ICD-10-CM | POA: Diagnosis not present

## 2018-05-16 DIAGNOSIS — Z5181 Encounter for therapeutic drug level monitoring: Secondary | ICD-10-CM | POA: Diagnosis not present

## 2018-05-16 DIAGNOSIS — M19011 Primary osteoarthritis, right shoulder: Secondary | ICD-10-CM | POA: Diagnosis not present

## 2018-05-16 DIAGNOSIS — M13861 Other specified arthritis, right knee: Secondary | ICD-10-CM | POA: Diagnosis not present

## 2018-06-07 ENCOUNTER — Ambulatory Visit (INDEPENDENT_AMBULATORY_CARE_PROVIDER_SITE_OTHER): Payer: Medicare HMO | Admitting: Physician Assistant

## 2018-06-07 ENCOUNTER — Encounter: Payer: Self-pay | Admitting: Physician Assistant

## 2018-06-07 VITALS — BP 114/90 | HR 84 | Temp 98.3°F | Resp 16 | Wt 205.6 lb

## 2018-06-07 DIAGNOSIS — Z23 Encounter for immunization: Secondary | ICD-10-CM

## 2018-06-07 DIAGNOSIS — E7800 Pure hypercholesterolemia, unspecified: Secondary | ICD-10-CM | POA: Diagnosis not present

## 2018-06-07 DIAGNOSIS — F329 Major depressive disorder, single episode, unspecified: Secondary | ICD-10-CM | POA: Diagnosis not present

## 2018-06-07 DIAGNOSIS — R69 Illness, unspecified: Secondary | ICD-10-CM | POA: Diagnosis not present

## 2018-06-07 DIAGNOSIS — I10 Essential (primary) hypertension: Secondary | ICD-10-CM

## 2018-06-07 DIAGNOSIS — F32A Depression, unspecified: Secondary | ICD-10-CM

## 2018-06-07 MED ORDER — ATORVASTATIN CALCIUM 10 MG PO TABS: 10.0000 mg | ORAL_TABLET | Freq: Every day | ORAL | 0 refills | Status: DC

## 2018-06-07 MED ORDER — SERTRALINE HCL 100 MG PO TABS: 200.0000 mg | ORAL_TABLET | Freq: Every day | ORAL | 0 refills | Status: DC

## 2018-06-07 MED ORDER — LIRAGLUTIDE -WEIGHT MANAGEMENT 18 MG/3ML ~~LOC~~ SOPN: 0.6000 mg | PEN_INJECTOR | Freq: Every day | SUBCUTANEOUS | 0 refills | Status: AC

## 2018-06-07 MED ORDER — AMLODIPINE BESYLATE 10 MG PO TABS: 10.0000 mg | ORAL_TABLET | Freq: Every day | ORAL | 0 refills | Status: DC

## 2018-06-07 NOTE — Progress Notes (Signed)
Established Patient Office Visit  Subjective:  Patient ID: Carrie Alvarez, female    DOB: 05-17-1951  Age: 67 y.o. MRN: [id number]  CC:  Chief Complaint  Patient presents with  . Hypertension   SAXENDA!!!!!!!!!!!!!  HPI Carrie Alvarez presents for   Hypertension, follow-up:  BP Readings from Last 3 Encounters:  06/07/18 114/90  03/04/18 128/72  01/04/18 124/90    She was last seen for hypertension 3 months ago.  BP at that visit was 128/72. Management changes since that visit include no changes. She reports excellent compliance with treatment. She is not having side effects.  She is exercising. She is adherent to low salt diet.   Outside blood pressures are not being checked. She is experiencing none.  Patient denies chest pain.   Cardiovascular risk factors include advanced age (older than 61 for men, 41 for women), hypertension, obesity (BMI >= 30 kg/m2) and smoking/ tobacco exposure.  Use of agents associated with hypertension: none.     Weight trend: stable Wt Readings from Last 3 Encounters:  06/07/18 205 lb 9.6 oz (93.3 kg)  03/04/18 204 lb (92.5 kg)  01/04/18 204 lb 5 oz (92.7 kg)    Current diet: in general, a "healthy" diet    Depression: Continues to take zoloft 100 mg daily.   HLD: Continues lipitor 10 mg daily.  Lipid Panel     Component Value Date/Time   CHOL 170 10/27/2017 0927   TRIG 88 10/27/2017 0927   HDL 68 10/27/2017 0927   CHOLHDL 2.5 10/27/2017 0927   LDLCALC 84 10/27/2017 0927   Elevated scr: Had been taking a lot of Meloxicam and kidney function declined. Will check levels today.   Morbid obesity: obesity complicated by HTN and HLD. Wants to pursue weight loss. No history or family history of thyroid cancer, no history of pancreatitis.   ------------------------------------------------------------------------    Past Medical History:  Diagnosis Date  . Allergy   . Anemia   . Back pain   . Breast cancer  ([place]) 2015   LT LUMPECTOMY 12-2014 FOLLOWING CHEMO  . Carpal tunnel syndrome   . Depression   . GERD (gastroesophageal reflux disease)   . Hypertension   . Obesity   . Uterine cancer ([place]) 1994    Past Surgical History:  Procedure Laterality Date  . ABDOMINAL HYSTERECTOMY  1994   UTERINE CA  . AXILLARY LYMPH NODE BIOPSY Left 12/18/2014   Procedure: AXILLARY LYMPH NODE BIOPSY/;  Surgeon: Robert Bellow, MD;  Location: [facility] ORS;  Service: General;  Laterality: Left;  . AXILLARY LYMPH NODE DISSECTION Left 12/18/2014   Procedure: AXILLARY LYMPH NODE DISSECTION;  Surgeon: Robert Bellow, MD;  Location: [facility] OR[facility];  Service: General;  Laterality: Left;  . BREAST BIOPSY Left 05/2014   CORE - POS  . BREAST SURGERY    . CHOLECYSTECTOMY N/A 04/19/2016   Procedure: LAPAROSCOPIC CHOLECYSTECTOMY WITH INTRAOPERATIVE CHOLANGIOGRAM;  Surgeon: Hubbard Robinson, MD;  Location: [facility] OR[facility];  Service: General;  Laterality: N/A;  . medial branch block  11/06/2015   lumbar facet Dr. Primus Bravo  . SENTINEL NODE BIOPSY Left 12/18/2014   Procedure: SENTINEL NODE BIOPSY;  Surgeon: Robert Bellow, MD;  Location: [facility];  Service: General;  Laterality: Left;    Family History  Problem Relation Age of Onset  . Breast cancer Sister 65  . Colon cancer Sister   . Colon cancer Mother   . Hypertension Mother   . Asthma Mother   .  Cancer Father     Social History   Socioeconomic History  . Marital status: Married    Spouse name: Not on file  . Number of children: 3  . Years of education: Not on file  . Highest education level: Some college, no degree  Occupational History  . 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 Financial resource strain: Not hard at all  . Food insecurity:    Worry: Never true    Inability: Never true  . Transportation needs:    Medical: No    Non-medical: No  Tobacco Use  . Smoking status: Former Smoker    Packs/day: 0.50    Types:  Cigarettes    Last attempt to quit: 07/18/2014    Years since quitting: 3.8  . Smokeless tobacco: Never Used  . Tobacco comment: given smoking and pain information   Substance and Sexual Activity  . Alcohol use: No    Alcohol/week: 0.0 standard drinks  . Drug use: No  . Sexual activity: Not on file  Lifestyle  . Physical activity:    Days per week: Not on file    Minutes per session: Not on file  . Stress: To some extent  Relationships  . Social connections:    Talks on phone: Not on file    Gets together: Not on file    Attends religious service: Not on file    Active member of club or organization: Not on file    Attends meetings of clubs or organizations: Not on file    Relationship status: Not on file  . Intimate partner violence:    Fear of current or ex partner: Not on file    Emotionally abused: Not on file    Physically abused: Not on file    Forced sexual activity: Not on file  Other Topics Concern  . Not on file  Social History Narrative  . Not on file    Outpatient Medications Prior to Visit  Medication Sig Dispense Refill  . acetaminophen (TYLENOL) 500 MG tablet Take 500 mg by mouth every 6 (six) hours as needed for mild pain or moderate pain.     Marland Kitchen albuterol (PROVENTIL HFA;VENTOLIN HFA) 108 (90 Base) MCG/ACT inhaler Inhale 2 puffs into the lungs every 6 (six) hours as needed for wheezing or shortness of breath. 1 Inhaler 2  . amLODipine (NORVASC) 10 MG tablet Take 1 tablet (10 mg total) by mouth daily. 90 tablet 0  . atorvastatin (LIPITOR) 10 MG tablet Take 1 tablet (10 mg total) by mouth daily. 90 tablet 0  . baclofen (LIORESAL) 10 MG tablet Take 1 tablet (10 mg total) by mouth 2 (two) times daily. 30 each 0  . cholecalciferol (VITAMIN D) 1000 units tablet Take 1,000 Units by mouth daily.    Marland Kitchen gabapentin (NEURONTIN) 300 MG capsule Take 300 mg by mouth daily as needed (neuropathic pain).     Marland Kitchen letrozole (FEMARA) 2.5 MG tablet Take 1 tablet (2.5 mg total) by mouth  daily. 90 tablet 3  . pyridoxine (B-6) 100 MG tablet Take 100 mg by mouth daily.    . traMADol (ULTRAM) 50 MG tablet Take by mouth.    . vitamin B-12 (CYANOCOBALAMIN) 500 MCG tablet Take 500 mcg by mouth daily.    . fluticasone (FLONASE) 50 MCG/ACT nasal spray Place 2 sprays into both nostrils as needed for allergies or rhinitis. 16 g 0  . sertraline (ZOLOFT)  100 MG tablet Take 2 tablets (200 mg total) by mouth daily. 180 tablet 0  . cyclobenzaprine (FLEXERIL) 5 MG tablet Take 5 mg by mouth daily as needed.     . meloxicam (MOBIC) 15 MG tablet TAKE 1 TABLET BY MOUTH ONCE DAILY 90 tablet 1  . traMADol (ULTRAM) 50 MG tablet Take 50 mg by mouth 4 (four) times daily.      Facility-Administered Medications Prior to Visit  Medication Dose Route Frequency Provider Last Rate Last Dose  . heparin lock flush 100 unit/mL  500 Units Intravenous Once Grayland Ormond, Kathlene November, MD        No Known Allergies  ROS Review of Systems  Constitutional: Negative.   Respiratory: Negative.   Cardiovascular: Negative.       Objective:    Physical Exam  BP 114/90 (BP Location: Right Arm, Patient Position: Sitting, Cuff Size: Large)   Pulse 84   Temp 98.3 F (36.8 C) (Oral)   Resp 16   Wt 205 lb 9.6 oz (93.3 kg)   SpO2 99%   BMI 36.42 kg/m  Wt Readings from Last 3 Encounters:  06/07/18 205 lb 9.6 oz (93.3 kg)  03/04/18 204 lb (92.5 kg)  01/04/18 204 lb 5 oz (92.7 kg)     Health Maintenance Due  Topic Date Due  . INFLUENZA VACCINE  02/10/2018    There are no preventive care reminders to display for this patient.  Lab Results  Component Value Date   TSH 1.650 09/27/2015   Lab Results  Component Value Date   WBC 15.5 (H) 09/20/2017   HGB 14.1 09/20/2017   HCT 42.6 09/20/2017   MCV 82.8 09/20/2017   PLT 215 09/20/2017   Lab Results  Component Value Date   NA 144 03/04/2018   K 4.1 03/04/2018   CO2 23 03/04/2018   GLUCOSE 80 03/04/2018   BUN 20 03/04/2018   CREATININE 1.22 (H)  03/04/2018   BILITOT 0.5 03/04/2018   ALKPHOS 97 03/04/2018   AST 16 03/04/2018   ALT 12 03/04/2018   PROT 6.8 03/04/2018   ALBUMIN 4.2 03/04/2018   CALCIUM 9.7 03/04/2018   ANIONGAP 12 09/20/2017   Lab Results  Component Value Date   CHOL 170 10/27/2017   Lab Results  Component Value Date   HDL 68 10/27/2017   Lab Results  Component Value Date   LDLCALC 84 10/27/2017   Lab Results  Component Value Date   TRIG 88 10/27/2017   Lab Results  Component Value Date   CHOLHDL 2.5 10/27/2017   No results found for: HGBA1C    Assessment & Plan:  1. Essential hypertension  Continue amlodipine, recheck BP after stopping meloxicam.  - Comprehensive Metabolic Panel (CMET) - amLODipine (NORVASC) 10 MG tablet; Take 1 tablet (10 mg total) by mouth daily.  Dispense: 90 tablet; Refill: 0  2. Depression, unspecified depression type  - sertraline (ZOLOFT) 100 MG tablet; Take 2 tablets (200 mg total) by mouth daily.  Dispense: 180 tablet; Refill: 0  3. Hypercholesteremia  - atorvastatin (LIPITOR) 10 MG tablet; Take 1 tablet (10 mg total) by mouth daily.  Dispense: 90 tablet; Refill: 0  4. Need for influenza vaccination  - Flu vaccine HIGH DOSE PF  5. Morbid obesity ([place])  Will try to order saxenda. Phentermine not good with her HTN.  Follow-up: No follow-ups on file.    Shawna Orleans, CMA

## 2018-06-08 ENCOUNTER — Telehealth: Payer: Self-pay

## 2018-06-08 LAB — COMPREHENSIVE METABOLIC PANEL
ALT: 9 IU/L (ref 0–32)
AST: 14 IU/L (ref 0–40)
Albumin/Globulin Ratio: 1.7 (ref 1.2–2.2)
Albumin: 4.3 g/dL (ref 3.6–4.8)
Alkaline Phosphatase: 112 IU/L (ref 39–117)
BUN/Creatinine Ratio: 13 (ref 12–28)
BUN: 16 mg/dL (ref 8–27)
Bilirubin Total: 0.3 mg/dL (ref 0.0–1.2)
CO2: 22 mmol/L (ref 20–29)
Calcium: 9.9 mg/dL (ref 8.7–10.3)
Chloride: 103 mmol/L (ref 96–106)
Creatinine, Ser: 1.23 mg/dL — ABNORMAL HIGH (ref 0.57–1.00)
GFR calc Af Amer: 52 mL/min/{1.73_m2} — ABNORMAL LOW (ref >59)
GFR calc non Af Amer: 45 mL/min/{1.73_m2} — ABNORMAL LOW (ref >59)
Globulin, Total: 2.6 g/dL (ref 1.5–4.5)
Glucose: 86 mg/dL (ref 65–99)
Potassium: 4.6 mmol/L (ref 3.5–5.2)
Sodium: 142 mmol/L (ref 134–144)
Total Protein: 6.9 g/dL (ref 6.0–8.5)

## 2018-06-08 NOTE — Telephone Encounter (Signed)
Patient was advised.  

## 2018-06-08 NOTE — Telephone Encounter (Signed)
-----   Message from Trinna Post, [place] sent at 06/08/2018  1:54 PM EST ----- Her kidney function remains slightly decreased. This may be her new normal of kidney function. Would continue to avoid anti-inflammatories like ibuprofen, naproxen, and meloxicam or mobic.

## 2018-06-14 DIAGNOSIS — R69 Illness, unspecified: Secondary | ICD-10-CM | POA: Diagnosis not present

## 2018-06-22 DIAGNOSIS — M19011 Primary osteoarthritis, right shoulder: Secondary | ICD-10-CM | POA: Diagnosis not present

## 2018-06-22 DIAGNOSIS — M13861 Other specified arthritis, right knee: Secondary | ICD-10-CM | POA: Diagnosis not present

## 2018-06-22 DIAGNOSIS — M479 Spondylosis, unspecified: Secondary | ICD-10-CM | POA: Diagnosis not present

## 2018-06-22 DIAGNOSIS — Z5181 Encounter for therapeutic drug level monitoring: Secondary | ICD-10-CM | POA: Diagnosis not present

## 2018-06-29 ENCOUNTER — Ambulatory Visit
Admission: RE | Admit: 2018-06-29 | Discharge: 2018-06-29 | Disposition: A | Discharge status: Home / Self Care | Payer: Medicare HMO | Source: Ambulatory Visit | Attending: Oncology | Admitting: Oncology

## 2018-06-29 DIAGNOSIS — R922 Inconclusive mammogram: Secondary | ICD-10-CM | POA: Diagnosis not present

## 2018-06-29 DIAGNOSIS — C50912 Malignant neoplasm of unspecified site of left female breast: Secondary | ICD-10-CM | POA: Diagnosis not present

## 2018-06-29 DIAGNOSIS — C773 Secondary and unspecified malignant neoplasm of axilla and upper limb lymph nodes: Secondary | ICD-10-CM | POA: Insufficient documentation

## 2018-06-29 HISTORY — DX: Personal history of antineoplastic chemotherapy: Z92.21

## 2018-07-13 DIAGNOSIS — C189 Malignant neoplasm of colon, unspecified: Secondary | ICD-10-CM

## 2018-07-13 HISTORY — DX: Malignant neoplasm of colon, unspecified: C18.9

## 2018-07-14 DIAGNOSIS — M479 Spondylosis, unspecified: Secondary | ICD-10-CM | POA: Diagnosis not present

## 2018-07-14 DIAGNOSIS — M19011 Primary osteoarthritis, right shoulder: Secondary | ICD-10-CM | POA: Diagnosis not present

## 2018-07-14 DIAGNOSIS — M13861 Other specified arthritis, right knee: Secondary | ICD-10-CM | POA: Diagnosis not present

## 2018-07-14 DIAGNOSIS — Z5181 Encounter for therapeutic drug level monitoring: Secondary | ICD-10-CM | POA: Diagnosis not present

## 2018-07-15 DIAGNOSIS — R69 Illness, unspecified: Secondary | ICD-10-CM | POA: Diagnosis not present

## 2018-07-15 NOTE — Progress Notes (Signed)
**Note Carrie-Identified via Obfuscation** Carrie Alvarez  [phone] [phone]) [phone]  ID: Norm Salt OB: 12-25-1950  MR#: [id number]  QIO#:[id number]  Patient Care Team: Paulene Floor as PCP - General (Physician Assistant) [name], MD as Consulting Physician (Oncology) [name], MD as Attending Physician (Pain Medicine) [name]., MD as Consulting Physician (Ophthalmology)  CHIEF COMPLAINT: Stage IIa ER+ left breast cancer with no breast lesion and axillary lymph node metastasis.  INTERVAL HISTORY: Patient returns to clinic today for routine 9-monthevaluation.  She continues to feel well and remains asymptomatic.  She continues to have hot flashes, but otherwise is tolerating letrozole well.  She continues to have a mild peripheral neuropathy, but no other neurologic complaints.  She has a good appetite and denies weight loss.  She denies any pain.  She denies any recent fevers. She has no chest pain or shortness of breath. She denies any nausea, vomiting, constipation, or diarrhea.  She has no urinary complaints.  Patient feels at her baseline offers no specific complaints today.  REVIEW OF SYSTEMS:   Review of Systems  Constitutional: Negative.  Negative for fever, malaise/fatigue and weight loss.  Respiratory: Negative.  Negative for cough and shortness of breath.   Cardiovascular: Negative.  Negative for chest pain and leg swelling.  Gastrointestinal: Negative.  Negative for abdominal pain.  Genitourinary: Negative.  Negative for dysuria.  Musculoskeletal: Negative.  Negative for back pain.  Skin: Negative.  Negative for rash.  Neurological: Positive for sensory change. Negative for dizziness, focal weakness, weakness and headaches.  Psychiatric/Behavioral: Negative.  Negative for depression. The patient is not nervous/anxious.     As per HPI. Otherwise, a complete review of systems is negative.  PAST MEDICAL HISTORY: Past Medical History:    Diagnosis Date  . Allergy   . Anemia   . Back pain   . [place]   LT LUMPECTOMY 12-2014 FOLLOWING CHEMO  . Carpal tunnel syndrome   . Depression   . GERD (gastroesophageal reflux disease)   . Hypertension   . Obesity   . Personal history of chemotherapy 2016  . Uterine cancer ([place] 1994    PAST SURGICAL HISTORY: Past Surgical History:  Procedure Laterality Date  . ABDOMINAL HYSTERECTOMY  1994   UTERINE CA  . AXILLARY LYMPH NODE BIOPSY Left 12/18/2014   Procedure: AXILLARY LYMPH NODE BIOPSY/;  Surgeon: JRobert Bellow MD;  Location: [facility];  Service: General;  Laterality: Left;  . AXILLARY LYMPH NODE DISSECTION Left 12/18/2014   Procedure: AXILLARY LYMPH NODE DISSECTION;  Surgeon: JRobert Bellow MD;  Location: [facility];  Service: General;  Laterality: Left;  . BREAST BIOPSY Left 05/2014   CORE BX OF LN, METASTATIC ADENOCARCINOMA  . BREAST LUMPECTOMY Left 12/2014   CHEMO FIRST THEN SURGERY OF LN  . BREAST SURGERY    . CHOLECYSTECTOMY N/A 04/19/2016   Procedure: LAPAROSCOPIC CHOLECYSTECTOMY WITH INTRAOPERATIVE CHOLANGIOGRAM;  Surgeon: CHubbard Robinson MD;  Location: [facility];  Service: General;  Laterality: N/A;  . medial branch block  11/06/2015   lumbar facet Dr. CPrimus Bravo . SENTINEL NODE BIOPSY Left 12/18/2014   Procedure: SENTINEL NODE BIOPSY;  Surgeon: JRobert Bellow MD;  Location: [facility];  Service: General;  Laterality: Left;    FAMILY HISTORY Family History  Problem Relation Age of Onset  . Breast cancer Sister 518 . Colon cancer Sister   . Colon cancer Mother   . Hypertension Mother   . Asthma Mother   .  Cancer Father        ADVANCED DIRECTIVES:    HEALTH MAINTENANCE: Social History   Tobacco Use  . Smoking status: Former Smoker    Packs/day: 0.50    Types: Cigarettes    Last attempt to quit: 07/18/2014    Years since quitting: 4.0  . Smokeless tobacco: Never Used  . Tobacco comment: given smoking and pain information    Substance Use Topics  . Alcohol use: No    Alcohol/week: 0.0 standard drinks  . Drug use: No    No Known Allergies  Current Outpatient Medications  Medication Sig Dispense Refill  . acetaminophen (TYLENOL) 500 MG tablet Take 500 mg by mouth every 6 (six) hours as needed for mild pain or moderate pain.     Marland Kitchen albuterol (PROVENTIL HFA;VENTOLIN HFA) 108 (90 Base) MCG/ACT inhaler Inhale 2 puffs into the lungs every 6 (six) hours as needed for wheezing or shortness of breath. 1 Inhaler 2  . amLODipine (NORVASC) 10 MG tablet Take 1 tablet (10 mg total) by mouth daily. 90 tablet 0  . atorvastatin (LIPITOR) 10 MG tablet Take 1 tablet (10 mg total) by mouth daily. 90 tablet 0  . baclofen (LIORESAL) 10 MG tablet Take 1 tablet (10 mg total) by mouth 2 (two) times daily. 30 each 0  . cholecalciferol (VITAMIN D) 1000 units tablet Take 1,000 Units by mouth daily.    . fluticasone (FLONASE) 50 MCG/ACT nasal spray Place 2 sprays into both nostrils as needed for allergies or rhinitis. 16 g 0  . gabapentin (NEURONTIN) 300 MG capsule Take 300 mg by mouth daily as needed (neuropathic pain).     Marland Kitchen letrozole (FEMARA) 2.5 MG tablet Take 1 tablet (2.5 mg total) by mouth daily. 90 tablet 3  . Liraglutide -Weight Management (SAXENDA) 18 MG/3ML SOPN Inject 0.6 mg into the skin daily. 3 pen 0  . oxyCODONE (OXY IR/ROXICODONE) 5 MG immediate release tablet Take 5 mg by mouth every 4 (four) hours as needed for severe pain.    Marland Kitchen pyridoxine (B-6) 100 MG tablet Take 100 mg by mouth daily.    . sertraline (ZOLOFT) 100 MG tablet Take 2 tablets (200 mg total) by mouth daily. 180 tablet 0  . vitamin B-12 (CYANOCOBALAMIN) 500 MCG tablet Take 500 mcg by mouth daily.     No current facility-administered medications for this visit.    Facility-Administered Medications Ordered in Other Visits  Medication Dose Route Frequency Provider Last Rate Last Dose  . heparin lock flush 100 unit/mL  500 Units Intravenous Once [name], MD        OBJECTIVE: Vitals:   07/21/18 1433  BP: 125/90  Pulse: 71  Temp: (!) 97.4 F (36.3 C)     Body mass index is 36.49 kg/m.    ECOG FS:1 - Symptomatic but completely ambulatory  General: Well-developed, well-nourished, no acute distress. Eyes: Pink conjunctiva, anicteric sclera. HEENT: Normocephalic, moist mucous membranes. Breast: Patient declined breast exam today. Lungs: Clear to auscultation bilaterally. Heart: Regular rate and rhythm. No rubs, murmurs, or gallops. Abdomen: Soft, nontender, nondistended. No organomegaly noted, normoactive bowel sounds. Musculoskeletal: No edema, cyanosis, or clubbing. Neuro: Alert, answering all questions appropriately. Cranial nerves grossly intact. Skin: No rashes or petechiae noted. Psych: Normal affect.  LAB RESULTS:  Lab Results  Component Value Date   NA 142 06/07/2018   K 4.6 06/07/2018   CL 103 06/07/2018   CO2 22 06/07/2018   GLUCOSE 86 06/07/2018   BUN  16 06/07/2018   CREATININE 1.23 (H) 06/07/2018   CALCIUM 9.9 06/07/2018   PROT 6.9 06/07/2018   ALBUMIN 4.3 06/07/2018   AST 14 06/07/2018   ALT 9 06/07/2018   ALKPHOS 112 06/07/2018   BILITOT 0.3 06/07/2018   GFRNONAA 45 (L) 06/07/2018   GFRAA 52 (L) 06/07/2018    Lab Results  Component Value Date   WBC 15.5 (H) 09/20/2017   NEUTROABS 5.7 12/28/2016   HGB 14.1 09/20/2017   HCT 42.6 09/20/2017   MCV 82.8 09/20/2017   PLT 215 09/20/2017     STUDIES: Mm Diag Breast Tomo Bilateral  Result Date: 06/29/2018 CLINICAL DATA:  Patient has a history of metastatic breast carcinoma to a left axillary lymph node.No malignancy in either breast was found either on mammography, ultrasound or breast MRI. Patient underwent chemotherapy. No current breast complaints. EXAM: DIGITAL DIAGNOSTIC BILATERAL MAMMOGRAM WITH CAD AND TOMO COMPARISON:  Previous exam(s). ACR Breast Density Category c: The breast tissue is heterogeneously dense, which may obscure small  masses. FINDINGS: There are no discrete masses, areas of architectural distortion, areas of significant asymmetry or suspicious calcifications. No mammographic change. Mammographic images were processed with CAD. IMPRESSION: No evidence of breast malignancy. RECOMMENDATION: Screening mammogram in one year.(Code:SM-B-01Y) I have discussed the findings and recommendations with the patient. Results were also provided in writing at the conclusion of the visit. If applicable, a reminder letter will be sent to the patient regarding the next appointment. BI-RADS CATEGORY  1: Negative. Electronically Signed   By: Lajean Manes M.D.   On: 06/29/2018 11:30    ASSESSMENT: Stage IIa ER+ left breast cancer with no breast lesion and axillary lymph node metastasis. (TxN1M0)  HER-2 negative.   PLAN:   1. Stage IIa ER+ left breast cancer with no breast lesion and axillary lymph node metastasis: Despite no obvious breast lesion on mammogram or breast MRI, pathology and pattern of spread was consistent with primary breast cancer. CT and bone scan at time of diagnosis revealed no other evidence of malignancy.  She only received 3 cycles of Adriamycin and Cytoxan.  Taxol was discontinued early as well secondary to persistent peripheral neuropathy. Patient completed her chemotherapy on Nov 19, 2014.  She elected not to pursue axillary node dissection given the potential morbidity of this procedure. It was also elected not to pursue adjuvant XRT given there was no primary breast lesion.  Continue adjuvant letrozole for a total of 5 years completing in July 2021.  Continuing treatment for an additional 2 to 3 years was previously discussed, but patient did not expressed interest in extending treatment beyond 5 years.  Her most recent mammogram on June 29, 2018 was reported as BI-RADS 2.  Repeat in December 2020.  Return to clinic in 6 months for routine evaluation. 2.  Bone health: Patient's most recent bone mineral density on  February 03, 2018 reported T score of -0.7 which is unchanged from 3 years prior.  Consider repeat bone mineral density in July 2021.  2. Family history: Patient's sister reports she has positive for Lynch syndrome, but these results were never confirmed. Patient had a colonoscopy in October 2012 that was reported as normal.  There is no report of genetic testing.   3. Pain/Peripheral neuropathy: Chronic and unchanged. 4. Hot flashes: Likely secondary to letrozole.  Previously, patient was offered a change in treatment or treatment for her hot flashes, both of which she declined.     Patient expressed understanding and was in agreement  with this plan. She also understands that She can call clinic at any time with any questions, concerns, or complaints.   Breast cancer metastasized to axillary lymph node   Staging form: Breast, AJCC 7th Edition     Clinical stage from 10/22/2014: Stage Unknown ([place], N1, M0) - Signed by [name], MD on 10/22/2014   [name], MD   07/22/2018 2:53 PM

## 2018-07-21 ENCOUNTER — Inpatient Hospital Stay: Payer: Medicare HMO | Attending: Oncology | Admitting: Oncology

## 2018-07-21 ENCOUNTER — Other Ambulatory Visit: Payer: Self-pay

## 2018-07-21 VITALS — BP 125/90 | HR 71 | Temp 97.4°F | Ht 63.0 in | Wt 206.0 lb

## 2018-07-21 DIAGNOSIS — Z800 Family history of malignant neoplasm of digestive organs: Secondary | ICD-10-CM | POA: Insufficient documentation

## 2018-07-21 DIAGNOSIS — Z803 Family history of malignant neoplasm of breast: Secondary | ICD-10-CM | POA: Insufficient documentation

## 2018-07-21 DIAGNOSIS — C50919 Malignant neoplasm of unspecified site of unspecified female breast: Secondary | ICD-10-CM

## 2018-07-21 DIAGNOSIS — G629 Polyneuropathy, unspecified: Secondary | ICD-10-CM | POA: Diagnosis not present

## 2018-07-21 DIAGNOSIS — Z170 Estrogen receptor positive status [ER+]: Secondary | ICD-10-CM | POA: Insufficient documentation

## 2018-07-21 DIAGNOSIS — Z79811 Long term (current) use of aromatase inhibitors: Secondary | ICD-10-CM | POA: Diagnosis not present

## 2018-07-21 DIAGNOSIS — I10 Essential (primary) hypertension: Secondary | ICD-10-CM | POA: Diagnosis not present

## 2018-07-21 DIAGNOSIS — C50912 Malignant neoplasm of unspecified site of left female breast: Secondary | ICD-10-CM | POA: Insufficient documentation

## 2018-07-21 DIAGNOSIS — N951 Menopausal and female climacteric states: Secondary | ICD-10-CM

## 2018-07-21 DIAGNOSIS — C773 Secondary and unspecified malignant neoplasm of axilla and upper limb lymph nodes: Secondary | ICD-10-CM | POA: Insufficient documentation

## 2018-07-21 DIAGNOSIS — Z79899 Other long term (current) drug therapy: Secondary | ICD-10-CM | POA: Insufficient documentation

## 2018-07-21 DIAGNOSIS — Z87891 Personal history of nicotine dependence: Secondary | ICD-10-CM | POA: Insufficient documentation

## 2018-07-21 MED ORDER — LETROZOLE 2.5 MG PO TABS: 2.5000 mg | ORAL_TABLET | Freq: Every day | ORAL | 3 refills | Status: DC

## 2018-07-21 NOTE — Progress Notes (Signed)
Patient is here today to follow up on her Carcinoma of left breast metastatic to axillary lymph node. Patient stated that she had been doing well. Patient stated that she had been doing her monthly self-breast exams. Patient denied nipple discharge, skin discoloration, knots/lumps and pain. Patient had her last mammogram on 06/29/2018 and it was normal.

## 2018-07-26 DIAGNOSIS — M5416 Radiculopathy, lumbar region: Secondary | ICD-10-CM | POA: Diagnosis not present

## 2018-08-09 ENCOUNTER — Encounter: Payer: Self-pay | Admitting: Physician Assistant

## 2018-08-09 ENCOUNTER — Ambulatory Visit (INDEPENDENT_AMBULATORY_CARE_PROVIDER_SITE_OTHER): Payer: Medicare HMO | Admitting: Physician Assistant

## 2018-08-09 VITALS — BP 125/88 | HR 74 | Temp 98.5°F | Wt 209.0 lb

## 2018-08-09 DIAGNOSIS — B372 Candidiasis of skin and nail: Secondary | ICD-10-CM | POA: Diagnosis not present

## 2018-08-09 DIAGNOSIS — J0110 Acute frontal sinusitis, unspecified: Secondary | ICD-10-CM | POA: Diagnosis not present

## 2018-08-09 MED ORDER — NYSTATIN 100000 UNIT/GM EX POWD: Freq: Four times a day (QID) | CUTANEOUS | 0 refills | Status: DC

## 2018-08-09 MED ORDER — AMOXICILLIN 875 MG PO TABS: 875.0000 mg | ORAL_TABLET | Freq: Two times a day (BID) | ORAL | 0 refills | Status: AC

## 2018-08-09 MED ORDER — FEXOFENADINE HCL 180 MG PO TABS: 180.0000 mg | ORAL_TABLET | Freq: Every day | ORAL | 0 refills | Status: DC

## 2018-08-09 NOTE — Progress Notes (Signed)
Patient: Carrie Alvarez Female    DOB: 1951/04/05   68 y.o.   MRN: [id number] Visit Date: 08/10/2018  Today's Provider: Trinna Post, PA-C   Chief Complaint  Patient presents with  . URI   Subjective:    URI   The current episode started 1 to 4 weeks ago. The problem has been gradually worsening. There has been no fever. Associated symptoms include congestion, headaches and sinus pain. She has tried increased fluids for the symptoms.   Patient also reports rash in her groin that is red, painful and itchy. Sometimes drains.   No Known Allergies   Current Outpatient Medications:  .  acetaminophen (TYLENOL) 500 MG tablet, Take 500 mg by mouth every 6 (six) hours as needed for mild pain or moderate pain. , Disp: , Rfl:  .  albuterol (PROVENTIL HFA;VENTOLIN HFA) 108 (90 Base) MCG/ACT inhaler, Inhale 2 puffs into the lungs every 6 (six) hours as needed for wheezing or shortness of breath., Disp: 1 Inhaler, Rfl: 2 .  amLODipine (NORVASC) 10 MG tablet, Take 1 tablet (10 mg total) by mouth daily., Disp: 90 tablet, Rfl: 0 .  atorvastatin (LIPITOR) 10 MG tablet, Take 1 tablet (10 mg total) by mouth daily., Disp: 90 tablet, Rfl: 0 .  baclofen (LIORESAL) 10 MG tablet, Take 1 tablet (10 mg total) by mouth 2 (two) times daily., Disp: 30 each, Rfl: 0 .  cholecalciferol (VITAMIN D) 1000 units tablet, Take 1,000 Units by mouth daily., Disp: , Rfl:  .  gabapentin (NEURONTIN) 300 MG capsule, Take 300 mg by mouth daily as needed (neuropathic pain). , Disp: , Rfl:  .  letrozole (FEMARA) 2.5 MG tablet, Take 1 tablet (2.5 mg total) by mouth daily., Disp: 90 tablet, Rfl: 3 .  Liraglutide -Weight Management (SAXENDA) 18 MG/3ML SOPN, Inject 0.6 mg into the skin daily., Disp: 3 pen, Rfl: 0 .  oxyCODONE (OXY IR/ROXICODONE) 5 MG immediate release tablet, Take 5 mg by mouth every 4 (four) hours as needed for severe pain., Disp: , Rfl:  .  pyridoxine (B-6) 100 MG tablet, Take 100 mg by mouth  daily., Disp: , Rfl:  .  sertraline (ZOLOFT) 100 MG tablet, Take 2 tablets (200 mg total) by mouth daily., Disp: 180 tablet, Rfl: 0 .  vitamin B-12 (CYANOCOBALAMIN) 500 MCG tablet, Take 500 mcg by mouth daily., Disp: , Rfl:  .  amoxicillin (AMOXIL) 875 MG tablet, Take 1 tablet (875 mg total) by mouth 2 (two) times daily for 7 days., Disp: 14 tablet, Rfl: 0 .  fexofenadine (ALLEGRA ALLERGY) 180 MG tablet, Take 1 tablet (180 mg total) by mouth daily., Disp: 90 tablet, Rfl: 0 .  fluticasone (FLONASE) 50 MCG/ACT nasal spray, Place 2 sprays into both nostrils as needed for allergies or rhinitis., Disp: 16 g, Rfl: 0 .  nystatin (MYCOSTATIN/NYSTOP) powder, Apply topically 4 (four) times daily., Disp: 15 g, Rfl: 0 No current facility-administered medications for this visit.   Facility-Administered Medications Ordered in Other Visits:  .  heparin lock flush 100 unit/mL, 500 Units, Intravenous, Once, Finnegan, Kathlene November, MD  Review of Systems  HENT: Positive for congestion, sinus pressure and sinus pain.   Neurological: Positive for dizziness, light-headedness and headaches.    Social History   Tobacco Use  . Smoking status: Former Smoker    Packs/day: 0.50    Types: Cigarettes    Last attempt to quit: 07/18/2014    Years since quitting: 4.0  . Smokeless  tobacco: Never Used  . Tobacco comment: given smoking and pain information   Substance Use Topics  . Alcohol use: No    Alcohol/week: 0.0 standard drinks      Objective:   BP 125/88 (BP Location: Left Arm, Patient Position: Sitting, Cuff Size: Normal)   Pulse 74   Temp 98.5 F (36.9 C) (Oral)   Wt 209 lb (94.8 kg)   SpO2 97%   BMI 37.02 kg/m  Vitals:   08/09/18 1138  BP: 125/88  Pulse: 74  Temp: 98.5 F (36.9 C)  TempSrc: Oral  SpO2: 97%  Weight: 209 lb (94.8 kg)     Physical Exam Constitutional:      General: She is not in acute distress.    Appearance: She is well-developed. She is not diaphoretic.  HENT:     Right  Ear: External ear normal.     Left Ear: External ear normal.     Nose:     Right Sinus: Maxillary sinus tenderness and frontal sinus tenderness present.     Left Sinus: Maxillary sinus tenderness and frontal sinus tenderness present.     Mouth/Throat:     Pharynx: No oropharyngeal exudate or posterior oropharyngeal erythema.  Eyes:     General:        Right eye: No discharge.        Left eye: No discharge.     Conjunctiva/sclera: Conjunctivae normal.  Neck:     Musculoskeletal: Neck supple.  Cardiovascular:     Rate and Rhythm: Normal rate and regular rhythm.  Pulmonary:     Effort: Pulmonary effort is normal.     Breath sounds: Normal breath sounds.  Lymphadenopathy:     Cervical: Cervical adenopathy present.  Skin:    General: Skin is warm and dry.     Findings: Erythema and rash present.          Comments: Erythematous papules in bilateral inguinal folds.   Neurological:     Mental Status: She is alert and oriented to person, place, and time.  Psychiatric:        Behavior: Behavior normal.         Assessment & Plan    1. Acute non-recurrent frontal sinusitis  - amoxicillin (AMOXIL) 875 MG tablet; Take 1 tablet (875 mg total) by mouth 2 (two) times daily for 7 days.  Dispense: 14 tablet; Refill: 0 - fexofenadine (ALLEGRA ALLERGY) 180 MG tablet; Take 1 tablet (180 mg total) by mouth daily.  Dispense: 90 tablet; Refill: 0  2. Candidal intertrigo  - nystatin (MYCOSTATIN/NYSTOP) powder; Apply topically 4 (four) times daily.  Dispense: 15 g; Refill: 0  The entirety of the information documented in the History of Present Illness, Review of Systems and Physical Exam were personally obtained by me. Portions of this information were initially documented by [facility], CMA and reviewed by me for thoroughness and accuracy.    Return if symptoms worsen or fail to improve.     Trinna Post, PA-C  [facility]

## 2018-08-09 NOTE — Patient Instructions (Signed)

## 2018-08-15 DIAGNOSIS — M13861 Other specified arthritis, right knee: Secondary | ICD-10-CM | POA: Diagnosis not present

## 2018-08-15 DIAGNOSIS — M479 Spondylosis, unspecified: Secondary | ICD-10-CM | POA: Diagnosis not present

## 2018-08-15 DIAGNOSIS — G894 Chronic pain syndrome: Secondary | ICD-10-CM | POA: Diagnosis not present

## 2018-08-15 DIAGNOSIS — Z5181 Encounter for therapeutic drug level monitoring: Secondary | ICD-10-CM | POA: Diagnosis not present

## 2018-08-15 DIAGNOSIS — M19011 Primary osteoarthritis, right shoulder: Secondary | ICD-10-CM | POA: Diagnosis not present

## 2018-08-16 DIAGNOSIS — C50919 Malignant neoplasm of unspecified site of unspecified female breast: Secondary | ICD-10-CM | POA: Diagnosis not present

## 2018-08-16 DIAGNOSIS — H269 Unspecified cataract: Secondary | ICD-10-CM | POA: Diagnosis not present

## 2018-08-16 DIAGNOSIS — G8929 Other chronic pain: Secondary | ICD-10-CM | POA: Diagnosis not present

## 2018-08-16 DIAGNOSIS — F419 Anxiety disorder, unspecified: Secondary | ICD-10-CM | POA: Diagnosis not present

## 2018-08-16 DIAGNOSIS — E785 Hyperlipidemia, unspecified: Secondary | ICD-10-CM | POA: Diagnosis not present

## 2018-08-16 DIAGNOSIS — J309 Allergic rhinitis, unspecified: Secondary | ICD-10-CM | POA: Diagnosis not present

## 2018-08-16 DIAGNOSIS — I10 Essential (primary) hypertension: Secondary | ICD-10-CM | POA: Diagnosis not present

## 2018-08-16 DIAGNOSIS — R69 Illness, unspecified: Secondary | ICD-10-CM | POA: Diagnosis not present

## 2018-08-16 DIAGNOSIS — G629 Polyneuropathy, unspecified: Secondary | ICD-10-CM | POA: Diagnosis not present

## 2018-09-13 DIAGNOSIS — M9973 Connective tissue and disc stenosis of intervertebral foramina of lumbar region: Secondary | ICD-10-CM | POA: Diagnosis not present

## 2018-09-13 DIAGNOSIS — M4807 Spinal stenosis, lumbosacral region: Secondary | ICD-10-CM | POA: Diagnosis not present

## 2018-09-13 DIAGNOSIS — M48062 Spinal stenosis, lumbar region with neurogenic claudication: Secondary | ICD-10-CM | POA: Diagnosis not present

## 2018-09-13 DIAGNOSIS — M479 Spondylosis, unspecified: Secondary | ICD-10-CM | POA: Diagnosis not present

## 2018-09-13 DIAGNOSIS — M4726 Other spondylosis with radiculopathy, lumbar region: Secondary | ICD-10-CM | POA: Diagnosis not present

## 2018-09-13 DIAGNOSIS — M47897 Other spondylosis, lumbosacral region: Secondary | ICD-10-CM | POA: Diagnosis not present

## 2018-09-13 DIAGNOSIS — M545 Low back pain: Secondary | ICD-10-CM | POA: Diagnosis not present

## 2018-09-13 DIAGNOSIS — G8929 Other chronic pain: Secondary | ICD-10-CM | POA: Diagnosis not present

## 2018-09-13 DIAGNOSIS — M792 Neuralgia and neuritis, unspecified: Secondary | ICD-10-CM | POA: Diagnosis not present

## 2018-09-13 DIAGNOSIS — M9943 Connective tissue stenosis of neural canal of lumbar region: Secondary | ICD-10-CM | POA: Diagnosis not present

## 2018-09-13 DIAGNOSIS — G894 Chronic pain syndrome: Secondary | ICD-10-CM | POA: Diagnosis not present

## 2018-09-13 DIAGNOSIS — G5600 Carpal tunnel syndrome, unspecified upper limb: Secondary | ICD-10-CM | POA: Diagnosis not present

## 2018-09-30 ENCOUNTER — Ambulatory Visit (INDEPENDENT_AMBULATORY_CARE_PROVIDER_SITE_OTHER): Payer: Medicare HMO

## 2018-09-30 ENCOUNTER — Other Ambulatory Visit: Payer: Self-pay

## 2018-09-30 VITALS — BP 108/64 | HR 73 | Temp 98.6°F | Ht 63.0 in | Wt 209.8 lb

## 2018-09-30 DIAGNOSIS — Z0000 Encounter for general adult medical examination without abnormal findings: Secondary | ICD-10-CM

## 2018-09-30 NOTE — Progress Notes (Signed)
Subjective:   Carrie Alvarez is a 68 y.o. female who presents for Medicare Annual (Subsequent) preventive examination.  Review of Systems:  N/A  Cardiac Risk Factors include: advanced age (>57men, >66 women);dyslipidemia;hypertension;obesity (BMI >30kg/m2)     Objective:     Vitals: BP 108/64 (BP Location: Right Arm)   Pulse 73   Temp 98.6 F (37 C) (Oral)   Ht 5\' 3"  (1.6 m)   Wt 209 lb 12.8 oz (95.2 kg)   BMI 37.16 kg/m   Body mass index is 37.16 kg/m.  Advanced Directives 09/30/2018 07/21/2018 01/04/2018 09/28/2017 09/20/2017 06/29/2017 12/29/2016  Does Patient Have a Medical Advance Directive? No No No No No No No  Would patient like information on creating a medical advance directive? No - Patient declined No - Patient declined - Yes (MAU/Ambulatory/Procedural Areas - Information given) No - Patient declined - No - Patient declined    Tobacco Social History   Tobacco Use  Smoking Status Former Smoker  . Packs/day: 0.50  . Types: Cigarettes  . Last attempt to quit: 07/18/2014  . Years since quitting: 4.2  Smokeless Tobacco Never Used     Counseling given: Not Answered   Clinical Intake:  Pre-visit preparation completed: Yes  Pain : 0-10 Pain Score: 8  Pain Type: Chronic pain Pain Location: Back(and down left leg) Pain Orientation: Left, Lower Pain Descriptors / Indicators: Burning, Radiating Pain Frequency: Constant     Nutritional Status: BMI > 30  Obese Nutritional Risks: None Diabetes: No  How often do you need to have someone help you when you read instructions, pamphlets, or other written materials from your doctor or pharmacy?: 1 - Never  Interpreter Needed?: No  Information entered by :: [facility], LPN  Past Medical History:  Diagnosis Date  . Allergy   . Anemia   . Back pain   . Breast cancer ([place]) 2015   LT LUMPECTOMY 12-2014 FOLLOWING CHEMO  . Carpal tunnel syndrome   . Cataract   . Depression   . GERD (gastroesophageal reflux  disease)   . Hypertension   . Obesity   . Personal history of chemotherapy 2016  . Pinched nerve   . Uterine cancer ([place]) 1994   Past Surgical History:  Procedure Laterality Date  . ABDOMINAL HYSTERECTOMY  1994   UTERINE CA  . AXILLARY LYMPH NODE BIOPSY Left 12/18/2014   Procedure: AXILLARY LYMPH NODE BIOPSY/;  Surgeon: Robert Bellow, MD;  Location: [facility] ORS;  Service: General;  Laterality: Left;  . AXILLARY LYMPH NODE DISSECTION Left 12/18/2014   Procedure: AXILLARY LYMPH NODE DISSECTION;  Surgeon: Robert Bellow, MD;  Location: [facility] ORS;  Service: General;  Laterality: Left;  . BREAST BIOPSY Left 05/2014   CORE BX OF LN, METASTATIC ADENOCARCINOMA  . BREAST LUMPECTOMY Left 12/2014   CHEMO FIRST THEN SURGERY OF LN  . BREAST SURGERY    . CHOLECYSTECTOMY N/A 04/19/2016   Procedure: LAPAROSCOPIC CHOLECYSTECTOMY WITH INTRAOPERATIVE CHOLANGIOGRAM;  Surgeon: Hubbard Robinson, MD;  Location: [facility] ORS;  Service: General;  Laterality: N/A;  . medial branch block  11/06/2015   lumbar facet Dr. Primus Bravo  . SENTINEL NODE BIOPSY Left 12/18/2014   Procedure: SENTINEL NODE BIOPSY;  Surgeon: Robert Bellow, MD;  Location: [facility] ORS;  Service: General;  Laterality: Left;   Family History  Problem Relation Age of Onset  . Breast cancer Sister 32  . Colon cancer Sister   . Colon cancer Mother   . Hypertension Mother   .  Asthma Mother   . Cancer Father    Social History   Socioeconomic History  . Marital status: Married    Spouse name: Not on file  . Number of children: 3  . Years of education: Not on file  . Highest education level: Some college, no degree  Occupational History  . Occupation: retired    Comment: part time work @ a [employer]  . Financial resource strain: Somewhat hard  . Food insecurity:    Worry: Often true    Inability: Often true  . Transportation needs:    Medical: No    Non-medical: No  Tobacco Use  . Smoking status: Former Smoker    Packs/day:  0.50    Types: Cigarettes    Last attempt to quit: 07/18/2014    Years since quitting: 4.2  . Smokeless tobacco: Never Used  Substance and Sexual Activity  . Alcohol use: No    Alcohol/week: 0.0 standard drinks  . Drug use: No  . Sexual activity: Not on file  Lifestyle  . Physical activity:    Days per week: 0 days    Minutes per session: 0 min  . Stress: To some extent  Relationships  . Social connections:    Talks on phone: Patient refused    Gets together: Patient refused    Attends religious service: Patient refused    Active member of club or organization: Patient refused    Attends meetings of clubs or organizations: Patient refused    Relationship status: Patient refused  Other Topics Concern  . Not on file  Social History Narrative  . Not on file    Outpatient Encounter Medications as of 09/30/2018  Medication Sig  . acetaminophen (TYLENOL) 500 MG tablet Take 500 mg by mouth every 6 (six) hours as needed for mild pain or moderate pain.   Marland Kitchen albuterol (PROVENTIL HFA;VENTOLIN HFA) 108 (90 Base) MCG/ACT inhaler Inhale 2 puffs into the lungs every 6 (six) hours as needed for wheezing or shortness of breath.  Marland Kitchen amLODipine (NORVASC) 10 MG tablet Take 1 tablet (10 mg total) by mouth daily.  Marland Kitchen atorvastatin (LIPITOR) 10 MG tablet Take 1 tablet (10 mg total) by mouth daily.  . baclofen (LIORESAL) 10 MG tablet Take 1 tablet (10 mg total) by mouth 2 (two) times daily. (Patient taking differently: Take 10 mg by mouth 2 (two) times daily. Taking 3 times a day)  . cholecalciferol (VITAMIN D) 1000 units tablet Take 1,000 Units by mouth daily.  . fexofenadine (ALLEGRA ALLERGY) 180 MG tablet Take 1 tablet (180 mg total) by mouth daily.  . fluticasone (FLONASE) 50 MCG/ACT nasal spray Place 2 sprays into both nostrils as needed for allergies or rhinitis.  Marland Kitchen gabapentin (NEURONTIN) 300 MG capsule Take 300 mg by mouth daily as needed (neuropathic pain).   Marland Kitchen letrozole (FEMARA) 2.5 MG tablet Take  1 tablet (2.5 mg total) by mouth daily.  . Oxycodone HCl 10 MG TABS LIMIT ONE HALF TO ONE TABLET BY MOUTH 3 TO 5 TIMES DAILY IF TOLERATED. TABLET IS 10MG .  . pyridoxine (B-6) 100 MG tablet Take 100 mg by mouth daily.  . sertraline (ZOLOFT) 100 MG tablet Take 2 tablets (200 mg total) by mouth daily.  . vitamin B-12 (CYANOCOBALAMIN) 500 MCG tablet Take 500 mcg by mouth daily.  Marland Kitchen nystatin (MYCOSTATIN/NYSTOP) powder Apply topically 4 (four) times daily. (Patient not taking: Reported on 09/30/2018)  . oxyCODONE (OXY IR/ROXICODONE) 5 MG immediate release tablet Take 5-10  mg by mouth as directed. Takes half to 1 tablet 3-5xs daily as tolerated.   Facility-Administered Encounter Medications as of 09/30/2018  Medication  . heparin lock flush 100 unit/mL    Activities of Daily Living In your present state of health, do you have any difficulty performing the following activities: 09/30/2018  Hearing? N  Vision? Y  Comment Due to cataracts.   Difficulty concentrating or making decisions? N  Walking or climbing stairs? N  Dressing or bathing? N  Doing errands, shopping? N  Preparing Food and eating ? N  Using the Toilet? N  In the past six months, have you accidently leaked urine? N  Do you have problems with loss of bowel control? N  Managing your Medications? N  Managing your Finances? N  Housekeeping or managing your Housekeeping? N  Some recent data might be hidden    Patient Care Team: Paulene Floor as PCP - General (Physician Assistant) [name], MD as Consulting Physician (Oncology) [name], MD as Attending Physician (Pain Medicine) [name]., MD as Consulting Physician (Ophthalmology)    Assessment:   This is a routine wellness examination for Tiera.  Exercise Activities and Dietary recommendations Current Exercise Habits: The patient does not participate in regular exercise at present, Exercise limited by: neurologic condition(s);orthopedic  condition(s)  Goals    . DIET - INCREASE WATER INTAKE     Recommend increasing water intake to 6-8 glasses a day.    . Prevent falls     Recommend to remove any items from the home that may cause slips or trips.       Fall Risk: Fall Risk  09/30/2018 09/28/2017 09/22/2016 03/04/2016 02/24/2016  Falls in the past year? 1 Yes Yes No No  Comment - - - - -  Number falls in past yr: 0 2 or more 1 - -  Injury with Fall? 0 No No - -  Risk for fall due to : - - - - -  Follow up Falls prevention discussed Falls prevention discussed Falls prevention discussed - -    FALL RISK PREVENTION PERTAINING TO THE HOME:  Any stairs in or around the home? Yes  If so, are there any without handrails? No   Home free of loose throw rugs in walkways, pet beds, electrical cords, etc? Yes  Adequate lighting in your home to reduce risk of falls? Yes   ASSISTIVE DEVICES UTILIZED TO PREVENT FALLS:  Life alert? No  Use of a cane, walker or w/c? Yes  Grab bars in the bathroom? No  Shower chair or bench in shower? No  Elevated toilet seat or a handicapped toilet? No    TIMED UP AND GO:  Was the test performed? No .    Depression Screen PHQ 2/9 Scores 09/30/2018 09/30/2018 09/28/2017 10/27/2016  PHQ - 2 Score 0 0 1 0  PHQ- 9 Score 2 - - -  Exception Documentation - - - -     Cognitive Function: Declined today.      6CIT Screen 09/22/2016  What Year? 0 points  What month? 0 points  What time? 0 points  Count back from 20 0 points  Months in reverse 0 points  Repeat phrase 0 points  Total Score 0    Immunization History  Administered Date(s) Administered  . Influenza, High Dose Seasonal PF 06/07/2017, 06/07/2018  . Influenza,inj,Quad PF,6+ Mos 04/20/2016  . Pneumococcal Conjugate-13 09/22/2016  . Pneumococcal Polysaccharide-23 09/28/2017    Qualifies  for Shingles Vaccine? Yes . Due for Shingrix. Education has been provided regarding the importance of this vaccine. Pt has been advised to  call insurance company to determine out of pocket expense. Advised may also receive vaccine at local pharmacy or Health Dept. Verbalized acceptance and understanding.  Tdap: Although this vaccine is not a covered service during a Wellness Exam, does the patient still wish to receive this vaccine today?  No .  Education has been provided regarding the importance of this vaccine. Advised may receive this vaccine at local pharmacy or Health Dept. Aware to provide a copy of the vaccination record if obtained from local pharmacy or Health Dept. Verbalized acceptance and understanding.  Flu Vaccine: Up to date  Pneumococcal Vaccine: Up to date  Screening Tests Health Maintenance  Topic Date Due  . TETANUS/TDAP  07/13/2026 (Originally 09/21/1969)  . MAMMOGRAM  06/29/2020  . COLONOSCOPY  04/23/2021  . INFLUENZA VACCINE  Completed  . DEXA SCAN  Completed  . Hepatitis C Screening  Completed  . PNA vac Low Risk Adult  Completed    Cancer Screenings:  Colorectal Screening: Completed 04/24/11. Repeat every 8 years.  Mammogram: Completed 06/29/18.   Bone Density: Completed 02/03/18. Results reflect NORMAL. Repeat every 10 years.   Lung Cancer Screening: (Low Dose CT Chest recommended if Age 30-80 years, 30 pack-year currently smoking OR have quit w/in 15years.) does qualify, however declines order today.   Additional Screening:  Hepatitis C Screening: Up to date  Vision Screening: Recommended annual ophthalmology exams for early detection of glaucoma and other disorders of the eye.  Dental Screening: Recommended annual dental exams for proper oral hygiene  Community Resource Referral:  CRR required this visit?  No       Plan:  I have personally reviewed and addressed the Medicare Annual Wellness questionnaire and have noted the following in the patient's chart:  A. Medical and social history B. Use of alcohol, tobacco or illicit drugs  C. Current medications and  supplements D. Functional ability and status E.  Nutritional status F.  Physical activity G. Advance directives H. List of other physicians I.  Hospitalizations, surgeries, and ER visits in previous 12 months J.  [place] such as hearing and vision if needed, cognitive and depression L. Referrals and appointments   In addition, I have reviewed and discussed with patient certain preventive protocols, quality metrics, and best practice recommendations. A written personalized care plan for preventive services as well as general preventive health recommendations were provided to patient. Nurse Health Advisor  Signed,    Shellie Rogoff [place], [place]  5/63/1497 Nurse Health Advisor   Nurse Notes: Declined tetanus today.

## 2018-09-30 NOTE — Patient Instructions (Signed)
Carrie Alvarez , Thank you for taking time to come for your Medicare Wellness Visit. I appreciate your ongoing commitment to your health goals. Please review the following plan we discussed and let me know if I can assist you in the future.   Screening recommendations/referrals: Colonoscopy: Up to date, due 04/2019 Mammogram: Up to date, due 06/2020 Bone Density: Up to date, due 01/2028 Recommended yearly ophthalmology/optometry visit for glaucoma screening and checkup Recommended yearly dental visit for hygiene and checkup  Vaccinations: Influenza vaccine: Up to date Pneumococcal vaccine: Completed series Tdap vaccine: Pt declines today.  Shingles vaccine: Pt declines today.     Advanced directives: Advance directive discussed with you today. Even though you declined this today please call our office should you change your mind and we can give you the proper paperwork for you to fill out.  Conditions/risks identified: Fall risk prevention discussed today.   Next appointment: 10/07/18 @ 3:00 PM with Carles Collet.   Preventive Care 22 Years and Older, Female Preventive care refers to lifestyle choices and visits with your health care provider that can promote health and wellness. What does preventive care include?  A yearly physical exam. This is also called an annual well check.  Dental exams once or twice a year.  Routine eye exams. Ask your health care provider how often you should have your eyes checked.  Personal lifestyle choices, including:  Daily care of your teeth and gums.  Regular physical activity.  Eating a healthy diet.  Avoiding tobacco and drug use.  Limiting alcohol use.  Practicing safe sex.  Taking low-dose aspirin every day.  Taking vitamin and mineral supplements as recommended by your health care provider. What happens during an annual well check? The services and screenings done by your health care provider during your annual well check will  depend on your age, overall health, lifestyle risk factors, and family history of disease. Counseling  Your health care provider may ask you questions about your:  Alcohol use.  Tobacco use.  Drug use.  Emotional well-being.  Home and relationship well-being.  Sexual activity.  Eating habits.  History of falls.  Memory and ability to understand (cognition).  Work and work Statistician.  Reproductive health. Screening  You may have the following tests or measurements:  Height, weight, and BMI.  Blood pressure.  Lipid and cholesterol levels. These may be checked every 5 years, or more frequently if you are over 36 years old.  Skin check.  Lung cancer screening. You may have this screening every year starting at age 70 if you have a 30-pack-year history of smoking and currently smoke or have quit within the past 15 years.  Fecal occult blood test (FOBT) of the stool. You may have this test every year starting at age 2.  Flexible sigmoidoscopy or colonoscopy. You may have a sigmoidoscopy every 5 years or a colonoscopy every 10 years starting at age 17.  Hepatitis C blood test.  Hepatitis B blood test.  Sexually transmitted disease (STD) testing.  Diabetes screening. This is done by checking your blood sugar (glucose) after you have not eaten for a while (fasting). You may have this done every 1-3 years.  Bone density scan. This is done to screen for osteoporosis. You may have this done starting at age 84.  Mammogram. This may be done every 1-2 years. Talk to your health care provider about how often you should have regular mammograms. Talk with your health care provider about your test results,  treatment options, and if necessary, the need for more tests. Vaccines  Your health care provider may recommend certain vaccines, such as:  Influenza vaccine. This is recommended every year.  Tetanus, diphtheria, and acellular pertussis (Tdap, Td) vaccine. You may need a  Td booster every 10 years.  Zoster vaccine. You may need this after age 52.  Pneumococcal 13-valent conjugate (PCV13) vaccine. One dose is recommended after age 68.  Pneumococcal polysaccharide (PPSV23) vaccine. One dose is recommended after age 14. Talk to your health care provider about which screenings and vaccines you need and how often you need them. This information is not intended to replace advice given to you by your health care provider. Make sure you discuss any questions you have with your health care provider. Document Released: 07/26/2015 Document Revised: 03/18/2016 Document Reviewed: 04/30/2015 Elsevier Interactive Patient Education  2017 [employer] Prevention in the Home Falls can cause injuries. They can happen to people of all ages. There are many things you can do to make your home safe and to help prevent falls. What can I do on the outside of my home?  Regularly fix the edges of walkways and driveways and fix any cracks.  Remove anything that might make you trip as you walk through a door, such as a raised step or threshold.  Trim any bushes or trees on the path to your home.  Use bright outdoor lighting.  Clear any walking paths of anything that might make someone trip, such as rocks or tools.  Regularly check to see if handrails are loose or broken. Make sure that both sides of any steps have handrails.  Any raised decks and porches should have guardrails on the edges.  Have any leaves, snow, or ice cleared regularly.  Use sand or salt on walking paths during winter.  Clean up any spills in your garage right away. This includes oil or grease spills. What can I do in the bathroom?  Use night lights.  Install grab bars by the toilet and in the tub and shower. Do not use towel bars as grab bars.  Use non-skid mats or decals in the tub or shower.  If you need to sit down in the shower, use a plastic, non-slip stool.  Keep the floor dry. Clean  up any water that spills on the floor as soon as it happens.  Remove soap buildup in the tub or shower regularly.  Attach bath mats securely with double-sided non-slip rug tape.  Do not have throw rugs and other things on the floor that can make you trip. What can I do in the bedroom?  Use night lights.  Make sure that you have a light by your bed that is easy to reach.  Do not use any sheets or blankets that are too big for your bed. They should not hang down onto the floor.  Have a firm chair that has side arms. You can use this for support while you get dressed.  Do not have throw rugs and other things on the floor that can make you trip. What can I do in the kitchen?  Clean up any spills right away.  Avoid walking on wet floors.  Keep items that you use a lot in easy-to-reach places.  If you need to reach something above you, use a strong step stool that has a grab bar.  Keep electrical cords out of the way.  Do not use floor polish or wax that makes floors  slippery. If you must use wax, use non-skid floor wax.  Do not have throw rugs and other things on the floor that can make you trip. What can I do with my stairs?  Do not leave any items on the stairs.  Make sure that there are handrails on both sides of the stairs and use them. Fix handrails that are broken or loose. Make sure that handrails are as long as the stairways.  Check any carpeting to make sure that it is firmly attached to the stairs. Fix any carpet that is loose or worn.  Avoid having throw rugs at the top or bottom of the stairs. If you do have throw rugs, attach them to the floor with carpet tape.  Make sure that you have a light switch at the top of the stairs and the bottom of the stairs. If you do not have them, ask someone to add them for you. What else can I do to help prevent falls?  Wear shoes that:  Do not have high heels.  Have rubber bottoms.  Are comfortable and fit you well.  Are  closed at the toe. Do not wear sandals.  If you use a stepladder:  Make sure that it is fully opened. Do not climb a closed stepladder.  Make sure that both sides of the stepladder are locked into place.  Ask someone to hold it for you, if possible.  Clearly mark and make sure that you can see:  Any grab bars or handrails.  First and last steps.  Where the edge of each step is.  Use tools that help you move around (mobility aids) if they are needed. These include:  Canes.  Walkers.  Scooters.  Crutches.  Turn on the lights when you go into a dark area. Replace any light bulbs as soon as they burn out.  Set up your furniture so you have a clear path. Avoid moving your furniture around.  If any of your floors are uneven, fix them.  If there are any pets around you, be aware of where they are.  Review your medicines with your doctor. Some medicines can make you feel dizzy. This can increase your chance of falling. Ask your doctor what other things that you can do to help prevent falls. This information is not intended to replace advice given to you by your health care provider. Make sure you discuss any questions you have with your health care provider. Document Released: 04/25/2009 Document Revised: 12/05/2015 Document Reviewed: 08/03/2014 Elsevier Interactive Patient Education  2017 [employer].

## 2018-10-03 ENCOUNTER — Encounter: Payer: Medicare HMO | Admitting: Physician Assistant

## 2018-11-17 ENCOUNTER — Encounter: Payer: Self-pay | Admitting: Physician Assistant

## 2018-11-17 ENCOUNTER — Ambulatory Visit (INDEPENDENT_AMBULATORY_CARE_PROVIDER_SITE_OTHER): Payer: Medicare Other | Admitting: Physician Assistant

## 2018-11-17 DIAGNOSIS — J309 Allergic rhinitis, unspecified: Secondary | ICD-10-CM | POA: Diagnosis not present

## 2018-11-17 DIAGNOSIS — F329 Major depressive disorder, single episode, unspecified: Secondary | ICD-10-CM

## 2018-11-17 DIAGNOSIS — E7800 Pure hypercholesterolemia, unspecified: Secondary | ICD-10-CM | POA: Diagnosis not present

## 2018-11-17 DIAGNOSIS — Z1211 Encounter for screening for malignant neoplasm of colon: Secondary | ICD-10-CM | POA: Diagnosis not present

## 2018-11-17 DIAGNOSIS — I10 Essential (primary) hypertension: Secondary | ICD-10-CM | POA: Diagnosis not present

## 2018-11-17 DIAGNOSIS — Z0000 Encounter for general adult medical examination without abnormal findings: Secondary | ICD-10-CM | POA: Diagnosis not present

## 2018-11-17 DIAGNOSIS — F32A Depression, unspecified: Secondary | ICD-10-CM

## 2018-11-17 MED ORDER — FLUTICASONE PROPIONATE 50 MCG/ACT NA SUSP: 2.0000 | NASAL | 0 refills | Status: DC | PRN

## 2018-11-17 MED ORDER — SERTRALINE HCL 100 MG PO TABS: 200.0000 mg | ORAL_TABLET | Freq: Every day | ORAL | 0 refills | Status: DC

## 2018-11-17 MED ORDER — AMLODIPINE BESYLATE 10 MG PO TABS: 10.0000 mg | ORAL_TABLET | Freq: Every day | ORAL | 0 refills | Status: DC

## 2018-11-17 MED ORDER — ATORVASTATIN CALCIUM 10 MG PO TABS: 10.0000 mg | ORAL_TABLET | Freq: Every day | ORAL | 0 refills | Status: DC

## 2018-11-17 NOTE — Progress Notes (Signed)
Patient: Carrie Alvarez, Female    DOB: 11-05-1950, 68 y.o.   MRN: [id number] Visit Date: 12/01/2018  Today's Provider: Trinna Post, PA-C   Chief Complaint  Patient presents with  . Medicare Wellness   Subjective:    Annual physical exam Carrie Alvarez is a 68 y.o. female who presents today for health maintenance and complete physical. She feels fairly well. She reports occasional exercising, but states that she has back and knee pain . She reports she is sleeping well.  PAP: not indicated.  Colonoscopy: 04/24/2011 performed by Dr. Bary Castilla showed a single non-bleeding colonic angioectasia that was treated with thermal therapy. Recommended 8 year follow up.  Mammogram: 07/09/2018 normal and repeat 1 year.  Prevnar: 09/22/2016 Pneumovax: 09/28/2017  HTN: Currently taking amlodipine 10 mg daily without issues.   BP Readings from Last 3 Encounters:  09/30/18 108/64  08/09/18 125/88  07/21/18 125/90    HLD: Taking Lipitor 10 mg daily without issue.   Lipid Panel     Component Value Date/Time   CHOL 170 10/27/2017 0927   TRIG 88 10/27/2017 0927   HDL 68 10/27/2017 0927   CHOLHDL 2.5 10/27/2017 0927   LDLCALC 84 10/27/2017 0927   Depression: Continues 200 mg zoloft daily without issue.  Chronic Pain Management: She continues to see Dr. Primus Bravo in [place], [place].   Reports she still feels like she has sinus congestion and possibly infection. Has facial pain and pressure. She is taking store brand of claritin.   -----------------------------------------------------------------   Review of Systems  Social History She  reports that she quit smoking about 4 years ago. Her smoking use included cigarettes. She smoked 0.50 packs per day. She has never used smokeless tobacco. She reports that she does not drink alcohol or use drugs. Social History   Socioeconomic History  . Marital status: Married    Spouse name: Not on file  . Number of children: 3   . Years of education: Not on file  . Highest education level: Some college, no degree  Occupational History  . Occupation: retired    Comment: part time work @ a [employer]  . Financial resource strain: Somewhat hard  . Food insecurity:    Worry: Often true    Inability: Often true  . Transportation needs:    Medical: No    Non-medical: No  Tobacco Use  . Smoking status: Former Smoker    Packs/day: 0.50    Types: Cigarettes    Last attempt to quit: 07/18/2014    Years since quitting: 4.3  . Smokeless tobacco: Never Used  Substance and Sexual Activity  . Alcohol use: No    Alcohol/week: 0.0 standard drinks  . Drug use: No  . Sexual activity: Not on file  Lifestyle  . Physical activity:    Days per week: 0 days    Minutes per session: 0 min  . Stress: To some extent  Relationships  . Social connections:    Talks on phone: Patient refused    Gets together: Patient refused    Attends religious service: Patient refused    Active member of club or organization: Patient refused    Attends meetings of clubs or organizations: Patient refused    Relationship status: Patient refused  Other Topics Concern  . Not on file  Social History Narrative  . Not on file    Patient Active Problem List   Diagnosis Date Noted  . Morbid  obesity ([place]) 06/07/2018  . Acute cholecystitis   . Transaminitis   . Cholangitis 04/18/2016  . Allergic rhinitis 09/04/2015  . AA (alopecia areata) 09/04/2015  . Arthritis, degenerative 09/04/2015  . Cancer of uterus ([place]) 09/04/2015  . Avitaminosis D 09/04/2015  . Benign positional vertigo 09/04/2015  . Hypertension 03/15/2015  . DDD (degenerative disc disease), lumbar 03/07/2015  . Dizziness 12/25/2014  . Lumbosacral facet joint syndrome 12/10/2014  . Sacroiliac joint dysfunction 12/10/2014  . DDD (degenerative disc disease), lumbosacral 11/13/2014  . Clinical depression 10/13/2014  . Breast cancer metastasized to axillary lymph node  ([place]) 06/21/2014  . Hypercholesteremia 08/30/2009  . Compulsive tobacco user syndrome 04/21/2008  . Adaptation reaction 07/18/2007    Past Surgical History:  Procedure Laterality Date  . ABDOMINAL HYSTERECTOMY  1994   UTERINE CA  . AXILLARY LYMPH NODE BIOPSY Left 12/18/2014   Procedure: AXILLARY LYMPH NODE BIOPSY/;  Surgeon: Robert Bellow, MD;  Location: [facility] ORS;  Service: General;  Laterality: Left;  . AXILLARY LYMPH NODE DISSECTION Left 12/18/2014   Procedure: AXILLARY LYMPH NODE DISSECTION;  Surgeon: Robert Bellow, MD;  Location: [facility] ORS;  Service: General;  Laterality: Left;  . BREAST BIOPSY Left 05/2014   CORE BX OF LN, METASTATIC ADENOCARCINOMA  . BREAST LUMPECTOMY Left 12/2014   CHEMO FIRST THEN SURGERY OF LN  . BREAST SURGERY    . CHOLECYSTECTOMY N/A 04/19/2016   Procedure: LAPAROSCOPIC CHOLECYSTECTOMY WITH INTRAOPERATIVE CHOLANGIOGRAM;  Surgeon: Hubbard Robinson, MD;  Location: [facility] ORS;  Service: General;  Laterality: N/A;  . medial branch block  11/06/2015   lumbar facet Dr. Primus Bravo  . SENTINEL NODE BIOPSY Left 12/18/2014   Procedure: SENTINEL NODE BIOPSY;  Surgeon: Robert Bellow, MD;  Location: [facility] ORS;  Service: General;  Laterality: Left;    Family History  Family Status  Relation Name Status  . Sister  Alive  . Mother  Deceased       fell- had spine sx, dependent. Pt is caregiver.  . Father  Deceased   Her family history includes Asthma in her mother; Breast cancer (age of onset: 2) in her sister; Cancer in her father; Colon cancer in her mother and sister; Hypertension in her mother.     No Known Allergies  Previous Medications   ACETAMINOPHEN (TYLENOL) 500 MG TABLET    Take 500 mg by mouth every 6 (six) hours as needed for mild pain or moderate pain.    ALBUTEROL (PROVENTIL HFA;VENTOLIN HFA) 108 (90 BASE) MCG/ACT INHALER    Inhale 2 puffs into the lungs every 6 (six) hours as needed for wheezing or shortness of breath.   BACLOFEN (LIORESAL) 10 MG  TABLET    Take 1 tablet (10 mg total) by mouth 2 (two) times daily.   CHOLECALCIFEROL (VITAMIN D) 1000 UNITS TABLET    Take 1,000 Units by mouth daily.   GABAPENTIN (NEURONTIN) 300 MG CAPSULE    Take 300 mg by mouth daily as needed (neuropathic pain).    LETROZOLE (FEMARA) 2.5 MG TABLET    Take 1 tablet (2.5 mg total) by mouth daily.   OXYCODONE (OXY IR/ROXICODONE) 5 MG IMMEDIATE RELEASE TABLET    Take 5-10 mg by mouth as directed. Takes half to 1 tablet 3-5xs daily as tolerated.   OXYCODONE HCL 10 MG TABS    LIMIT ONE HALF TO ONE TABLET BY MOUTH 3 TO 5 TIMES DAILY IF TOLERATED. TABLET IS 10MG .   PYRIDOXINE (B-6) 100 MG TABLET    Take  100 mg by mouth daily.   VITAMIN B-12 (CYANOCOBALAMIN) 500 MCG TABLET    Take 500 mcg by mouth daily.    Patient Care Team: Paulene Floor as PCP - General (Physician Assistant) [name], MD as Consulting Physician (Oncology) [name], MD as Attending Physician (Pain Medicine) [name]., MD as Consulting Physician (Ophthalmology)      Objective:   Vitals: There were no vitals taken for this visit.   Physical Exam   Depression Screen PHQ 2/9 Scores 09/30/2018 09/30/2018 09/28/2017 10/27/2016  PHQ - 2 Score 0 0 1 0  PHQ- 9 Score 2 - - -  Exception Documentation - - - -      Assessment & Plan:     Routine Health Maintenance and Physical Exam  Exercise Activities and Dietary recommendations Goals    . DIET - INCREASE WATER INTAKE     Recommend increasing water intake to 6-8 glasses a day.    . Prevent falls     Recommend to remove any items from the home that may cause slips or trips.       Immunization History  Administered Date(s) Administered  . Influenza, High Dose Seasonal PF 06/07/2017, 06/07/2018  . Influenza,inj,Quad PF,6+ Mos 04/20/2016  . Pneumococcal Conjugate-13 09/22/2016  . Pneumococcal Polysaccharide-23 09/28/2017    Health Maintenance  Topic Date Due  . TETANUS/TDAP  07/13/2026  (Originally 09/21/1969)  . INFLUENZA VACCINE  02/11/2019  . MAMMOGRAM  06/29/2020  . COLONOSCOPY  04/23/2021  . DEXA SCAN  Completed  . Hepatitis C Screening  Completed  . PNA vac Low Risk Adult  Completed     Discussed health benefits of physical activity, and encouraged her to engage in regular exercise appropriate for her age and condition.    1. Annual physical exam   2. Colon cancer screening  - Ambulatory referral to Gastroenterology  3. Depression, unspecified depression type  - sertraline (ZOLOFT) 100 MG tablet; Take 2 tablets (200 mg total) by mouth daily.  Dispense: 180 tablet; Refill: 0  4. Essential hypertension  - amLODipine (NORVASC) 10 MG tablet; Take 1 tablet (10 mg total) by mouth daily.  Dispense: 90 tablet; Refill: 0  5. Allergic rhinitis, unspecified seasonality, unspecified trigger  - fluticasone (FLONASE) 50 MCG/ACT nasal spray; Place 2 sprays into both nostrils as needed for allergies or rhinitis.  Dispense: 16 g; Refill: 0  6. Hypercholesteremia  - atorvastatin (LIPITOR) 10 MG tablet; Take 1 tablet (10 mg total) by mouth daily.  Dispense: 90 tablet; Refill: 0  The entirety of the information documented in the History of Present Illness, Review of Systems and Physical Exam were personally obtained by me. Portions of this information were initially documented by Jennings Books, CMA and reviewed by me for thoroughness and accuracy.   F/u 6 months HTN, HLD   --------------------------------------------------------------------

## 2018-12-12 ENCOUNTER — Encounter: Payer: Self-pay | Admitting: *Deleted

## 2018-12-16 ENCOUNTER — Other Ambulatory Visit: Payer: Self-pay | Admitting: Physician Assistant

## 2018-12-16 DIAGNOSIS — J309 Allergic rhinitis, unspecified: Secondary | ICD-10-CM

## 2018-12-19 ENCOUNTER — Telehealth: Payer: Self-pay | Admitting: Gastroenterology

## 2018-12-19 ENCOUNTER — Telehealth: Payer: Self-pay

## 2018-12-19 ENCOUNTER — Other Ambulatory Visit: Payer: Self-pay

## 2018-12-19 DIAGNOSIS — Z800 Family history of malignant neoplasm of digestive organs: Secondary | ICD-10-CM

## 2018-12-19 DIAGNOSIS — Z1211 Encounter for screening for malignant neoplasm of colon: Secondary | ICD-10-CM

## 2018-12-19 NOTE — Telephone Encounter (Signed)
Pt is calling to schedule a colonoscopy  °

## 2018-12-19 NOTE — Telephone Encounter (Signed)
Contacted patient to reschedule June 19 colonoscopy to June 18th colonoscopy due to scheduling changes.  She has agreed to move to Thursday June 18th instead of Friday June 19th with Dr. Bonna Gains.  Thanks [employer]

## 2018-12-19 NOTE — Telephone Encounter (Signed)
Gastroenterology Pre-Procedure Review  Request Date: 12/30/18 Requesting Physician: Dr. Bonna Gains  PATIENT REVIEW QUESTIONS: The patient responded to the following health history questions as indicated:    1. Are you having any GI issues? No 2. Do you have a personal history of Polyps? History of Colon polyps 2012 3. Do you have a family history of Colon Cancer or Polyps?mom colon cancer 4. Diabetes Mellitus?no 5. Joint replacements in the past 12 months?No 6. Major health problems in the past 3 months?No 7. Any artificial heart valves, MVP, or defibrillator?No MEDICATIONS & ALLERGIES:    Patient reports the following regarding taking any anticoagulation/antiplatelet therapy:   Plavix, Coumadin, Eliquis, Xarelto, Lovenox, Pradaxa, Brilinta, or Effient? No Aspirin? No  Patient confirms/reports the following medications:  Current Outpatient Medications  Medication Sig Dispense Refill  . acetaminophen (TYLENOL) 500 MG tablet Take 500 mg by mouth every 6 (six) hours as needed for mild pain or moderate pain.     Marland Kitchen albuterol (PROVENTIL HFA;VENTOLIN HFA) 108 (90 Base) MCG/ACT inhaler Inhale 2 puffs into the lungs every 6 (six) hours as needed for wheezing or shortness of breath. 1 Inhaler 2  . amLODipine (NORVASC) 10 MG tablet Take 1 tablet (10 mg total) by mouth daily. 90 tablet 0  . atorvastatin (LIPITOR) 10 MG tablet Take 1 tablet (10 mg total) by mouth daily. 90 tablet 0  . baclofen (LIORESAL) 10 MG tablet Take 1 tablet (10 mg total) by mouth 2 (two) times daily. (Patient taking differently: Take 10 mg by mouth 2 (two) times daily. Taking 3 times a day) 30 each 0  . cholecalciferol (VITAMIN D) 1000 units tablet Take 1,000 Units by mouth daily.    . fluticasone (FLONASE) 50 MCG/ACT nasal spray USE 2 SPRAY(S) IN EACH NOSTRIL AS NEEDED FOR ALLERGIES OR RHINITIS. 16 g 0  . gabapentin (NEURONTIN) 300 MG capsule Take 300 mg by mouth daily as needed (neuropathic pain).     Marland Kitchen letrozole (FEMARA)  2.5 MG tablet Take 1 tablet (2.5 mg total) by mouth daily. 90 tablet 3  . oxyCODONE (OXY IR/ROXICODONE) 5 MG immediate release tablet Take 5-10 mg by mouth as directed. Takes half to 1 tablet 3-5xs daily as tolerated.    . Oxycodone HCl 10 MG TABS LIMIT ONE HALF TO ONE TABLET BY MOUTH 3 TO 5 TIMES DAILY IF TOLERATED. TABLET IS 10MG .    . pyridoxine (B-6) 100 MG tablet Take 100 mg by mouth daily.    . sertraline (ZOLOFT) 100 MG tablet Take 2 tablets (200 mg total) by mouth daily. 180 tablet 0  . vitamin B-12 (CYANOCOBALAMIN) 500 MCG tablet Take 500 mcg by mouth daily.     No current facility-administered medications for this visit.    Facility-Administered Medications Ordered in Other Visits  Medication Dose Route Frequency Provider Last Rate Last Dose  . heparin lock flush 100 unit/mL  500 Units Intravenous Once [name], MD        Patient confirms/reports the following allergies:  No Known Allergies  No orders of the defined types were placed in this encounter.   AUTHORIZATION INFORMATION Primary Insurance: 1D#: Group #:  Secondary Insurance: 1D#: Group #:  SCHEDULE INFORMATION: Date: 12/30/18 Time: Location:[facility]

## 2018-12-26 ENCOUNTER — Encounter
Admission: RE | Admit: 2018-12-26 | Discharge: 2018-12-26 | Disposition: A | Discharge status: Home / Self Care | Payer: Medicare Other | Source: Ambulatory Visit | Attending: Gastroenterology | Admitting: Gastroenterology

## 2018-12-27 ENCOUNTER — Other Ambulatory Visit: Payer: Self-pay

## 2018-12-27 ENCOUNTER — Telehealth: Payer: Self-pay

## 2018-12-27 ENCOUNTER — Other Ambulatory Visit
Admission: RE | Admit: 2018-12-27 | Discharge: 2018-12-27 | Disposition: A | Discharge status: Home / Self Care | Payer: Medicare Other | Source: Ambulatory Visit | Attending: Gastroenterology | Admitting: Gastroenterology

## 2018-12-27 ENCOUNTER — Encounter: Payer: Self-pay | Admitting: *Deleted

## 2018-12-27 DIAGNOSIS — Z1211 Encounter for screening for malignant neoplasm of colon: Secondary | ICD-10-CM

## 2018-12-27 DIAGNOSIS — Z1159 Encounter for screening for other viral diseases: Secondary | ICD-10-CM | POA: Diagnosis present

## 2018-12-27 NOTE — Anesthesia Preprocedure Evaluation (Addendum)
Anesthesia Evaluation  Patient identified by MRN, date of birth, ID band Patient awake    Reviewed: Allergy & Precautions, NPO status , Patient's Chart, lab work & pertinent test results  History of Anesthesia Complications Negative for: history of anesthetic complications  Airway Mallampati: III   Neck ROM: Full    Dental  (+)    Pulmonary former smoker (quit 2016),    Pulmonary exam normal breath sounds clear to auscultation       Cardiovascular hypertension, Normal cardiovascular exam Rhythm:Regular Rate:Normal     Neuro/Psych PSYCHIATRIC DISORDERS Depression negative neurological ROS     GI/Hepatic negative GI ROS,   Endo/Other  negative endocrine ROS  Renal/GU negative Renal ROS     Musculoskeletal  (+) Arthritis ,   Abdominal   Peds  Hematology  (+) Blood dyscrasia, anemia , Uterine and breast cancers   Anesthesia Other Findings   Reproductive/Obstetrics                            Anesthesia Physical Anesthesia Plan  ASA: II  Anesthesia Plan: General   Post-op Pain Management:    Induction: Intravenous  PONV Risk Score and Plan: 3 and Propofol infusion and TIVA  Airway Management Planned: Natural Airway  Additional Equipment:   Intra-op Plan:   Post-operative Plan:   Informed Consent: I have reviewed the patients History and Physical, chart, labs and discussed the procedure including the risks, benefits and alternatives for the proposed anesthesia with the patient or authorized representative who has indicated his/her understanding and acceptance.       Plan Discussed with: CRNA  Anesthesia Plan Comments:        Anesthesia Quick Evaluation

## 2018-12-27 NOTE — Telephone Encounter (Signed)
Patient was contacted in regards to rescheduling her Colonoscopy due to COVID testing not being completed.  She has been rescheduled to June 19th at [facility] with Dr. Allen Norris.  She plans on having her COVID test on today.  Thanks [employer]

## 2018-12-28 LAB — NOVEL CORONAVIRUS, NAA (HOSP ORDER, SEND-OUT TO REF LAB; TAT 18-24 HRS): SARS-CoV-2, NAA: NOT DETECTED

## 2018-12-29 ENCOUNTER — Ambulatory Visit: Admission: RE | Admit: 2018-12-29 | Payer: Medicare Other | Source: Home / Self Care | Admitting: Gastroenterology

## 2018-12-29 ENCOUNTER — Encounter: Admission: RE | Payer: Self-pay | Source: Home / Self Care

## 2018-12-29 SURGERY — COLONOSCOPY WITH PROPOFOL
Anesthesia: General

## 2018-12-29 NOTE — Discharge Instructions (Signed)
General Anesthesia, Adult, Care After  This sheet gives you information about how to care for yourself after your procedure. Your health care provider may also give you more specific instructions. If you have problems or questions, contact your health care provider.  What can I expect after the procedure?  After the procedure, the following side effects are common:  Pain or discomfort at the IV site.  Nausea.  Vomiting.  Sore throat.  Trouble concentrating.  Feeling cold or chills.  Weak or tired.  Sleepiness and fatigue.  Soreness and body aches. These side effects can affect parts of the body that were not involved in surgery.  Follow these instructions at home:    For at least 24 hours after the procedure:  Have a responsible adult stay with you. It is important to have someone help care for you until you are awake and alert.  Rest as needed.  Do not:  Participate in activities in which you could fall or become injured.  Drive.  Use heavy machinery.  Drink alcohol.  Take sleeping pills or medicines that cause drowsiness.  Make important decisions or sign legal documents.  Take care of children on your own.  Eating and drinking  Follow any instructions from your health care provider about eating or drinking restrictions.  When you feel hungry, start by eating small amounts of foods that are soft and easy to digest (bland), such as toast. Gradually return to your regular diet.  Drink enough fluid to keep your urine pale yellow.  If you vomit, rehydrate by drinking water, juice, or clear broth.  General instructions  If you have sleep apnea, surgery and certain medicines can increase your risk for breathing problems. Follow instructions from your health care provider about wearing your sleep device:  Anytime you are sleeping, including during daytime naps.  While taking prescription pain medicines, sleeping medicines, or medicines that make you drowsy.  Return to your normal activities as told by your health care  provider. Ask your health care provider what activities are safe for you.  Take over-the-counter and prescription medicines only as told by your health care provider.  If you smoke, do not smoke without supervision.  Keep all follow-up visits as told by your health care provider. This is important.  Contact a health care provider if:  You have nausea or vomiting that does not get better with medicine.  You cannot eat or drink without vomiting.  You have pain that does not get better with medicine.  You are unable to pass urine.  You develop a skin rash.  You have a fever.  You have redness around your IV site that gets worse.  Get help right away if:  You have difficulty breathing.  You have chest pain.  You have blood in your urine or stool, or you vomit blood.  Summary  After the procedure, it is common to have a sore throat or nausea. It is also common to feel tired.  Have a responsible adult stay with you for the first 24 hours after general anesthesia. It is important to have someone help care for you until you are awake and alert.  When you feel hungry, start by eating small amounts of foods that are soft and easy to digest (bland), such as toast. Gradually return to your regular diet.  Drink enough fluid to keep your urine pale yellow.  Return to your normal activities as told by your health care provider. Ask your health care   provider what activities are safe for you.  This information is not intended to replace advice given to you by your health care provider. Make sure you discuss any questions you have with your health care provider.  Document Released: 10/05/2000 Document Revised: 02/12/2017 Document Reviewed: 02/12/2017  Elsevier Interactive Patient Education  2019 [employer]

## 2018-12-30 ENCOUNTER — Encounter
Admission: RE | Disposition: A | Discharge status: Home / Self Care | Payer: Self-pay | Source: Home / Self Care | Attending: Gastroenterology

## 2018-12-30 ENCOUNTER — Ambulatory Visit
Admission: RE | Admit: 2018-12-30 | Discharge: 2018-12-30 | Disposition: A | Discharge status: Home / Self Care | Payer: Medicare Other | Attending: Gastroenterology | Admitting: Gastroenterology

## 2018-12-30 ENCOUNTER — Ambulatory Visit: Payer: Medicare Other | Admitting: Anesthesiology

## 2018-12-30 ENCOUNTER — Other Ambulatory Visit: Payer: Self-pay

## 2018-12-30 DIAGNOSIS — Z79811 Long term (current) use of aromatase inhibitors: Secondary | ICD-10-CM | POA: Insufficient documentation

## 2018-12-30 DIAGNOSIS — C185 Malignant neoplasm of splenic flexure: Secondary | ICD-10-CM | POA: Diagnosis not present

## 2018-12-30 DIAGNOSIS — Z6835 Body mass index (BMI) 35.0-35.9, adult: Secondary | ICD-10-CM | POA: Diagnosis not present

## 2018-12-30 DIAGNOSIS — C50912 Malignant neoplasm of unspecified site of left female breast: Secondary | ICD-10-CM | POA: Diagnosis not present

## 2018-12-30 DIAGNOSIS — Z860100 Personal history of colon polyps, unspecified: Secondary | ICD-10-CM

## 2018-12-30 DIAGNOSIS — Z87891 Personal history of nicotine dependence: Secondary | ICD-10-CM | POA: Insufficient documentation

## 2018-12-30 DIAGNOSIS — Z9221 Personal history of antineoplastic chemotherapy: Secondary | ICD-10-CM | POA: Insufficient documentation

## 2018-12-30 DIAGNOSIS — E669 Obesity, unspecified: Secondary | ICD-10-CM | POA: Diagnosis not present

## 2018-12-30 DIAGNOSIS — I10 Essential (primary) hypertension: Secondary | ICD-10-CM | POA: Diagnosis not present

## 2018-12-30 DIAGNOSIS — Z800 Family history of malignant neoplasm of digestive organs: Secondary | ICD-10-CM | POA: Diagnosis not present

## 2018-12-30 DIAGNOSIS — Z8542 Personal history of malignant neoplasm of other parts of uterus: Secondary | ICD-10-CM | POA: Diagnosis not present

## 2018-12-30 DIAGNOSIS — Z1211 Encounter for screening for malignant neoplasm of colon: Secondary | ICD-10-CM

## 2018-12-30 DIAGNOSIS — D490 Neoplasm of unspecified behavior of digestive system: Secondary | ICD-10-CM

## 2018-12-30 DIAGNOSIS — Z79899 Other long term (current) drug therapy: Secondary | ICD-10-CM | POA: Diagnosis not present

## 2018-12-30 DIAGNOSIS — D123 Benign neoplasm of transverse colon: Secondary | ICD-10-CM | POA: Insufficient documentation

## 2018-12-30 DIAGNOSIS — F329 Major depressive disorder, single episode, unspecified: Secondary | ICD-10-CM | POA: Insufficient documentation

## 2018-12-30 DIAGNOSIS — K635 Polyp of colon: Secondary | ICD-10-CM

## 2018-12-30 HISTORY — PX: POLYPECTOMY: SHX5525

## 2018-12-30 HISTORY — PX: COLONOSCOPY WITH PROPOFOL: SHX5780

## 2018-12-30 SURGERY — COLONOSCOPY WITH PROPOFOL
Anesthesia: General | Site: Rectum

## 2018-12-30 MED ORDER — SODIUM CHLORIDE 0.9 % IV SOLN: INTRAVENOUS | Status: DC

## 2018-12-30 MED ORDER — STERILE WATER FOR IRRIGATION IR SOLN
Status: DC | PRN
  Administered 2018-12-30: 15 mL

## 2018-12-30 MED ORDER — ACETAMINOPHEN 160 MG/5ML PO SOLN: 325.0000 mg | ORAL | Status: DC | PRN

## 2018-12-30 MED ORDER — LACTATED RINGERS IV SOLN
INTRAVENOUS | Status: DC
  Administered 2018-12-30 (×2): via INTRAVENOUS

## 2018-12-30 MED ORDER — LIDOCAINE HCL (CARDIAC) PF 100 MG/5ML IV SOSY
PREFILLED_SYRINGE | INTRAVENOUS | Status: DC | PRN
  Administered 2018-12-30: 30 mg via INTRAVENOUS

## 2018-12-30 MED ORDER — PROPOFOL 10 MG/ML IV BOLUS
INTRAVENOUS | Status: DC | PRN
  Administered 2018-12-30: 20 mg via INTRAVENOUS
  Administered 2018-12-30 (×8): 30 mg via INTRAVENOUS
  Administered 2018-12-30: 100 mg via INTRAVENOUS
  Administered 2018-12-30: 20 mg via INTRAVENOUS
  Administered 2018-12-30: 30 mg via INTRAVENOUS
  Administered 2018-12-30: 20 mg via INTRAVENOUS
  Administered 2018-12-30 (×6): 30 mg via INTRAVENOUS
  Administered 2018-12-30: 20 mg via INTRAVENOUS

## 2018-12-30 MED ORDER — ONDANSETRON HCL 4 MG/2ML IJ SOLN: 4.0000 mg | Freq: Once | INTRAMUSCULAR | Status: DC | PRN

## 2018-12-30 MED ORDER — ACETAMINOPHEN 325 MG PO TABS: 650.0000 mg | ORAL_TABLET | Freq: Once | ORAL | Status: DC | PRN

## 2018-12-30 SURGICAL SUPPLY — 19 items
CANISTER SUCT 1200ML W/VALVE (MISCELLANEOUS) ×2 IMPLANT
CLIP HMST 235XBRD CATH ROT (MISCELLANEOUS) ×6 IMPLANT
CLIP RESOLUTION 360 11X235 (MISCELLANEOUS) ×6
ELECT REM PT RETURN 9FT ADLT (ELECTROSURGICAL) ×2
ELECTRODE REM PT RTRN 9FT ADLT (ELECTROSURGICAL) ×1 IMPLANT
ELEVIEW  INJECTABLE COMP 10 (MISCELLANEOUS) ×1
FORCEPS BIOP RAD 4 LRG CAP 4 (CUTTING FORCEPS) ×2 IMPLANT
GOWN CVR UNV OPN BCK APRN NK (MISCELLANEOUS) ×2 IMPLANT
GOWN ISOL THUMB LOOP REG UNIV (MISCELLANEOUS) ×2
INJECTABLE ELEVIEW COMP 10 (MISCELLANEOUS) ×1 IMPLANT
INJECTOR VARIJECT VIN23 (MISCELLANEOUS) ×1 IMPLANT
KIT ENDO PROCEDURE OLY (KITS) ×2 IMPLANT
MARKER SPOT ENDO TATTOO 5ML (MISCELLANEOUS) ×2 IMPLANT
SNARE LASSO HEX 3 IN 1 (INSTRUMENTS) ×2 IMPLANT
SNARE SHORT THROW 13M SML OVAL (MISCELLANEOUS) ×2 IMPLANT
SPOT EX ENDOSCOPIC TATTOO (MISCELLANEOUS) ×2
TRAP ETRAP POLY (MISCELLANEOUS) ×2 IMPLANT
VARIJECT INJECTOR VIN23 (MISCELLANEOUS) ×2
WATER STERILE IRR 250ML POUR (IV SOLUTION) ×2 IMPLANT

## 2018-12-30 NOTE — Op Note (Signed)
[facility] Gastroenterology Patient Name: Carrie Alvarez Procedure Date: 12/30/2018 10:32 AM MRN: [id number] Account #: [id number] Date of Birth: 12/21/1950 Admit Type: Outpatient Age: 68 Room: [facility] OR ROOM 01 Gender: Female Note Status: Finalized Procedure:            Colonoscopy Indications:          High risk colon cancer surveillance: Personal history                        of colonic polyps Providers:            Lucilla Lame MD, MD Referring MD:         Wendee Beavers. Terrilee Croak (Referring MD) Medicines:            Propofol per Anesthesia Complications:        No immediate complications. Procedure:            Pre-Anesthesia Assessment:                       - Prior to the procedure, a History and Physical was                        performed, and patient medications and allergies were                        reviewed. The patient's tolerance of previous                        anesthesia was also reviewed. The risks and benefits of                        the procedure and the sedation options and risks were                        discussed with the patient. All questions were                        answered, and informed consent was obtained. Prior                        Anticoagulants: The patient has taken no previous                        anticoagulant or antiplatelet agents. ASA Grade                        Assessment: II - A patient with mild systemic disease.                        After reviewing the risks and benefits, the patient was                        deemed in satisfactory condition to undergo the                        procedure.                       After obtaining informed consent, the colonoscope was  passed under direct vision. Throughout the procedure,                        the patient's blood pressure, pulse, and oxygen                        saturations were monitored continuously. The was   introduced through the anus and advanced to the the                        cecum, identified by appendiceal orifice and ileocecal                        valve. The colonoscopy was performed without                        difficulty. The patient tolerated the procedure well.                        The quality of the bowel preparation was excellent. Findings:      The perianal and digital rectal examinations were normal.      A 15 mm polyp was found in the hepatic flexure. The polyp was sessile.       The polyp was removed with a hot snare. Resection and retrieval were       complete. To prevent bleeding post-intervention, two hemostatic clips       were successfully placed (MR conditional). There was no bleeding at the       end of the procedure.      A 15 mm polyp was found in the transverse colon. The polyp was sessile.       Area was successfully injected with 3 mL saline with indigo carmine for       a lift polypectomy. The polyp was removed with a hot snare. Resection       and retrieval were complete. To prevent bleeding post-intervention, four       hemostatic clips were successfully placed (MR conditional). There was no       bleeding at the end of the procedure.      A non-obstructing large mass was found in the distal transverse colon.       The mass was non-circumferential. No bleeding was present. This was       biopsied with a cold forceps for histology. Area was tattooed with an       injection of 3 mL of [place] ink. Impression:           - One 15 mm polyp at the hepatic flexure, removed with                        a hot snare. Resected and retrieved. Clips (MR                        conditional) were placed.                       - One 15 mm polyp in the transverse colon, removed with                        a hot snare. Resected and retrieved. Injected. Clips                        (  MR conditional) were placed.                       - Likely malignant tumor in the distal  transverse                        colon. Biopsied. Tattooed. Recommendation:       - Discharge patient to home.                       - Resume previous diet.                       - Continue present medications.                       - Await pathology results.                       - Refer to a Psychologist, sport and exercise. Procedure Code(s):    --- Professional ---                       [phone], Colonoscopy, flexible; with removal of tumor(s),                        polyp(s), or other lesion(s) by snare technique                       [id number], Colonoscopy, flexible; with directed submucosal                        injection(s), any substance                       96295, 59, Colonoscopy, flexible; with biopsy, single                        or multiple Diagnosis Code(s):    --- Professional ---                       Z86.010, Personal history of colonic polyps                       K63.5, Polyp of colon                       D49.0, Neoplasm of unspecified behavior of digestive                        system CPT copyright 2019 American Medical Association. All rights reserved. The codes documented in this report are preliminary and upon coder review may  be revised to meet current compliance requirements. Lucilla Lame MD, MD 12/30/2018 11:33:35 AM This report has been signed electronically. Number of Addenda: 0 Note Initiated On: 12/30/2018 10:32 AM Scope Withdrawal Time: 0 hours 44 minutes 35 seconds  Total Procedure Duration: 0 hours 50 minutes 6 seconds  Estimated Blood Loss: Estimated blood loss: none.      [facility]

## 2018-12-30 NOTE — Anesthesia Procedure Notes (Signed)
Date/Time: 12/30/2018 10:33 AM Performed by: Cameron Ali, CRNA Pre-anesthesia Checklist: Patient identified, Emergency Drugs available, Suction available, Timeout performed and Patient being monitored Patient Re-evaluated:Patient Re-evaluated prior to induction Oxygen Delivery Method: Nasal cannula Placement Confirmation: positive ETCO2

## 2018-12-30 NOTE — H&P (Signed)
Carrie Lame, MD [address] [place],  [address] Phone:[phone] Fax : [phone]  Primary Care Physician:  Trinna Post, PA-C Primary Gastroenterologist:  Dr. Allen Norris  Pre-Procedure History & Physical: HPI:  Carrie Alvarez is a 68 y.o. female is here for an colonoscopy.   Past Medical History:  Diagnosis Date  . Allergy   . Anemia   . Back pain   . Breast cancer ([place]) 2015   LT LUMPECTOMY 12-2014 FOLLOWING CHEMO  . Carpal tunnel syndrome   . Cataract   . Depression   . GERD (gastroesophageal reflux disease)   . Hypertension   . Obesity   . Personal history of chemotherapy 2016  . Pinched nerve    left side, lumbar area  . Uterine cancer ([place]) 1994    Past Surgical History:  Procedure Laterality Date  . ABDOMINAL HYSTERECTOMY  1994   UTERINE CA  . AXILLARY LYMPH NODE BIOPSY Left 12/18/2014   Procedure: AXILLARY LYMPH NODE BIOPSY/;  Surgeon: Robert Bellow, MD;  Location: [facility] ORS;  Service: General;  Laterality: Left;  . AXILLARY LYMPH NODE DISSECTION Left 12/18/2014   Procedure: AXILLARY LYMPH NODE DISSECTION;  Surgeon: Robert Bellow, MD;  Location: [facility] ORS;  Service: General;  Laterality: Left;  . BREAST BIOPSY Left 05/2014   CORE BX OF LN, METASTATIC ADENOCARCINOMA  . BREAST LUMPECTOMY Left 12/2014   CHEMO FIRST THEN SURGERY OF LN  . BREAST SURGERY    . CHOLECYSTECTOMY N/A 04/19/2016   Procedure: LAPAROSCOPIC CHOLECYSTECTOMY WITH INTRAOPERATIVE CHOLANGIOGRAM;  Surgeon: Hubbard Robinson, MD;  Location: [facility] ORS;  Service: General;  Laterality: N/A;  . medial branch block  11/06/2015   lumbar facet Dr. Primus Bravo  . SENTINEL NODE BIOPSY Left 12/18/2014   Procedure: SENTINEL NODE BIOPSY;  Surgeon: Robert Bellow, MD;  Location: [facility] ORS;  Service: General;  Laterality: Left;    Prior to Admission medications   Medication Sig Start Date End Date Taking? Authorizing Provider  acetaminophen (TYLENOL) 500 MG tablet Take 500 mg  by mouth every 6 (six) hours as needed for mild pain or moderate pain.    Yes [provider]  albuterol (PROVENTIL HFA;VENTOLIN HFA) 108 (90 Base) MCG/ACT inhaler Inhale 2 puffs into the lungs every 6 (six) hours as needed for wheezing or shortness of breath. 09/28/17  Yes Trinna Post, PA-C  amLODipine (NORVASC) 10 MG tablet Take 1 tablet (10 mg total) by mouth daily. 11/17/18  Yes Carles Collet M, PA-C  atorvastatin (LIPITOR) 10 MG tablet Take 1 tablet (10 mg total) by mouth daily. 11/17/18  Yes Trinna Post, PA-C  baclofen (LIORESAL) 10 MG tablet Take 1 tablet (10 mg total) by mouth 2 (two) times daily. Patient taking differently: Take 10 mg by mouth 2 (two) times daily. Taking 3 times a day 10/28/17  Yes Pollak, Adriana M, PA-C  cholecalciferol (VITAMIN D) 1000 units tablet Take 1,000 Units by mouth daily.   Yes [provider]  fluticasone (FLONASE) 50 MCG/ACT nasal spray USE 2 SPRAY(S) IN EACH NOSTRIL AS NEEDED FOR ALLERGIES OR RHINITIS. 12/16/18  Yes Carles Collet M, PA-C  gabapentin (NEURONTIN) 300 MG capsule Take 300 mg by mouth daily as needed (neuropathic pain).    Yes [provider]  letrozole (FEMARA) 2.5 MG tablet Take 1 tablet (2.5 mg total) by mouth daily. 07/21/18  Yes [name], MD  Oxycodone HCl 10 MG TABS LIMIT ONE HALF TO ONE TABLET BY MOUTH 3  TO 5 TIMES DAILY IF TOLERATED. TABLET IS 10MG . 09/15/18  Yes [provider]  pyridoxine (B-6) 100 MG tablet Take 100 mg by mouth daily.   Yes [provider]  sertraline (ZOLOFT) 100 MG tablet Take 2 tablets (200 mg total) by mouth daily. 11/17/18 02/15/19 Yes Trinna Post, PA-C  vitamin B-12 (CYANOCOBALAMIN) 500 MCG tablet Take 500 mcg by mouth daily.   Yes [provider]    Allergies as of 12/27/2018  . (No Known Allergies)    Family History  Problem Relation Age of Onset  . Breast cancer Sister 78  . Colon cancer Sister   . Colon cancer Mother   .  Hypertension Mother   . Asthma Mother   . Cancer Father     Social History   Socioeconomic History  . Marital status: Married    Spouse name: Not on file  . Number of children: 3  . Years of education: Not on file  . Highest education level: Some college, no degree  Occupational History  . Occupation: retired    Comment: part time work @ a [employer]  . Financial resource strain: Somewhat hard  . Food insecurity    Worry: Often true    Inability: Often true  . Transportation needs    Medical: No    Non-medical: No  Tobacco Use  . Smoking status: Former Smoker    Packs/day: 0.50    Types: Cigarettes    Quit date: 07/18/2014    Years since quitting: 4.4  . Smokeless tobacco: Never Used  Substance and Sexual Activity  . Alcohol use: No    Alcohol/week: 0.0 standard drinks  . Drug use: No  . Sexual activity: Not on file  Lifestyle  . Physical activity    Days per week: 0 days    Minutes per session: 0 min  . Stress: To some extent  Relationships  . Social Herbalist on phone: Patient refused    Gets together: Patient refused    Attends religious service: Patient refused    Active member of club or organization: Patient refused    Attends meetings of clubs or organizations: Patient refused    Relationship status: Patient refused  . Intimate partner violence    Fear of current or ex partner: Patient refused    Emotionally abused: Patient refused    Physically abused: Patient refused    Forced sexual activity: Patient refused  Other Topics Concern  . Not on file  Social History Narrative  . Not on file    Review of Systems: See HPI, otherwise negative ROS  Physical Exam: BP 109/82   Pulse 77   Temp (!) 97 F (36.1 C)   Resp 17   Ht 5\' 3"  (1.6 m)   Wt 90.7 kg   SpO2 100%   BMI 35.42 kg/m  General:   Alert,  pleasant and cooperative in NAD Head:  Normocephalic and atraumatic. Neck:  Supple; no masses or thyromegaly. Lungs:  Clear  throughout to auscultation.    Heart:  Regular rate and rhythm. Abdomen:  Soft, nontender and nondistended. Normal bowel sounds, without guarding, and without rebound.   Neurologic:  Alert and  oriented x4;  grossly normal neurologically.  Impression/Plan: Carrie Alvarez is here for an colonoscopy to be performed for history of polyps 04/24/2011  Risks, benefits, limitations, and alternatives regarding  colonoscopy have been reviewed with the patient.  Questions have been answered.  All parties agreeable.   Carrie Lame, MD  12/30/2018, 10:03 AM

## 2018-12-30 NOTE — Transfer of Care (Signed)
Immediate Anesthesia Transfer of Care Note  Patient: Norm Salt  Procedure(s) Performed: COLONOSCOPY WITH PROPOFOL (N/A Rectum) POLYPECTOMY (N/A Rectum)  Patient Location: PACU  Anesthesia Type: General  Level of Consciousness: awake, alert  and patient cooperative  Airway and Oxygen Therapy: Patient Spontanous Breathing and Patient connected to supplemental oxygen  Post-op Assessment: Post-op Vital signs reviewed, Patient's Cardiovascular Status Stable, Respiratory Function Stable, Patent Airway and No signs of Nausea or vomiting  Post-op Vital Signs: Reviewed and stable  Complications: No apparent anesthesia complications

## 2018-12-30 NOTE — Anesthesia Postprocedure Evaluation (Signed)
Anesthesia Post Note  Patient: Carrie Alvarez  Procedure(s) Performed: COLONOSCOPY WITH PROPOFOL (N/A Rectum) POLYPECTOMY (N/A Rectum)  Patient location during evaluation: PACU Anesthesia Type: General Level of consciousness: awake and alert, oriented and patient cooperative Pain management: pain level controlled Vital Signs Assessment: post-procedure vital signs reviewed and stable Respiratory status: spontaneous breathing, nonlabored ventilation and respiratory function stable Cardiovascular status: blood pressure returned to baseline and stable Postop Assessment: adequate PO intake Anesthetic complications: no    Darrin Nipper

## 2019-01-02 ENCOUNTER — Encounter: Payer: Self-pay | Admitting: Gastroenterology

## 2019-01-02 ENCOUNTER — Ambulatory Visit (INDEPENDENT_AMBULATORY_CARE_PROVIDER_SITE_OTHER): Payer: Medicare Other | Admitting: Surgery

## 2019-01-02 ENCOUNTER — Emergency Department: Payer: Medicare Other

## 2019-01-02 ENCOUNTER — Other Ambulatory Visit: Payer: Self-pay

## 2019-01-02 ENCOUNTER — Emergency Department
Admission: EM | Admit: 2019-01-02 | Discharge: 2019-01-02 | Disposition: A | Discharge status: Home / Self Care | Payer: Medicare Other | Attending: Emergency Medicine | Admitting: Emergency Medicine

## 2019-01-02 VITALS — BP 147/105 | HR 82 | Temp 97.5°F | Resp 18 | Ht 63.0 in | Wt 198.6 lb

## 2019-01-02 DIAGNOSIS — Z20828 Contact with and (suspected) exposure to other viral communicable diseases: Secondary | ICD-10-CM | POA: Insufficient documentation

## 2019-01-02 DIAGNOSIS — R1030 Lower abdominal pain, unspecified: Secondary | ICD-10-CM | POA: Diagnosis present

## 2019-01-02 DIAGNOSIS — Z8509 Personal history of malignant neoplasm of other digestive organs: Secondary | ICD-10-CM | POA: Diagnosis not present

## 2019-01-02 DIAGNOSIS — D123 Benign neoplasm of transverse colon: Secondary | ICD-10-CM

## 2019-01-02 DIAGNOSIS — Z853 Personal history of malignant neoplasm of breast: Secondary | ICD-10-CM | POA: Insufficient documentation

## 2019-01-02 DIAGNOSIS — C773 Secondary and unspecified malignant neoplasm of axilla and upper limb lymph nodes: Secondary | ICD-10-CM | POA: Insufficient documentation

## 2019-01-02 DIAGNOSIS — Z8541 Personal history of malignant neoplasm of cervix uteri: Secondary | ICD-10-CM | POA: Insufficient documentation

## 2019-01-02 DIAGNOSIS — Z79899 Other long term (current) drug therapy: Secondary | ICD-10-CM | POA: Insufficient documentation

## 2019-01-02 DIAGNOSIS — K9189 Other postprocedural complications and disorders of digestive system: Secondary | ICD-10-CM | POA: Diagnosis not present

## 2019-01-02 DIAGNOSIS — Z87891 Personal history of nicotine dependence: Secondary | ICD-10-CM | POA: Insufficient documentation

## 2019-01-02 DIAGNOSIS — K635 Polyp of colon: Secondary | ICD-10-CM

## 2019-01-02 DIAGNOSIS — I10 Essential (primary) hypertension: Secondary | ICD-10-CM | POA: Insufficient documentation

## 2019-01-02 LAB — COMPREHENSIVE METABOLIC PANEL
ALT: 17 U/L (ref 0–44)
AST: 25 U/L (ref 15–41)
Albumin: 4.5 g/dL (ref 3.5–5.0)
Alkaline Phosphatase: 82 U/L (ref 38–126)
Anion gap: 15 (ref 5–15)
BUN: 16 mg/dL (ref 8–23)
CO2: 21 mmol/L — ABNORMAL LOW (ref 22–32)
Calcium: 10.3 mg/dL (ref 8.9–10.3)
Chloride: 107 mmol/L (ref 98–111)
Creatinine, Ser: 0.91 mg/dL (ref 0.44–1.00)
GFR calc Af Amer: >60 mL/min (ref >60)
GFR calc non Af Amer: >60 mL/min (ref >60)
Glucose, Bld: 88 mg/dL (ref 70–99)
Potassium: 3.7 mmol/L (ref 3.5–5.1)
Sodium: 143 mmol/L (ref 135–145)
Total Bilirubin: 1.5 mg/dL — ABNORMAL HIGH (ref 0.3–1.2)
Total Protein: 8.3 g/dL — ABNORMAL HIGH (ref 6.5–8.1)

## 2019-01-02 LAB — CBC WITH DIFFERENTIAL/PLATELET
Abs Immature Granulocytes: 0.08 10*3/uL — ABNORMAL HIGH (ref 0.00–0.07)
Basophils Absolute: 0 10*3/uL (ref 0.0–0.1)
Basophils Relative: 0 %
Eosinophils Absolute: 0 10*3/uL (ref 0.0–0.5)
Eosinophils Relative: 0 %
HCT: 37.6 % (ref 36.0–46.0)
Hemoglobin: 13 g/dL (ref 12.0–15.0)
Immature Granulocytes: 1 %
Lymphocytes Relative: 14 %
Lymphs Abs: 1.7 10*3/uL (ref 0.7–4.0)
MCH: 27.4 pg (ref 26.0–34.0)
MCHC: 34.6 g/dL (ref 30.0–36.0)
MCV: 79.3 fL — ABNORMAL LOW (ref 80.0–100.0)
Monocytes Absolute: 0.7 10*3/uL (ref 0.1–1.0)
Monocytes Relative: 5 %
Neutro Abs: 10 10*3/uL — ABNORMAL HIGH (ref 1.7–7.7)
Neutrophils Relative %: 80 %
Platelets: 238 10*3/uL (ref 150–400)
RBC: 4.74 MIL/uL (ref 3.87–5.11)
RDW: 13.9 % (ref 11.5–15.5)
WBC: 12.4 10*3/uL — ABNORMAL HIGH (ref 4.0–10.5)
nRBC: 0 % (ref 0.0–0.2)

## 2019-01-02 LAB — PROTIME-INR
INR: 1.3 — ABNORMAL HIGH (ref 0.8–1.2)
Prothrombin Time: 15.7 seconds — ABNORMAL HIGH (ref 11.4–15.2)

## 2019-01-02 LAB — SARS CORONAVIRUS 2 BY RT PCR (HOSPITAL ORDER, PERFORMED IN ~~LOC~~ HOSPITAL LAB): SARS Coronavirus 2: NEGATIVE

## 2019-01-02 LAB — APTT: aPTT: 29 seconds (ref 24–36)

## 2019-01-02 MED ORDER — IOHEXOL 300 MG/ML  SOLN
100.0000 mL | Freq: Once | INTRAMUSCULAR | Status: AC | PRN
  Administered 2019-01-02: 100 mL via INTRAVENOUS

## 2019-01-02 MED ORDER — ONDANSETRON HCL 4 MG/2ML IJ SOLN
4.0000 mg | Freq: Once | INTRAMUSCULAR | Status: AC
  Administered 2019-01-02: 4 mg via INTRAVENOUS
  Filled 2019-01-02: qty 2

## 2019-01-02 MED ORDER — AMOXICILLIN-POT CLAVULANATE 875-125 MG PO TABS: 1.0000 | ORAL_TABLET | Freq: Two times a day (BID) | ORAL | 0 refills | Status: AC

## 2019-01-02 MED ORDER — TRAMADOL HCL 50 MG PO TABS: 50.0000 mg | ORAL_TABLET | Freq: Four times a day (QID) | ORAL | 0 refills | Status: DC | PRN

## 2019-01-02 MED ORDER — SODIUM CHLORIDE 0.9 % IV BOLUS
1000.0000 mL | Freq: Once | INTRAVENOUS | Status: AC
  Administered 2019-01-02: 1000 mL via INTRAVENOUS

## 2019-01-02 MED ORDER — MORPHINE SULFATE (PF) 4 MG/ML IV SOLN
4.0000 mg | Freq: Once | INTRAVENOUS | Status: AC
  Administered 2019-01-02: 4 mg via INTRAVENOUS
  Filled 2019-01-02: qty 1

## 2019-01-02 NOTE — ED Provider Notes (Signed)
[facility] Emergency Department Provider Note   ____________________________________________    I have reviewed the triage vital signs and the nursing notes.   HISTORY  Chief Complaint Abdominal pain    HPI Carrie Alvarez is a 68 y.o. female who presents to the emergency department for abdominal pain.  Patient reports he had a colonoscopy performed on Friday, over the weekend she developed diffuse moderate to severe abdominal pain.  She saw her general surgeon today who referred her to the emergency department.  I spoke with Dr. Dahlia Byes who requested stat coronavirus testing, acute abdomen x-ray as he is concerned about possible perforation.  Afebrile.  Has not taken anything for this.  Past Medical History:  Diagnosis Date  . Allergy   . Anemia   . Back pain   . Breast cancer ([place]) 2015   LT LUMPECTOMY 12-2014 FOLLOWING CHEMO  . Carpal tunnel syndrome   . Cataract   . Depression   . GERD (gastroesophageal reflux disease)   . Hypertension   . Obesity   . Personal history of chemotherapy 2016  . Pinched nerve    left side, lumbar area  . Uterine cancer [facility]) 1994    Patient Active Problem List   Diagnosis Date Noted  . Personal history of colonic polyps   . Polyp of transverse colon   . Neoplasm of digestive system   . Morbid obesity ([place]) 06/07/2018  . Acute cholecystitis   . Transaminitis   . Cholangitis 04/18/2016  . Allergic rhinitis 09/04/2015  . AA (alopecia areata) 09/04/2015  . Arthritis, degenerative 09/04/2015  . Cancer of uterus ([place]) 09/04/2015  . Avitaminosis D 09/04/2015  . Benign positional vertigo 09/04/2015  . Hypertension 03/15/2015  . DDD (degenerative disc disease), lumbar 03/07/2015  . Dizziness 12/25/2014  . Lumbosacral facet joint syndrome 12/10/2014  . Sacroiliac joint dysfunction 12/10/2014  . DDD (degenerative disc disease), lumbosacral 11/13/2014  . Depression 10/13/2014  . Breast cancer  metastasized to axillary lymph node ([place]) 06/21/2014  . Hypercholesteremia 08/30/2009  . Compulsive tobacco user syndrome 04/21/2008  . Adaptation reaction 07/18/2007    Past Surgical History:  Procedure Laterality Date  . ABDOMINAL HYSTERECTOMY  1994   UTERINE CA  . AXILLARY LYMPH NODE BIOPSY Left 12/18/2014   Procedure: AXILLARY LYMPH NODE BIOPSY/;  Surgeon: Azarya Oconnell Bellow, MD;  Location: [facility];  Service: General;  Laterality: Left;  . AXILLARY LYMPH NODE DISSECTION Left 12/18/2014   Procedure: AXILLARY LYMPH NODE DISSECTION;  Surgeon: Latrisha Coiro Bellow, MD;  Location: [facility];  Service: General;  Laterality: Left;  . BREAST BIOPSY Left 05/2014   CORE BX OF LN, METASTATIC ADENOCARCINOMA  . BREAST LUMPECTOMY Left 12/2014   CHEMO FIRST THEN SURGERY OF LN  . BREAST SURGERY    . CHOLECYSTECTOMY N/A 04/19/2016   Procedure: LAPAROSCOPIC CHOLECYSTECTOMY WITH INTRAOPERATIVE CHOLANGIOGRAM;  Surgeon: Hubbard Robinson, MD;  Location: [facility];  Service: General;  Laterality: N/A;  . COLONOSCOPY WITH PROPOFOL N/A 12/30/2018   Procedure: COLONOSCOPY WITH PROPOFOL;  Surgeon: Lucilla Lame, MD;  Location: [place];  Service: Endoscopy;  Laterality: N/A;  . medial branch block  11/06/2015   lumbar facet Dr. Primus Bravo  . POLYPECTOMY N/A 12/30/2018   Procedure: POLYPECTOMY;  Surgeon: Lucilla Lame, MD;  Location: [place];  Service: Endoscopy;  Laterality: N/A;  Clips placed at Hepatic Flexure Polyp (2) and Transverse Colon Polyp (4) removal sites  . SENTINEL NODE BIOPSY Left 12/18/2014   Procedure:  SENTINEL NODE BIOPSY;  Surgeon: Jorja Empie Bellow, MD;  Location: [facility];  Service: General;  Laterality: Left;    Prior to Admission medications   Medication Sig Start Date End Date Taking? Authorizing Provider  acetaminophen (TYLENOL) 500 MG tablet Take 500 mg by mouth every 6 (six) hours as needed for mild pain or moderate pain.     [provider]  albuterol (PROVENTIL  HFA;VENTOLIN HFA) 108 (90 Base) MCG/ACT inhaler Inhale 2 puffs into the lungs every 6 (six) hours as needed for wheezing or shortness of breath. 09/28/17   Trinna Post, PA-C  amLODipine (NORVASC) 10 MG tablet Take 1 tablet (10 mg total) by mouth daily. 11/17/18   Trinna Post, PA-C  amoxicillin-clavulanate (AUGMENTIN) 875-125 MG tablet Take 1 tablet by mouth 2 (two) times daily for 7 days. 01/02/19 01/09/19  Lavonia Drafts, MD  atorvastatin (LIPITOR) 10 MG tablet Take 1 tablet (10 mg total) by mouth daily. 11/17/18   Trinna Post, PA-C  baclofen (LIORESAL) 10 MG tablet Take 1 tablet (10 mg total) by mouth 2 (two) times daily. Patient taking differently: Take 10 mg by mouth 2 (two) times daily. Taking 3 times a day 10/28/17   Trinna Post, PA-C  cholecalciferol (VITAMIN D) 1000 units tablet Take 1,000 Units by mouth daily.    [provider]  fluticasone (FLONASE) 50 MCG/ACT nasal spray USE 2 SPRAY(S) IN EACH NOSTRIL AS NEEDED FOR ALLERGIES OR RHINITIS. 12/16/18   Trinna Post, PA-C  gabapentin (NEURONTIN) 300 MG capsule Take 300 mg by mouth daily as needed (neuropathic pain).     [provider]  letrozole (FEMARA) 2.5 MG tablet Take 1 tablet (2.5 mg total) by mouth daily. 07/21/18   [name], MD  Oxycodone HCl 10 MG TABS LIMIT ONE HALF TO ONE TABLET BY MOUTH 3 TO 5 TIMES DAILY IF TOLERATED. TABLET IS 10MG . 09/15/18   [provider]  pyridoxine (B-6) 100 MG tablet Take 100 mg by mouth daily.    [provider]  sertraline (ZOLOFT) 100 MG tablet Take 2 tablets (200 mg total) by mouth daily. 11/17/18 02/15/19  Trinna Post, PA-C  traMADol (ULTRAM) 50 MG tablet Take 1 tablet (50 mg total) by mouth every 6 (six) hours as needed. 01/02/19 01/02/20  Lavonia Drafts, MD  vitamin B-12 (CYANOCOBALAMIN) 500 MCG tablet Take 500 mcg by mouth daily.    [provider]     Allergies Patient has no known allergies.  Family History  Problem  Relation Age of Onset  . Breast cancer Sister 53  . Colon cancer Sister   . Colon cancer Mother   . Hypertension Mother   . Asthma Mother   . Cancer Father     Social History Social History   Tobacco Use  . Smoking status: Former Smoker    Packs/day: 0.50    Types: Cigarettes    Quit date: 07/18/2014    Years since quitting: 4.4  . Smokeless tobacco: Never Used  Substance Use Topics  . Alcohol use: No    Alcohol/week: 0.0 standard drinks  . Drug use: No    Review of Systems  Constitutional: No fever/chills Eyes: No visual changes.  ENT: No sore throat. Cardiovascular: Denies chest pain. Respiratory: Denies shortness of breath. Gastrointestinal: As above Genitourinary: Negative for dysuria. Musculoskeletal: Negative for back pain. Skin: Negative for rash. Neurological: Negative for headaches    ____________________________________________   PHYSICAL EXAM:  VITAL SIGNS: ED Triage Vitals  Enc Vitals Group     BP 01/02/19 1149 118/80     Pulse Rate 01/02/19 1146 84     Resp 01/02/19 1146 20     Temp 01/02/19 1146 98.2 F (36.8 C)     Temp Source 01/02/19 1146 Oral     SpO2 01/02/19 1146 100 %     Weight 01/02/19 1146 89.8 kg (198 lb)     Height 01/02/19 1146 1.6 m (5\' 3" )     Head Circumference --      Peak Flow --      Pain Score 01/02/19 1146 8     Pain Loc --      Pain Edu? --      Excl. in [place]? --     Constitutional: Alert and oriented.  Eyes: Conjunctivae are normal.   Nose: No congestion/rhinnorhea. Mouth/Throat: Mucous membranes are moist.    Cardiovascular: Normal rate, regular rhythm. Grossly normal heart sounds.  Good peripheral circulation. Respiratory: Normal respiratory effort.  No retractions. Lungs CTAB. Gastrointestinal: Tenderness palpation primarily in the lower abdomen, mild distention  Musculoskeletal:  Warm and well perfused Neurologic:  Normal speech and language. No gross focal neurologic deficits are appreciated.  Skin:   Skin is warm, dry and intact. No rash noted. Psychiatric: Mood and affect are normal. Speech and behavior are normal.  ____________________________________________   LABS (all labs ordered are listed, but only abnormal results are displayed)  Labs Reviewed  CBC WITH DIFFERENTIAL/PLATELET - Abnormal; Notable for the following components:      Result Value   WBC 12.4 (*)    MCV 79.3 (*)    Neutro Abs 10.0 (*)    Abs Immature Granulocytes 0.08 (*)    All other components within normal limits  COMPREHENSIVE METABOLIC PANEL - Abnormal; Notable for the following components:   CO2 21 (*)    Total Protein 8.3 (*)    Total Bilirubin 1.5 (*)    All other components within normal limits  PROTIME-INR - Abnormal; Notable for the following components:   Prothrombin Time 15.7 (*)    INR 1.3 (*)    All other components within normal limits  SARS CORONAVIRUS 2 (HOSPITAL ORDER, [place] [facility])  APTT   ____________________________________________  EKG   ____________________________________________  RADIOLOGY  Abdomen acute with chest no acute distress, no free air, viewed by me ____________________________________________   PROCEDURES  Procedure(s) performed: No  Procedures   Critical Care performed: No ____________________________________________   INITIAL IMPRESSION / ASSESSMENT AND PLAN / ED COURSE  Pertinent labs & imaging results that were available during my care of the patient were reviewed by me and considered in my medical decision making (see chart for details).  Patient presents with developed of abdominal pain status post colonoscopy, certainly concerning for perforation especially given significant abdominal tenderness now.  Acute abdomen is overall reassuring no evidence of free air.  Lab work significant for elevated white blood cell count, will discuss with Dr. Dahlia Byes, have treated the patient with IV morphine, IV Zofran with some  improvement   Patient seen by Dr. Dahlia Byes in the emergency department, he ordered a CT scan which is overall unremarkable.  Patient is feeling much better.  Discussed with her and she would strongly like to go home, we will start her on antibiotics to Dr. Barb Merino suggestion as well as pain medications, per Dr. Adora Fridge will see her in 2 days in his office, return precautions to the emergency department discussed. Dr.  Pabon d/w Dr. Allen Norris, suspicion for post-polypectomy syndrome    ____________________________________________   FINAL CLINICAL IMPRESSION(S) / ED DIAGNOSES  Final diagnoses:  Lower abdominal pain        Note:  This document was prepared using Dragon voice recognition software and may include unintentional dictation errors.   Lavonia Drafts, MD 01/02/19 1434

## 2019-01-02 NOTE — ED Notes (Signed)
PT provided her cell phone

## 2019-01-02 NOTE — ED Triage Notes (Signed)
PT to ED via EMS from surgery center. PT had colonoscopy on Friday and started having sever abd pain and NV Saturday. PT appears in pain but is AO.

## 2019-01-02 NOTE — Consult Note (Signed)
Patient ID: Carrie Alvarez, female   DOB: 1950-08-23, 68 y.o.   MRN: [id number]  HPI Carrie Alvarez is a 68 y.o. female known to me I saw her this morning due to abdominal pain after colonoscopy polypectomy by Dr.Wohl.  I was concerned for perforation.  She had abdominal x-ray as well as a CT scan of the abdomen and pelvis that I have personally reviewed showing no evidence of free air.  There is changes consistent with polypectomy.  White count is slightly elevated to 12,000.  Did receive morphine with significant improvement of symptoms.  Her Am[place] has completely resolved and she feels much better.  I have also discussed the case with Dr.Wohl both agree that this picture is more consistent with post polypectomy syndrome.  Her vitals have been stable and she has been afebrile  HPI  Past Medical History:  Diagnosis Date  . Allergy   . Anemia   . Back pain   . Breast cancer ([place]) 2015   LT LUMPECTOMY 12-2014 FOLLOWING CHEMO  . Carpal tunnel syndrome   . Cataract   . Depression   . GERD (gastroesophageal reflux disease)   . Hypertension   . Obesity   . Personal history of chemotherapy 2016  . Pinched nerve    left side, lumbar area  . Uterine cancer ([place]) 1994    Past Surgical History:  Procedure Laterality Date  . ABDOMINAL HYSTERECTOMY  1994   UTERINE CA  . AXILLARY LYMPH NODE BIOPSY Left 12/18/2014   Procedure: AXILLARY LYMPH NODE BIOPSY/;  Surgeon: Robert Bellow, MD;  Location: [facility];  Service: General;  Laterality: Left;  . AXILLARY LYMPH NODE DISSECTION Left 12/18/2014   Procedure: AXILLARY LYMPH NODE DISSECTION;  Surgeon: Robert Bellow, MD;  Location: [facility];  Service: General;  Laterality: Left;  . BREAST BIOPSY Left 05/2014   CORE BX OF LN, METASTATIC ADENOCARCINOMA  . BREAST LUMPECTOMY Left 12/2014   CHEMO FIRST THEN SURGERY OF LN  . BREAST SURGERY    . CHOLECYSTECTOMY N/A 04/19/2016   Procedure: LAPAROSCOPIC CHOLECYSTECTOMY WITH INTRAOPERATIVE  CHOLANGIOGRAM;  Surgeon: Hubbard Robinson, MD;  Location: [facility];  Service: General;  Laterality: N/A;  . COLONOSCOPY WITH PROPOFOL N/A 12/30/2018   Procedure: COLONOSCOPY WITH PROPOFOL;  Surgeon: Lucilla Lame, MD;  Location: [place];  Service: Endoscopy;  Laterality: N/A;  . medial branch block  11/06/2015   lumbar facet Dr. Primus Bravo  . POLYPECTOMY N/A 12/30/2018   Procedure: POLYPECTOMY;  Surgeon: Lucilla Lame, MD;  Location: [place];  Service: Endoscopy;  Laterality: N/A;  Clips placed at Hepatic Flexure Polyp (2) and Transverse Colon Polyp (4) removal sites  . SENTINEL NODE BIOPSY Left 12/18/2014   Procedure: SENTINEL NODE BIOPSY;  Surgeon: Robert Bellow, MD;  Location: [facility];  Service: General;  Laterality: Left;    Family History  Problem Relation Age of Onset  . Breast cancer Sister 49  . Colon cancer Sister   . Colon cancer Mother   . Hypertension Mother   . Asthma Mother   . Cancer Father     Social History Social History   Tobacco Use  . Smoking status: Former Smoker    Packs/day: 0.50    Types: Cigarettes    Quit date: 07/18/2014    Years since quitting: 4.4  . Smokeless tobacco: Never Used  Substance Use Topics  . Alcohol use: No    Alcohol/week: 0.0 standard drinks  . Drug use: No  No Known Allergies  No current facility-administered medications for this encounter.    Current Outpatient Medications  Medication Sig Dispense Refill  . acetaminophen (TYLENOL) 500 MG tablet Take 500 mg by mouth every 6 (six) hours as needed for mild pain or moderate pain.     Marland Kitchen albuterol (PROVENTIL HFA;VENTOLIN HFA) 108 (90 Base) MCG/ACT inhaler Inhale 2 puffs into the lungs every 6 (six) hours as needed for wheezing or shortness of breath. 1 Inhaler 2  . amLODipine (NORVASC) 10 MG tablet Take 1 tablet (10 mg total) by mouth daily. 90 tablet 0  . atorvastatin (LIPITOR) 10 MG tablet Take 1 tablet (10 mg total) by mouth daily. 90 tablet 0  . baclofen  (LIORESAL) 10 MG tablet Take 1 tablet (10 mg total) by mouth 2 (two) times daily. (Patient taking differently: Take 10 mg by mouth 2 (two) times daily. Taking 3 times a day) 30 each 0  . cholecalciferol (VITAMIN D) 1000 units tablet Take 1,000 Units by mouth daily.    . fluticasone (FLONASE) 50 MCG/ACT nasal spray USE 2 SPRAY(S) IN EACH NOSTRIL AS NEEDED FOR ALLERGIES OR RHINITIS. 16 g 0  . gabapentin (NEURONTIN) 300 MG capsule Take 300 mg by mouth daily as needed (neuropathic pain).     Marland Kitchen letrozole (FEMARA) 2.5 MG tablet Take 1 tablet (2.5 mg total) by mouth daily. 90 tablet 3  . Oxycodone HCl 10 MG TABS LIMIT ONE HALF TO ONE TABLET BY MOUTH 3 TO 5 TIMES DAILY IF TOLERATED. TABLET IS 10MG .    . pyridoxine (B-6) 100 MG tablet Take 100 mg by mouth daily.    . sertraline (ZOLOFT) 100 MG tablet Take 2 tablets (200 mg total) by mouth daily. 180 tablet 0  . vitamin B-12 (CYANOCOBALAMIN) 500 MCG tablet Take 500 mcg by mouth daily.     Facility-Administered Medications Ordered in Other Encounters  Medication Dose Route Frequency Provider Last Rate Last Dose  . heparin lock flush 100 unit/mL  500 Units Intravenous Once [name], MD         Review of Systems Full ROS  was asked and was negative except for the information on the HPI  Physical Exam Blood pressure 118/80, pulse 84, temperature 98.2 F (36.8 C), temperature source Oral, resp. rate 20, height 5\' 3"  (1.6 m), weight 89.8 kg, SpO2 100 %. CONSTITUTIONAL: NAD EYES: Pupils are equal, round, and reactive to light, Sclera are non-icteric. EARS, NOSE, MOUTH AND THROAT: The oropharynx is clear. The oral mucosa is pink and moist. Hearing is intact to voice. LYMPH NODES:  Lymph nodes in the neck are normal. RESPIRATORY:  Lungs are clear. There is normal respiratory effort, with equal breath sounds bilaterally, and without pathologic use of accessory muscles. CARDIOVASCULAR: Heart is regular without murmurs, gallops, or rubs. GI: The  abdomen is  soft, nontender, and nondistended. There are no palpable masses. There is no hepatosplenomegaly. There are normal bowel sounds in all quadrants. GU: Rectal deferred.   MUSCULOSKELETAL: Normal muscle strength and tone. No cyanosis or edema.   SKIN: Turgor is good and there are no pathologic skin lesions or ulcers. NEUROLOGIC: Motor and sensation is grossly normal. Cranial nerves are grossly intact. PSYCH:  Oriented to person, place and time. Affect is normal.  Data Reviewed  I have personally reviewed the patient's imaging, laboratory findings and medical records.    Assessment/Plan   68 year old female status post polypectomy and colonoscopy now with findings consistent with post polypectomy syndrome.  Recommend n.p.o.  serial abdominal exams and ideally admission overnight.  No need for surgical intervention at this time.  Her abdomen has improved significantly.  She is still in need for extended right colectomy in the near future.  Findings were discussed with Dr. Corky Downs and the patient in detail. Case d/w Dr. Allen Norris in detail.    Caroleen Hamman, MD FACS General Surgeon 01/02/2019, 2:06 PM

## 2019-01-02 NOTE — ED Notes (Signed)
IV attempt x2 by Silvio Clayman

## 2019-01-02 NOTE — Patient Instructions (Signed)
Patient transported to emergency room via EMS.

## 2019-01-02 NOTE — ED Notes (Signed)
Patient transported to X-ray 

## 2019-01-02 NOTE — Progress Notes (Signed)
Patient ID: Carrie Alvarez, female   DOB: 06/25/1951, 68 y.o.   MRN: [id number]  HPI Carrie Alvarez is a 68 y.o. female to see in consultation at the request of Dr. Allen Norris for splenic flexure mass and multiple polyps status post polypectomy last Friday.  She reports that ever since her colonoscopy she is experiencing abdominal pain.  Now the pain is about an 8 or 9 out of 10 a sharp is constant.  There is no aggravating or elevating factors.  No fevers or chills no respiratory symptoms.  She did have episode of emesis.  No blood per rectum no melena.  Endoscopy report and images personally reviewed there is evidence of splenic mass lesion and it was tattooed.  There is also multiple polyps on the right and transverse colons that were removed.  Clips were applied as well. She does have a creatinine of 1.23 the rest of the labs are normal.  Did have a CT scan of the abdomen last year that was unremarkable and I personally reviewed it HPI  Past Medical History:  Diagnosis Date  . Allergy   . Anemia   . Back pain   . Breast cancer ([place]) 2015   LT LUMPECTOMY 12-2014 FOLLOWING CHEMO  . Carpal tunnel syndrome   . Cataract   . Depression   . GERD (gastroesophageal reflux disease)   . Hypertension   . Obesity   . Personal history of chemotherapy 2016  . Pinched nerve    left side, lumbar area  . Uterine cancer ([place]) 1994    Past Surgical History:  Procedure Laterality Date  . ABDOMINAL HYSTERECTOMY  1994   UTERINE CA  . AXILLARY LYMPH NODE BIOPSY Left 12/18/2014   Procedure: AXILLARY LYMPH NODE BIOPSY/;  Surgeon: Robert Bellow, MD;  Location: [facility];  Service: General;  Laterality: Left;  . AXILLARY LYMPH NODE DISSECTION Left 12/18/2014   Procedure: AXILLARY LYMPH NODE DISSECTION;  Surgeon: Robert Bellow, MD;  Location: [facility];  Service: General;  Laterality: Left;  . BREAST BIOPSY Left 05/2014   CORE BX OF LN, METASTATIC ADENOCARCINOMA  . BREAST LUMPECTOMY Left 12/2014    CHEMO FIRST THEN SURGERY OF LN  . BREAST SURGERY    . CHOLECYSTECTOMY N/A 04/19/2016   Procedure: LAPAROSCOPIC CHOLECYSTECTOMY WITH INTRAOPERATIVE CHOLANGIOGRAM;  Surgeon: Hubbard Robinson, MD;  Location: [facility];  Service: General;  Laterality: N/A;  . COLONOSCOPY WITH PROPOFOL N/A 12/30/2018   Procedure: COLONOSCOPY WITH PROPOFOL;  Surgeon: Lucilla Lame, MD;  Location: [facility];  Service: Endoscopy;  Laterality: N/A;  . medial branch block  11/06/2015   lumbar facet Dr. Primus Bravo  . POLYPECTOMY N/A 12/30/2018   Procedure: POLYPECTOMY;  Surgeon: Lucilla Lame, MD;  Location: [facility];  Service: Endoscopy;  Laterality: N/A;  Clips placed at Hepatic Flexure Polyp (2) and Transverse Colon Polyp (4) removal sites  . SENTINEL NODE BIOPSY Left 12/18/2014   Procedure: SENTINEL NODE BIOPSY;  Surgeon: Robert Bellow, MD;  Location: [facility];  Service: General;  Laterality: Left;    Family History  Problem Relation Age of Onset  . Breast cancer Sister 10  . Colon cancer Sister   . Colon cancer Mother   . Hypertension Mother   . Asthma Mother   . Cancer Father     Social History Social History   Tobacco Use  . Smoking status: Former Smoker    Packs/day: 0.50    Types: Cigarettes    Quit date:  07/18/2014    Years since quitting: 4.4  . Smokeless tobacco: Never Used  Substance Use Topics  . Alcohol use: No    Alcohol/week: 0.0 standard drinks  . Drug use: No    No Known Allergies  Current Outpatient Medications  Medication Sig Dispense Refill  . acetaminophen (TYLENOL) 500 MG tablet Take 500 mg by mouth every 6 (six) hours as needed for mild pain or moderate pain.     Marland Kitchen albuterol (PROVENTIL HFA;VENTOLIN HFA) 108 (90 Base) MCG/ACT inhaler Inhale 2 puffs into the lungs every 6 (six) hours as needed for wheezing or shortness of breath. 1 Inhaler 2  . amLODipine (NORVASC) 10 MG tablet Take 1 tablet (10 mg total) by mouth daily. 90 tablet 0  . atorvastatin (LIPITOR) 10  MG tablet Take 1 tablet (10 mg total) by mouth daily. 90 tablet 0  . baclofen (LIORESAL) 10 MG tablet Take 1 tablet (10 mg total) by mouth 2 (two) times daily. (Patient taking differently: Take 10 mg by mouth 2 (two) times daily. Taking 3 times a day) 30 each 0  . cholecalciferol (VITAMIN D) 1000 units tablet Take 1,000 Units by mouth daily.    . fluticasone (FLONASE) 50 MCG/ACT nasal spray USE 2 SPRAY(S) IN EACH NOSTRIL AS NEEDED FOR ALLERGIES OR RHINITIS. 16 g 0  . gabapentin (NEURONTIN) 300 MG capsule Take 300 mg by mouth daily as needed (neuropathic pain).     Marland Kitchen letrozole (FEMARA) 2.5 MG tablet Take 1 tablet (2.5 mg total) by mouth daily. 90 tablet 3  . Oxycodone HCl 10 MG TABS LIMIT ONE HALF TO ONE TABLET BY MOUTH 3 TO 5 TIMES DAILY IF TOLERATED. TABLET IS 10MG .    . pyridoxine (B-6) 100 MG tablet Take 100 mg by mouth daily.    . sertraline (ZOLOFT) 100 MG tablet Take 2 tablets (200 mg total) by mouth daily. 180 tablet 0  . vitamin B-12 (CYANOCOBALAMIN) 500 MCG tablet Take 500 mcg by mouth daily.     No current facility-administered medications for this visit.    Facility-Administered Medications Ordered in Other Visits  Medication Dose Route Frequency Provider Last Rate Last Dose  . heparin lock flush 100 unit/mL  500 Units Intravenous Once [name], MD         Review of Systems Full ROS  was asked and was negative except for the information on the HPI  Physical Exam Blood pressure (!) 147/105, pulse 82, temperature (!) 97.5 F (36.4 C), temperature source Temporal, resp. rate 18, height 5\' 3"  (1.6 m), weight 198 lb 9.6 oz (90.1 kg), SpO2 98 %. CONSTITUTIONAL: She is moaning in pain EYES: Pupils are equal, round, and reactive to light, Sclera are non-icteric. EARS, NOSE, MOUTH AND THROAT: The oropharynx is clear. The oral mucosa is pink and moist. Hearing is intact to voice. LYMPH NODES:  Lymph nodes in the neck are normal. RESPIRATORY:  Lungs are clear. There is normal  respiratory effort, with equal breath sounds bilaterally, and without pathologic use of accessory muscles. CARDIOVASCULAR: Heart is regular without murmurs, gallops, or rubs. GI: The abdomen  is very tender to palpation with positive rebound peritonitis GU: Rectal deferred.   MUSCULOSKELETAL: Normal muscle strength and tone. No cyanosis or edema.   SKIN: Turgor is good and there are no pathologic skin lesions or ulcers. NEUROLOGIC: Motor and sensation is grossly normal. Cranial nerves are grossly intact. PSYCH:  Oriented to person, place and time. Affect is normal.  Data Reviewed  I  have personally reviewed the patient's imaging, laboratory findings and medical records.    Assessment/Plan 68 year old female status post colonoscopy and polypectomies continue finding of splenic flexure mass now comes with severe abdominal pain nausea and vomiting very suspicious for colonic perforation.  She is in so much pain that I think the safest thing is to call EMS and get her to the emergency room as soon as possible.  I do anticipate that she will require exploratory laparotomy with colectomy. Discussed with the patient and the husband in detail about my thought process.  As I said the first thing is to get her safe to the emergency room, we resuscitate her, start antibiotics and do further  Work up and potentially perform a prompt surgery. EMS called and d/w them in detail. We will call the ER as well   Caroleen Hamman, MD [facility] Surgeon 01/02/2019, 11:26 AM

## 2019-01-03 ENCOUNTER — Other Ambulatory Visit: Payer: Self-pay | Admitting: Anatomic Pathology & Clinical Pathology

## 2019-01-03 ENCOUNTER — Telehealth: Payer: Self-pay | Admitting: *Deleted

## 2019-01-03 LAB — SURGICAL PATHOLOGY

## 2019-01-03 NOTE — Telephone Encounter (Signed)
Please make sure she keeps her appointment per Dr Dahlia Byes.

## 2019-01-04 ENCOUNTER — Other Ambulatory Visit: Payer: Self-pay | Admitting: *Deleted

## 2019-01-04 ENCOUNTER — Ambulatory Visit (INDEPENDENT_AMBULATORY_CARE_PROVIDER_SITE_OTHER): Payer: Medicare Other | Admitting: Surgery

## 2019-01-04 ENCOUNTER — Telehealth: Payer: Self-pay | Admitting: *Deleted

## 2019-01-04 ENCOUNTER — Encounter: Payer: Self-pay | Admitting: Surgery

## 2019-01-04 ENCOUNTER — Other Ambulatory Visit: Payer: Self-pay

## 2019-01-04 VITALS — BP 130/90 | HR 79 | Temp 97.3°F | Resp 16 | Ht 63.0 in | Wt 192.0 lb

## 2019-01-04 DIAGNOSIS — C184 Malignant neoplasm of transverse colon: Secondary | ICD-10-CM

## 2019-01-04 MED ORDER — ERYTHROMYCIN 500 MG PO TBEC: 500.0000 mg | DELAYED_RELEASE_TABLET | ORAL | 0 refills | Status: DC

## 2019-01-04 MED ORDER — NEOMYCIN SULFATE 500 MG PO TABS: ORAL_TABLET | ORAL | 0 refills | Status: DC

## 2019-01-04 MED ORDER — BISACODYL EC 5 MG PO TBEC: DELAYED_RELEASE_TABLET | ORAL | 0 refills | Status: DC

## 2019-01-04 MED ORDER — POLYETHYLENE GLYCOL 3350 17 GM/SCOOP PO POWD: ORAL | 0 refills | Status: DC

## 2019-01-04 MED ORDER — ONDANSETRON 4 MG PO TBDP: 4.0000 mg | ORAL_TABLET | Freq: Three times a day (TID) | ORAL | 0 refills | Status: DC | PRN

## 2019-01-04 NOTE — Progress Notes (Signed)
Outpatient Surgical Follow Up  01/04/2019  Carrie Alvarez is an 68 y.o. female.   Chief Complaint  Patient presents with  . Follow-up    Colonoscopy Seen in ED    HPI: 68 year old well-known to me with a recently diagnosed splenic flexure adenocarcinoma.  Pathology came back and I have discussed with the patient in detail.  Additionally she did have a transverse colon polyp consistent with high-grade dysplasia.  She did develop post polypectomy syndrome and is recovering well but is slowly.  She is able to take p.o.  No fevers no chills she does have abdominal pain mainly in the right lower quadrant but not as severe as when I saw her 2 days ago. She Does have chronic pain. The scan personally reviewed there is no evidence of perforation and there is no evidence of metastatic disease.  I discussed both the CT and also the final pathology findings with the patient and the husband in detail  Past Medical History:  Diagnosis Date  . Allergy   . Anemia   . Back pain   . Breast cancer ([place]) 2015   LT LUMPECTOMY 12-2014 FOLLOWING CHEMO  . Carpal tunnel syndrome   . Cataract   . Depression   . GERD (gastroesophageal reflux disease)   . Hypertension   . Obesity   . Personal history of chemotherapy 2016  . Pinched nerve    left side, lumbar area  . Uterine cancer ([place]) 1994    Past Surgical History:  Procedure Laterality Date  . ABDOMINAL HYSTERECTOMY  1994   UTERINE CA  . AXILLARY LYMPH NODE BIOPSY Left 12/18/2014   Procedure: AXILLARY LYMPH NODE BIOPSY/;  Surgeon: Robert Bellow, MD;  Location: [facility];  Service: General;  Laterality: Left;  . AXILLARY LYMPH NODE DISSECTION Left 12/18/2014   Procedure: AXILLARY LYMPH NODE DISSECTION;  Surgeon: Robert Bellow, MD;  Location: [facility];  Service: General;  Laterality: Left;  . BREAST BIOPSY Left 05/2014   CORE BX OF LN, METASTATIC ADENOCARCINOMA  . BREAST LUMPECTOMY Left 12/2014   CHEMO FIRST THEN SURGERY OF LN  . BREAST  SURGERY    . CHOLECYSTECTOMY N/A 04/19/2016   Procedure: LAPAROSCOPIC CHOLECYSTECTOMY WITH INTRAOPERATIVE CHOLANGIOGRAM;  Surgeon: Hubbard Robinson, MD;  Location: [facility];  Service: General;  Laterality: N/A;  . COLONOSCOPY WITH PROPOFOL N/A 12/30/2018   Procedure: COLONOSCOPY WITH PROPOFOL;  Surgeon: Lucilla Lame, MD;  Location: [facility];  Service: Endoscopy;  Laterality: N/A;  . medial branch block  11/06/2015   lumbar facet Dr. Primus Bravo  . POLYPECTOMY N/A 12/30/2018   Procedure: POLYPECTOMY;  Surgeon: Lucilla Lame, MD;  Location: [facility];  Service: Endoscopy;  Laterality: N/A;  Clips placed at Hepatic Flexure Polyp (2) and Transverse Colon Polyp (4) removal sites  . SENTINEL NODE BIOPSY Left 12/18/2014   Procedure: SENTINEL NODE BIOPSY;  Surgeon: Robert Bellow, MD;  Location: [facility];  Service: General;  Laterality: Left;    Family History  Problem Relation Age of Onset  . Breast cancer Sister 45  . Colon cancer Sister   . Colon cancer Mother   . Hypertension Mother   . Asthma Mother   . Cancer Father     Social History:  reports that she quit smoking about 4 years ago. Her smoking use included cigarettes. She smoked 0.50 packs per day. She has never used smokeless tobacco. She reports that she does not drink alcohol or use drugs.  Allergies: No Known  Allergies  Medications reviewed.    ROS Full ROS performed and is otherwise negative other than what is stated in HPI   BP 130/90   Pulse 79   Temp (!) 97.3 F (36.3 C) (Temporal)   Resp 16   Ht 5\' 3"  (1.6 m)   Wt 192 lb (87.1 kg)   SpO2 98%   BMI 34.01 kg/m   Physical Exam Vitals signs and nursing note reviewed. Exam conducted with a chaperone present.  Constitutional:      General: She is not in acute distress.    Appearance: Normal appearance. She is normal weight.  HENT:     Head: Normocephalic and atraumatic.  Eyes:     General: No scleral icterus.       Right eye: No discharge.         Left eye: No discharge.  Neck:     Musculoskeletal: Normal range of motion and neck supple. No neck rigidity or muscular tenderness.  Cardiovascular:     Rate and Rhythm: Normal rate and regular rhythm.     Pulses: Normal pulses.  Pulmonary:     Effort: Pulmonary effort is normal. No respiratory distress.     Breath sounds: Normal breath sounds. No stridor.  Abdominal:     General: Abdomen is flat. There is no distension.     Palpations: There is no mass.     Tenderness: There is abdominal tenderness. There is no guarding or rebound.     Hernia: No hernia is present.  Skin:    General: Skin is warm and dry.     Capillary Refill: Capillary refill takes less than 2 seconds.  Neurological:     General: No focal deficit present.     Mental Status: She is alert and oriented to person, place, and time.  Psychiatric:        Mood and Affect: Mood normal.        Behavior: Behavior normal.        Thought Content: Thought content normal.        Judgment: Judgment normal.       Assessment/Plan: 68 year old female with newly diagnosed splenic flexure adenocarcinoma with high-grade dysplasia on a transverse colon polyp now with post polypectomy syndrome.  I had an extensive discussion with her regarding her situation.  She is slowly recovering from the polypectomy.  I do think that she would require extended Right hemicolectomy.  There is no evidence of metastatic disease.  Procedure discussed with the patient in detail.  Risk, benefits and possible complications including but not limited to: Bleeding, infection, anastomotic leak, chronic pain.  She understands and wishes to proceed.  Greater than 50% of the 40 minutes  visit was spent in counseling/coordination of care   Caroleen Hamman, MD [facility] Surgeon

## 2019-01-04 NOTE — Telephone Encounter (Signed)
Pt came to her appt. Today.

## 2019-01-04 NOTE — Patient Instructions (Signed)
Please pick up your medication at the pharmacy.

## 2019-01-04 NOTE — Progress Notes (Addendum)
Patient's surgery to be scheduled for 01-10-19 at [facility] with Dr. Dahlia Byes. Dr. Rosana Hoes will be assisting with this case.    Patient will require a formal bowel prep. Instructions were provided and reviewed with her today. Medications have been sent in to [facility] on [address].   The patient previously had COVID-19 testing done on 01-02-19 and has isolated except for going to the doctor's office today. We will check with Pre-admit to see if this needs to be repeated.   The patient is aware she will need to Pre-Admit. Patient will check in at the [place] entrance due to COVID-19 restrictions and will then be escorted to the [place], [address] (first floor). Patient will be contacted once Pre-admission appointment has been arranged with date and time.   She is aware to check in at the [place] entrance where she will be screened for the coronavirus and then sent to [facility].   The patient verbalizes understanding of the above.

## 2019-01-04 NOTE — H&P (View-Only) (Signed)
Outpatient Surgical Follow Up  01/04/2019  Carrie Alvarez is an 68 y.o. female.   Chief Complaint  Patient presents with  . Follow-up    Colonoscopy Seen in ED    HPI: 68 year old well-known to me with a recently diagnosed splenic flexure adenocarcinoma.  Pathology came back and I have discussed with the patient in detail.  Additionally she did have a transverse colon polyp consistent with high-grade dysplasia.  She did develop post polypectomy syndrome and is recovering well but is slowly.  She is able to take p.o.  No fevers no chills she does have abdominal pain mainly in the right lower quadrant but not as severe as when I saw her 2 days ago. She Does have chronic pain. The scan personally reviewed there is no evidence of perforation and there is no evidence of metastatic disease.  I discussed both the CT and also the final pathology findings with the patient and the husband in detail  Past Medical History:  Diagnosis Date  . Allergy   . Anemia   . Back pain   . Breast cancer ([place]) 2015   LT LUMPECTOMY 12-2014 FOLLOWING CHEMO  . Carpal tunnel syndrome   . Cataract   . Depression   . GERD (gastroesophageal reflux disease)   . Hypertension   . Obesity   . Personal history of chemotherapy 2016  . Pinched nerve    left side, lumbar area  . Uterine cancer ([place]) 1994    Past Surgical History:  Procedure Laterality Date  . ABDOMINAL HYSTERECTOMY  1994   UTERINE CA  . AXILLARY LYMPH NODE BIOPSY Left 12/18/2014   Procedure: AXILLARY LYMPH NODE BIOPSY/;  Surgeon: Robert Bellow, MD;  Location: [facility];  Service: General;  Laterality: Left;  . AXILLARY LYMPH NODE DISSECTION Left 12/18/2014   Procedure: AXILLARY LYMPH NODE DISSECTION;  Surgeon: Robert Bellow, MD;  Location: [facility];  Service: General;  Laterality: Left;  . BREAST BIOPSY Left 05/2014   CORE BX OF LN, METASTATIC ADENOCARCINOMA  . BREAST LUMPECTOMY Left 12/2014   CHEMO FIRST THEN SURGERY OF LN  . BREAST  SURGERY    . CHOLECYSTECTOMY N/A 04/19/2016   Procedure: LAPAROSCOPIC CHOLECYSTECTOMY WITH INTRAOPERATIVE CHOLANGIOGRAM;  Surgeon: Hubbard Robinson, MD;  Location: [facility];  Service: General;  Laterality: N/A;  . COLONOSCOPY WITH PROPOFOL N/A 12/30/2018   Procedure: COLONOSCOPY WITH PROPOFOL;  Surgeon: Lucilla Lame, MD;  Location: [facility];  Service: Endoscopy;  Laterality: N/A;  . medial branch block  11/06/2015   lumbar facet Dr. Primus Bravo  . POLYPECTOMY N/A 12/30/2018   Procedure: POLYPECTOMY;  Surgeon: Lucilla Lame, MD;  Location: [facility];  Service: Endoscopy;  Laterality: N/A;  Clips placed at Hepatic Flexure Polyp (2) and Transverse Colon Polyp (4) removal sites  . SENTINEL NODE BIOPSY Left 12/18/2014   Procedure: SENTINEL NODE BIOPSY;  Surgeon: Robert Bellow, MD;  Location: [facility];  Service: General;  Laterality: Left;    Family History  Problem Relation Age of Onset  . Breast cancer Sister 52  . Colon cancer Sister   . Colon cancer Mother   . Hypertension Mother   . Asthma Mother   . Cancer Father     Social History:  reports that she quit smoking about 4 years ago. Her smoking use included cigarettes. She smoked 0.50 packs per day. She has never used smokeless tobacco. She reports that she does not drink alcohol or use drugs.  Allergies: No Known  Allergies  Medications reviewed.    ROS Full ROS performed and is otherwise negative other than what is stated in HPI   BP 130/90   Pulse 79   Temp (!) 97.3 F (36.3 C) (Temporal)   Resp 16   Ht 5\' 3"  (1.6 m)   Wt 192 lb (87.1 kg)   SpO2 98%   BMI 34.01 kg/m   Physical Exam Vitals signs and nursing note reviewed. Exam conducted with a chaperone present.  Constitutional:      General: She is not in acute distress.    Appearance: Normal appearance. She is normal weight.  HENT:     Head: Normocephalic and atraumatic.  Eyes:     General: No scleral icterus.       Right eye: No discharge.         Left eye: No discharge.  Neck:     Musculoskeletal: Normal range of motion and neck supple. No neck rigidity or muscular tenderness.  Cardiovascular:     Rate and Rhythm: Normal rate and regular rhythm.     Pulses: Normal pulses.  Pulmonary:     Effort: Pulmonary effort is normal. No respiratory distress.     Breath sounds: Normal breath sounds. No stridor.  Abdominal:     General: Abdomen is flat. There is no distension.     Palpations: There is no mass.     Tenderness: There is abdominal tenderness. There is no guarding or rebound.     Hernia: No hernia is present.  Skin:    General: Skin is warm and dry.     Capillary Refill: Capillary refill takes less than 2 seconds.  Neurological:     General: No focal deficit present.     Mental Status: She is alert and oriented to person, place, and time.  Psychiatric:        Mood and Affect: Mood normal.        Behavior: Behavior normal.        Thought Content: Thought content normal.        Judgment: Judgment normal.       Assessment/Plan: 68 year old female with newly diagnosed splenic flexure adenocarcinoma with high-grade dysplasia on a transverse colon polyp now with post polypectomy syndrome.  I had an extensive discussion with her regarding her situation.  She is slowly recovering from the polypectomy.  I do think that she would require extended Right hemicolectomy.  There is no evidence of metastatic disease.  Procedure discussed with the patient in detail.  Risk, benefits and possible complications including but not limited to: Bleeding, infection, anastomotic leak, chronic pain.  She understands and wishes to proceed.  Greater than 50% of the 40 minutes  visit was spent in counseling/coordination of care   Caroleen Hamman, MD [facility] Surgeon

## 2019-01-04 NOTE — Telephone Encounter (Signed)
Message left for patient to call the office and ask to speak with Caryl-Lyn.   Patient needs to be notified that she needs to Pre-admit on 01-06-19 at 11 am. The patient will require repeat COVID testing per Pre-admit.

## 2019-01-05 NOTE — Telephone Encounter (Signed)
Patient notified of arrival time and date and that she will have to have covid testing.

## 2019-01-06 ENCOUNTER — Other Ambulatory Visit: Payer: Self-pay

## 2019-01-06 ENCOUNTER — Encounter
Admission: RE | Admit: 2019-01-06 | Discharge: 2019-01-06 | Disposition: A | Discharge status: Home / Self Care | Payer: Medicare Other | Source: Ambulatory Visit | Attending: Surgery | Admitting: Surgery

## 2019-01-06 DIAGNOSIS — Z8249 Family history of ischemic heart disease and other diseases of the circulatory system: Secondary | ICD-10-CM | POA: Diagnosis not present

## 2019-01-06 DIAGNOSIS — Z87891 Personal history of nicotine dependence: Secondary | ICD-10-CM | POA: Insufficient documentation

## 2019-01-06 DIAGNOSIS — Z800 Family history of malignant neoplasm of digestive organs: Secondary | ICD-10-CM | POA: Insufficient documentation

## 2019-01-06 DIAGNOSIS — K219 Gastro-esophageal reflux disease without esophagitis: Secondary | ICD-10-CM | POA: Diagnosis not present

## 2019-01-06 DIAGNOSIS — Z853 Personal history of malignant neoplasm of breast: Secondary | ICD-10-CM | POA: Insufficient documentation

## 2019-01-06 DIAGNOSIS — Z1159 Encounter for screening for other viral diseases: Secondary | ICD-10-CM | POA: Insufficient documentation

## 2019-01-06 DIAGNOSIS — Z8542 Personal history of malignant neoplasm of other parts of uterus: Secondary | ICD-10-CM | POA: Insufficient documentation

## 2019-01-06 DIAGNOSIS — I10 Essential (primary) hypertension: Secondary | ICD-10-CM | POA: Diagnosis not present

## 2019-01-06 DIAGNOSIS — Z9221 Personal history of antineoplastic chemotherapy: Secondary | ICD-10-CM | POA: Diagnosis not present

## 2019-01-06 DIAGNOSIS — Z803 Family history of malignant neoplasm of breast: Secondary | ICD-10-CM | POA: Insufficient documentation

## 2019-01-06 DIAGNOSIS — C185 Malignant neoplasm of splenic flexure: Secondary | ICD-10-CM | POA: Insufficient documentation

## 2019-01-06 DIAGNOSIS — Z01812 Encounter for preprocedural laboratory examination: Secondary | ICD-10-CM | POA: Insufficient documentation

## 2019-01-06 HISTORY — DX: Pure hypercholesterolemia, unspecified: E78.00

## 2019-01-06 HISTORY — DX: Low back pain, unspecified: M54.50

## 2019-01-06 LAB — CBC WITH DIFFERENTIAL/PLATELET
Abs Immature Granulocytes: 0.08 10*3/uL — ABNORMAL HIGH (ref 0.00–0.07)
Basophils Absolute: 0 10*3/uL (ref 0.0–0.1)
Basophils Relative: 0 %
Eosinophils Absolute: 0.2 10*3/uL (ref 0.0–0.5)
Eosinophils Relative: 2 %
HCT: 38.3 % (ref 36.0–46.0)
Hemoglobin: 13.3 g/dL (ref 12.0–15.0)
Immature Granulocytes: 1 %
Lymphocytes Relative: 17 %
Lymphs Abs: 1.8 10*3/uL (ref 0.7–4.0)
MCH: 27.2 pg (ref 26.0–34.0)
MCHC: 34.7 g/dL (ref 30.0–36.0)
MCV: 78.3 fL — ABNORMAL LOW (ref 80.0–100.0)
Monocytes Absolute: 0.6 10*3/uL (ref 0.1–1.0)
Monocytes Relative: 5 %
Neutro Abs: 8.3 10*3/uL — ABNORMAL HIGH (ref 1.7–7.7)
Neutrophils Relative %: 75 %
Platelets: 282 10*3/uL (ref 150–400)
RBC: 4.89 MIL/uL (ref 3.87–5.11)
RDW: 13.6 % (ref 11.5–15.5)
WBC: 11 10*3/uL — ABNORMAL HIGH (ref 4.0–10.5)
nRBC: 0 % (ref 0.0–0.2)

## 2019-01-06 LAB — BASIC METABOLIC PANEL
Anion gap: 13 (ref 5–15)
BUN: 16 mg/dL (ref 8–23)
CO2: 22 mmol/L (ref 22–32)
Calcium: 10 mg/dL (ref 8.9–10.3)
Chloride: 105 mmol/L (ref 98–111)
Creatinine, Ser: 1.26 mg/dL — ABNORMAL HIGH (ref 0.44–1.00)
GFR calc Af Amer: 51 mL/min — ABNORMAL LOW (ref >60)
GFR calc non Af Amer: 44 mL/min — ABNORMAL LOW (ref >60)
Glucose, Bld: 95 mg/dL (ref 70–99)
Potassium: 3.2 mmol/L — ABNORMAL LOW (ref 3.5–5.1)
Sodium: 140 mmol/L (ref 135–145)

## 2019-01-06 NOTE — Patient Instructions (Addendum)
INSTRUCTIONS FOR SURGERY     Your surgery is scheduled for:   Tuesday, January 10, 2019     To find out your arrival time for the day of surgery,          please call [phone] between 1 pm and 3 pm on : Monday, January 09, 2019     When you arrive for surgery, report to the [facility].       Do NOT stop on the first floor to register.    REMEMBER: Instructions that are not followed completely may result in serious medical risk,  up to and including death, or upon the discretion of your surgeon and anesthesiologist,            your surgery may need to be rescheduled.  __X__ 1. Do not eat food after midnight the night before your procedure.                    No gum, candy, lozenger, tic tacs, tums or hard candies.                  ABSOLUTELY NOTHING SOLID IN YOUR MOUTH AFTER MIDNIGHT                    You may drink unlimited clear liquids up to 2 hours before you are scheduled to arrive for surgery.                   Do not drink anything within those 2 hours unless you need to take medicine, then take the                   smallest amount you need.  Clear liquids include:  water, apple juice without pulp,                   any flavor Gatorade, Black coffee, black tea.  Sugar may be added but no dairy/ honey /lemon.                        Broth and jello is not considered a clear liquid.  __x__  2. On the morning of surgery, please brush your teeth with toothpaste and water. You may rinse with                  mouthwash if you wish but DO NOT SWALLOW TOOTHPASTE OR MOUTHWASH  __X___3. NO alcohol for 24 hours before or after surgery.  __x___ 4.  Do NOT smoke or use e-cigarettes for 24 HOURS PRIOR TO SURGERY.                      DO NOT Use any chewable tobacco products for at least 6 hours prior to surgery.  __x___ 5. If you start any new medication after this appointment and prior to surgery, please         Bring it with you on the day of surgery.  ___x__ 6. Notify your doctor if there is any change in your medical condition, such as fever, infection, vomitting,  Diarrhea or any open sores.  __x___ 7.  USE the CHG SOAP as instructed, the night before surgery and the day of surgery.                   Once you have washed with this soap, do NOT use any of the following: Powders, perfumes                    or lotions. Please do not wear make up, hairpins, clips or nail polish. You MAY wear deodorant.                   Men may shave their face and neck.  Women need to shave 48 hours prior to surgery.                   DO NOT wear ANY jewelry on the day of surgery. If there are rings that are too tight to                    remove easily, please address this prior to the surgery day. Piercings need to be removed.                                                                     NO METAL ON YOUR BODY.                    Do NOT bring any valuables.  If you came to Pre-Admit testing then you will not need license,                     insurance card or credit card.  If you will be staying overnight, please either leave your things in                     the car or have your family be responsible for these items.                     [facility] IS NOT RESPONSIBLE FOR BELONGINGS OR VALUABLES.  ___X__ 8. DO NOT wear contact lenses on surgery day.  You may not have dentures,                     Hearing aides, contacts or glasses in the operating room. These items can be                    Placed in the Recovery Room to receive immediately after surgery.  __x___ 9. IF YOU ARE SCHEDULED TO GO HOME ON THE SAME DAY, YOU MUST                   Have someone to drive you home and to stay with you  for the first 24 hours.                    Have an arrangement prior to arriving on surgery day.  ___x__ 10. Take the following medications on the morning of surgery with a sip of water:  1.AMLODIPINE                     2.LETROZOLE                     3.OXYCODONE, IF NEEDED                     4.FLONASE, IF NEEDED                     5.                     6.  ___X__ 11.  Follow any instructions provided to you by your surgeon.                        Such as enema, clear liquid bowel prep  __X__  12. STOP COUMADIN / PLAVIX / ELIQUIS / ASPIRIN AS OF: TODAY                       THIS INCLUDES BC POWDERS / GOODIES POWDER  __x___ 13. STOP Anti-inflammatories as of: TODAY                      This includes IBUPROFEN / MOTRIN / ADVIL / ALEVE/ NAPROXYN                    YOU MAY TAKE TYLENOL ANY TIME PRIOR TO SURGERY.  _____ 14.  Stop supplements until after surgery.                     This includes: N/A                 You may continue taking Vitamin B12 / Vitamin D3 but do not take on the morning of surgery.  _____ 15. Bring your CPAP machine into preop with you on the morning of surgery.  ______17.  Continue to take the following medications but do not take on the morning of surgery:                       BACLOFEN / VITAMIN D / VITAMIN B12 AND B6 / GABAPENTIN.                        CONTINUE LIPITOR AND ZOLOFT AT NIGHT  ______18. If staying overnight, please have appropriate shoes to wear to be able to walk around the unit.                   Wear clean and comfortable clothing to the hospital.  IF YOU COMPLETE THE MEDICAL DIRECTIVES, PLEASE BRING WITH YOU TO [facility]   SO WE CAN MAKE A COPY FOR YOUR CHART.

## 2019-01-07 LAB — NOVEL CORONAVIRUS, NAA (HOSP ORDER, SEND-OUT TO REF LAB; TAT 18-24 HRS): SARS-CoV-2, NAA: NOT DETECTED

## 2019-01-07 LAB — CEA: CEA: 2.2 ng/mL (ref 0.0–4.7)

## 2019-01-09 ENCOUNTER — Telehealth: Payer: Self-pay

## 2019-01-09 MED ORDER — POTASSIUM CHLORIDE CRYS ER 20 MEQ PO TBCR: 20.0000 meq | EXTENDED_RELEASE_TABLET | Freq: Two times a day (BID) | ORAL | 0 refills | Status: DC

## 2019-01-09 MED ORDER — SODIUM CHLORIDE 0.9 % IV SOLN
1.0000 g | INTRAVENOUS | Status: AC
  Administered 2019-01-10: 1 g via INTRAVENOUS
  Filled 2019-01-09: qty 1

## 2019-01-09 NOTE — Telephone Encounter (Signed)
Patient notified to pick up potassium and begin today.

## 2019-01-09 NOTE — Telephone Encounter (Signed)
Potassium RX sent to [facility] . Left message for patient to return call to office.

## 2019-01-10 ENCOUNTER — Encounter
Admission: RE | Disposition: A | Discharge status: Home / Self Care | Payer: Self-pay | Source: Home / Self Care | Attending: Surgery

## 2019-01-10 ENCOUNTER — Inpatient Hospital Stay: Payer: Medicare Other | Admitting: Anesthesiology

## 2019-01-10 ENCOUNTER — Inpatient Hospital Stay
Admission: RE | Admit: 2019-01-10 | Discharge: 2019-01-12 | DRG: 330 | Disposition: A | Discharge status: Home / Self Care | Payer: Medicare Other | Attending: Surgery | Admitting: Surgery

## 2019-01-10 ENCOUNTER — Other Ambulatory Visit: Payer: Self-pay

## 2019-01-10 ENCOUNTER — Encounter: Payer: Self-pay | Admitting: *Deleted

## 2019-01-10 DIAGNOSIS — Z90710 Acquired absence of both cervix and uterus: Secondary | ICD-10-CM

## 2019-01-10 DIAGNOSIS — E669 Obesity, unspecified: Secondary | ICD-10-CM | POA: Diagnosis present

## 2019-01-10 DIAGNOSIS — C184 Malignant neoplasm of transverse colon: Secondary | ICD-10-CM

## 2019-01-10 DIAGNOSIS — C772 Secondary and unspecified malignant neoplasm of intra-abdominal lymph nodes: Secondary | ICD-10-CM | POA: Diagnosis present

## 2019-01-10 DIAGNOSIS — K219 Gastro-esophageal reflux disease without esophagitis: Secondary | ICD-10-CM | POA: Diagnosis present

## 2019-01-10 DIAGNOSIS — I10 Essential (primary) hypertension: Secondary | ICD-10-CM | POA: Diagnosis present

## 2019-01-10 DIAGNOSIS — Z6835 Body mass index (BMI) 35.0-35.9, adult: Secondary | ICD-10-CM

## 2019-01-10 DIAGNOSIS — C185 Malignant neoplasm of splenic flexure: Secondary | ICD-10-CM | POA: Diagnosis present

## 2019-01-10 DIAGNOSIS — Z853 Personal history of malignant neoplasm of breast: Secondary | ICD-10-CM

## 2019-01-10 DIAGNOSIS — Z803 Family history of malignant neoplasm of breast: Secondary | ICD-10-CM

## 2019-01-10 DIAGNOSIS — Z825 Family history of asthma and other chronic lower respiratory diseases: Secondary | ICD-10-CM | POA: Diagnosis not present

## 2019-01-10 DIAGNOSIS — C189 Malignant neoplasm of colon, unspecified: Secondary | ICD-10-CM | POA: Diagnosis present

## 2019-01-10 DIAGNOSIS — Z9012 Acquired absence of left breast and nipple: Secondary | ICD-10-CM | POA: Diagnosis not present

## 2019-01-10 DIAGNOSIS — Z8249 Family history of ischemic heart disease and other diseases of the circulatory system: Secondary | ICD-10-CM

## 2019-01-10 DIAGNOSIS — Z87891 Personal history of nicotine dependence: Secondary | ICD-10-CM | POA: Diagnosis not present

## 2019-01-10 DIAGNOSIS — K660 Peritoneal adhesions (postprocedural) (postinfection): Secondary | ICD-10-CM | POA: Diagnosis present

## 2019-01-10 DIAGNOSIS — Z8542 Personal history of malignant neoplasm of other parts of uterus: Secondary | ICD-10-CM

## 2019-01-10 DIAGNOSIS — Z800 Family history of malignant neoplasm of digestive organs: Secondary | ICD-10-CM | POA: Diagnosis not present

## 2019-01-10 HISTORY — PX: COLON RESECTION: SHX5231

## 2019-01-10 LAB — CBC
HCT: 36 % (ref 36.0–46.0)
Hemoglobin: 12.2 g/dL (ref 12.0–15.0)
MCH: 27.5 pg (ref 26.0–34.0)
MCHC: 33.9 g/dL (ref 30.0–36.0)
MCV: 81.3 fL (ref 80.0–100.0)
Platelets: 232 10*3/uL (ref 150–400)
RBC: 4.43 MIL/uL (ref 3.87–5.11)
RDW: 14.6 % (ref 11.5–15.5)
WBC: 15.4 10*3/uL — ABNORMAL HIGH (ref 4.0–10.5)
nRBC: 0 % (ref 0.0–0.2)

## 2019-01-10 LAB — POCT I-STAT 4, (NA,K, GLUC, HGB,HCT)
Glucose, Bld: 102 mg/dL — ABNORMAL HIGH (ref 70–99)
HCT: 38 % (ref 36.0–46.0)
Hemoglobin: 12.9 g/dL (ref 12.0–15.0)
Potassium: 3.1 mmol/L — ABNORMAL LOW (ref 3.5–5.1)
Sodium: 144 mmol/L (ref 135–145)

## 2019-01-10 LAB — CREATININE, SERUM
Creatinine, Ser: 0.81 mg/dL (ref 0.44–1.00)
GFR calc Af Amer: >60 mL/min (ref >60)
GFR calc non Af Amer: >60 mL/min (ref >60)

## 2019-01-10 SURGERY — COLECTOMY, HAND-ASSISTED, LAPAROSCOPIC
Anesthesia: General | Laterality: Right

## 2019-01-10 MED ORDER — FAMOTIDINE 20 MG PO TABS
ORAL_TABLET | ORAL | Status: AC
  Administered 2019-01-10: 20 mg via ORAL
  Filled 2019-01-10: qty 1

## 2019-01-10 MED ORDER — BUPIVACAINE LIPOSOME 1.3 % IJ SUSP: 20.0000 mL | Freq: Once | INTRAMUSCULAR | Status: DC

## 2019-01-10 MED ORDER — MORPHINE SULFATE (PF) 2 MG/ML IV SOLN
2.0000 mg | INTRAVENOUS | Status: DC | PRN
  Administered 2019-01-10 – 2019-01-11 (×4): 2 mg via INTRAVENOUS
  Filled 2019-01-10 (×5): qty 1

## 2019-01-10 MED ORDER — PROPOFOL 10 MG/ML IV BOLUS
INTRAVENOUS | Status: AC
  Filled 2019-01-10: qty 20

## 2019-01-10 MED ORDER — METHOCARBAMOL 500 MG PO TABS
500.0000 mg | ORAL_TABLET | Freq: Four times a day (QID) | ORAL | Status: DC | PRN
  Administered 2019-01-11: 500 mg via ORAL
  Filled 2019-01-10 (×2): qty 1

## 2019-01-10 MED ORDER — HEPARIN SODIUM (PORCINE) 5000 UNIT/ML IJ SOLN
INTRAMUSCULAR | Status: AC
  Administered 2019-01-10: 5000 [IU] via SUBCUTANEOUS
  Filled 2019-01-10: qty 1

## 2019-01-10 MED ORDER — CHLORHEXIDINE GLUCONATE CLOTH 2 % EX PADS: 6.0000 | MEDICATED_PAD | Freq: Once | CUTANEOUS | Status: DC

## 2019-01-10 MED ORDER — GABAPENTIN 300 MG PO CAPS
300.0000 mg | ORAL_CAPSULE | ORAL | Status: AC
  Administered 2019-01-10: 11:00:00 300 mg via ORAL

## 2019-01-10 MED ORDER — GABAPENTIN 400 MG PO CAPS
400.0000 mg | ORAL_CAPSULE | Freq: Three times a day (TID) | ORAL | Status: DC
  Administered 2019-01-10 – 2019-01-12 (×5): 400 mg via ORAL
  Filled 2019-01-10 (×5): qty 1

## 2019-01-10 MED ORDER — BUPIVACAINE-EPINEPHRINE (PF) 0.25% -1:200000 IJ SOLN
INTRAMUSCULAR | Status: DC | PRN
  Administered 2019-01-10: 30 mL

## 2019-01-10 MED ORDER — DEXAMETHASONE SODIUM PHOSPHATE 10 MG/ML IJ SOLN
INTRAMUSCULAR | Status: DC | PRN
  Administered 2019-01-10: 10 mg via INTRAVENOUS

## 2019-01-10 MED ORDER — METOPROLOL TARTRATE 5 MG/5ML IV SOLN: 5.0000 mg | Freq: Four times a day (QID) | INTRAVENOUS | Status: DC | PRN

## 2019-01-10 MED ORDER — HYDROMORPHONE HCL 1 MG/ML IJ SOLN
0.2500 mg | INTRAMUSCULAR | Status: DC | PRN
  Administered 2019-01-10 (×2): 0.25 mg via INTRAVENOUS

## 2019-01-10 MED ORDER — LACTATED RINGERS IV SOLN
INTRAVENOUS | Status: DC
  Administered 2019-01-10 (×2): via INTRAVENOUS

## 2019-01-10 MED ORDER — BUPIVACAINE LIPOSOME 1.3 % IJ SUSP
INTRAMUSCULAR | Status: DC | PRN
  Administered 2019-01-10: 20 mL

## 2019-01-10 MED ORDER — TRAMADOL HCL 50 MG PO TABS: 50.0000 mg | ORAL_TABLET | Freq: Four times a day (QID) | ORAL | Status: DC | PRN

## 2019-01-10 MED ORDER — PROCHLORPERAZINE MALEATE 10 MG PO TABS
10.0000 mg | ORAL_TABLET | Freq: Four times a day (QID) | ORAL | Status: DC | PRN
  Filled 2019-01-10: qty 1

## 2019-01-10 MED ORDER — FAMOTIDINE 20 MG PO TABS
20.0000 mg | ORAL_TABLET | Freq: Once | ORAL | Status: AC
  Administered 2019-01-10: 11:00:00 20 mg via ORAL

## 2019-01-10 MED ORDER — SUGAMMADEX SODIUM 200 MG/2ML IV SOLN
INTRAVENOUS | Status: DC | PRN
  Administered 2019-01-10: 200 mg via INTRAVENOUS

## 2019-01-10 MED ORDER — BACLOFEN 10 MG PO TABS
10.0000 mg | ORAL_TABLET | Freq: Three times a day (TID) | ORAL | Status: DC | PRN
  Filled 2019-01-10: qty 1

## 2019-01-10 MED ORDER — FENTANYL CITRATE (PF) 250 MCG/5ML IJ SOLN
INTRAMUSCULAR | Status: AC
  Filled 2019-01-10: qty 5

## 2019-01-10 MED ORDER — FENTANYL CITRATE (PF) 100 MCG/2ML IJ SOLN
INTRAMUSCULAR | Status: DC | PRN
  Administered 2019-01-10: 50 ug via INTRAVENOUS
  Administered 2019-01-10: 100 ug via INTRAVENOUS
  Administered 2019-01-10 (×2): 50 ug via INTRAVENOUS

## 2019-01-10 MED ORDER — ACETAMINOPHEN 500 MG PO TABS
ORAL_TABLET | ORAL | Status: AC
  Administered 2019-01-10: 1000 mg via ORAL
  Filled 2019-01-10: qty 2

## 2019-01-10 MED ORDER — ONDANSETRON 4 MG PO TBDP
4.0000 mg | ORAL_TABLET | Freq: Four times a day (QID) | ORAL | Status: DC | PRN
  Filled 2019-01-10: qty 1

## 2019-01-10 MED ORDER — SODIUM CHLORIDE 0.9 % IV SOLN
INTRAVENOUS | Status: DC
  Administered 2019-01-10 – 2019-01-12 (×3): via INTRAVENOUS

## 2019-01-10 MED ORDER — ONDANSETRON HCL 4 MG/2ML IJ SOLN
4.0000 mg | Freq: Four times a day (QID) | INTRAMUSCULAR | Status: DC | PRN
  Administered 2019-01-10: 4 mg via INTRAVENOUS
  Filled 2019-01-10: qty 2

## 2019-01-10 MED ORDER — FENTANYL CITRATE (PF) 100 MCG/2ML IJ SOLN
INTRAMUSCULAR | Status: AC
  Administered 2019-01-10: 25 ug via INTRAVENOUS
  Filled 2019-01-10: qty 2

## 2019-01-10 MED ORDER — FENTANYL CITRATE (PF) 100 MCG/2ML IJ SOLN
25.0000 ug | INTRAMUSCULAR | Status: DC | PRN
  Administered 2019-01-10: 50 ug via INTRAVENOUS
  Administered 2019-01-10 (×2): 25 ug via INTRAVENOUS

## 2019-01-10 MED ORDER — ENOXAPARIN SODIUM 40 MG/0.4ML ~~LOC~~ SOLN
40.0000 mg | SUBCUTANEOUS | Status: DC
  Administered 2019-01-11 – 2019-01-12 (×2): 40 mg via SUBCUTANEOUS
  Filled 2019-01-10 (×2): qty 0.4

## 2019-01-10 MED ORDER — OXYCODONE HCL 5 MG PO TABS
5.0000 mg | ORAL_TABLET | ORAL | Status: DC | PRN
  Administered 2019-01-10 – 2019-01-12 (×5): 10 mg via ORAL
  Filled 2019-01-10 (×5): qty 2

## 2019-01-10 MED ORDER — CELECOXIB 200 MG PO CAPS
200.0000 mg | ORAL_CAPSULE | ORAL | Status: AC
  Administered 2019-01-10: 11:00:00 200 mg via ORAL

## 2019-01-10 MED ORDER — BUPIVACAINE LIPOSOME 1.3 % IJ SUSP
INTRAMUSCULAR | Status: AC
  Filled 2019-01-10: qty 20

## 2019-01-10 MED ORDER — KETOROLAC TROMETHAMINE 15 MG/ML IJ SOLN
15.0000 mg | Freq: Four times a day (QID) | INTRAMUSCULAR | Status: DC
  Administered 2019-01-10 – 2019-01-12 (×7): 15 mg via INTRAVENOUS
  Filled 2019-01-10 (×7): qty 1

## 2019-01-10 MED ORDER — SEVOFLURANE IN SOLN
RESPIRATORY_TRACT | Status: AC
  Filled 2019-01-10: qty 250

## 2019-01-10 MED ORDER — BUPIVACAINE-EPINEPHRINE (PF) 0.25% -1:200000 IJ SOLN
INTRAMUSCULAR | Status: AC
  Filled 2019-01-10: qty 30

## 2019-01-10 MED ORDER — PROMETHAZINE HCL 25 MG/ML IJ SOLN
6.2500 mg | Freq: Four times a day (QID) | INTRAMUSCULAR | Status: DC | PRN
  Administered 2019-01-10: 6.25 mg via INTRAVENOUS

## 2019-01-10 MED ORDER — FLUTICASONE PROPIONATE 50 MCG/ACT NA SUSP
2.0000 | Freq: Every day | NASAL | Status: DC | PRN
  Filled 2019-01-10: qty 16

## 2019-01-10 MED ORDER — PANTOPRAZOLE SODIUM 40 MG IV SOLR
40.0000 mg | Freq: Every day | INTRAVENOUS | Status: DC
  Administered 2019-01-10 – 2019-01-11 (×2): 40 mg via INTRAVENOUS
  Filled 2019-01-10 (×2): qty 40

## 2019-01-10 MED ORDER — MIDAZOLAM HCL 2 MG/2ML IJ SOLN
INTRAMUSCULAR | Status: AC
  Filled 2019-01-10: qty 2

## 2019-01-10 MED ORDER — ALBUTEROL SULFATE (2.5 MG/3ML) 0.083% IN NEBU: 2.5000 mg | INHALATION_SOLUTION | Freq: Four times a day (QID) | RESPIRATORY_TRACT | Status: DC | PRN

## 2019-01-10 MED ORDER — CELECOXIB 200 MG PO CAPS
ORAL_CAPSULE | ORAL | Status: AC
  Administered 2019-01-10: 200 mg via ORAL
  Filled 2019-01-10: qty 1

## 2019-01-10 MED ORDER — OXYCODONE HCL 5 MG PO TABS: 5.0000 mg | ORAL_TABLET | Freq: Once | ORAL | Status: DC | PRN

## 2019-01-10 MED ORDER — PROMETHAZINE HCL 25 MG/ML IJ SOLN
INTRAMUSCULAR | Status: AC
  Administered 2019-01-10: 17:00:00
  Filled 2019-01-10: qty 1

## 2019-01-10 MED ORDER — LIDOCAINE HCL (CARDIAC) PF 100 MG/5ML IV SOSY
PREFILLED_SYRINGE | INTRAVENOUS | Status: DC | PRN
  Administered 2019-01-10: 100 mg via INTRAVENOUS

## 2019-01-10 MED ORDER — HEPARIN SODIUM (PORCINE) 5000 UNIT/ML IJ SOLN
5000.0000 [IU] | Freq: Once | INTRAMUSCULAR | Status: AC
  Administered 2019-01-10: 11:00:00 5000 [IU] via SUBCUTANEOUS

## 2019-01-10 MED ORDER — SODIUM CHLORIDE (PF) 0.9 % IJ SOLN
INTRAMUSCULAR | Status: DC | PRN
  Administered 2019-01-10: 50 mL

## 2019-01-10 MED ORDER — ACETAMINOPHEN 500 MG PO TABS
1000.0000 mg | ORAL_TABLET | Freq: Four times a day (QID) | ORAL | Status: DC
  Administered 2019-01-11 – 2019-01-12 (×6): 1000 mg via ORAL
  Filled 2019-01-10 (×7): qty 2

## 2019-01-10 MED ORDER — MIDAZOLAM HCL 5 MG/5ML IJ SOLN
INTRAMUSCULAR | Status: AC
  Filled 2019-01-10: qty 5

## 2019-01-10 MED ORDER — ONDANSETRON HCL 4 MG/2ML IJ SOLN
INTRAMUSCULAR | Status: DC | PRN
  Administered 2019-01-10: 4 mg via INTRAVENOUS

## 2019-01-10 MED ORDER — PROPOFOL 10 MG/ML IV BOLUS
INTRAVENOUS | Status: DC | PRN
  Administered 2019-01-10: 120 mg via INTRAVENOUS

## 2019-01-10 MED ORDER — HYDROMORPHONE HCL 1 MG/ML IJ SOLN
INTRAMUSCULAR | Status: AC
  Administered 2019-01-10: 0.25 mg via INTRAVENOUS
  Filled 2019-01-10: qty 1

## 2019-01-10 MED ORDER — OXYCODONE HCL 5 MG/5ML PO SOLN: 5.0000 mg | Freq: Once | ORAL | Status: DC | PRN

## 2019-01-10 MED ORDER — SODIUM CHLORIDE (PF) 0.9 % IJ SOLN
INTRAMUSCULAR | Status: AC
  Filled 2019-01-10: qty 50

## 2019-01-10 MED ORDER — ROCURONIUM BROMIDE 100 MG/10ML IV SOLN
INTRAVENOUS | Status: DC | PRN
  Administered 2019-01-10: 20 mg via INTRAVENOUS
  Administered 2019-01-10: 50 mg via INTRAVENOUS
  Administered 2019-01-10: 20 mg via INTRAVENOUS

## 2019-01-10 MED ORDER — MIDAZOLAM HCL 2 MG/2ML IJ SOLN
INTRAMUSCULAR | Status: DC | PRN
  Administered 2019-01-10: 2 mg via INTRAVENOUS

## 2019-01-10 MED ORDER — PROCHLORPERAZINE EDISYLATE 10 MG/2ML IJ SOLN
5.0000 mg | Freq: Four times a day (QID) | INTRAMUSCULAR | Status: DC | PRN
  Filled 2019-01-10: qty 2

## 2019-01-10 MED ORDER — ACETAMINOPHEN 500 MG PO TABS
1000.0000 mg | ORAL_TABLET | ORAL | Status: AC
  Administered 2019-01-10: 11:00:00 1000 mg via ORAL

## 2019-01-10 MED ORDER — PHENYLEPHRINE HCL (PRESSORS) 10 MG/ML IV SOLN
INTRAVENOUS | Status: DC | PRN
  Administered 2019-01-10: 100 ug via INTRAVENOUS
  Administered 2019-01-10 (×2): 200 ug via INTRAVENOUS
  Administered 2019-01-10: 100 ug via INTRAVENOUS

## 2019-01-10 MED ORDER — SODIUM CHLORIDE FLUSH 0.9 % IV SOLN
INTRAVENOUS | Status: AC
  Filled 2019-01-10: qty 10

## 2019-01-10 MED ORDER — GABAPENTIN 300 MG PO CAPS
ORAL_CAPSULE | ORAL | Status: AC
  Administered 2019-01-10: 300 mg via ORAL
  Filled 2019-01-10: qty 1

## 2019-01-10 SURGICAL SUPPLY — 81 items
"PENCIL ELECTRO HAND CTR " (MISCELLANEOUS) ×1 IMPLANT
APPLIER CLIP 13 LRG OPEN (CLIP)
BAG DECANTER FOR FLEXI CONT (MISCELLANEOUS) ×1 IMPLANT
BLADE CLIPPER SURG (BLADE) ×1 IMPLANT
BLADE SURG 15 STRL LF DISP TIS (BLADE) ×1 IMPLANT
BLADE SURG 15 STRL SS (BLADE) ×1
BLADE SURG SZ10 CARB STEEL (BLADE) ×2 IMPLANT
BLADE SURG SZ11 CARB STEEL (BLADE) ×2 IMPLANT
BULB RESERV EVAC DRAIN JP 100C (MISCELLANEOUS) ×1 IMPLANT
CANISTER SUCT 1200ML W/VALVE (MISCELLANEOUS) ×2 IMPLANT
CATH ROBINSON RED 14FR (CATHETERS) ×1 IMPLANT
CATH URET ROBINSON RED 14FR (CATHETERS)
CHLORAPREP W/TINT 26 (MISCELLANEOUS) ×3 IMPLANT
CLIP APPLIE 13 LRG OPEN (CLIP) ×1 IMPLANT
COVER WAND RF STERILE (DRAPES) ×2 IMPLANT
DEFOGGER SCOPE WARMER CLEARIFY (MISCELLANEOUS) ×2 IMPLANT
DERMABOND ADVANCED (GAUZE/BANDAGES/DRESSINGS)
DERMABOND ADVANCED .7 DNX12 (GAUZE/BANDAGES/DRESSINGS) ×2 IMPLANT
DRAPE INCISE IOBAN 66X45 STRL (DRAPES) ×2 IMPLANT
DRAPE LEGGINS SURG 28X43 STRL (DRAPES) ×1 IMPLANT
DRAPE UNDER BUTTOCK W/FLU (DRAPES) ×1 IMPLANT
DRSG OPSITE POSTOP 3X4 (GAUZE/BANDAGES/DRESSINGS) IMPLANT
DRSG OPSITE POSTOP 4X6 (GAUZE/BANDAGES/DRESSINGS) ×1 IMPLANT
DRSG TEGADERM 2-3/8X2-3/4 SM (GAUZE/BANDAGES/DRESSINGS) ×2 IMPLANT
ELECT BLADE 6.5 EXT (BLADE) ×2 IMPLANT
ELECT REM PT RETURN 9FT ADLT (ELECTROSURGICAL) ×2
ELECTRODE REM PT RTRN 9FT ADLT (ELECTROSURGICAL) ×1 IMPLANT
GAUZE SPONGE 4X4 12PLY STRL (GAUZE/BANDAGES/DRESSINGS) ×1 IMPLANT
GLOVE BIO SURGEON STRL SZ7 (GLOVE) ×6 IMPLANT
GOWN STRL REUS W/TWL LRG LVL4 (GOWN DISPOSABLE) ×12 IMPLANT
HANDLE SUCTION POOLE (INSTRUMENTS) ×1 IMPLANT
HANDLE YANKAUER SUCT BULB TIP (MISCELLANEOUS) ×3 IMPLANT
IRRIGATION STRYKERFLOW (MISCELLANEOUS) ×1 IMPLANT
IRRIGATOR STRYKERFLOW (MISCELLANEOUS)
IV NS 1000ML (IV SOLUTION) ×1
IV NS 1000ML BAXH (IV SOLUTION) ×1 IMPLANT
JACKSON PRATT 10 (INSTRUMENTS) ×1 IMPLANT
L-HOOK LAP DISP 36CM (ELECTROSURGICAL)
LHOOK LAP DISP 36CM (ELECTROSURGICAL) ×1 IMPLANT
MARKER SKIN DUAL TIP RULER LAB (MISCELLANEOUS) ×1 IMPLANT
NEEDLE HYPO 22GX1.5 SAFETY (NEEDLE) ×2 IMPLANT
NS IRRIG 1000ML POUR BTL (IV SOLUTION) ×2 IMPLANT
PACK COLON CLEAN CLOSURE (MISCELLANEOUS) ×2 IMPLANT
PACK LAP CHOLECYSTECTOMY (MISCELLANEOUS) ×2 IMPLANT
PENCIL ELECTRO HAND CTR (MISCELLANEOUS) ×3 IMPLANT
RELOAD PROXIMATE 75MM BLUE (ENDOMECHANICALS) ×8 IMPLANT
RELOAD STAPLE 60 3.6 BLU REG (STAPLE) ×1 IMPLANT
RELOAD STAPLE 75 3.8 BLU REG (ENDOMECHANICALS) IMPLANT
RELOAD STAPLER BLUE 60MM (STAPLE) ×1 IMPLANT
SCISSORS METZENBAUM CVD 33 (INSTRUMENTS) ×1 IMPLANT
SET TUBE SMOKE EVAC HIGH FLOW (TUBING) ×2 IMPLANT
SHEARS HARMONIC ACE PLUS 36CM (ENDOMECHANICALS) ×2 IMPLANT
SLEEVE ENDOPATH XCEL 5M (ENDOMECHANICALS) ×2 IMPLANT
SPONGE LAP 18X18 RF (DISPOSABLE) ×3 IMPLANT
STAPLE ECHEON FLEX 60 POW ENDO (STAPLE) IMPLANT
STAPLER CIRCULAR 29MM (STAPLE) IMPLANT
STAPLER ENDO ILS CVD 18 33 (STAPLE) IMPLANT
STAPLER PROX 25M (MISCELLANEOUS) IMPLANT
STAPLER RELOAD BLUE 60MM (STAPLE) ×2
STAPLER SKIN PROX 35W (STAPLE) ×2 IMPLANT
SUCT SIGMOIDOSCOPE TIP 18 W/TU (SUCTIONS) ×1 IMPLANT
SUCTION POOLE HANDLE (INSTRUMENTS)
SUT MNCRL AB 4-0 PS2 18 (SUTURE) ×3 IMPLANT
SUT PDS AB 0 CT1 27 (SUTURE) ×4 IMPLANT
SUT SILK 2 0 (SUTURE) ×1
SUT SILK 2 0SH CR/8 30 (SUTURE) ×3 IMPLANT
SUT SILK 2-0 (SUTURE) ×2 IMPLANT
SUT SILK 2-0 18XBRD TIE 12 (SUTURE) ×1 IMPLANT
SUT SILK 3-0 (SUTURE) ×2 IMPLANT
SUT VIC AB 2-0 SH 27 (SUTURE)
SUT VIC AB 2-0 SH 27XBRD (SUTURE) ×2 IMPLANT
SYR 20CC LL (SYRINGE) ×4 IMPLANT
SYR 50ML LL SCALE MARK (SYRINGE) ×2 IMPLANT
SYRINGE IRR TOOMEY STRL 70CC (SYRINGE) ×1 IMPLANT
SYS LAPSCP GELPORT 120MM (MISCELLANEOUS) ×2
SYSTEM LAPSCP GELPORT 120MM (MISCELLANEOUS) ×1 IMPLANT
TOWEL OR 17X26 4PK STRL BLUE (TOWEL DISPOSABLE) ×3 IMPLANT
TRAY FOLEY MTR SLVR 16FR STAT (SET/KITS/TRAYS/PACK) ×2 IMPLANT
TROCAR XCEL 12X100 BLDLESS (ENDOMECHANICALS) ×2 IMPLANT
TROCAR XCEL NON-BLD 5MMX100MML (ENDOMECHANICALS) ×2 IMPLANT
TUBING CONNECTING 10 (TUBING) ×1 IMPLANT

## 2019-01-10 NOTE — Op Note (Signed)
PROCEDURES: 1. Hand assisted Laparoscopic Extended Right Hemicolectomy including splenic flexure With stapled ileocolostomy 2. Laparoscopic small bowel resection 3. Laparoscopic Lysis of adhesions 4. laparoscopic takedown of splenic flexure   Pre-operative Diagnosis: Colon CA splenic flexure with High grade dysplasia on Mid transverse colon polyp  Post-operative Diagnosis: Same  Surgeon: Marjory Lies Leshaun Biebel   Assistants: Dr. Leilani Able Required due to the complexity of the case: for exposure and creation of the anastomosis  Anesthesia: General endotracheal anesthesia  ASA Class: 2  Surgeon: Caroleen Hamman , MD FACS  Anesthesia: Gen. with endotracheal tube   Findings: No evidence of distant metastasis Tension free anastomosis, no evidence of intraop leak and good perfusion Extensive adhesions requiring an additional small bowel resection due to kinks and potential for suboptimal anastomosis  Estimated Blood Loss: 50-cc         Drains: none         Specimens: Right, transverse  Colon and small bowel          Complications: none          Procedure Details  The patient was seen again in the Holding Room. The benefits, complications, treatment options, and expected outcomes were discussed with the patient. The risks of bleeding, infection, recurrence of symptoms, failure to resolve symptoms,  bowel injury, any of which could require further surgery were reviewed with the patient.   The patient was taken to Operating Room, identified as Carrie Alvarez and the procedure verified.  A Time Out was held and the above information confirmed.  Prior to the induction of general anesthesia, antibiotic prophylaxis was administered. VTE prophylaxis was in place. General endotracheal anesthesia was then administered and tolerated well. After the induction, the abdomen was prepped with Chloraprep and draped in the sterile fashion. The patient was positioned in the supine position.  7 cm incision was created  as a midline mini laparotomy. The abdominal cavity was entered under direct visualization and the GelPort device was placed. two 5 mm ports were placed  under direct visualization and pneumoperitoneum was obtained.  The more dynamic changes were observed. The greater omentum was divided and the hepatic flexure was taken down using harmonic scalpel.  The white line of Toldt was incised and a lateral to medial dissection was performed.  We identified the right ureter as well as the duodenum and preserve both structures at all times. We Were also able to mobilize the attachments of the cecum and terminal ileum.   Attention was turned to the splenic flexure where this was taken down with harmonic scalpel. We were able to obtain a very good mobilization of the colon   Once we had an adequate mobilization were able to remove the GelPort and exteriorized the right colon.  A 10 cm margin on the terminal ileum was identified and we created a window with electrocautery and divided the terminal ileum.  Attention then was turned to the distal excision margin.  We identified the middle colic artery on selected a spot right to the middle colic artery. We  Were able to also use a 75 GIA stapler to divide this area.  The mesentery was scored with electrocautery.  We identified the right colic artery and suture ligated with 2 o silks in the standard fashion.  The rest of the mesentery was divided using the harmonic scalpel.  Please note that we went as low as possible to the base of the mesentery to obtain adequate lymph nodes and adequate margins of dissection.  Specimen was passed and sent to permanent pathology.   We did have a stump of small bowel that was densely adhere to the pelvis, extensive LOA performed, Once we completed the enterolysis we realized that this segment had a lot of kinks and adhesions and it was suboptimal for an anastomosis, therefore we decided to resect about 20 cms of terminal ileum using  standard GIA stapler.   A standard side-to-side functional end to end staple anastomosis was created with multiple loads of a 75 GIA stapler device.  We check for patency as well as leak.  There was a tension-free anastomosis with good perfusion and no evidence of intraoperative leak.  The mesenteric defect was not closed . We changed gloves and place a clean closure tray.   liposomal Marcaine was injected throughout the abdominal wall on both sides under direct visualization and palpation.  The fascia was closed with a running 0 PDS using the small bite techniques.  Incisions were closed with staples.  Needle and laparotomy counts were correct and there were no immediate complications     Caroleen Hamman, MD, FACS

## 2019-01-10 NOTE — Interval H&P Note (Signed)
History and Physical Interval Note:  01/10/2019 12:16 PM  Carrie Alvarez  has presented today for surgery, with the diagnosis of COLON CANCER.  The various methods of treatment have been discussed with the patient and family. After consideration of risks, benefits and other options for treatment, the patient has consented to  Procedure(s): HAND ASSISTED LAPAROSCOPIC RIGHT COLON RESECTION (Right) as a surgical intervention.  The patient's history has been reviewed, patient examined, no change in status, stable for surgery.  I have reviewed the patient's chart and labs.  Questions were answered to the patient's satisfaction.     [place]

## 2019-01-10 NOTE — Anesthesia Preprocedure Evaluation (Signed)
Anesthesia Evaluation  Patient identified by MRN, date of birth, ID band Patient awake    Reviewed: Allergy & Precautions, H&P , NPO status , Patient's Chart, lab work & pertinent test results  History of Anesthesia Complications Negative for: history of anesthetic complications  Airway Mallampati: III  TM Distance: >3 FB Neck ROM: full    Dental  (+) Chipped, Poor Dentition   Pulmonary neg shortness of breath, former smoker,           Cardiovascular Exercise Tolerance: Good hypertension, (-) angina(-) Past MI and (-) DOE      Neuro/Psych PSYCHIATRIC DISORDERS  Neuromuscular disease negative psych ROS   GI/Hepatic Neg liver ROS, GERD  Medicated and Controlled,  Endo/Other  negative endocrine ROS  Renal/GU      Musculoskeletal   Abdominal   Peds  Hematology negative hematology ROS (+)   Anesthesia Other Findings Past Medical History: No date: Allergy No date: Anemia No date: Back pain 2015: Breast cancer ([place])     Comment:  LT LUMPECTOMY 12-2014 FOLLOWING CHEMO No date: Carpal tunnel syndrome     Comment:  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 No date: Cataract 2020: Colon cancer ([place]) No date: Depression No date: GERD (gastroesophageal reflux disease)     Comment:  problem with reflux during chemo No date: Hypercholesteremia No date: Hypertension No date: Lumbosacral pain     Comment:  sees pain management in [place] No date: Obesity 2016: Personal history of chemotherapy No date: Pinched nerve     Comment:  left side, lumbar area 1994: Uterine cancer ([place])  Past Surgical History: 1994: ABDOMINAL HYSTERECTOMY     Comment:  UTERINE CA 12/18/2014: AXILLARY LYMPH NODE BIOPSY; Left     Comment:  Procedure: AXILLARY LYMPH NODE BIOPSY/;  Surgeon:               Robert Bellow, MD;  Location: [facility] ORS;  Service:               General;  Laterality: Left; 12/18/2014: AXILLARY LYMPH NODE DISSECTION; Left    Comment:  Procedure: AXILLARY LYMPH NODE DISSECTION;  Surgeon:               Robert Bellow, MD;  Location: [facility] ORS;  Service:               General;  Laterality: Left; 05/2014: BREAST BIOPSY; Left     Comment:  CORE BX OF LN, METASTATIC ADENOCARCINOMA 12/2014: BREAST LUMPECTOMY; Left     Comment:  CHEMO FIRST THEN SURGERY OF LN No date: BREAST SURGERY     Comment:  lymph node removal 04/19/2016: CHOLECYSTECTOMY; N/A     Comment:  Procedure: LAPAROSCOPIC CHOLECYSTECTOMY WITH               INTRAOPERATIVE CHOLANGIOGRAM;  Surgeon: Hubbard Robinson, MD;  Location: [facility] ORS;  Service: General;                Laterality: N/A; 12/30/2018: COLONOSCOPY WITH PROPOFOL; N/A     Comment:  Procedure: COLONOSCOPY WITH PROPOFOL;  Surgeon: Lucilla Lame, MD;  Location: [facility];  Service:               Endoscopy;  Laterality: N/A; 11/06/2015: medial branch block     Comment:  lumbar facet Dr. Primus Bravo  12/30/2018: POLYPECTOMY; N/A     Comment:  Procedure: POLYPECTOMY;  Surgeon: Lucilla Lame, MD;                Location: [facility];  Service: Endoscopy;                Laterality: N/A;  Clips placed at Hepatic Flexure Polyp               (2) and Transverse Colon Polyp (4) removal sites 12/18/2014: SENTINEL NODE BIOPSY; Left     Comment:  Procedure: SENTINEL NODE BIOPSY;  Surgeon: Robert Bellow, MD;  Location: [facility] OR[facility];  Service: General;                Laterality: Left;  BMI    Body Mass Index: 35.15 kg/m      Reproductive/Obstetrics negative OB ROS                             Anesthesia Physical Anesthesia Plan  ASA: III  Anesthesia Plan: General ETT   Post-op Pain Management:    Induction: Intravenous  PONV Risk Score and Plan: Ondansetron, Dexamethasone, Midazolam and Treatment may vary due to age or medical condition  Airway Management Planned: Oral ETT  Additional Equipment:   Intra-op  Plan:   Post-operative Plan: Extubation in OR  Informed Consent: I have reviewed the patients History and Physical, chart, labs and discussed the procedure including the risks, benefits and alternatives for the proposed anesthesia with the patient or authorized representative who has indicated his/her understanding and acceptance.     Dental Advisory Given  Plan Discussed with: Anesthesiologist, CRNA and Surgeon  Anesthesia Plan Comments: (Patient consented for risks of anesthesia including but not limited to:  - adverse reactions to medications - damage to teeth, lips or other oral mucosa - sore throat or hoarseness - Damage to heart, brain, lungs or loss of life  Patient voiced understanding.)        Anesthesia Quick Evaluation

## 2019-01-10 NOTE — Transfer of Care (Signed)
Immediate Anesthesia Transfer of Care Note  Patient: Carrie Alvarez  Procedure(s) Performed: HAND ASSISTED LAPAROSCOPIC RIGHT COLON RESECTION (Right )  Patient Location: PACU  Anesthesia Type:General  Level of Consciousness: awake, alert  and oriented  Airway & Oxygen Therapy: Patient Spontanous Breathing and Patient connected to face mask oxygen  Post-op Assessment: Report given to RN and Post -op Vital signs reviewed and stable  Post vital signs: Reviewed and stable  Last Vitals:  Vitals Value Taken Time  BP 135/97 01/10/19 1634  Temp 36.9 C 01/10/19 1634  Pulse 84 01/10/19 1637  Resp 23 01/10/19 1637  SpO2 100 % 01/10/19 1637  Vitals shown include unvalidated device data.  Last Pain:  Vitals:   01/10/19 1040  TempSrc: Temporal  PainSc: 7          Complications: No apparent anesthesia complications

## 2019-01-10 NOTE — Anesthesia Post-op Follow-up Note (Signed)
Anesthesia QCDR form completed.        

## 2019-01-10 NOTE — Anesthesia Procedure Notes (Signed)
Procedure Name: Intubation Date/Time: 01/10/2019 1:03 PM Performed by: Philbert Riser, CRNA Pre-anesthesia Checklist: Patient identified, Emergency Drugs available, Suction available and Patient being monitored Patient Re-evaluated:Patient Re-evaluated prior to induction Oxygen Delivery Method: Circle system utilized and Simple face mask Preoxygenation: Pre-oxygenation with 100% oxygen Induction Type: IV induction Ventilation: Mask ventilation without difficulty Laryngoscope Size: McGraph and 3 Grade View: Grade II Tube type: Oral Tube size: 7.0 mm Number of attempts: 2 Airway Equipment and Method: Stylet Placement Confirmation: ETT inserted through vocal cords under direct vision,  positive ETCO2,  CO2 detector and breath sounds checked- equal and bilateral Secured at: 21 cm Tube secured with: Tape Dental Injury: Teeth and Oropharynx as per pre-operative assessment

## 2019-01-11 LAB — BASIC METABOLIC PANEL
Anion gap: 10 (ref 5–15)
BUN: 9 mg/dL (ref 8–23)
CO2: 23 mmol/L (ref 22–32)
Calcium: 9 mg/dL (ref 8.9–10.3)
Chloride: 110 mmol/L (ref 98–111)
Creatinine, Ser: 0.92 mg/dL (ref 0.44–1.00)
GFR calc Af Amer: >60 mL/min (ref >60)
GFR calc non Af Amer: >60 mL/min (ref >60)
Glucose, Bld: 126 mg/dL — ABNORMAL HIGH (ref 70–99)
Potassium: 3.5 mmol/L (ref 3.5–5.1)
Sodium: 143 mmol/L (ref 135–145)

## 2019-01-11 LAB — CBC
HCT: 32.1 % — ABNORMAL LOW (ref 36.0–46.0)
Hemoglobin: 11.2 g/dL — ABNORMAL LOW (ref 12.0–15.0)
MCH: 27.6 pg (ref 26.0–34.0)
MCHC: 34.9 g/dL (ref 30.0–36.0)
MCV: 79.1 fL — ABNORMAL LOW (ref 80.0–100.0)
Platelets: 230 10*3/uL (ref 150–400)
RBC: 4.06 MIL/uL (ref 3.87–5.11)
RDW: 14 % (ref 11.5–15.5)
WBC: 15.4 10*3/uL — ABNORMAL HIGH (ref 4.0–10.5)
nRBC: 0 % (ref 0.0–0.2)

## 2019-01-11 LAB — PHOSPHORUS: Phosphorus: 2.1 mg/dL — ABNORMAL LOW (ref 2.5–4.6)

## 2019-01-11 LAB — MAGNESIUM: Magnesium: 1.8 mg/dL (ref 1.7–2.4)

## 2019-01-11 MED ORDER — POTASSIUM PHOSPHATES 15 MMOLE/5ML IV SOLN
10.0000 mmol | Freq: Once | INTRAVENOUS | Status: AC
  Administered 2019-01-11: 10 mmol via INTRAVENOUS
  Filled 2019-01-11: qty 3.33

## 2019-01-11 MED ORDER — BOOST / RESOURCE BREEZE PO LIQD CUSTOM
1.0000 | Freq: Three times a day (TID) | ORAL | Status: DC
  Administered 2019-01-11 (×3): 1 via ORAL

## 2019-01-11 NOTE — Progress Notes (Signed)
Foley removed at 9 am. Due to void at [facility] today.

## 2019-01-11 NOTE — Progress Notes (Signed)
[facility] Day(s): 1.   Post op day(s): 1 Day Post-Op.   Interval History: Patient seen and examined, no acute events or new complaints overnight. Patient reports abdominal soreness diffusely throughout her abdomen. No reports of fever, chills, nausea, or emesis. She has not had anything besides water since surgery, but she is on clear liquids. No flatus. Mildly tachycardic to 104 this morning with stable leukocytosis compared to pre-op labs. Has not mobilized from bed but plans to. No other issues.    Vital signs in last 24 hours: [min-max] current  Temp:  [97.3 F (36.3 C)-98.8 F (37.1 C)] 98.8 F (37.1 C) (07/01 0427) Pulse Rate:  [72-104] 104 (07/01 0427) Resp:  [10-22] 12 (07/01 0427) BP: (117-156)/(64-99) 128/98 (07/01 0427) SpO2:  [95 %-100 %] 99 % (07/01 0427) Weight:  [90 kg] 90 kg (06/30 1040)     Height: 5\' 3"  (160 cm) Weight: 90 kg BMI (Calculated): 35.16   Intake/Output last 2 shifts:  06/30 0701 - 07/01 0700 In: 3097.2 [I.V.:2997.2; IV Piggyback:100] Out: 161 [Urine:795; Blood:75]   Physical Exam:  Constitutional: alert, cooperative and no distress  Respiratory: breathing non-labored at rest  Gastrointestinal: soft, expected abdominal tenderness worse over midline incision, and non-distended. No rebound/guarding Genitourinary: Foley Catheter in place Integumentary: Midline laparotomy incision is intact with honeycomb dressing, drainage on middle portion, no erythema. Laparoscopic incisions are CDI without erythema or drainage   Labs:  CBC Latest Ref Rng & Units 01/11/2019 01/10/2019 01/10/2019  WBC 4.0 - 10.5 K/uL 15.4(H) 15.4(H) -  Hemoglobin 12.0 - 15.0 g/dL 11.2(L) 12.2 12.9  Hematocrit 36.0 - 46.0 % 32.1(L) 36.0 38.0  Platelets 150 - 400 K/uL 230 232 -   CMP Latest Ref Rng & Units 01/11/2019 01/10/2019 01/06/2019  Glucose 70 - 99 mg/dL 126(H) 102(H) 95  BUN 8 - 23 mg/dL 9 - 16  Creatinine 0.44 - 1.00 mg/dL 0.92  0.81 1.26(H)  Sodium 135 - 145 mmol/L 143 144 140  Potassium 3.5 - 5.1 mmol/L 3.5 3.1(L) 3.2(L)  Chloride 98 - 111 mmol/L 110 - 105  CO2 22 - 32 mmol/L 23 - 22  Calcium 8.9 - 10.3 mg/dL 9.0 - 10.0  Total Protein 6.5 - 8.1 g/dL - - -  Total Bilirubin 0.3 - 1.2 mg/dL - - -  Alkaline Phos 38 - 126 U/L - - -  AST 15 - 41 U/L - - -  ALT 0 - 44 U/L - - -     Imaging studies: No new pertinent imaging studies   Assessment/Plan:  68 y.o. female doing well with expected post-surgical soreness still awaiting gross evidence of bowel function return 1 Day Post-Op s/p hand-assisted extended right hemicolectomy for colon cancer of splenic flexure.   - Continue on clear liquid diet + Boost Breeze supplementation + IVF  - Pain control prn; antiemetics prn  - Monitor abdominal examination; on-going bowel function  - Discontinue foley catheter today  - Mobilization encouraged; discussed with RN; if difficult to mobilize will engage PT  - Encouraged IS use  - Medical management of comorbidities   All of the above findings and recommendations were discussed with the patient, and the medical team, and all of patient's questions were answered to her expressed satisfaction.  -- Edison Simon, PA-C [facility] 01/11/2019, 7:26 AM [phone] M-F: 7am - 4pm

## 2019-01-12 LAB — HIV ANTIBODY (ROUTINE TESTING W REFLEX): HIV Screen 4th Generation wRfx: NONREACTIVE

## 2019-01-12 MED ORDER — HYDROCODONE-ACETAMINOPHEN 5-325 MG PO TABS: 1.0000 | ORAL_TABLET | Freq: Four times a day (QID) | ORAL | 0 refills | Status: DC | PRN

## 2019-01-12 NOTE — Anesthesia Postprocedure Evaluation (Signed)
Anesthesia Post Note  Patient: Norm Salt  Procedure(s) Performed: HAND ASSISTED LAPAROSCOPIC RIGHT COLON RESECTION (Right )  Anesthesia Type: General Level of consciousness: awake and alert and oriented Pain management: pain level controlled Vital Signs Assessment: post-procedure vital signs reviewed and stable Respiratory status: spontaneous breathing Cardiovascular status: blood pressure returned to baseline Anesthetic complications: no     Last Vitals:  Vitals:   01/11/19 1952 01/12/19 0341  BP: 105/71 92/67  Pulse: 81 74  Resp: 20 16  Temp: 36.9 C 36.9 C  SpO2: 100% 100%    Last Pain:  Vitals:   01/12/19 1020  TempSrc:   PainSc: 3                  Manie Bealer

## 2019-01-12 NOTE — Discharge Instructions (Signed)
In addition to included general post-operative instructions for laparoscopic colectomy,  Diet: Resume home diet as toelrates.   Activity: No heavy lifting >20 pounds (children, pets, laundry, garbage) or strenuous activity until follow-up in 2 weeks, but light activity and walking are encouraged. Do not drive or drink alcohol if taking narcotic pain medications or having pain that might distract from driving.  Wound care: You may shower/get incision wet with soapy water and pat dry (do not rub incisions), but no baths or submerging incision underwater until follow-up.   Medications: Resume all home medications. For mild to moderate pain: acetaminophen (Tylenol) or ibuprofen/naproxen (if no kidney disease). Combining Tylenol with alcohol can substantially increase your risk of causing liver disease. Narcotic pain medications, if prescribed, can be used for severe pain, though may cause nausea, constipation, and drowsiness. Do not combine Tylenol and Percocet (or similar) within a 6 hour period as Percocet (and similar) contain(s) Tylenol. If you do not need the narcotic pain medication, you do not need to fill the prescription.  Call office [phone] / [phone]) at any time if any questions, worsening pain, fevers/chills, bleeding, drainage from incision site, or other concerns.

## 2019-01-12 NOTE — Plan of Care (Signed)
  Problem: Education: Goal: Knowledge of General Education information will improve Description: Including pain rating scale, medication(s)/side effects and non-pharmacologic comfort measures Outcome: Adequate for Discharge   Problem: Health Behavior/Discharge Planning: Goal: Ability to manage health-related needs will improve Outcome: Adequate for Discharge   Problem: Clinical Measurements: Goal: Ability to maintain clinical measurements within normal limits will improve Outcome: Adequate for Discharge Goal: Will remain free from infection Outcome: Adequate for Discharge Goal: Diagnostic test results will improve Outcome: Adequate for Discharge Goal: Respiratory complications will improve Outcome: Adequate for Discharge Goal: Cardiovascular complication will be avoided Outcome: Adequate for Discharge   Problem: Activity: Goal: Risk for activity intolerance will decrease Outcome: Adequate for Discharge   Problem: Nutrition: Goal: Adequate nutrition will be maintained Outcome: Adequate for Discharge   Problem: Coping: Goal: Level of anxiety will decrease Outcome: Adequate for Discharge   Problem: Elimination: Goal: Will not experience complications related to bowel motility Outcome: Adequate for Discharge Goal: Will not experience complications related to urinary retention Outcome: Adequate for Discharge   Problem: Pain Managment: Goal: General experience of comfort will improve Outcome: Adequate for Discharge   Problem: Safety: Goal: Ability to remain free from injury will improve Outcome: Adequate for Discharge   Problem: Skin Integrity: Goal: Risk for impaired skin integrity will decrease Outcome: Adequate for Discharge   Problem: Education: Goal: Required Educational Video(s) Outcome: Adequate for Discharge   Problem: Clinical Measurements: Goal: Ability to maintain clinical measurements within normal limits will improve Outcome: Adequate for  Discharge Goal: Postoperative complications will be avoided or minimized Outcome: Adequate for Discharge   Problem: Skin Integrity: Goal: Demonstration of wound healing without infection will improve Outcome: Adequate for Discharge   

## 2019-01-12 NOTE — Discharge Summary (Addendum)
[facility] SURGICAL DISCHARGE SUMMARY  Patient ID: Carrie Alvarez MRN: [id number] DOB/AGE: 1950/08/30 68 y.o.  Admit date: 01/10/2019 Discharge date: 01/12/2019  Discharge Diagnoses Patient Active Problem List   Diagnosis Date Noted  . Colon cancer ([place]) 01/10/2019  . Personal history of colonic polyps   . Polyp of transverse colon   . Neoplasm of digestive system     Consultants None  Procedures 01/10/2019: 1. Hand assisted Laparoscopic Extended Right Hemicolectomy including splenic flexure With stapled ileocolostomy 2. Laparoscopic small bowel resection 3. Laparoscopic Lysis of adhesions 4. laparoscopic takedown of splenic flexure  HPI: Carrie Alvarez is a 68 y.o. female with a history of splenic flexure adenocarcinoma and transverse colon polyp consistent with high grade dysplasia who presents to [facility] on 06/30 for scheduled extended right hemicolectomy with Dr Dahlia Byes.   Hospital Course: Informed consent was obtained and documented, and patient underwent uneventful hand assisted extended right hemicolectomy (Dr Dahlia Byes, 01/10/2019).  Post-operatively, patient's pain improved/resolved and advancement of patient's diet and ambulation were well-tolerated. The remainder of patient's hospital course was essentially unremarkable, and discharge planning was initiated accordingly with patient safely able to be discharged home with appropriate discharge instructions, pain control, and outpatient follow-up after all of her questions were answered to her expressed satisfaction.  Discharge Condition: Good   Physical Examination:  Constitutional: Well appearing female, NAD Pulmonary: Normal effort, no respiratory distress Gastrointestinal: Soft, expected incisional tenderness, non-distended, no rebound / guarding Skin: Laparotomy incision is CDI with staples, no erythema or drainage. Laparoscopic incisions are CDI with dermabond, no erythema or  drainage   Allergies as of 01/12/2019   No Known Allergies     Medication List    STOP taking these medications   Oxycodone HCl 10 MG Tabs     TAKE these medications   acetaminophen 500 MG tablet Commonly known as: TYLENOL Take 500 mg by mouth every 6 (six) hours as needed for mild pain or moderate pain.   albuterol 108 (90 Base) MCG/ACT inhaler Commonly known as: VENTOLIN HFA Inhale 2 puffs into the lungs every 6 (six) hours as needed for wheezing or shortness of breath.   amLODipine 10 MG tablet Commonly known as: NORVASC Take 1 tablet (10 mg total) by mouth daily.   atorvastatin 10 MG tablet Commonly known as: LIPITOR Take 1 tablet (10 mg total) by mouth daily.   baclofen 10 MG tablet Commonly known as: LIORESAL Take 1 tablet (10 mg total) by mouth 2 (two) times daily. What changed:   when to take this  reasons to take this   bisacodyl 5 MG EC tablet Generic drug: bisacodyl Take 4 tablets (5 mg each) at 8 am the day before surgery.   cholecalciferol 1000 units tablet Commonly known as: VITAMIN D Take 1,000 Units by mouth daily.   Erythromycin 500 MG Tbec Take 500 mg by mouth as directed. Take two tablets at 8 am, two tablets at 2 pm, and two tablets at 8 pm the day before surgery.   fluticasone 50 MCG/ACT nasal spray Commonly known as: FLONASE USE 2 SPRAY(S) IN EACH NOSTRIL AS NEEDED FOR ALLERGIES OR RHINITIS. What changed: See the new instructions.   gabapentin 300 MG capsule Commonly known as: NEURONTIN Take 300-600 mg by mouth 4 (four) times daily as needed (neuropathic pain).   HYDROcodone-acetaminophen 5-325 MG tablet Commonly known as: NORCO/VICODIN Take 1 tablet by mouth every 6 (six) hours as needed for moderate pain.   letrozole 2.5 MG tablet Commonly known  asAlysia Penna Take 1 tablet (2.5 mg total) by mouth daily.   neomycin 500 MG tablet Commonly known as: MYCIFRADIN Take two tablets at 8 am, two tablets at 2 pm, and two tablets at 8 pm  the day before surgery.   ondansetron 4 MG disintegrating tablet Commonly known as: Zofran ODT Take 1 tablet (4 mg total) by mouth every 8 (eight) hours as needed for nausea or vomiting.   polyethylene glycol powder 17 GM/SCOOP powder Commonly known as: MiraLax 255 grams one bottle for bowel prep   potassium chloride SA 20 MEQ tablet Commonly known as: K-DUR Take 1 tablet (20 mEq total) by mouth 2 (two) times daily.   pyridoxine 100 MG tablet Commonly known as: B-6 Take 100 mg by mouth daily.   sertraline 100 MG tablet Commonly known as: Zoloft Take 2 tablets (200 mg total) by mouth daily.   traMADol 50 MG tablet Commonly known as: Ultram Take 1 tablet (50 mg total) by mouth every 6 (six) hours as needed.   vitamin B-12 500 MCG tablet Commonly known as: CYANOCOBALAMIN Take 500 mcg by mouth daily.      Follow-up Information    Pabon, [place] F, MD. Schedule an appointment as soon as possible for a visit on 01/18/2019.   Specialty: General Surgery Why: s/p laparoscopic extended right hemicolectomy Contact information: [address] [place] [address] [phone]            Time spent on discharge management including discussion of hospital course, clinical condition, outpatient instructions, prescriptions, and follow up with the patient and members of the medical team: >30 minutes  -- Edison Simon , PA-C [facility]  01/12/2019, 10:39 AM [phone] M-F: 7am - 4pm

## 2019-01-14 NOTE — Progress Notes (Signed)
[place]  [phone] [phone]  ID: Carrie Alvarez OB: 15-May-1951  MR#: [id number]  VEL#:[id number]  Patient Care Team: Paulene Floor as PCP - General (Physician Assistant) [name], MD as Consulting Physician (Oncology) [name], MD as Attending Physician (Pain Medicine) [name]., MD as Consulting Physician (Ophthalmology) [name], RN as Oncology Nurse Navigator  CHIEF COMPLAINT: Stage IIa ER+ left breast cancer with no breast lesion and axillary lymph node metastasis.  Now with stage IIIa colon cancer.  INTERVAL HISTORY: Patient returns to clinic today as an add-on and postoperative follow-up to discuss her new diagnosis and additional treatment planning.  Patient recently underwent screening colonoscopy and is found to have approximately 2 cm lesion that was positive for biopsy.  Subsequent surgery revealed 1 of 34 lymph nodes positive for disease.  Patient continues to have some mild pain from her surgery, but otherwise feels well.  She continues to tolerate letrozole only with occasional hot flashes.  She continues to have a mild peripheral neuropathy, but no other neurologic complaints.  She has a good appetite and denies weight loss.  She denies any recent fevers or illnesses.  She has no chest pain, shortness of breath, cough, or hemoptysis.  She denies any nausea, vomiting, constipation, or diarrhea.  She has no melena or hematochezia.  She has no urinary complaints.  Patient offers no further specific complaints today.  REVIEW OF SYSTEMS:   Review of Systems  Constitutional: Negative.  Negative for fever, malaise/fatigue and weight loss.  Respiratory: Negative.  Negative for cough and shortness of breath.   Cardiovascular: Negative.  Negative for chest pain and leg swelling.  Gastrointestinal: Negative.  Negative for abdominal pain, blood in stool, constipation, diarrhea, melena, nausea  and vomiting.  Genitourinary: Negative.  Negative for dysuria.  Musculoskeletal: Negative.  Negative for back pain.  Skin: Negative.  Negative for rash.  Neurological: Positive for sensory change. Negative for dizziness, focal weakness, weakness and headaches.  Psychiatric/Behavioral: Negative.  Negative for depression. The patient is not nervous/anxious.     As per HPI. Otherwise, a complete review of systems is negative.  PAST MEDICAL HISTORY: Past Medical History:  Diagnosis Date  . Allergy   . Anemia   . Back pain   . Breast cancer ([place]) 2015   LT LUMPECTOMY 12-2014 FOLLOWING CHEMO  . Carpal tunnel syndrome    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  . Cataract   . Colon cancer ([place]) 2020  . Depression   . GERD (gastroesophageal reflux disease)    problem with reflux during chemo  . Hypercholesteremia   . Hypertension   . Lumbosacral pain    sees pain management in [place]  . Obesity   . Personal history of chemotherapy 2016  . Pinched nerve    left side, lumbar area  . Uterine cancer ([place]) 1994    PAST SURGICAL HISTORY: Past Surgical History:  Procedure Laterality Date  . ABDOMINAL HYSTERECTOMY  1994   UTERINE CA  . AXILLARY LYMPH NODE BIOPSY Left 12/18/2014   Procedure: AXILLARY LYMPH NODE BIOPSY/;  Surgeon: Robert Bellow, MD;  Location: [facility];  Service: General;  Laterality: Left;  . AXILLARY LYMPH NODE DISSECTION Left 12/18/2014   Procedure: AXILLARY LYMPH NODE DISSECTION;  Surgeon: Robert Bellow, MD;  Location: [facility];  Service: General;  Laterality: Left;  . BREAST BIOPSY Left 05/2014   CORE BX OF LN, METASTATIC ADENOCARCINOMA  . BREAST  LUMPECTOMY Left 12/2014   CHEMO FIRST THEN SURGERY OF LN  . BREAST SURGERY     lymph node removal  . CHOLECYSTECTOMY N/A 04/19/2016   Procedure: LAPAROSCOPIC CHOLECYSTECTOMY WITH INTRAOPERATIVE CHOLANGIOGRAM;  Surgeon: Hubbard Robinson, MD;  Location: [facility];  Service: General;  Laterality: N/A;  . COLON  RESECTION Right 01/10/2019   Procedure: HAND ASSISTED LAPAROSCOPIC RIGHT COLON RESECTION;  Surgeon: Jules Husbands, MD;  Location: [facility];  Service: General;  Laterality: Right;  . COLONOSCOPY WITH PROPOFOL N/A 12/30/2018   Procedure: COLONOSCOPY WITH PROPOFOL;  Surgeon: Lucilla Lame, MD;  Location: [place];  Service: Endoscopy;  Laterality: N/A;  . medial branch block  11/06/2015   lumbar facet Dr. Primus Bravo  . POLYPECTOMY N/A 12/30/2018   Procedure: POLYPECTOMY;  Surgeon: Lucilla Lame, MD;  Location: [place];  Service: Endoscopy;  Laterality: N/A;  Clips placed at Hepatic Flexure Polyp (2) and Transverse Colon Polyp (4) removal sites  . SENTINEL NODE BIOPSY Left 12/18/2014   Procedure: SENTINEL NODE BIOPSY;  Surgeon: Robert Bellow, MD;  Location: [facility];  Service: General;  Laterality: Left;    FAMILY HISTORY Family History  Problem Relation Age of Onset  . Breast cancer Sister 72  . Colon cancer Sister   . Colon cancer Mother   . Hypertension Mother   . Asthma Mother   . Cancer Father        ADVANCED DIRECTIVES:    HEALTH MAINTENANCE: Social History   Tobacco Use  . Smoking status: Former Smoker    Packs/day: 0.50    Types: Cigarettes    Quit date: 07/18/2014    Years since quitting: 4.5  . Smokeless tobacco: Never Used  Substance Use Topics  . Alcohol use: No    Alcohol/week: 0.0 standard drinks  . Drug use: No    No Known Allergies  Current Outpatient Medications  Medication Sig Dispense Refill  . acetaminophen (TYLENOL) 500 MG tablet Take 500 mg by mouth every 6 (six) hours as needed for mild pain or moderate pain.     Marland Kitchen albuterol (PROVENTIL HFA;VENTOLIN HFA) 108 (90 Base) MCG/ACT inhaler Inhale 2 puffs into the lungs every 6 (six) hours as needed for wheezing or shortness of breath. 1 Inhaler 2  . amLODipine (NORVASC) 10 MG tablet Take 1 tablet (10 mg total) by mouth daily. 90 tablet 0  . atorvastatin (LIPITOR) 10 MG tablet Take 1 tablet  (10 mg total) by mouth daily. 90 tablet 0  . baclofen (LIORESAL) 10 MG tablet Take 1 tablet (10 mg total) by mouth 2 (two) times daily. (Patient taking differently: Take 10 mg by mouth 3 (three) times daily as needed for muscle spasms. ) 30 each 0  . bisacodyl (BISACODYL) 5 MG EC tablet Take 4 tablets (5 mg each) at 8 am the day before surgery. 4 tablet 0  . cholecalciferol (VITAMIN D) 1000 units tablet Take 1,000 Units by mouth daily.    . fluticasone (FLONASE) 50 MCG/ACT nasal spray USE 2 SPRAY(S) IN EACH NOSTRIL AS NEEDED FOR ALLERGIES OR RHINITIS. (Patient taking differently: Place 2 sprays into both nostrils daily as needed for allergies or rhinitis. ) 16 g 0  . gabapentin (NEURONTIN) 300 MG capsule Take 300-600 mg by mouth 4 (four) times daily as needed (neuropathic pain).     Marland Kitchen HYDROcodone-acetaminophen (NORCO/VICODIN) 5-325 MG tablet Take 1 tablet by mouth every 6 (six) hours as needed for moderate pain. 30 tablet 0  . letrozole (FEMARA) 2.5  MG tablet Take 1 tablet (2.5 mg total) by mouth daily. 90 tablet 3  . ondansetron (ZOFRAN ODT) 4 MG disintegrating tablet Take 1 tablet (4 mg total) by mouth every 8 (eight) hours as needed for nausea or vomiting. 20 tablet 0  . polyethylene glycol powder (MIRALAX) 17 GM/SCOOP powder 255 grams one bottle for bowel prep 255 g 0  . potassium chloride SA (K-DUR) 20 MEQ tablet Take 1 tablet (20 mEq total) by mouth 2 (two) times daily. 10 tablet 0  . pyridoxine (B-6) 100 MG tablet Take 100 mg by mouth daily.    . sertraline (ZOLOFT) 100 MG tablet Take 2 tablets (200 mg total) by mouth daily. 180 tablet 0  . traMADol (ULTRAM) 50 MG tablet Take 1 tablet (50 mg total) by mouth every 6 (six) hours as needed. 20 tablet 0  . vitamin B-12 (CYANOCOBALAMIN) 500 MCG tablet Take 500 mcg by mouth daily.     No current facility-administered medications for this visit.    Facility-Administered Medications Ordered in Other Visits  Medication Dose Route Frequency Provider  Last Rate Last Dose  . heparin lock flush 100 unit/mL  500 Units Intravenous Once [name], MD        OBJECTIVE: Vitals:   01/19/19 1509  BP: 107/82  Pulse: (!) 105  Temp: (!) 95.9 F (35.5 C)  SpO2: 100%     Body mass index is 33.48 kg/m.    ECOG FS:1 - Symptomatic but completely ambulatory  General: Well-developed, well-nourished, no acute distress. Eyes: Pink conjunctiva, anicteric sclera. HEENT: Normocephalic, moist mucous membranes, clear oropharnyx. Breast: Exam deferred today. Lungs: Clear to auscultation bilaterally. Heart: Regular rate and rhythm. No rubs, murmurs, or gallops. Abdomen: Soft, nontender, nondistended. No organomegaly noted, normoactive bowel sounds.  Well-healing surgical scar. Musculoskeletal: No edema, cyanosis, or clubbing. Neuro: Alert, answering all questions appropriately. Cranial nerves grossly intact. Skin: No rashes or petechiae noted. Psych: Normal affect. Lymphatics: No cervical, calvicular, axillary or inguinal LAD.  LAB RESULTS:  Lab Results  Component Value Date   NA 143 01/11/2019   K 3.5 01/11/2019   CL 110 01/11/2019   CO2 23 01/11/2019   GLUCOSE 126 (H) 01/11/2019   BUN 9 01/11/2019   CREATININE 0.92 01/11/2019   CALCIUM 9.0 01/11/2019   PROT 8.3 (H) 01/02/2019   ALBUMIN 4.5 01/02/2019   AST 25 01/02/2019   ALT 17 01/02/2019   ALKPHOS 82 01/02/2019   BILITOT 1.5 (H) 01/02/2019   GFRNONAA >60 01/11/2019   GFRAA >60 01/11/2019    Lab Results  Component Value Date   WBC 15.4 (H) 01/11/2019   NEUTROABS 8.3 (H) 01/06/2019   HGB 11.2 (L) 01/11/2019   HCT 32.1 (L) 01/11/2019   MCV 79.1 (L) 01/11/2019   PLT 230 01/11/2019     STUDIES: Ct Abdomen Pelvis W Contrast  Result Date: 01/02/2019 CLINICAL DATA:  68 year old female with history of splenic flexure mass and multiple colonic polyps status post polypectomy last Friday. Abdominal pain since colonoscopy. Pain currently 8 or 9 out of 10, sharp and constant.  No fevers or chills. Some emesis. EXAM: CT ABDOMEN AND PELVIS WITH CONTRAST TECHNIQUE: Multidetector CT imaging of the abdomen and pelvis was performed using the standard protocol following bolus administration of intravenous contrast. CONTRAST:  14m OMNIPAQUE IOHEXOL 300 MG/ML  SOLN COMPARISON:  CT the abdomen and pelvis 09/20/2017. FINDINGS: Lower chest: Unremarkable. Hepatobiliary: Liver has a slightly shrunken appearance and nodular contour, suggesting underlying cirrhosis. No suspicious cystic or  solid hepatic lesions. No intra or extrahepatic biliary ductal dilatation. Status post cholecystectomy. Pancreas: No pancreatic mass. No pancreatic ductal dilatation. No pancreatic or peripancreatic fluid or inflammatory changes. Spleen: Unremarkable. Adrenals/Urinary Tract: Bilateral kidneys and adrenal glands are normal in appearance. No hydroureteronephrosis. Urinary bladder is normal in appearance. Stomach/Bowel: Normal appearance of the stomach. No pathologic dilatation of small bowel or colon. Surgical clips in the mid transverse colon and in the proximal ascending colon, presumably at sites of recent polypectomy. Normal appendix. Vascular/Lymphatic: Aortic atherosclerosis, without evidence of aneurysm or dissection in the abdominal or pelvic vasculature. No lymphadenopathy noted in the abdomen or pelvis. Reproductive: Status post hysterectomy. Ovaries are not confidently identified may be surgically absent or atrophic. Other: No significant volume of ascites.  No pneumoperitoneum. Musculoskeletal: There are no aggressive appearing lytic or blastic lesions noted in the visualized portions of the skeleton. IMPRESSION: 1. No acute findings are noted in the abdomen or pelvis to account for the patient's symptoms. 2. Postprocedural changes of polypectomy noted in the ascending colon and mid transverse colon. 3. Subtle morphologic changes in the liver suggestive of early cirrhosis. 4. Aortic atherosclerosis. 5.  Additional incidental findings, as above. Electronically Signed   By: Vinnie Langton M.D.   On: 01/02/2019 13:08   Dg Abdomen Acute W/chest  Result Date: 01/02/2019 CLINICAL DATA:  Abdominal pain after colonoscopy 12/30/2018. EXAM: DG ABDOMEN ACUTE W/ 1V CHEST COMPARISON:  PA and lateral chest 12/28/2016. CT abdomen and pelvis 09/20/2017. FINDINGS: Single-view of the chest demonstrates clear lungs and normal heart size. The aorta is tortuous, unchanged. No pneumothorax or pleural effusion. Two views of the abdomen show no free intraperitoneal air. Bowel gas pattern is normal. Surgical clips in the right mid and upper abdomen noted. No acute bony abnormality. No abnormal abdominal calcification. IMPRESSION: No acute finding chest or abdomen. Electronically Signed   By: Inge Rise M.D.   On: 01/02/2019 12:42    ASSESSMENT: Stage IIa ER+ left breast cancer with no breast lesion and axillary lymph node metastasis. ([id number])  HER-2 negative.  Now with stage IIIa colon cancer.  PLAN:   1.  Stage IIIa colon cancer: Pathology report reviewed independently.  Patient also had a CT scan of her abdomen on January 02, 2019 that did not reveal metastatic disease.  Given the stage of her disease, she will benefit from adjuvant chemotherapy using FOLFOX.  Patient also has expressed interest in clinical trial which adds atezolizumab if patient is deficient in MMR.  Patient will require port placement prior to initiating treatment.  Return to clinic on February 12, 2021 initiate cycle 1 of 12 adjuvant FOLFOX. 2. Stage IIa ER+ left breast cancer with no breast lesion and axillary lymph node metastasis: Despite no obvious breast lesion on mammogram or breast MRI, pathology and pattern of spread was consistent with primary breast cancer. CT and bone scan at time of diagnosis revealed no other evidence of malignancy.  She only received 3 cycles of Adriamycin and Cytoxan.  Taxol was discontinued early as well secondary to  persistent peripheral neuropathy. Patient completed her chemotherapy on Nov 19, 2014.  She elected not to pursue axillary node dissection given the potential morbidity of this procedure. It was also elected not to pursue adjuvant XRT given there was no primary breast lesion.  Continue adjuvant letrozole for a total of 5 years completing in July 2021.  Continuing treatment for an additional 2 to 3 years was previously discussed, but patient did not expressed interest in  extending treatment beyond 5 years.  Her most recent mammogram on June 29, 2018 was reported as BI-RADS 2.  Repeat in December 2020.  Continue to monitor throughout treatment for colon cancer. 3.  Bone health: Patient's most recent bone mineral density on February 03, 2018 reported T score of -0.7 which is unchanged from 3 years prior.  Consider repeat bone mineral density in July 2021.  4.  Family history: Patient's sister reports she has positive for Lynch syndrome, but these results were never confirmed.  Patient will require genetic testing in the future. 3. Pain/Peripheral neuropathy: Chronic and unchanged.  Monitor closely while receiving oxalic platinum. 4. Hot flashes: Likely secondary to letrozole.  Previously, patient was offered a change in treatment or treatment for her hot flashes, both of which she declined.     Patient expressed understanding and was in agreement with this plan. She also understands that She can call clinic at any time with any questions, concerns, or complaints.   Breast cancer metastasized to axillary lymph node   Staging form: Breast, AJCC 7th Edition     Clinical stage from 10/22/2014: Stage Unknown (TX, N1, M0) - Signed by [name], MD on 10/22/2014   [name], MD   01/22/2019 8:02 AM

## 2019-01-17 ENCOUNTER — Other Ambulatory Visit: Payer: Self-pay | Admitting: *Deleted

## 2019-01-17 DIAGNOSIS — C50912 Malignant neoplasm of unspecified site of left female breast: Secondary | ICD-10-CM

## 2019-01-17 DIAGNOSIS — C773 Secondary and unspecified malignant neoplasm of axilla and upper limb lymph nodes: Secondary | ICD-10-CM

## 2019-01-17 NOTE — Progress Notes (Signed)
am

## 2019-01-18 ENCOUNTER — Encounter: Payer: Self-pay | Admitting: Surgery

## 2019-01-18 ENCOUNTER — Ambulatory Visit (INDEPENDENT_AMBULATORY_CARE_PROVIDER_SITE_OTHER): Payer: Self-pay | Admitting: Surgery

## 2019-01-18 ENCOUNTER — Other Ambulatory Visit: Payer: Self-pay

## 2019-01-18 VITALS — BP 108/76 | HR 103 | Temp 97.9°F | Ht 63.0 in | Wt 189.0 lb

## 2019-01-18 DIAGNOSIS — Z09 Encounter for follow-up examination after completed treatment for conditions other than malignant neoplasm: Secondary | ICD-10-CM

## 2019-01-18 NOTE — Progress Notes (Signed)
S/p Right extended colectomy Path d/w pt in detail 1/32 node positive for CA She is doing well Some diarrhea, no fevers or chills   PE NAD, abd soft, no infection, or peritonitis Staples removed  A/p Doing well Oncology f/u w Dr. Grayland Ormond tomorrow for further discussion  We will be happy to place port depending on oncology recs No surgical issues at this time

## 2019-01-18 NOTE — Patient Instructions (Addendum)
The patient is aware to call back for any questions or new concerns.  Keep appointment with Dr Grayland Ormond   May use Imodium every 6 hours as needed for diarrhea

## 2019-01-18 NOTE — H&P (View-Only) (Signed)
S/p Right extended colectomy Path d/w pt in detail 1/32 node positive for CA She is doing well Some diarrhea, no fevers or chills   PE NAD, abd soft, no infection, or peritonitis Staples removed  A/p Doing well Oncology f/u w Dr. Grayland Ormond tomorrow for further discussion  We will be happy to place port depending on oncology recs No surgical issues at this time

## 2019-01-19 ENCOUNTER — Other Ambulatory Visit: Payer: Self-pay

## 2019-01-19 ENCOUNTER — Encounter: Payer: Self-pay | Admitting: Oncology

## 2019-01-19 ENCOUNTER — Inpatient Hospital Stay: Payer: Medicare Other | Attending: Oncology | Admitting: Oncology

## 2019-01-19 VITALS — BP 107/82 | HR 105 | Temp 95.9°F | Ht 63.0 in | Wt 189.0 lb

## 2019-01-19 DIAGNOSIS — C184 Malignant neoplasm of transverse colon: Secondary | ICD-10-CM

## 2019-01-19 DIAGNOSIS — Z170 Estrogen receptor positive status [ER+]: Secondary | ICD-10-CM | POA: Insufficient documentation

## 2019-01-19 DIAGNOSIS — Z9221 Personal history of antineoplastic chemotherapy: Secondary | ICD-10-CM | POA: Diagnosis not present

## 2019-01-19 DIAGNOSIS — C773 Secondary and unspecified malignant neoplasm of axilla and upper limb lymph nodes: Secondary | ICD-10-CM

## 2019-01-19 DIAGNOSIS — N951 Menopausal and female climacteric states: Secondary | ICD-10-CM | POA: Diagnosis not present

## 2019-01-19 DIAGNOSIS — Z809 Family history of malignant neoplasm, unspecified: Secondary | ICD-10-CM | POA: Diagnosis not present

## 2019-01-19 DIAGNOSIS — Z79811 Long term (current) use of aromatase inhibitors: Secondary | ICD-10-CM | POA: Diagnosis not present

## 2019-01-19 DIAGNOSIS — G8929 Other chronic pain: Secondary | ICD-10-CM | POA: Diagnosis not present

## 2019-01-19 DIAGNOSIS — Z800 Family history of malignant neoplasm of digestive organs: Secondary | ICD-10-CM | POA: Diagnosis not present

## 2019-01-19 DIAGNOSIS — Z8542 Personal history of malignant neoplasm of other parts of uterus: Secondary | ICD-10-CM | POA: Insufficient documentation

## 2019-01-19 DIAGNOSIS — G620 Drug-induced polyneuropathy: Secondary | ICD-10-CM | POA: Diagnosis not present

## 2019-01-19 DIAGNOSIS — I10 Essential (primary) hypertension: Secondary | ICD-10-CM | POA: Diagnosis not present

## 2019-01-19 DIAGNOSIS — Z87891 Personal history of nicotine dependence: Secondary | ICD-10-CM | POA: Insufficient documentation

## 2019-01-19 DIAGNOSIS — Z90710 Acquired absence of both cervix and uterus: Secondary | ICD-10-CM | POA: Diagnosis not present

## 2019-01-19 DIAGNOSIS — Z803 Family history of malignant neoplasm of breast: Secondary | ICD-10-CM | POA: Insufficient documentation

## 2019-01-19 DIAGNOSIS — C189 Malignant neoplasm of colon, unspecified: Secondary | ICD-10-CM | POA: Diagnosis present

## 2019-01-19 DIAGNOSIS — C50912 Malignant neoplasm of unspecified site of left female breast: Secondary | ICD-10-CM | POA: Diagnosis not present

## 2019-01-19 DIAGNOSIS — Z7189 Other specified counseling: Secondary | ICD-10-CM

## 2019-01-19 NOTE — Progress Notes (Signed)
Patient stated that she had been doing well. 

## 2019-01-20 ENCOUNTER — Encounter: Payer: Self-pay | Admitting: *Deleted

## 2019-01-20 DIAGNOSIS — C184 Malignant neoplasm of transverse colon: Secondary | ICD-10-CM

## 2019-01-20 NOTE — Research (Signed)
T/C attempted this afternoon to follow up Carrie Alvarez interest in the Carrie Alvarez study for CRC. Dr. Grayland Ormond saw the patient in clinic yesterday and discussed the study with her and provided her with a copy of the informed consent form to take home and review. Dr. Grayland Ormond reported the patient is interested and would like additional information. Message left for patient to call me back at her convenience to talk about the study. Carrie Alvarez, BSN, MHA, OCN 01/20/2019 3:23 PM   Patient Carrie Alvarez presented to the [facility] this morning to provide written consent for the [id number] [name] study. The patient was alone due to COVID-19 restrictions in place and she was taken to the triage room for consent since she has just had another COVID-19 test for her port-placement surgery on Thursday. Consent form version date02/05/19 for the [id number] study and accompanying HIPAA form  Dated 03/24/16 were reviewed with patient page-by-page allowing her to ask any questions she had. Patient states she had read over the consent before and did not have any additional questions. Stressed to patient that participation is strictly voluntary and that she can withdraw without penalty at any time. Also informed that her insurance will be responsible for most of the cost of her care, ubt that the study will pay for the TSH and urinalysis as well as provide the study drug atezolizumab free of charge if she is randomized to that arm. Explained the randomization process to her and reviewed the chemo schedule for each arm. List of ALL side effects read to the patient as well as instructions on what to do if she experiences any of these side effects. Patient states she was offered chemo class, but had been through it before and declined. Reviewed with patient how the FolFox will be administered and that she will have a 5FU pump for 48 hours to wear at home. Patient had been informed of thes previously.  Risks and benefits were reviewed with patient and she elected not to participate in the optional QOL study. She did afree to the optional blood, stool and tissue portion of the study. Patient provided written consent to participate in the above mentioned study signing the ICF, HIPAA, Carrie Alvarez and Carrie Alvarez tissue consents. Patient was given copies of all signed consent forms as well as my contact information should she have any additional questions. She presented in a wheelchair today, but states this is due to abdominal pain and weakness from her recent colectomy. Patient was informed prior to consenting that we will need to schedule a chest CT as well as study labs, and these will be scheduled at a later date following her port placement. Dr. Grayland Ormond was notified that patient has consented to participate in the [name] study this morning and that I will be requesting a chest CT and some labs for the study as well. Carrie Alvarez, BSN, MHA, OCN 01/31/2019 10:19 AM  T/C to Carrie Alvarez to inform of new appointments next week. She is scheduled to come in for Central and local study labs on 02/07/2019 at 9:00 AM and then will go to [facility] to have her chest CT at 10:00. Patient instructed that she is to have only liguids for 4 hours prior to her CT scan. She states she is able to make these appointments next week. Instructed her to call me back if she has any questions. Patient will also complete her study questionnaires while she is here for her labwork next week. Tumor tissue for  the study has been requested from the pathology lab. Will send stool specimen kit home with patient next week as well. Carrie Alvarez, BSN, MHA, OCN 01/31/2019 1:51 PM

## 2019-01-22 DIAGNOSIS — Z7189 Other specified counseling: Secondary | ICD-10-CM | POA: Insufficient documentation

## 2019-01-22 MED ORDER — LIDOCAINE-PRILOCAINE 2.5-2.5 % EX CREA: TOPICAL_CREAM | CUTANEOUS | 3 refills | Status: DC

## 2019-01-22 NOTE — Progress Notes (Signed)
START ON PATHWAY REGIMEN - Colorectal     A cycle is every 14 days:     Oxaliplatin      Leucovorin      Fluorouracil      Fluorouracil   **Always confirm dose/schedule in your pharmacy ordering system**  Patient Characteristics: Postoperative without Neoadjuvant Therapy (Pathologic Staging), Colon, Stage III, Low Risk (pT1-3, pN1) Tumor Location: Colon Therapeutic Status: Postoperative without Neoadjuvant Therapy (Pathologic Staging) AJCC M Category: cM0 AJCC T Category: pT1 AJCC N Category: pN1a AJCC 8 Stage Grouping: IIIA Intent of Therapy: Curative Intent, Discussed with Patient

## 2019-01-25 ENCOUNTER — Telehealth: Payer: Self-pay | Admitting: *Deleted

## 2019-01-25 ENCOUNTER — Telehealth (INDEPENDENT_AMBULATORY_CARE_PROVIDER_SITE_OTHER): Payer: Medicare Other | Admitting: Surgery

## 2019-01-25 ENCOUNTER — Other Ambulatory Visit: Payer: Self-pay

## 2019-01-25 DIAGNOSIS — C184 Malignant neoplasm of transverse colon: Secondary | ICD-10-CM

## 2019-01-25 NOTE — Progress Notes (Signed)
S/p Extended right colectomy for adenoCA 1/ 34 nodes + met Phone call placed, discussion with the pt She is in need for chemo She is otherwise doing well No abd pain, taking PO, no fevers or chills  A/p We will schedule port. D/W the pt in detail about the procedure, risks, benefits and possible complications ( PTX, vascular injury, bleeding, infection) She understands and wishes to proceed.

## 2019-01-25 NOTE — Telephone Encounter (Signed)
Patient's surgery to be scheduled for 02-02-19 at [facility] with Dr. Dahlia Byes.  The patient is aware to have COVID-19 testing done on 01-31-19 at the [place] building drive thru ([address] [place]) between 8:00 am and 10:30 am. She is aware to isolate after, have no visitors, wash hands frequently, and avoid touching face.   The patient is aware she will be contacted by the [facility] to complete a phone interview sometime in the near future. Patient recently had surgery on 01-10-19.   Patient aware to be NPO after midnight and have a driver.   She is aware to check in at the [facility] entrance where she will be screened for the coronavirus and then sent to Same Day Surgery.   Patient aware that she may have one visitor and driver will need to wait in the car due to COVID-19 restrictions.   The patient verbalizes understanding of the above.   The patient is aware to call the office should she have further questions.

## 2019-01-30 ENCOUNTER — Encounter
Admission: RE | Admit: 2019-01-30 | Discharge: 2019-01-30 | Disposition: A | Discharge status: Home / Self Care | Payer: Medicare Other | Source: Ambulatory Visit | Attending: Surgery | Admitting: Surgery

## 2019-01-30 ENCOUNTER — Encounter: Payer: Self-pay | Admitting: Surgery

## 2019-01-30 DIAGNOSIS — Z6833 Body mass index (BMI) 33.0-33.9, adult: Secondary | ICD-10-CM | POA: Diagnosis not present

## 2019-01-30 DIAGNOSIS — E7800 Pure hypercholesterolemia, unspecified: Secondary | ICD-10-CM | POA: Diagnosis not present

## 2019-01-30 DIAGNOSIS — Z9049 Acquired absence of other specified parts of digestive tract: Secondary | ICD-10-CM | POA: Diagnosis not present

## 2019-01-30 DIAGNOSIS — F329 Major depressive disorder, single episode, unspecified: Secondary | ICD-10-CM | POA: Diagnosis not present

## 2019-01-30 DIAGNOSIS — Z87891 Personal history of nicotine dependence: Secondary | ICD-10-CM | POA: Diagnosis not present

## 2019-01-30 DIAGNOSIS — C189 Malignant neoplasm of colon, unspecified: Secondary | ICD-10-CM | POA: Diagnosis present

## 2019-01-30 DIAGNOSIS — I10 Essential (primary) hypertension: Secondary | ICD-10-CM | POA: Diagnosis not present

## 2019-01-30 DIAGNOSIS — M1990 Unspecified osteoarthritis, unspecified site: Secondary | ICD-10-CM | POA: Diagnosis not present

## 2019-01-30 DIAGNOSIS — Z853 Personal history of malignant neoplasm of breast: Secondary | ICD-10-CM | POA: Diagnosis not present

## 2019-01-30 DIAGNOSIS — C772 Secondary and unspecified malignant neoplasm of intra-abdominal lymph nodes: Secondary | ICD-10-CM | POA: Diagnosis not present

## 2019-01-30 DIAGNOSIS — Z1159 Encounter for screening for other viral diseases: Secondary | ICD-10-CM | POA: Insufficient documentation

## 2019-01-30 DIAGNOSIS — Z01812 Encounter for preprocedural laboratory examination: Secondary | ICD-10-CM | POA: Insufficient documentation

## 2019-01-30 DIAGNOSIS — Z8542 Personal history of malignant neoplasm of other parts of uterus: Secondary | ICD-10-CM | POA: Diagnosis not present

## 2019-01-30 DIAGNOSIS — E669 Obesity, unspecified: Secondary | ICD-10-CM | POA: Diagnosis not present

## 2019-01-30 HISTORY — DX: Personal history of Methicillin resistant Staphylococcus aureus infection: Z86.14

## 2019-01-30 NOTE — Patient Instructions (Signed)
Your procedure is scheduled on: 02-02-19 THURSDAY Report to Same Day Surgery 2nd floor medical mall [facility] Entrance-take elevator on left to 2nd floor.  Check in with surgery information desk.) To find out your arrival time please call [phone] between 1PM - 3PM on 02-01-19 [facility]  Remember: Instructions that are not followed completely may result in serious medical risk, up to and including death, or upon the discretion of your surgeon and anesthesiologist your surgery may need to be rescheduled.    _x___ 1. Do not eat food after midnight the night before your procedure. NO GUM OR CANDY AFTER MIDNIGHT.  You may drink clear liquids up to 2 hours before you are scheduled to arrive at the hospital for your procedure.  Do not drink clear liquids within 2 hours of your scheduled arrival to the hospital.  Clear liquids include  --Water or Apple juice without pulp  --Clear carbohydrate beverage such as ClearFast or Gatorade  --Black Coffee or Clear Tea (No milk, no creamers, do not add anything to the coffee or Tea   ____Ensure clear carbohydrate drink on the way to the hospital for bariatric patients  ____Ensure clear carbohydrate drink 3 hours before surgery for Dr Dwyane Luo patients if physician instructed.    __x__ 2. No Alcohol for 24 hours before or after surgery.   __x__3. No Smoking or e-cigarettes for 24 prior to surgery.  Do not use any chewable tobacco products for at least 6 hour prior to surgery   ____  4. Bring all medications with you on the day of surgery if instructed.    __x__ 5. Notify your doctor if there is any change in your medical condition     (cold, fever, infections).    x___6. On the morning of surgery brush your teeth with toothpaste and water.  You may rinse your mouth with mouth wash if you wish.  Do not swallow any toothpaste or mouthwash.   Do not wear jewelry, make-up, hairpins, clips or nail polish.  Do not wear lotions, powders, or perfumes.  You may wear deodorant.  Do not shave 48 hours prior to surgery. Men may shave face and neck.  Do not bring valuables to the hospital.    [facility] is not responsible for any belongings or valuables.               Contacts, dentures or bridgework may not be worn into surgery.  Leave your suitcase in the car. After surgery it may be brought to your room.  For patients admitted to the hospital, discharge time is determined by your treatment team.  _  Patients discharged the day of surgery will not be allowed to drive home.  You will need someone to drive you home and stay with you the night of your procedure.    Please read over the following fact sheets that you were given:   [facility] Preparing for Surgery   _x___ TAKE THE FOLLOWING MEDICATION THE MORNING OF SURGERY WITH A SMALL SIP OF WATER. These include:  1. AMLODIPINE (NORVASC)  2. LIPITOR (ATORVASTATIN)  3. YOU MAY TAKE HYDROCODONE OR GABAPENTIN IF NEEDED DAY OF SURGERY  4.  5.  6.  ____Fleets enema or Magnesium Citrate as directed.   _x___ Use CHG Soap or sage wipes as directed on instruction sheet   _X___ Use inhalers on the day of surgery and bring to hospital day of surgery-USE YOUR ALBUTEROL INHALER DAY OF SURGERY AND [place]  ____ Stop Metformin and Janumet 2 days prior to surgery.    ____ Take 1/2 of usual insulin dose the night before surgery and none on the morning surgery.   ____ Follow recommendations from Cardiologist, Pulmonologist or PCP regarding stopping Aspirin, Coumadin, Plavix ,Eliquis, Effient, or Pradaxa, and Pletal.  X____Stop Anti-inflammatories such as Advil, Aleve, Ibuprofen, Motrin, Naproxen, Naprosyn, Goodies powders or aspirin products NOW- OK to take Tylenol    ____ Stop supplements until after surgery.   ____ Bring C-Pap to the hospital.

## 2019-01-31 ENCOUNTER — Other Ambulatory Visit: Payer: Self-pay

## 2019-01-31 ENCOUNTER — Other Ambulatory Visit
Admission: RE | Admit: 2019-01-31 | Discharge: 2019-01-31 | Disposition: A | Discharge status: Home / Self Care | Payer: Medicare Other | Source: Ambulatory Visit | Attending: Surgery | Admitting: Surgery

## 2019-01-31 DIAGNOSIS — C189 Malignant neoplasm of colon, unspecified: Secondary | ICD-10-CM | POA: Diagnosis not present

## 2019-01-31 LAB — SARS CORONAVIRUS 2 (TAT 6-24 HRS): SARS Coronavirus 2: NEGATIVE

## 2019-02-01 MED ORDER — CEFAZOLIN SODIUM-DEXTROSE 2-4 GM/100ML-% IV SOLN
2.0000 g | INTRAVENOUS | Status: AC
  Administered 2019-02-02: 2 g via INTRAVENOUS

## 2019-02-02 ENCOUNTER — Ambulatory Visit: Payer: Medicare Other | Admitting: Anesthesiology

## 2019-02-02 ENCOUNTER — Ambulatory Visit
Admission: RE | Admit: 2019-02-02 | Discharge: 2019-02-02 | Disposition: A | Discharge status: Home / Self Care | Payer: Medicare Other | Source: Home / Self Care | Attending: Surgery | Admitting: Surgery

## 2019-02-02 ENCOUNTER — Ambulatory Visit: Payer: Medicare Other

## 2019-02-02 ENCOUNTER — Encounter
Admission: RE | Disposition: A | Discharge status: Home / Self Care | Payer: Self-pay | Source: Home / Self Care | Attending: Surgery

## 2019-02-02 ENCOUNTER — Encounter: Payer: Self-pay | Admitting: *Deleted

## 2019-02-02 ENCOUNTER — Ambulatory Visit
Admission: RE | Admit: 2019-02-02 | Discharge: 2019-02-02 | Disposition: A | Discharge status: Home / Self Care | Payer: Medicare Other | Attending: Surgery | Admitting: Surgery

## 2019-02-02 DIAGNOSIS — M1990 Unspecified osteoarthritis, unspecified site: Secondary | ICD-10-CM | POA: Insufficient documentation

## 2019-02-02 DIAGNOSIS — Z8542 Personal history of malignant neoplasm of other parts of uterus: Secondary | ICD-10-CM | POA: Insufficient documentation

## 2019-02-02 DIAGNOSIS — Z1159 Encounter for screening for other viral diseases: Secondary | ICD-10-CM | POA: Insufficient documentation

## 2019-02-02 DIAGNOSIS — C184 Malignant neoplasm of transverse colon: Secondary | ICD-10-CM

## 2019-02-02 DIAGNOSIS — F329 Major depressive disorder, single episode, unspecified: Secondary | ICD-10-CM | POA: Insufficient documentation

## 2019-02-02 DIAGNOSIS — Z6833 Body mass index (BMI) 33.0-33.9, adult: Secondary | ICD-10-CM | POA: Insufficient documentation

## 2019-02-02 DIAGNOSIS — C772 Secondary and unspecified malignant neoplasm of intra-abdominal lymph nodes: Secondary | ICD-10-CM | POA: Insufficient documentation

## 2019-02-02 DIAGNOSIS — Z853 Personal history of malignant neoplasm of breast: Secondary | ICD-10-CM | POA: Insufficient documentation

## 2019-02-02 DIAGNOSIS — Z87891 Personal history of nicotine dependence: Secondary | ICD-10-CM | POA: Insufficient documentation

## 2019-02-02 DIAGNOSIS — D490 Neoplasm of unspecified behavior of digestive system: Secondary | ICD-10-CM

## 2019-02-02 DIAGNOSIS — I10 Essential (primary) hypertension: Secondary | ICD-10-CM | POA: Insufficient documentation

## 2019-02-02 DIAGNOSIS — E7800 Pure hypercholesterolemia, unspecified: Secondary | ICD-10-CM | POA: Insufficient documentation

## 2019-02-02 DIAGNOSIS — C189 Malignant neoplasm of colon, unspecified: Secondary | ICD-10-CM | POA: Diagnosis not present

## 2019-02-02 DIAGNOSIS — E669 Obesity, unspecified: Secondary | ICD-10-CM | POA: Insufficient documentation

## 2019-02-02 DIAGNOSIS — Z9049 Acquired absence of other specified parts of digestive tract: Secondary | ICD-10-CM | POA: Insufficient documentation

## 2019-02-02 HISTORY — PX: PORTACATH PLACEMENT: SHX2246

## 2019-02-02 SURGERY — INSERTION, TUNNELED CENTRAL VENOUS DEVICE, WITH PORT
Anesthesia: General | Site: Chest | Laterality: Right

## 2019-02-02 MED ORDER — PROPOFOL 10 MG/ML IV BOLUS
INTRAVENOUS | Status: DC | PRN
  Administered 2019-02-02: 20 mg via INTRAVENOUS

## 2019-02-02 MED ORDER — FAMOTIDINE 20 MG PO TABS
ORAL_TABLET | ORAL | Status: AC
  Filled 2019-02-02: qty 1

## 2019-02-02 MED ORDER — BUPIVACAINE-EPINEPHRINE (PF) 0.25% -1:200000 IJ SOLN
INTRAMUSCULAR | Status: AC
  Filled 2019-02-02: qty 30

## 2019-02-02 MED ORDER — SODIUM CHLORIDE 0.9 % IV SOLN
INTRAVENOUS | Status: DC | PRN
  Administered 2019-02-02: 5 mL via INTRAMUSCULAR

## 2019-02-02 MED ORDER — LACTATED RINGERS IV SOLN
INTRAVENOUS | Status: DC
  Administered 2019-02-02 (×2): via INTRAVENOUS

## 2019-02-02 MED ORDER — CHLORHEXIDINE GLUCONATE CLOTH 2 % EX PADS: 6.0000 | MEDICATED_PAD | Freq: Once | CUTANEOUS | Status: DC

## 2019-02-02 MED ORDER — LIDOCAINE HCL (PF) 1 % IJ SOLN
INTRAMUSCULAR | Status: DC | PRN
  Administered 2019-02-02: 16 mL

## 2019-02-02 MED ORDER — PROPOFOL 500 MG/50ML IV EMUL
INTRAVENOUS | Status: AC
  Filled 2019-02-02: qty 50

## 2019-02-02 MED ORDER — MIDAZOLAM HCL 2 MG/2ML IJ SOLN
INTRAMUSCULAR | Status: AC
  Filled 2019-02-02: qty 2

## 2019-02-02 MED ORDER — FENTANYL CITRATE (PF) 100 MCG/2ML IJ SOLN
INTRAMUSCULAR | Status: DC | PRN
  Administered 2019-02-02: 25 ug via INTRAVENOUS

## 2019-02-02 MED ORDER — BUPIVACAINE-EPINEPHRINE (PF) 0.25% -1:200000 IJ SOLN
INTRAMUSCULAR | Status: DC | PRN
  Administered 2019-02-02: 16 mL

## 2019-02-02 MED ORDER — MIDAZOLAM HCL 2 MG/2ML IJ SOLN
INTRAMUSCULAR | Status: DC | PRN
  Administered 2019-02-02: .5 mg via INTRAVENOUS

## 2019-02-02 MED ORDER — FENTANYL CITRATE (PF) 100 MCG/2ML IJ SOLN: 25.0000 ug | INTRAMUSCULAR | Status: DC | PRN

## 2019-02-02 MED ORDER — FENTANYL CITRATE (PF) 100 MCG/2ML IJ SOLN
INTRAMUSCULAR | Status: AC
  Filled 2019-02-02: qty 2

## 2019-02-02 MED ORDER — CHLORHEXIDINE GLUCONATE CLOTH 2 % EX PADS
6.0000 | MEDICATED_PAD | Freq: Once | CUTANEOUS | Status: AC
  Administered 2019-02-02: 6 via TOPICAL

## 2019-02-02 MED ORDER — PROPOFOL 10 MG/ML IV BOLUS
INTRAVENOUS | Status: AC
  Filled 2019-02-02: qty 20

## 2019-02-02 MED ORDER — ONDANSETRON HCL 4 MG/2ML IJ SOLN: 4.0000 mg | Freq: Once | INTRAMUSCULAR | Status: DC | PRN

## 2019-02-02 MED ORDER — PROPOFOL 500 MG/50ML IV EMUL
INTRAVENOUS | Status: DC | PRN
  Administered 2019-02-02: 100 ug/kg/min via INTRAVENOUS

## 2019-02-02 MED ORDER — LIDOCAINE HCL (PF) 1 % IJ SOLN
INTRAMUSCULAR | Status: AC
  Filled 2019-02-02: qty 30

## 2019-02-02 MED ORDER — CEFAZOLIN SODIUM-DEXTROSE 2-4 GM/100ML-% IV SOLN
INTRAVENOUS | Status: AC
  Filled 2019-02-02: qty 100

## 2019-02-02 MED ORDER — FAMOTIDINE 20 MG PO TABS
20.0000 mg | ORAL_TABLET | Freq: Once | ORAL | Status: AC
  Administered 2019-02-02: 20 mg via ORAL

## 2019-02-02 MED ORDER — HEPARIN SODIUM (PORCINE) 5000 UNIT/ML IJ SOLN
INTRAMUSCULAR | Status: AC
  Filled 2019-02-02: qty 1

## 2019-02-02 MED ORDER — SEVOFLURANE IN SOLN
RESPIRATORY_TRACT | Status: AC
  Filled 2019-02-02: qty 250

## 2019-02-02 SURGICAL SUPPLY — 36 items
BAG DECANTER FOR FLEXI CONT (MISCELLANEOUS) ×2 IMPLANT
BLADE SURG SZ11 CARB STEEL (BLADE) ×2 IMPLANT
BOOT SUTURE AID YELLOW STND (SUTURE) ×2 IMPLANT
CANISTER SUCT 1200ML W/VALVE (MISCELLANEOUS) ×2 IMPLANT
CHLORAPREP W/TINT 26 (MISCELLANEOUS) ×2 IMPLANT
COVER LIGHT HANDLE STERIS (MISCELLANEOUS) ×4 IMPLANT
COVER WAND RF STERILE (DRAPES) ×2 IMPLANT
DERMABOND ADVANCED (GAUZE/BANDAGES/DRESSINGS) ×1
DERMABOND ADVANCED .7 DNX12 (GAUZE/BANDAGES/DRESSINGS) ×1 IMPLANT
DRAPE 3/4 80X56 (DRAPES) ×2 IMPLANT
DRAPE C-ARM XRAY 36X54 (DRAPES) ×4 IMPLANT
DRAPE INCISE IOBAN 66X45 STRL (DRAPES) ×2 IMPLANT
ELECT CAUTERY BLADE 6.4 (BLADE) ×2 IMPLANT
ELECT REM PT RETURN 9FT ADLT (ELECTROSURGICAL) ×2
ELECTRODE REM PT RTRN 9FT ADLT (ELECTROSURGICAL) ×1 IMPLANT
GEL ULTRASOUND 20GR AQUASONIC (MISCELLANEOUS) ×2 IMPLANT
GLOVE BIO SURGEON STRL SZ7 (GLOVE) ×2 IMPLANT
GOWN STRL REUS W/ TWL LRG LVL3 (GOWN DISPOSABLE) ×2 IMPLANT
GOWN STRL REUS W/TWL LRG LVL3 (GOWN DISPOSABLE) ×2
IV NS 500ML (IV SOLUTION) ×1
IV NS 500ML BAXH (IV SOLUTION) ×1 IMPLANT
KIT PORT POWER 8FR ISP CVUE (Port) ×2 IMPLANT
NEEDLE HYPO 22GX1.5 SAFETY (NEEDLE) ×2 IMPLANT
NS IRRIG 1000ML POUR BTL (IV SOLUTION) ×2 IMPLANT
PACK PORT-A-CATH (MISCELLANEOUS) ×2 IMPLANT
SPONGE LAP 18X18 RF (DISPOSABLE) ×2 IMPLANT
SUT MNCRL AB 4-0 PS2 18 (SUTURE) ×2 IMPLANT
SUT PROLENE 2-0 (SUTURE) ×1
SUT PROLENE 2-0 RB1 36X2 ARM (SUTURE) ×1
SUT VIC AB 3-0 SH 27 (SUTURE) ×1
SUT VIC AB 3-0 SH 27X BRD (SUTURE) ×1 IMPLANT
SUTURE PROLEN 2-0 RB1 36X2 ARM (SUTURE) ×1 IMPLANT
SYR 10ML LL (SYRINGE) ×2 IMPLANT
SYR 20ML LL LF (SYRINGE) ×2 IMPLANT
SYR 5ML LL (SYRINGE) ×2 IMPLANT
TOWEL OR 17X26 4PK STRL BLUE (TOWEL DISPOSABLE) ×2 IMPLANT

## 2019-02-02 NOTE — Anesthesia Post-op Follow-up Note (Signed)
Anesthesia QCDR form completed.        

## 2019-02-02 NOTE — Interval H&P Note (Signed)
History and Physical Interval Note:  02/02/2019 8:18 AM Addend  HEre for port placement ( Colon ca). Porceudre d/w the pt, risks, benefits and possible complications. She agrees w  The plan. PE: NAD, alert Chest: CTA, s1,s2 no murmurs Abd: soft, healed incisions, no infection Ext: no edema  Carrie Alvarez  has presented today for surgery, with the diagnosis of C18.4 COLON CANCER.  The various methods of treatment have been discussed with the patient and family. After consideration of risks, benefits and other options for treatment, the patient has consented to  Procedure(s): INSERTION PORT-A-CATH (N/A) as a surgical intervention.  The patient's history has been reviewed, patient examined, no change in status, stable for surgery.  I have reviewed the patient's chart and labs.  Questions were answered to the patient's satisfaction.     [place]

## 2019-02-02 NOTE — Transfer of Care (Signed)
Immediate Anesthesia Transfer of Care Note  Patient: Norm Salt  Procedure(s) Performed: INSERTION PORT-A-CATH (Right Chest)  Patient Location: PACU  Anesthesia Type:General  Level of Consciousness: sedated  Airway & Oxygen Therapy: Patient Spontanous Breathing and Patient connected to nasal cannula oxygen  Post-op Assessment: Report given to RN and Post -op Vital signs reviewed and stable  Post vital signs: Reviewed and stable  Last Vitals:  Vitals Value Taken Time  BP 115/101 02/02/19 0950  Temp    Pulse 65 02/02/19 0950  Resp 13 02/02/19 0950  SpO2 100 % 02/02/19 0950  Vitals shown include unvalidated device data.  Last Pain:  Vitals:   02/02/19 0729  TempSrc: Tympanic  PainSc: 0-No pain         Complications: No apparent anesthesia complications

## 2019-02-02 NOTE — Op Note (Signed)
  Pre-operative Diagnosis: Colon Ca  Post-operative Diagnosis: same   Surgeon: Caroleen Hamman, MD FACS  Anesthesia: IV sedation, marcaine .25% w epi and lidocaine 1%  Procedure: right IJ  Port placement with fluoroscopy under U/S guidance  Findings: Good position of the tip of the catheter by fluoroscopy  Estimated Blood Loss: Minimal         Drains: None         Specimens: None       Complications: none            Procedure Details  The patient was seen again in the Holding Room. The benefits, complications, treatment options, and expected outcomes were discussed with the patient. The risks of bleeding, infection, recurrence of symptoms, failure to resolve symptoms,  thrombosis nonfunction breakage pneumothorax hemopneumothorax any of which could require chest tube or further surgery were reviewed with the patient.   The patient was taken to Operating Room, identified as Carrie Alvarez and the procedure verified.  A Time Out was held and the above information confirmed.  Prior to the induction of general anesthesia, antibiotic prophylaxis was administered. VTE prophylaxis was in place. Appropriate anesthesia was then administered and tolerated well. The chest was prepped with Chloraprep and draped in the sterile fashion. The patient was positioned in the supine position. Then the patient was placed in Trendelenburg position.  Patient was prepped and draped in sterile fashion and in a Trendelenburg position local anesthetic was infiltrated into the skin and subcutaneous tissues in the neck and anterior chest wall. The large bore needle was placed into the internal jugular vein under U/S guidance without difficulty and then the Seldinger wire was advanced. Fluoroscopy was utilized to confirm that the Seldinger wire was in the superior vena cava.  An incision was made and a port pocket developed with blunt and electrocautery dissection. The introducer dilator was placed over the  Seldinger wire the wire was removed. The previously flushed catheter was placed into the introducer dilator and the peel-away sheath was removed. The catheter length was confirmed and trimmed utilizing fluoroscopy for proper positioning. The catheter was then attached to the previously flushed port. The port was placed into the pocket. The port was held in with 2-0 Prolenes and flushed for function and heparin locked.  The wound was closed with interrupted 3-0 Vicryl followed by 4-0 subcuticular Monocryl sutures. Dermabond used to coat the skin  Patient was taken to the recovery room in stable condition where a postoperative chest film has been ordered.

## 2019-02-02 NOTE — Anesthesia Procedure Notes (Signed)
Date/Time: 02/02/2019 8:40 AM Performed by: Allean Found, CRNA Pre-anesthesia Checklist: Patient identified, Emergency Drugs available, Suction available, Patient being monitored and Timeout performed Oxygen Delivery Method: Nasal cannula Placement Confirmation: positive ETCO2

## 2019-02-02 NOTE — Anesthesia Postprocedure Evaluation (Signed)
Anesthesia Post Note  Patient: Carrie Alvarez  Procedure(s) Performed: INSERTION PORT-A-CATH (Right Chest)  Patient location during evaluation: PACU Anesthesia Type: General Level of consciousness: awake and alert and oriented Pain management: pain level controlled Vital Signs Assessment: post-procedure vital signs reviewed and stable Respiratory status: spontaneous breathing, nonlabored ventilation and respiratory function stable Cardiovascular status: blood pressure returned to baseline and stable Postop Assessment: no signs of nausea or vomiting Anesthetic complications: no     Last Vitals:  Vitals:   02/02/19 1030 02/02/19 1116  BP: (!) 144/94 128/90  Pulse: 65 66  Resp: 18 20  Temp: (!) 36.1 C   SpO2: 100% 100%    Last Pain:  Vitals:   02/02/19 1030  TempSrc: Temporal  PainSc: 0-No pain                 Tiffaney Heimann

## 2019-02-02 NOTE — Discharge Instructions (Addendum)
AMBULATORY SURGERY  °DISCHARGE INSTRUCTIONS ° ° °1) The drugs that you were given will stay in your system until tomorrow so for the next 24 hours you should not: ° °A) Drive an automobile °B) Make any legal decisions °C) Drink any alcoholic beverage ° ° °2) You may resume regular meals tomorrow.  Today it is better to start with liquids and gradually work up to solid foods. ° °You may eat anything you prefer, but it is better to start with liquids, then soup and crackers, and gradually work up to solid foods. ° ° °3) Please notify your doctor immediately if you have any unusual bleeding, trouble breathing, redness and pain at the surgery site, drainage, fever, or pain not relieved by medication. ° ° ° °4) Additional Instructions: ° ° ° ° ° ° ° °Please contact your physician with any problems or Same Day Surgery at [phone], Monday through Friday 6 am to 4 pm, or [place] [facility] [place] Main number at [phone]. °

## 2019-02-02 NOTE — Anesthesia Preprocedure Evaluation (Signed)
Anesthesia Evaluation  Patient identified by MRN, date of birth, ID band Patient awake    Reviewed: Allergy & Precautions, NPO status , Patient's Chart, lab work & pertinent test results  History of Anesthesia Complications Negative for: history of anesthetic complications  Airway Mallampati: II  TM Distance: >3 FB Neck ROM: Full    Dental no notable dental hx.    Pulmonary neg sleep apnea, neg COPD, former smoker,    breath sounds clear to auscultation- rhonchi (-) wheezing      Cardiovascular hypertension, Pt. on medications (-) CAD, (-) Past MI, (-) Cardiac Stents and (-) CABG  Rhythm:Regular Rate:Normal - Systolic murmurs and - Diastolic murmurs    Neuro/Psych neg Seizures PSYCHIATRIC DISORDERS Depression negative neurological ROS     GI/Hepatic Neg liver ROS, GERD  ,  Endo/Other  negative endocrine ROSneg diabetes  Renal/GU negative Renal ROS     Musculoskeletal  (+) Arthritis ,   Abdominal (+) + obese,   Peds  Hematology  (+) anemia ,   Anesthesia Other Findings Past Medical History: No date: Allergy No date: Anemia No date: Back pain 2015: Breast cancer ([place])     Comment:  LT LUMPECTOMY 12-2014 FOLLOWING CHEMO No date: Carpal tunnel syndrome     Comment:  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 No date: Cataract 2020: Colon cancer ([facility]) No date: Depression No date: GERD (gastroesophageal reflux disease)     Comment:  problem with reflux during chemo No date: History of methicillin resistant staphylococcus aureus (MRSA) No date: Hypercholesteremia No date: Hypertension No date: Lumbosacral pain     Comment:  sees pain management in [place] No date: Obesity 2016: Personal history of chemotherapy No date: Pinched nerve     Comment:  left side, lumbar area 1994: Uterine cancer ([place])   Reproductive/Obstetrics                             Anesthesia Physical Anesthesia  Plan  ASA: III  Anesthesia Plan: General   Post-op Pain Management:    Induction: Intravenous  PONV Risk Score and Plan: 2 and Propofol infusion  Airway Management Planned: Natural Airway  Additional Equipment:   Intra-op Plan:   Post-operative Plan:   Informed Consent: I have reviewed the patients History and Physical, chart, labs and discussed the procedure including the risks, benefits and alternatives for the proposed anesthesia with the patient or authorized representative who has indicated his/her understanding and acceptance.     Dental advisory given  Plan Discussed with: CRNA and Anesthesiologist  Anesthesia Plan Comments:         Anesthesia Quick Evaluation

## 2019-02-07 ENCOUNTER — Encounter: Payer: Self-pay | Admitting: *Deleted

## 2019-02-07 ENCOUNTER — Other Ambulatory Visit: Payer: Self-pay

## 2019-02-07 ENCOUNTER — Inpatient Hospital Stay: Payer: Medicare Other

## 2019-02-07 ENCOUNTER — Ambulatory Visit
Admission: RE | Admit: 2019-02-07 | Discharge: 2019-02-07 | Disposition: A | Discharge status: Home / Self Care | Payer: Medicare Other | Source: Ambulatory Visit | Attending: Oncology | Admitting: Oncology

## 2019-02-07 DIAGNOSIS — C184 Malignant neoplasm of transverse colon: Secondary | ICD-10-CM | POA: Diagnosis present

## 2019-02-07 DIAGNOSIS — C189 Malignant neoplasm of colon, unspecified: Secondary | ICD-10-CM | POA: Diagnosis not present

## 2019-02-07 LAB — COMPREHENSIVE METABOLIC PANEL
ALT: 16 U/L (ref 0–44)
AST: 23 U/L (ref 15–41)
Albumin: 3.9 g/dL (ref 3.5–5.0)
Alkaline Phosphatase: 74 U/L (ref 38–126)
Anion gap: 10 (ref 5–15)
BUN: 10 mg/dL (ref 8–23)
CO2: 25 mmol/L (ref 22–32)
Calcium: 9.5 mg/dL (ref 8.9–10.3)
Chloride: 108 mmol/L (ref 98–111)
Creatinine, Ser: 1.11 mg/dL — ABNORMAL HIGH (ref 0.44–1.00)
GFR calc Af Amer: 59 mL/min — ABNORMAL LOW (ref >60)
GFR calc non Af Amer: 51 mL/min — ABNORMAL LOW (ref >60)
Glucose, Bld: 95 mg/dL (ref 70–99)
Potassium: 3.9 mmol/L (ref 3.5–5.1)
Sodium: 143 mmol/L (ref 135–145)
Total Bilirubin: 0.6 mg/dL (ref 0.3–1.2)
Total Protein: 7.4 g/dL (ref 6.5–8.1)

## 2019-02-07 LAB — URINALYSIS, COMPLETE (UACMP) WITH MICROSCOPIC
Bacteria, UA: NONE SEEN
Bilirubin Urine: NEGATIVE
Glucose, UA: NEGATIVE mg/dL
Hgb urine dipstick: NEGATIVE
Ketones, ur: NEGATIVE mg/dL
Nitrite: NEGATIVE
Protein, ur: NEGATIVE mg/dL
Specific Gravity, Urine: >1.046 — ABNORMAL HIGH (ref 1.005–1.030)
pH: 5 (ref 5.0–8.0)

## 2019-02-07 LAB — CBC WITH DIFFERENTIAL/PLATELET
Abs Immature Granulocytes: 0.04 10*3/uL (ref 0.00–0.07)
Basophils Absolute: 0 10*3/uL (ref 0.0–0.1)
Basophils Relative: 0 %
Eosinophils Absolute: 0.2 10*3/uL (ref 0.0–0.5)
Eosinophils Relative: 3 %
HCT: 32.4 % — ABNORMAL LOW (ref 36.0–46.0)
Hemoglobin: 11 g/dL — ABNORMAL LOW (ref 12.0–15.0)
Immature Granulocytes: 1 %
Lymphocytes Relative: 21 %
Lymphs Abs: 1.4 10*3/uL (ref 0.7–4.0)
MCH: 28 pg (ref 26.0–34.0)
MCHC: 34 g/dL (ref 30.0–36.0)
MCV: 82.4 fL (ref 80.0–100.0)
Monocytes Absolute: 0.5 10*3/uL (ref 0.1–1.0)
Monocytes Relative: 8 %
Neutro Abs: 4.5 10*3/uL (ref 1.7–7.7)
Neutrophils Relative %: 67 %
Platelets: 230 10*3/uL (ref 150–400)
RBC: 3.93 MIL/uL (ref 3.87–5.11)
RDW: 16 % — ABNORMAL HIGH (ref 11.5–15.5)
WBC: 6.7 10*3/uL (ref 4.0–10.5)
nRBC: 0 % (ref 0.0–0.2)

## 2019-02-07 LAB — TSH: TSH: 1.55 u[IU]/mL (ref 0.350–4.500)

## 2019-02-07 MED ORDER — IOHEXOL 300 MG/ML  SOLN
75.0000 mL | Freq: Once | INTRAMUSCULAR | Status: AC | PRN
  Administered 2019-02-07: 75 mL via INTRAVENOUS

## 2019-02-07 NOTE — Research (Signed)
Patient Carrie Alvarez presented to clinic this morning as instructed to have study eligibility labs collected. Patient is fasting and will go from here over the have her chest CT performed. States she is doing well and endurance improving slowly from her colectomy. All scheduled labs were collected except for her urinalysis. Patient was unable to void. She was given the urine specimen cup and supplies for clean catch urine and instructed to bring her specimen back to the lab as soon as she is able to collect it. Patient then went over the have CT performed. She returned to the cancer center following her CT, but was still unable to void. Specimen cup sent home with her along with her kit to collect a stool specimen. Instructed to have a bowel movement into the hat, then using the tongue depressor, divide the stool into 3 plastic cups and put them in a plastic bag, then inside another and place them in her freezer until she comes in on Monday morning for her treatment. Patient verbalizes understanding of these instructions and written instructions also provided. Husband was in attendance for this discussion as well. [name], BSN, MHA, OCN 02/07/2019 10:20 AM  Received t/c from patient stating she has finally been able to void and that her daughter is bringing her urine specimen to the clinic now. Instructed her to call me when she arrives and Lula Olszewski will pick up the specimen curbside and take it over to the lab. Patient agreed. [name], BSN, MHA, OCN 02/07/2019 2:25 PM   Urine specimen retrieved by Lula Olszewski and taken to he lab. She also gave patient a $10 gas card to help offset the cost of a second trip to the cancer center today. [name], BSN, MHA, OCN 02/07/2019 3:05 PM  All eligibility criteria have now been met and patient meets all of the eligibility and none of the ineligibility criteria. Second review for eligibility performed by Jeral Fruit, RN and she and Dr.  Grayland Ormond agree that the patient is eligible to enroll in the [name] study. Patient was randomized in the OPEN system to ARM 1 of the study; to receive [id number] + atezilizumab every 14 days x 12 cycles, then atezilizumab alone for an additional 13 cycles. Study drug was ordered from [facility]. Dr. Grayland Ormond was notified of patient's randomization status. He states have patient stop taking Letrozole today per guidance received from study chair Dr. Kathrene Bongo, since it is an ongoing treatment for her breast cancer that was removed in 2016. Patient called and informed of her treatment assignment on the study, and to stop taking the letrozole today. Patient voices understanding of instructions and states she will stop taking the letrozole. Reminded patient of her infusion appointment on Monday morning at 8:00 AM and patient states she will see me then. [name], BSN, MHA, OCN 02/07/2019 5:19 PM

## 2019-02-08 LAB — CEA: CEA: 3.3 ng/mL (ref 0.0–4.7)

## 2019-02-09 ENCOUNTER — Other Ambulatory Visit: Payer: Self-pay

## 2019-02-09 DIAGNOSIS — C184 Malignant neoplasm of transverse colon: Secondary | ICD-10-CM

## 2019-02-10 ENCOUNTER — Other Ambulatory Visit: Payer: Self-pay

## 2019-02-10 ENCOUNTER — Other Ambulatory Visit: Payer: Self-pay | Admitting: Oncology

## 2019-02-10 DIAGNOSIS — C184 Malignant neoplasm of transverse colon: Secondary | ICD-10-CM

## 2019-02-10 MED ORDER — LOPERAMIDE HCL 2 MG PO CAPS: ORAL_CAPSULE | ORAL | 1 refills | Status: DC

## 2019-02-10 MED ORDER — PROCHLORPERAZINE MALEATE 10 MG PO TABS: 10.0000 mg | ORAL_TABLET | Freq: Four times a day (QID) | ORAL | 2 refills | Status: DC | PRN

## 2019-02-10 MED ORDER — ONDANSETRON HCL 8 MG PO TABS: 8.0000 mg | ORAL_TABLET | Freq: Two times a day (BID) | ORAL | 2 refills | Status: DC

## 2019-02-12 NOTE — Progress Notes (Signed)
[place]  [phone] [phone]  ID: Norm Salt OB: May 13, 1951  MR#: [id number]  KDX#:[id number]  Patient Care Team: Paulene Floor as PCP - General (Physician Assistant) [name], MD as Consulting Physician (Oncology) [name], MD as Attending Physician (Pain Medicine) [name]., MD as Consulting Physician (Ophthalmology) [name], RN as Oncology Nurse Navigator  CHIEF COMPLAINT: Stage IIa ER+ left breast cancer with no breast lesion and axillary lymph node metastasis.  Now with stage IIIa colon cancer.  INTERVAL HISTORY: Patient returns to clinic today for further evaluation and initiation of cycle 1 of FOLFOX plus Tecentriq on clinical trial.  She currently feels well is asymptomatic. She continues to have a mild peripheral neuropathy, but no other neurologic complaints.  She has a good appetite and denies weight loss.  She denies any recent fevers or illnesses.  She has no chest pain, shortness of breath, cough, or hemoptysis.  She denies any nausea, vomiting, constipation, or diarrhea.  She has no melena or hematochezia.  She has no urinary complaints.  Patient offers no specific complaints today.  REVIEW OF SYSTEMS:   Review of Systems  Constitutional: Negative.  Negative for fever, malaise/fatigue and weight loss.  Respiratory: Negative.  Negative for cough and shortness of breath.   Cardiovascular: Negative.  Negative for chest pain and leg swelling.  Gastrointestinal: Negative.  Negative for abdominal pain, blood in stool, constipation, diarrhea, melena, nausea and vomiting.  Genitourinary: Negative.  Negative for dysuria.  Musculoskeletal: Negative.  Negative for back pain.  Skin: Negative.  Negative for rash.  Neurological: Positive for sensory change. Negative for dizziness, focal weakness, weakness and headaches.  Psychiatric/Behavioral: Negative.  Negative for depression. The  patient is not nervous/anxious.     As per HPI. Otherwise, a complete review of systems is negative.  PAST MEDICAL HISTORY: Past Medical History:  Diagnosis Date  . Allergy   . Anemia   . Back pain   . Breast cancer ([place]) 2015   LT LUMPECTOMY 12-2014 FOLLOWING CHEMO  . Carpal tunnel syndrome    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  . Cataract   . Colon cancer ([place]) 2020  . Depression   . GERD (gastroesophageal reflux disease)    problem with reflux during chemo  . History of methicillin resistant staphylococcus aureus (MRSA)   . Hypercholesteremia   . Hypertension   . Lumbosacral pain    sees pain management in [place]  . Obesity   . Personal history of chemotherapy 2016  . Pinched nerve    left side, lumbar area  . Uterine cancer ([place]) 1994    PAST SURGICAL HISTORY: Past Surgical History:  Procedure Laterality Date  . ABDOMINAL HYSTERECTOMY  1994   UTERINE CA  . AXILLARY LYMPH NODE BIOPSY Left 12/18/2014   Procedure: AXILLARY LYMPH NODE BIOPSY/;  Surgeon: Robert Bellow, MD;  Location: [facility];  Service: General;  Laterality: Left;  . AXILLARY LYMPH NODE DISSECTION Left 12/18/2014   Procedure: AXILLARY LYMPH NODE DISSECTION;  Surgeon: Robert Bellow, MD;  Location: [facility];  Service: General;  Laterality: Left;  . BREAST BIOPSY Left 05/2014   CORE BX OF LN, METASTATIC ADENOCARCINOMA  . BREAST LUMPECTOMY Left 12/2014   CHEMO FIRST THEN SURGERY OF LN  . BREAST SURGERY     lymph node removal  . CHOLECYSTECTOMY N/A 04/19/2016   Procedure: LAPAROSCOPIC CHOLECYSTECTOMY WITH INTRAOPERATIVE CHOLANGIOGRAM;  Surgeon: Hubbard Robinson, MD;  Location:  [facility];  Service: General;  Laterality: N/A;  . COLON RESECTION Right 01/10/2019   Procedure: HAND ASSISTED LAPAROSCOPIC RIGHT COLON RESECTION;  Surgeon: Jules Husbands, MD;  Location: [facility];  Service: General;  Laterality: Right;  . COLONOSCOPY WITH PROPOFOL N/A 12/30/2018   Procedure: COLONOSCOPY WITH PROPOFOL;   Surgeon: Lucilla Lame, MD;  Location: [facility];  Service: Endoscopy;  Laterality: N/A;  . medial branch block  11/06/2015   lumbar facet Dr. Primus Bravo  . POLYPECTOMY N/A 12/30/2018   Procedure: POLYPECTOMY;  Surgeon: Lucilla Lame, MD;  Location: [facility];  Service: Endoscopy;  Laterality: N/A;  Clips placed at Hepatic Flexure Polyp (2) and Transverse Colon Polyp (4) removal sites  . PORTACATH PLACEMENT Right 02/02/2019   Procedure: INSERTION PORT-A-CATH;  Surgeon: Jules Husbands, MD;  Location: [facility];  Service: General;  Laterality: Right;  . SENTINEL NODE BIOPSY Left 12/18/2014   Procedure: SENTINEL NODE BIOPSY;  Surgeon: Robert Bellow, MD;  Location: [facility];  Service: General;  Laterality: Left;    FAMILY HISTORY Family History  Problem Relation Age of Onset  . Breast cancer Sister 63  . Colon cancer Sister   . Colon cancer Mother   . Hypertension Mother   . Asthma Mother   . Cancer Father        ADVANCED DIRECTIVES:    HEALTH MAINTENANCE: Social History   Tobacco Use  . Smoking status: Former Smoker    Packs/day: 0.50    Types: Cigarettes    Quit date: 07/18/2014    Years since quitting: 4.5  . Smokeless tobacco: Never Used  Substance Use Topics  . Alcohol use: No    Alcohol/week: 0.0 standard drinks  . Drug use: No    No Known Allergies  Current Outpatient Medications  Medication Sig Dispense Refill  . acetaminophen (TYLENOL) 500 MG tablet Take 500 mg by mouth every 6 (six) hours as needed for mild pain or moderate pain.     Marland Kitchen albuterol (PROVENTIL HFA;VENTOLIN HFA) 108 (90 Base) MCG/ACT inhaler Inhale 2 puffs into the lungs every 6 (six) hours as needed for wheezing or shortness of breath. 1 Inhaler 2  . amLODipine (NORVASC) 10 MG tablet Take 1 tablet (10 mg total) by mouth daily. (Patient taking differently: Take 10 mg by mouth every morning. ) 90 tablet 0  . atorvastatin (LIPITOR) 10 MG tablet Take 1 tablet (10 mg total) by mouth daily.  (Patient taking differently: Take 10 mg by mouth every morning. ) 90 tablet 0  . baclofen (LIORESAL) 10 MG tablet Take 1 tablet (10 mg total) by mouth 2 (two) times daily. (Patient taking differently: Take 10 mg by mouth 3 (three) times daily as needed for muscle spasms. ) 30 each 0  . bisacodyl (BISACODYL) 5 MG EC tablet Take 4 tablets (5 mg each) at 8 am the day before surgery. 4 tablet 0  . cholecalciferol (VITAMIN D) 1000 units tablet Take 1,000 Units by mouth daily.    . fluticasone (FLONASE) 50 MCG/ACT nasal spray USE 2 SPRAY(S) IN EACH NOSTRIL AS NEEDED FOR ALLERGIES OR RHINITIS. (Patient taking differently: Place 2 sprays into both nostrils daily as needed for allergies or rhinitis. ) 16 g 0  . gabapentin (NEURONTIN) 300 MG capsule Take 300-600 mg by mouth 4 (four) times daily as needed (neuropathic pain).     Marland Kitchen HYDROcodone-acetaminophen (NORCO/VICODIN) 5-325 MG tablet Take 1 tablet by mouth every 6 (six) hours as needed for moderate pain. 30 tablet  0  . lidocaine-prilocaine (EMLA) cream Apply to affected area once 30 g 3  . loperamide (IMODIUM) 2 MG capsule Take 8m at onset of diarrhea, then 239mq2h ATC until without diarrhea x 12 hrs. May take 52m3m4h at HS. Max dose 39m61my. 100 capsule 1  . ondansetron (ZOFRAN ODT) 4 MG disintegrating tablet Take 1 tablet (4 mg total) by mouth every 8 (eight) hours as needed for nausea or vomiting. 20 tablet 0  . ondansetron (ZOFRAN) 8 MG tablet Take 1 tablet (8 mg total) by mouth 2 (two) times daily. Start the day after chemo for 2 days. Then take as needed for nausea or vomiting. 30 tablet 2  . pyridoxine (B-6) 100 MG tablet Take 100 mg by mouth daily.    . sertraline (ZOLOFT) 100 MG tablet Take 2 tablets (200 mg total) by mouth daily. (Patient taking differently: Take 200 mg by mouth at bedtime. ) 180 tablet 0  . vitamin B-12 (CYANOCOBALAMIN) 500 MCG tablet Take 500 mcg by mouth daily.    . leMarland Kitchenrozole (FEMARA) 2.5 MG tablet Take 1 tablet (2.5 mg total)  by mouth daily. (Patient not taking: Reported on 02/13/2019) 90 tablet 3   No current facility-administered medications for this visit.    Facility-Administered Medications Ordered in Other Visits  Medication Dose Route Frequency Provider Last Rate Last Dose  . fluorouracil ALLIANCE [id number]) 4,700 mg in sodium chloride 0.9 % 56 mL chemo infusion  2,400 mg/m2 (Treatment Plan Recorded) Intravenous 1 day or 1 dose FinnLloyd Huger      . fluorouracil ALLIANCE [id number]) chemo injection 800 mg  400 mg/m2 (Treatment Plan Recorded) Intravenous Once FinnLloyd Huger      . heparin lock flush 100 unit/mL  500 Units Intravenous Once FinnLloyd Huger      . leucovorin ALLIANCE [id number] mg in dextrose 5 % 250 mL infusion  800 mg Intravenous Once FinnLloyd Huger   800 mg at 02/13/19 1156  . oxaliplatin ALLIANCE [id number] mg in dextrose 5 % 500 mL chemo infusion  165 mg Intravenous Once FinnLloyd Huger   165 mg at 02/13/19 1157  . sodium chloride flush (NS) 0.9 % injection 10 mL  10 mL Intravenous PRN FinnLloyd Huger   10 mL at 02/13/19 0824    OBJECTIVE: Vitals:   02/13/19 0837  BP: (!) 113/91  Pulse: 80  Temp: (!) 97.1 F (36.2 C)  SpO2: 100%     Body mass index is 33.39 kg/m.    ECOG FS:1 - Symptomatic but completely ambulatory  General: Well-developed, well-nourished, no acute distress. Eyes: Pink conjunctiva, anicteric sclera. HEENT: Normocephalic, moist mucous membranes. Lungs: Clear to auscultation bilaterally. Heart: Regular rate and rhythm. No rubs, murmurs, or gallops. Abdomen: Soft, nontender, nondistended. No organomegaly noted, normoactive bowel sounds. Musculoskeletal: No edema, cyanosis, or clubbing. Neuro: Alert, answering all questions appropriately. Cranial nerves grossly intact. Skin: No rashes or petechiae noted. Psych: Normal affect.  LAB RESULTS:  Lab Results  Component Value Date   NA 143 02/07/2019   K  3.9 02/07/2019   CL 108 02/07/2019   CO2 25 02/07/2019   GLUCOSE 95 02/07/2019   BUN 10 02/07/2019   CREATININE 1.11 (H) 02/07/2019   CALCIUM 9.5 02/07/2019   PROT 7.4 02/07/2019   ALBUMIN 3.9 02/07/2019   AST 23 02/07/2019   ALT 16 02/07/2019   ALKPHOS 74 02/07/2019   BILITOT 0.6 02/07/2019  GFRNONAA 51 (L) 02/07/2019   GFRAA 59 (L) 02/07/2019    Lab Results  Component Value Date   WBC 6.7 02/07/2019   NEUTROABS 4.5 02/07/2019   HGB 11.0 (L) 02/07/2019   HCT 32.4 (L) 02/07/2019   MCV 82.4 02/07/2019   PLT 230 02/07/2019     STUDIES: Dg Chest 1 View  Result Date: 02/02/2019 CLINICAL DATA:  Port-A-Cath insertion EXAM: CHEST  1 VIEW COMPARISON:  12/28/2016 FINDINGS: Right Port-A-Cath is in place with the tip in the SVC. No pneumothorax. Heart and mediastinal contours are within normal limits. No focal opacities or effusions. No acute bony abnormality. IMPRESSION: Right Port-A-Cath tip in the SVC. No pneumothorax. No active cardiopulmonary disease. Electronically Signed   By: Rolm Baptise M.D.   On: 02/02/2019 10:37   Ct Chest W Contrast  Result Date: 02/07/2019 CLINICAL DATA:  Newly diagnosed colon carcinoma. Staging. EXAM: CT CHEST WITH CONTRAST TECHNIQUE: Multidetector CT imaging of the chest was performed during intravenous contrast administration. CONTRAST:  58m OMNIPAQUE IOHEXOL 300 MG/ML  SOLN COMPARISON:  06/04/2014 FINDINGS: Cardiovascular: No acute findings. Aortic atherosclerosis. 4.1 cm ascending thoracic aortic aneurysm noted. Mediastinum/Nodes: No masses or pathologically enlarged lymph nodes identified. Lungs/Pleura: No suspicious pulmonary nodules or masses are identified. No evidence of pulmonary infiltrate or pleural effusion. Upper Abdomen:  Unremarkable. Musculoskeletal:  No suspicious bone lesions. IMPRESSION: 1. No evidence of thoracic metastatic disease or other acute findings. 2. 4.1 cm ascending thoracic aortic aneurysm. Recommend annual imaging followup by  CTA or MRA. This recommendation follows 2010 ACCF/AHA/AATS/ACR/ASA/SCA/SCAI/SIR/STS/SVM Guidelines for the Diagnosis and Management of Patients with Thoracic Aortic Disease. Circulation. 2010; 121:: [id number] Aortic aneurysm NOS (ICD10-I71.9) Aortic Atherosclerosis (ICD10-I70.0). Electronically Signed   By: JMarlaine HindM.D.   On: 02/07/2019 11:32   Dg C-arm 1-60 Min-no Report  Result Date: 02/02/2019 Fluoroscopy was utilized by the requesting physician.  No radiographic interpretation.    ASSESSMENT: Stage IIa ER+ left breast cancer with no breast lesion and axillary lymph node metastasis. (TxN1M0)  HER-2 negative.  Now with stage IIIa colon cancer.  PLAN:   1.  Stage IIIa colon cancer: Pathology report reviewed independently.  Patient also had a CT scan of her abdomen on January 02, 2019 that did not reveal metastatic disease.  CT scan of the chest on February 07, 2019 also was negative for metastatic disease.  Patient is agreed to enroll in clinical trial and will initiate cycle 1 of 12 of FOLFOX plus Tecentriq today.  Return to clinic in 2 days for pump removal, in 1 week for laboratory work and video enabled telemedicine visit, and then in 2 weeks for further evaluation and consideration of cycle 2.   2. Stage IIa ER+ left breast cancer with no breast lesion and axillary lymph node metastasis: Despite no obvious breast lesion on mammogram or breast MRI, pathology and pattern of spread was consistent with primary breast cancer. CT and bone scan at time of diagnosis revealed no other evidence of malignancy.  She only received 3 cycles of Adriamycin and Cytoxan.  Taxol was discontinued early as well secondary to persistent peripheral neuropathy. Patient completed her chemotherapy on Nov 19, 2014.  She elected not to pursue axillary node dissection given the potential morbidity of this procedure. It was also elected not to pursue adjuvant XRT given there was no primary breast lesion.  Patient has had  approximately 4 years of treatment with letrozole, but this has been discontinued secondary to enrolling in clinical trial for her  newly diagnosed colon cancer.  Her most recent mammogram on June 29, 2018 was reported as BI-RADS 2.  Repeat in December 2020.  Continue to monitor throughout treatment for colon cancer. 3.  Bone health: Patient's most recent bone mineral density on February 03, 2018 reported T score of -0.7 which is unchanged from 3 years prior.  Consider repeat bone mineral density in July 2021.  4.  Family history: Patient's sister reports she has positive for Lynch syndrome, but these results were never confirmed.  Patient will require genetic testing in the future. 5. Pain/Peripheral neuropathy: Chronic and unchanged.  Monitor closely while receiving oxaliplatin 6. Hot flashes: Patient does not complain of this today.  Letrozole has been discontinued.   Patient expressed understanding and was in agreement with this plan. She also understands that She can call clinic at any time with any questions, concerns, or complaints.   Breast cancer metastasized to axillary lymph node   Staging form: Breast, AJCC 7th Edition     Clinical stage from 10/22/2014: Stage Unknown (TX, N1, M0) - Signed by [name], MD on 10/22/2014   [name], MD   02/13/2019 12:38 PM

## 2019-02-13 ENCOUNTER — Encounter: Payer: Self-pay | Admitting: *Deleted

## 2019-02-13 ENCOUNTER — Inpatient Hospital Stay: Payer: Medicare Other | Attending: Oncology

## 2019-02-13 ENCOUNTER — Inpatient Hospital Stay (HOSPITAL_BASED_OUTPATIENT_CLINIC_OR_DEPARTMENT_OTHER): Payer: Medicare Other | Admitting: Oncology

## 2019-02-13 ENCOUNTER — Inpatient Hospital Stay: Payer: Medicare Other

## 2019-02-13 ENCOUNTER — Other Ambulatory Visit: Payer: Self-pay

## 2019-02-13 VITALS — BP 125/86 | HR 65 | Temp 96.6°F | Resp 18

## 2019-02-13 VITALS — BP 113/91 | HR 80 | Temp 97.1°F | Ht 63.5 in | Wt 191.5 lb

## 2019-02-13 DIAGNOSIS — C184 Malignant neoplasm of transverse colon: Secondary | ICD-10-CM

## 2019-02-13 DIAGNOSIS — N289 Disorder of kidney and ureter, unspecified: Secondary | ICD-10-CM | POA: Insufficient documentation

## 2019-02-13 DIAGNOSIS — Z9221 Personal history of antineoplastic chemotherapy: Secondary | ICD-10-CM | POA: Diagnosis not present

## 2019-02-13 DIAGNOSIS — D649 Anemia, unspecified: Secondary | ICD-10-CM | POA: Insufficient documentation

## 2019-02-13 DIAGNOSIS — Z853 Personal history of malignant neoplasm of breast: Secondary | ICD-10-CM | POA: Diagnosis not present

## 2019-02-13 DIAGNOSIS — T451X5A Adverse effect of antineoplastic and immunosuppressive drugs, initial encounter: Secondary | ICD-10-CM | POA: Insufficient documentation

## 2019-02-13 DIAGNOSIS — C189 Malignant neoplasm of colon, unspecified: Secondary | ICD-10-CM | POA: Diagnosis present

## 2019-02-13 DIAGNOSIS — G620 Drug-induced polyneuropathy: Secondary | ICD-10-CM | POA: Diagnosis not present

## 2019-02-13 DIAGNOSIS — E876 Hypokalemia: Secondary | ICD-10-CM | POA: Insufficient documentation

## 2019-02-13 DIAGNOSIS — Z006 Encounter for examination for normal comparison and control in clinical research program: Secondary | ICD-10-CM | POA: Diagnosis not present

## 2019-02-13 DIAGNOSIS — Z5111 Encounter for antineoplastic chemotherapy: Secondary | ICD-10-CM | POA: Diagnosis not present

## 2019-02-13 MED ORDER — PALONOSETRON HCL INJECTION 0.25 MG/5ML
0.2500 mg | Freq: Once | INTRAVENOUS | Status: AC
  Administered 2019-02-13: 0.25 mg via INTRAVENOUS
  Filled 2019-02-13: qty 5

## 2019-02-13 MED ORDER — LEUCOVORIN 350 MG INJECTION FOR ALLIANCE A021502
800.0000 mg | Freq: Once | INTRAVENOUS | Status: AC
  Administered 2019-02-13: 800 mg via INTRAVENOUS
  Filled 2019-02-13: qty 800

## 2019-02-13 MED ORDER — DEXTROSE 5 % IV SOLN
Freq: Once | INTRAVENOUS | Status: AC
  Administered 2019-02-13: 12:00:00 via INTRAVENOUS
  Filled 2019-02-13: qty 250

## 2019-02-13 MED ORDER — SODIUM CHLORIDE 0.9 % IV SOLN
840.0000 mg | Freq: Once | INTRAVENOUS | Status: AC
  Administered 2019-02-13: 840 mg via INTRAVENOUS
  Filled 2019-02-13: qty 14

## 2019-02-13 MED ORDER — SODIUM CHLORIDE 0.9 % IV SOLN
Freq: Once | INTRAVENOUS | Status: AC
  Administered 2019-02-13: 10:00:00 via INTRAVENOUS
  Filled 2019-02-13: qty 250

## 2019-02-13 MED ORDER — SODIUM CHLORIDE 0.9% FLUSH
10.0000 mL | INTRAVENOUS | Status: DC | PRN
  Administered 2019-02-13: 10 mL via INTRAVENOUS
  Filled 2019-02-13: qty 10

## 2019-02-13 MED ORDER — FLUOROURACIL CHEMO INJECTION 2.5 GM/50ML ALLIANCE A021502
400.0000 mg/m2 | Freq: Once | INTRAVENOUS | Status: AC
  Administered 2019-02-13: 800 mg via INTRAVENOUS
  Filled 2019-02-13: qty 16

## 2019-02-13 MED ORDER — SODIUM CHLORIDE 0.9 % IV SOLN
2400.0000 mg/m2 | INTRAVENOUS | Status: DC
  Administered 2019-02-13: 4700 mg via INTRAVENOUS
  Filled 2019-02-13: qty 94

## 2019-02-13 MED ORDER — SODIUM CHLORIDE 0.9 % IV SOLN
Freq: Once | INTRAVENOUS | Status: AC
  Administered 2019-02-13: 11:00:00 via INTRAVENOUS
  Filled 2019-02-13: qty 5

## 2019-02-13 MED ORDER — OXALIPLATIN CHEMO INJECTION 100 MG/20ML FOR ALLIANCE A021502
165.0000 mg | Freq: Once | INTRAVENOUS | Status: AC
  Administered 2019-02-13: 165 mg via INTRAVENOUS
  Filled 2019-02-13: qty 165

## 2019-02-13 NOTE — Progress Notes (Signed)
Per Herb Grays CMA per Dr. Grayland Ormond okay to proceed with treatment with B/P 113/91.  Ulis Rias, Research RN at chairside with pt, okay to proceed with treatment and education provided by [employer] on how the treatment is to be given.   Pt tolerated treatment well. Pt stable at discharge.

## 2019-02-13 NOTE — Progress Notes (Signed)
Patient stated that she had been doing well with no complaints. 

## 2019-02-13 NOTE — Research (Signed)
Patient Carrie Alvarez presented to clinic this morning as scheduled for her C1D1 study visit and treatment with new Atezolizumab plus mFOLFOX6. Carrie Alvarez reportedly collected her stool specimen for the study yesterday and processed it at home, then placed it in her freezer and brought it with her this morning. She had her new port-a-cath accessed and study labs were collected by Meribeth Mattes, RN and part of specimens were taken to the lab for further processing, while the ambient labs were simply inverted 10+ times each and prepared to ship out today. Solicited adverse events were assessed by this RN and patient completed the baseline PRO-CTCAE and Patient questionnaire booklet while waiting to see MD. Baseline solicited AEs with grade and attribution as noted below. Height, weight and VS were assessed and SPO2 was measured at 100% this morning. Patient is currently in a wheelchair while in the clinic. She normally ambulates independently using a cane, but states she fell about 10-11 days ago and does not trust herself. She is not; however, wheelchair bound and remains independent in all of her self-care need so her ECOG PS is still 1. Dr. Grayland Ormond in to see patient and perform H&P. Her local study labs were collected 6 days ago and were all within acceptable range for the patient to receive her treatment this morning. Infusion nurse Meribeth Mattes, RN received report and was provided with an infusion treatment guide informing of the actual treatment plan, infusion of Atezolizumab first over 60 minutes with no pre-meds. Carrie Alvarez was instructed to monitor full VS pre-infusion, every 15 minutes during the infusion and 30 minutes post infusion. She will administer pre-meds for the FOLFOX following the IP infusion. Adverse Event Log  Study/Protocol: [id number] [name] Cycle: Baseline  Event Grade Onset Date Resolved Date Drug Name Attribution Treatment Comments  Diarrhea 1 01/10/2019  n/a surgery None currently  Patient reports experiencing increased BMs since her colectomy  Abdominal pain 1 01/10/2019  n/a surgery Oxycodone States occasional shooting pain 3/10  Constipation 0      Denies  Nausea 0      None in 2-3 weeks  Fatigue 1 06/12/2018 approx   ?  Reports no energy in past 6-9 months  Anorexia 1    ?  States this is her norm for several years  Cough 0        Dyspnea 0        Fever 0        UTI 0        Palmer-plantar erythrodesia 0        Total biliruben 0        Nuetrophils 0        Platelets 0        TSH 0      No history of hypothyroidism  Sensory Peripheral Neuropathy 1 11/19/2014   Paclitaxel gabapentin Tingling in fingertips and feet bil. since taking [place], BSN, MHA, OCN 02/13/2019 10:46 AM

## 2019-02-14 ENCOUNTER — Encounter: Payer: Self-pay | Admitting: Surgery

## 2019-02-14 ENCOUNTER — Telehealth: Payer: Self-pay | Admitting: *Deleted

## 2019-02-14 NOTE — Telephone Encounter (Signed)
T/C made to [name] this morning to follow up her initial chemo infusion with Atezolizumab + FOLFOX yesterday. Patient reports she is doing well, states she slept well last night and denies experiencing any problems with the 5FU pump since she left the [facility] yesterday. She denies any side effects except that when she tried to drink fluids she felt like there was a "knife going down her throat." Reminded her that this is a side effect of Oxaliplatin and reinforced that she should avoid drinking cold liquids and touching cold objects. Instructed her to wear gloves any time she has to go into her refrigerator or freezer and to drink room temperature beverages and patient agrees. Also instructed to call myself or Dr. Grayland Ormond if she begins to experience diarrhea or watery stools; especially if she is not drinking plenty of fluids. Patient states she remembers this from having chemotherapy in the past. Verifies she has her appointment for tomorrow to have the pump removed. [name], BSN, MHA, OCN 02/14/2019 10:30 AM

## 2019-02-15 ENCOUNTER — Other Ambulatory Visit: Payer: Self-pay

## 2019-02-15 ENCOUNTER — Inpatient Hospital Stay: Payer: Medicare Other

## 2019-02-15 VITALS — BP 108/74 | HR 68 | Resp 18

## 2019-02-15 DIAGNOSIS — Z5111 Encounter for antineoplastic chemotherapy: Secondary | ICD-10-CM | POA: Diagnosis not present

## 2019-02-15 DIAGNOSIS — C184 Malignant neoplasm of transverse colon: Secondary | ICD-10-CM

## 2019-02-15 MED ORDER — HEPARIN SOD (PORK) LOCK FLUSH 100 UNIT/ML IV SOLN
500.0000 [IU] | Freq: Once | INTRAVENOUS | Status: AC | PRN
  Administered 2019-02-15: 500 [IU]
  Filled 2019-02-15: qty 5

## 2019-02-16 ENCOUNTER — Telehealth: Payer: Self-pay | Admitting: *Deleted

## 2019-02-16 NOTE — Telephone Encounter (Signed)
Patient Carrie Alvarez called me to ask about taking new ondansetron. States she picked up the med yesterday and the instructions state to take twice a day for 2 days following chemotherapy. Informed patient that this is a medication for nausea and patient denies experiencing any nausea at all. States she is eating and drinking well at present, but cannot drink cold liquids due to feeling like she has knives in her throat. Reminded that that is a side effect of the Oxaliplatin and instructed to drink only room temperature or warm liquids - but nothing cold. Patient states she remembers and hopes this is temporary. Instructed that if she feels nauseated at all, even just waves of nausea, she should take the ondansetron and she agrees. Also states she had a normal bowel movement yesterday - no diarrhea. [name], BSN, MHA, OCN 02/16/2019 8:16 AM

## 2019-02-17 ENCOUNTER — Other Ambulatory Visit: Payer: Self-pay

## 2019-02-17 ENCOUNTER — Telehealth: Payer: Self-pay | Admitting: *Deleted

## 2019-02-17 LAB — SURGICAL PATHOLOGY

## 2019-02-17 NOTE — Telephone Encounter (Signed)
T/C made to patient Carrie Alvarez to follow up side effects from her recent chemotherapy. Patient states she is not eating much, but is drinking plenty of fluids and states she is still having to drink room temperature fluids due to cold sensitivity. Patient denies experiencing any constipation or diarrhea, and does report feeling weaker than usual since her treatment. Reminded her to call if she develops any further side effects that she cannot control and instructed to call the main [facility] phone number to have them page the physician on call. Patient voices agreement. [name], BSN, MHA, OCN 02/17/2019 4:14 PM

## 2019-02-18 NOTE — Progress Notes (Signed)
[place]  [phone] [phone]  ID: Carrie Alvarez OB: [birth year]  MR#: [id number]  EXN#:[id number]  Patient Care Team: Paulene Floor as PCP - General (Physician Assistant) [name], MD as Consulting Physician (Oncology) [name], MD as Attending Physician (Pain Medicine) [name]., MD as Consulting Physician (Ophthalmology) [name], RN as Oncology Nurse Navigator  I connected with Carrie Alvarez on 02/21/19 at  3:30 PM EDT by telephone visit and verified that I am speaking with the correct person using two identifiers.   I discussed the limitations, risks, security and privacy concerns of performing an evaluation and management service by telemedicine and the availability of in-person appointments. I also discussed with the patient that there may be a patient responsible charge related to this service. The patient expressed understanding and agreed to proceed.   Other persons participating in the visit and their role in the encounter: Patient, MD  Patient's location: Home Provider's location: Clinic  CHIEF COMPLAINT: Stage IIa ER+ left breast cancer with no breast lesion and axillary lymph node metastasis.  Now with stage IIIa colon cancer.  INTERVAL HISTORY: Patient agreed for further evaluation via telephone today.  She received cycle 1 of FOLFOX plus Tecentriq on clinical trial last week and tolerated it relatively well.  She has persistent abdominal pain. She continues to have a mild peripheral neuropathy, but no other neurologic complaints.  She has a good appetite and denies weight loss.  She denies any recent fevers or illnesses.  She has no chest pain, shortness of breath, cough, or hemoptysis.  She denies any nausea, vomiting, constipation, or diarrhea.  She has no melena or hematochezia.  She has no urinary complaints.  Patient offers no further specific complaints  today.  REVIEW OF SYSTEMS:   Review of Systems  Constitutional: Negative.  Negative for fever, malaise/fatigue and weight loss.  Respiratory: Negative.  Negative for cough and shortness of breath.   Cardiovascular: Negative.  Negative for chest pain and leg swelling.  Gastrointestinal: Positive for abdominal pain. Negative for blood in stool, constipation, diarrhea, melena, nausea and vomiting.  Genitourinary: Negative.  Negative for dysuria.  Musculoskeletal: Negative.  Negative for back pain.  Skin: Negative.  Negative for rash.  Neurological: Positive for sensory change. Negative for dizziness, focal weakness, weakness and headaches.  Psychiatric/Behavioral: Negative.  Negative for depression. The patient is not nervous/anxious.     As per HPI. Otherwise, a complete review of systems is negative.  PAST MEDICAL HISTORY: Past Medical History:  Diagnosis Date  . Allergy   . Anemia   . Back pain   . Breast cancer ([place]) 2015   LT LUMPECTOMY 12-2014 FOLLOWING CHEMO  . Carpal tunnel syndrome    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  . Cataract   . Colon cancer ([place]) 2020  . Depression   . GERD (gastroesophageal reflux disease)    problem with reflux during chemo  . History of methicillin resistant staphylococcus aureus (MRSA)   . Hypercholesteremia   . Hypertension   . Lumbosacral pain    sees pain management in [place]  . Obesity   . Personal history of chemotherapy 2016  . Pinched nerve    left side, lumbar area  . Uterine cancer ([place]) 1994    PAST SURGICAL HISTORY: Past Surgical History:  Procedure Laterality Date  . ABDOMINAL HYSTERECTOMY  1994   UTERINE CA  . AXILLARY LYMPH NODE BIOPSY Left 12/18/2014  Procedure: AXILLARY LYMPH NODE BIOPSY/;  Surgeon: Robert Bellow, MD;  Location: [facility];  Service: General;  Laterality: Left;  . AXILLARY LYMPH NODE DISSECTION Left 12/18/2014   Procedure: AXILLARY LYMPH NODE DISSECTION;  Surgeon: Robert Bellow, MD;   Location: [facility];  Service: General;  Laterality: Left;  . BREAST BIOPSY Left 05/2014   CORE BX OF LN, METASTATIC ADENOCARCINOMA  . BREAST LUMPECTOMY Left 12/2014   CHEMO FIRST THEN SURGERY OF LN  . BREAST SURGERY     lymph node removal  . CHOLECYSTECTOMY N/A 04/19/2016   Procedure: LAPAROSCOPIC CHOLECYSTECTOMY WITH INTRAOPERATIVE CHOLANGIOGRAM;  Surgeon: Hubbard Robinson, MD;  Location: [facility];  Service: General;  Laterality: N/A;  . COLON RESECTION Right 01/10/2019   Procedure: HAND ASSISTED LAPAROSCOPIC RIGHT COLON RESECTION;  Surgeon: Jules Husbands, MD;  Location: [facility];  Service: General;  Laterality: Right;  . COLONOSCOPY WITH PROPOFOL N/A 12/30/2018   Procedure: COLONOSCOPY WITH PROPOFOL;  Surgeon: Lucilla Lame, MD;  Location: [facility];  Service: Endoscopy;  Laterality: N/A;  . medial branch block  11/06/2015   lumbar facet Dr. Primus Bravo  . POLYPECTOMY N/A 12/30/2018   Procedure: POLYPECTOMY;  Surgeon: Lucilla Lame, MD;  Location: [facility];  Service: Endoscopy;  Laterality: N/A;  Clips placed at Hepatic Flexure Polyp (2) and Transverse Colon Polyp (4) removal sites  . PORTACATH PLACEMENT Right 02/02/2019   Procedure: INSERTION PORT-A-CATH;  Surgeon: Jules Husbands, MD;  Location: [facility];  Service: General;  Laterality: Right;  . SENTINEL NODE BIOPSY Left 12/18/2014   Procedure: SENTINEL NODE BIOPSY;  Surgeon: Robert Bellow, MD;  Location: [facility];  Service: General;  Laterality: Left;    FAMILY HISTORY Family History  Problem Relation Age of Onset  . Breast cancer Sister 69  . Colon cancer Sister   . Colon cancer Mother   . Hypertension Mother   . Asthma Mother   . Cancer Father        ADVANCED DIRECTIVES:    HEALTH MAINTENANCE: Social History   Tobacco Use  . Smoking status: Former Smoker    Packs/day: 0.50    Types: Cigarettes    Quit date: 07/18/2014    Years since quitting: 4.6  . Smokeless tobacco: Never Used  Substance Use Topics   . Alcohol use: No    Alcohol/week: 0.0 standard drinks  . Drug use: No    No Known Allergies  Current Outpatient Medications  Medication Sig Dispense Refill  . acetaminophen (TYLENOL) 500 MG tablet Take 500 mg by mouth every 6 (six) hours as needed for mild pain or moderate pain.     Marland Kitchen albuterol (PROVENTIL HFA;VENTOLIN HFA) 108 (90 Base) MCG/ACT inhaler Inhale 2 puffs into the lungs every 6 (six) hours as needed for wheezing or shortness of breath. 1 Inhaler 2  . amLODipine (NORVASC) 10 MG tablet Take 1 tablet (10 mg total) by mouth daily. (Patient taking differently: Take 10 mg by mouth every morning. ) 90 tablet 0  . atorvastatin (LIPITOR) 10 MG tablet Take 1 tablet (10 mg total) by mouth daily. (Patient taking differently: Take 10 mg by mouth every morning. ) 90 tablet 0  . baclofen (LIORESAL) 10 MG tablet Take 1 tablet (10 mg total) by mouth 2 (two) times daily. (Patient taking differently: Take 10 mg by mouth 3 (three) times daily as needed for muscle spasms. ) 30 each 0  . bisacodyl (BISACODYL) 5 MG EC tablet Take 4 tablets (5 mg each)  at 8 am the day before surgery. 4 tablet 0  . cholecalciferol (VITAMIN D) 1000 units tablet Take 1,000 Units by mouth daily.    . fluticasone (FLONASE) 50 MCG/ACT nasal spray USE 2 SPRAY(S) IN EACH NOSTRIL AS NEEDED FOR ALLERGIES OR RHINITIS. (Patient taking differently: Place 2 sprays into both nostrils daily as needed for allergies or rhinitis. ) 16 g 0  . gabapentin (NEURONTIN) 300 MG capsule Take 300-600 mg by mouth 4 (four) times daily as needed (neuropathic pain).     Marland Kitchen HYDROcodone-acetaminophen (NORCO/VICODIN) 5-325 MG tablet Take 1 tablet by mouth every 6 (six) hours as needed for moderate pain. 30 tablet 0  . letrozole (FEMARA) 2.5 MG tablet Take 1 tablet (2.5 mg total) by mouth daily. 90 tablet 3  . lidocaine-prilocaine (EMLA) cream Apply to affected area once 30 g 3  . pyridoxine (B-6) 100 MG tablet Take 100 mg by mouth daily.    . sertraline  (ZOLOFT) 100 MG tablet Take 2 tablets (200 mg total) by mouth daily. (Patient taking differently: Take 200 mg by mouth at bedtime. ) 180 tablet 0  . vitamin B-12 (CYANOCOBALAMIN) 500 MCG tablet Take 500 mcg by mouth daily.    Marland Kitchen loperamide (IMODIUM) 2 MG capsule Take 81m at onset of diarrhea, then 246mq2h ATC until without diarrhea x 12 hrs. May take 29m73m4h at HS. Max dose 9m529my. (Patient not taking: Reported on 02/20/2019) 100 capsule 1  . ondansetron (ZOFRAN ODT) 4 MG disintegrating tablet Take 1 tablet (4 mg total) by mouth every 8 (eight) hours as needed for nausea or vomiting. (Patient not taking: Reported on 02/20/2019) 20 tablet 0  . ondansetron (ZOFRAN) 8 MG tablet Take 1 tablet (8 mg total) by mouth 2 (two) times daily. Start the day after chemo for 2 days. Then take as needed for nausea or vomiting. (Patient not taking: Reported on 02/20/2019) 30 tablet 2   No current facility-administered medications for this visit.    Facility-Administered Medications Ordered in Other Visits  Medication Dose Route Frequency Provider Last Rate Last Dose  . heparin lock flush 100 unit/mL  500 Units Intravenous Once FinnGrayland OrmondoKathlene November        OBJECTIVE: There were no vitals filed for this visit.   There is no height or weight on file to calculate BMI.    ECOG FS:1 - Symptomatic but completely ambulatory   LAB RESULTS:  Lab Results  Component Value Date   NA 143 02/20/2019   K 3.8 02/20/2019   CL 108 02/20/2019   CO2 26 02/20/2019   GLUCOSE 118 (H) 02/20/2019   BUN 17 02/20/2019   CREATININE 1.16 (H) 02/20/2019   CALCIUM 9.8 02/20/2019   PROT 7.4 02/20/2019   ALBUMIN 4.1 02/20/2019   AST 18 02/20/2019   ALT 15 02/20/2019   ALKPHOS 75 02/20/2019   BILITOT 0.8 02/20/2019   GFRNONAA 48 (L) 02/20/2019   GFRAA 56 (L) 02/20/2019    Lab Results  Component Value Date   WBC 10.1 02/20/2019   NEUTROABS 7.5 02/20/2019   HGB 10.9 (L) 02/20/2019   HCT 31.1 (L) 02/20/2019   MCV 80.4  02/20/2019   PLT 193 02/20/2019     STUDIES: Dg Chest 1 View  Result Date: 02/02/2019 CLINICAL DATA:  Port-A-Cath insertion EXAM: CHEST  1 VIEW COMPARISON:  12/28/2016 FINDINGS: Right Port-A-Cath is in place with the tip in the SVC. No pneumothorax. Heart and mediastinal contours are within normal limits. No focal opacities or  effusions. No acute bony abnormality. IMPRESSION: Right Port-A-Cath tip in the SVC. No pneumothorax. No active cardiopulmonary disease. Electronically Signed   By: Rolm Baptise M.D.   On: 02/02/2019 10:37   Ct Chest W Contrast  Result Date: 02/07/2019 CLINICAL DATA:  Newly diagnosed colon carcinoma. Staging. EXAM: CT CHEST WITH CONTRAST TECHNIQUE: Multidetector CT imaging of the chest was performed during intravenous contrast administration. CONTRAST:  32m OMNIPAQUE IOHEXOL 300 MG/ML  SOLN COMPARISON:  06/04/2014 FINDINGS: Cardiovascular: No acute findings. Aortic atherosclerosis. 4.1 cm ascending thoracic aortic aneurysm noted. Mediastinum/Nodes: No masses or pathologically enlarged lymph nodes identified. Lungs/Pleura: No suspicious pulmonary nodules or masses are identified. No evidence of pulmonary infiltrate or pleural effusion. Upper Abdomen:  Unremarkable. Musculoskeletal:  No suspicious bone lesions. IMPRESSION: 1. No evidence of thoracic metastatic disease or other acute findings. 2. 4.1 cm ascending thoracic aortic aneurysm. Recommend annual imaging followup by CTA or MRA. This recommendation follows 2010 ACCF/AHA/AATS/ACR/ASA/SCA/SCAI/SIR/STS/SVM Guidelines for the Diagnosis and Management of Patients with Thoracic Aortic Disease. Circulation. 2010; 121:: T700-F749 Aortic aneurysm NOS (ICD10-I71.9) Aortic Atherosclerosis (ICD10-I70.0). Electronically Signed   By: JMarlaine HindM.D.   On: 02/07/2019 11:32   Dg C-arm 1-60 Min-no Report  Result Date: 02/02/2019 Fluoroscopy was utilized by the requesting physician.  No radiographic interpretation.    ASSESSMENT: Stage  IIa ER+ left breast cancer with no breast lesion and axillary lymph node metastasis. (TxN1M0)  HER-2 negative.  Now with stage IIIa colon cancer.  PLAN:   1.  Stage IIIa colon cancer: Pathology report reviewed independently.  CT scan of her abdomen on January 02, 2019 that did not reveal metastatic disease.  CT scan of the chest on February 07, 2019 also was negative for metastatic disease.  Patient agreed to enroll in clinical trial and received cycle 1 of 12 of FOLFOX plus Tecentriq last week.  Return to clinic in 1 week for further evaluation and consideration of cycle 2. 2. Stage IIa ER+ left breast cancer with no breast lesion and axillary lymph node metastasis: Despite no obvious breast lesion on mammogram or breast MRI, pathology and pattern of spread was consistent with primary breast cancer. CT and bone scan at time of diagnosis revealed no other evidence of malignancy.  She only received 3 cycles of Adriamycin and Cytoxan.  Taxol was discontinued early as well secondary to persistent peripheral neuropathy. Patient completed her chemotherapy on Nov 19, 2014.  She elected not to pursue axillary node dissection given the potential morbidity of this procedure. It was also elected not to pursue adjuvant XRT given there was no primary breast lesion.  Patient has had approximately 4 years of treatment with letrozole, but this has been discontinued secondary to enrolling in clinical trial for her newly diagnosed colon cancer.  Her most recent mammogram on June 29, 2018 was reported as BI-RADS 2.  Repeat in December 2020.  Continue to monitor throughout treatment for colon cancer. 3.  Bone health: Patient's most recent bone mineral density on February 03, 2018 reported T score of -0.7 which is unchanged from 3 years prior.  Consider repeat bone mineral density in July 2021.  4.  Family history: Patient's sister reports she has positive for Lynch syndrome, but these results were never confirmed.  Patient will require  genetic testing in the future. 5. Pain/Peripheral neuropathy: Chronic and unchanged.  Monitor closely while receiving oxaliplatin 6. Hot flashes: Patient does not complain of this today.  Letrozole has been discontinued. 7: Anemia: Mild, monitor.  I  provided 15 minutes of face-to-face video visit time during this encounter, and > 50% was spent counseling as documented under my assessment & plan.   Patient expressed understanding and was in agreement with this plan. She also understands that She can call clinic at any time with any questions, concerns, or complaints.   Breast cancer metastasized to axillary lymph node   Staging form: Breast, AJCC 7th Edition     Clinical stage from 10/22/2014: Stage Unknown (TX, N1, M0) - Signed by [name], MD on 10/22/2014   [name], MD   02/21/2019 12:55 PM

## 2019-02-20 ENCOUNTER — Inpatient Hospital Stay: Payer: Medicare Other

## 2019-02-20 ENCOUNTER — Other Ambulatory Visit: Payer: Self-pay

## 2019-02-20 ENCOUNTER — Inpatient Hospital Stay (HOSPITAL_BASED_OUTPATIENT_CLINIC_OR_DEPARTMENT_OTHER): Payer: Medicare Other | Admitting: Oncology

## 2019-02-20 ENCOUNTER — Encounter: Payer: Self-pay | Admitting: Oncology

## 2019-02-20 DIAGNOSIS — C50912 Malignant neoplasm of unspecified site of left female breast: Secondary | ICD-10-CM

## 2019-02-20 DIAGNOSIS — C773 Secondary and unspecified malignant neoplasm of axilla and upper limb lymph nodes: Secondary | ICD-10-CM | POA: Diagnosis not present

## 2019-02-20 DIAGNOSIS — C184 Malignant neoplasm of transverse colon: Secondary | ICD-10-CM | POA: Diagnosis not present

## 2019-02-20 DIAGNOSIS — Z5111 Encounter for antineoplastic chemotherapy: Secondary | ICD-10-CM | POA: Diagnosis not present

## 2019-02-20 LAB — CBC WITH DIFFERENTIAL/PLATELET
Abs Immature Granulocytes: 0.05 10*3/uL (ref 0.00–0.07)
Basophils Absolute: 0.1 10*3/uL (ref 0.0–0.1)
Basophils Relative: 1 %
Eosinophils Absolute: 0.4 10*3/uL (ref 0.0–0.5)
Eosinophils Relative: 4 %
HCT: 31.1 % — ABNORMAL LOW (ref 36.0–46.0)
Hemoglobin: 10.9 g/dL — ABNORMAL LOW (ref 12.0–15.0)
Immature Granulocytes: 1 %
Lymphocytes Relative: 18 %
Lymphs Abs: 1.8 10*3/uL (ref 0.7–4.0)
MCH: 28.2 pg (ref 26.0–34.0)
MCHC: 35 g/dL (ref 30.0–36.0)
MCV: 80.4 fL (ref 80.0–100.0)
Monocytes Absolute: 0.4 10*3/uL (ref 0.1–1.0)
Monocytes Relative: 4 %
Neutro Abs: 7.5 10*3/uL (ref 1.7–7.7)
Neutrophils Relative %: 72 %
Platelets: 193 10*3/uL (ref 150–400)
RBC: 3.87 MIL/uL (ref 3.87–5.11)
RDW: 14.4 % (ref 11.5–15.5)
WBC: 10.1 10*3/uL (ref 4.0–10.5)
nRBC: 0 % (ref 0.0–0.2)

## 2019-02-20 LAB — COMPREHENSIVE METABOLIC PANEL
ALT: 15 U/L (ref 0–44)
AST: 18 U/L (ref 15–41)
Albumin: 4.1 g/dL (ref 3.5–5.0)
Alkaline Phosphatase: 75 U/L (ref 38–126)
Anion gap: 9 (ref 5–15)
BUN: 17 mg/dL (ref 8–23)
CO2: 26 mmol/L (ref 22–32)
Calcium: 9.8 mg/dL (ref 8.9–10.3)
Chloride: 108 mmol/L (ref 98–111)
Creatinine, Ser: 1.16 mg/dL — ABNORMAL HIGH (ref 0.44–1.00)
GFR calc Af Amer: 56 mL/min — ABNORMAL LOW (ref >60)
GFR calc non Af Amer: 48 mL/min — ABNORMAL LOW (ref >60)
Glucose, Bld: 118 mg/dL — ABNORMAL HIGH (ref 70–99)
Potassium: 3.8 mmol/L (ref 3.5–5.1)
Sodium: 143 mmol/L (ref 135–145)
Total Bilirubin: 0.8 mg/dL (ref 0.3–1.2)
Total Protein: 7.4 g/dL (ref 6.5–8.1)

## 2019-02-20 NOTE — Progress Notes (Signed)
Patient is wanting her lab results. Patient also wanted to let you know that she's been having abdominal pain for 3 days now. Patient stated that it causes for her to have spasms. However, she stated that she is able to pass gas. Patient denied fever, chill, nausea, vomiting, diarrhea and constipation.

## 2019-02-23 NOTE — Progress Notes (Signed)
Carrie Alvarez  [phone] [phone]  ID: Norm Salt OB: 11-30-1950  MR#: [id number]  SWN#:[id number]  Patient Care Team: Paulene Floor as PCP - General (Physician Assistant) [name], MD as Consulting Physician (Oncology) [name], MD as Attending Physician (Pain Medicine) [name]., MD as Consulting Physician (Ophthalmology) [name], RN as Oncology Nurse Navigator  CHIEF COMPLAINT: Stage IIa ER+ left breast cancer with no breast lesion and axillary lymph node metastasis.  Now with stage IIIa colon cancer.  INTERVAL HISTORY: Patient returns to clinic today for further evaluation and consideration of cycle 2 of FOLFOX plus Tecentriq on clinical trial.  She has increased weakness and fatigue as well as occasional abdominal pain, but otherwise feels well.  She continues to have a mild peripheral neuropathy, but no other neurologic complaints.  She has a good appetite and denies weight loss.  She denies any recent fevers or illnesses.  She has no chest pain, shortness of breath, cough, or hemoptysis.  She denies any nausea, vomiting, constipation, or diarrhea.  She has no melena or hematochezia.  She has no urinary complaints.  Patient offers no further specific complaints today.  REVIEW OF SYSTEMS:   Review of Systems  Constitutional: Negative.  Negative for fever, malaise/fatigue and weight loss.  Respiratory: Negative.  Negative for cough and shortness of breath.   Cardiovascular: Negative.  Negative for chest pain and leg swelling.  Gastrointestinal: Positive for abdominal pain. Negative for blood in stool, constipation, diarrhea, melena, nausea and vomiting.  Genitourinary: Negative.  Negative for dysuria.  Musculoskeletal: Negative.  Negative for back pain.  Skin: Negative.  Negative for rash.  Neurological: Positive for sensory change. Negative for dizziness, focal weakness, weakness and  headaches.  Psychiatric/Behavioral: Negative.  Negative for depression. The patient is not nervous/anxious.     As per HPI. Otherwise, a complete review of systems is negative.  PAST MEDICAL HISTORY: Past Medical History:  Diagnosis Date  . Allergy   . Anemia   . Back pain   . Breast cancer ([place]) 2015   LT LUMPECTOMY 12-2014 FOLLOWING CHEMO  . Carpal tunnel syndrome    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  . Cataract   . Colon cancer ([place]) 2020  . Depression   . GERD (gastroesophageal reflux disease)    problem with reflux during chemo  . History of methicillin resistant staphylococcus aureus (MRSA)   . Hypercholesteremia   . Hypertension   . Lumbosacral pain    sees pain management in [place]  . Obesity   . Personal history of chemotherapy 2016  . Pinched nerve    left side, lumbar area  . Uterine cancer ([place]) 1994    PAST SURGICAL HISTORY: Past Surgical History:  Procedure Laterality Date  . ABDOMINAL HYSTERECTOMY  1994   UTERINE CA  . AXILLARY LYMPH NODE BIOPSY Left 12/18/2014   Procedure: AXILLARY LYMPH NODE BIOPSY/;  Surgeon: Robert Bellow, MD;  Location: [facility];  Service: General;  Laterality: Left;  . AXILLARY LYMPH NODE DISSECTION Left 12/18/2014   Procedure: AXILLARY LYMPH NODE DISSECTION;  Surgeon: Robert Bellow, MD;  Location: [facility];  Service: General;  Laterality: Left;  . BREAST BIOPSY Left 05/2014   CORE BX OF LN, METASTATIC ADENOCARCINOMA  . BREAST LUMPECTOMY Left 12/2014   CHEMO FIRST THEN SURGERY OF LN  . BREAST SURGERY     lymph node removal  . CHOLECYSTECTOMY N/A 04/19/2016   Procedure: LAPAROSCOPIC  CHOLECYSTECTOMY WITH INTRAOPERATIVE CHOLANGIOGRAM;  Surgeon: Hubbard Robinson, MD;  Location: [facility];  Service: General;  Laterality: N/A;  . COLON RESECTION Right 01/10/2019   Procedure: HAND ASSISTED LAPAROSCOPIC RIGHT COLON RESECTION;  Surgeon: Jules Husbands, MD;  Location: [facility];  Service: General;  Laterality: Right;  .  COLONOSCOPY WITH PROPOFOL N/A 12/30/2018   Procedure: COLONOSCOPY WITH PROPOFOL;  Surgeon: Lucilla Lame, MD;  Location: [place];  Service: Endoscopy;  Laterality: N/A;  . medial branch block  11/06/2015   lumbar facet Dr. Primus Bravo  . POLYPECTOMY N/A 12/30/2018   Procedure: POLYPECTOMY;  Surgeon: Lucilla Lame, MD;  Location: [place];  Service: Endoscopy;  Laterality: N/A;  Clips placed at Hepatic Flexure Polyp (2) and Transverse Colon Polyp (4) removal sites  . PORTACATH PLACEMENT Right 02/02/2019   Procedure: INSERTION PORT-A-CATH;  Surgeon: Jules Husbands, MD;  Location: [facility];  Service: General;  Laterality: Right;  . SENTINEL NODE BIOPSY Left 12/18/2014   Procedure: SENTINEL NODE BIOPSY;  Surgeon: Robert Bellow, MD;  Location: [facility];  Service: General;  Laterality: Left;    FAMILY HISTORY Family History  Problem Relation Age of Onset  . Breast cancer Sister 62  . Colon cancer Sister   . Colon cancer Mother   . Hypertension Mother   . Asthma Mother   . Cancer Father        ADVANCED DIRECTIVES:    HEALTH MAINTENANCE: Social History   Tobacco Use  . Smoking status: Former Smoker    Packs/day: 0.50    Types: Cigarettes    Quit date: 07/18/2014    Years since quitting: 4.6  . Smokeless tobacco: Never Used  Substance Use Topics  . Alcohol use: No    Alcohol/week: 0.0 standard drinks  . Drug use: No    No Known Allergies  Current Outpatient Medications  Medication Sig Dispense Refill  . acetaminophen (TYLENOL) 500 MG tablet Take 500 mg by mouth every 6 (six) hours as needed for mild pain or moderate pain.     Marland Kitchen amLODipine (NORVASC) 10 MG tablet Take 1 tablet (10 mg total) by mouth daily. (Patient taking differently: Take 10 mg by mouth every morning. ) 90 tablet 0  . atorvastatin (LIPITOR) 10 MG tablet Take 1 tablet (10 mg total) by mouth daily. (Patient taking differently: Take 10 mg by mouth every morning. ) 90 tablet 0  . baclofen (LIORESAL) 10  MG tablet Take 1 tablet (10 mg total) by mouth 2 (two) times daily. (Patient taking differently: Take 10 mg by mouth 3 (three) times daily as needed for muscle spasms. ) 30 each 0  . cholecalciferol (VITAMIN D) 1000 units tablet Take 1,000 Units by mouth daily.    Marland Kitchen gabapentin (NEURONTIN) 300 MG capsule Take 300-600 mg by mouth 4 (four) times daily as needed (neuropathic pain).     Marland Kitchen HYDROcodone-acetaminophen (NORCO/VICODIN) 5-325 MG tablet Take 1 tablet by mouth every 6 (six) hours as needed for moderate pain. 30 tablet 0  . letrozole (FEMARA) 2.5 MG tablet Take 1 tablet (2.5 mg total) by mouth daily. 90 tablet 3  . Oxycodone HCl 10 MG TABS LIMIT ONE HALF TO ONE TABLET BY MOUTH 3 TO 5 TIMES DAILY IF TOLERATED    . pyridoxine (B-6) 100 MG tablet Take 100 mg by mouth daily.    . vitamin B-12 (CYANOCOBALAMIN) 500 MCG tablet Take 500 mcg by mouth daily.    Marland Kitchen albuterol (PROVENTIL HFA;VENTOLIN HFA) 108 (90 Base)  MCG/ACT inhaler Inhale 2 puffs into the lungs every 6 (six) hours as needed for wheezing or shortness of breath. (Patient not taking: Reported on 02/27/2019) 1 Inhaler 2  . bisacodyl (BISACODYL) 5 MG EC tablet Take 4 tablets (5 mg each) at 8 am the day before surgery. (Patient not taking: Reported on 02/27/2019) 4 tablet 0  . fluticasone (FLONASE) 50 MCG/ACT nasal spray USE 2 SPRAY(S) IN EACH NOSTRIL AS NEEDED FOR ALLERGIES OR RHINITIS. (Patient not taking: No sig reported) 16 g 0  . lidocaine-prilocaine (EMLA) cream Apply to affected area once 30 g 3  . loperamide (IMODIUM) 2 MG capsule Take 332m at onset of diarrhea, then 237mq2h ATC until without diarrhea x 12 hrs. May take 32m49m4h at HS. Max dose 80m39my. (Patient not taking: Reported on 02/20/2019) 100 capsule 1  . ondansetron (ZOFRAN ODT) 4 MG disintegrating tablet Take 1 tablet (4 mg total) by mouth every 8 (eight) hours as needed for nausea or vomiting. (Patient not taking: Reported on 02/20/2019) 20 tablet 0  . ondansetron (ZOFRAN) 8 MG tablet  Take 1 tablet (8 mg total) by mouth 2 (two) times daily. Start the day after chemo for 2 days. Then take as needed for nausea or vomiting. (Patient not taking: Reported on 02/20/2019) 30 tablet 2  . sertraline (ZOLOFT) 100 MG tablet Take 2 tablets (200 mg total) by mouth daily. (Patient taking differently: Take 200 mg by mouth at bedtime. ) 180 tablet 0   No current facility-administered medications for this visit.    Facility-Administered Medications Ordered in Other Visits  Medication Dose Route Frequency Provider Last Rate Last Dose  . fluorouracil ALLIANCE [id number]) 4,700 mg in sodium chloride 0.9 % 56 mL chemo infusion  2,400 mg/m2 (Treatment Plan Recorded) Intravenous 1 day or 1 dose FinnLloyd Huger      . fluorouracil ALLIANCE [id number]) chemo injection 800 mg  400 mg/m2 (Treatment Plan Recorded) Intravenous Once FinnLloyd Huger      . heparin lock flush 100 unit/mL  500 Units Intravenous Once FinnLloyd Huger      . heparin lock flush 100 unit/mL  500 Units Intravenous Once FinnLloyd Huger      . leucovorin ALLIANCE [id number] mg in dextrose 5 % 250 mL infusion  800 mg Intravenous Once FinnLloyd Huger 145 mL/hr at 02/27/19 1236 800 mg at 02/27/19 1236  . oxaliplatin ALLIANCE [id number]) 165 mg in dextrose 5 % 500 mL chemo infusion  165 mg Intravenous Once FinnLloyd Huger 267 mL/hr at 02/27/19 1235 165 mg at 02/27/19 1235  . sodium chloride flush (NS) 0.9 % injection 10 mL  10 mL Intravenous PRN FinnLloyd Huger   10 mL at 02/27/19 0925    OBJECTIVE: Vitals:   02/27/19 0937  BP: 124/90  Pulse: 78  Temp: 97.9 F (36.6 C)     Body mass index is 34.68 kg/m.    ECOG FS:1 - Symptomatic but completely ambulatory  General: Well-developed, well-nourished, no acute distress. Eyes: Pink conjunctiva, anicteric sclera. HEENT: Normocephalic, moist mucous membranes. Lungs: Clear to auscultation bilaterally. Heart:  Regular rate and rhythm. No rubs, murmurs, or gallops. Abdomen: Soft, nontender, nondistended. No organomegaly noted, normoactive bowel sounds. Musculoskeletal: No edema, cyanosis, or clubbing. Neuro: Alert, answering all questions appropriately. Cranial nerves grossly intact. Skin: No rashes or petechiae noted. Psych: Normal affect.  LAB RESULTS:  Lab Results  Component Value Date  NA 144 02/27/2019   K 3.0 (L) 02/27/2019   CL 110 02/27/2019   CO2 27 02/27/2019   GLUCOSE 107 (H) 02/27/2019   BUN 8 02/27/2019   CREATININE 1.19 (H) 02/27/2019   CALCIUM 9.1 02/27/2019   PROT 6.4 (L) 02/27/2019   ALBUMIN 3.4 (L) 02/27/2019   AST 17 02/27/2019   ALT 13 02/27/2019   ALKPHOS 70 02/27/2019   BILITOT 0.5 02/27/2019   GFRNONAA 47 (L) 02/27/2019   GFRAA 54 (L) 02/27/2019    Lab Results  Component Value Date   WBC 5.9 02/27/2019   NEUTROABS 4.3 02/27/2019   HGB 9.8 (L) 02/27/2019   HCT 28.1 (L) 02/27/2019   MCV 80.1 02/27/2019   PLT 154 02/27/2019     STUDIES: Dg Chest 1 View  Result Date: 02/02/2019 CLINICAL DATA:  Port-A-Cath insertion EXAM: CHEST  1 VIEW COMPARISON:  12/28/2016 FINDINGS: Right Port-A-Cath is in place with the tip in the SVC. No pneumothorax. Heart and mediastinal contours are within normal limits. No focal opacities or effusions. No acute bony abnormality. IMPRESSION: Right Port-A-Cath tip in the SVC. No pneumothorax. No active cardiopulmonary disease. Electronically Signed   By: Rolm Baptise M.D.   On: 02/02/2019 10:37   Ct Chest W Contrast  Result Date: 02/07/2019 CLINICAL DATA:  Newly diagnosed colon carcinoma. Staging. EXAM: CT CHEST WITH CONTRAST TECHNIQUE: Multidetector CT imaging of the chest was performed during intravenous contrast administration. CONTRAST:  45m OMNIPAQUE IOHEXOL 300 MG/ML  SOLN COMPARISON:  06/04/2014 FINDINGS: Cardiovascular: No acute findings. Aortic atherosclerosis. 4.1 cm ascending thoracic aortic aneurysm noted.  Mediastinum/Nodes: No masses or pathologically enlarged lymph nodes identified. Lungs/Pleura: No suspicious pulmonary nodules or masses are identified. No evidence of pulmonary infiltrate or pleural effusion. Upper Abdomen:  Unremarkable. Musculoskeletal:  No suspicious bone lesions. IMPRESSION: 1. No evidence of thoracic metastatic disease or other acute findings. 2. 4.1 cm ascending thoracic aortic aneurysm. Recommend annual imaging followup by CTA or MRA. This recommendation follows 2010 ACCF/AHA/AATS/ACR/ASA/SCA/SCAI/SIR/STS/SVM Guidelines for the Diagnosis and Management of Patients with Thoracic Aortic Disease. Circulation. 2010; 121:: M196-Q229 Aortic aneurysm NOS (ICD10-I71.9) Aortic Atherosclerosis (ICD10-I70.0). Electronically Signed   By: JMarlaine HindM.D.   On: 02/07/2019 11:32   Dg C-arm 1-60 Min-no Report  Result Date: 02/02/2019 Fluoroscopy was utilized by the requesting physician.  No radiographic interpretation.    ASSESSMENT: Stage IIa ER+ left breast cancer with no breast lesion and axillary lymph node metastasis. (TxN1M0)  HER-2 negative.  Now with stage IIIa colon cancer.  PLAN:   1.  Stage IIIa colon cancer: Pathology report reviewed independently.  CT scan of her abdomen on January 02, 2019 that did not reveal metastatic disease.  CT scan of the chest on February 07, 2019 also was negative for metastatic disease.  Patient agreed to enroll in clinical trial.  Proceed with cycle 2 of 12 of FOLFOX plus Tecentriq today.  Return to clinic in 2 days for pump removal and then in 2 weeks for further evaluation and consideration of cycle 3.  2. Stage IIa ER+ left breast cancer with no breast lesion and axillary lymph node metastasis: Despite no obvious breast lesion on mammogram or breast MRI, pathology and pattern of spread was consistent with primary breast cancer. CT and bone scan at time of diagnosis revealed no other evidence of malignancy.  She only received 3 cycles of Adriamycin and  Cytoxan.  Taxol was discontinued early as well secondary to persistent peripheral neuropathy. Patient completed her chemotherapy on Nov 19, 2014.  She elected not to pursue axillary node dissection given the potential morbidity of this procedure. It was also elected not to pursue adjuvant XRT given there was no primary breast lesion.  Patient has had approximately 4 years of treatment with letrozole, but this has been discontinued secondary to enrolling in clinical trial for her newly diagnosed colon cancer.  Her most recent mammogram on June 29, 2018 was reported as BI-RADS 2.  Repeat in December 2020.  Continue to monitor throughout treatment for colon cancer. 3.  Bone health: Patient's most recent bone mineral density on February 03, 2018 reported T score of -0.7 which is unchanged from 3 years prior.  Consider repeat bone mineral density in July 2021.  4.  Family history: Patient's sister reports she has positive for Lynch syndrome, but these results were never confirmed.  Proceed with genetic testing. 5. Pain/Peripheral neuropathy: Chronic and unchanged.  Monitor closely while receiving oxaliplatin 6. Hot flashes: Patient does not complain of this today.  Letrozole has been discontinued. 7: Anemia: Hemoglobin has trended down slightly to 9.8, monitor. 8.  Renal insufficiency: Mild, monitor. 9.  Hypokalemia: Encourage dietary intake, monitor.   Patient expressed understanding and was in agreement with this plan. She also understands that She can call clinic at any time with any questions, concerns, or complaints.   Breast cancer metastasized to axillary lymph node   Staging form: Breast, AJCC 7th Edition     Clinical stage from 10/22/2014: Stage Unknown (TX, N1, M0) - Signed by [name], MD on 10/22/2014   [name], MD   02/27/2019 1:05 PM

## 2019-02-24 ENCOUNTER — Other Ambulatory Visit: Payer: Self-pay

## 2019-02-27 ENCOUNTER — Inpatient Hospital Stay (HOSPITAL_BASED_OUTPATIENT_CLINIC_OR_DEPARTMENT_OTHER): Payer: Medicare Other | Admitting: Oncology

## 2019-02-27 ENCOUNTER — Inpatient Hospital Stay: Payer: Medicare Other

## 2019-02-27 ENCOUNTER — Other Ambulatory Visit: Payer: Self-pay

## 2019-02-27 ENCOUNTER — Encounter: Payer: Self-pay | Admitting: *Deleted

## 2019-02-27 ENCOUNTER — Encounter: Payer: Self-pay | Admitting: Oncology

## 2019-02-27 VITALS — BP 124/90 | HR 78 | Temp 97.9°F | Wt 198.9 lb

## 2019-02-27 DIAGNOSIS — C184 Malignant neoplasm of transverse colon: Secondary | ICD-10-CM | POA: Diagnosis not present

## 2019-02-27 DIAGNOSIS — Z5111 Encounter for antineoplastic chemotherapy: Secondary | ICD-10-CM | POA: Diagnosis not present

## 2019-02-27 DIAGNOSIS — Z006 Encounter for examination for normal comparison and control in clinical research program: Secondary | ICD-10-CM | POA: Diagnosis not present

## 2019-02-27 LAB — CBC WITH DIFFERENTIAL/PLATELET
Abs Immature Granulocytes: 0.02 10*3/uL (ref 0.00–0.07)
Basophils Absolute: 0 10*3/uL (ref 0.0–0.1)
Basophils Relative: 0 %
Eosinophils Absolute: 0.1 10*3/uL (ref 0.0–0.5)
Eosinophils Relative: 2 %
HCT: 28.1 % — ABNORMAL LOW (ref 36.0–46.0)
Hemoglobin: 9.8 g/dL — ABNORMAL LOW (ref 12.0–15.0)
Immature Granulocytes: 0 %
Lymphocytes Relative: 19 %
Lymphs Abs: 1.1 10*3/uL (ref 0.7–4.0)
MCH: 27.9 pg (ref 26.0–34.0)
MCHC: 34.9 g/dL (ref 30.0–36.0)
MCV: 80.1 fL (ref 80.0–100.0)
Monocytes Absolute: 0.4 10*3/uL (ref 0.1–1.0)
Monocytes Relative: 6 %
Neutro Abs: 4.3 10*3/uL (ref 1.7–7.7)
Neutrophils Relative %: 73 %
Platelets: 154 10*3/uL (ref 150–400)
RBC: 3.51 MIL/uL — ABNORMAL LOW (ref 3.87–5.11)
RDW: 15.9 % — ABNORMAL HIGH (ref 11.5–15.5)
WBC: 5.9 10*3/uL (ref 4.0–10.5)
nRBC: 0 % (ref 0.0–0.2)

## 2019-02-27 LAB — COMPREHENSIVE METABOLIC PANEL
ALT: 13 U/L (ref 0–44)
AST: 17 U/L (ref 15–41)
Albumin: 3.4 g/dL — ABNORMAL LOW (ref 3.5–5.0)
Alkaline Phosphatase: 70 U/L (ref 38–126)
Anion gap: 7 (ref 5–15)
BUN: 8 mg/dL (ref 8–23)
CO2: 27 mmol/L (ref 22–32)
Calcium: 9.1 mg/dL (ref 8.9–10.3)
Chloride: 110 mmol/L (ref 98–111)
Creatinine, Ser: 1.19 mg/dL — ABNORMAL HIGH (ref 0.44–1.00)
GFR calc Af Amer: 54 mL/min — ABNORMAL LOW (ref >60)
GFR calc non Af Amer: 47 mL/min — ABNORMAL LOW (ref >60)
Glucose, Bld: 107 mg/dL — ABNORMAL HIGH (ref 70–99)
Potassium: 3 mmol/L — ABNORMAL LOW (ref 3.5–5.1)
Sodium: 144 mmol/L (ref 135–145)
Total Bilirubin: 0.5 mg/dL (ref 0.3–1.2)
Total Protein: 6.4 g/dL — ABNORMAL LOW (ref 6.5–8.1)

## 2019-02-27 MED ORDER — SODIUM CHLORIDE 0.9 % IV SOLN
840.0000 mg | Freq: Once | INTRAVENOUS | Status: AC
  Administered 2019-02-27: 840 mg via INTRAVENOUS
  Filled 2019-02-27: qty 14

## 2019-02-27 MED ORDER — SODIUM CHLORIDE 0.9 % IV SOLN: 840.0000 mg | Freq: Once | INTRAVENOUS | Status: DC

## 2019-02-27 MED ORDER — DEXTROSE 5 % IV SOLN
Freq: Once | INTRAVENOUS | Status: AC
  Administered 2019-02-27: 13:00:00 via INTRAVENOUS
  Filled 2019-02-27: qty 250

## 2019-02-27 MED ORDER — SODIUM CHLORIDE 0.9% FLUSH
10.0000 mL | INTRAVENOUS | Status: AC | PRN
  Administered 2019-02-27: 10 mL via INTRAVENOUS
  Filled 2019-02-27: qty 10

## 2019-02-27 MED ORDER — SODIUM CHLORIDE 0.9 % IV SOLN
2400.0000 mg/m2 | INTRAVENOUS | Status: DC
  Administered 2019-02-27: 4700 mg via INTRAVENOUS
  Filled 2019-02-27: qty 94

## 2019-02-27 MED ORDER — PALONOSETRON HCL INJECTION 0.25 MG/5ML
0.2500 mg | Freq: Once | INTRAVENOUS | Status: AC
  Administered 2019-02-27: 0.25 mg via INTRAVENOUS
  Filled 2019-02-27: qty 5

## 2019-02-27 MED ORDER — FLUOROURACIL CHEMO INJECTION 2.5 GM/50ML ALLIANCE A021502
400.0000 mg/m2 | Freq: Once | INTRAVENOUS | Status: AC
  Administered 2019-02-27: 800 mg via INTRAVENOUS
  Filled 2019-02-27: qty 16

## 2019-02-27 MED ORDER — SODIUM CHLORIDE 0.9 % IV SOLN
Freq: Once | INTRAVENOUS | Status: AC
  Administered 2019-02-27: 12:00:00 via INTRAVENOUS
  Filled 2019-02-27: qty 5

## 2019-02-27 MED ORDER — OXALIPLATIN CHEMO INJECTION 100 MG/20ML FOR ALLIANCE A021502
165.0000 mg | Freq: Once | INTRAVENOUS | Status: AC
  Administered 2019-02-27: 165 mg via INTRAVENOUS
  Filled 2019-02-27: qty 33

## 2019-02-27 MED ORDER — LEUCOVORIN 350 MG INJECTION FOR ALLIANCE A021502
800.0000 mg | Freq: Once | INTRAVENOUS | Status: AC
  Administered 2019-02-27: 800 mg via INTRAVENOUS
  Filled 2019-02-27: qty 25

## 2019-02-27 MED ORDER — SODIUM CHLORIDE 0.9 % IV SOLN
Freq: Once | INTRAVENOUS | Status: AC
  Administered 2019-02-27: 11:00:00 via INTRAVENOUS
  Filled 2019-02-27: qty 250

## 2019-02-27 MED ORDER — HEPARIN SOD (PORK) LOCK FLUSH 100 UNIT/ML IV SOLN
500.0000 [IU] | Freq: Once | INTRAVENOUS | Status: AC
  Filled 2019-02-27: qty 5

## 2019-02-27 NOTE — Research (Signed)
Patient Carrie Alvarez returns to clinic this morning for her C2D1 treatment on the [id number] [name] study. Local labs collected via port-a-cath per protocol. Solicited adverse events were assessed by this RN and patient completed the C2D1 PRO-CTCAE booklet while waiting to see MD. Patient is walking with a cane this morning in clinic and states she is getting around better. PS still 1. Patient still reporting some pain and a "pulling" sensation in her abdomen at the surgical site, and Dr. Grayland Ormond discussed with her that she may have some internal scar tissue at this site causing her discomfort. H&P completed by Dr. Grayland Ormond. Patient still reporting fatigue and no appetite are the same as before she started chemothrerapy. States she is experiencing 4-5 bowel movements daily, and that her stools are loose at times - again she has been experiencing this since her colectomy on 01/10/2019. Labs reviewed by Dr. Grayland Ormond and he states she is OK to have her treatment today. She is mildly anemic and her K+ level has decreased to 3.0 - which is a grade 1 hypokalemia since she is asymptomatic -  likely related to chemotherapy. Serum creatinine slightly elevated, but essentially the same as baseline. CrCl is 64. Infusion nurse Kerri Perches, RN received report and was provided with an infusion treatment guide informing of the subsequent cycles treatment guide, with C2D1 infusion of Atezolizumab over 30 minutes with or without pre-meds. Cecille Rubin was instructed to monitor full VS pre-infusion, and within 30 minutes post infusion. Parmacist Debbora Lacrosse noted a problem with the Atezolizumab dosing time being +30 hours, but she was able to over ride for this infusion. Will submit to [facility] build team to correct this dosing time error. Adverse events with grade and attribution as noted below. Patient was offered DCP-001 study this morning and she declined participation. This was completed as a decline in the OPEN forum of the [employer].  Adverse Event Log  Study/Protocol: [id number] [name] Cycle: Cycle 1 Event Grade Onset Date Resolved Date Drug Name Attribution Treatment Comments  Diarrhea 1 01/10/2019  n/a surgery None currently Patient reports experiencing increased BMs since her colectomy  Abdominal pain 1 01/10/2019  n/a surgery Oxycodone States occasional shooting pain 3/10  Constipation 0      Denies  Nausea 0      None in 2-3 weeks  Fatigue 1 06/12/2018 approx   ?  Reports no energy in past 6-9 months  Anorexia 1    ?  States this is her norm for several years  Cough 0        Dyspnea 0        Fever 0        UTI 0        Palmer-plantar erythrodesia 0        Total biliruben 0        Nuetrophils 0        Platelets 0        TSH 0      No history of hypothyroidism  Sensory Peripheral Neuropathy 1 11/19/2014   Paclitaxel gabapentin Tingling in fingertips and feet bil. since taking Taxol  Hypokalemia 1 02/27/2019   probable none assymptomatic  Anemia 2 02/27/2019   probable none   Elevated creatinine 1 02/07/2019   unrelated  Elevated at baseline CrCl: [address] BSN, [place], O[facility] 02/27/2019 10:12 AM

## 2019-03-01 ENCOUNTER — Other Ambulatory Visit: Payer: Self-pay

## 2019-03-01 ENCOUNTER — Inpatient Hospital Stay: Payer: Medicare Other

## 2019-03-01 VITALS — BP 107/75 | HR 67 | Temp 96.4°F | Resp 18

## 2019-03-01 DIAGNOSIS — C184 Malignant neoplasm of transverse colon: Secondary | ICD-10-CM

## 2019-03-01 DIAGNOSIS — Z5111 Encounter for antineoplastic chemotherapy: Secondary | ICD-10-CM | POA: Diagnosis not present

## 2019-03-01 MED ORDER — HEPARIN SOD (PORK) LOCK FLUSH 100 UNIT/ML IV SOLN
500.0000 [IU] | Freq: Once | INTRAVENOUS | Status: AC | PRN
  Administered 2019-03-01: 500 [IU]
  Filled 2019-03-01: qty 5

## 2019-03-01 MED ORDER — SODIUM CHLORIDE 0.9% FLUSH
10.0000 mL | INTRAVENOUS | Status: DC | PRN
  Administered 2019-03-01: 10 mL
  Filled 2019-03-01: qty 10

## 2019-03-10 ENCOUNTER — Other Ambulatory Visit: Payer: Self-pay

## 2019-03-13 ENCOUNTER — Inpatient Hospital Stay: Payer: Medicare Other

## 2019-03-13 ENCOUNTER — Other Ambulatory Visit: Payer: Self-pay

## 2019-03-13 ENCOUNTER — Encounter: Payer: Self-pay | Admitting: Oncology

## 2019-03-13 ENCOUNTER — Inpatient Hospital Stay (HOSPITAL_BASED_OUTPATIENT_CLINIC_OR_DEPARTMENT_OTHER): Payer: Medicare Other | Admitting: Oncology

## 2019-03-13 VITALS — BP 109/75 | HR 76 | Temp 97.9°F | Wt 199.9 lb

## 2019-03-13 VITALS — BP 110/75 | HR 60 | Temp 97.0°F | Resp 17

## 2019-03-13 DIAGNOSIS — Z5111 Encounter for antineoplastic chemotherapy: Secondary | ICD-10-CM | POA: Diagnosis not present

## 2019-03-13 DIAGNOSIS — Z006 Encounter for examination for normal comparison and control in clinical research program: Secondary | ICD-10-CM | POA: Diagnosis not present

## 2019-03-13 DIAGNOSIS — C184 Malignant neoplasm of transverse colon: Secondary | ICD-10-CM

## 2019-03-13 LAB — URINALYSIS, COMPLETE (UACMP) WITH MICROSCOPIC
Bacteria, UA: NONE SEEN
Bilirubin Urine: NEGATIVE
Glucose, UA: NEGATIVE mg/dL
Hgb urine dipstick: NEGATIVE
Ketones, ur: NEGATIVE mg/dL
Leukocytes,Ua: NEGATIVE
Nitrite: NEGATIVE
Protein, ur: NEGATIVE mg/dL
Specific Gravity, Urine: 1.018 (ref 1.005–1.030)
pH: 5 (ref 5.0–8.0)

## 2019-03-13 LAB — CBC WITH DIFFERENTIAL/PLATELET
Abs Immature Granulocytes: 0.01 10*3/uL (ref 0.00–0.07)
Basophils Absolute: 0 10*3/uL (ref 0.0–0.1)
Basophils Relative: 1 %
Eosinophils Absolute: 0.1 10*3/uL (ref 0.0–0.5)
Eosinophils Relative: 2 %
HCT: 28.5 % — ABNORMAL LOW (ref 36.0–46.0)
Hemoglobin: 9.6 g/dL — ABNORMAL LOW (ref 12.0–15.0)
Immature Granulocytes: 0 %
Lymphocytes Relative: 38 %
Lymphs Abs: 1.8 10*3/uL (ref 0.7–4.0)
MCH: 27.8 pg (ref 26.0–34.0)
MCHC: 33.7 g/dL (ref 30.0–36.0)
MCV: 82.6 fL (ref 80.0–100.0)
Monocytes Absolute: 0.4 10*3/uL (ref 0.1–1.0)
Monocytes Relative: 8 %
Neutro Abs: 2.5 10*3/uL (ref 1.7–7.7)
Neutrophils Relative %: 51 %
Platelets: 152 10*3/uL (ref 150–400)
RBC: 3.45 MIL/uL — ABNORMAL LOW (ref 3.87–5.11)
RDW: 17.6 % — ABNORMAL HIGH (ref 11.5–15.5)
WBC: 4.9 10*3/uL (ref 4.0–10.5)
nRBC: 0 % (ref 0.0–0.2)

## 2019-03-13 LAB — COMPREHENSIVE METABOLIC PANEL
ALT: 13 U/L (ref 0–44)
AST: 17 U/L (ref 15–41)
Albumin: 3.6 g/dL (ref 3.5–5.0)
Alkaline Phosphatase: 67 U/L (ref 38–126)
Anion gap: 7 (ref 5–15)
BUN: 13 mg/dL (ref 8–23)
CO2: 25 mmol/L (ref 22–32)
Calcium: 9.1 mg/dL (ref 8.9–10.3)
Chloride: 111 mmol/L (ref 98–111)
Creatinine, Ser: 1.11 mg/dL — ABNORMAL HIGH (ref 0.44–1.00)
GFR calc Af Amer: 59 mL/min — ABNORMAL LOW (ref >60)
GFR calc non Af Amer: 51 mL/min — ABNORMAL LOW (ref >60)
Glucose, Bld: 84 mg/dL (ref 70–99)
Potassium: 3.6 mmol/L (ref 3.5–5.1)
Sodium: 143 mmol/L (ref 135–145)
Total Bilirubin: 0.3 mg/dL (ref 0.3–1.2)
Total Protein: 6.6 g/dL (ref 6.5–8.1)

## 2019-03-13 MED ORDER — PALONOSETRON HCL INJECTION 0.25 MG/5ML
0.2500 mg | Freq: Once | INTRAVENOUS | Status: AC
  Administered 2019-03-13: 0.25 mg via INTRAVENOUS
  Filled 2019-03-13: qty 5

## 2019-03-13 MED ORDER — FLUOROURACIL CHEMO INJECTION 2.5 GM/50ML ALLIANCE A021502
400.0000 mg/m2 | Freq: Once | INTRAVENOUS | Status: AC
  Administered 2019-03-13: 800 mg via INTRAVENOUS
  Filled 2019-03-13: qty 16

## 2019-03-13 MED ORDER — DIPHENOXYLATE-ATROPINE 2.5-0.025 MG PO TABS: 1.0000 | ORAL_TABLET | Freq: Four times a day (QID) | ORAL | 0 refills | Status: DC | PRN

## 2019-03-13 MED ORDER — DEXTROSE 5 % IV SOLN
Freq: Once | INTRAVENOUS | Status: AC
  Administered 2019-03-13: 12:00:00 via INTRAVENOUS
  Filled 2019-03-13: qty 250

## 2019-03-13 MED ORDER — SODIUM CHLORIDE 0.9 % IV SOLN
Freq: Once | INTRAVENOUS | Status: AC
  Administered 2019-03-13: 10:00:00 via INTRAVENOUS
  Filled 2019-03-13: qty 250

## 2019-03-13 MED ORDER — SODIUM CHLORIDE 0.9 % IV SOLN
840.0000 mg | Freq: Once | INTRAVENOUS | Status: AC
  Administered 2019-03-13: 840 mg via INTRAVENOUS
  Filled 2019-03-13: qty 14

## 2019-03-13 MED ORDER — SODIUM CHLORIDE 0.9% FLUSH
10.0000 mL | INTRAVENOUS | Status: DC | PRN
  Administered 2019-03-13: 10 mL via INTRAVENOUS
  Filled 2019-03-13: qty 10

## 2019-03-13 MED ORDER — SODIUM CHLORIDE 0.9 % IV SOLN
2400.0000 mg/m2 | INTRAVENOUS | Status: DC
  Administered 2019-03-13: 4700 mg via INTRAVENOUS
  Filled 2019-03-13: qty 94

## 2019-03-13 MED ORDER — OXALIPLATIN CHEMO INJECTION 100 MG/20ML FOR ALLIANCE A021502: 85.0000 mg/m2 | Freq: Once | INTRAVENOUS | Status: DC

## 2019-03-13 MED ORDER — OXALIPLATIN CHEMO INJECTION 100 MG/20ML FOR ALLIANCE A021502
130.0000 mg | Freq: Once | INTRAVENOUS | Status: AC
  Administered 2019-03-13: 130 mg via INTRAVENOUS
  Filled 2019-03-13: qty 26

## 2019-03-13 MED ORDER — LEUCOVORIN 350 MG INJECTION FOR ALLIANCE A021502
800.0000 mg | Freq: Once | INTRAVENOUS | Status: AC
  Administered 2019-03-13: 800 mg via INTRAVENOUS
  Filled 2019-03-13: qty 25

## 2019-03-13 MED ORDER — SODIUM CHLORIDE 0.9 % IV SOLN
Freq: Once | INTRAVENOUS | Status: AC
  Administered 2019-03-13: 11:00:00 via INTRAVENOUS
  Filled 2019-03-13: qty 5

## 2019-03-13 NOTE — Patient Instructions (Signed)
Patient can rotate Imodium and Lomotil. Can take 4 mg Imodium at the onset of diarrhea followed by 2 mg Imodium every 2 hours until resolution of diarrhea not to exceed 16 mg per 24 hours.  New prescription for Lomotil 4 times daily as needed for diarrhea. We discussed the role of soluble and insoluble fibers for management and avoidance of diarrhea and constipation.  We discussed that soluble fiber is helpful for both diarrhea and constipation and foods such as foods, oatmeal, applesauce, rice or examples.  We discussed that beans and peas contain significant amounts of soluble and insoluble fiber however some of these can also cause increased gas and pain.    Diarrhea, Adult Diarrhea is when you pass loose and watery poop (stool) often. Diarrhea can make you feel weak and cause you to lose water in your body (get dehydrated). Losing water in your body can cause you to:  Feel tired and thirsty.  Have a dry mouth.  Go pee (urinate) less often. Diarrhea often lasts 2-3 days. However, it can last longer if it is a sign of something more serious. It is important to treat your diarrhea as told by your doctor. Follow these instructions at home: Eating and drinking     Follow these instructions as told by your doctor:  Take an ORS (oral rehydration solution). This is a drink that helps you replace fluids and minerals your body lost. It is sold at pharmacies and stores.  Drink plenty of fluids, such as: ? Water. ? Ice chips. ? Diluted fruit juice. ? Low-calorie sports drinks. ? Milk, if you want.  Avoid drinking fluids that have a lot of sugar or caffeine in them.  Eat bland, easy-to-digest foods in small amounts as you are able. These foods include: ? Bananas. ? Applesauce. ? Rice. ? Low-fat (lean) meats. ? Toast. ? Crackers.  Avoid alcohol.  Avoid spicy or fatty foods.  Medicines  Take over-the-counter and prescription medicines only as told by your doctor.  If you were  prescribed an antibiotic medicine, take it as told by your doctor. Do not stop using the antibiotic even if you start to feel better. General instructions   Wash your hands often using soap and water. If soap and water are not available, use a hand sanitizer. Others in your home should wash their hands as well. Hands should be washed: ? After using the toilet or changing a diaper. ? Before preparing, cooking, or serving food. ? While caring for a sick person. ? While visiting someone in a hospital.  Drink enough fluid to keep your pee (urine) pale yellow.  Rest at home while you get better.  Watch your condition for any changes.  Take a warm bath to help with any burning or pain from having diarrhea.  Keep all follow-up visits as told by your doctor. This is important. Contact a doctor if:  You have a fever.  Your diarrhea gets worse.  You have new symptoms.  You cannot keep fluids down.  You feel light-headed or dizzy.  You have a headache.  You have muscle cramps. Get help right away if:  You have chest pain.  You feel very weak or you pass out (faint).  You have bloody or black poop or poop that looks like tar.  You have very bad pain, cramping, or bloating in your belly (abdomen).  You have trouble breathing or you are breathing very quickly.  Your heart is beating very quickly.  Your skin  feels cold and clammy.  You feel confused.  You have signs of losing too much water in your body, such as: ? Dark pee, very little pee, or no pee. ? Cracked lips. ? Dry mouth. ? Sunken eyes. ? Sleepiness. ? Weakness. Summary  Diarrhea is when you pass loose and watery poop (stool) often.  Diarrhea can make you feel weak and cause you to lose water in your body (get dehydrated).  Take an ORS (oral rehydration solution). This is a drink that is sold at pharmacies and stores.  Eat bland, easy-to-digest foods in small amounts as you are able.  Contact a doctor if  your condition gets worse. Get help right away if you have signs that you have lost too much water in your body. This information is not intended to replace advice given to you by your health care provider. Make sure you discuss any questions you have with your health care provider. Document Released: 12/16/2007 Document Revised: 12/03/2017 Document Reviewed: 12/03/2017 Elsevier Patient Education  2020 [employer].

## 2019-03-13 NOTE — Progress Notes (Signed)
[id number]: Per Ivin Booty RN, Research RN, proceed with scheduled treatment and with oxaliplatin dose reduction.  Pt stable at discharge.

## 2019-03-13 NOTE — Progress Notes (Signed)
Carrie Alvarez  [phone] [phone]  ID: Carrie Alvarez OB: 1950-12-29  MR#: [id number]  BVA#:[id number]  Patient Care Team: Paulene Floor as PCP - General (Physician Assistant) [name], MD as Consulting Physician (Oncology) [name], MD as Attending Physician (Pain Medicine) [name]., MD as Consulting Physician (Ophthalmology) [name], RN as Oncology Nurse Navigator  CHIEF COMPLAINT: Stage IIa ER+ left breast cancer with no breast lesion and axillary lymph node metastasis.  Now with stage IIIa colon cancer.  INTERVAL HISTORY: Patient returns to clinic today for further evaluation and consideration of cycle 3 of FOLFOX plus Tecentriq on clinical trial.  She continues to complain of diarrhea greater than 5 episodes daily.  Having worsening peripheral neuropathy in both hands and feet.  She denies any abdominal pain today.  Her appetite is stable and she denies any weight loss.  She denies any recent fevers or illnesses.  She has no chest pain, shortness of breath, cough or hemoptysis.  She denies any nausea vomiting or constipation.  REVIEW OF SYSTEMS:   Review of Systems  Constitutional: Positive for malaise/fatigue. Negative for chills, fever and weight loss.  HENT: Negative for congestion, ear pain and tinnitus.   Eyes: Negative.  Negative for blurred vision and double vision.  Respiratory: Negative.  Negative for cough, sputum production and shortness of breath.   Cardiovascular: Negative.  Negative for chest pain, palpitations and leg swelling.  Gastrointestinal: Positive for diarrhea. Negative for abdominal pain, constipation, nausea and vomiting.  Genitourinary: Negative for dysuria, frequency and urgency.  Musculoskeletal: Negative for back pain and falls.  Skin: Negative.  Negative for rash.  Neurological: Positive for sensory change and weakness. Negative for headaches.    Endo/Heme/Allergies: Negative.  Does not bruise/bleed easily.  Psychiatric/Behavioral: Negative.  Negative for depression. The patient is not nervous/anxious and does not have insomnia.     As per HPI. Otherwise, a complete review of systems is negative.  PAST MEDICAL HISTORY: Past Medical History:  Diagnosis Date  . Allergy   . Anemia   . Back pain   . Breast cancer ([place]) 2015   LT LUMPECTOMY 12-2014 FOLLOWING CHEMO  . Carpal tunnel syndrome    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  . Cataract   . Colon cancer ([place]) 2020  . Depression   . GERD (gastroesophageal reflux disease)    problem with reflux during chemo  . History of methicillin resistant staphylococcus aureus (MRSA)   . Hypercholesteremia   . Hypertension   . Lumbosacral pain    sees pain management in [place]  . Obesity   . Personal history of chemotherapy 2016  . Pinched nerve    left side, lumbar area  . Uterine cancer ([place]) 1994    PAST SURGICAL HISTORY: Past Surgical History:  Procedure Laterality Date  . ABDOMINAL HYSTERECTOMY  1994   UTERINE CA  . AXILLARY LYMPH NODE BIOPSY Left 12/18/2014   Procedure: AXILLARY LYMPH NODE BIOPSY/;  Surgeon: Robert Bellow, MD;  Location: [facility];  Service: General;  Laterality: Left;  . AXILLARY LYMPH NODE DISSECTION Left 12/18/2014   Procedure: AXILLARY LYMPH NODE DISSECTION;  Surgeon: Robert Bellow, MD;  Location: [facility];  Service: General;  Laterality: Left;  . BREAST BIOPSY Left 05/2014   CORE BX OF LN, METASTATIC ADENOCARCINOMA  . BREAST LUMPECTOMY Left 12/2014   CHEMO FIRST THEN SURGERY OF LN  . BREAST SURGERY     lymph node  removal  . CHOLECYSTECTOMY N/A 04/19/2016   Procedure: LAPAROSCOPIC CHOLECYSTECTOMY WITH INTRAOPERATIVE CHOLANGIOGRAM;  Surgeon: Hubbard Robinson, MD;  Location: [facility];  Service: General;  Laterality: N/A;  . COLON RESECTION Right 01/10/2019   Procedure: HAND ASSISTED LAPAROSCOPIC RIGHT COLON RESECTION;  Surgeon: Jules Husbands, MD;  Location: [facility];  Service: General;  Laterality: Right;  . COLONOSCOPY WITH PROPOFOL N/A 12/30/2018   Procedure: COLONOSCOPY WITH PROPOFOL;  Surgeon: Lucilla Lame, MD;  Location: [place];  Service: Endoscopy;  Laterality: N/A;  . medial branch block  11/06/2015   lumbar facet Dr. Primus Bravo  . POLYPECTOMY N/A 12/30/2018   Procedure: POLYPECTOMY;  Surgeon: Lucilla Lame, MD;  Location: [place];  Service: Endoscopy;  Laterality: N/A;  Clips placed at Hepatic Flexure Polyp (2) and Transverse Colon Polyp (4) removal sites  . PORTACATH PLACEMENT Right 02/02/2019   Procedure: INSERTION PORT-A-CATH;  Surgeon: Jules Husbands, MD;  Location: [facility];  Service: General;  Laterality: Right;  . SENTINEL NODE BIOPSY Left 12/18/2014   Procedure: SENTINEL NODE BIOPSY;  Surgeon: Robert Bellow, MD;  Location: [facility];  Service: General;  Laterality: Left;    FAMILY HISTORY Family History  Problem Relation Age of Onset  . Breast cancer Sister 23  . Colon cancer Sister   . Colon cancer Mother   . Hypertension Mother   . Asthma Mother   . Cancer Father        ADVANCED DIRECTIVES:    HEALTH MAINTENANCE: Social History   Tobacco Use  . Smoking status: Former Smoker    Packs/day: 0.50    Types: Cigarettes    Quit date: 07/18/2014    Years since quitting: 4.6  . Smokeless tobacco: Never Used  Substance Use Topics  . Alcohol use: No    Alcohol/week: 0.0 standard drinks  . Drug use: No    No Known Allergies  Current Outpatient Medications  Medication Sig Dispense Refill  . albuterol (PROVENTIL HFA;VENTOLIN HFA) 108 (90 Base) MCG/ACT inhaler Inhale 2 puffs into the lungs every 6 (six) hours as needed for wheezing or shortness of breath. 1 Inhaler 2  . amLODipine (NORVASC) 10 MG tablet Take 1 tablet (10 mg total) by mouth daily. (Patient taking differently: Take 10 mg by mouth every morning. ) 90 tablet 0  . atorvastatin (LIPITOR) 10 MG tablet Take 1 tablet (10 mg  total) by mouth daily. (Patient taking differently: Take 10 mg by mouth every morning. ) 90 tablet 0  . baclofen (LIORESAL) 10 MG tablet Take 1 tablet (10 mg total) by mouth 2 (two) times daily. (Patient taking differently: Take 10 mg by mouth 3 (three) times daily as needed for muscle spasms. ) 30 each 0  . cholecalciferol (VITAMIN D) 1000 units tablet Take 1,000 Units by mouth daily.    . fluticasone (FLONASE) 50 MCG/ACT nasal spray USE 2 SPRAY(S) IN EACH NOSTRIL AS NEEDED FOR ALLERGIES OR RHINITIS. 16 g 0  . gabapentin (NEURONTIN) 300 MG capsule Take 300-600 mg by mouth 4 (four) times daily as needed (neuropathic pain).     Marland Kitchen HYDROcodone-acetaminophen (NORCO/VICODIN) 5-325 MG tablet Take 1 tablet by mouth every 6 (six) hours as needed for moderate pain. 30 tablet 0  . lidocaine-prilocaine (EMLA) cream Apply to affected area once 30 g 3  . loperamide (IMODIUM) 2 MG capsule Take 45m at onset of diarrhea, then 265mq2h ATC until without diarrhea x 12 hrs. May take 64m764m4h at HS. Max dose 68m12my.  100 capsule 1  . Oxycodone HCl 10 MG TABS LIMIT ONE HALF TO ONE TABLET BY MOUTH 3 TO 5 TIMES DAILY IF TOLERATED    . pyridoxine (B-6) 100 MG tablet Take 100 mg by mouth daily.    . vitamin B-12 (CYANOCOBALAMIN) 500 MCG tablet Take 500 mcg by mouth daily.    Marland Kitchen acetaminophen (TYLENOL) 500 MG tablet Take 500 mg by mouth every 6 (six) hours as needed for mild pain or moderate pain.     Marland Kitchen ondansetron (ZOFRAN ODT) 4 MG disintegrating tablet Take 1 tablet (4 mg total) by mouth every 8 (eight) hours as needed for nausea or vomiting. (Patient not taking: Reported on 02/20/2019) 20 tablet 0  . ondansetron (ZOFRAN) 8 MG tablet Take 1 tablet (8 mg total) by mouth 2 (two) times daily. Start the day after chemo for 2 days. Then take as needed for nausea or vomiting. (Patient not taking: Reported on 02/20/2019) 30 tablet 2  . sertraline (ZOLOFT) 100 MG tablet Take 2 tablets (200 mg total) by mouth daily. (Patient taking  differently: Take 200 mg by mouth at bedtime. ) 180 tablet 0   No current facility-administered medications for this visit.    Facility-Administered Medications Ordered in Other Visits  Medication Dose Route Frequency Provider Last Rate Last Dose  . dextrose 5 % solution   Intravenous Once [name], MD      . fluorouracil ALLIANCE [phone] (ADRUCIL) 4,700 mg in sodium chloride 0.9 % 56 mL chemo infusion  2,400 mg/m2 (Treatment Plan Recorded) Intravenous 1 day or 1 dose [name], MD   4,700 mg at 02/27/19 1448  . fluorouracil ALLIANCE [id number] (ADRUCIL) 4,700 mg in sodium chloride 0.9 % 56 mL chemo infusion  2,400 mg/m2 (Treatment Plan Recorded) Intravenous 1 day or 1 dose [name], MD      . fluorouracil ALLIANCE [phone] (ADRUCIL) chemo injection 800 mg  400 mg/m2 (Treatment Plan Recorded) Intravenous Once [name], MD      . fosaprepitant (EMEND) 150 mg, dexamethasone (DECADRON) 10 mg in sodium chloride 0.9 % 145 mL IVPB   Intravenous Once [name], MD      . heparin lock flush 100 unit/mL  500 Units Intravenous Once [name], MD      . heparin lock flush 100 unit/mL  500 Units Intravenous Once [name], MD      . Derrill Memo ON 03/14/2019] INV-atezolizumab ALLIANCE [id number] (TECENTRIQ) 840 mg in sodium chloride 0.9 % 250 mL chemo infusion  840 mg Intravenous Once [name], MD 528 mL/hr at 03/13/19 1019 840 mg at 03/13/19 1019  . leucovorin ALLIANCE [id number] 800 mg in dextrose 5 % 250 mL infusion  800 mg Intravenous Once [name], MD      . oxaliplatin ALLIANCE [id number] (ELOXATIN) 130 mg in dextrose 5 % 500 mL chemo infusion  130 mg Intravenous Once [name], MD      . palonosetron (ALOXI) injection 0.25 mg  0.25 mg Intravenous Once [name], MD      . sodium chloride flush (NS) 0.9 % injection 10 mL  10 mL Intravenous PRN [name], MD   10 mL at 02/27/19 0925  . sodium chloride  flush (NS) 0.9 % injection 10 mL  10 mL Intravenous PRN [name], MD   10 mL at 03/13/19 0845    OBJECTIVE: Vitals:   03/13/19 0913  BP: 109/75  Pulse: 76  Temp: 97.9 F (36.6 C)     Body mass index is 34.86 kg/m.    ECOG FS:1 - Symptomatic but completely ambulatory  Physical Exam Constitutional:      Appearance: Normal appearance.     Comments: Well appearing women sitting in wheel chair  HENT:     Head: Normocephalic and atraumatic.  Eyes:     Pupils: Pupils are equal, round, and reactive to light.  Neck:     Musculoskeletal: Normal range of motion.  Cardiovascular:     Rate and Rhythm: Normal rate and regular rhythm.     Heart sounds: Normal heart sounds. No murmur.  Pulmonary:     Effort: Pulmonary effort is normal.     Breath sounds: Normal breath sounds. No wheezing.  Abdominal:     General: Bowel sounds are normal. There is no distension.     Palpations: Abdomen is soft.     Tenderness: There is no abdominal tenderness.  Musculoskeletal: Normal range of motion.     Comments: BLE and BLU grade 2 neuropathy.   Skin:    General: Skin is warm and dry.     Findings: No rash.  Neurological:     Mental Status: She is alert and oriented to person, place, and time.  Psychiatric:        Mood and Affect: Mood normal.        Judgment: Judgment normal.     LAB RESULTS:  Lab Results  Component Value Date   NA 143 03/13/2019   K 3.6 03/13/2019   CL 111 03/13/2019   CO2 25 03/13/2019   GLUCOSE 84 03/13/2019   BUN 13 03/13/2019   CREATININE 1.11 (H) 03/13/2019   CALCIUM 9.1 03/13/2019   PROT 6.6 03/13/2019   ALBUMIN 3.6 03/13/2019   AST 17 03/13/2019   ALT 13 03/13/2019   ALKPHOS 67 03/13/2019   BILITOT 0.3 03/13/2019   GFRNONAA 51 (L) 03/13/2019   GFRAA 59 (L) 03/13/2019    Lab Results  Component Value Date   WBC 4.9 03/13/2019   NEUTROABS 2.5 03/13/2019   HGB 9.6 (L) 03/13/2019   HCT 28.5 (L) 03/13/2019   MCV 82.6 03/13/2019   PLT 152  03/13/2019     STUDIES: No results found.  ASSESSMENT: Stage IIa ER+ left breast cancer with no breast lesion and axillary lymph node metastasis. (TxN1M0)  HER-2 negative.  Now with stage IIIa colon cancer.  PLAN:   1.  Stage IIIa colon cancer: Pathology report reviewed independently.  CT scan of her abdomen on January 02, 2019 that did not reveal metastatic disease.  CT scan of the chest on February 07, 2019 also was negative for metastatic disease.  Patient agreed to enroll in clinical trial.  Proceed with cycle 3 of 12 of FOLFOX plus Tecentriq today.  Return to clinic in 2 days for pump removal and then in 2 weeks for further evaluation and consideration of cycle 4.  2. Stage IIa ER+ left breast cancer with no breast lesion and axillary lymph node metastasis: Despite no obvious breast lesion on mammogram or breast MRI, pathology and pattern of spread was consistent with primary breast cancer. CT and bone scan at time of diagnosis revealed no other evidence of malignancy.  She only received 3 cycles of Adriamycin and Cytoxan.  Taxol was discontinued early as well secondary to persistent peripheral neuropathy. Patient completed her chemotherapy on Nov 19, 2014.  She elected not to pursue axillary node dissection given the potential  morbidity of this procedure. It was also elected not to pursue adjuvant XRT given there was no primary breast lesion.  Patient has had approximately 4 years of treatment with letrozole, but this has been discontinued secondary to enrolling in clinical trial for her newly diagnosed colon cancer.  Her most recent mammogram on June 29, 2018 was reported as BI-RADS 2.  Repeat in December 2020.  Continue to monitor throughout treatment for colon cancer. 3.  Bone health: Patient's most recent bone mineral density on February 03, 2018 reported T score of -0.7 which is unchanged from 3 years prior.  Consider repeat bone mineral density in July 2021.  4.  Family history: Patient's sister  reports she has positive for Lynch syndrome, but these results were never confirmed.  Proceed with genetic testing. Scheduled for end of October 2020.  5. Pain/Peripheral neuropathy: Appears to be worsening.  Patient states it is fairly constant at this point.  There is reduce per clinical trial guidelines.  This reductions for oxaliplatin is to reduce from 85 mg/m to 65 mg/m.  Pharmacy aware and reduction made. 6: Anemia: Stable at 9.6 today.  Continue to monitor. 7.  Diarrhea: Greater than 5 daily.  Electrolytes have improved.  Continue Lomotil as prescribed. 8.  Renal insufficiency: Improving, continue to monitor. 9.  Hypokalemia: Improved.  Continue dietary modification.   Patient expressed understanding and was in agreement with this plan. She also understands that She can call clinic at any time with any questions, concerns, or complaints.   Breast cancer metastasized to axillary lymph node   Staging form: Breast, AJCC 7th Edition     Clinical stage from 10/22/2014: Stage Unknown ([place], N1, M0) - Signed by [name], MD on 10/22/2014   Jacquelin Hawking, NP   03/13/2019 10:38 AM

## 2019-03-13 NOTE — Research (Addendum)
 Patient Carrie Alvarez returns to clinic this morning for her C3D1 treatment on the [id number] [name] study. Local labs collected via port-a-cath per protocol. Solicited adverse events were assessed by this RN and patient completed the C2D1 PRO-CTCAE booklet while waiting to see Alvarez. Patient is in a wheelchair this morning, states she is having more difficulty ambulating due to pain in her feet and left leg. Patient scores her pain at an 8 and explains that she has a pinched nerve causing it. [facility] and is taking oxycodone , gabapentin  and muscle relaxants.The patient also has worsening of her neuropathy in her feet and hands, she states it is much worse since the last treatment. She is stumbling some when she walks but denies any falls. She showed Carrie Alvarez her hands where they have started to just wrinkle up occasionally for no reason. [name], Alvarez provided her with skin care ointment and some socks to hopefully help her skin issues.  Carrie consulted with Dr. Jacobo regarding the neuropathy worsening and plan to dose reduce for this treatment to  ( -1 ) dose modification of  65 mg / m2 per protocol  guidelines. She is also having increased fatigue since her last visit, she has to rest after a very short time walking. She is also  PS still 1.  States she is experiencing 4-5 bowel movements daily, and that her stools are loose all the time - again she has been experiencing this since her colectomy on 01/10/2019. She states that she is taking Lomotil  but it isn't stopping it so far. Carrie Alvarez reviewed the protocol for taking lomotil  and imodium  together and included this in her discharge instructions for today to hopefully get the diarrhea under control. Labs reviewed by Carrie Alvarez  and she states she is OK to have her treatment today as labs are actually better than the last time. K + is 3.6 today. She is mildly anemic but her electrolytes are all within normal limits. Serum  creatinine slightly elevated, but essentially the same as baseline. Infusion nurse Amy, RN received report and was provided with an infusion treatment guide informing of the subsequent cycles treatment guide, with C3D1 infusion of Atezolizumab  over 30 minutes with or without pre-meds. Carrie Alvarez was instructed to monitor full VS pre-infusion, and within 30 minutes post infusion. H&P completed by Carrie Alvarez for Dr. Jacobo. Central labs were processed and taken to shipping for pick up by [employer].  Adverse events with grade and attribution as noted below.   Adverse Event Log  Study/Protocol: [id number] [name] Cycle: Cycle 2 Event Grade Onset Date Resolved Date Drug Name Attribution Treatment Comments  Diarrhea 1 10 Jan 2019 13 Mar 2019 n/a Possible Imodium  Lomotil  Patient reports experiencing increased BMs since her colectomy  Abdominal pain 1 01/10/2019  n/a surgery Oxycodone  States occasional shooting pain 3/10  Constipation 0      Denies  Nausea 0      None in 2-3 weeks  Fatigue 2 06/12/2018 approx   ?  Reports no energy in past 6-9 months  Anorexia 1 13 Jul 2008   ?  Per patient only  Cough 0        Dyspnea 0        Fever 0        UTI 0        Palmer-plantar erythrodesia 1 03/13/2019       Total biliruben 0        Nuetrophils  0        Platelets 0        TSH 0      No history of hypothyroidism  Sensory Peripheral Neuropathy 1 11/19/2014 13 Mar 2019  Paclitaxel  gabapentin  Tingling in fingertips and feet bil. since taking Taxol   Hypokalemia 1 02/27/2019 03/13/2019  probable none assymptomatic  Anemia 2 02/27/2019   probable none   Elevated creatinine 1 02/07/2019   unrelated  Elevated at baseline CrCl: 64  Diarrhea 2  13 Mar 2019  5FU/Oxaliplatin  Definite Imodium /Lomotil  Increase in stools since last treatment  Peripheral Sensory Neuropathy 2 13 Mar 2019  5FU/Oxaliplatin  Oxaliplatin  Dose Reduction -1

## 2019-03-15 ENCOUNTER — Other Ambulatory Visit: Payer: Self-pay

## 2019-03-15 ENCOUNTER — Inpatient Hospital Stay: Payer: Medicare Other | Attending: Oncology

## 2019-03-15 VITALS — BP 113/79 | HR 66 | Temp 96.0°F | Resp 18

## 2019-03-15 DIAGNOSIS — Z5111 Encounter for antineoplastic chemotherapy: Secondary | ICD-10-CM | POA: Insufficient documentation

## 2019-03-15 DIAGNOSIS — R1032 Left lower quadrant pain: Secondary | ICD-10-CM | POA: Insufficient documentation

## 2019-03-15 DIAGNOSIS — Z853 Personal history of malignant neoplasm of breast: Secondary | ICD-10-CM | POA: Insufficient documentation

## 2019-03-15 DIAGNOSIS — Z809 Family history of malignant neoplasm, unspecified: Secondary | ICD-10-CM | POA: Insufficient documentation

## 2019-03-15 DIAGNOSIS — G620 Drug-induced polyneuropathy: Secondary | ICD-10-CM | POA: Diagnosis not present

## 2019-03-15 DIAGNOSIS — Z800 Family history of malignant neoplasm of digestive organs: Secondary | ICD-10-CM | POA: Diagnosis not present

## 2019-03-15 DIAGNOSIS — R197 Diarrhea, unspecified: Secondary | ICD-10-CM | POA: Insufficient documentation

## 2019-03-15 DIAGNOSIS — C184 Malignant neoplasm of transverse colon: Secondary | ICD-10-CM

## 2019-03-15 DIAGNOSIS — Z006 Encounter for examination for normal comparison and control in clinical research program: Secondary | ICD-10-CM | POA: Diagnosis present

## 2019-03-15 DIAGNOSIS — T451X5A Adverse effect of antineoplastic and immunosuppressive drugs, initial encounter: Secondary | ICD-10-CM | POA: Insufficient documentation

## 2019-03-15 DIAGNOSIS — R5383 Other fatigue: Secondary | ICD-10-CM | POA: Diagnosis not present

## 2019-03-15 DIAGNOSIS — D649 Anemia, unspecified: Secondary | ICD-10-CM | POA: Insufficient documentation

## 2019-03-15 DIAGNOSIS — Z803 Family history of malignant neoplasm of breast: Secondary | ICD-10-CM | POA: Insufficient documentation

## 2019-03-15 DIAGNOSIS — E876 Hypokalemia: Secondary | ICD-10-CM | POA: Insufficient documentation

## 2019-03-15 DIAGNOSIS — N189 Chronic kidney disease, unspecified: Secondary | ICD-10-CM | POA: Insufficient documentation

## 2019-03-15 DIAGNOSIS — Z79899 Other long term (current) drug therapy: Secondary | ICD-10-CM | POA: Insufficient documentation

## 2019-03-15 DIAGNOSIS — C189 Malignant neoplasm of colon, unspecified: Secondary | ICD-10-CM | POA: Diagnosis present

## 2019-03-15 DIAGNOSIS — Z8542 Personal history of malignant neoplasm of other parts of uterus: Secondary | ICD-10-CM | POA: Diagnosis not present

## 2019-03-15 DIAGNOSIS — R531 Weakness: Secondary | ICD-10-CM | POA: Insufficient documentation

## 2019-03-15 MED ORDER — HEPARIN SOD (PORK) LOCK FLUSH 100 UNIT/ML IV SOLN
500.0000 [IU] | Freq: Once | INTRAVENOUS | Status: AC | PRN
  Administered 2019-03-15: 500 [IU]

## 2019-03-15 MED ORDER — SODIUM CHLORIDE 0.9% FLUSH
10.0000 mL | INTRAVENOUS | Status: DC | PRN
  Administered 2019-03-15: 10 mL
  Filled 2019-03-15: qty 10

## 2019-03-15 MED ORDER — HEPARIN SOD (PORK) LOCK FLUSH 100 UNIT/ML IV SOLN
INTRAVENOUS | Status: AC
  Filled 2019-03-15: qty 5

## 2019-03-17 NOTE — Progress Notes (Signed)
[place]  [phone] [phone]) [phone]  ID: Carrie Alvarez OB: 09/21/1950  MR#: [id number]  JQG#:[id number]  Patient Care Team: Paulene Floor as PCP - General (Physician Assistant) [name], MD as Consulting Physician (Oncology) [name], MD as Attending Physician (Pain Medicine) [name]., MD as Consulting Physician (Ophthalmology) [name], RN as Oncology Nurse Navigator  CHIEF COMPLAINT: Stage IIa ER+ left breast cancer with no breast lesion and axillary lymph node metastasis.  Now with stage IIIa colon cancer.  INTERVAL HISTORY: Patient returns to clinic today for further evaluation and consideration of cycle 4 of FOLFOX plus Tecentriq on clinical trial.  She continues to have chronic weakness and fatigue.  She also has persistent diarrhea.  Her peripheral neuropathy has improved since dose reduction of oxaliplatin.  She has a good appetite and denies weight loss.  She denies any recent fevers or illnesses.  She has no chest pain, shortness of breath, cough, or hemoptysis.  She denies any nausea, vomiting, or constipation.  She has no melena or hematochezia.  She has no urinary complaints.  Patient offers no further specific complaints today.    REVIEW OF SYSTEMS:   Review of Systems  Constitutional: Positive for malaise/fatigue. Negative for fever and weight loss.  Respiratory: Negative.  Negative for cough and shortness of breath.   Cardiovascular: Negative.  Negative for chest pain and leg swelling.  Gastrointestinal: Positive for diarrhea. Negative for abdominal pain, blood in stool, constipation, melena, nausea and vomiting.  Genitourinary: Negative.  Negative for dysuria.  Musculoskeletal: Negative.  Negative for back pain.  Skin: Negative.  Negative for rash.  Neurological: Positive for sensory change and weakness. Negative for dizziness, focal weakness and headaches.  Psychiatric/Behavioral:  Negative.  Negative for depression. The patient is not nervous/anxious.     As per HPI. Otherwise, a complete review of systems is negative.  PAST MEDICAL HISTORY: Past Medical History:  Diagnosis Date  . Allergy   . Anemia   . Back pain   . Breast cancer ([place]) 2015   LT LUMPECTOMY 12-2014 FOLLOWING CHEMO  . Carpal tunnel syndrome    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  . Cataract   . Colon cancer ([place]) 2020  . Depression   . GERD (gastroesophageal reflux disease)    problem with reflux during chemo  . History of methicillin resistant staphylococcus aureus (MRSA)   . Hypercholesteremia   . Hypertension   . Lumbosacral pain    sees pain management in [place]  . Obesity   . Personal history of chemotherapy 2016  . Pinched nerve    left side, lumbar area  . Uterine cancer ([place]) 1994    PAST SURGICAL HISTORY: Past Surgical History:  Procedure Laterality Date  . ABDOMINAL HYSTERECTOMY  1994   UTERINE CA  . AXILLARY LYMPH NODE BIOPSY Left 12/18/2014   Procedure: AXILLARY LYMPH NODE BIOPSY/;  Surgeon: Robert Bellow, MD;  Location: [facility];  Service: General;  Laterality: Left;  . AXILLARY LYMPH NODE DISSECTION Left 12/18/2014   Procedure: AXILLARY LYMPH NODE DISSECTION;  Surgeon: Robert Bellow, MD;  Location: [facility];  Service: General;  Laterality: Left;  . BREAST BIOPSY Left 05/2014   CORE BX OF LN, METASTATIC ADENOCARCINOMA  . BREAST LUMPECTOMY Left 12/2014   CHEMO FIRST THEN SURGERY OF LN  . BREAST SURGERY     lymph node removal  . CHOLECYSTECTOMY N/A 04/19/2016   Procedure: LAPAROSCOPIC CHOLECYSTECTOMY WITH INTRAOPERATIVE  CHOLANGIOGRAM;  Surgeon: Hubbard Robinson, MD;  Location: [facility];  Service: General;  Laterality: N/A;  . COLON RESECTION Right 01/10/2019   Procedure: HAND ASSISTED LAPAROSCOPIC RIGHT COLON RESECTION;  Surgeon: Jules Husbands, MD;  Location: [facility];  Service: General;  Laterality: Right;  . COLONOSCOPY WITH PROPOFOL N/A 12/30/2018    Procedure: COLONOSCOPY WITH PROPOFOL;  Surgeon: Lucilla Lame, MD;  Location: [place];  Service: Endoscopy;  Laterality: N/A;  . medial branch block  11/06/2015   lumbar facet Dr. Primus Bravo  . POLYPECTOMY N/A 12/30/2018   Procedure: POLYPECTOMY;  Surgeon: Lucilla Lame, MD;  Location: [place];  Service: Endoscopy;  Laterality: N/A;  Clips placed at Hepatic Flexure Polyp (2) and Transverse Colon Polyp (4) removal sites  . PORTACATH PLACEMENT Right 02/02/2019   Procedure: INSERTION PORT-A-CATH;  Surgeon: Jules Husbands, MD;  Location: [facility];  Service: General;  Laterality: Right;  . SENTINEL NODE BIOPSY Left 12/18/2014   Procedure: SENTINEL NODE BIOPSY;  Surgeon: Robert Bellow, MD;  Location: [facility];  Service: General;  Laterality: Left;    FAMILY HISTORY Family History  Problem Relation Age of Onset  . Breast cancer Sister 9  . Colon cancer Sister   . Colon cancer Mother   . Hypertension Mother   . Asthma Mother   . Cancer Father        ADVANCED DIRECTIVES:    HEALTH MAINTENANCE: Social History   Tobacco Use  . Smoking status: Former Smoker    Packs/day: 0.50    Types: Cigarettes    Quit date: 07/18/2014    Years since quitting: 4.6  . Smokeless tobacco: Never Used  Substance Use Topics  . Alcohol use: No    Alcohol/week: 0.0 standard drinks  . Drug use: No    No Known Allergies  Current Outpatient Medications  Medication Sig Dispense Refill  . acetaminophen (TYLENOL) 500 MG tablet Take 500 mg by mouth every 6 (six) hours as needed for mild pain or moderate pain.     Marland Kitchen albuterol (PROVENTIL HFA;VENTOLIN HFA) 108 (90 Base) MCG/ACT inhaler Inhale 2 puffs into the lungs every 6 (six) hours as needed for wheezing or shortness of breath. 1 Inhaler 2  . amLODipine (NORVASC) 10 MG tablet Take 1 tablet (10 mg total) by mouth daily. (Patient taking differently: Take 10 mg by mouth every morning. ) 90 tablet 0  . atorvastatin (LIPITOR) 10 MG tablet Take 1  tablet (10 mg total) by mouth daily. (Patient taking differently: Take 10 mg by mouth every morning. ) 90 tablet 0  . baclofen (LIORESAL) 10 MG tablet Take 1 tablet (10 mg total) by mouth 2 (two) times daily. (Patient taking differently: Take 10 mg by mouth 3 (three) times daily as needed for muscle spasms. ) 30 each 0  . cholecalciferol (VITAMIN D) 1000 units tablet Take 1,000 Units by mouth daily.    . diphenoxylate-atropine (LOMOTIL) 2.5-0.025 MG tablet Take 1 tablet by mouth 4 (four) times daily as needed for diarrhea or loose stools. 30 tablet 0  . fluticasone (FLONASE) 50 MCG/ACT nasal spray Place 2 sprays into both nostrils daily. 16 g 0  . gabapentin (NEURONTIN) 300 MG capsule Take 300-600 mg by mouth 4 (four) times daily as needed (neuropathic pain).     Marland Kitchen HYDROcodone-acetaminophen (NORCO/VICODIN) 5-325 MG tablet Take 1 tablet by mouth every 6 (six) hours as needed for moderate pain. 30 tablet 0  . lidocaine-prilocaine (EMLA) cream Apply to affected area once  30 g 3  . Oxycodone HCl 10 MG TABS LIMIT ONE HALF TO ONE TABLET BY MOUTH 3 TO 5 TIMES DAILY IF TOLERATED    . pyridoxine (B-6) 100 MG tablet Take 100 mg by mouth daily.    . sertraline (ZOLOFT) 100 MG tablet Take 2 tablets (200 mg total) by mouth daily. (Patient taking differently: Take 200 mg by mouth at bedtime. ) 180 tablet 0  . vitamin B-12 (CYANOCOBALAMIN) 500 MCG tablet Take 500 mcg by mouth daily.    . ondansetron (ZOFRAN ODT) 4 MG disintegrating tablet Take 1 tablet (4 mg total) by mouth every 8 (eight) hours as needed for nausea or vomiting. (Patient not taking: Reported on 03/24/2019) 20 tablet 0   No current facility-administered medications for this visit.    Facility-Administered Medications Ordered in Other Visits  Medication Dose Route Frequency Provider Last Rate Last Dose  . fluorouracil ALLIANCE [id number] (ADRUCIL) 4,700 mg in sodium chloride 0.9 % 56 mL chemo infusion  2,400 mg/m2 (Treatment Plan Recorded) Intravenous  1 day or 1 dose [name], MD   4,700 mg at 02/27/19 1448  . fluorouracil ALLIANCE [id number] (ADRUCIL) 4,700 mg in sodium chloride 0.9 % 56 mL chemo infusion  2,400 mg/m2 (Treatment Plan Recorded) Intravenous 1 day or 1 dose [name], MD   4,700 mg at 03/27/19 1435  . heparin lock flush 100 unit/mL  500 Units Intravenous Once [name], MD      . heparin lock flush 100 unit/mL  500 Units Intravenous Once [name], MD      . sodium chloride flush (NS) 0.9 % injection 10 mL  10 mL Intravenous PRN [name], MD   10 mL at 02/27/19 0925    OBJECTIVE: Vitals:   03/27/19 0931  BP: 121/84  Pulse: 70  Temp: (!) 96 F (35.6 C)     Body mass index is 34.45 kg/m.    ECOG FS:1 - Symptomatic but completely ambulatory  General: Well-developed, well-nourished, no acute distress. Eyes: Pink conjunctiva, anicteric sclera. HEENT: Normocephalic, moist mucous membranes. Lungs: Clear to auscultation bilaterally. Heart: Regular rate and rhythm. No rubs, murmurs, or gallops. Abdomen: Soft, nontender, nondistended. No organomegaly noted, normoactive bowel sounds. Musculoskeletal: No edema, cyanosis, or clubbing. Neuro: Alert, answering all questions appropriately. Cranial nerves grossly intact. Skin: No rashes or petechiae noted. Psych: Normal affect.  LAB RESULTS:  Lab Results  Component Value Date   NA 142 03/27/2019   K 3.2 (L) 03/27/2019   CL 110 03/27/2019   CO2 24 03/27/2019   GLUCOSE 95 03/27/2019   BUN 10 03/27/2019   CREATININE 1.10 (H) 03/27/2019   CALCIUM 9.3 03/27/2019   PROT 6.7 03/27/2019   ALBUMIN 3.5 03/27/2019   AST 18 03/27/2019   ALT 14 03/27/2019   ALKPHOS 68 03/27/2019   BILITOT 0.5 03/27/2019   GFRNONAA 52 (L) 03/27/2019   GFRAA 60 (L) 03/27/2019    Lab Results  Component Value Date   WBC 3.6 (L) 03/27/2019   NEUTROABS 2.1 03/27/2019   HGB 9.6 (L) 03/27/2019   HCT 28.3 (L) 03/27/2019   MCV 81.8 03/27/2019   PLT  141 (L) 03/27/2019     STUDIES: No results found.  ASSESSMENT: Stage IIa ER+ left breast cancer with no breast lesion and axillary lymph node metastasis. (TxN1M0)  HER-2 negative.  Now with stage IIIa colon cancer.  PLAN:   1.  Stage IIIa colon cancer: Pathology report reviewed independently.  CT scan  of her abdomen on January 02, 2019 that did not reveal metastatic disease.  CT scan of the chest on February 07, 2019 also was negative for metastatic disease.  Patient agreed to enroll in clinical trial.  Proceed with cycle 4 of 12 of FOLFOX plus Tecentriq today.  Return to clinic in 2 days for pump removal and then in 2 weeks for further evaluation and consideration of cycle 5.   2. Stage IIa ER+ left breast cancer with no breast lesion and axillary lymph node metastasis: Despite no obvious breast lesion on mammogram or breast MRI, pathology and pattern of spread was consistent with primary breast cancer. CT and bone scan at time of diagnosis revealed no other evidence of malignancy.  She only received 3 cycles of Adriamycin and Cytoxan.  Taxol was discontinued early as well secondary to persistent peripheral neuropathy. Patient completed her chemotherapy on Nov 19, 2014.  She elected not to pursue axillary node dissection given the potential morbidity of this procedure. It was also elected not to pursue adjuvant XRT given there was no primary breast lesion.  Patient has had approximately 4 years of treatment with letrozole, but this has been discontinued secondary to enrolling in clinical trial for her newly diagnosed colon cancer.  Her most recent mammogram on June 29, 2018 was reported as BI-RADS 2.  Repeat in December 2020.  Continue to monitor throughout treatment for colon cancer. 3.  Bone health: Patient's most recent bone mineral density on February 03, 2018 reported T score of -0.7 which is unchanged from 3 years prior.  Consider repeat bone mineral density in July 2021.  4.  Family history: Patient's  sister reports she has positive for Lynch syndrome, but these results were never confirmed.  Proceed with genetic testing. 5. Pain/Peripheral neuropathy: Improved, monitor. 6. Hot flashes: Patient does not complain of this today.  Letrozole has been discontinued. 7. Anemia: Hemoglobin decreased to 9.6, but relatively stable.  Monitor.   8.  Renal insufficiency: Mild.  Chronic and unchanged. 9.  Hypokalemia: Continue to encourage dietary intake, monitor. 10.  Leukopenia: Mild, monitor.   Patient expressed understanding and was in agreement with this plan. She also understands that She can call clinic at any time with any questions, concerns, or complaints.   Breast cancer metastasized to axillary lymph node   Staging form: Breast, AJCC 7th Edition     Clinical stage from 10/22/2014: Stage Unknown (TX, N1, M0) - Signed by [name], MD on 10/22/2014   [name], MD   03/27/2019 3:25 PM

## 2019-03-21 ENCOUNTER — Other Ambulatory Visit: Payer: Self-pay | Admitting: Oncology

## 2019-03-23 ENCOUNTER — Ambulatory Visit (INDEPENDENT_AMBULATORY_CARE_PROVIDER_SITE_OTHER): Payer: Medicare Other | Admitting: Physician Assistant

## 2019-03-23 ENCOUNTER — Other Ambulatory Visit: Payer: Self-pay

## 2019-03-23 ENCOUNTER — Encounter: Payer: Self-pay | Admitting: Physician Assistant

## 2019-03-23 VITALS — BP 102/76 | HR 78 | Temp 96.0°F | Resp 16 | Wt 195.8 lb

## 2019-03-23 DIAGNOSIS — E7800 Pure hypercholesterolemia, unspecified: Secondary | ICD-10-CM | POA: Diagnosis not present

## 2019-03-23 DIAGNOSIS — J309 Allergic rhinitis, unspecified: Secondary | ICD-10-CM

## 2019-03-23 DIAGNOSIS — I10 Essential (primary) hypertension: Secondary | ICD-10-CM | POA: Diagnosis not present

## 2019-03-23 DIAGNOSIS — C189 Malignant neoplasm of colon, unspecified: Secondary | ICD-10-CM | POA: Diagnosis not present

## 2019-03-23 DIAGNOSIS — Z23 Encounter for immunization: Secondary | ICD-10-CM | POA: Diagnosis not present

## 2019-03-23 MED ORDER — FLUTICASONE PROPIONATE 50 MCG/ACT NA SUSP: 2.0000 | Freq: Every day | NASAL | 0 refills | Status: DC

## 2019-03-23 NOTE — Progress Notes (Signed)
Patient: Carrie Alvarez Female    DOB: 01/26/1951   68 y.o.   MRN: [id number] Visit Date: 03/23/2019  Today's Provider: Trinna Post, PA-C   Chief Complaint  Patient presents with  . Hypertension  . Hyperlipidemia   Subjective:     Patient presenting today for followup of chronic issues. In the interim, she has been diagnosed with Stage III colon cancer and has underwent extended right hemicolectomy with Dr. Dahlia Byes in 01/2019. She has currently completed 1/6 months of chemotherapy. Reports of sister having Lynch syndrome and she is being tested at the end of October. She has now had breast cancer, uterine cancer and colon cancer. She reports frequent loose stool and rectal pain due to the same and frequent wiping.   HPI  Hypertension, follow-up:  BP Readings from Last 3 Encounters:  03/23/19 102/76  03/15/19 113/79  03/13/19 110/75    She was last seen for hypertension 4 months ago.  BP at that visit was . Management changes since that visit include no chagnes. She reports excellent compliance with treatment. She is not having side effects.  She is exercising. She is adherent to low salt diet.   Outside blood pressures are stable. She is experiencing none.  Patient denies chest pain and lower extremity edema.   Cardiovascular risk factors include hypertension.  Use of agents associated with hypertension: none.     Weight trend: stable Wt Readings from Last 3 Encounters:  03/23/19 195 lb 12.8 oz (88.8 kg)  03/13/19 199 lb 14.4 oz (90.7 kg)  02/27/19 198 lb 14.4 oz (90.2 kg)    Current diet: in general, a "healthy" diet    ------------------------------------------------------------------------   Lipid/Cholesterol, Follow-up:   Last seen for this4 months ago.  Management changes since that visit include no changes. . Last Lipid Panel:    Component Value Date/Time   CHOL 170 10/27/2017 0927   TRIG 88 10/27/2017 0927   HDL 68 10/27/2017 0927    CHOLHDL 2.5 10/27/2017 0927   LDLCALC 84 10/27/2017 0927    Risk factors for vascular disease include hypercholesterolemia and hypertension  She reports excellent compliance with treatment. She is not having side effects.  Current symptoms include none and have been stable. Weight trend: stable Prior visit with dietician: no Current diet: in general, a "healthy" diet   Current exercise: walking  Wt Readings from Last 3 Encounters:  03/23/19 195 lb 12.8 oz (88.8 kg)  03/13/19 199 lb 14.4 oz (90.7 kg)  02/27/19 198 lb 14.4 oz (90.2 kg)    -------------------------------------------------------------------  No Known Allergies   Current Outpatient Medications:  .  acetaminophen (TYLENOL) 500 MG tablet, Take 500 mg by mouth every 6 (six) hours as needed for mild pain or moderate pain. , Disp: , Rfl:  .  albuterol (PROVENTIL HFA;VENTOLIN HFA) 108 (90 Base) MCG/ACT inhaler, Inhale 2 puffs into the lungs every 6 (six) hours as needed for wheezing or shortness of breath., Disp: 1 Inhaler, Rfl: 2 .  amLODipine (NORVASC) 10 MG tablet, Take 1 tablet (10 mg total) by mouth daily. (Patient taking differently: Take 10 mg by mouth every morning. ), Disp: 90 tablet, Rfl: 0 .  atorvastatin (LIPITOR) 10 MG tablet, Take 1 tablet (10 mg total) by mouth daily. (Patient taking differently: Take 10 mg by mouth every morning. ), Disp: 90 tablet, Rfl: 0 .  baclofen (LIORESAL) 10 MG tablet, Take 1 tablet (10 mg total) by mouth 2 (two) times daily. (  Patient taking differently: Take 10 mg by mouth 3 (three) times daily as needed for muscle spasms. ), Disp: 30 each, Rfl: 0 .  cholecalciferol (VITAMIN D) 1000 units tablet, Take 1,000 Units by mouth daily., Disp: , Rfl:  .  diphenoxylate-atropine (LOMOTIL) 2.5-0.025 MG tablet, Take 1 tablet by mouth 4 (four) times daily as needed for diarrhea or loose stools., Disp: 30 tablet, Rfl: 0 .  fluticasone (FLONASE) 50 MCG/ACT nasal spray, USE 2 SPRAY(S) IN EACH NOSTRIL  AS NEEDED FOR ALLERGIES OR RHINITIS., Disp: 16 g, Rfl: 0 .  gabapentin (NEURONTIN) 300 MG capsule, Take 300-600 mg by mouth 4 (four) times daily as needed (neuropathic pain). , Disp: , Rfl:  .  HYDROcodone-acetaminophen (NORCO/VICODIN) 5-325 MG tablet, Take 1 tablet by mouth every 6 (six) hours as needed for moderate pain., Disp: 30 tablet, Rfl: 0 .  lidocaine-prilocaine (EMLA) cream, Apply to affected area once, Disp: 30 g, Rfl: 3 .  sertraline (ZOLOFT) 100 MG tablet, Take 2 tablets (200 mg total) by mouth daily. (Patient taking differently: Take 200 mg by mouth at bedtime. ), Disp: 180 tablet, Rfl: 0 .  ondansetron (ZOFRAN ODT) 4 MG disintegrating tablet, Take 1 tablet (4 mg total) by mouth every 8 (eight) hours as needed for nausea or vomiting. (Patient not taking: Reported on 02/20/2019), Disp: 20 tablet, Rfl: 0 .  Oxycodone HCl 10 MG TABS, LIMIT ONE HALF TO ONE TABLET BY MOUTH 3 TO 5 TIMES DAILY IF TOLERATED, Disp: , Rfl:  .  pyridoxine (B-6) 100 MG tablet, Take 100 mg by mouth daily., Disp: , Rfl:  .  vitamin B-12 (CYANOCOBALAMIN) 500 MCG tablet, Take 500 mcg by mouth daily., Disp: , Rfl:  No current facility-administered medications for this visit.   Facility-Administered Medications Ordered in Other Visits:  .  fluorouracil ALLIANCE [id number] (ADRUCIL) 4,700 mg in sodium chloride 0.9 % 56 mL chemo infusion, 2,400 mg/m2 (Treatment Plan Recorded), Intravenous, 1 day or 1 dose, Finnegan, Kathlene November, MD, 4,700 mg at 02/27/19 1448 .  heparin lock flush 100 unit/mL, 500 Units, Intravenous, Once, Finnegan, Kathlene November, MD .  heparin lock flush 100 unit/mL, 500 Units, Intravenous, Once, Finnegan, Kathlene November, MD .  sodium chloride flush (NS) 0.9 % injection 10 mL, 10 mL, Intravenous, PRN, [name], MD, 10 mL at 02/27/19 [id number]  Review of Systems  Constitutional: Negative.   Respiratory: Negative.   Cardiovascular: Negative.     Social History   Tobacco Use  . Smoking status: Former Smoker      Packs/day: 0.50    Types: Cigarettes    Quit date: 07/18/2014    Years since quitting: 4.6  . Smokeless tobacco: Never Used  Substance Use Topics  . Alcohol use: No    Alcohol/week: 0.0 standard drinks      Objective:   BP 102/76 (BP Location: Right Arm, Patient Position: Sitting, Cuff Size: Normal)   Pulse 78   Temp (!) 96 F (35.6 C) (Temporal)   Resp 16   Wt 195 lb 12.8 oz (88.8 kg)   SpO2 99%   BMI 34.14 kg/m  Vitals:   03/23/19 0949  BP: 102/76  Pulse: 78  Resp: 16  Temp: (!) 96 F (35.6 C)  TempSrc: Temporal  SpO2: 99%  Weight: 195 lb 12.8 oz (88.8 kg)  Body mass index is 34.14 kg/m.   Physical Exam Constitutional:      Appearance: Normal appearance.  Cardiovascular:     Rate and Rhythm: Normal rate and  regular rhythm.     Heart sounds: Normal heart sounds.  Pulmonary:     Effort: Pulmonary effort is normal.     Breath sounds: Normal breath sounds.  Skin:    General: Skin is warm and dry.  Neurological:     Mental Status: She is alert and oriented to person, place, and time. Mental status is at baseline.  Psychiatric:        Mood and Affect: Mood normal.        Behavior: Behavior normal.      No results found for any visits on 03/23/19.     Assessment & Plan    1. Essential hypertension  Controlled, encouraged her to push fluids.   2. Hypercholesteremia  Check labs today.   - Lipid Profile  3. Need for influenza vaccination  - Flu Vaccine QUAD High Dose(Fluad)  4. Malignant neoplasm of colon, unspecified part of colon [facility])  She is followed by oncology. Undergoing genetic testing for Lynch syndrome at the end of this month. Counseled diarrhea may be chronic due to partial removal of colon. Suggest preparation H, wet wipes and using bottle to rinse instead of wiping.   5. Allergic rhinitis, unspecified seasonality, unspecified trigger  - fluticasone (FLONASE) 50 MCG/ACT nasal spray; Place 2 sprays into both nostrils daily.   Dispense: 16 g; Refill: 0  The entirety of the information documented in the History of Present Illness, Review of Systems and Physical Exam were personally obtained by me. Portions of this information were initially documented by Lynford Humphrey, CMA and reviewed by me for thoroughness and accuracy.   F/u 9 months for CPE and f/u    Trinna Post, PA-C  [facility]

## 2019-03-23 NOTE — Patient Instructions (Signed)

## 2019-03-24 ENCOUNTER — Telehealth: Payer: Self-pay

## 2019-03-24 ENCOUNTER — Encounter: Payer: Self-pay | Admitting: Oncology

## 2019-03-24 ENCOUNTER — Other Ambulatory Visit: Payer: Self-pay

## 2019-03-24 LAB — LIPID PANEL
Chol/HDL Ratio: 1.8 ratio (ref 0.0–4.4)
Cholesterol, Total: 139 mg/dL (ref 100–199)
HDL: 76 mg/dL (ref >39)
LDL Chol Calc (NIH): 48 mg/dL (ref 0–99)
Triglycerides: 79 mg/dL (ref 0–149)
VLDL Cholesterol Cal: 15 mg/dL (ref 5–40)

## 2019-03-24 NOTE — Telephone Encounter (Signed)
-----   Message from Trinna Post, [place] sent at 03/24/2019 12:45 PM EDT ----- Cholesterol looks great. Continue medication.

## 2019-03-24 NOTE — Progress Notes (Signed)
Patient prescreened no questions or concerns today. 

## 2019-03-24 NOTE — Telephone Encounter (Signed)
[email or web address]

## 2019-03-27 ENCOUNTER — Inpatient Hospital Stay: Payer: Medicare Other

## 2019-03-27 ENCOUNTER — Inpatient Hospital Stay (HOSPITAL_BASED_OUTPATIENT_CLINIC_OR_DEPARTMENT_OTHER): Payer: Medicare Other | Admitting: Oncology

## 2019-03-27 ENCOUNTER — Encounter: Payer: Self-pay | Admitting: *Deleted

## 2019-03-27 ENCOUNTER — Other Ambulatory Visit: Payer: Self-pay

## 2019-03-27 VITALS — BP 135/88 | HR 70 | Temp 96.0°F | Resp 18

## 2019-03-27 VITALS — BP 121/84 | HR 70 | Temp 96.0°F | Wt 197.6 lb

## 2019-03-27 DIAGNOSIS — C184 Malignant neoplasm of transverse colon: Secondary | ICD-10-CM

## 2019-03-27 DIAGNOSIS — Z95828 Presence of other vascular implants and grafts: Secondary | ICD-10-CM

## 2019-03-27 DIAGNOSIS — Z5111 Encounter for antineoplastic chemotherapy: Secondary | ICD-10-CM | POA: Diagnosis not present

## 2019-03-27 LAB — COMPREHENSIVE METABOLIC PANEL
ALT: 14 U/L (ref 0–44)
AST: 18 U/L (ref 15–41)
Albumin: 3.5 g/dL (ref 3.5–5.0)
Alkaline Phosphatase: 68 U/L (ref 38–126)
Anion gap: 8 (ref 5–15)
BUN: 10 mg/dL (ref 8–23)
CO2: 24 mmol/L (ref 22–32)
Calcium: 9.3 mg/dL (ref 8.9–10.3)
Chloride: 110 mmol/L (ref 98–111)
Creatinine, Ser: 1.1 mg/dL — ABNORMAL HIGH (ref 0.44–1.00)
GFR calc Af Amer: 60 mL/min — ABNORMAL LOW (ref >60)
GFR calc non Af Amer: 52 mL/min — ABNORMAL LOW (ref >60)
Glucose, Bld: 95 mg/dL (ref 70–99)
Potassium: 3.2 mmol/L — ABNORMAL LOW (ref 3.5–5.1)
Sodium: 142 mmol/L (ref 135–145)
Total Bilirubin: 0.5 mg/dL (ref 0.3–1.2)
Total Protein: 6.7 g/dL (ref 6.5–8.1)

## 2019-03-27 LAB — CBC WITH DIFFERENTIAL/PLATELET
Abs Immature Granulocytes: 0.01 10*3/uL (ref 0.00–0.07)
Basophils Absolute: 0 10*3/uL (ref 0.0–0.1)
Basophils Relative: 1 %
Eosinophils Absolute: 0.1 10*3/uL (ref 0.0–0.5)
Eosinophils Relative: 1 %
HCT: 28.3 % — ABNORMAL LOW (ref 36.0–46.0)
Hemoglobin: 9.6 g/dL — ABNORMAL LOW (ref 12.0–15.0)
Immature Granulocytes: 0 %
Lymphocytes Relative: 30 %
Lymphs Abs: 1.1 10*3/uL (ref 0.7–4.0)
MCH: 27.7 pg (ref 26.0–34.0)
MCHC: 33.9 g/dL (ref 30.0–36.0)
MCV: 81.8 fL (ref 80.0–100.0)
Monocytes Absolute: 0.4 10*3/uL (ref 0.1–1.0)
Monocytes Relative: 10 %
Neutro Abs: 2.1 10*3/uL (ref 1.7–7.7)
Neutrophils Relative %: 58 %
Platelets: 141 10*3/uL — ABNORMAL LOW (ref 150–400)
RBC: 3.46 MIL/uL — ABNORMAL LOW (ref 3.87–5.11)
RDW: 17.9 % — ABNORMAL HIGH (ref 11.5–15.5)
WBC: 3.6 10*3/uL — ABNORMAL LOW (ref 4.0–10.5)
nRBC: 0 % (ref 0.0–0.2)

## 2019-03-27 MED ORDER — FLUOROURACIL CHEMO INJECTION 2.5 GM/50ML ALLIANCE A021502
400.0000 mg/m2 | Freq: Once | INTRAVENOUS | Status: AC
  Administered 2019-03-27: 800 mg via INTRAVENOUS
  Filled 2019-03-27: qty 16

## 2019-03-27 MED ORDER — PALONOSETRON HCL INJECTION 0.25 MG/5ML
0.2500 mg | Freq: Once | INTRAVENOUS | Status: AC
  Administered 2019-03-27: 0.25 mg via INTRAVENOUS
  Filled 2019-03-27: qty 5

## 2019-03-27 MED ORDER — LEUCOVORIN 350 MG INJECTION FOR ALLIANCE A021502
800.0000 mg | Freq: Once | INTRAVENOUS | Status: AC
  Administered 2019-03-27: 800 mg via INTRAVENOUS
  Filled 2019-03-27: qty 25

## 2019-03-27 MED ORDER — SODIUM CHLORIDE 0.9 % IV SOLN
Freq: Once | INTRAVENOUS | Status: AC
  Administered 2019-03-27: 12:00:00 via INTRAVENOUS
  Filled 2019-03-27: qty 5

## 2019-03-27 MED ORDER — SODIUM CHLORIDE 0.9 % IV SOLN
2400.0000 mg/m2 | INTRAVENOUS | Status: DC
  Administered 2019-03-27: 4700 mg via INTRAVENOUS
  Filled 2019-03-27: qty 94

## 2019-03-27 MED ORDER — OXALIPLATIN CHEMO INJECTION 100 MG/20ML FOR ALLIANCE A021502
130.0000 mg | Freq: Once | INTRAVENOUS | Status: AC
  Administered 2019-03-27: 130 mg via INTRAVENOUS
  Filled 2019-03-27: qty 20

## 2019-03-27 MED ORDER — SODIUM CHLORIDE 0.9 % IV SOLN
Freq: Once | INTRAVENOUS | Status: AC
  Administered 2019-03-27: 11:00:00 via INTRAVENOUS
  Filled 2019-03-27: qty 250

## 2019-03-27 MED ORDER — DEXTROSE 5 % IV SOLN
Freq: Once | INTRAVENOUS | Status: AC
  Administered 2019-03-27: 12:00:00 via INTRAVENOUS
  Filled 2019-03-27: qty 250

## 2019-03-27 MED ORDER — SODIUM CHLORIDE 0.9 % IV SOLN
840.0000 mg | Freq: Once | INTRAVENOUS | Status: AC
  Administered 2019-03-27: 840 mg via INTRAVENOUS
  Filled 2019-03-27: qty 14

## 2019-03-27 MED ORDER — SODIUM CHLORIDE 0.9% FLUSH
10.0000 mL | Freq: Once | INTRAVENOUS | Status: AC
  Administered 2019-03-27: 10 mL via INTRAVENOUS
  Filled 2019-03-27: qty 10

## 2019-03-27 NOTE — Research (Signed)
Patient Carrie Alvarez returns to clinic this morning for her C4D1 treatment on the [id number] [name] study. Local labs were collected via port-a-cath per protocol and CBC and chemistry values are within acceptable range for patient to receive her treatment this morning. Solicited adverse events were assessed by this RN and patient completed the C4 PRO-CTCAE booklet while waiting to see Dr. Grayland Ormond. Patient is in a wheelchair again this morning, and states she is still experiencing a lot of fatigue, and that it does not matter how much rest she gets, she has to push herself to keep moving. Reports her neuropathy has improved a great deal since her last treatment and with the Oxaliplatin dose reduction. Continues to report some tenderness in the palms of her hands and the bottoms of her feet, but states this has also improved a little since her last infusion. Reports having about 4 bowel movements daily - usually after she eats and feels this contributes to her fatigue as well. She does take some imodium in the morning, but states she does not want to take it every 2 hours as suggested on the directions. Her stools are loose all the time - again she has been experiencing this since her colectomy on 01/10/2019 and Dr. Grayland Ormond informed her that the increased stools are likely related to her surgery as well as the chemotherapy. Serum creatinine slightly elevated at 1.10mg /dL, but essentially the same as baseline. Platelet count has decreased Infusion nurse LoriW, RN received report and was provided with an infusion treatment guide informing of the subsequent cycles treatment guide, with C4D1 infusion of Atezolizumab over 30 minutes with or without pre-meds. Cecille Rubin was instructed to monitor full VS pre-infusion, and within 30 minutes post infusion. H&P completed by Dr. Grayland Ormond. Patient's SpO2 was assessed by physician's CMA at 99% prior to infusion.  Adverse events with grade and attribution as noted below.    Adverse Event Log  Study/Protocol: [id number] [name] Cycle: Cycle 3 Event Grade Onset Date Resolved Date Drug Name Attribution Treatment Comments  Diarrhea 1 01/10/2019  n/a surgery Imodium Lomotil Patient reports increased BMs: 4/day since her colectomy  Abdominal pain 1 01/10/2019  n/a surgery Oxycodone States occasional shooting pain 3/10  Constipation 0      Denies  Nausea 0      None in 2-3 weeks  Fatigue 2 06/12/2018 approx   ?  Reports no energy in past 6-9 months  Anorexia 1    ?  States this is her norm for several years  Cough 0        Dyspnea 0        Fever 0        UTI 0        Palmer-plantar erythrodesia 1 03/13/2019       Total biliruben 0        Nuetrophils 0        Platelets 0        TSH 0      No history of hypothyroidism  Sensory Peripheral Neuropathy 2 11/19/2014   Paclitaxel gabapentin Tingling in fingertips and feet bil. since taking Taxol  Hypokalemia 1 03/27/2019 02/27/2019  03/13/2019  probable none K+ 3.2mg /dL assymptomatic  Anemia 2 02/27/2019   probable none   Elevated creatinine 1 02/07/2019   unrelated  Elevated at baseline CrCl: 64  Platelet count decreased 1 03/27/2019   definitely none Plts 141,000 today - decrease r/t FOLFOX  [name], BSN, MHA, OCN 03/27/2019 10:37 AM

## 2019-03-27 NOTE — Telephone Encounter (Signed)
Left message advising pt.  (Per DPR)  Thanks,   -Natilie Krabbenhoft  

## 2019-03-27 NOTE — Progress Notes (Signed)
Patient here today for follow up.   

## 2019-03-29 ENCOUNTER — Other Ambulatory Visit: Payer: Self-pay

## 2019-03-29 ENCOUNTER — Inpatient Hospital Stay: Payer: Medicare Other

## 2019-03-29 VITALS — BP 107/72 | HR 73 | Temp 96.6°F | Resp 20

## 2019-03-29 DIAGNOSIS — Z5111 Encounter for antineoplastic chemotherapy: Secondary | ICD-10-CM | POA: Diagnosis not present

## 2019-03-29 DIAGNOSIS — C184 Malignant neoplasm of transverse colon: Secondary | ICD-10-CM

## 2019-03-29 MED ORDER — SODIUM CHLORIDE 0.9% FLUSH
10.0000 mL | INTRAVENOUS | Status: DC | PRN
  Administered 2019-03-29: 10 mL
  Filled 2019-03-29: qty 10

## 2019-03-29 MED ORDER — HEPARIN SOD (PORK) LOCK FLUSH 100 UNIT/ML IV SOLN
500.0000 [IU] | Freq: Once | INTRAVENOUS | Status: AC | PRN
  Administered 2019-03-29: 500 [IU]
  Filled 2019-03-29: qty 5

## 2019-04-10 ENCOUNTER — Inpatient Hospital Stay: Payer: Medicare Other

## 2019-04-10 ENCOUNTER — Encounter: Payer: Self-pay | Admitting: *Deleted

## 2019-04-10 ENCOUNTER — Other Ambulatory Visit: Payer: Self-pay

## 2019-04-10 ENCOUNTER — Encounter: Payer: Self-pay | Admitting: Oncology

## 2019-04-10 ENCOUNTER — Inpatient Hospital Stay (HOSPITAL_BASED_OUTPATIENT_CLINIC_OR_DEPARTMENT_OTHER): Payer: Medicare Other | Admitting: Oncology

## 2019-04-10 VITALS — BP 114/89 | HR 78 | Temp 97.1°F | Resp 16 | Wt 183.2 lb

## 2019-04-10 DIAGNOSIS — Z006 Encounter for examination for normal comparison and control in clinical research program: Secondary | ICD-10-CM | POA: Diagnosis not present

## 2019-04-10 DIAGNOSIS — C184 Malignant neoplasm of transverse colon: Secondary | ICD-10-CM

## 2019-04-10 DIAGNOSIS — G620 Drug-induced polyneuropathy: Secondary | ICD-10-CM

## 2019-04-10 DIAGNOSIS — D649 Anemia, unspecified: Secondary | ICD-10-CM

## 2019-04-10 DIAGNOSIS — Z853 Personal history of malignant neoplasm of breast: Secondary | ICD-10-CM

## 2019-04-10 DIAGNOSIS — R1032 Left lower quadrant pain: Secondary | ICD-10-CM

## 2019-04-10 DIAGNOSIS — Z9221 Personal history of antineoplastic chemotherapy: Secondary | ICD-10-CM

## 2019-04-10 DIAGNOSIS — R197 Diarrhea, unspecified: Secondary | ICD-10-CM

## 2019-04-10 DIAGNOSIS — Z5111 Encounter for antineoplastic chemotherapy: Secondary | ICD-10-CM | POA: Diagnosis present

## 2019-04-10 DIAGNOSIS — N289 Disorder of kidney and ureter, unspecified: Secondary | ICD-10-CM

## 2019-04-10 DIAGNOSIS — Z8481 Family history of carrier of genetic disease: Secondary | ICD-10-CM

## 2019-04-10 DIAGNOSIS — E876 Hypokalemia: Secondary | ICD-10-CM

## 2019-04-10 DIAGNOSIS — C189 Malignant neoplasm of colon, unspecified: Secondary | ICD-10-CM | POA: Diagnosis present

## 2019-04-10 LAB — URINALYSIS, COMPLETE (UACMP) WITH MICROSCOPIC
Bilirubin Urine: NEGATIVE
Glucose, UA: NEGATIVE mg/dL
Hgb urine dipstick: NEGATIVE
Ketones, ur: NEGATIVE mg/dL
Leukocytes,Ua: NEGATIVE
Nitrite: NEGATIVE
Protein, ur: 30 mg/dL — AB
Specific Gravity, Urine: 1.025 (ref 1.005–1.030)
pH: 5 (ref 5.0–8.0)

## 2019-04-10 LAB — CBC WITH DIFFERENTIAL/PLATELET
Abs Immature Granulocytes: 0.01 10*3/uL (ref 0.00–0.07)
Basophils Absolute: 0 10*3/uL (ref 0.0–0.1)
Basophils Relative: 1 %
Eosinophils Absolute: 0 10*3/uL (ref 0.0–0.5)
Eosinophils Relative: 1 %
HCT: 27.1 % — ABNORMAL LOW (ref 36.0–46.0)
Hemoglobin: 9.1 g/dL — ABNORMAL LOW (ref 12.0–15.0)
Immature Granulocytes: 0 %
Lymphocytes Relative: 26 %
Lymphs Abs: 1.1 10*3/uL (ref 0.7–4.0)
MCH: 27.7 pg (ref 26.0–34.0)
MCHC: 33.6 g/dL (ref 30.0–36.0)
MCV: 82.4 fL (ref 80.0–100.0)
Monocytes Absolute: 0.5 10*3/uL (ref 0.1–1.0)
Monocytes Relative: 11 %
Neutro Abs: 2.6 10*3/uL (ref 1.7–7.7)
Neutrophils Relative %: 61 %
Platelets: 139 10*3/uL — ABNORMAL LOW (ref 150–400)
RBC: 3.29 MIL/uL — ABNORMAL LOW (ref 3.87–5.11)
RDW: 18.6 % — ABNORMAL HIGH (ref 11.5–15.5)
WBC: 4.2 10*3/uL (ref 4.0–10.5)
nRBC: 0 % (ref 0.0–0.2)

## 2019-04-10 LAB — COMPREHENSIVE METABOLIC PANEL
ALT: 15 U/L (ref 0–44)
AST: 18 U/L (ref 15–41)
Albumin: 3.7 g/dL (ref 3.5–5.0)
Alkaline Phosphatase: 75 U/L (ref 38–126)
Anion gap: 8 (ref 5–15)
BUN: 11 mg/dL (ref 8–23)
CO2: 21 mmol/L — ABNORMAL LOW (ref 22–32)
Calcium: 8.9 mg/dL (ref 8.9–10.3)
Chloride: 110 mmol/L (ref 98–111)
Creatinine, Ser: 1.12 mg/dL — ABNORMAL HIGH (ref 0.44–1.00)
GFR calc Af Amer: 58 mL/min — ABNORMAL LOW (ref >60)
GFR calc non Af Amer: 50 mL/min — ABNORMAL LOW (ref >60)
Glucose, Bld: 97 mg/dL (ref 70–99)
Potassium: 3.2 mmol/L — ABNORMAL LOW (ref 3.5–5.1)
Sodium: 139 mmol/L (ref 135–145)
Total Bilirubin: 0.3 mg/dL (ref 0.3–1.2)
Total Protein: 6.7 g/dL (ref 6.5–8.1)

## 2019-04-10 LAB — GASTROINTESTINAL PANEL BY PCR, STOOL (REPLACES STOOL CULTURE)

## 2019-04-10 LAB — C DIFFICILE QUICK SCREEN W PCR REFLEX
C Diff antigen: NEGATIVE
C Diff interpretation: NOT DETECTED
C Diff toxin: NEGATIVE

## 2019-04-10 LAB — TSH: TSH: 1.388 u[IU]/mL (ref 0.350–4.500)

## 2019-04-10 MED ORDER — SODIUM CHLORIDE 0.9% FLUSH
10.0000 mL | Freq: Once | INTRAVENOUS | Status: AC
  Administered 2019-04-10: 10 mL via INTRAVENOUS
  Filled 2019-04-10: qty 10

## 2019-04-10 MED ORDER — PREDNISONE 50 MG PO TABS: ORAL_TABLET | ORAL | 0 refills | Status: DC

## 2019-04-10 MED ORDER — HEPARIN SOD (PORK) LOCK FLUSH 100 UNIT/ML IV SOLN
500.0000 [IU] | Freq: Once | INTRAVENOUS | Status: AC
  Administered 2019-04-10: 500 [IU] via INTRAVENOUS

## 2019-04-10 MED ORDER — POTASSIUM CHLORIDE CRYS ER 20 MEQ PO TBCR: 20.0000 meq | EXTENDED_RELEASE_TABLET | Freq: Two times a day (BID) | ORAL | 0 refills | Status: DC

## 2019-04-10 NOTE — Progress Notes (Signed)
[facility]  [phone] [phone]) [phone]  ID: Norm Salt OB: 1951/06/17  MR#: [id number]  FXO#:[id number]  Patient Care Team: Paulene Floor as PCP - General (Physician Assistant) [name], MD as Consulting Physician (Oncology) [name], MD as Attending Physician (Pain Medicine) [name]., MD as Consulting Physician (Ophthalmology) [name], RN as Oncology Nurse Navigator  CHIEF COMPLAINT: Stage IIa ER+ left breast cancer with no breast lesion and axillary lymph node metastasis.  Now with stage IIIa colon cancer.  INTERVAL HISTORY: Patient returns to clinic today for further evaluation and consideration of cycle 5 of FOLFOX plus Tecentriq on clinical trial.  She continues to have persistent weakness and fatigue, diarrhea and new onset left lower quadrant pain.  Pain started approximately Saturday evening and is constant.  Denies injury. Has tried ibuprofen and heating pack without relief. Unrelated to food intake. Appetite stable.  Peripheral neuropathy continues to improve with the dose reduction of oxaliplatin.  Endorses skin changes to fingertips.  She denies any weight loss.  She denies any recent fevers or illnesses, chest pain, shortness of breath, cough or hemoptysis.  She denies any nausea vomiting or constipation.  Continues to have 4-5 loose stools daily.  Taking Lomotil and Imodium daily.  REVIEW OF SYSTEMS:   Review of Systems  Constitutional: Positive for malaise/fatigue.  Gastrointestinal: Positive for abdominal pain (LLQ) and diarrhea.  Neurological: Positive for weakness.    As per HPI. Otherwise, a complete review of systems is negative.  PAST MEDICAL HISTORY: Past Medical History:  Diagnosis Date  . Allergy   . Anemia   . Back pain   . Breast cancer ([facility]) 2015   LT LUMPECTOMY 12-2014 FOLLOWING CHEMO  . Carpal tunnel syndrome    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  .  Cataract   . Colon cancer ([facility]) 2020  . Depression   . GERD (gastroesophageal reflux disease)    problem with reflux during chemo  . History of methicillin resistant staphylococcus aureus (MRSA)   . Hypercholesteremia   . Hypertension   . Lumbosacral pain    sees pain management in [place]  . Obesity   . Personal history of chemotherapy 2016  . Pinched nerve    left side, lumbar area  . Uterine cancer ([facility]) 1994    PAST SURGICAL HISTORY: Past Surgical History:  Procedure Laterality Date  . ABDOMINAL HYSTERECTOMY  1994   UTERINE CA  . AXILLARY LYMPH NODE BIOPSY Left 12/18/2014   Procedure: AXILLARY LYMPH NODE BIOPSY/;  Surgeon: Robert Bellow, MD;  Location: [facility];  Service: General;  Laterality: Left;  . AXILLARY LYMPH NODE DISSECTION Left 12/18/2014   Procedure: AXILLARY LYMPH NODE DISSECTION;  Surgeon: Robert Bellow, MD;  Location: [facility];  Service: General;  Laterality: Left;  . BREAST BIOPSY Left 05/2014   CORE BX OF LN, METASTATIC ADENOCARCINOMA  . BREAST LUMPECTOMY Left 12/2014   CHEMO FIRST THEN SURGERY OF LN  . BREAST SURGERY     lymph node removal  . CHOLECYSTECTOMY N/A 04/19/2016   Procedure: LAPAROSCOPIC CHOLECYSTECTOMY WITH INTRAOPERATIVE CHOLANGIOGRAM;  Surgeon: Hubbard Robinson, MD;  Location: [facility];  Service: General;  Laterality: N/A;  . COLON RESECTION Right 01/10/2019   Procedure: HAND ASSISTED LAPAROSCOPIC RIGHT COLON RESECTION;  Surgeon: Jules Husbands, MD;  Location: [facility];  Service: General;  Laterality: Right;  . COLONOSCOPY WITH PROPOFOL N/A 12/30/2018   Procedure: COLONOSCOPY WITH PROPOFOL;  Surgeon: Allen Norris,  Darren, MD;  Location: [facility];  Service: Endoscopy;  Laterality: N/A;  . medial branch block  11/06/2015   lumbar facet Dr. Primus Bravo  . POLYPECTOMY N/A 12/30/2018   Procedure: POLYPECTOMY;  Surgeon: Lucilla Lame, MD;  Location: [facility];  Service: Endoscopy;  Laterality: N/A;  Clips placed at Hepatic Flexure Polyp (2)  and Transverse Colon Polyp (4) removal sites  . PORTACATH PLACEMENT Right 02/02/2019   Procedure: INSERTION PORT-A-CATH;  Surgeon: Jules Husbands, MD;  Location: [facility];  Service: General;  Laterality: Right;  . SENTINEL NODE BIOPSY Left 12/18/2014   Procedure: SENTINEL NODE BIOPSY;  Surgeon: Robert Bellow, MD;  Location: [facility];  Service: General;  Laterality: Left;    FAMILY HISTORY Family History  Problem Relation Age of Onset  . Breast cancer Sister 3  . Colon cancer Sister   . Colon cancer Mother   . Hypertension Mother   . Asthma Mother   . Cancer Father        ADVANCED DIRECTIVES:    HEALTH MAINTENANCE: Social History   Tobacco Use  . Smoking status: Former Smoker    Packs/day: 0.50    Types: Cigarettes    Quit date: 07/18/2014    Years since quitting: 4.7  . Smokeless tobacco: Never Used  Substance Use Topics  . Alcohol use: No    Alcohol/week: 0.0 standard drinks  . Drug use: No    No Known Allergies  Current Outpatient Medications  Medication Sig Dispense Refill  . acetaminophen (TYLENOL) 500 MG tablet Take 500 mg by mouth every 6 (six) hours as needed for mild pain or moderate pain.     Marland Kitchen albuterol (PROVENTIL HFA;VENTOLIN HFA) 108 (90 Base) MCG/ACT inhaler Inhale 2 puffs into the lungs every 6 (six) hours as needed for wheezing or shortness of breath. 1 Inhaler 2  . amLODipine (NORVASC) 10 MG tablet Take 1 tablet (10 mg total) by mouth daily. (Patient taking differently: Take 10 mg by mouth every morning. ) 90 tablet 0  . atorvastatin (LIPITOR) 10 MG tablet Take 1 tablet (10 mg total) by mouth daily. (Patient taking differently: Take 10 mg by mouth every morning. ) 90 tablet 0  . baclofen (LIORESAL) 10 MG tablet Take 1 tablet (10 mg total) by mouth 2 (two) times daily. (Patient taking differently: Take 10 mg by mouth 3 (three) times daily as needed for muscle spasms. ) 30 each 0  . cholecalciferol (VITAMIN D) 1000 units tablet Take 1,000 Units by mouth  daily.    . diphenoxylate-atropine (LOMOTIL) 2.5-0.025 MG tablet Take 1 tablet by mouth 4 (four) times daily as needed for diarrhea or loose stools. 30 tablet 0  . fluticasone (FLONASE) 50 MCG/ACT nasal spray Place 2 sprays into both nostrils daily. 16 g 0  . gabapentin (NEURONTIN) 300 MG capsule Take 300-600 mg by mouth 4 (four) times daily as needed (neuropathic pain).     Marland Kitchen HYDROcodone-acetaminophen (NORCO/VICODIN) 5-325 MG tablet Take 1 tablet by mouth every 6 (six) hours as needed for moderate pain. 30 tablet 0  . lidocaine-prilocaine (EMLA) cream Apply to affected area once 30 g 3  . ondansetron (ZOFRAN ODT) 4 MG disintegrating tablet Take 1 tablet (4 mg total) by mouth every 8 (eight) hours as needed for nausea or vomiting. (Patient not taking: Reported on 03/24/2019) 20 tablet 0  . Oxycodone HCl 10 MG TABS LIMIT ONE HALF TO ONE TABLET BY MOUTH 3 TO 5 TIMES DAILY IF TOLERATED    .  pyridoxine (B-6) 100 MG tablet Take 100 mg by mouth daily.    . sertraline (ZOLOFT) 100 MG tablet Take 2 tablets (200 mg total) by mouth daily. (Patient taking differently: Take 200 mg by mouth at bedtime. ) 180 tablet 0  . vitamin B-12 (CYANOCOBALAMIN) 500 MCG tablet Take 500 mcg by mouth daily.     No current facility-administered medications for this visit.    Facility-Administered Medications Ordered in Other Visits  Medication Dose Route Frequency Provider Last Rate Last Dose  . fluorouracil [facility]IANCE [id number] (ADRUCIL) 4,700 mg in sodium chloride 0.9 % 56 mL chemo infusion  2,400 mg/m2 (Treatment Plan Recorded) Intravenous 1 day or 1 dose [name], MD   4,700 mg at 02/27/19 1448  . heparin lock flush 100 unit/mL  500 Units Intravenous Once [name], MD      . heparin lock flush 100 unit/mL  500 Units Intravenous Once [name], MD      . heparin lock flush 100 unit/mL  500 Units Intravenous Once [name], MD      . sodium chloride flush (NS) 0.9 % injection 10 mL  10  mL Intravenous PRN [name], MD   10 mL at 02/27/19 2820    OBJECTIVE: There were no vitals filed for this visit.   There is no height or weight on file to calculate BMI.    ECOG FS:1 - Symptomatic but completely ambulatory  Physical Exam Constitutional:      Comments: Sitting in wheelchair  Abdominal:     General: Abdomen is flat. Bowel sounds are normal.     Palpations: Abdomen is soft.     Tenderness: There is abdominal tenderness (LLQ).  Skin:      Neurological:     Mental Status: She is alert.     Motor: Weakness present.     LAB RESULTS:  Lab Results  Component Value Date   NA 139 04/10/2019   K 3.2 (L) 04/10/2019   CL 110 04/10/2019   CO2 21 (L) 04/10/2019   GLUCOSE 97 04/10/2019   BUN 11 04/10/2019   CREATININE 1.12 (H) 04/10/2019   CALCIUM 8.9 04/10/2019   PROT 6.7 04/10/2019   ALBUMIN 3.7 04/10/2019   AST 18 04/10/2019   ALT 15 04/10/2019   ALKPHOS 75 04/10/2019   BILITOT 0.3 04/10/2019   GFRNONAA 50 (L) 04/10/2019   GFRAA 58 (L) 04/10/2019    Lab Results  Component Value Date   WBC 4.2 04/10/2019   NEUTROABS 2.6 04/10/2019   HGB 9.1 (L) 04/10/2019   HCT 27.1 (L) 04/10/2019   MCV 82.4 04/10/2019   PLT 139 (L) 04/10/2019     STUDIES: No results found.  ASSESSMENT: Stage IIa ER+ left breast cancer with no breast lesion and axillary lymph node metastasis. (TxN1M0)  HER-2 negative.  Now with stage IIIa colon cancer.  PLAN:   1.  Stage IIIa colon cancer: Pathology report reviewed independently.  CT scan of her abdomen on January 02, 2019 that did not reveal metastatic disease.  CT scan of the chest on February 07, 2019 also was negative for metastatic disease.  Patient agreed to enroll in clinical trial.  Hold cycle 5 of FOLFOX plus Tecentriq today.  See discussion below.  Return to clinic in 1 week for reconsideration of cycle 5 FOLFOX plus Tecentriq. 2. Stage IIa ER+ left breast cancer with no breast lesion and axillary lymph node metastasis:  Despite no obvious breast lesion on  mammogram or breast MRI, pathology and pattern of spread was consistent with primary breast cancer. CT and bone scan at time of diagnosis revealed no other evidence of malignancy.  She only received 3 cycles of Adriamycin and Cytoxan.  Taxol was discontinued early as well secondary to persistent peripheral neuropathy. Patient completed her chemotherapy on Nov 19, 2014.  She elected not to pursue axillary node dissection given the potential morbidity of this procedure. It was also elected not to pursue adjuvant XRT given there was no primary breast lesion.  Patient has had approximately 4 years of treatment with letrozole, but this has been discontinued secondary to enrolling in clinical trial for her newly diagnosed colon cancer.  Her most recent mammogram on June 29, 2018 was reported as BI-RADS 2.  Repeat in December 2020.  Continue to monitor throughout treatment for colon cancer. 3.  Bone health: Patient's most recent bone mineral density on February 03, 2018 reported T score of -0.7 which is unchanged from 3 years prior.  Consider repeat bone mineral density in July 2021.  4.  Family history: Patient's sister reports she has positive for Lynch syndrome, but these results were never confirmed.  Proceed with genetic testing. 5. Pain/Peripheral neuropathy: Improved with dose reduction. 6.  Left lower quadrant pain: No obvious deformity or mass on examination.  Unclear etiology.  Very tender to touch during examination.  Musculoskeletal versus infectious versus chemotherapy/immunotherapy induced colitis.  We will collect stool studies today to rule out infection. Samples pending during dictation. Rx 50 mg prednisone daily x7 days.  If no improvement, imaging may be warranted.  Encouraged her to continue Lomotil and Imodium after obtaining stool specimen. 7. Anemia: Hemoglobin improved.  9.1. 8.  Renal insufficiency: Mild.  Chronic and unchanged. 9.  Hypokalemia: Potassium  3.2 today.  Likely due to diarrhea.  Rx 20 mEq potassium twice daily x5 days.  Patient expressed understanding and was in agreement with this plan. She also understands that She can call clinic at any time with any questions, concerns, or complaints.   Breast cancer metastasized to axillary lymph node   Staging form: Breast, AJCC 7th Edition     Clinical stage from 10/22/2014: Stage Unknown (TX, N1, M0) - Signed by [name], MD on 10/22/2014   Jacquelin Hawking, NP   04/10/2019 9:16 AM

## 2019-04-10 NOTE — Research (Addendum)
Patient Carrie Alvarez returns to clinic this morning for her C5D1 treatment on the [id number] [name] study. Local labs were collected via port-a-cath per protocol and CBC and chemistry values are within acceptable range for patient to receive her treatment this morning. VS stable and SpO2 was 100% on room air this morning. Patient is reporting left lower quadrant abdominal pain that is new and states this began on Saturday - 04/08/2019 after she picked something up using poor body mechanics. Reports the pain is OK at present, but when she moves around or ambulates, it increases up to 9/10 in intensity. States this is a sharp, nagging pain, and feels different than the abdominal pain she experienced for several weeks following her surgery. Solicited adverse events were assessed by this RN and patient completed the C5 PRO-CTCAE booklet after seeing the provider, and while waiting for her to consult with Dr. Tasia Catchings regarding whether it would be appropriate to treat the patient given the fact that she has a new onset abdominal pain. Patient is in a wheelchair again this morning, and states she is still experiencing a lot of fatigue, and that it does not matter how much rest she gets, she has to push herself to keep moving. Reports her neuropathy remains improved with the Oxaliplatin dose reduction, but does experience cold sensitivity for about 10 days following the infusion. Stressed to her not to drink any cold liquids and not to touch anything cold unless she wears gloves. Continues to report some tenderness in the palms of her hands and the bottoms of her feet, and her skin has turned dark in these areas. Reports she is still having about 4 -5 bowel movements daily - usually after she eats and feels this contributes to her fatigue as well. Patient's weight is down approximately 14 lbs since her last clinic visit, and while this is more than 7% loss in the past 2 weeks, it only reflect a 5 lb weight loss over her  baseline weight. Still assigned a grade 1 since her weight decreased so much in the past 2 weeks. She does take some imodium in the morning, but states she does not want to take it every 2 hours as suggested on the directions. Her stools are loose all the time, but she has reportedly been experiencing  this since her colectomy on 01/10/2019 and Dr. Grayland Ormond informed her that the increased stools are likely related to her surgery as well as the chemotherapy. Per NP, Carrie Alvarez, we will collect a stool specimen to rule out c-diff and check a GI panel as well. Carrie Alvarez was able to collect the stool specimen while in clinic this morning. The decision was made to hold patient's treatment and re-evaluate her in one week due to the possibility that she could be experiencing some colitis from to the atezulizumab. The patient was prescribed steroids x7 days as well as a potassium supplement. Serum creatinine slightly elevated at 1.12mg /dL, but essentially the same as baseline. Platelet count has decreased to 139,000. Serum potassium was 3.2mg /dL. Patient remains mildly anemic. H&P completed by Carrie Abide, NP and overseen by Dr. Tasia Catchings.  Adverse events with grade and attribution as noted below.   Adverse Event Log  Study/Protocol: [id number] [name] Cycle: Cycle 4 Event Grade Onset Date Resolved Date Drug Name Attribution Treatment Comments  Diarrhea 1 01/10/2019  n/a surgery Imodium Lomotil Patient reports increased BMs: 4/day since her colectomy  Abdominal pain 2 04/08/2019 01/10/2019  04/08/2019 n/a possible Oxycodone Reports new sharp  pain in LLQ 9/10  Constipation 0      Denies  Nausea 0      None in 2-3 weeks  Fatigue 2 06/12/2018 approx   ?  Reports no energy in past 6-9 months  Anorexia 1    ?  States this is her norm for several years  Cough 0        Dyspnea 0        Fever 0        UTI 0        Palmer-plantar erythrodesia 1 03/13/2019     Bilateral palms and anterior fingers discolored and tender   Total biliruben 0        Neutrophils 0        TSH 0      No history of hypothyroidism  Sensory Peripheral Neuropathy 2 11/19/2014   Paclitaxel gabapentin Tingling in fingertips and feet bil. since taking Taxol  Hypokalemia 1 03/27/2019 02/27/2019  03/13/2019  probable none K+ 3.2mg /dL assymptomatic  Anemia 2 02/27/2019   probable none   Elevated creatinine 1 02/07/2019   unrelated  Elevated at baseline CrCl: 64  Platelet count decreased 1 03/27/2019   definitely none Plts 139,000 today - decrease r/t FOLFOX  Possible Colitis 2 04/08/2019   probable Steroids Treatment held 1 week  Weight Loss   1 04/10/2019   probable none 14 lb wt loss in 2 wks approx 7% wt loss, but is <5% from baseline weight    Hypokalemia 1 03/27/2019   unlikely  ? R/t diarrhea  Carrie Alvarez, BSN, MHA, OCN 04/10/2019 10:49 AM

## 2019-04-12 ENCOUNTER — Inpatient Hospital Stay: Payer: Medicare Other

## 2019-04-13 ENCOUNTER — Other Ambulatory Visit: Payer: Self-pay | Admitting: Physician Assistant

## 2019-04-13 DIAGNOSIS — F32A Depression, unspecified: Secondary | ICD-10-CM

## 2019-04-13 DIAGNOSIS — F329 Major depressive disorder, single episode, unspecified: Secondary | ICD-10-CM

## 2019-04-13 DIAGNOSIS — E7800 Pure hypercholesterolemia, unspecified: Secondary | ICD-10-CM

## 2019-04-14 NOTE — Progress Notes (Signed)
No answer, message left on VM

## 2019-04-17 ENCOUNTER — Inpatient Hospital Stay: Payer: Medicare Other

## 2019-04-17 ENCOUNTER — Encounter: Payer: Self-pay | Admitting: *Deleted

## 2019-04-17 ENCOUNTER — Other Ambulatory Visit: Payer: Self-pay

## 2019-04-17 ENCOUNTER — Encounter: Payer: Self-pay | Admitting: Oncology

## 2019-04-17 ENCOUNTER — Inpatient Hospital Stay: Payer: Medicare Other | Attending: Oncology | Admitting: Oncology

## 2019-04-17 VITALS — BP 124/88 | HR 71 | Temp 96.0°F | Resp 19

## 2019-04-17 VITALS — BP 104/76 | HR 101 | Temp 96.5°F | Resp 18 | Wt 190.4 lb

## 2019-04-17 DIAGNOSIS — E860 Dehydration: Secondary | ICD-10-CM | POA: Diagnosis not present

## 2019-04-17 DIAGNOSIS — R42 Dizziness and giddiness: Secondary | ICD-10-CM | POA: Diagnosis not present

## 2019-04-17 DIAGNOSIS — C184 Malignant neoplasm of transverse colon: Secondary | ICD-10-CM

## 2019-04-17 DIAGNOSIS — D649 Anemia, unspecified: Secondary | ICD-10-CM | POA: Insufficient documentation

## 2019-04-17 DIAGNOSIS — Z853 Personal history of malignant neoplasm of breast: Secondary | ICD-10-CM | POA: Insufficient documentation

## 2019-04-17 DIAGNOSIS — Z9221 Personal history of antineoplastic chemotherapy: Secondary | ICD-10-CM | POA: Diagnosis not present

## 2019-04-17 DIAGNOSIS — G620 Drug-induced polyneuropathy: Secondary | ICD-10-CM | POA: Diagnosis not present

## 2019-04-17 DIAGNOSIS — Z006 Encounter for examination for normal comparison and control in clinical research program: Secondary | ICD-10-CM | POA: Insufficient documentation

## 2019-04-17 DIAGNOSIS — D696 Thrombocytopenia, unspecified: Secondary | ICD-10-CM | POA: Diagnosis not present

## 2019-04-17 DIAGNOSIS — Z5111 Encounter for antineoplastic chemotherapy: Secondary | ICD-10-CM | POA: Diagnosis present

## 2019-04-17 DIAGNOSIS — C189 Malignant neoplasm of colon, unspecified: Secondary | ICD-10-CM | POA: Insufficient documentation

## 2019-04-17 DIAGNOSIS — R197 Diarrhea, unspecified: Secondary | ICD-10-CM | POA: Diagnosis not present

## 2019-04-17 LAB — COMPREHENSIVE METABOLIC PANEL
ALT: 20 U/L (ref 0–44)
AST: 21 U/L (ref 15–41)
Albumin: 3.9 g/dL (ref 3.5–5.0)
Alkaline Phosphatase: 73 U/L (ref 38–126)
Anion gap: 14 (ref 5–15)
BUN: 20 mg/dL (ref 8–23)
CO2: 27 mmol/L (ref 22–32)
Calcium: 9.8 mg/dL (ref 8.9–10.3)
Chloride: 102 mmol/L (ref 98–111)
Creatinine, Ser: 1.3 mg/dL — ABNORMAL HIGH (ref 0.44–1.00)
GFR calc Af Amer: 49 mL/min — ABNORMAL LOW (ref >60)
GFR calc non Af Amer: 42 mL/min — ABNORMAL LOW (ref >60)
Glucose, Bld: 99 mg/dL (ref 70–99)
Potassium: 3.5 mmol/L (ref 3.5–5.1)
Sodium: 143 mmol/L (ref 135–145)
Total Bilirubin: 0.7 mg/dL (ref 0.3–1.2)
Total Protein: 7.6 g/dL (ref 6.5–8.1)

## 2019-04-17 LAB — CBC WITH DIFFERENTIAL/PLATELET
Abs Immature Granulocytes: 0.39 10*3/uL — ABNORMAL HIGH (ref 0.00–0.07)
Basophils Absolute: 0 10*3/uL (ref 0.0–0.1)
Basophils Relative: 0 %
Eosinophils Absolute: 0 10*3/uL (ref 0.0–0.5)
Eosinophils Relative: 0 %
HCT: 37.2 % (ref 36.0–46.0)
Hemoglobin: 12.4 g/dL (ref 12.0–15.0)
Immature Granulocytes: 4 %
Lymphocytes Relative: 33 %
Lymphs Abs: 3.3 10*3/uL (ref 0.7–4.0)
MCH: 27.9 pg (ref 26.0–34.0)
MCHC: 33.3 g/dL (ref 30.0–36.0)
MCV: 83.6 fL (ref 80.0–100.0)
Monocytes Absolute: 1.2 10*3/uL — ABNORMAL HIGH (ref 0.1–1.0)
Monocytes Relative: 12 %
Neutro Abs: 5.2 10*3/uL (ref 1.7–7.7)
Neutrophils Relative %: 51 %
Platelets: 261 10*3/uL (ref 150–400)
RBC: 4.45 MIL/uL (ref 3.87–5.11)
RDW: 19 % — ABNORMAL HIGH (ref 11.5–15.5)
WBC: 10.2 10*3/uL (ref 4.0–10.5)
nRBC: 0.2 % (ref 0.0–0.2)

## 2019-04-17 MED ORDER — SODIUM CHLORIDE 0.9 % IV SOLN
2400.0000 mg/m2 | INTRAVENOUS | Status: DC
  Administered 2019-04-17: 4700 mg via INTRAVENOUS
  Filled 2019-04-17: qty 94

## 2019-04-17 MED ORDER — PALONOSETRON HCL INJECTION 0.25 MG/5ML
0.2500 mg | Freq: Once | INTRAVENOUS | Status: AC
  Administered 2019-04-17: 0.25 mg via INTRAVENOUS
  Filled 2019-04-17: qty 5

## 2019-04-17 MED ORDER — FLUOROURACIL CHEMO INJECTION 2.5 GM/50ML ALLIANCE A021502
400.0000 mg/m2 | Freq: Once | INTRAVENOUS | Status: AC
  Administered 2019-04-17: 800 mg via INTRAVENOUS
  Filled 2019-04-17: qty 16

## 2019-04-17 MED ORDER — DIPHENOXYLATE-ATROPINE 2.5-0.025 MG PO TABS: 1.0000 | ORAL_TABLET | Freq: Four times a day (QID) | ORAL | 0 refills | Status: DC | PRN

## 2019-04-17 MED ORDER — SODIUM CHLORIDE 0.9 % IV SOLN
840.0000 mg | Freq: Once | INTRAVENOUS | Status: AC
  Administered 2019-04-17: 840 mg via INTRAVENOUS
  Filled 2019-04-17: qty 14

## 2019-04-17 MED ORDER — DEXTROSE 5 % IV SOLN
Freq: Once | INTRAVENOUS | Status: AC
  Administered 2019-04-17: 12:00:00 via INTRAVENOUS
  Filled 2019-04-17: qty 250

## 2019-04-17 MED ORDER — OXALIPLATIN CHEMO INJECTION 100 MG/20ML FOR ALLIANCE A021502
130.0000 mg | Freq: Once | INTRAVENOUS | Status: AC
  Administered 2019-04-17: 130 mg via INTRAVENOUS
  Filled 2019-04-17: qty 20

## 2019-04-17 MED ORDER — LEUCOVORIN 350 MG INJECTION FOR ALLIANCE A021502
800.0000 mg | Freq: Once | INTRAVENOUS | Status: AC
  Administered 2019-04-17: 800 mg via INTRAVENOUS
  Filled 2019-04-17: qty 40

## 2019-04-17 MED ORDER — SODIUM CHLORIDE 0.9% FLUSH
10.0000 mL | INTRAVENOUS | Status: DC | PRN
  Administered 2019-04-17: 10 mL via INTRAVENOUS
  Filled 2019-04-17: qty 10

## 2019-04-17 MED ORDER — SODIUM CHLORIDE 0.9 % IV SOLN
Freq: Once | INTRAVENOUS | Status: AC
  Administered 2019-04-17: 10:00:00 via INTRAVENOUS
  Filled 2019-04-17: qty 250

## 2019-04-17 MED ORDER — SODIUM CHLORIDE 0.9 % IV SOLN
Freq: Once | INTRAVENOUS | Status: AC
  Administered 2019-04-17: 12:00:00 via INTRAVENOUS
  Filled 2019-04-17: qty 5

## 2019-04-17 MED ORDER — SODIUM CHLORIDE 0.9 % IV SOLN
INTRAVENOUS | Status: DC
  Administered 2019-04-17: 10:00:00 via INTRAVENOUS
  Filled 2019-04-17 (×2): qty 250

## 2019-04-17 NOTE — Research (Signed)
Patient Carrie Alvarez returns to clinic again this morning for her C5D1 treatment on the [id number] [name] study, following treatment being held for one week. Local labs were collected via port-a-cath per protocol and CBC and chemistry values are improved since last week and remain within acceptable range for patient to receive her treatment this morning. VS stable and SpO2 was still 100% on room air this morning. Patient now reports left lower quadrant abdominal pain is completely resolved and states she has been pain free since last Wednesday - 04/12/2019. Patient states she still believes this was just a pilled muscle. Solicited adverse events were assessed by this RN again today. Patient completed the C5 PRO-CTCAE booklet last week after seeing the provider. Patient is in a wheelchair again this morning, and complains with a lot of fatigue, but states she is completely ambulatory and uses a cane while at home. Reports her neuropathy in fingertips and toes is only mild and consists of some tingling and numbness. Informed that she will receive Oxaliplatin again today and stressed to patient not to drink any cold liquids and not to touch anything cold unless she wears gloves. Continues to report some tenderness in the palms of her hands and the bottoms of her feet, and her skin has turned dark in these areas. Reports her diarrhea had resolved until yesterday and states she had 4 loose stools yesterday and has already had 3 loose bowel movements this morning.She does take some imodium in the morning, but states she does not want to take it every 2 hours as suggested on the directions. States she took it once this morning after her first BM. She feels this contributes to her fatigue and has asked the provider to give her some extra IV fluids today to see if this helps. Reports feeling thirsty all of the time and experiencing dry mouth. Patient's weight is back up 7 lbs since last week, which is back around her  baseline weight.   The patient completed her steroids as well as a potassium supplement as instructed. Serum creatinine slightly elevated at 1.30mg /dL, and CrCl is 50mL/min.. Platelet count has recovered to 261,000 and Serum potassium has improved to 3.5mg /dL. Patient's grade 2 anemia has resolved over the past week. H&P completed by Rulon Abide, NP and overseen by Dr. Tasia Catchings. Patient will proceed to Cycle 5 chemotherapy + atezolizumab. Adverse events with grade and attribution as noted below.   Adverse Event Log  Study/Protocol: [id number] [name] Cycle: Cycle 4 Event Grade Onset Date Resolved Date Drug Name Attribution Treatment Comments  Diarrhea 1 01/10/2019  n/a surgery Imodium Lomotil Patient reports increased BMs: 4/day since her colectomy  Abdominal pain 2 04/08/2019 01/10/2019 04/12/2019 04/08/2019 n/a surgery Oxycodone Reports pain resolved  Constipation 0      Denies  Nausea 0      None in 2-3 weeks  Fatigue 2 06/12/2018 approx   ?  Reports no energy in past 6-9 months  Anorexia 1    ?  States this is her norm for several years  Cough 0        Dyspnea 0        Fever 0        UTI 0        Palmer-plantar erythrodesia 1 03/13/2019     Bilateral palms and anterior fingers discolored and tender  Total biliruben 0        Neutrophils 0        TSH 0  No history of hypothyroidism  Sensory Peripheral Neuropathy 2 11/19/2014   Paclitaxel gabapentin Tingling in fingertips and feet bil. since taking Taxol  Hypokalemia 1 03/27/2019 02/27/2019 04/17/2019 03/13/2019  probable none K+ up to 3.5mg /dL today  Anemia 2 02/27/2019 04/17/2019  probable none Hgb 12.4  Elevated creatinine 1 02/07/2019   unrelated  Elevated at baseline CrCl: 47mL/min  Platelet count decreased 1 03/27/2019 04/17/2019  definitely none Plts 261,000 today -   Possible Colitis 2 04/08/2019 04/12/2019  probable Steroids Treatment held 1 week  Weight Loss   1 04/10/2019 04/17/2019  probable none 14 lb wt loss in 2 wks  approx 7% wt loss, but is <5% from baseline weight    Carrie Alvarez, BSN, MHA, OCN 04/17/2019 10:49 AM

## 2019-04-17 NOTE — Progress Notes (Signed)
[place]  [phone] [phone]  ID: Norm Salt OB: 08-25-1950  MR#: [id number]  HCW#:[id number]  Patient Care Team: Paulene Floor as PCP - General (Physician Assistant) [name], MD as Consulting Physician (Oncology) [name], MD as Attending Physician (Pain Medicine) [name]., MD as Consulting Physician (Ophthalmology) [name], RN as Oncology Nurse Navigator  CHIEF COMPLAINT: Stage IIa ER+ left breast cancer with no breast lesion and axillary lymph node metastasis.  Now with stage IIIa colon cancer.  INTERVAL HISTORY: Patient returns to clinic today for further evaluation and reconsideration of cycle 5 of FOLFOX plus Tecentriq on clinical trial.  Treatment was held last week due to left lower quadrant pain and persistent diarrhea.  Diarrhea was negative for C. difficile and GI pathogen.  She was given steroids for 5 days with complete resolution of left lower quadrant pain.  She denied any diarrhea until yesterday evening.  She has had 3 episodes since last night.  She denies any further abdominal pain.  Appetite is stable.  Peripheral neuropathy continues to improve with dose reduction of oxaliplatin.  Endorses skin sensitivity and color changes to bilateral fingertips.  She denies any weight loss, recent fevers or illness, chest pain, shortness of breath, cough or hemoptysis.  She denies any nausea, vomiting or constipation.  She would like a refill of her Lomotil.  REVIEW OF SYSTEMS:   Review of Systems  Constitutional: Positive for malaise/fatigue.  Gastrointestinal: Positive for diarrhea.  Genitourinary: Negative.   Skin:       Fingertip sensitivity/color changes  Neurological: Positive for weakness.    As per HPI. Otherwise, a complete review of systems is negative.  PAST MEDICAL HISTORY: Past Medical History:  Diagnosis Date  . Allergy   . Anemia   . Back pain   . Breast  cancer ([place]) 2015   LT LUMPECTOMY 12-2014 FOLLOWING CHEMO  . Carpal tunnel syndrome    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  . Cataract   . Colon cancer ([place]) 2020  . Depression   . GERD (gastroesophageal reflux disease)    problem with reflux during chemo  . History of methicillin resistant staphylococcus aureus (MRSA)   . Hypercholesteremia   . Hypertension   . Lumbosacral pain    sees pain management in [place]  . Obesity   . Personal history of chemotherapy 2016  . Pinched nerve    left side, lumbar area  . Uterine cancer ([place]) 1994    PAST SURGICAL HISTORY: Past Surgical History:  Procedure Laterality Date  . ABDOMINAL HYSTERECTOMY  1994   UTERINE CA  . AXILLARY LYMPH NODE BIOPSY Left 12/18/2014   Procedure: AXILLARY LYMPH NODE BIOPSY/;  Surgeon: Robert Bellow, MD;  Location: [facility];  Service: General;  Laterality: Left;  . AXILLARY LYMPH NODE DISSECTION Left 12/18/2014   Procedure: AXILLARY LYMPH NODE DISSECTION;  Surgeon: Robert Bellow, MD;  Location: [facility];  Service: General;  Laterality: Left;  . BREAST BIOPSY Left 05/2014   CORE BX OF LN, METASTATIC ADENOCARCINOMA  . BREAST LUMPECTOMY Left 12/2014   CHEMO FIRST THEN SURGERY OF LN  . BREAST SURGERY     lymph node removal  . CHOLECYSTECTOMY N/A 04/19/2016   Procedure: LAPAROSCOPIC CHOLECYSTECTOMY WITH INTRAOPERATIVE CHOLANGIOGRAM;  Surgeon: Hubbard Robinson, MD;  Location: [facility];  Service: General;  Laterality: N/A;  . COLON RESECTION Right 01/10/2019   Procedure: HAND ASSISTED LAPAROSCOPIC RIGHT COLON RESECTION;  Surgeon: Jules Husbands, MD;  Location: [facility];  Service: General;  Laterality: Right;  . COLONOSCOPY WITH PROPOFOL N/A 12/30/2018   Procedure: COLONOSCOPY WITH PROPOFOL;  Surgeon: Lucilla Lame, MD;  Location: [place];  Service: Endoscopy;  Laterality: N/A;  . medial branch block  11/06/2015   lumbar facet Dr. Primus Bravo  . POLYPECTOMY N/A 12/30/2018   Procedure: POLYPECTOMY;   Surgeon: Lucilla Lame, MD;  Location: [place];  Service: Endoscopy;  Laterality: N/A;  Clips placed at Hepatic Flexure Polyp (2) and Transverse Colon Polyp (4) removal sites  . PORTACATH PLACEMENT Right 02/02/2019   Procedure: INSERTION PORT-A-CATH;  Surgeon: Jules Husbands, MD;  Location: [facility];  Service: General;  Laterality: Right;  . SENTINEL NODE BIOPSY Left 12/18/2014   Procedure: SENTINEL NODE BIOPSY;  Surgeon: Robert Bellow, MD;  Location: [facility];  Service: General;  Laterality: Left;    FAMILY HISTORY Family History  Problem Relation Age of Onset  . Breast cancer Sister 37  . Colon cancer Sister   . Colon cancer Mother   . Hypertension Mother   . Asthma Mother   . Cancer Father        ADVANCED DIRECTIVES:    HEALTH MAINTENANCE: Social History   Tobacco Use  . Smoking status: Former Smoker    Packs/day: 0.50    Types: Cigarettes    Quit date: 07/18/2014    Years since quitting: 4.7  . Smokeless tobacco: Never Used  Substance Use Topics  . Alcohol use: No    Alcohol/week: 0.0 standard drinks  . Drug use: No    No Known Allergies  Current Outpatient Medications  Medication Sig Dispense Refill  . acetaminophen (TYLENOL) 500 MG tablet Take 500 mg by mouth every 6 (six) hours as needed for mild pain or moderate pain.     Marland Kitchen albuterol (PROVENTIL HFA;VENTOLIN HFA) 108 (90 Base) MCG/ACT inhaler Inhale 2 puffs into the lungs every 6 (six) hours as needed for wheezing or shortness of breath. 1 Inhaler 2  . amLODipine (NORVASC) 10 MG tablet Take 1 tablet (10 mg total) by mouth daily. (Patient taking differently: Take 10 mg by mouth every morning. ) 90 tablet 0  . atorvastatin (LIPITOR) 10 MG tablet Take 1 tablet (10 mg total) by mouth every morning. 90 tablet 0  . baclofen (LIORESAL) 10 MG tablet Take 1 tablet (10 mg total) by mouth 2 (two) times daily. (Patient taking differently: Take 10 mg by mouth 3 (three) times daily as needed for muscle spasms. ) 30  each 0  . cholecalciferol (VITAMIN D) 1000 units tablet Take 1,000 Units by mouth daily.    . diphenoxylate-atropine (LOMOTIL) 2.5-0.025 MG tablet Take 1 tablet by mouth 4 (four) times daily as needed for diarrhea or loose stools. 30 tablet 0  . fluticasone (FLONASE) 50 MCG/ACT nasal spray Place 2 sprays into both nostrils daily. 16 g 0  . gabapentin (NEURONTIN) 300 MG capsule Take 300-600 mg by mouth 4 (four) times daily as needed (neuropathic pain).     Marland Kitchen lidocaine-prilocaine (EMLA) cream Apply to affected area once 30 g 3  . ondansetron (ZOFRAN ODT) 4 MG disintegrating tablet Take 1 tablet (4 mg total) by mouth every 8 (eight) hours as needed for nausea or vomiting. 20 tablet 0  . Oxycodone HCl 10 MG TABS LIMIT ONE HALF TO ONE TABLET BY MOUTH 3 TO 5 TIMES DAILY IF TOLERATED    . potassium chloride SA (K-DUR) 20 MEQ tablet Take  1 tablet (20 mEq total) by mouth 2 (two) times daily. 20 tablet 0  . predniSONE (DELTASONE) 50 MG tablet Take 1 tab daily for a total of 7 days. 7 tablet 0  . pyridoxine (B-6) 100 MG tablet Take 100 mg by mouth daily.    . sertraline (ZOLOFT) 100 MG tablet Take 2 tablets by mouth once daily 180 tablet 0  . vitamin B-12 (CYANOCOBALAMIN) 500 MCG tablet Take 500 mcg by mouth daily.     No current facility-administered medications for this visit.    Facility-Administered Medications Ordered in Other Visits  Medication Dose Route Frequency Provider Last Rate Last Dose  . 0.9 %  sodium chloride infusion   Intravenous Continuous Faythe Casa E, NP      . dextrose 5 % solution   Intravenous Once [name], MD      . fluorouracil ALLIANCE [phone] (ADRUCIL) 4,700 mg in sodium chloride 0.9 % 56 mL chemo infusion  2,400 mg/m2 (Treatment Plan Recorded) Intravenous 1 day or 1 dose [name], MD   4,700 mg at 02/27/19 1448  . fluorouracil ALLIANCE [id number] (ADRUCIL) 4,700 mg in sodium chloride 0.9 % 56 mL chemo infusion  2,400 mg/m2 (Treatment Plan Recorded)  Intravenous 1 day or 1 dose [name], MD      . fluorouracil ALLIANCE [phone] (ADRUCIL) chemo injection 800 mg  400 mg/m2 (Treatment Plan Recorded) Intravenous Once [name], MD      . fosaprepitant (EMEND) 150 mg, dexamethasone (DECADRON) 10 mg in sodium chloride 0.9 % 145 mL IVPB   Intravenous Once [name], MD      . heparin lock flush 100 unit/mL  500 Units Intravenous Once [name], MD      . heparin lock flush 100 unit/mL  500 Units Intravenous Once [name], MD      . Derrill Memo ON 04/18/2019] INV-atezolizumab ALLIANCE [id number] (TECENTRIQ) 840 mg in sodium chloride 0.9 % 250 mL chemo infusion  840 mg Intravenous Once [name], MD      . leucovorin ALLIANCE [id number] 780 mg in dextrose 5 % 250 mL infusion  400 mg/m2 (Treatment Plan Recorded) Intravenous Once [name], MD      . oxaliplatin ALLIANCE [id number] (ELOXATIN) 125.95 mg in dextrose 5 % 500 mL chemo infusion  64.6 mg/m2 (Treatment Plan Recorded) Intravenous Once [name], MD      . palonosetron (ALOXI) injection 0.25 mg  0.25 mg Intravenous Once [name], MD      . sodium chloride flush (NS) 0.9 % injection 10 mL  10 mL Intravenous PRN [name], MD   10 mL at 02/27/19 0925  . sodium chloride flush (NS) 0.9 % injection 10 mL  10 mL Intravenous PRN [name], MD   10 mL at 04/17/19 0830    OBJECTIVE: Vitals:   04/17/19 0910  BP: 104/76  Pulse: (!) 101  Resp: 18  Temp: (!) 96.5 F (35.8 C)  SpO2: 100%     Body mass index is 33.2 kg/m.    ECOG FS:1 - Symptomatic but completely ambulatory  Physical Exam Vitals signs reviewed.  Cardiovascular:     Rate and Rhythm: Normal rate.  Skin:    General: Skin is warm and dry.     Comments: Fingertip skin changes  Neurological:     Mental Status: She is alert.     LAB RESULTS:  Lab Results  Component Value Date  NA 143 04/17/2019   K 3.5 04/17/2019   CL 102  04/17/2019   CO2 27 04/17/2019   GLUCOSE 99 04/17/2019   BUN 20 04/17/2019   CREATININE 1.30 (H) 04/17/2019   CALCIUM 9.8 04/17/2019   PROT 7.6 04/17/2019   ALBUMIN 3.9 04/17/2019   AST 21 04/17/2019   ALT 20 04/17/2019   ALKPHOS 73 04/17/2019   BILITOT 0.7 04/17/2019   GFRNONAA 42 (L) 04/17/2019   GFRAA 49 (L) 04/17/2019    Lab Results  Component Value Date   WBC 10.2 04/17/2019   NEUTROABS 5.2 04/17/2019   HGB 12.4 04/17/2019   HCT 37.2 04/17/2019   MCV 83.6 04/17/2019   PLT 261 04/17/2019     STUDIES: No results found.  ASSESSMENT: Stage IIa ER+ left breast cancer with no breast lesion and axillary lymph node metastasis. (TxN1M0)  HER-2 negative.  Now with stage IIIa colon cancer.  PLAN:   1.  Stage IIIa colon cancer: Pathology report reviewed independently.  CT scan of her abdomen on January 02, 2019 that did not reveal metastatic disease.  CT scan of the chest on February 07, 2019 also was negative for metastatic disease.  Patient agreed to enroll in clinical trial.  Proceed with cycle 5 of FOLFOX plus Tecentriq today.  Return to clinic in 2 weeks for cycle 6 FOLFOX plus Tecentriq. 2. Stage IIa ER+ left breast cancer with no breast lesion and axillary lymph node metastasis: Despite no obvious breast lesion on mammogram or breast MRI, pathology and pattern of spread was consistent with primary breast cancer. CT and bone scan at time of diagnosis revealed no other evidence of malignancy.  She only received 3 cycles of Adriamycin and Cytoxan.  Taxol was discontinued early as well secondary to persistent peripheral neuropathy. Patient completed her chemotherapy on Nov 19, 2014.  She elected not to pursue axillary node dissection given the potential morbidity of this procedure. It was also elected not to pursue adjuvant XRT given there was no primary breast lesion.  Patient has had approximately 4 years of treatment with letrozole, but this has been discontinued secondary to enrolling in  clinical trial for her newly diagnosed colon cancer.  Her most recent mammogram on June 29, 2018 was reported as BI-RADS 2.  Repeat in December 2020.  Continue to monitor throughout treatment for colon cancer. 3.  Bone health: Patient's most recent bone mineral density on February 03, 2018 reported T score of -0.7 which is unchanged from 3 years prior.  Consider repeat bone mineral density in July 2021.  4.  Family history: Patient's sister reports she has positive for Lynch syndrome, but these results were never confirmed.  Proceed with genetic testing. 5. Pain/Peripheral neuropathy: Improved with dose reduction. 6.  Left lower quadrant pain: Completely resolved with steroids. 7. Anemia: Hemoglobin improved. 12.4. 8.  Renal insufficiency: Mild.  Creatinine 1.30.  Will give half a liter of NaCl today. 9.  Hypokalemia: Potassium improved and is 3.5 today.  Continue potassium supplements given she continues to have diarrhea. 10.  Diarrhea: Resolved until yesterday evening.  Recently ruled out C. difficile and GI pathogen.  Continue Lomotil and Imodium as needed.  Refilled Lomotil.  Patient expressed understanding and was in agreement with this plan. She also understands that She can call clinic at any time with any questions, concerns, or complaints.   Breast cancer metastasized to axillary lymph node   Staging form: Breast, AJCC 7th Edition     Clinical stage from  10/22/2014: Stage Unknown ([place], N1, M0) - Signed by [name], MD on 10/22/2014   Jacquelin Hawking, NP   04/17/2019 10:08 AM

## 2019-04-19 ENCOUNTER — Inpatient Hospital Stay: Payer: Medicare Other

## 2019-04-19 ENCOUNTER — Other Ambulatory Visit: Payer: Self-pay

## 2019-04-19 DIAGNOSIS — C184 Malignant neoplasm of transverse colon: Secondary | ICD-10-CM

## 2019-04-19 DIAGNOSIS — Z5111 Encounter for antineoplastic chemotherapy: Secondary | ICD-10-CM | POA: Diagnosis not present

## 2019-04-19 MED ORDER — HEPARIN SOD (PORK) LOCK FLUSH 100 UNIT/ML IV SOLN
500.0000 [IU] | Freq: Once | INTRAVENOUS | Status: AC | PRN
  Administered 2019-04-19: 500 [IU]
  Filled 2019-04-19: qty 5

## 2019-04-19 MED ORDER — SODIUM CHLORIDE 0.9 % IV SOLN
Freq: Once | INTRAVENOUS | Status: AC
  Administered 2019-04-19: 14:00:00 via INTRAVENOUS
  Filled 2019-04-19: qty 250

## 2019-04-19 MED ORDER — SODIUM CHLORIDE 0.9% FLUSH
10.0000 mL | INTRAVENOUS | Status: DC | PRN
  Filled 2019-04-19: qty 10

## 2019-04-19 NOTE — Progress Notes (Signed)
1340: B/P 78/57. 1346: B/P 86/60. Pt reports "I am week, dizzy and tired". Dr. Grayland Ormond aware. Per Dr. Grayland Ormond, pt to receive one liter NS over one hour. Pt agrees to plan. Pt states that she drinks "lots of fluid", pt unable to state approx how many glasses of water consumed in one day. Pt denies any nausea/vomiting or diaherra.  1510: Pt and VS stable, Per Dr. Grayland Ormond okay to d/c pt home. Pt educated to call clinic if new symptoms arise or symptoms worsen, or to report to ER/call 911 in the case of an emergency. Pt educated to drink 64 oz of water a day. Pt verbalizes understanding. Pt and VS stable at discharge.

## 2019-04-20 LAB — CEA: CEA: 7.1 ng/mL — ABNORMAL HIGH (ref 0.0–4.7)

## 2019-04-27 NOTE — Progress Notes (Signed)
Carrie Alvarez  [phone]) [phone] [phone]) [phone]  ID: Norm Salt OB: [birth year]  MR#: [id number]  WAQ#:[id number]  Patient Care Team: Paulene Floor as PCP - General (Physician Assistant) [name], MD as Consulting Physician (Oncology) [name], MD as Attending Physician (Pain Medicine) [name]., MD as Consulting Physician (Ophthalmology) [name], RN as Oncology Nurse Navigator  CHIEF COMPLAINT: Stage IIa ER+ left breast cancer with no breast lesion and axillary lymph node metastasis.  Now with stage IIIa colon cancer.  INTERVAL HISTORY: Patient returns to clinic today for further evaluation and consideration of cycle 6 of FOLFOX plus Tecentriq on clinical trial.  Cycle 5 was delayed 1 week secondary to increased diarrhea which patient reports is significantly improved.  Her peripheral neuropathy is unchanged.  She continues to have chronic weakness and fatigue.  She has no other neurologic complaints.  She has a good appetite and denies weight loss.  She denies any recent fevers or illnesses.  She has no chest pain, shortness of breath, cough, or hemoptysis.  She denies any nausea, vomiting, or constipation.  She has no melena or hematochezia.  She has no urinary complaints.  Patient offers no further specific complaints today.  REVIEW OF SYSTEMS:   Review of Systems  Constitutional: Positive for malaise/fatigue. Negative for fever and weight loss.  Respiratory: Negative.  Negative for cough and shortness of breath.   Cardiovascular: Negative.  Negative for chest pain and leg swelling.  Gastrointestinal: Positive for diarrhea. Negative for abdominal pain, blood in stool, constipation, melena, nausea and vomiting.  Genitourinary: Negative.  Negative for dysuria.  Musculoskeletal: Negative.  Negative for back pain.  Skin: Negative.  Negative for rash.  Neurological: Positive for sensory change and weakness.  Negative for dizziness, focal weakness and headaches.  Psychiatric/Behavioral: Negative.  Negative for depression. The patient is not nervous/anxious.     As per HPI. Otherwise, a complete review of systems is negative.  PAST MEDICAL HISTORY: Past Medical History:  Diagnosis Date  . Allergy   . Anemia   . Back pain   . Breast cancer ([place]) 2015   LT LUMPECTOMY 12-2014 FOLLOWING CHEMO  . Carpal tunnel syndrome    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  . Cataract   . Colon cancer ([place]) 2020  . Depression   . GERD (gastroesophageal reflux disease)    problem with reflux during chemo  . History of methicillin resistant staphylococcus aureus (MRSA)   . Hypercholesteremia   . Hypertension   . Lumbosacral pain    sees pain management in [place]  . Obesity   . Personal history of chemotherapy 2016  . Pinched nerve    left side, lumbar area  . Uterine cancer ([place]) 1994    PAST SURGICAL HISTORY: Past Surgical History:  Procedure Laterality Date  . ABDOMINAL HYSTERECTOMY  1994   UTERINE CA  . AXILLARY LYMPH NODE BIOPSY Left 12/18/2014   Procedure: AXILLARY LYMPH NODE BIOPSY/;  Surgeon: Robert Bellow, MD;  Location: [facility];  Service: General;  Laterality: Left;  . AXILLARY LYMPH NODE DISSECTION Left 12/18/2014   Procedure: AXILLARY LYMPH NODE DISSECTION;  Surgeon: Robert Bellow, MD;  Location: [facility];  Service: General;  Laterality: Left;  . BREAST BIOPSY Left 05/2014   CORE BX OF LN, METASTATIC ADENOCARCINOMA  . BREAST LUMPECTOMY Left 12/2014   CHEMO FIRST THEN SURGERY OF LN  . BREAST SURGERY     lymph node removal  .  CHOLECYSTECTOMY N/A 04/19/2016   Procedure: LAPAROSCOPIC CHOLECYSTECTOMY WITH INTRAOPERATIVE CHOLANGIOGRAM;  Surgeon: Hubbard Robinson, MD;  Location: [facility];  Service: General;  Laterality: N/A;  . COLON RESECTION Right 01/10/2019   Procedure: HAND ASSISTED LAPAROSCOPIC RIGHT COLON RESECTION;  Surgeon: Jules Husbands, MD;  Location: [facility];   Service: General;  Laterality: Right;  . COLONOSCOPY WITH PROPOFOL N/A 12/30/2018   Procedure: COLONOSCOPY WITH PROPOFOL;  Surgeon: Lucilla Lame, MD;  Location: [facility];  Service: Endoscopy;  Laterality: N/A;  . medial branch block  11/06/2015   lumbar facet Dr. Primus Bravo  . POLYPECTOMY N/A 12/30/2018   Procedure: POLYPECTOMY;  Surgeon: Lucilla Lame, MD;  Location: [facility];  Service: Endoscopy;  Laterality: N/A;  Clips placed at Hepatic Flexure Polyp (2) and Transverse Colon Polyp (4) removal sites  . PORTACATH PLACEMENT Right 02/02/2019   Procedure: INSERTION PORT-A-CATH;  Surgeon: Jules Husbands, MD;  Location: [facility];  Service: General;  Laterality: Right;  . SENTINEL NODE BIOPSY Left 12/18/2014   Procedure: SENTINEL NODE BIOPSY;  Surgeon: Robert Bellow, MD;  Location: [facility];  Service: General;  Laterality: Left;    FAMILY HISTORY Family History  Problem Relation Age of Onset  . Breast cancer Sister 72  . Colon cancer Sister   . Colon cancer Mother   . Hypertension Mother   . Asthma Mother   . Cancer Father        ADVANCED DIRECTIVES:    HEALTH MAINTENANCE: Social History   Tobacco Use  . Smoking status: Former Smoker    Packs/day: 0.50    Types: Cigarettes    Quit date: 07/18/2014    Years since quitting: 4.7  . Smokeless tobacco: Never Used  Substance Use Topics  . Alcohol use: No    Alcohol/week: 0.0 standard drinks  . Drug use: No    No Known Allergies  Current Outpatient Medications  Medication Sig Dispense Refill  . acetaminophen (TYLENOL) 500 MG tablet Take 500 mg by mouth every 6 (six) hours as needed for mild pain or moderate pain.     Marland Kitchen albuterol (PROVENTIL HFA;VENTOLIN HFA) 108 (90 Base) MCG/ACT inhaler Inhale 2 puffs into the lungs every 6 (six) hours as needed for wheezing or shortness of breath. 1 Inhaler 2  . amLODipine (NORVASC) 10 MG tablet Take 1 tablet (10 mg total) by mouth daily. (Patient taking differently: Take 10 mg by  mouth every morning. ) 90 tablet 0  . atorvastatin (LIPITOR) 10 MG tablet Take 1 tablet (10 mg total) by mouth every morning. 90 tablet 0  . baclofen (LIORESAL) 10 MG tablet Take 1 tablet (10 mg total) by mouth 2 (two) times daily. (Patient taking differently: Take 10 mg by mouth 3 (three) times daily as needed for muscle spasms. ) 30 each 0  . cholecalciferol (VITAMIN D) 1000 units tablet Take 1,000 Units by mouth daily.    . diphenoxylate-atropine (LOMOTIL) 2.5-0.025 MG tablet Take 1 tablet by mouth 4 (four) times daily as needed for diarrhea or loose stools. 30 tablet 0  . fluticasone (FLONASE) 50 MCG/ACT nasal spray Place 2 sprays into both nostrils daily. 16 g 0  . gabapentin (NEURONTIN) 300 MG capsule Take 300-600 mg by mouth 4 (four) times daily as needed (neuropathic pain).     Marland Kitchen lidocaine-prilocaine (EMLA) cream Apply to affected area once 30 g 3  . ondansetron (ZOFRAN ODT) 4 MG disintegrating tablet Take 1 tablet (4 mg total) by mouth every 8 (eight) hours  as needed for nausea or vomiting. 20 tablet 0  . Oxycodone HCl 10 MG TABS LIMIT ONE HALF TO ONE TABLET BY MOUTH 3 TO 5 TIMES DAILY IF TOLERATED    . potassium chloride SA (K-DUR) 20 MEQ tablet Take 1 tablet (20 mEq total) by mouth 2 (two) times daily. 20 tablet 0  . predniSONE (DELTASONE) 50 MG tablet Take 1 tab daily for a total of 7 days. 7 tablet 0  . pyridoxine (B-6) 100 MG tablet Take 100 mg by mouth daily.    . sertraline (ZOLOFT) 100 MG tablet Take 2 tablets by mouth once daily 180 tablet 0  . vitamin B-12 (CYANOCOBALAMIN) 500 MCG tablet Take 500 mcg by mouth daily.     No current facility-administered medications for this visit.    Facility-Administered Medications Ordered in Other Visits  Medication Dose Route Frequency Provider Last Rate Last Dose  . fluorouracil ALLIANCE [id number] (ADRUCIL) 4,700 mg in sodium chloride 0.9 % 56 mL chemo infusion  2,400 mg/m2 (Treatment Plan Recorded) Intravenous 1 day or 1 dose [name], MD   4,700 mg at 02/27/19 1448  . fluorouracil ALLIANCE [id number] (ADRUCIL) 4,700 mg in sodium chloride 0.9 % 56 mL chemo infusion  2,400 mg/m2 (Treatment Plan Recorded) Intravenous 1 day or 1 dose [name], MD      . fluorouracil ALLIANCE [id number] (ADRUCIL) chemo injection 800 mg  400 mg/m2 (Treatment Plan Recorded) Intravenous Once [name], MD      . heparin lock flush 100 unit/mL  500 Units Intravenous Once [name], MD      . heparin lock flush 100 unit/mL  500 Units Intravenous Once [name], MD      . leucovorin ALLIANCE [id number] 800 mg in dextrose 5 % 250 mL infusion  800 mg Intravenous Once [name], MD 145 mL/hr at 05/01/19 1228 800 mg at 05/01/19 1228  . oxaliplatin ALLIANCE [id number] (ELOXATIN) 130 mg in dextrose 5 % 500 mL chemo infusion  130 mg Intravenous Once [name], MD 263 mL/hr at 05/01/19 1225 130 mg at 05/01/19 1225  . sodium chloride flush (NS) 0.9 % injection 10 mL  10 mL Intravenous PRN [name], MD   10 mL at 02/27/19 0925    OBJECTIVE: Vitals:   05/01/19 0858  BP: 109/87  Pulse: 80  Temp: (!) 97.5 F (36.4 C)     Body mass index is 34.47 kg/m.    ECOG FS:1 - Symptomatic but completely ambulatory  General: Well-developed, well-nourished, no acute distress.  Sitting in a wheelchair. Eyes: Pink conjunctiva, anicteric sclera. HEENT: Normocephalic, moist mucous membranes. Lungs: Clear to auscultation bilaterally. Heart: Regular rate and rhythm. No rubs, murmurs, or gallops. Abdomen: Soft, nontender, nondistended. No organomegaly noted, normoactive bowel sounds. Musculoskeletal: No edema, cyanosis, or clubbing. Neuro: Alert, answering all questions appropriately. Cranial nerves grossly intact. Skin: No rashes or petechiae noted. Psych: Normal affect.  LAB RESULTS:  Lab Results  Component Value Date   NA 141 05/01/2019   K 3.5 05/01/2019   CL 110 05/01/2019   CO2 24 05/01/2019     GLUCOSE 96 05/01/2019   BUN 17 05/01/2019   CREATININE 1.03 (H) 05/01/2019   CALCIUM 9.5 05/01/2019   PROT 6.9 05/01/2019   ALBUMIN 3.6 05/01/2019   AST 25 05/01/2019   ALT 17 05/01/2019   ALKPHOS 71 05/01/2019   BILITOT 0.5 05/01/2019   GFRNONAA 56 (L) 05/01/2019   GFRAA >60 05/01/2019  Lab Results  Component Value Date   WBC 5.5 05/01/2019   NEUTROABS 3.5 05/01/2019   HGB 10.2 (L) 05/01/2019   HCT 30.0 (L) 05/01/2019   MCV 83.3 05/01/2019   PLT 126 (L) 05/01/2019     STUDIES: No results found.  ASSESSMENT: Stage IIa ER+ left breast cancer with no breast lesion and axillary lymph node metastasis. (TxN1M0)  HER-2 negative.  Now with stage IIIa colon cancer.  PLAN:   1.  Stage IIIa colon cancer: Pathology report reviewed independently.  CT scan of her abdomen on January 02, 2019 that did not reveal metastatic disease.  CT scan of the chest on February 07, 2019 also was negative for metastatic disease.  Patient agreed to enroll in clinical trial.  Proceed with cycle 6 of 12 of FOLFOX plus Tecentriq today.  Return to clinic in 2 days for pump removal and then in 2 weeks for further evaluation and consideration of cycle 7.   2. Stage IIa ER+ left breast cancer with no breast lesion and axillary lymph node metastasis: Despite no obvious breast lesion on mammogram or breast MRI, pathology and pattern of spread was consistent with primary breast cancer. CT and bone scan at time of diagnosis revealed no other evidence of malignancy.  She only received 3 cycles of Adriamycin and Cytoxan. Taxol was discontinued early as well secondary to persistent peripheral neuropathy. Patient completed her chemotherapy on Nov 19, 2014.  She elected not to pursue axillary node dissection given the potential morbidity of this procedure. It was also elected not to pursue adjuvant XRT given there was no primary breast lesion.  Patient completed approximately 4 years of treatment with letrozole, but this has been  discontinued secondary to enrolling in clinical trial for her newly diagnosed colon cancer.  Her most recent mammogram on June 29, 2018 was reported as BI-RADS 2.  Repeat in December 2020.  Continue to monitor throughout treatment for colon cancer. 3.  Bone health: Patient's most recent bone mineral density on February 03, 2018 reported T score of -0.7 which is unchanged from 3 years prior.  Consider repeat bone mineral density in July 2021.  4.  Family history: Patient's sister reports she has positive for Lynch syndrome, but these results were never confirmed.  Proceed with genetic testing. 5. Pain/Peripheral neuropathy: Chronic and unchanged.  Monitor. 6. Hot flashes: Patient does not complain of this today.  Letrozole has been discontinued. 7. Anemia: Patient's hemoglobin is decreased, but relatively stable at 10.2.  Monitor. 8.  Thrombocytopenia: Mild, proceed with treatment. 9.  Renal insufficiency: Essentially resolved, monitor. 10.  Hypokalemia: Resolved. 11.  Leukopenia: Resolved. 12.  Diarrhea: Improved, continue medication current regimen as recommended.  Patient expressed understanding and was in agreement with this plan. She also understands that She can call clinic at any time with any questions, concerns, or complaints.   Breast cancer metastasized to axillary lymph node   Staging form: Breast, AJCC 7th Edition     Clinical stage from 10/22/2014: Stage Unknown (TX, N1, M0) - Signed by [name], MD on 10/22/2014   [name], MD   05/01/2019 1:23 PM

## 2019-04-28 ENCOUNTER — Other Ambulatory Visit: Payer: Self-pay

## 2019-04-28 NOTE — Progress Notes (Signed)
Patient pre screened for office appointment, no questions or concerns today. 

## 2019-05-01 ENCOUNTER — Encounter: Payer: Self-pay | Admitting: *Deleted

## 2019-05-01 ENCOUNTER — Other Ambulatory Visit: Payer: Self-pay

## 2019-05-01 ENCOUNTER — Inpatient Hospital Stay: Payer: Medicare Other

## 2019-05-01 ENCOUNTER — Inpatient Hospital Stay (HOSPITAL_BASED_OUTPATIENT_CLINIC_OR_DEPARTMENT_OTHER): Payer: Medicare Other | Admitting: Oncology

## 2019-05-01 VITALS — BP 109/87 | HR 80 | Temp 97.5°F | Wt 197.7 lb

## 2019-05-01 VITALS — BP 118/82 | HR 70 | Temp 97.0°F | Resp 20

## 2019-05-01 DIAGNOSIS — C184 Malignant neoplasm of transverse colon: Secondary | ICD-10-CM

## 2019-05-01 DIAGNOSIS — Z5111 Encounter for antineoplastic chemotherapy: Secondary | ICD-10-CM | POA: Diagnosis not present

## 2019-05-01 DIAGNOSIS — Z006 Encounter for examination for normal comparison and control in clinical research program: Secondary | ICD-10-CM | POA: Diagnosis not present

## 2019-05-01 LAB — CBC WITH DIFFERENTIAL/PLATELET
Abs Immature Granulocytes: 0.02 10*3/uL (ref 0.00–0.07)
Basophils Absolute: 0 10*3/uL (ref 0.0–0.1)
Basophils Relative: 0 %
Eosinophils Absolute: 0.1 10*3/uL (ref 0.0–0.5)
Eosinophils Relative: 1 %
HCT: 30 % — ABNORMAL LOW (ref 36.0–46.0)
Hemoglobin: 10.2 g/dL — ABNORMAL LOW (ref 12.0–15.0)
Immature Granulocytes: 0 %
Lymphocytes Relative: 27 %
Lymphs Abs: 1.5 10*3/uL (ref 0.7–4.0)
MCH: 28.3 pg (ref 26.0–34.0)
MCHC: 34 g/dL (ref 30.0–36.0)
MCV: 83.3 fL (ref 80.0–100.0)
Monocytes Absolute: 0.4 10*3/uL (ref 0.1–1.0)
Monocytes Relative: 8 %
Neutro Abs: 3.5 10*3/uL (ref 1.7–7.7)
Neutrophils Relative %: 64 %
Platelets: 126 10*3/uL — ABNORMAL LOW (ref 150–400)
RBC: 3.6 MIL/uL — ABNORMAL LOW (ref 3.87–5.11)
RDW: 19.7 % — ABNORMAL HIGH (ref 11.5–15.5)
WBC: 5.5 10*3/uL (ref 4.0–10.5)
nRBC: 0 % (ref 0.0–0.2)

## 2019-05-01 LAB — COMPREHENSIVE METABOLIC PANEL
ALT: 17 U/L (ref 0–44)
AST: 25 U/L (ref 15–41)
Albumin: 3.6 g/dL (ref 3.5–5.0)
Alkaline Phosphatase: 71 U/L (ref 38–126)
Anion gap: 7 (ref 5–15)
BUN: 17 mg/dL (ref 8–23)
CO2: 24 mmol/L (ref 22–32)
Calcium: 9.5 mg/dL (ref 8.9–10.3)
Chloride: 110 mmol/L (ref 98–111)
Creatinine, Ser: 1.03 mg/dL — ABNORMAL HIGH (ref 0.44–1.00)
GFR calc Af Amer: >60 mL/min (ref >60)
GFR calc non Af Amer: 56 mL/min — ABNORMAL LOW (ref >60)
Glucose, Bld: 96 mg/dL (ref 70–99)
Potassium: 3.5 mmol/L (ref 3.5–5.1)
Sodium: 141 mmol/L (ref 135–145)
Total Bilirubin: 0.5 mg/dL (ref 0.3–1.2)
Total Protein: 6.9 g/dL (ref 6.5–8.1)

## 2019-05-01 MED ORDER — SODIUM CHLORIDE 0.9 % IV SOLN
2400.0000 mg/m2 | INTRAVENOUS | Status: DC
  Administered 2019-05-01: 4700 mg via INTRAVENOUS
  Filled 2019-05-01: qty 94

## 2019-05-01 MED ORDER — FLUOROURACIL CHEMO INJECTION 2.5 GM/50ML ALLIANCE A021502
400.0000 mg/m2 | Freq: Once | INTRAVENOUS | Status: AC
  Administered 2019-05-01: 800 mg via INTRAVENOUS
  Filled 2019-05-01: qty 16

## 2019-05-01 MED ORDER — OXALIPLATIN CHEMO INJECTION 100 MG/20ML FOR ALLIANCE A021502: 64.6000 mg/m2 | Freq: Once | INTRAVENOUS | Status: DC

## 2019-05-01 MED ORDER — SODIUM CHLORIDE 0.9 % IV SOLN
Freq: Once | INTRAVENOUS | Status: AC
  Administered 2019-05-01: 12:00:00 via INTRAVENOUS
  Filled 2019-05-01: qty 5

## 2019-05-01 MED ORDER — LEUCOVORIN 350 MG INJECTION FOR ALLIANCE A021502
800.0000 mg | Freq: Once | INTRAVENOUS | Status: AC
  Administered 2019-05-01: 800 mg via INTRAVENOUS
  Filled 2019-05-01: qty 40

## 2019-05-01 MED ORDER — DEXTROSE 5 % IV SOLN
Freq: Once | INTRAVENOUS | Status: AC
  Administered 2019-05-01: 12:00:00 via INTRAVENOUS
  Filled 2019-05-01: qty 250

## 2019-05-01 MED ORDER — PALONOSETRON HCL INJECTION 0.25 MG/5ML
0.2500 mg | Freq: Once | INTRAVENOUS | Status: AC
  Administered 2019-05-01: 0.25 mg via INTRAVENOUS
  Filled 2019-05-01: qty 5

## 2019-05-01 MED ORDER — SODIUM CHLORIDE 0.9 % IV SOLN
840.0000 mg | Freq: Once | INTRAVENOUS | Status: AC
  Administered 2019-05-01: 840 mg via INTRAVENOUS
  Filled 2019-05-01: qty 14

## 2019-05-01 MED ORDER — OXALIPLATIN CHEMO INJECTION 100 MG/20ML FOR ALLIANCE A021502
130.0000 mg | Freq: Once | INTRAVENOUS | Status: AC
  Administered 2019-05-01: 130 mg via INTRAVENOUS
  Filled 2019-05-01: qty 20

## 2019-05-01 MED ORDER — SODIUM CHLORIDE 0.9 % IV SOLN
Freq: Once | INTRAVENOUS | Status: AC
  Administered 2019-05-01: 10:00:00 via INTRAVENOUS
  Filled 2019-05-01: qty 250

## 2019-05-01 NOTE — Research (Signed)
Patient Carrie Alvarez returns to clinic again this morning for her C6D1 treatment on the [id number] [name] study. Local labs were collected via port-a-cath per protocol and CBC and chemistry values were reviewed by Dr. Grayland Ormond and remain within acceptable range for patient to receive her treatment this morning. VS stable and SpO2 was 98% on room air this morning with patient wearing a mask. Patient reports previous abdominal pain has completely resolved and states she has remained pain free since last Wednesday - 04/12/2019. She also reports her diarrhea has improved some and she is now only experiencing 3 solid/loose stools per day - usually after she eats something. Solicited adverse events were assessed by this RN again today. Patient completed the C6 PRO-CTCAE booklet last week after seeing the provider. Patient remains in a wheelchair while in clinic due to her ongoing issues with fatigue, but states she is completely ambulatory and uses a cane while at home. Reports her neuropathy in fingertips and toes is only mild and consists of some tingling and numbness. She also continues to report some tenderness in the palms of her hands and the bottoms of her feet, and her skin has turned dark in these areas. Patient's weight is back around her baseline weight. CEA collected on 04/19/2019 and it was slightly elevated at 7.1ng/mL. Serum creatinine slightly improved today at at 1.03mg /dL, and CrCl is 71mL/min.. Platelet count has decreased to 126,000 and Serum potassium remains 3.5mg /dL. Patient's anemia is at grade I with Hgb at 10.2g/dL following her chemotherapy 2 weeks ago. H&P completed by Dr. Grayland Ormond and patient will proceed to Cycle 6 chemotherapy + atezolizumab. Adverse events with grade and attribution as noted below.   Adverse Event Log  Study/Protocol: [id number] [name] Cycle: Cycle 5 Event Grade Onset Date Resolved Date Drug Name Attribution Treatment Comments  Diarrhea 1 01/10/2019  n/a surgery  Imodium Lomotil Patient reports improved:3 stools/day   Abdominal pain 2 04/08/2019 01/10/2019 04/12/2019 04/08/2019 n/a surgery Oxycodone Reports pain resolved  Constipation 0      Denies  Nausea 0      None in 2-3 weeks  Fatigue 2 06/12/2018 approx   ?  Reports no energy in past 6-9 months  Anorexia 1    ?  States this is her norm for several years  Cough 0        Dyspnea 0        Fever 0        UTI 0        Palmer-plantar erythrodesia 1 03/13/2019    otc cream Bilateral palms and anterior fingers discolored and tender  Total biliruben 0        Neutrophils 0        TSH 0      No history of hypothyroidism  Sensory Peripheral Neuropathy 2 11/19/2014   Paclitaxel gabapentin Tingling in fingertips and feet bil. since taking Taxol  Hypokalemia 1 03/27/2019 02/27/2019 04/17/2019 03/13/2019  probable none K+ up to 3.5mg /dL today  Anemia 1 05/01/2019     Hgb 10.2  Anemia 2 02/27/2019 04/17/2019  probable none Hgb 12.4  Elevated creatinine 1 02/07/2019   unrelated  Elevated at baseline CrCl: 10mL/min  Platelet count decreased 1 03/27/2019 04/17/2019  definitely none Plts 261,000 today -   Possible Colitis 2 04/08/2019 04/12/2019  probable Steroids Resolved  Weight Loss 1 04/10/2019 04/17/2019  probable none Weight back to normal    [name], BSN, MHA, OCN 05/01/2019 9:49 AM

## 2019-05-02 ENCOUNTER — Telehealth: Payer: Self-pay | Admitting: Licensed Clinical Social Worker

## 2019-05-03 ENCOUNTER — Inpatient Hospital Stay: Payer: Medicare Other

## 2019-05-03 ENCOUNTER — Other Ambulatory Visit: Payer: Self-pay

## 2019-05-03 VITALS — BP 124/82 | HR 76 | Temp 97.8°F | Resp 20

## 2019-05-03 DIAGNOSIS — Z5111 Encounter for antineoplastic chemotherapy: Secondary | ICD-10-CM | POA: Diagnosis not present

## 2019-05-03 DIAGNOSIS — C184 Malignant neoplasm of transverse colon: Secondary | ICD-10-CM

## 2019-05-03 MED ORDER — HEPARIN SOD (PORK) LOCK FLUSH 100 UNIT/ML IV SOLN
500.0000 [IU] | Freq: Once | INTRAVENOUS | Status: AC | PRN
  Administered 2019-05-03: 500 [IU]

## 2019-05-08 NOTE — Telephone Encounter (Signed)
Confirmed genetic counseling appointment on Thursday 10/29 at 9 am with Ms. Magistro. She would prefer a walk-in visit.

## 2019-05-11 ENCOUNTER — Inpatient Hospital Stay: Payer: Medicare Other

## 2019-05-11 ENCOUNTER — Other Ambulatory Visit: Payer: Self-pay

## 2019-05-11 ENCOUNTER — Inpatient Hospital Stay (HOSPITAL_BASED_OUTPATIENT_CLINIC_OR_DEPARTMENT_OTHER): Payer: Medicare Other | Admitting: Licensed Clinical Social Worker

## 2019-05-11 ENCOUNTER — Encounter: Payer: Self-pay | Admitting: Licensed Clinical Social Worker

## 2019-05-11 DIAGNOSIS — Z800 Family history of malignant neoplasm of digestive organs: Secondary | ICD-10-CM

## 2019-05-11 DIAGNOSIS — Z803 Family history of malignant neoplasm of breast: Secondary | ICD-10-CM

## 2019-05-11 DIAGNOSIS — C773 Secondary and unspecified malignant neoplasm of axilla and upper limb lymph nodes: Secondary | ICD-10-CM | POA: Diagnosis not present

## 2019-05-11 DIAGNOSIS — C184 Malignant neoplasm of transverse colon: Secondary | ICD-10-CM | POA: Diagnosis not present

## 2019-05-11 DIAGNOSIS — C55 Malignant neoplasm of uterus, part unspecified: Secondary | ICD-10-CM

## 2019-05-11 DIAGNOSIS — C50912 Malignant neoplasm of unspecified site of left female breast: Secondary | ICD-10-CM

## 2019-05-11 NOTE — Progress Notes (Signed)
REFERRING PROVIDER: [name], MD [address] [place],  [place] [address]  PRIMARY PROVIDER:  Trinna Post, PA-C  PRIMARY REASON FOR VISIT:  1. Malignant neoplasm of uterus, unspecified site ([place])   2. Family history of breast cancer   3. Family history of colon cancer   4. Carcinoma of left breast metastatic to axillary lymph node ([place])   5. Malignant neoplasm of transverse colon ([place])      HISTORY OF PRESENT ILLNESS:   Carrie Alvarez, a 68 y.o. female, was seen for a [facility] cancer genetics consultation at the request of Dr. Grayland Ormond due to a personal and family history of cancer.  Carrie Alvarez presents to clinic today to discuss the possibility of a hereditary predisposition to cancer, genetic testing, and to further clarify her future cancer risks, as well as potential cancer risks for family members.   At the age of 50, Carrie Alvarez was diagnosed with uterine cancer. She had a hysterectomy and ovaries removed.   At the age of 72, Carrie Alvarez had breast cancer that was treated with lumpectomy and chemotherapy.  At the age of 89, Carrie Alvarez was diagnosed with colon cancer. This was treated with colectomy and chemotherapy. Tumor testing revealed loss of MLH1 and PMS2, BRAF negative and MLH1 hypermethylation absent.   CANCER HISTORY:  Oncology History  Colon cancer ([place])  01/10/2019 Initial Diagnosis   Colon cancer ([place])   01/19/2019 Cancer Staging   Staging form: Colon and Rectum, AJCC 8th Edition - Pathologic stage from 01/19/2019: Stage IIIA (pT1, pN1a, cM0) - Signed by [name], MD on 01/19/2019      RISK FACTORS:  Menarche was at age 27  First live birth at age 36  Ovaries intact: no Hysterectomy: yes in 1994 Menopausal status: postmenopausal HRT use: 0 years. Mammogram within the last year: Yes Number of breast biopsies: 1 Any excessive radiation exposure in the past: no  Past Medical History:  Diagnosis Date  . Allergy   .  Anemia   . Back pain   . Breast cancer ([place]) 2015   LT LUMPECTOMY 12-2014 FOLLOWING CHEMO  . Carpal tunnel syndrome    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  . Cataract   . Colon cancer ([place]) 2020  . Depression   . Family history of breast cancer   . Family history of colon cancer   . GERD (gastroesophageal reflux disease)    problem with reflux during chemo  . History of methicillin resistant staphylococcus aureus (MRSA)   . Hypercholesteremia   . Hypertension   . Lumbosacral pain    sees pain management in [place]  . Obesity   . Personal history of chemotherapy 2016  . Pinched nerve    left side, lumbar area  . Uterine cancer ([place]) 1994    Past Surgical History:  Procedure Laterality Date  . ABDOMINAL HYSTERECTOMY  1994   UTERINE CA  . AXILLARY LYMPH NODE BIOPSY Left 12/18/2014   Procedure: AXILLARY LYMPH NODE BIOPSY/;  Surgeon: Robert Bellow, MD;  Location: [facility];  Service: General;  Laterality: Left;  . AXILLARY LYMPH NODE DISSECTION Left 12/18/2014   Procedure: AXILLARY LYMPH NODE DISSECTION;  Surgeon: Robert Bellow, MD;  Location: [facility];  Service: General;  Laterality: Left;  . BREAST BIOPSY Left 05/2014   CORE BX OF LN, METASTATIC ADENOCARCINOMA  . BREAST LUMPECTOMY Left 12/2014   CHEMO FIRST THEN SURGERY OF LN  . BREAST SURGERY  lymph node removal  . CHOLECYSTECTOMY N/A 04/19/2016   Procedure: LAPAROSCOPIC CHOLECYSTECTOMY WITH INTRAOPERATIVE CHOLANGIOGRAM;  Surgeon: Hubbard Robinson, MD;  Location: [facility];  Service: General;  Laterality: N/A;  . COLON RESECTION Right 01/10/2019   Procedure: HAND ASSISTED LAPAROSCOPIC RIGHT COLON RESECTION;  Surgeon: Jules Husbands, MD;  Location: [facility];  Service: General;  Laterality: Right;  . COLONOSCOPY WITH PROPOFOL N/A 12/30/2018   Procedure: COLONOSCOPY WITH PROPOFOL;  Surgeon: Lucilla Lame, MD;  Location: [facility];  Service: Endoscopy;  Laterality: N/A;  . medial branch block  11/06/2015    lumbar facet Dr. Primus Bravo  . POLYPECTOMY N/A 12/30/2018   Procedure: POLYPECTOMY;  Surgeon: Lucilla Lame, MD;  Location: [facility];  Service: Endoscopy;  Laterality: N/A;  Clips placed at Hepatic Flexure Polyp (2) and Transverse Colon Polyp (4) removal sites  . PORTACATH PLACEMENT Right 02/02/2019   Procedure: INSERTION PORT-A-CATH;  Surgeon: Jules Husbands, MD;  Location: [facility];  Service: General;  Laterality: Right;  . SENTINEL NODE BIOPSY Left 12/18/2014   Procedure: SENTINEL NODE BIOPSY;  Surgeon: Robert Bellow, MD;  Location: [facility];  Service: General;  Laterality: Left;    Social History   Socioeconomic History  . Marital status: Married    Spouse name: Vicente Serene  . Number of children: 3  . Years of education: Not on file  . Highest education level: Some college, no degree  Occupational History  . Occupation: retired    Comment: was on disability  Social Needs  . Financial resource strain: Somewhat hard  . Food insecurity    Worry: Often true    Inability: Often true  . Transportation needs    Medical: No    Non-medical: No  Tobacco Use  . Smoking status: Former Smoker    Packs/day: 0.50    Types: Cigarettes    Quit date: 07/18/2014    Years since quitting: 4.8  . Smokeless tobacco: Never Used  Substance and Sexual Activity  . Alcohol use: No    Alcohol/week: 0.0 standard drinks  . Drug use: No  . Sexual activity: Not on file  Lifestyle  . Physical activity    Days per week: 0 days    Minutes per session: 0 min  . Stress: To some extent  Relationships  . Social Herbalist on phone: Patient refused    Gets together: Patient refused    Attends religious service: Patient refused    Active member of club or organization: Patient refused    Attends meetings of clubs or organizations: Patient refused    Relationship status: Patient refused  Other Topics Concern  . Not on file  Social History Narrative   Husband and patient have not lived  together for 10 years.  He is still helpful with patient.     FAMILY HISTORY:  We obtained a detailed, 4-generation family history.  Significant diagnoses are listed below: Family History  Problem Relation Age of Onset  . Breast cancer Sister 53  . Colon cancer Sister   . Colon cancer Mother        dx 35s  . Hypertension Mother   . Asthma Mother   . Cancer Father        voice box removed  . Cancer Maternal Grandmother        unsure type  . Colon cancer Cousin   . Cancer Cousin        unsure type    Ms.  Alvarez has 2 sons, ages 63 and 30, and 1 daughter, age 53. She reports her son goes to the [employer] in [place] and recently had a colectomy, but she is unsure if he had colon cancer or polyps, or if he has had genetic testing. Carrie Alvarez has 3 sisters and 1 brother. One of her sisters had breast cancer at 42 and colon cancer at some point after the breast, and is living at 48. This sister reportedly tested positive for Lynch syndrome.   Carrie Alvarez mother was diagnosed with colon cancer in her 6s and died 2 years ago at age 89. Patient had 3 maternal uncles and 1 maternal aunt, no known cancers. Two daughters of her aunt (patient's cousins) had cancer. One of them had colon cancer and the other she is unsure of the cancer type. Patient's maternal grandmother also had cancer but she is unsure the type. Maternal grandfather passed at 54.  Carrie Alvarez has limited information about her father's side. She believes her father had cancer, he had his voice box removed. Patient had 3 paternal uncles, 2 paternal aunts, no cancers she is aware of. No known cancers in her paternal cousins. She does not have information about paternal grandparents.   Carrie Alvarez is aware of previous family history of genetic testing for hereditary cancer risks.  There is no reported Ashkenazi Jewish ancestry. There is no known consanguinity.  GENETIC COUNSELING ASSESSMENT: Carrie Alvarez is a 68 y.o. female  with a reported family history of Lynch syndrome and personal history of Lynch syndrome cancers. We, therefore, discussed and recommended the following at today's visit.   DISCUSSION: We discussed that 5-10% of cancer in general is hereditary. We specifically discussed Lynch syndrome and the cancers and management associated, and noting the colon cancers in her family and her own uterine cancer. We also discussed that her colon tumor was tested for signs of Lynch syndrome and did reveal loss of MLH1 and PMS2. There are other genes that can be associated with hereditary cancer syndromes as well. We discussed that testing is beneficial for several reasons including knowing how to follow individuals after completing their treatment, and understand if other family members could be at risk for cancer and allow them to undergo genetic testing.   We reviewed the characteristics, features and inheritance patterns of hereditary cancer syndromes. We also discussed genetic testing, including the appropriate family members to test, the process of testing, insurance coverage and turn-around-time for results. We discussed the implications of a negative, positive and/or variant of uncertain significant result. We recommended Carrie Alvarez pursue genetic testing for the [facility] Common Hereditary Cancers gene panel.   The Common Hereditary Cancers Panel offered by [facility] includes sequencing and/or deletion duplication testing of the following 48 genes: APC, ATM, AXIN2, BARD1, BMPR1A, BRCA1, BRCA2, BRIP1, CDH1, CDKN2A (p14ARF), CDKN2A (p16INK4a), CKD4, CHEK2, CTNNA1, DICER1, EPCAM (Deletion/duplication testing only), GREM1 (promoter region deletion/duplication testing only), KIT, MEN1, MLH1, MSH2, MSH3, MSH6, MUTYH, NBN, NF1, NHTL1, PALB2, PDGFRA, PMS2, POLD1, POLE, PTEN, RAD50, RAD51C, RAD51D, RNF43, SDHB, SDHC, SDHD, SMAD4, SMARCA4. STK11, TP53, TSC1, TSC2, and VHL.  The following genes were evaluated for sequence changes  only: SDHA and HOXB13 c.251G>A variant only.  Based on Carrie Alvarez's personal and family history of cancer, she meets medical criteria for genetic testing. Despite that she meets criteria, she may still have an out of pocket cost.   PLAN: After considering the risks, benefits, and limitations, Carrie Alvarez provided informed consent to pursue genetic  testing and the blood sample was sent to [facility] for analysis of the Common Hereditary Cancers Panel. Results should be available within approximately 2-3 weeks' time, at which point they will be disclosed by telephone to Carrie Alvarez, as will any additional recommendations warranted by these results. Carrie Alvarez will receive a summary of her genetic counseling visit and a copy of her results once available. This information will also be available in Epic.   Carrie Alvarez questions were answered to her satisfaction today. Our contact information was provided should additional questions or concerns arise. Thank you for the referral and allowing [place] to share in the care of your patient.   Faith Rogue, MS, [facility] Genetic Counselor [email or web address] Phone: [phone]  The patient was seen for a total of 30 minutes in face-to-face genetic counseling.  Dr. Grayland Ormond was  available for discussion regarding this case.   _______________________________________________________________________ For Office Staff:  Number of people involved in session: 1 Was an Intern/ student involved with case: no

## 2019-05-12 NOTE — Progress Notes (Signed)
Patient is coming in Monday for follow up she is doing well no complaints or questions.

## 2019-05-14 NOTE — Progress Notes (Signed)
[place]  [phone] [phone]) [phone]  ID: Norm Salt OB: 1951/02/21  MR#: [id number]  [facility]#:[id number]  Patient Care Team: Paulene Floor as PCP - General (Physician Assistant) [name], MD as Consulting Physician (Oncology) [name], MD as Attending Physician (Pain Medicine) [name]., MD as Consulting Physician (Ophthalmology) [name], RN as Oncology Nurse Navigator  CHIEF COMPLAINT: Stage IIa ER+ left breast cancer with no breast lesion and axillary lymph node metastasis.  Now with stage IIIa colon cancer.  INTERVAL HISTORY: Patient returns to clinic today for further evaluation and consideration of cycle 7 of FOLFOX plus Tecentriq on clinical trial.  She continues to have mild diarrhea, but this is better controlled with Imodium.  Her peripheral neuropathy is unchanged.  She continues to have chronic weakness and fatigue.  She has no other neurologic complaints.  She has a good appetite and denies weight loss.  She denies any recent fevers or illnesses.  She has no chest pain, shortness of breath, cough, or hemoptysis.  She denies any nausea, vomiting, or constipation.  She has no melena or hematochezia.  She has no urinary complaints.  Patient offers no further specific complaints today.  REVIEW OF SYSTEMS:   Review of Systems  Constitutional: Positive for malaise/fatigue. Negative for fever and weight loss.  Respiratory: Negative.  Negative for cough and shortness of breath.   Cardiovascular: Negative.  Negative for chest pain and leg swelling.  Gastrointestinal: Positive for diarrhea. Negative for abdominal pain, blood in stool, constipation, melena, nausea and vomiting.  Genitourinary: Negative.  Negative for dysuria.  Musculoskeletal: Negative.  Negative for back pain.  Skin: Negative.  Negative for rash.  Neurological: Positive for sensory change and weakness. Negative for dizziness, focal  weakness and headaches.  Psychiatric/Behavioral: Negative.  Negative for depression. The patient is not nervous/anxious.     As per HPI. Otherwise, a complete review of systems is negative.  PAST MEDICAL HISTORY: Past Medical History:  Diagnosis Date  . Allergy   . Anemia   . Back pain   . Breast cancer ([place]) 2015   LT LUMPECTOMY 12-2014 FOLLOWING CHEMO  . Carpal tunnel syndrome    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  . Cataract   . Colon cancer ([place]) 2020  . Depression   . Family history of breast cancer   . Family history of colon cancer   . GERD (gastroesophageal reflux disease)    problem with reflux during chemo  . History of methicillin resistant staphylococcus aureus (MRSA)   . Hypercholesteremia   . Hypertension   . Lumbosacral pain    sees pain management in [place]  . Obesity   . Personal history of chemotherapy 2016  . Pinched nerve    left side, lumbar area  . Uterine cancer ([place]) 1994    PAST SURGICAL HISTORY: Past Surgical History:  Procedure Laterality Date  . ABDOMINAL HYSTERECTOMY  1994   UTERINE CA  . AXILLARY LYMPH NODE BIOPSY Left 12/18/2014   Procedure: AXILLARY LYMPH NODE BIOPSY/;  Surgeon: Robert Bellow, MD;  Location: [facility];  Service: General;  Laterality: Left;  . AXILLARY LYMPH NODE DISSECTION Left 12/18/2014   Procedure: AXILLARY LYMPH NODE DISSECTION;  Surgeon: Robert Bellow, MD;  Location: [facility];  Service: General;  Laterality: Left;  . BREAST BIOPSY Left 05/2014   CORE BX OF LN, METASTATIC ADENOCARCINOMA  . BREAST LUMPECTOMY Left 12/2014   CHEMO FIRST THEN SURGERY OF  LN  . BREAST SURGERY     lymph node removal  . CHOLECYSTECTOMY N/A 04/19/2016   Procedure: LAPAROSCOPIC CHOLECYSTECTOMY WITH INTRAOPERATIVE CHOLANGIOGRAM;  Surgeon: Hubbard Robinson, MD;  Location: [facility];  Service: General;  Laterality: N/A;  . COLON RESECTION Right 01/10/2019   Procedure: HAND ASSISTED LAPAROSCOPIC RIGHT COLON RESECTION;  Surgeon: Jules Husbands, MD;  Location: [facility];  Service: General;  Laterality: Right;  . COLONOSCOPY WITH PROPOFOL N/A 12/30/2018   Procedure: COLONOSCOPY WITH PROPOFOL;  Surgeon: Lucilla Lame, MD;  Location: [place];  Service: Endoscopy;  Laterality: N/A;  . medial branch block  11/06/2015   lumbar facet Dr. Primus Bravo  . POLYPECTOMY N/A 12/30/2018   Procedure: POLYPECTOMY;  Surgeon: Lucilla Lame, MD;  Location: [place];  Service: Endoscopy;  Laterality: N/A;  Clips placed at Hepatic Flexure Polyp (2) and Transverse Colon Polyp (4) removal sites  . PORTACATH PLACEMENT Right 02/02/2019   Procedure: INSERTION PORT-A-CATH;  Surgeon: Jules Husbands, MD;  Location: [facility];  Service: General;  Laterality: Right;  . SENTINEL NODE BIOPSY Left 12/18/2014   Procedure: SENTINEL NODE BIOPSY;  Surgeon: Robert Bellow, MD;  Location: [facility];  Service: General;  Laterality: Left;    FAMILY HISTORY Family History  Problem Relation Age of Onset  . Breast cancer Sister 51  . Colon cancer Sister   . Colon cancer Mother        dx 51s  . Hypertension Mother   . Asthma Mother   . Cancer Father        voice box removed  . Cancer Maternal Grandmother        unsure type  . Colon cancer Cousin   . Cancer Cousin        unsure type       ADVANCED DIRECTIVES:    HEALTH MAINTENANCE: Social History   Tobacco Use  . Smoking status: Former Smoker    Packs/day: 0.50    Types: Cigarettes    Quit date: 07/18/2014    Years since quitting: 4.8  . Smokeless tobacco: Never Used  Substance Use Topics  . Alcohol use: No    Alcohol/week: 0.0 standard drinks  . Drug use: No    No Known Allergies  Current Outpatient Medications  Medication Sig Dispense Refill  . acetaminophen (TYLENOL) 500 MG tablet Take 500 mg by mouth every 6 (six) hours as needed for mild pain or moderate pain.     Marland Kitchen albuterol (PROVENTIL HFA;VENTOLIN HFA) 108 (90 Base) MCG/ACT inhaler Inhale 2 puffs into the lungs every 6 (six)  hours as needed for wheezing or shortness of breath. 1 Inhaler 2  . amLODipine (NORVASC) 10 MG tablet Take 1 tablet (10 mg total) by mouth daily. (Patient taking differently: Take 10 mg by mouth every morning. ) 90 tablet 0  . baclofen (LIORESAL) 10 MG tablet Take 1 tablet (10 mg total) by mouth 2 (two) times daily. (Patient taking differently: Take 10 mg by mouth 3 (three) times daily as needed for muscle spasms. ) 30 each 0  . cholecalciferol (VITAMIN D) 1000 units tablet Take 1,000 Units by mouth daily.    . diphenoxylate-atropine (LOMOTIL) 2.5-0.025 MG tablet Take 1 tablet by mouth 4 (four) times daily as needed for diarrhea or loose stools. 30 tablet 0  . fluticasone (FLONASE) 50 MCG/ACT nasal spray Place 2 sprays into both nostrils daily. 16 g 0  . gabapentin (NEURONTIN) 300 MG capsule Take 300-600 mg by mouth 4 (  four) times daily as needed (neuropathic pain).     Marland Kitchen lidocaine-prilocaine (EMLA) cream Apply to affected area once 30 g 3  . Oxycodone HCl 10 MG TABS LIMIT ONE HALF TO ONE TABLET BY MOUTH 3 TO 5 TIMES DAILY IF TOLERATED    . predniSONE (DELTASONE) 50 MG tablet Take 1 tab daily for a total of 7 days. 7 tablet 0  . pyridoxine (B-6) 100 MG tablet Take 100 mg by mouth daily.    . sertraline (ZOLOFT) 100 MG tablet Take 2 tablets by mouth once daily 180 tablet 0  . vitamin B-12 (CYANOCOBALAMIN) 500 MCG tablet Take 500 mcg by mouth daily.    Marland Kitchen atorvastatin (LIPITOR) 10 MG tablet Take 1 tablet (10 mg total) by mouth every morning. 90 tablet 0  . ondansetron (ZOFRAN ODT) 4 MG disintegrating tablet Take 1 tablet (4 mg total) by mouth every 8 (eight) hours as needed for nausea or vomiting. (Patient not taking: Reported on 05/12/2019) 20 tablet 0  . potassium chloride SA (K-DUR) 20 MEQ tablet Take 1 tablet (20 mEq total) by mouth 2 (two) times daily. (Patient not taking: Reported on 05/12/2019) 20 tablet 0   No current facility-administered medications for this visit.    Facility-Administered  Medications Ordered in Other Visits  Medication Dose Route Frequency Provider Last Rate Last Dose  . fluorouracil ALLIANCE [id number] (ADRUCIL) 4,700 mg in sodium chloride 0.9 % 56 mL chemo infusion  2,400 mg/m2 (Treatment Plan Recorded) Intravenous 1 day or 1 dose [name], MD   4,700 mg at 02/27/19 1448  . heparin lock flush 100 unit/mL  500 Units Intravenous Once [name], MD      . heparin lock flush 100 unit/mL  500 Units Intravenous Once [name], MD      . sodium chloride flush (NS) 0.9 % injection 10 mL  10 mL Intravenous PRN [name], MD   10 mL at 02/27/19 0925    OBJECTIVE: Vitals:   05/15/19 0851  BP: 108/84  Pulse: 97  Resp: 18  Temp: 97.9 F (36.6 C)  SpO2: 100%     Body mass index is 34.28 kg/m.    ECOG FS:1 - Symptomatic but completely ambulatory  General: Well-developed, well-nourished, no acute distress.  Sitting in a wheelchair. Eyes: Pink conjunctiva, anicteric sclera. HEENT: Normocephalic, moist mucous membranes. Lungs: Clear to auscultation bilaterally. Heart: Regular rate and rhythm. No rubs, murmurs, or gallops. Abdomen: Soft, nontender, nondistended. No organomegaly noted, normoactive bowel sounds. Musculoskeletal: No edema, cyanosis, or clubbing. Neuro: Alert, answering all questions appropriately. Cranial nerves grossly intact. Skin: No rashes or petechiae noted. Psych: Normal affect.  LAB RESULTS:  Lab Results  Component Value Date   NA 143 05/15/2019   K 3.3 (L) 05/15/2019   CL 110 05/15/2019   CO2 22 05/15/2019   GLUCOSE 114 (H) 05/15/2019   BUN 10 05/15/2019   CREATININE 1.24 (H) 05/15/2019   CALCIUM 9.4 05/15/2019   PROT 7.1 05/15/2019   ALBUMIN 3.8 05/15/2019   AST 23 05/15/2019   ALT 14 05/15/2019   ALKPHOS 79 05/15/2019   BILITOT 0.4 05/15/2019   GFRNONAA 45 (L) 05/15/2019   GFRAA 52 (L) 05/15/2019    Lab Results  Component Value Date   WBC 4.7 05/15/2019   NEUTROABS 2.7 05/15/2019    HGB 10.5 (L) 05/15/2019   HCT 32.0 (L) 05/15/2019   MCV 85.6 05/15/2019   PLT 149 (L) 05/15/2019     STUDIES: No  results found.  ASSESSMENT: Stage IIa ER+ left breast cancer with no breast lesion and axillary lymph node metastasis. (TxN1M0)  HER-2 negative.  Now with stage IIIa colon cancer.  PLAN:   1.  Stage IIIa colon cancer: Pathology report reviewed independently.  CT scan of her abdomen on January 02, 2019 that did not reveal metastatic disease.  CT scan of the chest on February 07, 2019 also was negative for metastatic disease.  Patient agreed to enroll in clinical trial.  Proceed with cycle 7 of 12 of FOLFOX plus Tecentriq today.  Return to clinic in 2 days for pump removal and then in 2 weeks for further evaluation and consideration of cycle 8.    2. Stage IIa ER+ left breast cancer with no breast lesion and axillary lymph node metastasis: Despite no obvious breast lesion on mammogram or breast MRI, pathology and pattern of spread was consistent with primary breast cancer. CT and bone scan at time of diagnosis revealed no other evidence of malignancy.  She only received 3 cycles of Adriamycin and Cytoxan. Taxol was discontinued early as well secondary to persistent peripheral neuropathy. Patient completed her chemotherapy on Nov 19, 2014.  She elected not to pursue axillary node dissection given the potential morbidity of this procedure. It was also elected not to pursue adjuvant XRT given there was no primary breast lesion.  Patient completed approximately 4 years of treatment with letrozole, but this has been discontinued secondary to enrolling in clinical trial for her newly diagnosed colon cancer.  Her most recent mammogram on June 29, 2018 was reported as BI-RADS 2.  Repeat in December 2020.  Continue to monitor throughout treatment for colon cancer. 3.  Bone health: Patient's most recent bone mineral density on February 03, 2018 reported T score of -0.7 which is unchanged from 3 years prior.   Consider repeat bone mineral density in July 2021.  4.  Family history: Patient's sister reports she has positive for Lynch syndrome, but these results were never confirmed.  Proceed with genetic testing. 5. Pain/Peripheral neuropathy: Chronic and unchanged.  Monitor. 6. Hot flashes: Patient does not complain of this today.  Letrozole has been discontinued. 7. Anemia: Hemoglobin is decreased, but stable at 10.5.  Monitor. 8.  Thrombocytopenia: Mildly improved.  Monitor. 9.  Renal insufficiency: Creatinine has trended up slightly to 1.24.  Encouraged fluid intake. 10.  Hypokalemia: Mild.  Patient was given dietary recommendations.  She also states she has some potassium supplementation at home. 11.  Leukopenia: Resolved. 12.  Diarrhea: Improved.  Continue Imodium as needed.  Patient expressed understanding and was in agreement with this plan. She also understands that She can call clinic at any time with any questions, concerns, or complaints.   Breast cancer metastasized to axillary lymph node   Staging form: Breast, AJCC 7th Edition     Clinical stage from 10/22/2014: Stage Unknown (TX, N1, M0) - Signed by [name], MD on 10/22/2014   [name], MD   05/15/2019 9:28 AM

## 2019-05-15 ENCOUNTER — Inpatient Hospital Stay: Payer: Medicare Other

## 2019-05-15 ENCOUNTER — Inpatient Hospital Stay: Payer: Medicare Other | Attending: Oncology

## 2019-05-15 ENCOUNTER — Encounter: Payer: Self-pay | Admitting: *Deleted

## 2019-05-15 ENCOUNTER — Inpatient Hospital Stay (HOSPITAL_BASED_OUTPATIENT_CLINIC_OR_DEPARTMENT_OTHER): Payer: Medicare Other | Admitting: Oncology

## 2019-05-15 ENCOUNTER — Other Ambulatory Visit: Payer: Self-pay

## 2019-05-15 VITALS — BP 108/84 | HR 97 | Temp 97.9°F | Resp 18 | Wt 196.6 lb

## 2019-05-15 VITALS — BP 143/89 | HR 72 | Temp 97.1°F | Resp 18

## 2019-05-15 DIAGNOSIS — C184 Malignant neoplasm of transverse colon: Secondary | ICD-10-CM

## 2019-05-15 DIAGNOSIS — Z90710 Acquired absence of both cervix and uterus: Secondary | ICD-10-CM | POA: Diagnosis not present

## 2019-05-15 DIAGNOSIS — Z79899 Other long term (current) drug therapy: Secondary | ICD-10-CM | POA: Insufficient documentation

## 2019-05-15 DIAGNOSIS — Z006 Encounter for examination for normal comparison and control in clinical research program: Secondary | ICD-10-CM | POA: Diagnosis present

## 2019-05-15 DIAGNOSIS — Z1509 Genetic susceptibility to other malignant neoplasm: Secondary | ICD-10-CM | POA: Insufficient documentation

## 2019-05-15 DIAGNOSIS — C773 Secondary and unspecified malignant neoplasm of axilla and upper limb lymph nodes: Secondary | ICD-10-CM | POA: Insufficient documentation

## 2019-05-15 DIAGNOSIS — C189 Malignant neoplasm of colon, unspecified: Secondary | ICD-10-CM | POA: Insufficient documentation

## 2019-05-15 DIAGNOSIS — G629 Polyneuropathy, unspecified: Secondary | ICD-10-CM | POA: Diagnosis not present

## 2019-05-15 DIAGNOSIS — C50912 Malignant neoplasm of unspecified site of left female breast: Secondary | ICD-10-CM | POA: Diagnosis not present

## 2019-05-15 DIAGNOSIS — N289 Disorder of kidney and ureter, unspecified: Secondary | ICD-10-CM | POA: Insufficient documentation

## 2019-05-15 DIAGNOSIS — D649 Anemia, unspecified: Secondary | ICD-10-CM | POA: Insufficient documentation

## 2019-05-15 DIAGNOSIS — D696 Thrombocytopenia, unspecified: Secondary | ICD-10-CM | POA: Diagnosis not present

## 2019-05-15 DIAGNOSIS — Z8542 Personal history of malignant neoplasm of other parts of uterus: Secondary | ICD-10-CM | POA: Diagnosis not present

## 2019-05-15 DIAGNOSIS — E876 Hypokalemia: Secondary | ICD-10-CM | POA: Diagnosis not present

## 2019-05-15 DIAGNOSIS — R197 Diarrhea, unspecified: Secondary | ICD-10-CM | POA: Diagnosis not present

## 2019-05-15 DIAGNOSIS — Z5111 Encounter for antineoplastic chemotherapy: Secondary | ICD-10-CM | POA: Insufficient documentation

## 2019-05-15 LAB — COMPREHENSIVE METABOLIC PANEL
ALT: 14 U/L (ref 0–44)
AST: 23 U/L (ref 15–41)
Albumin: 3.8 g/dL (ref 3.5–5.0)
Alkaline Phosphatase: 79 U/L (ref 38–126)
Anion gap: 11 (ref 5–15)
BUN: 10 mg/dL (ref 8–23)
CO2: 22 mmol/L (ref 22–32)
Calcium: 9.4 mg/dL (ref 8.9–10.3)
Chloride: 110 mmol/L (ref 98–111)
Creatinine, Ser: 1.24 mg/dL — ABNORMAL HIGH (ref 0.44–1.00)
GFR calc Af Amer: 52 mL/min — ABNORMAL LOW (ref >60)
GFR calc non Af Amer: 45 mL/min — ABNORMAL LOW (ref >60)
Glucose, Bld: 114 mg/dL — ABNORMAL HIGH (ref 70–99)
Potassium: 3.3 mmol/L — ABNORMAL LOW (ref 3.5–5.1)
Sodium: 143 mmol/L (ref 135–145)
Total Bilirubin: 0.4 mg/dL (ref 0.3–1.2)
Total Protein: 7.1 g/dL (ref 6.5–8.1)

## 2019-05-15 LAB — CBC WITH DIFFERENTIAL/PLATELET
Abs Immature Granulocytes: 0.02 10*3/uL (ref 0.00–0.07)
Basophils Absolute: 0 10*3/uL (ref 0.0–0.1)
Basophils Relative: 0 %
Eosinophils Absolute: 0 10*3/uL (ref 0.0–0.5)
Eosinophils Relative: 1 %
HCT: 32 % — ABNORMAL LOW (ref 36.0–46.0)
Hemoglobin: 10.5 g/dL — ABNORMAL LOW (ref 12.0–15.0)
Immature Granulocytes: 0 %
Lymphocytes Relative: 30 %
Lymphs Abs: 1.4 10*3/uL (ref 0.7–4.0)
MCH: 28.1 pg (ref 26.0–34.0)
MCHC: 32.8 g/dL (ref 30.0–36.0)
MCV: 85.6 fL (ref 80.0–100.0)
Monocytes Absolute: 0.5 10*3/uL (ref 0.1–1.0)
Monocytes Relative: 10 %
Neutro Abs: 2.7 10*3/uL (ref 1.7–7.7)
Neutrophils Relative %: 59 %
Platelets: 149 10*3/uL — ABNORMAL LOW (ref 150–400)
RBC: 3.74 MIL/uL — ABNORMAL LOW (ref 3.87–5.11)
RDW: 19.3 % — ABNORMAL HIGH (ref 11.5–15.5)
WBC: 4.7 10*3/uL (ref 4.0–10.5)
nRBC: 0 % (ref 0.0–0.2)

## 2019-05-15 LAB — URINALYSIS, COMPLETE (UACMP) WITH MICROSCOPIC
Bacteria, UA: NONE SEEN
Bilirubin Urine: NEGATIVE
Glucose, UA: NEGATIVE mg/dL
Hgb urine dipstick: NEGATIVE
Ketones, ur: NEGATIVE mg/dL
Leukocytes,Ua: NEGATIVE
Nitrite: NEGATIVE
Protein, ur: 30 mg/dL — AB
Specific Gravity, Urine: 1.027 (ref 1.005–1.030)
WBC, UA: NONE SEEN WBC/hpf (ref 0–5)
pH: 5 (ref 5.0–8.0)

## 2019-05-15 MED ORDER — PALONOSETRON HCL INJECTION 0.25 MG/5ML
0.2500 mg | Freq: Once | INTRAVENOUS | Status: AC
  Administered 2019-05-15: 0.25 mg via INTRAVENOUS
  Filled 2019-05-15: qty 5

## 2019-05-15 MED ORDER — DEXTROSE 5 % IV SOLN
Freq: Once | INTRAVENOUS | Status: AC
  Administered 2019-05-15: 12:00:00 via INTRAVENOUS
  Filled 2019-05-15: qty 250

## 2019-05-15 MED ORDER — LEUCOVORIN 350 MG INJECTION FOR ALLIANCE A021502
800.0000 mg | Freq: Once | INTRAVENOUS | Status: AC
  Administered 2019-05-15: 800 mg via INTRAVENOUS
  Filled 2019-05-15: qty 17.5

## 2019-05-15 MED ORDER — SODIUM CHLORIDE 0.9 % IV SOLN
Freq: Once | INTRAVENOUS | Status: AC
  Administered 2019-05-15: 12:00:00 via INTRAVENOUS
  Filled 2019-05-15: qty 5

## 2019-05-15 MED ORDER — SODIUM CHLORIDE 0.9 % IV SOLN
2400.0000 mg/m2 | INTRAVENOUS | Status: DC
  Administered 2019-05-15: 4700 mg via INTRAVENOUS
  Filled 2019-05-15: qty 94

## 2019-05-15 MED ORDER — FLUOROURACIL CHEMO INJECTION 2.5 GM/50ML ALLIANCE A021502
400.0000 mg/m2 | Freq: Once | INTRAVENOUS | Status: AC
  Administered 2019-05-15: 800 mg via INTRAVENOUS
  Filled 2019-05-15: qty 16

## 2019-05-15 MED ORDER — OXALIPLATIN CHEMO INJECTION 100 MG/20ML FOR ALLIANCE A021502
130.0000 mg | Freq: Once | INTRAVENOUS | Status: AC
  Administered 2019-05-15: 130 mg via INTRAVENOUS
  Filled 2019-05-15: qty 20

## 2019-05-15 MED ORDER — SODIUM CHLORIDE 0.9 % IV SOLN
840.0000 mg | Freq: Once | INTRAVENOUS | Status: AC
  Administered 2019-05-15: 840 mg via INTRAVENOUS
  Filled 2019-05-15: qty 14

## 2019-05-15 MED ORDER — SODIUM CHLORIDE 0.9 % IV SOLN
Freq: Once | INTRAVENOUS | Status: AC
  Administered 2019-05-15: 11:00:00 via INTRAVENOUS
  Filled 2019-05-15: qty 250

## 2019-05-15 NOTE — Research (Addendum)
Patient Carrie Alvarez returns to clinic again this morning for her C7D1 treatment on the [id number] [name] study. Local labs were collected via port-a-cath per protocol and CBC and chemistry values were reviewed by Dr. Grayland Ormond and remain within acceptable range for patient to receive her treatment this morning. Patient has been unable to provide a urine specimen, but will collect this as soon as she is able. VS stable and SpO2 was 100% on room air this morning with patient wearing a mask. Patient reports previous abdominal pain has completely resolved and states she has remained pain free since last Wednesday - 04/12/2019. She also reports her diarrhea has improved some and she is now only experiencing 2-3 solid/loose stools per day - usually after she eats something. Solicited adverse events were assessed by this RN again today. Patient completed the C7 PRO-CTCAE booklet while in clinic. Patient remains in a wheelchair only while in clinic due to her ongoing issues with fatigue, but states she is completely ambulatory and uses a cane while at home. Reports her neuropathy in fingertips and toes is only mild and consists of some tingling and numbness, but she does still report cold sensitivity for about 10 days following the Oxaliplatin infusion. Patient remains on level-1 dose reduced Oxaliplatin. Tenderness in the palms of her hands and the bottoms of her feet continues, and her skin has turned dark in these areas, but this is also improved since first reported. Patient's weight remains consistent with her baseline weight. Serum creatinine slightly more elevated today at at 1.24mg /dL, and CrCl is 49mL/min.. Platelet count has improved to 149,000 and Serum potassium is a little low at 3.3mg /dL. Patient's anemia remains at grade I with Hgb at 10.5g/dL following her chemotherapy 2 weeks ago. H&P completed by Dr. Grayland Ormond and patient will proceed to Cycle 7 chemotherapy + atezolizumab. Solicited and other Adverse  events with grade and attribution as noted below.   Adverse Event Log  Study/Protocol: [id number] [name] Cycle: Cycle 6 Event Grade Onset Date Resolved Date Drug Name Attribution Treatment Comments  Diarrhea 1 01/10/2019 05/02/2019 n/a surgery Imodium Lomotil Still improved with only 2-3 stools/day now  Abdominal pain 2 04/08/2019 01/10/2019 04/12/2019 04/08/2019 n/a surgery Oxycodone Reports pain resolved  Constipation 0      Denies  Nausea 0      None in 2-3 weeks  Fatigue 2 06/12/2018 approx   ?  Reports no energy in past 6-9 months  Anorexia 1 07/13/2008   ?  States this is her norm for several years  Cough 0        Dyspnea 0        Fever 0        UTI 0        Palmer-plantar erythrodesia 1 03/13/2019    otc cream Bilateral palms and anterior fingers discolored and tender  Total biliruben 0        Neutrophils 0        TSH 0      No history of hypothyroidism  Sensory Peripheral Neuropathy 2 11/19/2014   Paclitaxel gabapentin Tingling in fingertips and feet bil. since taking Taxol  Hypokalemia 1 03/27/2019 02/27/2019 04/17/2019 03/13/2019  probable none K+ up to 3.5mg /dL today  Anemia 1 05/01/2019     Hgb 10.2  Anemia 2 02/27/2019 04/17/2019  probable none Hgb 12.4  Elevated creatinine 1 02/07/2019   unrelated  Elevated at baseline CrCl: 49mL/min  Platelet count decreased 1 03/27/2019 04/17/2019  definitely none Plts 261,000 today -  Possible Colitis 2 04/08/2019 04/12/2019  probable Steroids Resolved  Weight Loss 1 04/10/2019 04/17/2019  probable none Weight back to normal    Proteinuria 1 05/15/2019    none Urine dipstick only  [name], BSN, [place], OCN 05/15/2019 10:53 AM  Urine specimen for U/A collected prior to initiating Atezolizumab or chemotherapy this morning. Urine sample is concentrated even though patient reports drinking plenty of po fluids. [name], BSN, [facility], OCN 05/15/2019 11:25 AM

## 2019-05-17 ENCOUNTER — Inpatient Hospital Stay: Payer: Medicare Other

## 2019-05-17 ENCOUNTER — Other Ambulatory Visit: Payer: Self-pay

## 2019-05-17 VITALS — BP 100/74 | HR 84 | Temp 97.0°F | Resp 18

## 2019-05-17 DIAGNOSIS — C184 Malignant neoplasm of transverse colon: Secondary | ICD-10-CM

## 2019-05-17 DIAGNOSIS — Z5111 Encounter for antineoplastic chemotherapy: Secondary | ICD-10-CM | POA: Diagnosis not present

## 2019-05-17 MED ORDER — SODIUM CHLORIDE 0.9% FLUSH
10.0000 mL | INTRAVENOUS | Status: DC | PRN
  Administered 2019-05-17: 10 mL
  Filled 2019-05-17: qty 10

## 2019-05-17 MED ORDER — HEPARIN SOD (PORK) LOCK FLUSH 100 UNIT/ML IV SOLN
500.0000 [IU] | Freq: Once | INTRAVENOUS | Status: AC | PRN
  Administered 2019-05-17: 500 [IU]
  Filled 2019-05-17: qty 5

## 2019-05-19 ENCOUNTER — Telehealth: Payer: Self-pay | Admitting: Licensed Clinical Social Worker

## 2019-05-22 ENCOUNTER — Other Ambulatory Visit: Payer: Self-pay | Admitting: Oncology

## 2019-05-22 DIAGNOSIS — C189 Malignant neoplasm of colon, unspecified: Secondary | ICD-10-CM

## 2019-05-25 NOTE — Telephone Encounter (Signed)
Revealed positive genetic test result. MLH1 pathogenic variant identified, confirming Lynch syndrome. Carrie Alvarez will come see me at 9 am at the [facility] on 11/19 at 9 am to discuss these results further.

## 2019-05-26 NOTE — Progress Notes (Signed)
[place]  [phone] [phone]  ID: Carrie Alvarez OB: 1950/11/23  MR#: [id number]  AYO#:[id number]  Patient Care Team: Paulene Floor as PCP - General (Physician Assistant) [name], MD as Consulting Physician (Oncology) [name], MD as Attending Physician (Pain Medicine) [name]., MD as Consulting Physician (Ophthalmology) [name], RN as Oncology Nurse Navigator  CHIEF COMPLAINT: Stage IIa ER+ left breast cancer with no breast lesion and axillary lymph node metastasis.  Now with stage IIIa colon cancer.  INTERVAL HISTORY: Patient returns to clinic today for further evaluation and consideration of cycle 8 of FOLFOX plus Tecentriq on clinical trial.  Patient states over the past week she has felt "strange" and feels like she is "looking at her own body".  She continues to have chronic diarrhea which is controlled with Imodium.  Her peripheral neuropathy is unchanged.  She continues to have chronic weakness and fatigue.  She has no other neurologic complaints.  She has a good appetite and denies weight loss.  She denies any recent fevers or illnesses.  She has no chest pain, shortness of breath, cough, or hemoptysis.  She denies any nausea, vomiting, or constipation.  She has no melena or hematochezia.  She has no urinary complaints.  Patient offers no further specific complaints today.  REVIEW OF SYSTEMS:   Review of Systems  Constitutional: Positive for malaise/fatigue. Negative for fever and weight loss.  Respiratory: Negative.  Negative for cough and shortness of breath.   Cardiovascular: Negative.  Negative for chest pain and leg swelling.  Gastrointestinal: Positive for diarrhea. Negative for abdominal pain, blood in stool, constipation, melena, nausea and vomiting.  Genitourinary: Negative.  Negative for dysuria.  Musculoskeletal: Negative.  Negative for back pain.  Skin: Negative.  Negative  for rash.  Neurological: Positive for sensory change and weakness. Negative for dizziness, focal weakness and headaches.  Psychiatric/Behavioral: Negative.  Negative for depression. The patient is not nervous/anxious.     As per HPI. Otherwise, a complete review of systems is negative.  PAST MEDICAL HISTORY: Past Medical History:  Diagnosis Date  . Allergy   . Anemia   . Back pain   . Breast cancer ([place]) 2015   LT LUMPECTOMY 12-2014 FOLLOWING CHEMO  . Carpal tunnel syndrome    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  . Cataract   . Colon cancer ([place]) 2020  . Depression   . Family history of breast cancer   . Family history of colon cancer   . GERD (gastroesophageal reflux disease)    problem with reflux during chemo  . History of methicillin resistant staphylococcus aureus (MRSA)   . Hypercholesteremia   . Hypertension   . Lumbosacral pain    sees pain management in [place]  . Obesity   . Personal history of chemotherapy 2016  . Pinched nerve    left side, lumbar area  . Uterine cancer ([place]) 1994    PAST SURGICAL HISTORY: Past Surgical History:  Procedure Laterality Date  . ABDOMINAL HYSTERECTOMY  1994   UTERINE CA  . AXILLARY LYMPH NODE BIOPSY Left 12/18/2014   Procedure: AXILLARY LYMPH NODE BIOPSY/;  Surgeon: Robert Bellow, MD;  Location: [facility];  Service: General;  Laterality: Left;  . AXILLARY LYMPH NODE DISSECTION Left 12/18/2014   Procedure: AXILLARY LYMPH NODE DISSECTION;  Surgeon: Robert Bellow, MD;  Location: [facility];  Service: General;  Laterality: Left;  . BREAST BIOPSY Left 05/2014  CORE BX OF LN, METASTATIC ADENOCARCINOMA  . BREAST LUMPECTOMY Left 12/2014   CHEMO FIRST THEN SURGERY OF LN  . BREAST SURGERY     lymph node removal  . CHOLECYSTECTOMY N/A 04/19/2016   Procedure: LAPAROSCOPIC CHOLECYSTECTOMY WITH INTRAOPERATIVE CHOLANGIOGRAM;  Surgeon: Hubbard Robinson, MD;  Location: [facility];  Service: General;  Laterality: N/A;  . COLON  RESECTION Right 01/10/2019   Procedure: HAND ASSISTED LAPAROSCOPIC RIGHT COLON RESECTION;  Surgeon: Jules Husbands, MD;  Location: [facility];  Service: General;  Laterality: Right;  . COLONOSCOPY WITH PROPOFOL N/A 12/30/2018   Procedure: COLONOSCOPY WITH PROPOFOL;  Surgeon: Lucilla Lame, MD;  Location: [place];  Service: Endoscopy;  Laterality: N/A;  . medial branch block  11/06/2015   lumbar facet Dr. Primus Bravo  . POLYPECTOMY N/A 12/30/2018   Procedure: POLYPECTOMY;  Surgeon: Lucilla Lame, MD;  Location: [place];  Service: Endoscopy;  Laterality: N/A;  Clips placed at Hepatic Flexure Polyp (2) and Transverse Colon Polyp (4) removal sites  . PORTACATH PLACEMENT Right 02/02/2019   Procedure: INSERTION PORT-A-CATH;  Surgeon: Jules Husbands, MD;  Location: [facility];  Service: General;  Laterality: Right;  . SENTINEL NODE BIOPSY Left 12/18/2014   Procedure: SENTINEL NODE BIOPSY;  Surgeon: Robert Bellow, MD;  Location: [facility];  Service: General;  Laterality: Left;    FAMILY HISTORY Family History  Problem Relation Age of Onset  . Breast cancer Sister 94  . Colon cancer Sister   . Colon cancer Mother        dx 85s  . Hypertension Mother   . Asthma Mother   . Cancer Father        voice box removed  . Cancer Maternal Grandmother        unsure type  . Colon cancer Cousin   . Cancer Cousin        unsure type       ADVANCED DIRECTIVES:    HEALTH MAINTENANCE: Social History   Tobacco Use  . Smoking status: Former Smoker    Packs/day: 0.50    Types: Cigarettes    Quit date: 07/18/2014    Years since quitting: 4.8  . Smokeless tobacco: Never Used  Substance Use Topics  . Alcohol use: No    Alcohol/week: 0.0 standard drinks  . Drug use: No    No Known Allergies  Current Outpatient Medications  Medication Sig Dispense Refill  . acetaminophen (TYLENOL) 500 MG tablet Take 500 mg by mouth every 6 (six) hours as needed for mild pain or moderate pain.     Marland Kitchen  albuterol (PROVENTIL HFA;VENTOLIN HFA) 108 (90 Base) MCG/ACT inhaler Inhale 2 puffs into the lungs every 6 (six) hours as needed for wheezing or shortness of breath. 1 Inhaler 2  . amLODipine (NORVASC) 10 MG tablet Take 1 tablet (10 mg total) by mouth daily. (Patient taking differently: Take 10 mg by mouth every morning. ) 90 tablet 0  . atorvastatin (LIPITOR) 10 MG tablet Take 1 tablet (10 mg total) by mouth every morning. 90 tablet 0  . baclofen (LIORESAL) 10 MG tablet Take 1 tablet (10 mg total) by mouth 2 (two) times daily. (Patient taking differently: Take 10 mg by mouth 3 (three) times daily as needed for muscle spasms. ) 30 each 0  . cholecalciferol (VITAMIN D) 1000 units tablet Take 1,000 Units by mouth daily.    . diphenoxylate-atropine (LOMOTIL) 2.5-0.025 MG tablet Take 1 tablet by mouth 4 (four) times daily as needed  for diarrhea or loose stools. 30 tablet 0  . fluticasone (FLONASE) 50 MCG/ACT nasal spray Place 2 sprays into both nostrils daily. 16 g 0  . gabapentin (NEURONTIN) 300 MG capsule Take 300-600 mg by mouth 4 (four) times daily as needed (neuropathic pain).     Marland Kitchen lidocaine-prilocaine (EMLA) cream Apply to affected area once 30 g 3  . Oxycodone HCl 10 MG TABS LIMIT ONE HALF TO ONE TABLET BY MOUTH 3 TO 5 TIMES DAILY IF TOLERATED    . predniSONE (DELTASONE) 50 MG tablet Take 1 tab daily for a total of 7 days. 7 tablet 0  . pyridoxine (B-6) 100 MG tablet Take 100 mg by mouth daily.    . sertraline (ZOLOFT) 100 MG tablet Take 2 tablets by mouth once daily 180 tablet 0  . vitamin B-12 (CYANOCOBALAMIN) 500 MCG tablet Take 500 mcg by mouth daily.    . ondansetron (ZOFRAN ODT) 4 MG disintegrating tablet Take 1 tablet (4 mg total) by mouth every 8 (eight) hours as needed for nausea or vomiting. (Patient not taking: Reported on 05/12/2019) 20 tablet 0  . potassium chloride SA (KLOR-CON) 20 MEQ tablet Take 1 tablet (20 mEq total) by mouth 2 (two) times daily. 60 tablet 1   Current  Facility-Administered Medications  Medication Dose Route Frequency Provider Last Rate Last Dose  . potassium chloride 40 mEq in sodium chloride 0.9 % 270 mL (0.1481 mEq/mL) infusion  40 mEq Intravenous Once [name], MD       Facility-Administered Medications Ordered in Other Visits  Medication Dose Route Frequency Provider Last Rate Last Dose  . 0.9 %  sodium chloride infusion   Intravenous Once [name], MD      . dextrose 5 % solution   Intravenous Once [name], MD      . fluorouracil ALL[facility]ANCE [phone] (ADRUCIL) 4,700 mg in sodium chloride 0.9 % 56 mL chemo infusion  2,400 mg/m2 (Treatment Plan Recorded) Intravenous 1 day or 1 dose [name], MD   4,700 mg at 02/27/19 1448  . fluorouracil [facility]IANCE [id number] (ADRUCIL) 4,700 mg in sodium chloride 0.9 % 56 mL chemo infusion  2,400 mg/m2 (Treatment Plan Recorded) Intravenous 1 day or 1 dose [name], MD      . fluorouracil ALLIANCE [phone] (ADRUCIL) chemo injection 800 mg  400 mg/m2 (Treatment Plan Recorded) Intravenous Once [name], MD      . fosaprepitant (EMEND) 150 mg, dexamethasone (DECADRON) 10 mg in sodium chloride 0.9 % 145 mL IVPB   Intravenous Once [name], MD      . heparin lock flush 100 unit/mL  500 Units Intravenous Once [name], MD      . heparin lock flush 100 unit/mL  500 Units Intravenous Once [name], MD      . Derrill Memo ON 05/30/2019] INV-atezolizumab [facility]IANCE [id number] (TECENTRIQ) 840 mg in sodium chloride 0.9 % 250 mL chemo infusion  840 mg Intravenous Once [name], MD      . leucovorin [facility]IANCE [id number] 780 mg in dextrose 5 % 250 mL infusion  400 mg/m2 (Treatment Plan Recorded) Intravenous Once [name], MD      . oxaliplatin [facility]IANCE [id number] (ELOXATIN) 125.95 mg in dextrose 5 % 500 mL chemo infusion  64.6 mg/m2 (Treatment Plan Recorded) Intravenous Once [name], MD      . palonosetron (ALOXI)  injection 0.25 mg  0.25 mg Intravenous Once [name], MD      .  sodium chloride flush (NS) 0.9 % injection 10 mL  10 mL Intravenous PRN [name], MD   10 mL at 02/27/19 0925  . sodium chloride flush (NS) 0.9 % injection 10 mL  10 mL Intravenous PRN [name], MD   10 mL at 05/29/19 0823    OBJECTIVE: Vitals:   05/29/19 0837  BP: 100/84  Pulse: 95  Resp: 16  Temp: (!) 96.8 F (36 C)  SpO2: 100%     Body mass index is 34.07 kg/m.    ECOG FS:1 - Symptomatic but completely ambulatory  General: Well-developed, well-nourished, no acute distress.  Sitting in a wheelchair. Eyes: Pink conjunctiva, anicteric sclera. HEENT: Normocephalic, moist mucous membranes. Lungs: Clear to auscultation bilaterally. Heart: Regular rate and rhythm. No rubs, murmurs, or gallops. Abdomen: Soft, nontender, nondistended. No organomegaly noted, normoactive bowel sounds. Musculoskeletal: No edema, cyanosis, or clubbing. Neuro: Alert, answering all questions appropriately. Cranial nerves grossly intact. Skin: No rashes or petechiae noted. Psych: Normal affect.   LAB RESULTS:  Lab Results  Component Value Date   NA 140 05/29/2019   K 2.9 (L) 05/29/2019   CL 108 05/29/2019   CO2 25 05/29/2019   GLUCOSE 112 (H) 05/29/2019   BUN 9 05/29/2019   CREATININE 1.14 (H) 05/29/2019   CALCIUM 9.4 05/29/2019   PROT 6.9 05/29/2019   ALBUMIN 3.7 05/29/2019   AST 24 05/29/2019   ALT 15 05/29/2019   ALKPHOS 68 05/29/2019   BILITOT 0.5 05/29/2019   GFRNONAA 49 (L) 05/29/2019   GFRAA 57 (L) 05/29/2019    Lab Results  Component Value Date   WBC 4.2 05/29/2019   NEUTROABS 2.5 05/29/2019   HGB 10.1 (L) 05/29/2019   HCT 29.8 (L) 05/29/2019   MCV 84.4 05/29/2019   PLT 116 (L) 05/29/2019     STUDIES: No results found.  ASSESSMENT: Stage IIa ER+ left breast cancer with no breast lesion and axillary lymph node metastasis. (TxN1M0)  HER-2 negative.  Now with stage IIIa colon  cancer.  PLAN:   1.  Stage IIIa colon cancer: Pathology report reviewed independently.  CT scan of her abdomen on January 02, 2019 that did not reveal metastatic disease.  CT scan of the chest on February 07, 2019 also was negative for metastatic disease.  Patient agreed to enroll in clinical trial.  Proceed with cycle 8 of 12 of FOLFOX plus Tecentriq today.  Patient will return to clinic in 2 days for pump removal and then in 2 weeks for further evaluation and consideration of cycle 9.  2. Stage IIa ER+ left breast cancer with no breast lesion and axillary lymph node metastasis: Despite no obvious breast lesion on mammogram or breast MRI, pathology and pattern of spread was consistent with primary breast cancer. CT and bone scan at time of diagnosis revealed no other evidence of malignancy.  She only received 3 cycles of Adriamycin and Cytoxan. Taxol was discontinued early as well secondary to persistent peripheral neuropathy. Patient completed her chemotherapy on Nov 19, 2014.  She elected not to pursue axillary node dissection given the potential morbidity of this procedure. It was also elected not to pursue adjuvant XRT given there was no primary breast lesion.  Patient completed approximately 4 years of treatment with letrozole, but this has been discontinued secondary to enrolling in clinical trial for her newly diagnosed colon cancer.  Her most recent mammogram on June 29, 2018 was reported as BI-RADS 2.  Repeat in December 2020.  Continue to  monitor throughout treatment for colon cancer. 3.  Bone health: Patient's most recent bone mineral density on February 03, 2018 reported T score of -0.7 which is unchanged from 3 years prior.  Consider repeat bone mineral density in July 2021.  4.  Lynch syndrome: Genetic testing confirmed patient has Lynch syndrome.  She reports her sister and son both have tested positive.  She plans to talk to the rest of her children about genetic testing. 5.  Endometrial cancer:  Patient has had a total hysterectomy. 6.  Pain/Peripheral neuropathy: Chronic and unchanged.  Monitor. 7. Anemia: Chronic and unchanged.  Patient's hemoglobin is 10.1. 8.  Thrombocytopenia: Platelet 116, proceed with treatment as above. 9.  Renal insufficiency: Improved.  Encouraged fluid intake. 10.  Hypokalemia: Patient received 40 mEq IV potassium today.  She was also given a refill of her oral potassium supplementation 20 mEq twice per day. 11.  Leukopenia: Resolved. 12.  Diarrhea: Chronic and unchanged.  Continue Imodium as needed.  Patient expressed understanding and was in agreement with this plan. She also understands that She can call clinic at any time with any questions, concerns, or complaints.   Breast cancer metastasized to axillary lymph node   Staging form: Breast, AJCC 7th Edition     Clinical stage from 10/22/2014: Stage Unknown (TX, N1, M0) - Signed by [name], MD on 10/22/2014   [name], MD   05/29/2019 9:50 AM

## 2019-05-29 ENCOUNTER — Other Ambulatory Visit: Payer: Self-pay

## 2019-05-29 ENCOUNTER — Encounter: Payer: Self-pay | Admitting: *Deleted

## 2019-05-29 ENCOUNTER — Inpatient Hospital Stay: Payer: Medicare Other

## 2019-05-29 ENCOUNTER — Inpatient Hospital Stay (HOSPITAL_BASED_OUTPATIENT_CLINIC_OR_DEPARTMENT_OTHER): Payer: Medicare Other | Admitting: Oncology

## 2019-05-29 VITALS — BP 100/84 | HR 95 | Temp 96.8°F | Resp 16 | Wt 195.4 lb

## 2019-05-29 VITALS — BP 141/95 | HR 72 | Temp 97.6°F | Resp 18

## 2019-05-29 DIAGNOSIS — C184 Malignant neoplasm of transverse colon: Secondary | ICD-10-CM

## 2019-05-29 DIAGNOSIS — Z5111 Encounter for antineoplastic chemotherapy: Secondary | ICD-10-CM | POA: Diagnosis not present

## 2019-05-29 DIAGNOSIS — Z006 Encounter for examination for normal comparison and control in clinical research program: Secondary | ICD-10-CM | POA: Diagnosis not present

## 2019-05-29 DIAGNOSIS — E876 Hypokalemia: Secondary | ICD-10-CM

## 2019-05-29 DIAGNOSIS — C189 Malignant neoplasm of colon, unspecified: Secondary | ICD-10-CM

## 2019-05-29 LAB — CBC WITH DIFFERENTIAL/PLATELET
Abs Immature Granulocytes: 0.01 10*3/uL (ref 0.00–0.07)
Basophils Absolute: 0 10*3/uL (ref 0.0–0.1)
Basophils Relative: 1 %
Eosinophils Absolute: 0.1 10*3/uL (ref 0.0–0.5)
Eosinophils Relative: 1 %
HCT: 29.8 % — ABNORMAL LOW (ref 36.0–46.0)
Hemoglobin: 10.1 g/dL — ABNORMAL LOW (ref 12.0–15.0)
Immature Granulocytes: 0 %
Lymphocytes Relative: 29 %
Lymphs Abs: 1.2 10*3/uL (ref 0.7–4.0)
MCH: 28.6 pg (ref 26.0–34.0)
MCHC: 33.9 g/dL (ref 30.0–36.0)
MCV: 84.4 fL (ref 80.0–100.0)
Monocytes Absolute: 0.4 10*3/uL (ref 0.1–1.0)
Monocytes Relative: 9 %
Neutro Abs: 2.5 10*3/uL (ref 1.7–7.7)
Neutrophils Relative %: 60 %
Platelets: 116 10*3/uL — ABNORMAL LOW (ref 150–400)
RBC: 3.53 MIL/uL — ABNORMAL LOW (ref 3.87–5.11)
RDW: 19.9 % — ABNORMAL HIGH (ref 11.5–15.5)
WBC: 4.2 10*3/uL (ref 4.0–10.5)
nRBC: 0.5 % — ABNORMAL HIGH (ref 0.0–0.2)

## 2019-05-29 LAB — COMPREHENSIVE METABOLIC PANEL
ALT: 15 U/L (ref 0–44)
AST: 24 U/L (ref 15–41)
Albumin: 3.7 g/dL (ref 3.5–5.0)
Alkaline Phosphatase: 68 U/L (ref 38–126)
Anion gap: 7 (ref 5–15)
BUN: 9 mg/dL (ref 8–23)
CO2: 25 mmol/L (ref 22–32)
Calcium: 9.4 mg/dL (ref 8.9–10.3)
Chloride: 108 mmol/L (ref 98–111)
Creatinine, Ser: 1.14 mg/dL — ABNORMAL HIGH (ref 0.44–1.00)
GFR calc Af Amer: 57 mL/min — ABNORMAL LOW (ref >60)
GFR calc non Af Amer: 49 mL/min — ABNORMAL LOW (ref >60)
Glucose, Bld: 112 mg/dL — ABNORMAL HIGH (ref 70–99)
Potassium: 2.9 mmol/L — ABNORMAL LOW (ref 3.5–5.1)
Sodium: 140 mmol/L (ref 135–145)
Total Bilirubin: 0.5 mg/dL (ref 0.3–1.2)
Total Protein: 6.9 g/dL (ref 6.5–8.1)

## 2019-05-29 MED ORDER — DEXTROSE 5 % IV SOLN
Freq: Once | INTRAVENOUS | Status: AC
  Administered 2019-05-29: 11:00:00 via INTRAVENOUS
  Filled 2019-05-29: qty 250

## 2019-05-29 MED ORDER — SODIUM CHLORIDE 0.9 % IV SOLN: 40.0000 meq | Freq: Once | INTRAVENOUS | Status: DC

## 2019-05-29 MED ORDER — SODIUM CHLORIDE 0.9 % IV SOLN
40.0000 meq | Freq: Once | INTRAVENOUS | Status: AC
  Administered 2019-05-29: 40 meq via INTRAVENOUS
  Filled 2019-05-29: qty 20

## 2019-05-29 MED ORDER — POTASSIUM CHLORIDE CRYS ER 20 MEQ PO TBCR: 20.0000 meq | EXTENDED_RELEASE_TABLET | Freq: Two times a day (BID) | ORAL | 1 refills | Status: DC

## 2019-05-29 MED ORDER — SODIUM CHLORIDE 0.9 % IV SOLN
Freq: Once | INTRAVENOUS | Status: DC
  Filled 2019-05-29: qty 250

## 2019-05-29 MED ORDER — SODIUM CHLORIDE 0.9 % IV SOLN
Freq: Once | INTRAVENOUS | Status: AC
  Administered 2019-05-29: 10:00:00 via INTRAVENOUS
  Filled 2019-05-29: qty 250

## 2019-05-29 MED ORDER — FLUOROURACIL CHEMO INJECTION 2.5 GM/50ML ALLIANCE A021502
400.0000 mg/m2 | Freq: Once | INTRAVENOUS | Status: AC
  Administered 2019-05-29: 800 mg via INTRAVENOUS
  Filled 2019-05-29: qty 16

## 2019-05-29 MED ORDER — POTASSIUM CHLORIDE 20 MEQ/100ML IV SOLN: 20.0000 meq | Freq: Once | INTRAVENOUS | Status: DC

## 2019-05-29 MED ORDER — SODIUM CHLORIDE 0.9 % IV SOLN
2400.0000 mg/m2 | INTRAVENOUS | Status: DC
  Administered 2019-05-29: 4700 mg via INTRAVENOUS
  Filled 2019-05-29: qty 94

## 2019-05-29 MED ORDER — OXALIPLATIN CHEMO INJECTION 100 MG/20ML FOR ALLIANCE A021502
64.6000 mg/m2 | Freq: Once | INTRAVENOUS | Status: AC
  Administered 2019-05-29: 125.95 mg via INTRAVENOUS
  Filled 2019-05-29: qty 25.19

## 2019-05-29 MED ORDER — SODIUM CHLORIDE 0.9 % IV SOLN
Freq: Once | INTRAVENOUS | Status: AC
  Administered 2019-05-29: 11:00:00 via INTRAVENOUS
  Filled 2019-05-29: qty 5

## 2019-05-29 MED ORDER — PALONOSETRON HCL INJECTION 0.25 MG/5ML
0.2500 mg | Freq: Once | INTRAVENOUS | Status: AC
  Administered 2019-05-29: 0.25 mg via INTRAVENOUS
  Filled 2019-05-29: qty 5

## 2019-05-29 MED ORDER — SODIUM CHLORIDE 0.9 % IV SOLN
840.0000 mg | Freq: Once | INTRAVENOUS | Status: AC
  Administered 2019-05-29: 840 mg via INTRAVENOUS
  Filled 2019-05-29: qty 14

## 2019-05-29 MED ORDER — SODIUM CHLORIDE 0.9% FLUSH
10.0000 mL | INTRAVENOUS | Status: DC | PRN
  Administered 2019-05-29: 10 mL via INTRAVENOUS
  Filled 2019-05-29: qty 10

## 2019-05-29 MED ORDER — LEUCOVORIN 350 MG INJECTION FOR ALLIANCE A021502
400.0000 mg/m2 | Freq: Once | INTRAVENOUS | Status: AC
  Administered 2019-05-29: 780 mg via INTRAVENOUS
  Filled 2019-05-29: qty 39

## 2019-05-29 NOTE — Research (Addendum)
Patient Carrie Alvarez returns to clinic again this morning for her C8D1 treatment on the [id number] [name] study. Local labs were collected via port-a-cath per protocol and CBC and chemistry values were reviewed by Dr. Grayland Ormond. Patient has low potassium this morning - 2.80mmol/L and Dr. Grayland Ormond will give her IV potassium supplement while in clinic, and an oral potassium supplement to take at home. Other lab values remain within acceptable range for patient to receive her treatment this morning.  VS stable and SpO2 remains at 100% on room air this morning with patient wearing a mask. Patient reports her abdominal pain has returned and though it is not severe, it is largely centered around her surgical site. States the pain is intermittant and does not interfere with her ADLs, but her fatigue remains significant and she does report not being able to do much of anything due to her level of fatigue - partially from her current chemotherapy and partially from baseline. States her diarrhea has returned and reports having 3-4 BMs daily - some formed, some very soft and some are watery - still largely occurring after she eats. Solicited adverse events were assessed by this RN again today and Carrie Alvarez completed the C8 PRO-CTCAE booklet while in clinic, prior to her infusion. Patient remains in a wheelchair only while in clinic due to her ongoing issues with fatigue, but states she is completely ambulatory and uses a cane while at home. Reports her neuropathy in fingertips and toes remains mild and consists of some tingling and numbness, but she does still report cold sensitivity for about 10 days following the Oxaliplatin infusion. Patient remains on level-1 dose reduced Oxaliplatin. Tenderness in the palms of her hands and the bottoms of her feet continues, and her skin remains darker in these areas. Patient's weight remains consistent with her baseline weight. Serum creatinine slightly elevated today at at  1.14mg /dL, and CrCl is 45mL/min.. Platelet count has decreaseed to 116,000, still a grade 1. Patient's anemia remains at grade I with Hgb at 10.1g/dL following her chemotherapy 2 weeks ago. H&P completed by Dr. Grayland Ormond and patient will proceed with Cycle 8 mFOLFOX6 + atezolizumab today. Solicited and other Adverse events with grade and attribution as noted below.   Adverse Event Log  Study/Protocol: [id number] [name] Cycle: Cycle 7 Event Grade Onset Date Resolved Date Drug Name Attribution Treatment Comments  Diarrhea 1 05/18/2019 01/10/2019  05/02/2019 n/a surgery Imodium Lomotil Increased to 3-4 stools/day now - some are watery  Abdominal pain 2 04/08/2019 01/10/2019 04/12/2019 04/08/2019 n/a surgery Oxycodone Reports pain resolved  Constipation 0      Denies  Nausea 0      None in 2-3 weeks  Fatigue 2 06/12/2018 approx   ?  Reports no energy in past 6-9 months  Anorexia 1 07/13/2008   ?  States this is her norm for several years  Cough 0        Dyspnea 0        Fever 0        UTI 0        Palmer-plantar erythrodesia 1 03/13/2019    otc cream Bilateral palms and anterior fingers discolored and tender  Total biliruben 0        Neutrophils 0        TSH 0      No history of hypothyroidism  Sensory Peripheral Neuropathy 2 11/19/2014   Paclitaxel gabapentin Tingling in fingertips and feet bil. since taking Taxol  Hypokalemia 1 03/27/2019 02/27/2019  04/17/2019 03/13/2019  probable none K+ up to 3.5mg /dL today  Anemia 1 05/01/2019     Hgb 10.2  Anemia 2 02/27/2019 04/17/2019  probable none Hgb 12.4  Elevated creatinine 1 02/07/2019   unrelated  Elevated at baseline CrCl: 52mL/min  Platelet count   decreased 1 05/29/2019 03/27/2019  04/17/2019  definitely none Plts 116,000 today -   Possible Colitis 2 04/08/2019 04/12/2019  probable Steroids Resolved  Weight Loss 1 04/10/2019 04/17/2019  probable none Weight back to normal    Proteinuria 1 05/15/2019    none Urine dipstick only   Abdominal pain 1 05/22/2019   unlikely  Occasional incisional pain again  Hypokalemia 3 05/29/2019   probable IV + oral K+ supplement K+ is 2.9 mmol/L today  Carrie Alvarez, BSN, MHA, OCN 05/29/2019 10:02 AM

## 2019-05-29 NOTE — Progress Notes (Signed)
Patient is here for follow up, she mentions she has no appetite, mentions she feels very odd like out of body.

## 2019-05-29 NOTE — Progress Notes (Signed)
Clarified treatment with Olen Cordial, Research RN prior to treatment. Per Ulis Rias RN proceed with scheduled treatment, pt to also receive Potassium per Dr. Grayland Ormond.

## 2019-05-30 LAB — THYROID PANEL WITH TSH
Free Thyroxine Index: 1.8 (ref 1.2–4.9)
T3 Uptake Ratio: 25 % (ref 24–39)
T4, Total: 7 ug/dL (ref 4.5–12.0)
TSH: 2.57 u[IU]/mL (ref 0.450–4.500)

## 2019-05-31 ENCOUNTER — Other Ambulatory Visit: Payer: Self-pay

## 2019-05-31 ENCOUNTER — Inpatient Hospital Stay: Payer: Medicare Other

## 2019-05-31 VITALS — BP 90/68 | HR 86 | Temp 98.1°F | Resp 18

## 2019-05-31 DIAGNOSIS — Z5111 Encounter for antineoplastic chemotherapy: Secondary | ICD-10-CM | POA: Diagnosis not present

## 2019-05-31 DIAGNOSIS — C184 Malignant neoplasm of transverse colon: Secondary | ICD-10-CM

## 2019-05-31 MED ORDER — SODIUM CHLORIDE 0.9% FLUSH
10.0000 mL | INTRAVENOUS | Status: DC | PRN
  Administered 2019-05-31: 10 mL
  Filled 2019-05-31: qty 10

## 2019-05-31 MED ORDER — HEPARIN SOD (PORK) LOCK FLUSH 100 UNIT/ML IV SOLN
500.0000 [IU] | Freq: Once | INTRAVENOUS | Status: AC | PRN
  Administered 2019-05-31: 500 [IU]
  Filled 2019-05-31: qty 5

## 2019-06-01 ENCOUNTER — Other Ambulatory Visit: Payer: Self-pay

## 2019-06-01 ENCOUNTER — Inpatient Hospital Stay: Payer: Medicare Other | Admitting: Licensed Clinical Social Worker

## 2019-06-01 DIAGNOSIS — Z1379 Encounter for other screening for genetic and chromosomal anomalies: Secondary | ICD-10-CM

## 2019-06-01 DIAGNOSIS — Z1509 Genetic susceptibility to other malignant neoplasm: Secondary | ICD-10-CM

## 2019-06-01 DIAGNOSIS — Z803 Family history of malignant neoplasm of breast: Secondary | ICD-10-CM

## 2019-06-01 DIAGNOSIS — Z800 Family history of malignant neoplasm of digestive organs: Secondary | ICD-10-CM

## 2019-06-01 NOTE — Progress Notes (Signed)
Genetic Test Results  HPI:  Ms. Kang was previously seen in the [place] clinic due to a personal and family history of cancer, and reported Lynch syndrome, and concerns regarding a hereditary predisposition to cancer. Please refer to our prior cancer genetics clinic note for more information regarding our discussion, assessment and recommendations, at the time. Ms. Blyden recent genetic test results were disclosed to her, as were recommendations warranted by these results. These results and recommendations are discussed in more detail below.  At the age of 68, Ms. Castrillo was diagnosed with uterine cancer. She had a hysterectomy and ovaries removed.   At the age of 68, Ms. Cuffe had breast cancer that was treated with lumpectomy and chemotherapy.  At the age of 68, Ms. Huron was diagnosed with colon cancer. This was treated with colectomy and chemotherapy. Tumor testing revealed loss of MLH1 and PMS2, BRAF negative and MLH1 hypermethylation absent.   CANCER HISTORY:  Oncology History  Colon cancer ([place])  01/10/2019 Initial Diagnosis   Colon cancer ([place])   01/19/2019 Cancer Staging   Staging form: Colon and Rectum, AJCC 8th Edition - Pathologic stage from 01/19/2019: Stage IIIA (pT1, pN1a, cM0) - Signed by [name], MD on 01/19/2019     FAMILY HISTORY:  We obtained a detailed, 4-generation family history.  Significant diagnoses are listed below: Family History  Problem Relation Age of Onset  . Breast cancer Sister 66  . Colon cancer Sister   . Colon cancer Mother        dx 55s  . Hypertension Mother   . Asthma Mother   . Cancer Father        voice box removed  . Cancer Maternal Grandmother        unsure type  . Colon cancer Cousin   . Cancer Cousin        unsure type    Ms. Kovacevic has 2 sons, ages 68 and 63, and 1 daughter, age 46. She reports her son goes to the [employer] [place] in [place] and recently had a colectomy, but she is unsure if he  had colon cancer or polyps, or if he has had genetic testing. Ms. Jaquess has 3 sisters and 1 brother. One of her sisters had breast cancer at 92 and colon cancer at some point after the breast, and is living at 54. This sister reportedly tested positive for Lynch syndrome.   Ms. Pinon mother was diagnosed with colon cancer in her 23s and died 2 years ago at age 57. Patient had 3 maternal uncles and 1 maternal aunt, no known cancers. Two daughters of her aunt (patient's cousins) had cancer. One of them had colon cancer and the other she is unsure of the cancer type. Patient's maternal grandmother also had cancer but she is unsure the type. Maternal grandfather passed at 102.  Ms. Karren has limited information about her father's side. She believes her father had cancer, he had his voice box removed. Patient had 3 paternal uncles, 2 paternal aunts, no cancers she is aware of. No known cancers in her paternal cousins. She does not have information about paternal grandparents.   Ms. Witts is aware of previous family history of genetic testing for hereditary cancer risks.  There is no reported Ashkenazi Jewish ancestry. There is no known consanguinity.  GENETIC TEST RESULTS: Genetic testing reported out on 11/62020 through the [facility] Common Hereditary cancer panel identified a pathogenic variant in the MLH1 gene called c.298C>T, confirming a  diagnosis of Lynch syndrome. The remainder of the panel was negative.  The Common Hereditary Cancers Panel offered by [facility] includes sequencing and/or deletion duplication testing of the following 48 genes: APC, ATM, AXIN2, BARD1, BMPR1A, BRCA1, BRCA2, BRIP1, CDH1, CDKN2A (p14ARF), CDKN2A (p16INK4a), CKD4, CHEK2, CTNNA1, DICER1, EPCAM (Deletion/duplication testing only), GREM1 (promoter region deletion/duplication testing only), KIT, MEN1, MLH1, MSH2, MSH3, MSH6, MUTYH, NBN, NF1, NHTL1, PALB2, PDGFRA, PMS2, POLD1, POLE, PTEN, RAD50, RAD51C, RAD51D,  RNF43, SDHB, SDHC, SDHD, SMAD4, SMARCA4. STK11, TP53, TSC1, TSC2, and VHL.  The following genes were evaluated for sequence changes only: SDHA and HOXB13 c.251G>A variant only.  The test report has been scanned into EPIC and is located under the Molecular Pathology section of the Results Review tab.  A portion of the result report is included below for reference.     Genetic testing did identify a variant of uncertain significance (VUS) was identified in the BRIP1 gene called c.370A>C.  At this time, it is unknown if this variant is associated with increased cancer risk or if this is a normal finding, but most variants such as this get reclassified to being inconsequential. It should not be used to make medical management decisions. With time, we suspect the lab will determine the significance of this variant, if any. If we do learn more about it, we will try to contact Ms. Harnois to discuss it further. However, it is important to stay in touch with [place] periodically and keep the address and phone number up to date.  DISCUSSION: MLH1 Lynch Syndrome  We discussed that this result explains the cancers that Ms. Landi has had. We reviewed Lynch syndrome in detail, including cancers associated and management guidelines, and made plans for testing her children.   Clinical condition Lynch syndrome is characterized by an increased risk of colorectal cancer as well as cancers of the endometrium, ovary, prostate, stomach, small intestine, hepatobiliary tract, urinary tract, pancreas and brain. Lynch syndrome tumors typically demonstrate microsatellite instability (MSI) as well as loss of expression of the mismatch repair proteins on immunohistochemical (IHC) staining. This condition is caused by pathogenic variants in one of the mismatch repair genes--MLH1, MSH2, MSH6, PMS2--as well as deletions in the 3' end of the EPCAM gene.  See the table below from NCCN Genetic/Familial High Risk Assessment:  Colorectal version 1.2020 for MLH1 specific risks:    Inheritance Lynch syndrome has autosomal dominant inheritance. This means that an individual with a pathogenic variant has a 50% chance of passing the condition on to their offspring. Once a pathogenic mutation is detected in an individual, it is possible to identify at-risk relatives who can pursue testing for this specific familial variant. Many cases are inherited from a parent, but some can occur spontaneously (i.e., an individual with a pathogenic variant has parents who do not have it); however, that individual now has a 50% risk of passing it on to future offspring.  Additionally, individuals with a pathogenic variant in one of the MMR genes (MLH1, MSH2, MSH6, PMS2) are carriers of constitutional mismatch repair deficiency (CMMR-D). CMMR-D is a childhood-onset cancer predisposition syndrome that can present with hematological malignancies, cancers of the brain and central nervous system, Lynch syndrome-associated cancers (colon, uterine, small bowel, urinary tract), embryonic tumors, and sarcomas. Some affected individuals may also display some features of neurofibromatosis type 1--most often cafe-au-lait macules (PMID: 62035597, 41638453). For there to be a risk of CMMR-D in offspring, an individual and their partner would each have to have a single pathogenic variant  in the same MMR gene; in such a case, the risk of having an affected child is 25%.  Management  Surveillance guidelines, as published by the [employer]  (NCCN), suggest the following for individuals with MLH1 Lynch syndrome (*NCCN. Genetic/Familial High-Risk Assessment: Colorectal. Version 1.2020):  Colon cancer -High-quality olonoscopy beginning at age 26-25 or 2-5 years prior to earliest colon cancer diagnosis -Repeat every 1-2 years -There are data to suggest aspirin 600 mg/daily for at least 2 years may decrease risk of colon cancer in  Lynch syndrome, decision to use aspirin should be made on individualized basis   -Ms. Larrabee is being treated currently for colon cancer  Endometrial cancer -Education on symptoms such as abnormal bleeding -Hysterectomy can be considered -Timing should be individualized based on when childbearing is complete, family history, comorbidities, and specific gene mutations. -Screening via endometrial biopsy every 1-2 years can be considered starting at age 57-35. Transvaginal ultrasound may be considered at the physician's discretion.  Ovarian cancer -Bilateral salpingo-oophorectomy is a risk-reducing option that can be considered. -Timing should be individualized based on when childbearing is complete, family history, comorbidities, and specific gene mutations. - Women should be educated based on the signs and symptoms of ovarian cancer (such as pelvic or abdominal pain, bloating, early satiety, or urinary frequency or urgency). -Transvaginal ultrasound can be considered at clinicians discretion.  -Ms. Prehn has already had hysterectomy and BSO  Gastric/small bowel cancer -There are no clear data to support surveillance for gastric, duodenal, and small bowel cancers in [place]. Select individuals with family history of these cancers or those of Asian descent may benefit from surveillance. - Risk factors include female sex, older age, MLH1 or MSH2 path variant, first degree relative with gastric cancer, Asian ethnicity, immigrant from countries with high background incidence of gastric cance. - If surveillance is performed, may consider upper endoscopy with visualization of the duodenum at the time of colonoscopy every 3-5 years beginning at 47. Consider testing and treating H. Pylori.  -Ms. Furio does not believe she's ever had an endoscopy  Urothelial cancer -Selected individuals such as those with family history of urothelial cancer or MSH2 mutation may want to consider screening.  Surveillance options may include urinalysis starting at 30-35; however, there is insufficient evidence to recommend a surveillance strategy.  Central nervous system cancer -Consider annual physical/neurological examination starting at 25-30 years.  Pancreatic cancer -Consider screening beginning at age 58 (or 70 years younger than earliest exocrine pancreatic cancer in family) for individuals with >1 first or second degree relative on same side of family as pathogenic mutation -For individuals considering pancreatic screening, annual contrast-enhanced MRI and/or EUS  -Ms. Ortego does not report family history of pancreatic cancer  Prostate cancer -At present, insufficient evidence to recommend earlier or more frequent prostate cancer screening  An individual's cancer risk and medical management are not determined by genetic test results alone. Overall cancer risk assessment incorporates additional factors including personal medical history, family history as well as available genetic information that may result in a personalized plan for cancer prevention and surveillance.  FAMILY MEMBERS: It is important that all of Ms. Bertoni's relatives (both men and women) know of the presence of this gene mutation. Site-specific genetic testing can sort out who in the family is at risk and who is not.   Ms. Dullea children and siblings have a 50% chance to have inherited this same mutation. Her daughter, Verl Blalock, is interested in testing and will see me 11/30  at 11 am when she brings Ms. Focht for her treatment. She reports her eldest son already had testing through the [facility] and tested positive. Her middle son, Ferman Hamming, is also interested in testing and will be contacted to set up a genetic counseling appointment with me. Ms. Lizardo reports that her other siblings are aware of  Lynch syndrome in the family, and she will let her maternal cousins know as we suspect the mutation is  coming from her mother's side of the family, though she is aware we cannot know for sure until testing happens.   PLAN:  1. These results will bemade available to Dr. Grayland Ormond. Ms. Freet would like Dr. Grayland Ormond to follow her long-term for this indication and coordinate screening.  2. Ms. Tuckett has already discussed this result with some family members, and I will be in contact with her two children who have not had testing yet to coordinate testing for them. She is aware that family members can have testing at no cost if they have a copy of her test report, and they get tested within 150 days from the report date.   SUPPORT AND RESOURCES: If Ms. Marrocco is interested in Lynch-specific information and support, we gave her information on [employer] as well as [employer]. To locate genetic counselors in other cities, visit the website of the [employer] (Artist[employer].se) and Secretary/administrator for a Social worker by zip code.  We encouraged Ms. Scribner to remain in contact with [place] on an annual basis so we can update her personal and family histories, and let her know of advances in cancer genetics that may benefit the family. Our contact number was provided. Ms. Gudgel questions were answered to her satisfaction today, and she knows she is welcome to call anytime with additional questions.    Faith Rogue, MS, [facility] Genetic Counselor [email or web address] Phone: [phone]

## 2019-06-06 ENCOUNTER — Other Ambulatory Visit: Payer: Self-pay

## 2019-06-06 ENCOUNTER — Inpatient Hospital Stay (HOSPITAL_BASED_OUTPATIENT_CLINIC_OR_DEPARTMENT_OTHER): Payer: Medicare Other | Admitting: Oncology

## 2019-06-06 ENCOUNTER — Telehealth: Payer: Self-pay | Admitting: *Deleted

## 2019-06-06 DIAGNOSIS — C184 Malignant neoplasm of transverse colon: Secondary | ICD-10-CM | POA: Diagnosis not present

## 2019-06-06 NOTE — Progress Notes (Signed)
Patient prescreened for appointment. Patient has no concerns or questions.  

## 2019-06-06 NOTE — Telephone Encounter (Signed)
Patient called and states that Dr Primus Bravo told her she needs to be seen by Dr Grayland Ormond  Today. She reports that he will not refill her medicine until she is seen. She reports that she is sleeping 12 - 18 hours a day, She doe snot report any other symptoms. Her last chemotherapy was on 11/18 and her hgb was 10,1 at that time Please advise

## 2019-06-06 NOTE — Telephone Encounter (Signed)
I can video visit this afternoon.

## 2019-06-06 NOTE — Telephone Encounter (Signed)
Patient Carrie Alvarez called stating she had just left Dr. Alphonzo Cruise office and was told by him that she needs to see Dr. Grayland Ormond because she cannot stay awake. Patient is being driven back home from her appointment and wants to be seen here at the [facility] if possible. Spoke with Dr. Grayland Ormond, who states he will conduct a video visit with patient this afternoon. Appt scheduled by Quentin Angst for Video visit with Dr. Grayland Ormond at 1:30 this afternoon. Patient called and informed of her appointment and states she has had video visits before. Informed her to wait for Dr. Grayland Ormond to send her a text message at 1:30 this afternoon and she agrees. [name], BSN, MHA, OCN 06/06/2019 11:58 AM

## 2019-06-07 ENCOUNTER — Emergency Department
Admission: EM | Admit: 2019-06-07 | Discharge: 2019-06-07 | Disposition: A | Discharge status: Home / Self Care | Payer: Medicare Other | Attending: Emergency Medicine | Admitting: Emergency Medicine

## 2019-06-07 ENCOUNTER — Other Ambulatory Visit: Payer: Self-pay

## 2019-06-07 ENCOUNTER — Telehealth: Payer: Self-pay | Admitting: *Deleted

## 2019-06-07 ENCOUNTER — Emergency Department: Payer: Medicare Other

## 2019-06-07 ENCOUNTER — Encounter: Payer: Self-pay | Admitting: Emergency Medicine

## 2019-06-07 DIAGNOSIS — Z20828 Contact with and (suspected) exposure to other viral communicable diseases: Secondary | ICD-10-CM | POA: Insufficient documentation

## 2019-06-07 DIAGNOSIS — Z87891 Personal history of nicotine dependence: Secondary | ICD-10-CM | POA: Insufficient documentation

## 2019-06-07 DIAGNOSIS — R112 Nausea with vomiting, unspecified: Secondary | ICD-10-CM | POA: Diagnosis present

## 2019-06-07 DIAGNOSIS — Z853 Personal history of malignant neoplasm of breast: Secondary | ICD-10-CM | POA: Insufficient documentation

## 2019-06-07 DIAGNOSIS — C189 Malignant neoplasm of colon, unspecified: Secondary | ICD-10-CM | POA: Insufficient documentation

## 2019-06-07 DIAGNOSIS — R519 Headache, unspecified: Secondary | ICD-10-CM | POA: Diagnosis not present

## 2019-06-07 DIAGNOSIS — I10 Essential (primary) hypertension: Secondary | ICD-10-CM | POA: Diagnosis not present

## 2019-06-07 DIAGNOSIS — E860 Dehydration: Secondary | ICD-10-CM | POA: Insufficient documentation

## 2019-06-07 DIAGNOSIS — Z79899 Other long term (current) drug therapy: Secondary | ICD-10-CM | POA: Insufficient documentation

## 2019-06-07 DIAGNOSIS — R197 Diarrhea, unspecified: Secondary | ICD-10-CM | POA: Insufficient documentation

## 2019-06-07 LAB — COMPREHENSIVE METABOLIC PANEL
ALT: 18 U/L (ref 0–44)
AST: 28 U/L (ref 15–41)
Albumin: 4.3 g/dL (ref 3.5–5.0)
Alkaline Phosphatase: 76 U/L (ref 38–126)
Anion gap: 13 (ref 5–15)
BUN: 13 mg/dL (ref 8–23)
CO2: 21 mmol/L — ABNORMAL LOW (ref 22–32)
Calcium: 10.6 mg/dL — ABNORMAL HIGH (ref 8.9–10.3)
Chloride: 107 mmol/L (ref 98–111)
Creatinine, Ser: 1.09 mg/dL — ABNORMAL HIGH (ref 0.44–1.00)
GFR calc Af Amer: >60 mL/min (ref >60)
GFR calc non Af Amer: 52 mL/min — ABNORMAL LOW (ref >60)
Glucose, Bld: 115 mg/dL — ABNORMAL HIGH (ref 70–99)
Potassium: 4.3 mmol/L (ref 3.5–5.1)
Sodium: 141 mmol/L (ref 135–145)
Total Bilirubin: 0.6 mg/dL (ref 0.3–1.2)
Total Protein: 8.2 g/dL — ABNORMAL HIGH (ref 6.5–8.1)

## 2019-06-07 LAB — CBC
HCT: 32.5 % — ABNORMAL LOW (ref 36.0–46.0)
Hemoglobin: 11 g/dL — ABNORMAL LOW (ref 12.0–15.0)
MCH: 28.9 pg (ref 26.0–34.0)
MCHC: 33.8 g/dL (ref 30.0–36.0)
MCV: 85.5 fL (ref 80.0–100.0)
Platelets: 146 10*3/uL — ABNORMAL LOW (ref 150–400)
RBC: 3.8 MIL/uL — ABNORMAL LOW (ref 3.87–5.11)
RDW: 18.1 % — ABNORMAL HIGH (ref 11.5–15.5)
WBC: 5 10*3/uL (ref 4.0–10.5)
nRBC: 1.4 % — ABNORMAL HIGH (ref 0.0–0.2)

## 2019-06-07 LAB — POC SARS CORONAVIRUS 2 AG -  ED: SARS Coronavirus 2 Ag: NEGATIVE

## 2019-06-07 MED ORDER — ONDANSETRON HCL 4 MG/2ML IJ SOLN
4.0000 mg | Freq: Once | INTRAMUSCULAR | Status: AC
  Administered 2019-06-07: 4 mg via INTRAVENOUS
  Filled 2019-06-07: qty 2

## 2019-06-07 MED ORDER — KETOROLAC TROMETHAMINE 30 MG/ML IJ SOLN
15.0000 mg | Freq: Once | INTRAMUSCULAR | Status: AC
  Administered 2019-06-07: 15 mg via INTRAVENOUS
  Filled 2019-06-07: qty 1

## 2019-06-07 MED ORDER — SODIUM CHLORIDE 0.9 % IV BOLUS
1000.0000 mL | Freq: Once | INTRAVENOUS | Status: AC
  Administered 2019-06-07: 1000 mL via INTRAVENOUS

## 2019-06-07 NOTE — Telephone Encounter (Signed)
Received t/c from patient Carrie Alvarez reporting that she has been up all night with a bad headache as well as vomiting and diarrhea. Reports she is still in a lot of pain with the headache and feels dehydrated from the vomiting and diarrhea. States Dr. Grayland Ormond has to fax an approval to Dr. Josefa Half at the pain clinic in [place] before he will release her medication Message sent to Dr. Grayland Ormond to inform of all of this. [name], BSN, MHA, OCN 06/07/2019 9:31 AM  Per Triage Nurse Jarrett Soho, Symptom management clinic is full today and patient will need to go to the ED. T/C back to patient to explain and she is willing to go to the ED. She denies having a fever or having been exposed to anyone with COVID-19. Also spoke with her husband, who states he is getting ready to take her to the ED now. Phone call made to Dr. Josefa Half at the pain management clinic per Dr. Gary Fleet request and learned that their clinic is closed until 06/12/2019. Verified with patient that she has enough of her medications usually prescribed by Dr. Josefa Half - ocycodone, gabapentin and lioresal - to last until the clinic reopens. Will re-attempt to contact them on Monday. [name], BSN, MHA, OCN 06/07/2019 10:08 AM

## 2019-06-07 NOTE — ED Provider Notes (Signed)
[facility] Emergency Department Provider Note  Time seen: 11:38 AM  I have reviewed the triage vital signs and the nursing notes.   HISTORY  Chief Complaint Headache and Emesis   HPI Carrie Alvarez is a 68 y.o. female with a past medical history of anemia, depression, gastric reflux, hypertension, hyperlipidemia currently on chemotherapy for colon cancer presents to the emergency department for headache nausea vomiting diarrhea and dehydration.  According to the patient for the past 3 days or so she has been experiencing a moderate headache.  Patient states she has been having frequent diarrhea which is somewhat typical for her, however for the past several days she has been feeling nauseated with frequent vomiting for the past 24 hours.  States she feels dehydrated.  She called her oncologist Dr. Grayland Ormond who referred her to the emergency department.  Patient denies any fever, found of a low-grade 99.6 temperature in the emergency department.  Denies any cough or shortness of breath.  Denies any abdominal pain.   Past Medical History:  Diagnosis Date  . Allergy   . Anemia   . Back pain   . Breast cancer ([place]) 2015   LT LUMPECTOMY 12-2014 FOLLOWING CHEMO  . Carpal tunnel syndrome    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  . Cataract   . Colon cancer ([place]) 2020  . Depression   . Family history of breast cancer   . Family history of colon cancer   . GERD (gastroesophageal reflux disease)    problem with reflux during chemo  . History of methicillin resistant staphylococcus aureus (MRSA)   . Hypercholesteremia   . Hypertension   . Lumbosacral pain    sees pain management in [place]  . Obesity   . Personal history of chemotherapy 2016  . Pinched nerve    left side, lumbar area  . Uterine cancer [facility]) 1994    Patient Active Problem List   Diagnosis Date Noted  . MLH1-related Lynch syndrome (HNPCC2) 06/01/2019  . Genetic testing 06/01/2019  .  Family history of breast cancer   . Family history of colon cancer   . Goals of care, counseling/discussion 01/22/2019  . Colon cancer ([place]) 01/10/2019  . Personal history of colonic polyps   . Polyp of transverse colon   . Neoplasm of digestive system   . Morbid obesity ([place]) 06/07/2018  . Acute cholecystitis   . Transaminitis   . Cholangitis 04/18/2016  . Allergic rhinitis 09/04/2015  . AA (alopecia areata) 09/04/2015  . Arthritis, degenerative 09/04/2015  . Cancer of uterus ([place]) 09/04/2015  . Avitaminosis D 09/04/2015  . Benign positional vertigo 09/04/2015  . Hypertension 03/15/2015  . DDD (degenerative disc disease), lumbar 03/07/2015  . Dizziness 12/25/2014  . Lumbosacral facet joint syndrome 12/10/2014  . Sacroiliac joint dysfunction 12/10/2014  . DDD (degenerative disc disease), lumbosacral 11/13/2014  . Depression 10/13/2014  . Breast cancer metastasized to axillary lymph node ([place]) 06/21/2014  . Hypercholesteremia 08/30/2009  . Compulsive tobacco user syndrome 04/21/2008  . Adaptation reaction 07/18/2007    Past Surgical History:  Procedure Laterality Date  . ABDOMINAL HYSTERECTOMY  1994   UTERINE CA  . AXILLARY LYMPH NODE BIOPSY Left 12/18/2014   Procedure: AXILLARY LYMPH NODE BIOPSY/;  Surgeon: Robert Bellow, MD;  Location: [facility] OR[facility];  Service: General;  Laterality: Left;  . AXILLARY LYMPH NODE DISSECTION Left 12/18/2014   Procedure: AXILLARY LYMPH NODE DISSECTION;  Surgeon: Robert Bellow, MD;  Location: [facility];  Service: General;  Laterality: Left;  . BREAST BIOPSY Left 05/2014   CORE BX OF LN, METASTATIC ADENOCARCINOMA  . BREAST LUMPECTOMY Left 12/2014   CHEMO FIRST THEN SURGERY OF LN  . BREAST SURGERY     lymph node removal  . CHOLECYSTECTOMY N/A 04/19/2016   Procedure: LAPAROSCOPIC CHOLECYSTECTOMY WITH INTRAOPERATIVE CHOLANGIOGRAM;  Surgeon: Hubbard Robinson, MD;  Location: [facility];  Service: General;  Laterality: N/A;  . COLON RESECTION Right  01/10/2019   Procedure: HAND ASSISTED LAPAROSCOPIC RIGHT COLON RESECTION;  Surgeon: Jules Husbands, MD;  Location: [facility];  Service: General;  Laterality: Right;  . COLONOSCOPY WITH PROPOFOL N/A 12/30/2018   Procedure: COLONOSCOPY WITH PROPOFOL;  Surgeon: Lucilla Lame, MD;  Location: [place];  Service: Endoscopy;  Laterality: N/A;  . medial branch block  11/06/2015   lumbar facet Dr. Primus Bravo  . POLYPECTOMY N/A 12/30/2018   Procedure: POLYPECTOMY;  Surgeon: Lucilla Lame, MD;  Location: [place];  Service: Endoscopy;  Laterality: N/A;  Clips placed at Hepatic Flexure Polyp (2) and Transverse Colon Polyp (4) removal sites  . PORTACATH PLACEMENT Right 02/02/2019   Procedure: INSERTION PORT-A-CATH;  Surgeon: Jules Husbands, MD;  Location: [facility];  Service: General;  Laterality: Right;  . SENTINEL NODE BIOPSY Left 12/18/2014   Procedure: SENTINEL NODE BIOPSY;  Surgeon: Robert Bellow, MD;  Location: [facility];  Service: General;  Laterality: Left;    Prior to Admission medications   Medication Sig Start Date End Date Taking? Authorizing Provider  acetaminophen (TYLENOL) 500 MG tablet Take 500 mg by mouth every 6 (six) hours as needed for mild pain or moderate pain.     [provider]  albuterol (PROVENTIL HFA;VENTOLIN HFA) 108 (90 Base) MCG/ACT inhaler Inhale 2 puffs into the lungs every 6 (six) hours as needed for wheezing or shortness of breath. 09/28/17   Trinna Post, PA-C  amLODipine (NORVASC) 10 MG tablet Take 1 tablet (10 mg total) by mouth daily. Patient taking differently: Take 10 mg by mouth every morning.  11/17/18   Trinna Post, PA-C  atorvastatin (LIPITOR) 10 MG tablet Take 1 tablet (10 mg total) by mouth every morning. 04/13/19   Trinna Post, PA-C  baclofen (LIORESAL) 10 MG tablet Take 1 tablet (10 mg total) by mouth 2 (two) times daily. Patient taking differently: Take 10 mg by mouth 3 (three) times daily as needed for muscle spasms.  10/28/17    Trinna Post, PA-C  cholecalciferol (VITAMIN D) 1000 units tablet Take 1,000 Units by mouth daily.    [provider]  diphenoxylate-atropine (LOMOTIL) 2.5-0.025 MG tablet Take 1 tablet by mouth 4 (four) times daily as needed for diarrhea or loose stools. 04/17/19   Jacquelin Hawking, NP  fluticasone (FLONASE) 50 MCG/ACT nasal spray Place 2 sprays into both nostrils daily. 03/23/19   Trinna Post, PA-C  gabapentin (NEURONTIN) 300 MG capsule Take 300-600 mg by mouth 4 (four) times daily as needed (neuropathic pain).     [provider]  lidocaine-prilocaine (EMLA) cream Apply to affected area once 01/22/19   [name], MD  ondansetron (ZOFRAN ODT) 4 MG disintegrating tablet Take 1 tablet (4 mg total) by mouth every 8 (eight) hours as needed for nausea or vomiting. Patient not taking: Reported on 05/12/2019 01/04/19   Pabon, [place] F, MD  Oxycodone HCl 10 MG TABS LIMIT ONE HALF TO ONE TABLET BY MOUTH 3 TO 5 TIMES DAILY IF TOLERATED 02/21/19  [provider]  potassium chloride SA (KLOR-CON) 20 MEQ tablet Take 1 tablet (20 mEq total) by mouth 2 (two) times daily. 05/29/19   [name], MD  predniSONE (DELTASONE) 50 MG tablet Take 1 tab daily for a total of 7 days. 04/10/19   Jacquelin Hawking, NP  pyridoxine (B-6) 100 MG tablet Take 100 mg by mouth daily.    [provider]  sertraline (ZOLOFT) 100 MG tablet Take 2 tablets by mouth once daily 04/13/19   Trinna Post, PA-C  vitamin B-12 (CYANOCOBALAMIN) 500 MCG tablet Take 500 mcg by mouth daily.    [provider]    No Known Allergies  Family History  Problem Relation Age of Onset  . Breast cancer Sister 22  . Colon cancer Sister   . Colon cancer Mother        dx 80s  . Hypertension Mother   . Asthma Mother   . Cancer Father        voice box removed  . Cancer Maternal Grandmother        unsure type  . Colon cancer Cousin   . Cancer Cousin        unsure type     Social History Social History   Tobacco Use  . Smoking status: Former Smoker    Packs/day: 0.50    Types: Cigarettes    Quit date: 07/18/2014    Years since quitting: 4.8  . Smokeless tobacco: Never Used  Substance Use Topics  . Alcohol use: No    Alcohol/week: 0.0 standard drinks  . Drug use: No    Review of Systems Constitutional: Negative for fever. Cardiovascular: Negative for chest pain. Respiratory: Negative for shortness of breath. Gastrointestinal: Negative for abdominal pain.  Positive for nausea vomiting and diarrhea. Genitourinary: Negative for urinary compaints Musculoskeletal: Negative for musculoskeletal complaints Skin: Negative for skin complaints  Neurological: Moderate headache.  Denies any weakness or numbness. All other ROS negative  ____________________________________________   PHYSICAL EXAM:  VITAL SIGNS: ED Triage Vitals  Enc Vitals Group     BP 06/07/19 1042 (!) 131/91     Pulse Rate 06/07/19 1042 88     Resp 06/07/19 1042 20     Temp 06/07/19 1042 99.6 F (37.6 C)     Temp Source 06/07/19 1042 Oral     SpO2 06/07/19 1042 100 %     Weight 06/07/19 1041 195 lb (88.5 kg)     Height 06/07/19 1041 5' 3"  (1.6 m)     Head Circumference --      Peak Flow --      Pain Score 06/07/19 1047 9     Pain Loc --      Pain Edu? --      Excl. in [place]? --    Constitutional: Alert and oriented. Well appearing and in no distress. Eyes: Normal exam ENT      Head: Normocephalic and atraumatic.      Mouth/Throat: Mucous membranes are moist. Cardiovascular: Normal rate, regular rhythm. Respiratory: Normal respiratory effort without tachypnea nor retractions. Breath sounds are clear Gastrointestinal: Soft and nontender. No distention.   Musculoskeletal: Nontender with normal range of motion in all extremities.  Neurologic:  Normal speech and language. No gross focal neurologic deficits.  Equal grip strengths.  No pronator drift.  No cranial nerve  deficits. Skin:  Skin is warm, dry and intact.  Psychiatric: Mood and affect are normal.   ____________________________________________  RADIOLOGY  CT head  shows no acute abnormality.  ____________________________________________   INITIAL IMPRESSION / ASSESSMENT AND PLAN / ED COURSE  Pertinent labs & imaging results that were available during my care of the patient were reviewed by me and considered in my medical decision making (see chart for details).   Patient presents emergency department for headache nausea vomiting diarrhea currently on chemotherapy for colon cancer.  Last chemotherapy was approximately 9 days ago.  We will check labs, IV hydrate, treat nausea, we will treat the headache with Toradol.  Patient does have a low-grade temperature 99.6 along with her symptoms of nausea and diarrhea we will obtain a rapid Covid swab as a precaution.  Given her history of colon cancer and headache will also obtain a CT scan of the head.  Patient agreeable to plan of care.  CT head is negative for acute abnormality.  Labs are largely within normal limits.  Patient states she is feeling much better after medications.  Patient has approximately 300 cc of fluids left we will discharge after fluids have finished with PCP follow-up.  Patient agreeable to plan of care.  LORIANNA SPADACCINI was evaluated in Emergency Department on 06/07/2019 for the symptoms described in the history of present illness. She was evaluated in the context of the global COVID-19 pandemic, which necessitated consideration that the patient might be at risk for infection with the SARS-CoV-2 virus that causes COVID-19. Institutional protocols and algorithms that pertain to the evaluation of patients at risk for COVID-19 are in a state of rapid change based on information released by regulatory bodies including the CDC and federal and state organizations. These policies and algorithms were followed during the patient's care  in the ED.  ____________________________________________   FINAL CLINICAL IMPRESSION(S) / ED DIAGNOSES  Headache Nausea vomiting Diarrhea   Harvest Dark, MD 06/07/19 [phone]

## 2019-06-07 NOTE — Progress Notes (Signed)
[place]  [phone]) [phone] [phone]) [phone]  ID: Carrie Alvarez OB: 1950/12/27  MR#: [id number]  RKY#:[id number]  Patient Care Team: Paulene Floor as PCP - General (Physician Assistant) [name], MD as Consulting Physician (Oncology) [name], MD as Attending Physician (Pain Medicine) [name]., MD as Consulting Physician (Ophthalmology) [name], RN as Oncology Nurse Navigator  CHIEF COMPLAINT: Stage IIa ER+ left breast cancer with no breast lesion and axillary lymph node metastasis.  Now with stage IIIa colon cancer.  INTERVAL HISTORY: Patient returns to clinic today for further evaluation and consideration of cycle 9 of FOLFOX plus Tecentriq on clinical trial.  Her fatigue has improved.  She continues to have chronic diarrhea that is unchanged.  She was seen in the emergency room this past week for dehydration, but felt significantly improved after IV fluids.  Her peripheral neuropathy is slightly worse.  She has no other neurologic complaints.  She has a fair appetite, but denies weight loss.  She denies any recent fevers or illnesses.  She has no chest pain, shortness of breath, cough, or hemoptysis.  She denies any nausea, vomiting, or constipation.  She has no melena or hematochezia.  She has no urinary complaints.  Patient offers no further specific complaints today.  REVIEW OF SYSTEMS:   Review of Systems  Constitutional: Positive for malaise/fatigue. Negative for fever and weight loss.  Respiratory: Negative.  Negative for cough and shortness of breath.   Cardiovascular: Negative.  Negative for chest pain and leg swelling.  Gastrointestinal: Positive for diarrhea. Negative for abdominal pain, blood in stool, constipation, melena, nausea and vomiting.  Genitourinary: Negative.  Negative for dysuria.  Musculoskeletal: Negative.  Negative for back pain.  Skin: Negative.  Negative for rash.    Neurological: Positive for sensory change and weakness. Negative for dizziness, focal weakness and headaches.  Psychiatric/Behavioral: Negative.  Negative for depression. The patient is not nervous/anxious.     As per HPI. Otherwise, a complete review of systems is negative.  PAST MEDICAL HISTORY: Past Medical History:  Diagnosis Date  . Allergy   . Anemia   . Back pain   . Breast cancer ([place]) 2015   LT LUMPECTOMY 12-2014 FOLLOWING CHEMO  . Carpal tunnel syndrome    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  . Cataract   . Colon cancer ([place]) 2020  . Depression   . Family history of breast cancer   . Family history of colon cancer   . GERD (gastroesophageal reflux disease)    problem with reflux during chemo  . History of methicillin resistant staphylococcus aureus (MRSA)   . Hypercholesteremia   . Hypertension   . Lumbosacral pain    sees pain management in [place]  . Obesity   . Personal history of chemotherapy 2016  . Pinched nerve    left side, lumbar area  . Uterine cancer ([place]) 1994    PAST SURGICAL HISTORY: Past Surgical History:  Procedure Laterality Date  . ABDOMINAL HYSTERECTOMY  1994   UTERINE CA  . AXILLARY LYMPH NODE BIOPSY Left 12/18/2014   Procedure: AXILLARY LYMPH NODE BIOPSY/;  Surgeon: Robert Bellow, MD;  Location: [facility];  Service: General;  Laterality: Left;  . AXILLARY LYMPH NODE DISSECTION Left 12/18/2014   Procedure: AXILLARY LYMPH NODE DISSECTION;  Surgeon: Robert Bellow, MD;  Location: [facility];  Service: General;  Laterality: Left;  . BREAST BIOPSY Left 05/2014   CORE BX OF LN, METASTATIC  ADENOCARCINOMA  . BREAST LUMPECTOMY Left 12/2014   CHEMO FIRST THEN SURGERY OF LN  . BREAST SURGERY     lymph node removal  . CHOLECYSTECTOMY N/A 04/19/2016   Procedure: LAPAROSCOPIC CHOLECYSTECTOMY WITH INTRAOPERATIVE CHOLANGIOGRAM;  Surgeon: Hubbard Robinson, MD;  Location: [facility];  Service: General;  Laterality: N/A;  . COLON RESECTION Right  01/10/2019   Procedure: HAND ASSISTED LAPAROSCOPIC RIGHT COLON RESECTION;  Surgeon: Jules Husbands, MD;  Location: [facility];  Service: General;  Laterality: Right;  . COLONOSCOPY WITH PROPOFOL N/A 12/30/2018   Procedure: COLONOSCOPY WITH PROPOFOL;  Surgeon: Lucilla Lame, MD;  Location: [place];  Service: Endoscopy;  Laterality: N/A;  . medial branch block  11/06/2015   lumbar facet Dr. Primus Bravo  . POLYPECTOMY N/A 12/30/2018   Procedure: POLYPECTOMY;  Surgeon: Lucilla Lame, MD;  Location: [name] [facility];  Service: Endoscopy;  Laterality: N/A;  Clips placed at Hepatic Flexure Polyp (2) and Transverse Colon Polyp (4) removal sites  . PORTACATH PLACEMENT Right 02/02/2019   Procedure: INSERTION PORT-A-CATH;  Surgeon: Jules Husbands, MD;  Location: [facility];  Service: General;  Laterality: Right;  . SENTINEL NODE BIOPSY Left 12/18/2014   Procedure: SENTINEL NODE BIOPSY;  Surgeon: Robert Bellow, MD;  Location: [facility];  Service: General;  Laterality: Left;    FAMILY HISTORY Family History  Problem Relation Age of Onset  . Breast cancer Sister 91  . Colon cancer Sister   . Colon cancer Mother        dx 72s  . Hypertension Mother   . Asthma Mother   . Cancer Father        voice box removed  . Cancer Maternal Grandmother        unsure type  . Colon cancer Cousin   . Cancer Cousin        unsure type       ADVANCED DIRECTIVES:    HEALTH MAINTENANCE: Social History   Tobacco Use  . Smoking status: Former Smoker    Packs/day: 0.50    Types: Cigarettes    Quit date: 07/18/2014    Years since quitting: 4.9  . Smokeless tobacco: Never Used  Substance Use Topics  . Alcohol use: No    Alcohol/week: 0.0 standard drinks  . Drug use: No    No Known Allergies  Current Outpatient Medications  Medication Sig Dispense Refill  . acetaminophen (TYLENOL) 500 MG tablet Take 500 mg by mouth every 6 (six) hours as needed for mild pain or moderate pain.     Marland Kitchen albuterol (PROVENTIL  HFA;VENTOLIN HFA) 108 (90 Base) MCG/ACT inhaler Inhale 2 puffs into the lungs every 6 (six) hours as needed for wheezing or shortness of breath. 1 Inhaler 2  . amLODipine (NORVASC) 10 MG tablet Take 1 tablet (10 mg total) by mouth daily. (Patient taking differently: Take 10 mg by mouth every morning. ) 90 tablet 0  . atorvastatin (LIPITOR) 10 MG tablet Take 1 tablet (10 mg total) by mouth every morning. 90 tablet 0  . baclofen (LIORESAL) 10 MG tablet Take 1 tablet (10 mg total) by mouth 2 (two) times daily. (Patient taking differently: Take 10 mg by mouth 3 (three) times daily as needed for muscle spasms. ) 30 each 0  . cholecalciferol (VITAMIN D) 1000 units tablet Take 1,000 Units by mouth daily.    . diphenoxylate-atropine (LOMOTIL) 2.5-0.025 MG tablet Take 1 tablet by mouth 4 (four) times daily as needed for diarrhea or loose stools.  30 tablet 0  . fluticasone (FLONASE) 50 MCG/ACT nasal spray Use 2 spray(s) in each nostril once daily 16 g 0  . gabapentin (NEURONTIN) 300 MG capsule Take 300-600 mg by mouth 4 (four) times daily as needed (neuropathic pain).     Marland Kitchen lidocaine-prilocaine (EMLA) cream Apply to affected area once 30 g 3  . Oxycodone HCl 10 MG TABS LIMIT ONE HALF TO ONE TABLET BY MOUTH 3 TO 5 TIMES DAILY IF TOLERATED    . potassium chloride SA (KLOR-CON) 20 MEQ tablet Take 1 tablet (20 mEq total) by mouth 2 (two) times daily. 60 tablet 1  . pyridoxine (B-6) 100 MG tablet Take 100 mg by mouth daily.    . sertraline (ZOLOFT) 100 MG tablet Take 2 tablets by mouth once daily 180 tablet 0  . vitamin B-12 (CYANOCOBALAMIN) 500 MCG tablet Take 500 mcg by mouth daily.    . ondansetron (ZOFRAN ODT) 4 MG disintegrating tablet Take 1 tablet (4 mg total) by mouth every 8 (eight) hours as needed for nausea or vomiting. (Patient not taking: Reported on 06/12/2019) 20 tablet 0  . predniSONE (DELTASONE) 50 MG tablet Take 1 tab daily for a total of 7 days. (Patient not taking: Reported on 06/12/2019) 7  tablet 0   No current facility-administered medications for this visit.    Facility-Administered Medications Ordered in Other Visits  Medication Dose Route Frequency Provider Last Rate Last Dose  . dextrose 5 % solution   Intravenous Once [name], MD      . fluorouracil ALLIANCE [phone] (ADRUCIL) 4,700 mg in sodium chloride 0.9 % 56 mL chemo infusion  2,400 mg/m2 (Treatment Plan Recorded) Intravenous 1 day or 1 dose [name], MD   4,700 mg at 02/27/19 1448  . fluorouracil ALLIANCE [id number] (ADRUCIL) 4,700 mg in sodium chloride 0.9 % 56 mL chemo infusion  2,400 mg/m2 (Treatment Plan Recorded) Intravenous 1 day or 1 dose [name], MD      . fluorouracil ALLIANCE [phone] (ADRUCIL) chemo injection 800 mg  400 mg/m2 (Treatment Plan Recorded) Intravenous Once [name], MD      . fosaprepitant (EMEND) 150 mg, dexamethasone (DECADRON) 10 mg in sodium chloride 0.9 % 145 mL IVPB   Intravenous Once [name], MD 453 mL/hr at 06/12/19 1017    . heparin lock flush 100 unit/mL  500 Units Intravenous Once [name], MD      . heparin lock flush 100 unit/mL  500 Units Intravenous Once [name], MD      . Derrill Memo ON 06/13/2019] INV-atezolizumab ALLIANCE [id number] (TECENTRIQ) 840 mg in sodium chloride 0.9 % 250 mL chemo infusion  840 mg Intravenous Once [name], MD      . leucovorin ALLIANCE [id number] 780 mg in dextrose 5 % 250 mL infusion  400 mg/m2 (Treatment Plan Recorded) Intravenous Once [name], MD      . oxaliplatin ALLIANCE [id number] (ELOXATIN) 125.95 mg in dextrose 5 % 500 mL chemo infusion  64.6 mg/m2 (Treatment Plan Recorded) Intravenous Once [name], MD      . sodium chloride flush (NS) 0.9 % injection 10 mL  10 mL Intravenous PRN [name], MD   10 mL at 02/27/19 0925    OBJECTIVE: Vitals:   06/12/19 0849  BP: (!) 88/69  Pulse: 98  Resp: 20  Temp: (!) 95.8 F (35.4 C)  SpO2: 100%      Body mass index is 34.12  kg/m.    ECOG FS:1 - Symptomatic but completely ambulatory  General: Well-developed, well-nourished, no acute distress. Eyes: Pink conjunctiva, anicteric sclera. HEENT: Normocephalic, moist mucous membranes. Lungs: Clear to auscultation bilaterally. Heart: Regular rate and rhythm. No rubs, murmurs, or gallops. Abdomen: Soft, nontender, nondistended. No organomegaly noted, normoactive bowel sounds. Musculoskeletal: No edema, cyanosis, or clubbing. Neuro: Alert, answering all questions appropriately. Cranial nerves grossly intact. Skin: No rashes or petechiae noted. Psych: Normal affect.  LAB RESULTS:  Lab Results  Component Value Date   NA 141 06/12/2019   K 3.3 (L) 06/12/2019   CL 112 (H) 06/12/2019   CO2 21 (L) 06/12/2019   GLUCOSE 120 (H) 06/12/2019   BUN 15 06/12/2019   CREATININE 1.14 (H) 06/12/2019   CALCIUM 9.5 06/12/2019   PROT 7.1 06/12/2019   ALBUMIN 3.8 06/12/2019   AST 22 06/12/2019   ALT 17 06/12/2019   ALKPHOS 82 06/12/2019   BILITOT 0.4 06/12/2019   GFRNONAA 49 (L) 06/12/2019   GFRAA 57 (L) 06/12/2019    Lab Results  Component Value Date   WBC 3.9 (L) 06/12/2019   NEUTROABS 2.2 06/12/2019   HGB 10.4 (L) 06/12/2019   HCT 31.5 (L) 06/12/2019   MCV 89.0 06/12/2019   PLT 110 (L) 06/12/2019     STUDIES: Ct Head Wo Contrast  Result Date: 06/07/2019 CLINICAL DATA:  68 year old female with history of colon cancer presenting with headache and vomiting. EXAM: CT HEAD WITHOUT CONTRAST TECHNIQUE: Contiguous axial images were obtained from the base of the skull through the vertex without intravenous contrast. COMPARISON:  Head CT dated 09/03/2015. FINDINGS: Brain: Mild age-related atrophy and chronic microvascular ischemic changes. There is no acute intracranial hemorrhage. No mass effect or midline shift. No extra-axial fluid collection. Stable 1 cm right middle cranial fossa extra-axial calcification. Vascular: No hyperdense vessel or  unexpected calcification. Skull: Normal. Negative for fracture or focal lesion. Sinuses/Orbits: No acute finding. Other: None IMPRESSION: 1. No acute intracranial hemorrhage. 2. Age-related atrophy and chronic microvascular ischemic changes. Electronically Signed   By: Anner Crete M.D.   On: 06/07/2019 12:08    ASSESSMENT: Stage IIa ER+ left breast cancer with no breast lesion and axillary lymph node metastasis. (TxN1M0)  HER-2 negative.  Now with stage IIIa colon cancer.  PLAN:   1.  Stage IIIa colon cancer: Pathology report reviewed independently.  CT scan of her abdomen on January 02, 2019 that did not reveal metastatic disease.  CT scan of the chest on February 07, 2019 also was negative for metastatic disease.  Patient agreed to enroll in clinical trial.  Proceed with cycle 9 of 12 of FOLFOX plus Tecentriq today.  Patient also received an additional liter of IV fluids.  Return to clinic in 2 days for pump removal and IV fluids and then in 2 weeks for further evaluation and consideration of cycle 10.  2. Stage IIa ER+ left breast cancer with no breast lesion and axillary lymph node metastasis: Despite no obvious breast lesion on mammogram or breast MRI, pathology and pattern of spread was consistent with primary breast cancer. CT and bone scan at time of diagnosis revealed no other evidence of malignancy.  She only received 3 cycles of Adriamycin and Cytoxan. Taxol was discontinued early as well secondary to persistent peripheral neuropathy. Patient completed her chemotherapy on Nov 19, 2014.  She elected not to pursue axillary node dissection given the potential morbidity of this procedure. It was also elected not to pursue adjuvant XRT given there  was no primary breast lesion.  Patient completed approximately 4 years of treatment with letrozole, but this has been discontinued secondary to enrolling in clinical trial for her newly diagnosed colon cancer.  Her most recent mammogram on June 29, 2018 was  reported as BI-RADS 2.  Repeat in December 2020.  Continue to monitor throughout treatment for colon cancer. 3.  Bone health: Patient's most recent bone mineral density on February 03, 2018 reported T score of -0.7 which is unchanged from 3 years prior.  Consider repeat bone mineral density in July 2021.  4.  Lynch syndrome: Genetic testing confirmed patient has Lynch syndrome.  She reports her sister and son both have tested positive.  She plans to talk to the rest of her children about genetic testing.  Appreciate genetics input. 5.  Endometrial cancer: Patient has had a total hysterectomy. 6.  Pain/Peripheral neuropathy: Slightly worse.  Agree with current medications from pain clinic.  Continue follow-up with them as indicated.   7. Anemia: Hemoglobin slightly improved to 10.4 today. 8.  Thrombocytopenia: Platelets have trended down slightly, proceed with treatment as above. 9.  Renal insufficiency: Chronic and unchanged.  Patient will receive an additional liter of IV fluids today and then again in 2 days with her pump removal. 10.  Hypokalemia: Continue oral potassium supplementation 20 mEq twice per day. 11.  Leukopenia: Mild. 12.  Diarrhea: Chronic and unchanged.  Continue Imodium as needed. 13.  Fatigue: Improved.  Patient has no new medications, she is anemic but this is relatively unchanged.  Thyroid studies are within normal limits.  She states her sleep habits are good.  Continue to monitor and consider a trial of dexamethasone or Remeron.   Patient expressed understanding and was in agreement with this plan. She also understands that She can call clinic at any time with any questions, concerns, or complaints.   Breast cancer metastasized to axillary lymph node   Staging form: Breast, AJCC 7th Edition     Clinical stage from 10/22/2014: Stage Unknown (TX, N1, M0) - Signed by [name], MD on 10/22/2014   [name], MD   06/12/2019 10:33 AM

## 2019-06-07 NOTE — ED Notes (Signed)
Provided pt blankets per her request.

## 2019-06-07 NOTE — ED Triage Notes (Signed)
Pt here with c/o headache and vomiting for the past few days, feels she is dehydrated, is being treated for stage III colon cancer. NAD.

## 2019-06-07 NOTE — ED Notes (Signed)
Pt taken to CT.

## 2019-06-07 NOTE — Progress Notes (Addendum)
[facility]  [phone] [phone]  ID: Carrie Alvarez OB: Jun 13, 1951  MR#: [id number]  JOA#:[id number]  Patient Care Team: Paulene Floor as PCP - General (Physician Assistant) [name], MD as Consulting Physician (Oncology) [name], MD as Attending Physician (Pain Medicine) [name]., MD as Consulting Physician (Ophthalmology) [name], RN as Oncology Nurse Navigator  I connected with Carrie Alvarez on 06/07/19 at  1:30 PM EST by video enabled telemedicine visit and verified that I am speaking with the correct person using two identifiers.   I discussed the limitations, risks, security and privacy concerns of performing an evaluation and management service by telemedicine and the availability of in-person appointments. I also discussed with the patient that there may be a patient responsible charge related to this service. The patient expressed understanding and agreed to proceed.   Other persons participating in the visit and their role in the encounter: Patient, MD  Patient's location: Home Provider's location: Clinic  CHIEF COMPLAINT: Stage IIa ER+ left breast cancer with no breast lesion and axillary lymph node metastasis.  Now with stage IIIa colon cancer.  INTERVAL HISTORY: Patient agreed to video enabled telemedicine visit for further evaluation and discussion of her excessive fatigue.  Patient states after her last treatment of FOLFOX plus Tecentriq, she slept "for 3 straight days".  She also has excessive fatigue making it difficult to do many activities.  She continues to have chronic diarrhea which is controlled with Imodium.  Her peripheral neuropathy is unchanged. She has no other neurologic complaints.  She has a good appetite and denies weight loss.  She denies any recent fevers or illnesses.  She has no chest pain, shortness of breath, cough, or hemoptysis.  She denies any  nausea, vomiting, or constipation.  She has no melena or hematochezia.  She has no urinary complaints.  Patient offers no further specific complaints today.  REVIEW OF SYSTEMS:   Review of Systems  Constitutional: Positive for malaise/fatigue. Negative for fever and weight loss.  Respiratory: Negative.  Negative for cough and shortness of breath.   Cardiovascular: Negative.  Negative for chest pain and leg swelling.  Gastrointestinal: Positive for diarrhea. Negative for abdominal pain, blood in stool, constipation, melena, nausea and vomiting.  Genitourinary: Negative.  Negative for dysuria.  Musculoskeletal: Negative.  Negative for back pain.  Skin: Negative.  Negative for rash.  Neurological: Positive for sensory change and weakness. Negative for dizziness, focal weakness and headaches.  Psychiatric/Behavioral: Negative.  Negative for depression. The patient is not nervous/anxious.     As per HPI. Otherwise, a complete review of systems is negative.  PAST MEDICAL HISTORY: Past Medical History:  Diagnosis Date  . Allergy   . Anemia   . Back pain   . Breast cancer ([place]) 2015   LT LUMPECTOMY 12-2014 FOLLOWING CHEMO  . Carpal tunnel syndrome    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  . Cataract   . Colon cancer ([place]) 2020  . Depression   . Family history of breast cancer   . Family history of colon cancer   . GERD (gastroesophageal reflux disease)    problem with reflux during chemo  . History of methicillin resistant staphylococcus aureus (MRSA)   . Hypercholesteremia   . Hypertension   . Lumbosacral pain    sees pain management in [place]  . Obesity   . Personal history of chemotherapy 2016  . Pinched nerve    left  side, lumbar area  . Uterine cancer ([facility]) 1994    PAST SURGICAL HISTORY: Past Surgical History:  Procedure Laterality Date  . ABDOMINAL HYSTERECTOMY  1994   UTERINE CA  . AXILLARY LYMPH NODE BIOPSY Left 12/18/2014   Procedure: AXILLARY LYMPH NODE BIOPSY/;   Surgeon: Robert Bellow, MD;  Location: [facility];  Service: General;  Laterality: Left;  . AXILLARY LYMPH NODE DISSECTION Left 12/18/2014   Procedure: AXILLARY LYMPH NODE DISSECTION;  Surgeon: Robert Bellow, MD;  Location: [facility];  Service: General;  Laterality: Left;  . BREAST BIOPSY Left 05/2014   CORE BX OF LN, METASTATIC ADENOCARCINOMA  . BREAST LUMPECTOMY Left 12/2014   CHEMO FIRST THEN SURGERY OF LN  . BREAST SURGERY     lymph node removal  . CHOLECYSTECTOMY N/A 04/19/2016   Procedure: LAPAROSCOPIC CHOLECYSTECTOMY WITH INTRAOPERATIVE CHOLANGIOGRAM;  Surgeon: Hubbard Robinson, MD;  Location: [facility];  Service: General;  Laterality: N/A;  . COLON RESECTION Right 01/10/2019   Procedure: HAND ASSISTED LAPAROSCOPIC RIGHT COLON RESECTION;  Surgeon: Jules Husbands, MD;  Location: [facility];  Service: General;  Laterality: Right;  . COLONOSCOPY WITH PROPOFOL N/A 12/30/2018   Procedure: COLONOSCOPY WITH PROPOFOL;  Surgeon: Lucilla Lame, MD;  Location: [facility];  Service: Endoscopy;  Laterality: N/A;  . medial branch block  11/06/2015   lumbar facet Dr. Primus Bravo  . POLYPECTOMY N/A 12/30/2018   Procedure: POLYPECTOMY;  Surgeon: Lucilla Lame, MD;  Location: [facility];  Service: Endoscopy;  Laterality: N/A;  Clips placed at Hepatic Flexure Polyp (2) and Transverse Colon Polyp (4) removal sites  . PORTACATH PLACEMENT Right 02/02/2019   Procedure: INSERTION PORT-A-CATH;  Surgeon: Jules Husbands, MD;  Location: [facility];  Service: General;  Laterality: Right;  . SENTINEL NODE BIOPSY Left 12/18/2014   Procedure: SENTINEL NODE BIOPSY;  Surgeon: Robert Bellow, MD;  Location: [facility];  Service: General;  Laterality: Left;    FAMILY HISTORY Family History  Problem Relation Age of Onset  . Breast cancer Sister 84  . Colon cancer Sister   . Colon cancer Mother        dx 73s  . Hypertension Mother   . Asthma Mother   . Cancer Father        voice box removed  . Cancer  Maternal Grandmother        unsure type  . Colon cancer Cousin   . Cancer Cousin        unsure type       ADVANCED DIRECTIVES:    HEALTH MAINTENANCE: Social History   Tobacco Use  . Smoking status: Former Smoker    Packs/day: 0.50    Types: Cigarettes    Quit date: 07/18/2014    Years since quitting: 4.8  . Smokeless tobacco: Never Used  Substance Use Topics  . Alcohol use: No    Alcohol/week: 0.0 standard drinks  . Drug use: No    No Known Allergies  Current Outpatient Medications  Medication Sig Dispense Refill  . acetaminophen (TYLENOL) 500 MG tablet Take 500 mg by mouth every 6 (six) hours as needed for mild pain or moderate pain.     Marland Kitchen albuterol (PROVENTIL HFA;VENTOLIN HFA) 108 (90 Base) MCG/ACT inhaler Inhale 2 puffs into the lungs every 6 (six) hours as needed for wheezing or shortness of breath. 1 Inhaler 2  . amLODipine (NORVASC) 10 MG tablet Take 1 tablet (10 mg total) by mouth daily. (Patient taking differently: Take 10 mg  by mouth every morning. ) 90 tablet 0  . atorvastatin (LIPITOR) 10 MG tablet Take 1 tablet (10 mg total) by mouth every morning. 90 tablet 0  . baclofen (LIORESAL) 10 MG tablet Take 1 tablet (10 mg total) by mouth 2 (two) times daily. (Patient taking differently: Take 10 mg by mouth 3 (three) times daily as needed for muscle spasms. ) 30 each 0  . cholecalciferol (VITAMIN D) 1000 units tablet Take 1,000 Units by mouth daily.    . diphenoxylate-atropine (LOMOTIL) 2.5-0.025 MG tablet Take 1 tablet by mouth 4 (four) times daily as needed for diarrhea or loose stools. 30 tablet 0  . fluticasone (FLONASE) 50 MCG/ACT nasal spray Place 2 sprays into both nostrils daily. 16 g 0  . gabapentin (NEURONTIN) 300 MG capsule Take 300-600 mg by mouth 4 (four) times daily as needed (neuropathic pain).     Marland Kitchen lidocaine-prilocaine (EMLA) cream Apply to affected area once 30 g 3  . ondansetron (ZOFRAN ODT) 4 MG disintegrating tablet Take 1 tablet (4 mg total) by mouth  every 8 (eight) hours as needed for nausea or vomiting. (Patient not taking: Reported on 05/12/2019) 20 tablet 0  . Oxycodone HCl 10 MG TABS LIMIT ONE HALF TO ONE TABLET BY MOUTH 3 TO 5 TIMES DAILY IF TOLERATED    . potassium chloride SA (KLOR-CON) 20 MEQ tablet Take 1 tablet (20 mEq total) by mouth 2 (two) times daily. 60 tablet 1  . predniSONE (DELTASONE) 50 MG tablet Take 1 tab daily for a total of 7 days. 7 tablet 0  . pyridoxine (B-6) 100 MG tablet Take 100 mg by mouth daily.    . sertraline (ZOLOFT) 100 MG tablet Take 2 tablets by mouth once daily 180 tablet 0  . vitamin B-12 (CYANOCOBALAMIN) 500 MCG tablet Take 500 mcg by mouth daily.     No current facility-administered medications for this visit.    Facility-Administered Medications Ordered in Other Visits  Medication Dose Route Frequency Provider Last Rate Last Dose  . fluorouracil ALLIANCE [id number] (ADRUCIL) 4,700 mg in sodium chloride 0.9 % 56 mL chemo infusion  2,400 mg/m2 (Treatment Plan Recorded) Intravenous 1 day or 1 dose [name], MD   4,700 mg at 02/27/19 1448  . heparin lock flush 100 unit/mL  500 Units Intravenous Once [name], MD      . heparin lock flush 100 unit/mL  500 Units Intravenous Once [name], MD      . sodium chloride flush (NS) 0.9 % injection 10 mL  10 mL Intravenous PRN [name], MD   10 mL at 02/27/19 5427    OBJECTIVE: There were no vitals filed for this visit.   There is no height or weight on file to calculate BMI.    ECOG FS:1 - Symptomatic but completely ambulatory  General: Well-developed, well-nourished, no acute distress. HEENT: Normocephalic. Neuro: Alert, answering all questions appropriately. Cranial nerves grossly intact. Psych: Normal affect.  LAB RESULTS:  Lab Results  Component Value Date   NA 140 05/29/2019   K 2.9 (L) 05/29/2019   CL 108 05/29/2019   CO2 25 05/29/2019   GLUCOSE 112 (H) 05/29/2019   BUN 9 05/29/2019   CREATININE 1.14  (H) 05/29/2019   CALCIUM 9.4 05/29/2019   PROT 6.9 05/29/2019   ALBUMIN 3.7 05/29/2019   AST 24 05/29/2019   ALT 15 05/29/2019   ALKPHOS 68 05/29/2019   BILITOT 0.5 05/29/2019   GFRNONAA 49 (L) 05/29/2019  GFRAA 57 (L) 05/29/2019    Lab Results  Component Value Date   WBC 4.2 05/29/2019   NEUTROABS 2.5 05/29/2019   HGB 10.1 (L) 05/29/2019   HCT 29.8 (L) 05/29/2019   MCV 84.4 05/29/2019   PLT 116 (L) 05/29/2019     STUDIES: No results found.  ASSESSMENT: Stage IIa ER+ left breast cancer with no breast lesion and axillary lymph node metastasis. (TxN1M0)  HER-2 negative.  Now with stage IIIa colon cancer.  PLAN:   1.  Stage IIIa colon cancer: Pathology report reviewed independently.  CT scan of her abdomen on January 02, 2019 that did not reveal metastatic disease.  CT scan of the chest on February 07, 2019 also was negative for metastatic disease.  Patient agreed to enroll in clinical trial.  Patient received cycle 8 of 12 of FOLFOX plus Tecentriq approximately 1 week ago.  She has been instructed to keep her previously scheduled follow-up appointment for further evaluation and consideration of cycle 9. 2. Stage IIa ER+ left breast cancer with no breast lesion and axillary lymph node metastasis: Despite no obvious breast lesion on mammogram or breast MRI, pathology and pattern of spread was consistent with primary breast cancer. CT and bone scan at time of diagnosis revealed no other evidence of malignancy.  She only received 3 cycles of Adriamycin and Cytoxan. Taxol was discontinued early as well secondary to persistent peripheral neuropathy. Patient completed her chemotherapy on Nov 19, 2014.  She elected not to pursue axillary node dissection given the potential morbidity of this procedure. It was also elected not to pursue adjuvant XRT given there was no primary breast lesion.  Patient completed approximately 4 years of treatment with letrozole, but this has been discontinued secondary to  enrolling in clinical trial for her newly diagnosed colon cancer.  Her most recent mammogram on June 29, 2018 was reported as BI-RADS 2.  Repeat in December 2020.  Continue to monitor throughout treatment for colon cancer. 3.  Bone health: Patient's most recent bone mineral density on February 03, 2018 reported T score of -0.7 which is unchanged from 3 years prior.  Consider repeat bone mineral density in July 2021.  4.  Lynch syndrome: Genetic testing confirmed patient has Lynch syndrome.  She reports her sister and son both have tested positive.  She plans to talk to the rest of her children about genetic testing. 5.  Endometrial cancer: Patient has had a total hysterectomy. 6.  Pain/Peripheral neuropathy: Chronic and unchanged.  Continue follow-up in pain clinic as scheduled. 7. Anemia: Chronic and unchanged.  Patient's hemoglobin is 10.1. 8.  Thrombocytopenia: Chronic and unchanged. 9.  Renal insufficiency: Improved.  Encouraged fluid intake. 10.  Hypokalemia: Continue oral potassium supplementation 20 mEq twice per day. 11.  Leukopenia: Resolved. 12.  Diarrhea: Chronic and unchanged.  Continue Imodium as needed. 13.  Fatigue: Possibly related to treatment.  Patient has no new medications, she is anemic but this is relatively unchanged.  Thyroid studies are within normal limits.  She states her sleep habits are good.  Continue to monitor and consider a trial of dexamethasone or Remeron.  I provided 25 minutes of face-to-face video visit time during this encounter, and > 50% was spent counseling as documented under my assessment & plan.    Patient expressed understanding and was in agreement with this plan. She also understands that She can call clinic at any time with any questions, concerns, or complaints.   Breast cancer metastasized to axillary lymph  node   Staging form: Breast, AJCC 7th Edition     Clinical stage from 10/22/2014: Stage Unknown (TX, N1, M0) - Signed by [name],  MD on 10/22/2014   [name], MD   06/07/2019 6:30 AM

## 2019-06-09 ENCOUNTER — Other Ambulatory Visit: Payer: Self-pay | Admitting: Physician Assistant

## 2019-06-09 DIAGNOSIS — J309 Allergic rhinitis, unspecified: Secondary | ICD-10-CM

## 2019-06-12 ENCOUNTER — Inpatient Hospital Stay (HOSPITAL_BASED_OUTPATIENT_CLINIC_OR_DEPARTMENT_OTHER): Payer: Medicare Other | Admitting: Oncology

## 2019-06-12 ENCOUNTER — Inpatient Hospital Stay: Payer: Medicare Other

## 2019-06-12 ENCOUNTER — Encounter: Payer: Self-pay | Admitting: Oncology

## 2019-06-12 ENCOUNTER — Other Ambulatory Visit: Payer: Self-pay

## 2019-06-12 ENCOUNTER — Encounter: Payer: Self-pay | Admitting: *Deleted

## 2019-06-12 VITALS — BP 88/69 | HR 98 | Temp 95.8°F | Resp 20 | Wt 192.6 lb

## 2019-06-12 VITALS — BP 123/84 | HR 80 | Temp 95.3°F | Resp 16

## 2019-06-12 DIAGNOSIS — C184 Malignant neoplasm of transverse colon: Secondary | ICD-10-CM | POA: Diagnosis not present

## 2019-06-12 DIAGNOSIS — Z006 Encounter for examination for normal comparison and control in clinical research program: Secondary | ICD-10-CM | POA: Diagnosis not present

## 2019-06-12 DIAGNOSIS — Z5111 Encounter for antineoplastic chemotherapy: Secondary | ICD-10-CM | POA: Diagnosis not present

## 2019-06-12 LAB — COMPREHENSIVE METABOLIC PANEL
ALT: 17 U/L (ref 0–44)
AST: 22 U/L (ref 15–41)
Albumin: 3.8 g/dL (ref 3.5–5.0)
Alkaline Phosphatase: 82 U/L (ref 38–126)
Anion gap: 8 (ref 5–15)
BUN: 15 mg/dL (ref 8–23)
CO2: 21 mmol/L — ABNORMAL LOW (ref 22–32)
Calcium: 9.5 mg/dL (ref 8.9–10.3)
Chloride: 112 mmol/L — ABNORMAL HIGH (ref 98–111)
Creatinine, Ser: 1.14 mg/dL — ABNORMAL HIGH (ref 0.44–1.00)
GFR calc Af Amer: 57 mL/min — ABNORMAL LOW (ref >60)
GFR calc non Af Amer: 49 mL/min — ABNORMAL LOW (ref >60)
Glucose, Bld: 120 mg/dL — ABNORMAL HIGH (ref 70–99)
Potassium: 3.3 mmol/L — ABNORMAL LOW (ref 3.5–5.1)
Sodium: 141 mmol/L (ref 135–145)
Total Bilirubin: 0.4 mg/dL (ref 0.3–1.2)
Total Protein: 7.1 g/dL (ref 6.5–8.1)

## 2019-06-12 LAB — URINALYSIS, COMPLETE (UACMP) WITH MICROSCOPIC
Bilirubin Urine: NEGATIVE
Glucose, UA: NEGATIVE mg/dL
Hgb urine dipstick: NEGATIVE
Ketones, ur: NEGATIVE mg/dL
Leukocytes,Ua: NEGATIVE
Nitrite: NEGATIVE
Protein, ur: 100 mg/dL — AB
Specific Gravity, Urine: 1.032 — ABNORMAL HIGH (ref 1.005–1.030)
Squamous Epithelial / HPF: >50 — ABNORMAL HIGH (ref 0–5)
pH: 5 (ref 5.0–8.0)

## 2019-06-12 LAB — CBC WITH DIFFERENTIAL/PLATELET
Abs Immature Granulocytes: 0.02 10*3/uL (ref 0.00–0.07)
Basophils Absolute: 0 10*3/uL (ref 0.0–0.1)
Basophils Relative: 1 %
Eosinophils Absolute: 0.1 10*3/uL (ref 0.0–0.5)
Eosinophils Relative: 2 %
HCT: 31.5 % — ABNORMAL LOW (ref 36.0–46.0)
Hemoglobin: 10.4 g/dL — ABNORMAL LOW (ref 12.0–15.0)
Immature Granulocytes: 1 %
Lymphocytes Relative: 32 %
Lymphs Abs: 1.3 10*3/uL (ref 0.7–4.0)
MCH: 29.4 pg (ref 26.0–34.0)
MCHC: 33 g/dL (ref 30.0–36.0)
MCV: 89 fL (ref 80.0–100.0)
Monocytes Absolute: 0.3 10*3/uL (ref 0.1–1.0)
Monocytes Relative: 8 %
Neutro Abs: 2.2 10*3/uL (ref 1.7–7.7)
Neutrophils Relative %: 56 %
Platelets: 110 10*3/uL — ABNORMAL LOW (ref 150–400)
RBC: 3.54 MIL/uL — ABNORMAL LOW (ref 3.87–5.11)
RDW: 19.8 % — ABNORMAL HIGH (ref 11.5–15.5)
WBC: 3.9 10*3/uL — ABNORMAL LOW (ref 4.0–10.5)
nRBC: 0 % (ref 0.0–0.2)

## 2019-06-12 MED ORDER — FLUOROURACIL CHEMO INJECTION 2.5 GM/50ML ALLIANCE A021502
400.0000 mg/m2 | Freq: Once | INTRAVENOUS | Status: AC
  Administered 2019-06-12: 800 mg via INTRAVENOUS
  Filled 2019-06-12: qty 16

## 2019-06-12 MED ORDER — ACETAMINOPHEN 325 MG PO TABS
650.0000 mg | ORAL_TABLET | Freq: Once | ORAL | Status: AC
  Administered 2019-06-12: 650 mg via ORAL

## 2019-06-12 MED ORDER — SODIUM CHLORIDE 0.9 % IV SOLN
Freq: Once | INTRAVENOUS | Status: AC
  Administered 2019-06-12: 10:00:00 via INTRAVENOUS
  Filled 2019-06-12: qty 5

## 2019-06-12 MED ORDER — SODIUM CHLORIDE 0.9 % IV SOLN
2400.0000 mg/m2 | INTRAVENOUS | Status: DC
  Administered 2019-06-12: 4700 mg via INTRAVENOUS
  Filled 2019-06-12: qty 94

## 2019-06-12 MED ORDER — ACETAMINOPHEN 325 MG PO TABS
ORAL_TABLET | ORAL | Status: AC
  Filled 2019-06-12: qty 2

## 2019-06-12 MED ORDER — SODIUM CHLORIDE 0.9 % IV SOLN
840.0000 mg | Freq: Once | INTRAVENOUS | Status: AC
  Administered 2019-06-12: 840 mg via INTRAVENOUS
  Filled 2019-06-12: qty 14

## 2019-06-12 MED ORDER — PALONOSETRON HCL INJECTION 0.25 MG/5ML
0.2500 mg | Freq: Once | INTRAVENOUS | Status: AC
  Administered 2019-06-12: 0.25 mg via INTRAVENOUS
  Filled 2019-06-12: qty 5

## 2019-06-12 MED ORDER — SODIUM CHLORIDE 0.9 % IV SOLN
Freq: Once | INTRAVENOUS | Status: AC
  Administered 2019-06-12: 10:00:00 via INTRAVENOUS
  Filled 2019-06-12: qty 250

## 2019-06-12 MED ORDER — DEXTROSE 5 % IV SOLN
Freq: Once | INTRAVENOUS | Status: AC
  Administered 2019-06-12: 12:00:00 via INTRAVENOUS
  Filled 2019-06-12: qty 250

## 2019-06-12 MED ORDER — LEUCOVORIN 350 MG INJECTION FOR ALLIANCE A021502
400.0000 mg/m2 | Freq: Once | INTRAVENOUS | Status: AC
  Administered 2019-06-12: 780 mg via INTRAVENOUS
  Filled 2019-06-12: qty 25

## 2019-06-12 MED ORDER — OXALIPLATIN CHEMO INJECTION 100 MG/20ML FOR ALLIANCE A021502
64.6000 mg/m2 | Freq: Once | INTRAVENOUS | Status: AC
  Administered 2019-06-12: 125.95 mg via INTRAVENOUS
  Filled 2019-06-12: qty 20

## 2019-06-12 NOTE — Progress Notes (Signed)
Patient complaining of headache. Requesting Tylenol. Per Dr Grayland Ormond okay to give 650mg  Tylenol once

## 2019-06-12 NOTE — Research (Addendum)
Patient Carrie Alvarez returns to clinic again this morning for her C9D1 treatment on the [id number] [name] study. She was seen in the ED last week with symptoms of dehydration and with a low-grade fever and headache. She also reports being up the night before with nausea and vomiting associated with the headache. She did receive some Toradol while in the ED along with IV fluids for hydration and obtained relief for her headache. COVID-19 testing was performed while in the ED and was negative. Local labs were collected via port-a-cath per protocol this morning and CBC and chemistry values were reviewed by Dr. Grayland Alvarez. Patient still has low potassium this morning - 3.76mmol/L and states she has been unable to take her oral potassium supplement since she was sick last week. Other lab values remain within acceptable range for patient to receive her treatment this morning.  Temp stable and SpO2 remains at 100% on room air this morning with patient wearing a mask. HR slightly elevated at 98 and B/P is low at 88/69. Patient denies any abdominal pain at present, but states she does experience it occasionally with episodes of diarrhea. States the pain is intermittant and does not interfere with her ADLs, but her fatigue remains significant and she does report not being able to do much of anything due to her level of fatigue - partially from her current chemotherapy and partially from baseline. She had an episode of somnolence last week for about 3 day and states she was sleeping about 18 hours per day - to the point that Dr. Josefa Alvarez was very concerned about her and sent her to see Dr. Grayland Alvarez (06/06/2019). Reports continuing to have 3-4 BMs daily - some formed, some very soft and some are watery - still largely occurring after she eats. Solicited adverse events were assessed by this RN again today and Carrie Alvarez completed the C9 PRO-CTCAE booklet while in clinic, prior to her infusion. Patient remains in a wheelchair  only while in clinic due to her ongoing issues with fatigue, but states she is completely ambulatory and uses a cane while at home.She is much more alert this week than she was last week. Reports her neuropathy in fingertips and toes remains mild and consists of some tingling and numbness, but she does still report cold sensitivity for about 10 days following the Oxaliplatin infusion. Patient remains on level-1 dose reduced Oxaliplatin. Tenderness in the palms of her hands and the bottoms of her feet continues, and her skin remains darker in these areas. Patient's weight is still in consistent range of her baseline weight. Serum creatinine remains slightly elevated today at at 1.14mg /dL, and CrCl is 52mL/min.. Platelet count has decreased a little more to 110,000, still a grade 1. Patient's anemia remains at grade 1 with Hgb at 10.4g/dL following her chemotherapy 2 weeks ago. H&P completed by Dr. Grayland Alvarez and patient will proceed with Cycle 9 mFOLFOX6 + atezolizumab today. He has also ordered some additional hydration fluids today and again Wednesday if patient required them. Solicited and other Adverse events with grade and attribution as noted below.   Adverse Event Log  Study/Protocol: [id number] [name] Cycle: Cycle 8 Event Grade Onset Date Resolved Date Drug Name Attribution Treatment Comments  Diarrhea 1 05/18/2019 01/10/2019  05/02/2019 n/a surgery Imodium Lomotil Increased to 3-4 stools/day now - some are watery  Abdominal pain 2 04/08/2019 01/10/2019 04/12/2019 04/08/2019 n/a surgery Oxycodone Reports pain resolved  Constipation 0      Denies  Nausea 1 06/07/2019  Fatigue 2 06/12/2018 approx   ?  Reports no energy in past 6-9 months  Anorexia 1 07/13/2008   ?  States this is her norm for several years  Cough 0        Dyspnea 0        Fever 1 06/07/2019 06/07/2019    Temp 99.6 in ED  UTI 0        Palmer-plantar erythrodesia 1 03/13/2019    otc cream Bilateral palms and anterior fingers  discolored and tender  Total biliruben 0        Neutrophils 0        TSH 0      No history of hypothyroidism  Sensory Peripheral Neuropathy 2 11/19/2014   Paclitaxel gabapentin Tingling in fingertips and feet bil. since taking Taxol  Hypokalemia 1 03/27/2019 02/27/2019 04/17/2019 03/13/2019  probable none K+ up to 3.5mg /dL today  Anemia 1 05/01/2019     Hgb 10.2  Anemia 2 02/27/2019 04/17/2019  probable none Hgb 12.4  Elevated creatinine 1 02/07/2019   unrelated  Elevated at baseline CrCl: 67mL/min  Platelet count   decreased 1 05/29/2019 03/27/2019  04/17/2019  definitely none Plts 116,000 today -   Possible Colitis 2 04/08/2019 04/12/2019  probable Steroids Resolved  Weight Loss 1 04/10/2019 04/17/2019  probable none Weight back to normal    Proteinuria 1 05/15/2019    none Urine dipstick only  Abdominal pain 1 05/22/2019   unlikely  Occasional incisional pain again  Hypokalemia 3 05/29/2019   probable IV + oral K+ supplement K+ is 2.9 mmol/L today  vomiting 1 06/06/2019 06/07/2019  unlikely  Resolved with IVFs  somnolence 2 06/05/2019 06/08/2019  unlikely    headache 2 06/05/2019 06/07/2019  unlikely  Had Toradol in ED  [name], BSN, MHA, OCN 06/12/2019 10:02 AM

## 2019-06-12 NOTE — Progress Notes (Signed)
Patient her for a follow up has been in the hospital recently for dehydration, patient states doing better is having some left leg pain.

## 2019-06-14 ENCOUNTER — Other Ambulatory Visit: Payer: Self-pay

## 2019-06-14 ENCOUNTER — Inpatient Hospital Stay: Payer: Medicare Other | Attending: Oncology

## 2019-06-14 VITALS — BP 120/88 | HR 72 | Temp 96.2°F | Resp 18

## 2019-06-14 DIAGNOSIS — Z87891 Personal history of nicotine dependence: Secondary | ICD-10-CM | POA: Diagnosis not present

## 2019-06-14 DIAGNOSIS — D72819 Decreased white blood cell count, unspecified: Secondary | ICD-10-CM | POA: Diagnosis not present

## 2019-06-14 DIAGNOSIS — Z5112 Encounter for antineoplastic immunotherapy: Secondary | ICD-10-CM | POA: Insufficient documentation

## 2019-06-14 DIAGNOSIS — N189 Chronic kidney disease, unspecified: Secondary | ICD-10-CM | POA: Diagnosis not present

## 2019-06-14 DIAGNOSIS — C189 Malignant neoplasm of colon, unspecified: Secondary | ICD-10-CM | POA: Diagnosis present

## 2019-06-14 DIAGNOSIS — C184 Malignant neoplasm of transverse colon: Secondary | ICD-10-CM

## 2019-06-14 DIAGNOSIS — Z90710 Acquired absence of both cervix and uterus: Secondary | ICD-10-CM | POA: Insufficient documentation

## 2019-06-14 DIAGNOSIS — G629 Polyneuropathy, unspecified: Secondary | ICD-10-CM | POA: Insufficient documentation

## 2019-06-14 DIAGNOSIS — Z006 Encounter for examination for normal comparison and control in clinical research program: Secondary | ICD-10-CM | POA: Diagnosis present

## 2019-06-14 DIAGNOSIS — Z1509 Genetic susceptibility to other malignant neoplasm: Secondary | ICD-10-CM | POA: Insufficient documentation

## 2019-06-14 DIAGNOSIS — R197 Diarrhea, unspecified: Secondary | ICD-10-CM | POA: Diagnosis not present

## 2019-06-14 DIAGNOSIS — D649 Anemia, unspecified: Secondary | ICD-10-CM | POA: Insufficient documentation

## 2019-06-14 DIAGNOSIS — Z8542 Personal history of malignant neoplasm of other parts of uterus: Secondary | ICD-10-CM | POA: Insufficient documentation

## 2019-06-14 DIAGNOSIS — Z853 Personal history of malignant neoplasm of breast: Secondary | ICD-10-CM | POA: Diagnosis not present

## 2019-06-14 MED ORDER — SODIUM CHLORIDE 0.9 % IV SOLN
Freq: Once | INTRAVENOUS | Status: AC
  Administered 2019-06-14: 1000 mL via INTRAVENOUS
  Filled 2019-06-14: qty 250

## 2019-06-14 MED ORDER — HEPARIN SOD (PORK) LOCK FLUSH 100 UNIT/ML IV SOLN
500.0000 [IU] | Freq: Once | INTRAVENOUS | Status: AC | PRN
  Administered 2019-06-14: 500 [IU]
  Filled 2019-06-14: qty 5

## 2019-06-14 MED ORDER — SODIUM CHLORIDE 0.9% FLUSH
10.0000 mL | INTRAVENOUS | Status: DC | PRN
  Administered 2019-06-14: 10 mL
  Filled 2019-06-14: qty 10

## 2019-06-20 ENCOUNTER — Other Ambulatory Visit: Payer: Self-pay | Admitting: Physician Assistant

## 2019-06-20 DIAGNOSIS — I10 Essential (primary) hypertension: Secondary | ICD-10-CM

## 2019-06-20 DIAGNOSIS — E7800 Pure hypercholesterolemia, unspecified: Secondary | ICD-10-CM

## 2019-06-21 ENCOUNTER — Other Ambulatory Visit: Payer: Self-pay | Admitting: Oncology

## 2019-06-22 ENCOUNTER — Other Ambulatory Visit: Payer: Self-pay | Admitting: Oncology

## 2019-06-23 ENCOUNTER — Other Ambulatory Visit: Payer: Self-pay

## 2019-06-23 NOTE — Progress Notes (Signed)
Patient pre screened for office appointment, no questions or concerns today. Patient reminded of upcoming appointment time and date. 

## 2019-06-25 NOTE — Progress Notes (Addendum)
[place]  [phone] [phone]  ID: Norm Salt OB: Apr 12, 1951  MR#: [id number]  QMV#:[id number]  Patient Care Team: Paulene Floor as PCP - General (Physician Assistant) [name], MD as Consulting Physician (Oncology) [name], MD as Attending Physician (Pain Medicine) [name]., MD as Consulting Physician (Ophthalmology) [name], RN as Oncology Nurse Navigator  CHIEF COMPLAINT: Stage IIa ER+ left breast cancer with no breast lesion and axillary lymph node metastasis.  Now with stage IIIa colon cancer.  INTERVAL HISTORY: Patient returns to clinic today for further evaluation and consideration of cycle 10 of FOLFOX plus Tecentriq on clinical trial.  She states her peripheral neuropathy has become significantly worse over the past 2 weeks to the point where she cannot tell whether she is wearing shoes or not and has difficulty picking up things with her fingertips.  She has a persistent cold neuropathy as well.  Her chronic diarrhea is unchanged.  She has no other neurologic complaints.  She has a fair appetite, but denies weight loss.  She denies any recent fevers or illnesses.  She has no chest pain, shortness of breath, cough, or hemoptysis.  She denies any nausea, vomiting, or constipation.  She has no melena or hematochezia.  She has no urinary complaints.  Patient offers no further specific complaints today.  REVIEW OF SYSTEMS:   Review of Systems  Constitutional: Positive for malaise/fatigue. Negative for fever and weight loss.  Respiratory: Negative.  Negative for cough and shortness of breath.   Cardiovascular: Negative.  Negative for chest pain and leg swelling.  Gastrointestinal: Positive for diarrhea. Negative for abdominal pain, blood in stool, constipation, melena, nausea and vomiting.  Genitourinary: Negative.  Negative for dysuria.  Musculoskeletal: Negative.  Negative for back  pain.  Skin: Negative.  Negative for rash.  Neurological: Positive for tingling, sensory change and weakness. Negative for dizziness, focal weakness and headaches.  Psychiatric/Behavioral: Negative.  Negative for depression. The patient is not nervous/anxious.     As per HPI. Otherwise, a complete review of systems is negative.  PAST MEDICAL HISTORY: Past Medical History:  Diagnosis Date  . Allergy   . Anemia   . Back pain   . Breast cancer ([place]) 2015   LT LUMPECTOMY 12-2014 FOLLOWING CHEMO  . Carpal tunnel syndrome    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  . Cataract   . Colon cancer ([place]) 2020  . Depression   . Family history of breast cancer   . Family history of colon cancer   . GERD (gastroesophageal reflux disease)    problem with reflux during chemo  . History of methicillin resistant staphylococcus aureus (MRSA)   . Hypercholesteremia   . Hypertension   . Lumbosacral pain    sees pain management in [place]  . Obesity   . Personal history of chemotherapy 2016  . Pinched nerve    left side, lumbar area  . Uterine cancer ([place]) 1994    PAST SURGICAL HISTORY: Past Surgical History:  Procedure Laterality Date  . ABDOMINAL HYSTERECTOMY  1994   UTERINE CA  . AXILLARY LYMPH NODE BIOPSY Left 12/18/2014   Procedure: AXILLARY LYMPH NODE BIOPSY/;  Surgeon: Robert Bellow, MD;  Location: [facility];  Service: General;  Laterality: Left;  . AXILLARY LYMPH NODE DISSECTION Left 12/18/2014   Procedure: AXILLARY LYMPH NODE DISSECTION;  Surgeon: Robert Bellow, MD;  Location: [facility];  Service: General;  Laterality: Left;  .  BREAST BIOPSY Left 05/2014   CORE BX OF LN, METASTATIC ADENOCARCINOMA  . BREAST LUMPECTOMY Left 12/2014   CHEMO FIRST THEN SURGERY OF LN  . BREAST SURGERY     lymph node removal  . CHOLECYSTECTOMY N/A 04/19/2016   Procedure: LAPAROSCOPIC CHOLECYSTECTOMY WITH INTRAOPERATIVE CHOLANGIOGRAM;  Surgeon: Hubbard Robinson, MD;  Location: [facility];  Service:  General;  Laterality: N/A;  . COLON RESECTION Right 01/10/2019   Procedure: HAND ASSISTED LAPAROSCOPIC RIGHT COLON RESECTION;  Surgeon: Jules Husbands, MD;  Location: [facility];  Service: General;  Laterality: Right;  . COLONOSCOPY WITH PROPOFOL N/A 12/30/2018   Procedure: COLONOSCOPY WITH PROPOFOL;  Surgeon: Lucilla Lame, MD;  Location: [facility];  Service: Endoscopy;  Laterality: N/A;  . medial branch block  11/06/2015   lumbar facet Dr. Primus Bravo  . POLYPECTOMY N/A 12/30/2018   Procedure: POLYPECTOMY;  Surgeon: Lucilla Lame, MD;  Location: [facility];  Service: Endoscopy;  Laterality: N/A;  Clips placed at Hepatic Flexure Polyp (2) and Transverse Colon Polyp (4) removal sites  . PORTACATH PLACEMENT Right 02/02/2019   Procedure: INSERTION PORT-A-CATH;  Surgeon: Jules Husbands, MD;  Location: [facility];  Service: General;  Laterality: Right;  . SENTINEL NODE BIOPSY Left 12/18/2014   Procedure: SENTINEL NODE BIOPSY;  Surgeon: Robert Bellow, MD;  Location: [facility];  Service: General;  Laterality: Left;    FAMILY HISTORY Family History  Problem Relation Age of Onset  . Breast cancer Sister 54  . Colon cancer Sister   . Colon cancer Mother        dx 38s  . Hypertension Mother   . Asthma Mother   . Cancer Father        voice box removed  . Cancer Maternal Grandmother        unsure type  . Colon cancer Cousin   . Cancer Cousin        unsure type       ADVANCED DIRECTIVES:    HEALTH MAINTENANCE: Social History   Tobacco Use  . Smoking status: Former Smoker    Packs/day: 0.50    Types: Cigarettes    Quit date: 07/18/2014    Years since quitting: 4.9  . Smokeless tobacco: Never Used  Substance Use Topics  . Alcohol use: No    Alcohol/week: 0.0 standard drinks  . Drug use: No    No Known Allergies  Current Outpatient Medications  Medication Sig Dispense Refill  . acetaminophen (TYLENOL) 500 MG tablet Take 500 mg by mouth every 6 (six) hours as needed for mild  pain or moderate pain.     Marland Kitchen albuterol (PROVENTIL HFA;VENTOLIN HFA) 108 (90 Base) MCG/ACT inhaler Inhale 2 puffs into the lungs every 6 (six) hours as needed for wheezing or shortness of breath. 1 Inhaler 2  . amLODipine (NORVASC) 10 MG tablet Take 1 tablet by mouth once daily 90 tablet 0  . atorvastatin (LIPITOR) 10 MG tablet TAKE 1 TABLET BY MOUTH ONCE DAILY IN THE MORNING 90 tablet 0  . baclofen (LIORESAL) 10 MG tablet Take 1 tablet (10 mg total) by mouth 2 (two) times daily. (Patient taking differently: Take 10 mg by mouth 3 (three) times daily as needed for muscle spasms. ) 30 each 0  . cholecalciferol (VITAMIN D) 1000 units tablet Take 1,000 Units by mouth daily.    . diphenoxylate-atropine (LOMOTIL) 2.5-0.025 MG tablet Take 1 tablet by mouth 4 (four) times daily as needed for diarrhea or loose stools. 30 tablet  0  . fluticasone (FLONASE) 50 MCG/ACT nasal spray Use 2 spray(s) in each nostril once daily 16 g 0  . gabapentin (NEURONTIN) 300 MG capsule Take 300-600 mg by mouth 4 (four) times daily as needed (neuropathic pain).     Marland Kitchen lidocaine-prilocaine (EMLA) cream Apply to affected area once 30 g 3  . ondansetron (ZOFRAN ODT) 4 MG disintegrating tablet Take 1 tablet (4 mg total) by mouth every 8 (eight) hours as needed for nausea or vomiting. 20 tablet 0  . Oxycodone HCl 10 MG TABS LIMIT ONE HALF TO ONE TABLET BY MOUTH 3 TO 5 TIMES DAILY IF TOLERATED    . potassium chloride SA (KLOR-CON) 20 MEQ tablet Take 1 tablet (20 mEq total) by mouth 2 (two) times daily. 60 tablet 1  . predniSONE (DELTASONE) 50 MG tablet Take 1 tab daily for a total of 7 days. 7 tablet 0  . pyridoxine (B-6) 100 MG tablet Take 100 mg by mouth daily.    . sertraline (ZOLOFT) 100 MG tablet Take 2 tablets by mouth once daily 180 tablet 0  . vitamin B-12 (CYANOCOBALAMIN) 500 MCG tablet Take 500 mcg by mouth daily.     No current facility-administered medications for this visit.   Facility-Administered Medications Ordered in  Other Visits  Medication Dose Route Frequency Provider Last Rate Last Admin  . fluorouracil ALLIANCE [id number] (ADRUCIL) 4,700 mg in sodium chloride 0.9 % 56 mL chemo infusion  2,400 mg/m2 (Treatment Plan Recorded) Intravenous 1 day or 1 dose [name], MD   4,700 mg at 02/27/19 1448  . fluorouracil ALLIANCE [id number] (ADRUCIL) 4,700 mg in sodium chloride 0.9 % 56 mL chemo infusion  2,400 mg/m2 (Treatment Plan Recorded) Intravenous 1 day or 1 dose [name], MD      . fluorouracil ALLIANCE [phone] (ADRUCIL) chemo injection 800 mg  400 mg/m2 (Treatment Plan Recorded) Intravenous Once [name], MD      . heparin lock flush 100 unit/mL  500 Units Intravenous Once [name], MD      . heparin lock flush 100 unit/mL  500 Units Intravenous Once [name], MD      . leucovorin ALLIANCE [id number] 780 mg in dextrose 5 % 250 mL infusion  780 mg Intravenous Once [name], MD      . sodium chloride flush (NS) 0.9 % injection 10 mL  10 mL Intravenous PRN [name], MD   10 mL at 02/27/19 0925  . sodium chloride flush (NS) 0.9 % injection 10 mL  10 mL Intravenous PRN [name], MD   10 mL at 06/26/19 0834    OBJECTIVE: Vitals:   06/26/19 0842  BP: 92/72  Pulse: 77  Resp: 16  Temp: 97.7 F (36.5 C)  SpO2: 100%     Body mass index is 34.28 kg/m.    ECOG FS:1 - Symptomatic but completely ambulatory  General: Well-developed, well-nourished, no acute distress. Eyes: Pink conjunctiva, anicteric sclera. HEENT: Normocephalic, moist mucous membranes. Lungs: No audible wheezing or coughing. Heart: Regular rate and rhythm. Abdomen: Soft, nontender, no obvious distention. Musculoskeletal: No edema, cyanosis, or clubbing. Neuro: Alert, answering all questions appropriately. Cranial nerves grossly intact. Skin: No rashes or petechiae noted. Psych: Normal affect.   LAB RESULTS:  Lab Results  Component Value Date   NA 139  06/26/2019   K 4.0 06/26/2019   CL 107 06/26/2019   CO2 23 06/26/2019   GLUCOSE 88 06/26/2019   BUN  8 06/26/2019   CREATININE 1.12 (H) 06/26/2019   CALCIUM 9.3 06/26/2019   PROT 6.9 06/26/2019   ALBUMIN 3.6 06/26/2019   AST 22 06/26/2019   ALT 18 06/26/2019   ALKPHOS 75 06/26/2019   BILITOT 0.5 06/26/2019   GFRNONAA 50 (L) 06/26/2019   GFRAA 58 (L) 06/26/2019    Lab Results  Component Value Date   WBC 3.5 (L) 06/26/2019   NEUTROABS 1.7 06/26/2019   HGB 9.4 (L) 06/26/2019   HCT 28.9 (L) 06/26/2019   MCV 90.6 06/26/2019   PLT 103 (L) 06/26/2019     STUDIES: CT Head Wo Contrast  Result Date: 06/07/2019 CLINICAL DATA:  68 year old female with history of colon cancer presenting with headache and vomiting. EXAM: CT HEAD WITHOUT CONTRAST TECHNIQUE: Contiguous axial images were obtained from the base of the skull through the vertex without intravenous contrast. COMPARISON:  Head CT dated 09/03/2015. FINDINGS: Brain: Mild age-related atrophy and chronic microvascular ischemic changes. There is no acute intracranial hemorrhage. No mass effect or midline shift. No extra-axial fluid collection. Stable 1 cm right middle cranial fossa extra-axial calcification. Vascular: No hyperdense vessel or unexpected calcification. Skull: Normal. Negative for fracture or focal lesion. Sinuses/Orbits: No acute finding. Other: None IMPRESSION: 1. No acute intracranial hemorrhage. 2. Age-related atrophy and chronic microvascular ischemic changes. Electronically Signed   By: Anner Crete M.D.   On: 06/07/2019 12:08    ASSESSMENT: Stage IIa ER+ left breast cancer with no breast lesion and axillary lymph node metastasis. (TxN1M0)  HER-2 negative.  Now with stage IIIa colon cancer.  PLAN:   1.  Stage IIIa colon cancer: Pathology report reviewed independently.  CT scan of her abdomen on January 02, 2019 that did not reveal metastatic disease.  CT scan of the chest on February 07, 2019 also was negative for  metastatic disease.  Patient agreed to enroll in clinical trial.  Proceed with cycle 10 of 12 of treatment today, but will discontinue oxaliplatin secondary to worsening neuropathy.  Patient will receive 5-FU and Tecentriq today as well as 5-FU pump.  Return to clinic in 2 weeks for further evaluation and consideration of cycle 11 without oxaliplatin.  2. Stage IIa ER+ left breast cancer with no breast lesion and axillary lymph node metastasis: Despite no obvious breast lesion on mammogram or breast MRI, pathology and pattern of spread was consistent with primary breast cancer. CT and bone scan at time of diagnosis revealed no other evidence of malignancy.  She only received 3 cycles of Adriamycin and Cytoxan. Taxol was discontinued early as well secondary to persistent peripheral neuropathy. Patient completed her chemotherapy on Nov 19, 2014.  She elected not to pursue axillary node dissection given the potential morbidity of this procedure. It was also elected not to pursue adjuvant XRT given there was no primary breast lesion.  Patient completed approximately 4 years of treatment with letrozole, but this has been discontinued secondary to enrolling in clinical trial for her newly diagnosed colon cancer.  Her most recent mammogram on June 29, 2018 was reported as BI-RADS 2.  Repeat mammogram in the next 1 to 2 weeks. 3.  Bone health: Patient's most recent bone mineral density on February 03, 2018 reported T score of -0.7 which is unchanged from 3 years prior.  Consider repeat bone mineral density in July 2021.  4.  Lynch syndrome: Genetic testing confirmed patient has Lynch syndrome.  She reports her sister and son both have tested positive.  She plans to talk to  the rest of her children about genetic testing.  Appreciate genetics input. 5.  Endometrial cancer: Patient has had a total hysterectomy. 6.  Pain/Peripheral neuropathy: Grade 3. Significantly worse today.  Discontinue oxaliplatin as above.  Agree  with current medications from pain clinic.  Continue follow-up with them as indicated.   7. Anemia: Hemoglobin has trended down to 9.4.  Monitor. 8.  Thrombocytopenia: Platelets are 103 today.  Proceed with treatment as above. 9.  Renal insufficiency: Chronic and unchanged.  10.  Hypokalemia: Resolved.  Continue oral potassium supplementation 20 mEq twice per day. 11.  Leukopenia: Chronic and unchanged. 12.  Diarrhea: Chronic and unchanged.  Continue Imodium as needed. 13.  Fatigue: Improved.  Patient has no new medications, she is anemic but this is relatively unchanged.  Thyroid studies are within normal limits.  She states her sleep habits are good.  Continue to monitor and consider a trial of dexamethasone or Remeron.   Patient expressed understanding and was in agreement with this plan. She also understands that She can call clinic at any time with any questions, concerns, or complaints.   Breast cancer metastasized to axillary lymph node   Staging form: Breast, AJCC 7th Edition     Clinical stage from 10/22/2014: Stage Unknown (TX, N1, M0) - Signed by [name], MD on 10/22/2014   [name], MD   06/26/2019 11:14 AM

## 2019-06-26 ENCOUNTER — Encounter: Payer: Self-pay | Admitting: Oncology

## 2019-06-26 ENCOUNTER — Inpatient Hospital Stay: Payer: Medicare Other

## 2019-06-26 ENCOUNTER — Other Ambulatory Visit: Payer: Self-pay

## 2019-06-26 ENCOUNTER — Inpatient Hospital Stay (HOSPITAL_BASED_OUTPATIENT_CLINIC_OR_DEPARTMENT_OTHER): Payer: Medicare Other | Admitting: Oncology

## 2019-06-26 ENCOUNTER — Encounter: Payer: Self-pay | Admitting: *Deleted

## 2019-06-26 VITALS — BP 92/72 | HR 77 | Temp 97.7°F | Resp 16 | Wt 193.5 lb

## 2019-06-26 VITALS — BP 113/80 | HR 70 | Temp 96.7°F | Resp 18

## 2019-06-26 DIAGNOSIS — Z853 Personal history of malignant neoplasm of breast: Secondary | ICD-10-CM

## 2019-06-26 DIAGNOSIS — C184 Malignant neoplasm of transverse colon: Secondary | ICD-10-CM

## 2019-06-26 DIAGNOSIS — G629 Polyneuropathy, unspecified: Secondary | ICD-10-CM

## 2019-06-26 DIAGNOSIS — C773 Secondary and unspecified malignant neoplasm of axilla and upper limb lymph nodes: Secondary | ICD-10-CM

## 2019-06-26 DIAGNOSIS — D72819 Decreased white blood cell count, unspecified: Secondary | ICD-10-CM

## 2019-06-26 DIAGNOSIS — N189 Chronic kidney disease, unspecified: Secondary | ICD-10-CM

## 2019-06-26 DIAGNOSIS — Z8542 Personal history of malignant neoplasm of other parts of uterus: Secondary | ICD-10-CM

## 2019-06-26 DIAGNOSIS — Z87891 Personal history of nicotine dependence: Secondary | ICD-10-CM | POA: Diagnosis not present

## 2019-06-26 DIAGNOSIS — Z006 Encounter for examination for normal comparison and control in clinical research program: Secondary | ICD-10-CM | POA: Diagnosis not present

## 2019-06-26 DIAGNOSIS — C189 Malignant neoplasm of colon, unspecified: Secondary | ICD-10-CM

## 2019-06-26 DIAGNOSIS — D649 Anemia, unspecified: Secondary | ICD-10-CM

## 2019-06-26 DIAGNOSIS — Z90710 Acquired absence of both cervix and uterus: Secondary | ICD-10-CM

## 2019-06-26 DIAGNOSIS — Z1509 Genetic susceptibility to other malignant neoplasm: Secondary | ICD-10-CM

## 2019-06-26 DIAGNOSIS — C50919 Malignant neoplasm of unspecified site of unspecified female breast: Secondary | ICD-10-CM

## 2019-06-26 DIAGNOSIS — Z5112 Encounter for antineoplastic immunotherapy: Secondary | ICD-10-CM | POA: Diagnosis not present

## 2019-06-26 DIAGNOSIS — R197 Diarrhea, unspecified: Secondary | ICD-10-CM

## 2019-06-26 LAB — COMPREHENSIVE METABOLIC PANEL
ALT: 18 U/L (ref 0–44)
AST: 22 U/L (ref 15–41)
Albumin: 3.6 g/dL (ref 3.5–5.0)
Alkaline Phosphatase: 75 U/L (ref 38–126)
Anion gap: 9 (ref 5–15)
BUN: 8 mg/dL (ref 8–23)
CO2: 23 mmol/L (ref 22–32)
Calcium: 9.3 mg/dL (ref 8.9–10.3)
Chloride: 107 mmol/L (ref 98–111)
Creatinine, Ser: 1.12 mg/dL — ABNORMAL HIGH (ref 0.44–1.00)
GFR calc Af Amer: 58 mL/min — ABNORMAL LOW (ref >60)
GFR calc non Af Amer: 50 mL/min — ABNORMAL LOW (ref >60)
Glucose, Bld: 88 mg/dL (ref 70–99)
Potassium: 4 mmol/L (ref 3.5–5.1)
Sodium: 139 mmol/L (ref 135–145)
Total Bilirubin: 0.5 mg/dL (ref 0.3–1.2)
Total Protein: 6.9 g/dL (ref 6.5–8.1)

## 2019-06-26 LAB — CBC WITH DIFFERENTIAL/PLATELET
Abs Immature Granulocytes: 0.01 10*3/uL (ref 0.00–0.07)
Basophils Absolute: 0 10*3/uL (ref 0.0–0.1)
Basophils Relative: 1 %
Eosinophils Absolute: 0.1 10*3/uL (ref 0.0–0.5)
Eosinophils Relative: 2 %
HCT: 28.9 % — ABNORMAL LOW (ref 36.0–46.0)
Hemoglobin: 9.4 g/dL — ABNORMAL LOW (ref 12.0–15.0)
Immature Granulocytes: 0 %
Lymphocytes Relative: 37 %
Lymphs Abs: 1.3 10*3/uL (ref 0.7–4.0)
MCH: 29.5 pg (ref 26.0–34.0)
MCHC: 32.5 g/dL (ref 30.0–36.0)
MCV: 90.6 fL (ref 80.0–100.0)
Monocytes Absolute: 0.4 10*3/uL (ref 0.1–1.0)
Monocytes Relative: 10 %
Neutro Abs: 1.7 10*3/uL (ref 1.7–7.7)
Neutrophils Relative %: 50 %
Platelets: 103 10*3/uL — ABNORMAL LOW (ref 150–400)
RBC: 3.19 MIL/uL — ABNORMAL LOW (ref 3.87–5.11)
RDW: 19 % — ABNORMAL HIGH (ref 11.5–15.5)
WBC: 3.5 10*3/uL — ABNORMAL LOW (ref 4.0–10.5)
nRBC: 0 % (ref 0.0–0.2)

## 2019-06-26 MED ORDER — SODIUM CHLORIDE 0.9 % IV SOLN
Freq: Once | INTRAVENOUS | Status: AC
  Administered 2019-06-26: 10:00:00 via INTRAVENOUS
  Filled 2019-06-26: qty 5

## 2019-06-26 MED ORDER — LEUCOVORIN 350 MG INJECTION FOR ALLIANCE A021502
780.0000 mg | Freq: Once | INTRAVENOUS | Status: AC
  Administered 2019-06-26: 780 mg via INTRAVENOUS
  Filled 2019-06-26: qty 25

## 2019-06-26 MED ORDER — PALONOSETRON HCL INJECTION 0.25 MG/5ML
0.2500 mg | Freq: Once | INTRAVENOUS | Status: AC
  Administered 2019-06-26: 0.25 mg via INTRAVENOUS
  Filled 2019-06-26: qty 5

## 2019-06-26 MED ORDER — LEUCOVORIN 350 MG INJECTION FOR ALLIANCE A021502: 400.0000 mg/m2 | Freq: Once | INTRAVENOUS | Status: DC

## 2019-06-26 MED ORDER — SODIUM CHLORIDE 0.9 % IV SOLN
2400.0000 mg/m2 | INTRAVENOUS | Status: DC
  Administered 2019-06-26: 4700 mg via INTRAVENOUS
  Filled 2019-06-26: qty 94

## 2019-06-26 MED ORDER — SODIUM CHLORIDE 0.9 % IV SOLN
840.0000 mg | Freq: Once | INTRAVENOUS | Status: AC
  Administered 2019-06-26: 840 mg via INTRAVENOUS
  Filled 2019-06-26: qty 14

## 2019-06-26 MED ORDER — FLUOROURACIL CHEMO INJECTION 2.5 GM/50ML ALLIANCE A021502
400.0000 mg/m2 | Freq: Once | INTRAVENOUS | Status: AC
  Administered 2019-06-26: 800 mg via INTRAVENOUS
  Filled 2019-06-26: qty 16

## 2019-06-26 MED ORDER — SODIUM CHLORIDE 0.9 % IV SOLN
Freq: Once | INTRAVENOUS | Status: AC
  Administered 2019-06-26: 10:00:00 via INTRAVENOUS
  Filled 2019-06-26: qty 250

## 2019-06-26 MED ORDER — SODIUM CHLORIDE 0.9% FLUSH
10.0000 mL | INTRAVENOUS | Status: DC | PRN
  Administered 2019-06-26: 10 mL via INTRAVENOUS
  Filled 2019-06-26: qty 10

## 2019-06-26 NOTE — Addendum Note (Signed)
Addended by: [name] on: 06/26/2019 11:17 AM   Modules accepted: Level of Service

## 2019-06-26 NOTE — Addendum Note (Signed)
Addended by: Mila Merry on: 06/26/2019 11:02 AM   Modules accepted: Orders

## 2019-06-26 NOTE — Research (Addendum)
Patient Carrie Alvarez returns to clinic again this morning for her C10D1 treatment on the [id number] [name] study. Patient reports experiencing more peripheral neuropathy today. States she is experiencing problems gripping and holding on to objects now and says she cannot tell without looking whether she has shoes on her feet. States this is much worse now then even 2 weeks ago and states she feels it is affecting her ability to walk at times, and has become very bothersome, though she states it is not really painful. Local labs were collected via port-a-cath per protocol this morning and CBC and chemistry values were reviewed by Dr. Grayland Ormond. Potassium level is back wnl today. She is a little more anemic and platelets have decreased more, but overall lab values remain within acceptable range for patient to receive her treatment this morning. Temp stable and SpO2 remains at 100% on room air this morning with patient wearing a mask. B/P is low at 92/72. Patient denies any abdominal pain at present, but states she does experience occasional soreness at the incision site. States the pain is intermittant and does not interfere with her ADLs, but her fatigue remains significant and she does report not being able to do much of anything due to her level of fatigue - partially from her current chemotherapy and partially from baseline. Reports diarrhea has improved and now has 2-3 BMs daily - mostly soft or formed. Solicited adverse events were assessed by this RN again today and Ms. Dilone completed the C10 PRO-CTCAE booklet while in clinic, prior to her infusion. Patient remains in a wheelchair while in clinic due to her ongoing issues with fatigue, but states she is completely ambulatory and uses a cane while at home, though she believes her ability to walk has been affected by the neuropathy. She does report the cold sensitivity from Oxaliplatin infusion is constant now. Dr. Grayland Ormond has determined that since her  neuropathy and cold sensitivity are much worse today(grade 3) and have become bothersome to the patient, he will stop the Oxaliplatin altogether in order to avoid any worsening of her neuropathy. Patient had already been on a reduced dose of Oxaliplatin, but will now continue 5FU/Leucovorin and Atezolizumab per protocol without the Oxaliplatin for the remaining 3 cycles of chemotherapy. Patient is relieved about this and is in total agreement with this plan. Tenderness in the palms of her hands and the bottoms of her feet continues, and her skin remains darker in these areas. Patient's weight is still in consistent range of her baseline weight. Serum creatinine remains slightly elevated today at at 1.12mg /dL, and CrCl calculation is 74mL/min. Platelet count is decreased a little more to 103,000, still a grade 1. Patient's anemia is now at grade 2 with Hgb of 9.4g/dL following her chemotherapy 2 weeks ago. H&P completed by Dr. Grayland Ormond and patient will proceed with Cycle 10 5FU/Leucovorin only + atezolizumab today. Solicited and other Adverse events with grade and attribution are as noted below.   Adverse Event Log  Study/Protocol: [id number] [name] Cycle: Cycle 9 Event Grade Onset Date Resolved Date Drug Name Attribution Treatment Comments  Diarrhea 1 05/18/2019 01/10/2019  05/02/2019 n/a surgery Imodium Lomotil Decreased to 2-3 stools/day now   Abdominal pain 2 04/08/2019 01/10/2019 04/12/2019 04/08/2019 n/a surgery Oxycodone Reports pain resolved  Constipation 0      Denies  Nausea 1 06/07/2019 06/14/2019    Denies further nausea  Fatigue 2 06/12/2018 approx   ?  Reports no energy in past 6-9 months  Anorexia 1  07/13/2008   ?  States this is her norm for several years  Cough 0        Dyspnea 0        Fever 1 06/07/2019 06/07/2019    Temp 99.6 in ED  UTI 0        Palmer-plantar erythrodesia 1 03/13/2019    otc cream Bilateral palms and anterior fingers discolored and tender  Total biliruben 0         Neutrophils 0        TSH 0      No history of hypothyroidism  Sensory Peripheral Neuropathy 3 06/13/2019   Oxaliplatin Oxaliplatin discontinued Continuous, affecting ability to grasp objects & ambulation, ADLs  Sensory Peripheral Neuropathy 2 11/19/2014 06/13/2019  Paclitaxel Plus Oxaliplatin gabapentin Tingling in fingertips and feet bil. since taking Taxol  Hypokalemia 1 03/27/2019 02/27/2019 04/17/2019 03/13/2019  probable none K+ up to 3.5mg /dL today  Anemia 1 05/01/2019 06/26/2019      Anemia 2 06/26/2019 02/27/2019  04/17/2019  probable none Hgb 9.4  Elevated creatinine 1 02/07/2019   unrelated  Elevated at baseline CrCl: 18mL/min  Platelet count   decreased 1 05/29/2019 03/27/2019  04/17/2019  definitely none Plts down to 103,000 today -   Possible Colitis 2 04/08/2019 04/12/2019  probable Steroids Resolved  Weight Loss 1 04/10/2019 04/17/2019  probable none Weight back to normal    Proteinuria 2 06/12/2019   possible none 2+ Urine dipstick only  Proteinuria 1 05/15/2019 06/12/2019   none Urine dipstick only  Abdominal pain 1 05/22/2019   unlikely  Occasional incisional soreness  Hypokalemia 3 05/29/2019 06/07/2019  probable IV + oral K+ supplement K+ is wnl today  vomiting 1 06/06/2019 06/07/2019  unlikely  Resolved with IVFs  somnolence 2 06/05/2019 06/08/2019  unlikely    headache 2 06/05/2019 06/07/2019  unlikely  Had Toradol in ED  [name], BSN, MHA, OCN 06/26/2019 9:34 AM

## 2019-06-27 LAB — THYROID PANEL WITH TSH
Free Thyroxine Index: 1.2 (ref 1.2–4.9)
T3 Uptake Ratio: 24 % (ref 24–39)
T4, Total: 5.1 ug/dL (ref 4.5–12.0)
TSH: 1.93 u[IU]/mL (ref 0.450–4.500)

## 2019-06-28 ENCOUNTER — Inpatient Hospital Stay: Payer: Medicare Other

## 2019-06-28 ENCOUNTER — Encounter: Payer: Self-pay | Admitting: *Deleted

## 2019-06-28 ENCOUNTER — Other Ambulatory Visit: Payer: Self-pay

## 2019-06-28 VITALS — BP 119/85 | HR 78 | Temp 97.8°F | Resp 18

## 2019-06-28 DIAGNOSIS — C184 Malignant neoplasm of transverse colon: Secondary | ICD-10-CM

## 2019-06-28 DIAGNOSIS — Z5112 Encounter for antineoplastic immunotherapy: Secondary | ICD-10-CM | POA: Diagnosis not present

## 2019-06-28 MED ORDER — SODIUM CHLORIDE 0.9 % IV SOLN
Freq: Once | INTRAVENOUS | Status: AC
  Filled 2019-06-28: qty 250

## 2019-06-28 MED ORDER — HEPARIN SOD (PORK) LOCK FLUSH 100 UNIT/ML IV SOLN
INTRAVENOUS | Status: AC
  Filled 2019-06-28: qty 5

## 2019-06-28 MED ORDER — SODIUM CHLORIDE 0.9% FLUSH
10.0000 mL | INTRAVENOUS | Status: DC | PRN
  Filled 2019-06-28: qty 10

## 2019-06-28 MED ORDER — HEPARIN SOD (PORK) LOCK FLUSH 100 UNIT/ML IV SOLN
500.0000 [IU] | Freq: Once | INTRAVENOUS | Status: AC | PRN
  Administered 2019-06-28: 500 [IU]
  Filled 2019-06-28: qty 5

## 2019-06-28 NOTE — Progress Notes (Signed)
1337: rechecked b/p 86/71. Pt reports mild dizziness with position changes, pt denies any other symptoms or concerns. Pt reports drinking fluids at home. Dr. Grayland Ormond aware, Per Dr. Grayland Ormond pt to receive 1 Liter NS over 1 hour prior to discharge, pt agrees with plan. Per [name], Research RN, Dr. Grayland Ormond made aware of home medications that may affect the blood pressure.   1450: Pt Denies any concerns. No s/s of distress noted. Pt educated to call clinic with any questions or concerns or report to ER/call 911 in the event of an emergency. Pt verbalizes understanding. Pt and VS stable at discharge.

## 2019-06-28 NOTE — Research (Signed)
Patient Carrie Alvarez returned to clinic this afternoon as scheduled for discontinuation of 5FU pump. Her blood pressure was low on arrival and Dr. Grayland Ormond ordered for her to receive a liter of hydration fluids before discontinuing her port access. She is also due for Central study labs, which she was called and informed of earlier today. They were originally scheduled for her Cycle 11 treatment; however since an earlier treatment was delayed, she had to have the labs collected today in order to keep them in the study window. Central study labs were collected by Dorothy Spark, RN today and the ambient labs were shipped this afternoon to [facility] by Lula Olszewski. The frozen labs will be processed and placed in the -70 degree freezer for shipping at a later date. Patient was given a stool kit to take home along with instructions on collecting and processing the specimen prior to returning it to [place] frozen. Patient states she remembers this, but written instructions containing diagrams from the kit were given to patient for guidance.  [name], BSN, MHA, OCN 06/28/2019 2:16 PM

## 2019-07-04 ENCOUNTER — Ambulatory Visit
Admission: RE | Admit: 2019-07-04 | Discharge: 2019-07-04 | Disposition: A | Discharge status: Home / Self Care | Payer: Medicare Other | Source: Ambulatory Visit | Attending: Oncology | Admitting: Oncology

## 2019-07-04 DIAGNOSIS — C50919 Malignant neoplasm of unspecified site of unspecified female breast: Secondary | ICD-10-CM

## 2019-07-04 DIAGNOSIS — Z1231 Encounter for screening mammogram for malignant neoplasm of breast: Secondary | ICD-10-CM | POA: Diagnosis present

## 2019-07-04 DIAGNOSIS — Z853 Personal history of malignant neoplasm of breast: Secondary | ICD-10-CM | POA: Insufficient documentation

## 2019-07-04 DIAGNOSIS — C773 Secondary and unspecified malignant neoplasm of axilla and upper limb lymph nodes: Secondary | ICD-10-CM

## 2019-07-05 ENCOUNTER — Other Ambulatory Visit: Payer: Self-pay | Admitting: Oncology

## 2019-07-05 NOTE — Progress Notes (Signed)
[place]  [phone] [phone]  ID: Norm Salt OB: 16-Sep-1950  MR#: [id number]  RXV#:[id number]  Patient Care Team: Paulene Floor as PCP - General (Physician Assistant) [name], MD as Consulting Physician (Oncology) [name], MD as Attending Physician (Pain Medicine) [name]., MD as Consulting Physician (Ophthalmology) [name], RN as Oncology Nurse Navigator  CHIEF COMPLAINT: Stage IIa ER+ left breast cancer with no breast lesion and axillary lymph node metastasis.  Now with stage IIIa colon cancer.  INTERVAL HISTORY: Patient returns to clinic today for further evaluation and consideration of cycle 11 which is 5-FU plus Tecentriq.  Oxaliplatin has been discontinued secondary to worsening neuropathy.  Patient continues to have a poor appetite and is hypotensive today.  She has dizziness when standing.  She also is having acute onset right neck pain at the site of her port. Her chronic diarrhea is unchanged.  She has no other neurologic complaints.  She denies any recent fevers or illnesses.  She has no chest pain, shortness of breath, cough, or hemoptysis.  She denies any nausea, vomiting, or constipation.  She has no melena or hematochezia.  She has no urinary complaints.  Patient feels generally terrible, but offers no further specific complaints today.  REVIEW OF SYSTEMS:   Review of Systems  Constitutional: Positive for malaise/fatigue. Negative for fever and weight loss.  Respiratory: Negative.  Negative for cough and shortness of breath.   Cardiovascular: Negative.  Negative for chest pain and leg swelling.  Gastrointestinal: Positive for diarrhea. Negative for abdominal pain, blood in stool, constipation, melena, nausea and vomiting.  Genitourinary: Negative.  Negative for dysuria.  Musculoskeletal: Positive for neck pain. Negative for back pain.  Skin: Negative.  Negative for rash.    Neurological: Positive for dizziness, tingling, sensory change and weakness. Negative for focal weakness and headaches.  Psychiatric/Behavioral: Negative.  Negative for depression. The patient is not nervous/anxious.     As per HPI. Otherwise, a complete review of systems is negative.  PAST MEDICAL HISTORY: Past Medical History:  Diagnosis Date  . Allergy   . Anemia   . Back pain   . Breast cancer ([place]) 2015   LT LUMPECTOMY 12-2014 FOLLOWING CHEMO  . Carpal tunnel syndrome    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  . Cataract   . Colon cancer ([place]) 01/2019  . Depression   . Family history of breast cancer   . Family history of colon cancer   . GERD (gastroesophageal reflux disease)    problem with reflux during chemo  . History of methicillin resistant staphylococcus aureus (MRSA)   . Hypercholesteremia   . Hypertension   . Lumbosacral pain    sees pain management in [place]  . Obesity   . Personal history of chemotherapy 2016   left breast ca  . Personal history of chemotherapy 2020   colon ca  . Pinched nerve    left side, lumbar area  . Uterine cancer ([place]) 1994    PAST SURGICAL HISTORY: Past Surgical History:  Procedure Laterality Date  . ABDOMINAL HYSTERECTOMY  1994   UTERINE CA  . AXILLARY LYMPH NODE BIOPSY Left 12/18/2014   Procedure: AXILLARY LYMPH NODE BIOPSY/;  Surgeon: Robert Bellow, MD;  Location: [facility];  Service: General;  Laterality: Left;  . AXILLARY LYMPH NODE DISSECTION Left 12/18/2014   Procedure: AXILLARY LYMPH NODE DISSECTION;  Surgeon: Robert Bellow, MD;  Location: [facility];  Service:  General;  Laterality: Left;  . BREAST BIOPSY Left 05/2014   CORE BX OF LN, METASTATIC ADENOCARCINOMA  . BREAST LUMPECTOMY Left 12/2014   CHEMO FIRST THEN SURGERY OF LN  . BREAST SURGERY     lymph node removal  . CHOLECYSTECTOMY N/A 04/19/2016   Procedure: LAPAROSCOPIC CHOLECYSTECTOMY WITH INTRAOPERATIVE CHOLANGIOGRAM;  Surgeon: Hubbard Robinson, MD;   Location: [facility];  Service: General;  Laterality: N/A;  . COLON RESECTION Right 01/10/2019   Procedure: HAND ASSISTED LAPAROSCOPIC RIGHT COLON RESECTION;  Surgeon: Jules Husbands, MD;  Location: [facility];  Service: General;  Laterality: Right;  . COLONOSCOPY WITH PROPOFOL N/A 12/30/2018   Procedure: COLONOSCOPY WITH PROPOFOL;  Surgeon: Lucilla Lame, MD;  Location: [facility];  Service: Endoscopy;  Laterality: N/A;  . medial branch block  11/06/2015   lumbar facet Dr. Primus Bravo  . POLYPECTOMY N/A 12/30/2018   Procedure: POLYPECTOMY;  Surgeon: Lucilla Lame, MD;  Location: [facility];  Service: Endoscopy;  Laterality: N/A;  Clips placed at Hepatic Flexure Polyp (2) and Transverse Colon Polyp (4) removal sites  . PORTACATH PLACEMENT Right 02/02/2019   Procedure: INSERTION PORT-A-CATH;  Surgeon: Jules Husbands, MD;  Location: [facility];  Service: General;  Laterality: Right;  . SENTINEL NODE BIOPSY Left 12/18/2014   Procedure: SENTINEL NODE BIOPSY;  Surgeon: Robert Bellow, MD;  Location: [facility];  Service: General;  Laterality: Left;    FAMILY HISTORY Family History  Problem Relation Age of Onset  . Breast cancer Sister 57  . Colon cancer Sister   . Colon cancer Mother        dx 73s  . Hypertension Mother   . Asthma Mother   . Cancer Father        voice box removed  . Cancer Maternal Grandmother        unsure type  . Colon cancer Cousin   . Cancer Cousin        unsure type       ADVANCED DIRECTIVES:    HEALTH MAINTENANCE: Social History   Tobacco Use  . Smoking status: Former Smoker    Packs/day: 0.50    Types: Cigarettes    Quit date: 07/18/2014    Years since quitting: 4.9  . Smokeless tobacco: Never Used  Substance Use Topics  . Alcohol use: No    Alcohol/week: 0.0 standard drinks  . Drug use: No    No Known Allergies  Current Outpatient Medications  Medication Sig Dispense Refill  . acetaminophen (TYLENOL) 500 MG tablet Take 500 mg by mouth every 6  (six) hours as needed for mild pain or moderate pain.     Marland Kitchen albuterol (PROVENTIL HFA;VENTOLIN HFA) 108 (90 Base) MCG/ACT inhaler Inhale 2 puffs into the lungs every 6 (six) hours as needed for wheezing or shortness of breath. 1 Inhaler 2  . amLODipine (NORVASC) 10 MG tablet Take 1 tablet by mouth once daily 90 tablet 0  . atorvastatin (LIPITOR) 10 MG tablet TAKE 1 TABLET BY MOUTH ONCE DAILY IN THE MORNING 90 tablet 0  . baclofen (LIORESAL) 10 MG tablet Take 1 tablet (10 mg total) by mouth 2 (two) times daily. (Patient taking differently: Take 10 mg by mouth 3 (three) times daily as needed for muscle spasms. ) 30 each 0  . cholecalciferol (VITAMIN D) 1000 units tablet Take 1,000 Units by mouth daily.    . diphenoxylate-atropine (LOMOTIL) 2.5-0.025 MG tablet Take 1 tablet by mouth 4 (four) times daily as needed for  diarrhea or loose stools. 30 tablet 0  . fluticasone (FLONASE) 50 MCG/ACT nasal spray Use 2 spray(s) in each nostril once daily 16 g 0  . gabapentin (NEURONTIN) 300 MG capsule Take 300-600 mg by mouth 4 (four) times daily as needed (neuropathic pain).     Marland Kitchen lidocaine-prilocaine (EMLA) cream Apply to affected area once 30 g 3  . ondansetron (ZOFRAN ODT) 4 MG disintegrating tablet Take 1 tablet (4 mg total) by mouth every 8 (eight) hours as needed for nausea or vomiting. 20 tablet 0  . Oxycodone HCl 10 MG TABS LIMIT ONE HALF TO ONE TABLET BY MOUTH 3 TO 5 TIMES DAILY IF TOLERATED    . potassium chloride SA (KLOR-CON) 20 MEQ tablet Take 1 tablet (20 mEq total) by mouth 2 (two) times daily. 60 tablet 1  . predniSONE (DELTASONE) 50 MG tablet Take 1 tab daily for a total of 7 days. 7 tablet 0  . pyridoxine (B-6) 100 MG tablet Take 100 mg by mouth daily.    . sertraline (ZOLOFT) 100 MG tablet Take 2 tablets by mouth once daily 180 tablet 0  . vitamin B-12 (CYANOCOBALAMIN) 500 MCG tablet Take 500 mcg by mouth daily.     No current facility-administered medications for this visit.    Facility-Administered Medications Ordered in Other Visits  Medication Dose Route Frequency Provider Last Rate Last Admin  . fluorouracil ALLIANCE [id number] (ADRUCIL) 4,700 mg in sodium chloride 0.9 % 56 mL chemo infusion  2,400 mg/m2 (Treatment Plan Recorded) Intravenous 1 day or 1 dose [name], MD   4,700 mg at 02/27/19 1448  . heparin lock flush 100 unit/mL  500 Units Intravenous Once [name], MD      . heparin lock flush 100 unit/mL  500 Units Intravenous Once [name], MD      . heparin lock flush 100 unit/mL  500 Units Intravenous Once [name], MD      . sodium chloride flush (NS) 0.9 % injection 10 mL  10 mL Intravenous PRN [name], MD   10 mL at 02/27/19 0925    OBJECTIVE: Vitals:   07/11/19 0842 07/11/19 0843  BP: (!) 87/65   Pulse: 95   Resp: 16   Temp:  (!) 96.9 F (36.1 C)  SpO2: 100%      Body mass index is 34.42 kg/m.    ECOG FS:1 - Symptomatic but completely ambulatory  General: Well-developed, well-nourished, no acute distress.  Sitting in a wheelchair. Eyes: Pink conjunctiva, anicteric sclera. HEENT: Normocephalic, moist mucous membranes.  No palpable adenopathy or masses. Lungs: No audible wheezing or coughing. Heart: Regular rate and rhythm. Abdomen: Soft, nontender, no obvious distention. Musculoskeletal: No edema, cyanosis, or clubbing. Neuro: Alert, answering all questions appropriately. Cranial nerves grossly intact. Skin: No rashes or petechiae noted. Psych: Normal affect.   LAB RESULTS:  Lab Results  Component Value Date   NA 138 07/11/2019   K 3.5 07/11/2019   CL 107 07/11/2019   CO2 22 07/11/2019   GLUCOSE 95 07/11/2019   BUN 14 07/11/2019   CREATININE 1.15 (H) 07/11/2019   CALCIUM 9.3 07/11/2019   PROT 6.5 07/11/2019   ALBUMIN 3.6 07/11/2019   AST 21 07/11/2019   ALT 14 07/11/2019   ALKPHOS 65 07/11/2019   BILITOT 0.7 07/11/2019   GFRNONAA 49 (L) 07/11/2019   GFRAA 57 (L)  07/11/2019    Lab Results  Component Value Date   WBC 4.9 07/11/2019  NEUTROABS 2.9 07/11/2019   HGB 9.0 (L) 07/11/2019   HCT 27.4 (L) 07/11/2019   MCV 91.0 07/11/2019   PLT 103 (L) 07/11/2019     STUDIES: MM 3D SCREEN BREAST BILATERAL  Result Date: 07/05/2019 CLINICAL DATA:  Screening. Patient has a history of what was diagnosed as metastatic breast carcinoma to a left axillary lymph node. She has been treated with chemotherapy. No breast malignancy was found either mammography, ultrasound or breast MRI. EXAM: DIGITAL SCREENING BILATERAL MAMMOGRAM WITH TOMO AND CAD COMPARISON:  Previous exam(s). ACR Breast Density Category c: The breast tissue is heterogeneously dense, which may obscure small masses. FINDINGS: There are no findings suspicious for malignancy. Images were processed with CAD. IMPRESSION: No mammographic evidence of malignancy. A result letter of this screening mammogram will be mailed directly to the patient. RECOMMENDATION: Screening mammogram in one year. (Code:SM-B-01Y) BI-RADS CATEGORY  1: Negative. Electronically Signed   By: Lajean Manes M.D.   On: 07/05/2019 10:22    ASSESSMENT: Stage IIa ER+ left breast cancer with no breast lesion and axillary lymph node metastasis. (TxN1M0)  HER-2 negative.  Now with stage IIIa colon cancer.  PLAN:   1.  Stage IIIa colon cancer: Pathology report reviewed independently.  CT scan of her abdomen on January 02, 2019 that did not reveal metastatic disease.  CT scan of the chest on February 07, 2019 also was negative for metastatic disease.  Patient agreed to enroll in clinical trial.  Oxaliplatin has been discontinued secondary to worsening neuropathy.  Will delay cycle 11 of 5-FU plus Tecentriq today given hypotension and dizziness.  Patient instead will receive 2 L of IV fluids.  Return to clinic in 1 week for further evaluation and reconsideration of treatment.  Once patient completes cycle 12, she will receive Tecentriq every 3 weeks for 1  year.   2. Stage IIa ER+ left breast cancer with no breast lesion and axillary lymph node metastasis: Despite no obvious breast lesion on mammogram or breast MRI, pathology and pattern of spread was consistent with primary breast cancer. CT and bone scan at time of diagnosis revealed no other evidence of malignancy.  She only received 3 cycles of Adriamycin and Cytoxan. Taxol was discontinued early as well secondary to persistent peripheral neuropathy. Patient completed her chemotherapy on Nov 19, 2014.  She elected not to pursue axillary node dissection given the potential morbidity of this procedure. It was also elected not to pursue adjuvant XRT given there was no primary breast lesion.  Patient completed approximately 4 years of treatment with letrozole, but this has been discontinued secondary to enrolling in clinical trial for her newly diagnosed colon cancer.  Her most recent mammogram on July 04, 2019 was reported as BI-RADS 1.  Repeat in December 2021. 3.  Bone health: Patient's most recent bone mineral density on February 03, 2018 reported T score of -0.7 which is unchanged from 3 years prior.  Consider repeat bone mineral density in July 2021.  4.  Lynch syndrome: Genetic testing confirmed patient has Lynch syndrome.  She reports her sister and son both have tested positive.  She plans to talk to the rest of her children about genetic testing.  Appreciate genetics input. 5.  Endometrial cancer: Patient has had a total hysterectomy. 6.  Pain/Peripheral neuropathy: Grade 3. Discontinue oxaliplatin as above.  Agree with current medications from pain clinic.  Continue follow-up with them as indicated.   7. Anemia: Hemoglobin continues to slowly trend down is now 9.0. 8.  Thrombocytopenia: Chronic and unchanged.  Platelets are 103. 9.  Renal insufficiency: Chronic and unchanged.  Patient's creatinine is 1.15 today. 10.  Hypokalemia: Resolved.  Continue oral potassium supplementation 20 mEq twice per  day. 11.  Leukopenia: Resolved. 12.  Diarrhea: Chronic and unchanged.  Continue Imodium as needed. 13.  Fatigue: Improved.  Patient has no new medications, she is anemic but this is relatively unchanged.  Thyroid studies are within normal limits.  She states her sleep habits are good.  Continue to monitor and consider a trial of dexamethasone or Remeron. 14.  Hypotension: 2 L IV fluid as above.  Patient has been instructed to hold Norvasc until she has reevaluation next week. 15.  Neck pain: Will get ultrasound of neck to ensure there is no clot associated with her port.   Patient expressed understanding and was in agreement with this plan. She also understands that She can call clinic at any time with any questions, concerns, or complaints.   Breast cancer metastasized to axillary lymph node   Staging form: Breast, AJCC 7th Edition     Clinical stage from 10/22/2014: Stage Unknown (TX, N1, M0) - Signed by [name], MD on 10/22/2014   [name], MD   07/11/2019 9:35 AM

## 2019-07-09 ENCOUNTER — Other Ambulatory Visit: Payer: Self-pay | Admitting: Oncology

## 2019-07-09 DIAGNOSIS — C773 Secondary and unspecified malignant neoplasm of axilla and upper limb lymph nodes: Secondary | ICD-10-CM

## 2019-07-09 DIAGNOSIS — C50919 Malignant neoplasm of unspecified site of unspecified female breast: Secondary | ICD-10-CM

## 2019-07-09 DIAGNOSIS — C50912 Malignant neoplasm of unspecified site of left female breast: Secondary | ICD-10-CM

## 2019-07-10 ENCOUNTER — Other Ambulatory Visit: Payer: Self-pay

## 2019-07-10 NOTE — Progress Notes (Signed)
Patient pre screened for office appointment, no questions or concerns today. Patient reminded of upcoming appointment time and date. 

## 2019-07-11 ENCOUNTER — Other Ambulatory Visit: Payer: Self-pay | Admitting: Oncology

## 2019-07-11 ENCOUNTER — Inpatient Hospital Stay: Payer: Medicare Other

## 2019-07-11 ENCOUNTER — Other Ambulatory Visit: Payer: Self-pay | Admitting: Emergency Medicine

## 2019-07-11 ENCOUNTER — Ambulatory Visit
Admission: RE | Admit: 2019-07-11 | Discharge: 2019-07-11 | Disposition: A | Discharge status: Home / Self Care | Payer: Medicare Other | Source: Ambulatory Visit | Attending: Oncology | Admitting: Oncology

## 2019-07-11 ENCOUNTER — Other Ambulatory Visit: Payer: Self-pay

## 2019-07-11 ENCOUNTER — Inpatient Hospital Stay (HOSPITAL_BASED_OUTPATIENT_CLINIC_OR_DEPARTMENT_OTHER): Payer: Medicare Other | Admitting: Oncology

## 2019-07-11 ENCOUNTER — Encounter: Payer: Self-pay | Admitting: *Deleted

## 2019-07-11 VITALS — BP 87/65 | HR 95 | Temp 96.9°F | Resp 16 | Wt 194.3 lb

## 2019-07-11 VITALS — BP 116/74 | HR 76

## 2019-07-11 DIAGNOSIS — C184 Malignant neoplasm of transverse colon: Secondary | ICD-10-CM

## 2019-07-11 DIAGNOSIS — Z95828 Presence of other vascular implants and grafts: Secondary | ICD-10-CM | POA: Insufficient documentation

## 2019-07-11 DIAGNOSIS — Z006 Encounter for examination for normal comparison and control in clinical research program: Secondary | ICD-10-CM | POA: Diagnosis not present

## 2019-07-11 DIAGNOSIS — T82898A Other specified complication of vascular prosthetic devices, implants and grafts, initial encounter: Secondary | ICD-10-CM

## 2019-07-11 DIAGNOSIS — Z5112 Encounter for antineoplastic immunotherapy: Secondary | ICD-10-CM | POA: Diagnosis not present

## 2019-07-11 LAB — COMPREHENSIVE METABOLIC PANEL
ALT: 14 U/L (ref 0–44)
AST: 21 U/L (ref 15–41)
Albumin: 3.6 g/dL (ref 3.5–5.0)
Alkaline Phosphatase: 65 U/L (ref 38–126)
Anion gap: 9 (ref 5–15)
BUN: 14 mg/dL (ref 8–23)
CO2: 22 mmol/L (ref 22–32)
Calcium: 9.3 mg/dL (ref 8.9–10.3)
Chloride: 107 mmol/L (ref 98–111)
Creatinine, Ser: 1.15 mg/dL — ABNORMAL HIGH (ref 0.44–1.00)
GFR calc Af Amer: 57 mL/min — ABNORMAL LOW (ref >60)
GFR calc non Af Amer: 49 mL/min — ABNORMAL LOW (ref >60)
Glucose, Bld: 95 mg/dL (ref 70–99)
Potassium: 3.5 mmol/L (ref 3.5–5.1)
Sodium: 138 mmol/L (ref 135–145)
Total Bilirubin: 0.7 mg/dL (ref 0.3–1.2)
Total Protein: 6.5 g/dL (ref 6.5–8.1)

## 2019-07-11 LAB — CBC WITH DIFFERENTIAL/PLATELET
Abs Immature Granulocytes: 0.02 10*3/uL (ref 0.00–0.07)
Basophils Absolute: 0 10*3/uL (ref 0.0–0.1)
Basophils Relative: 1 %
Eosinophils Absolute: 0.1 10*3/uL (ref 0.0–0.5)
Eosinophils Relative: 1 %
HCT: 27.4 % — ABNORMAL LOW (ref 36.0–46.0)
Hemoglobin: 9 g/dL — ABNORMAL LOW (ref 12.0–15.0)
Immature Granulocytes: 0 %
Lymphocytes Relative: 30 %
Lymphs Abs: 1.5 10*3/uL (ref 0.7–4.0)
MCH: 29.9 pg (ref 26.0–34.0)
MCHC: 32.8 g/dL (ref 30.0–36.0)
MCV: 91 fL (ref 80.0–100.0)
Monocytes Absolute: 0.5 10*3/uL (ref 0.1–1.0)
Monocytes Relative: 10 %
Neutro Abs: 2.9 10*3/uL (ref 1.7–7.7)
Neutrophils Relative %: 58 %
Platelets: 103 10*3/uL — ABNORMAL LOW (ref 150–400)
RBC: 3.01 MIL/uL — ABNORMAL LOW (ref 3.87–5.11)
RDW: 18.4 % — ABNORMAL HIGH (ref 11.5–15.5)
WBC: 4.9 10*3/uL (ref 4.0–10.5)
nRBC: 0 % (ref 0.0–0.2)

## 2019-07-11 MED ORDER — HEPARIN SOD (PORK) LOCK FLUSH 100 UNIT/ML IV SOLN
500.0000 [IU] | Freq: Once | INTRAVENOUS | Status: AC
  Administered 2019-07-11: 500 [IU] via INTRAVENOUS
  Filled 2019-07-11: qty 5

## 2019-07-11 MED ORDER — SODIUM CHLORIDE 0.9 % IV SOLN
Freq: Once | INTRAVENOUS | Status: AC
  Filled 2019-07-11: qty 250

## 2019-07-11 MED ORDER — APIXABAN 5 MG PO TABS: 5.0000 mg | ORAL_TABLET | Freq: Two times a day (BID) | ORAL | 3 refills | Status: DC

## 2019-07-11 MED ORDER — HEPARIN SOD (PORK) LOCK FLUSH 100 UNIT/ML IV SOLN
INTRAVENOUS | Status: AC
  Filled 2019-07-11: qty 5

## 2019-07-11 MED ORDER — SODIUM CHLORIDE 0.9% FLUSH
10.0000 mL | Freq: Once | INTRAVENOUS | Status: AC
  Administered 2019-07-11: 10 mL via INTRAVENOUS
  Filled 2019-07-11: qty 10

## 2019-07-11 NOTE — Research (Addendum)
Patient Carrie Alvarez returns to clinic again this morning for her C11D1 treatment on the [id number] [name] study. Patient reports experiencing more increased weakness and drowsiness today, and her B/P is very low again at 87/65. States her peripheral neuropathy is better today, but still present in fingertips, toes and bottom of her feet. States she is better able to  grip and hold on to objects now and feels it has helped her to not receive Oxaliplatin. She is also reporting some pain and tenderness on the right side of her neck at the site of her port. Dr. Grayland Ormond will order an ultrasound of this area while patient is in clinic this morning. Local labs were collected via port-a-cath per protocol this morning and CBC and chemistry values were reviewed by Dr. Grayland Ormond. She remains anemic and platelets are low, but stable. Temp 96.9 and SpO2 remains at 100% on room air this morning with patient wearing a mask. Patient denies any abdominal pain at present, but continues to experience occasional soreness at the incision site. States the pain is intermittant and does not interfere with her ADLs, but her fatigue remains significant and she does report not being able to do much of anything due to her level of fatigue - partially from her current chemotherapy and partially from baseline. Reports diarrhea has improved and now has 2-4 BMs daily - depending on how often she eats. States she did collect the stool specimen for the study, but forgot and left it in her freezer at home. Plans to bring it when she returns to clinic next. Solicited adverse events were assessed by this RN today, but the PRO-CTCAE booklet was not completed after Dr. Grayland Ormond saw patient and decided to hold her treatment today due to her increased weakness and low blood pressure. He instructed her to hold her Norvasc this week, the increase her PO fluid intake and he will reschedule her to return to clinic on 07/17/2019 to be re-evaluated for  chemotherapy. He discussed the possibility of stopping the 5FU if she does not improve and just continuing with the Atezolizumab only per protocol. Patient reluctantly agrees with this plan, and states she just wants to finish the chemo. Patient remains in a wheelchair while in clinic due to her ongoing issues with fatigue, but states she is ambulatory and uses a cane while at hom. She denies any further cold sensitivity since Oxaliplatin infusions discontinued. Tenderness in the palms of her hands and the bottoms of her feet continues, and her skin remains darker in these areas. No weight loss noted. Serum creatinine remains slightly elevated today at at 1.15mg /dL, and CrCl calculation is 65.53mL/min. Platelet count is 103,000, still a grade 1. Patient's anemia remains at grade 2 with Hgb of 9.0g/dL following her chemotherapy 2 weeks ago. H&P completed by Dr. Grayland Ormond and patient will not receive C11 chemotherapy+Atezolizumab as scheduled today, but will receive hydration fluids and will have an U/S evaluation of her right neck/port-a-cath site. Solicited and other Adverse events with grade and attribution are as noted below.   Adverse Event Log  Study/Protocol: [id number] [name] Cycle: Cycle 10 Event Grade Onset Date Resolved Date Drug Name Attribution Treatment Comments  Diarrhea 1 05/18/2019 01/10/2019  05/02/2019 n/a surgery Imodium Lomotil Decreased to 2-4 stools/day now   Abdominal pain 2 04/08/2019 01/10/2019 04/12/2019 04/08/2019 n/a surgery Oxycodone Reports pain resolved  Constipation 0      Denies  Nausea 1 06/07/2019 06/14/2019    Denies further nausea  Fatigue 2 06/12/2018 approx   ?  Reports no energy in past 12 months  Anorexia 1 07/13/2008   ?  States this is her norm for several years  Cough 0        Dyspnea 0        Fever 1 06/07/2019 06/07/2019    Temp 99.6 in ED  UTI 0        Palmer-plantar erythrodesia 1 03/13/2019    otc cream Bilateral palms and anterior fingers discolored  and tender  Total biliruben 0        Neutrophils 0        TSH 0      No history of hypothyroidism  Sensory Peripheral Neuropathy 3 06/13/2019 07/10/2019  Oxaliplatin Oxaliplatin discontinued Continuous, affecting ability to grasp objects & ambulation, ADLs  Sensory Peripheral Neuropathy 2 07/11/2019 11/19/2014  06/13/2019  Paclitaxel Plus Oxaliplatin gabapentin Tingling in fingertips and feet bil. since taking Taxol  Hypokalemia 1 03/27/2019 02/27/2019 04/17/2019 03/13/2019  probable none K+ up to 3.5mg /dL today  Anemia 1 05/01/2019 06/26/2019      Anemia 2 06/26/2019 02/27/2019  04/17/2019  probable none Hgb 9.0 on 07/10/19  Elevated creatinine 1 02/07/2019   unrelated  Elevated at baseline CrCl: 65.54mL/min  Platelet count   decreased 1 05/29/2019 03/27/2019  04/17/2019  definitely none Plts down to 103,000 today -   Possible Colitis 2 04/08/2019 04/12/2019  probable Steroids Resolved  Weight Loss 1 04/10/2019 04/17/2019  probable none Weight back to normal    Proteinuria 1 05/15/2019    none Urine dipstick only  Abdominal pain 1 05/22/2019   unlikely  Occasional incisional soreness  Hypokalemia 3 05/29/2019 06/26/2019  probable IV + oral K+ supplement K+ is 2.9 mmol/L today  vomiting 1 06/06/2019 06/07/2019  unlikely  Resolved with IVFs  somnolence 2 06/05/2019 06/08/2019  unlikely    headache 2 06/05/2019 06/07/2019  unlikely  Had Toradol in ED  Port associated  thrombosis 3 07/10/2019   unlikely Eliquis Pt reported pain in right side of neck at port site  Hypotension 2 06/12/2019   possible IV fluids   Carrie Alvarez, BSN, MHA, OCN 07/11/2019 9:30 AM  Note: [place] study showed port-a-cath associated IJ thrombosis. Patient started on Eliquis 5mg  twice daily to manage.

## 2019-07-12 NOTE — Progress Notes (Signed)
Carrie Alvarez  [phone] [phone]) [phone]  ID: Carrie Alvarez OB: 1951-05-17  MR#: [id number]  YWV#:[id number]  Patient Care Team: Carrie Alvarez as PCP - General (Physician Assistant) Carrie Huger, MD as Consulting Physician (Oncology) Carrie Kindle, MD as Attending Physician (Pain Medicine) Carrie Cover., MD as Consulting Physician (Ophthalmology) Carrie Jacks, RN as Oncology Nurse Navigator  CHIEF COMPLAINT: Stage IIa ER+ left breast cancer with no breast lesion and axillary lymph node metastasis.  Now with stage IIIa colon cancer.  INTERVAL HISTORY: Patient returns to clinic today for further evaluation and reconsideration of cycle 11 of treatment which is 5-FU plus Tecentriq.  She feels significantly improved over last week, but states she still has a poor appetite.  She continues to have chronic diarrhea that is controlled with Imodium.  Her peripheral neuropathy is unchanged.  Her neck pain has improved.  She has no other neurologic complaints.  She denies any recent fevers or illnesses.  She has no chest pain, shortness of breath, cough, or hemoptysis.  She denies any nausea, vomiting, or constipation.  She has no melena or hematochezia.  She has no urinary complaints.  Patient offers no further specific complaints today.  REVIEW OF SYSTEMS:   Review of Systems  Constitutional: Positive for malaise/fatigue. Negative for fever and weight loss.  Respiratory: Negative.  Negative for cough and shortness of breath.   Cardiovascular: Negative.  Negative for chest pain and leg swelling.  Gastrointestinal: Positive for diarrhea. Negative for abdominal pain, blood in stool, constipation, melena, nausea and vomiting.  Genitourinary: Negative.  Negative for dysuria.  Musculoskeletal: Negative.  Negative for back pain and neck pain.  Skin: Negative.  Negative for rash.  Neurological: Positive for tingling, sensory change and  weakness. Negative for dizziness, focal weakness and headaches.  Psychiatric/Behavioral: Negative.  Negative for depression. The patient is not nervous/anxious.     As per HPI. Otherwise, a complete review of systems is negative.  PAST MEDICAL HISTORY: Past Medical History:  Diagnosis Date  . Allergy   . Anemia   . Back pain   . Breast cancer ([place]) 2015   LT LUMPECTOMY 12-2014 FOLLOWING CHEMO  . Carpal tunnel syndrome    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  . Cataract   . Colon cancer ([place]) 01/2019  . Depression   . Family history of breast cancer   . Family history of colon cancer   . GERD (gastroesophageal reflux disease)    problem with reflux during chemo  . History of methicillin resistant staphylococcus aureus (MRSA)   . Hypercholesteremia   . Hypertension   . Lumbosacral pain    sees pain management in [place]  . Obesity   . Personal history of chemotherapy 2016   left breast ca  . Personal history of chemotherapy 2020   colon ca  . Pinched nerve    left side, lumbar area  . Uterine cancer ([place]) 1994    PAST SURGICAL HISTORY: Past Surgical History:  Procedure Laterality Date  . ABDOMINAL HYSTERECTOMY  1994   UTERINE CA  . AXILLARY LYMPH NODE BIOPSY Left 12/18/2014   Procedure: AXILLARY LYMPH NODE BIOPSY/;  Surgeon: Robert Bellow, MD;  Location: [facility];  Service: General;  Laterality: Left;  . AXILLARY LYMPH NODE DISSECTION Left 12/18/2014   Procedure: AXILLARY LYMPH NODE DISSECTION;  Surgeon: Robert Bellow, MD;  Location: [facility];  Service: General;  Laterality: Left;  . BREAST BIOPSY Left 05/2014  CORE BX OF LN, METASTATIC ADENOCARCINOMA  . BREAST LUMPECTOMY Left 12/2014   CHEMO FIRST THEN SURGERY OF LN  . BREAST SURGERY     lymph node removal  . CHOLECYSTECTOMY N/A 04/19/2016   Procedure: LAPAROSCOPIC CHOLECYSTECTOMY WITH INTRAOPERATIVE CHOLANGIOGRAM;  Surgeon: Hubbard Robinson, MD;  Location: [facility];  Service: General;  Laterality: N/A;    . COLON RESECTION Right 01/10/2019   Procedure: HAND ASSISTED LAPAROSCOPIC RIGHT COLON RESECTION;  Surgeon: Jules Husbands, MD;  Location: [facility];  Service: General;  Laterality: Right;  . COLONOSCOPY WITH PROPOFOL N/A 12/30/2018   Procedure: COLONOSCOPY WITH PROPOFOL;  Surgeon: Lucilla Lame, MD;  Location: [facility];  Service: Endoscopy;  Laterality: N/A;  . medial branch block  11/06/2015   lumbar facet Dr. Primus Bravo  . POLYPECTOMY N/A 12/30/2018   Procedure: POLYPECTOMY;  Surgeon: Lucilla Lame, MD;  Location: [facility];  Service: Endoscopy;  Laterality: N/A;  Clips placed at Hepatic Flexure Polyp (2) and Transverse Colon Polyp (4) removal sites  . PORTACATH PLACEMENT Right 02/02/2019   Procedure: INSERTION PORT-A-CATH;  Surgeon: Jules Husbands, MD;  Location: [facility];  Service: General;  Laterality: Right;  . SENTINEL NODE BIOPSY Left 12/18/2014   Procedure: SENTINEL NODE BIOPSY;  Surgeon: Robert Bellow, MD;  Location: [facility];  Service: General;  Laterality: Left;    FAMILY HISTORY Family History  Problem Relation Age of Onset  . Breast cancer Sister 36  . Colon cancer Sister   . Colon cancer Mother        dx 57s  . Hypertension Mother   . Asthma Mother   . Cancer Father        voice box removed  . Cancer Maternal Grandmother        unsure type  . Colon cancer Cousin   . Cancer Cousin        unsure type       ADVANCED DIRECTIVES:    HEALTH MAINTENANCE: Social History   Tobacco Use  . Smoking status: Former Smoker    Packs/day: 0.50    Types: Cigarettes    Quit date: 07/18/2014    Years since quitting: 5.0  . Smokeless tobacco: Never Used  Substance Use Topics  . Alcohol use: No    Alcohol/week: 0.0 standard drinks  . Drug use: No    No Known Allergies  Current Outpatient Medications  Medication Sig Dispense Refill  . acetaminophen (TYLENOL) 500 MG tablet Take 500 mg by mouth every 6 (six) hours as needed for mild pain or moderate pain.      Marland Kitchen albuterol (PROVENTIL HFA;VENTOLIN HFA) 108 (90 Base) MCG/ACT inhaler Inhale 2 puffs into the lungs every 6 (six) hours as needed for wheezing or shortness of breath. 1 Inhaler 2  . amLODipine (NORVASC) 10 MG tablet Take 1 tablet by mouth once daily 90 tablet 0  . apixaban (ELIQUIS) 5 MG TABS tablet Take 1 tablet (5 mg total) by mouth 2 (two) times daily. 60 tablet 3  . atorvastatin (LIPITOR) 10 MG tablet TAKE 1 TABLET BY MOUTH ONCE DAILY IN THE MORNING 90 tablet 0  . baclofen (LIORESAL) 10 MG tablet Take 1 tablet (10 mg total) by mouth 2 (two) times daily. (Patient taking differently: Take 10 mg by mouth 3 (three) times daily as needed for muscle spasms. ) 30 each 0  . cholecalciferol (VITAMIN D) 1000 units tablet Take 1,000 Units by mouth daily.    . diphenoxylate-atropine (LOMOTIL) 2.5-0.025 MG tablet  Take 1 tablet by mouth 4 (four) times daily as needed for diarrhea or loose stools. 30 tablet 0  . fluticasone (FLONASE) 50 MCG/ACT nasal spray Use 2 spray(s) in each nostril once daily 16 g 0  . gabapentin (NEURONTIN) 300 MG capsule Take 300-600 mg by mouth 4 (four) times daily as needed (neuropathic pain).     Marland Kitchen lidocaine-prilocaine (EMLA) cream Apply to affected area once 30 g 3  . ondansetron (ZOFRAN ODT) 4 MG disintegrating tablet Take 1 tablet (4 mg total) by mouth every 8 (eight) hours as needed for nausea or vomiting. 20 tablet 0  . Oxycodone HCl 10 MG TABS LIMIT ONE HALF TO ONE TABLET BY MOUTH 3 TO 5 TIMES DAILY IF TOLERATED    . potassium chloride SA (KLOR-CON) 20 MEQ tablet Take 1 tablet (20 mEq total) by mouth 2 (two) times daily. 60 tablet 1  . predniSONE (DELTASONE) 50 MG tablet Take 1 tab daily for a total of 7 days. 7 tablet 0  . pyridoxine (B-6) 100 MG tablet Take 100 mg by mouth daily.    . sertraline (ZOLOFT) 100 MG tablet Take 2 tablets by mouth once daily 180 tablet 0  . vitamin B-12 (CYANOCOBALAMIN) 500 MCG tablet Take 500 mcg by mouth daily.     No current  facility-administered medications for this visit.   Facility-Administered Medications Ordered in Other Visits  Medication Dose Route Frequency Provider Last Rate Last Admin  . fluorouracil ALLIANCE [id number] (ADRUCIL) 4,700 mg in sodium chloride 0.9 % 56 mL chemo infusion  2,400 mg/m2 (Treatment Plan Recorded) Intravenous 1 day or 1 dose Carrie Huger, MD   4,700 mg at 02/27/19 1448  . heparin lock flush 100 unit/mL  500 Units Intravenous Once Carrie Huger, MD      . heparin lock flush 100 unit/mL  500 Units Intravenous Once Carrie Huger, MD      . heparin lock flush 100 unit/mL  500 Units Intravenous Once Carrie Huger, MD      . sodium chloride flush (NS) 0.9 % injection 10 mL  10 mL Intravenous PRN Carrie Huger, MD   10 mL at 02/27/19 0925    OBJECTIVE: Vitals:   07/17/19 0842  BP: 106/79  Pulse: 86  Resp: 18  Temp: (!) 97.1 F (36.2 C)  SpO2: 100%     Body mass index is 33.96 kg/m.    ECOG FS:1 - Symptomatic but completely ambulatory  General: Well-developed, well-nourished, no acute distress.  Sitting in a wheelchair. Eyes: Pink conjunctiva, anicteric sclera. HEENT: Normocephalic, moist mucous membranes. Lungs: No audible wheezing or coughing. Heart: Regular rate and rhythm. Abdomen: Soft, nontender, no obvious distention. Musculoskeletal: No edema, cyanosis, or clubbing. Neuro: Alert, answering all questions appropriately. Cranial nerves grossly intact. Skin: No rashes or petechiae noted. Psych: Normal affect.  LAB RESULTS:  Lab Results  Component Value Date   NA 139 07/17/2019   K 3.3 (L) 07/17/2019   CL 105 07/17/2019   CO2 26 07/17/2019   GLUCOSE 132 (H) 07/17/2019   BUN 12 07/17/2019   CREATININE 1.03 (H) 07/17/2019   CALCIUM 9.3 07/17/2019   PROT 7.1 07/17/2019   ALBUMIN 3.4 (L) 07/17/2019   AST 27 07/17/2019   ALT 18 07/17/2019   ALKPHOS 63 07/17/2019   BILITOT 0.6 07/17/2019   GFRNONAA 56 (L) 07/17/2019   GFRAA >60  07/17/2019    Lab Results  Component Value Date   WBC 4.4 07/17/2019   NEUTROABS  2.1 07/17/2019   HGB 9.0 (L) 07/17/2019   HCT 27.3 (L) 07/17/2019   MCV 90.7 07/17/2019   PLT 206 07/17/2019     STUDIES: US Venous Img Upper Uni Right(DVT)  Result Date: 07/11/2019 CLINICAL DATA:  68 year old female with right IJ port catheter, with associated edema of the right upper extremity EXAM: RIGHT UPPER EXTREMITY VENOUS DOPPLER ULTRASOUND TECHNIQUE: Gray-scale sonography with graded compression, as well as color Doppler and duplex ultrasound were performed to evaluate the upper extremity deep venous system from the level of the subclavian vein and including the jugular, axillary, basilic, radial, ulnar and upper cephalic vein. Spectral Doppler was utilized to evaluate flow at rest and with distal augmentation maneuvers. COMPARISON:  None. FINDINGS: Contralateral Subclavian Vein: Respiratory phasicity is normal and symmetric with the symptomatic side. No evidence of thrombus. Normal compressibility. Internal Jugular Vein: Thrombosed right IJ vein, with the port catheter visualized in the IJ. Subclavian Vein: No evidence of thrombus. Normal compressibility, respiratory phasicity and response to augmentation. Axillary Vein: No evidence of thrombus. Normal compressibility, respiratory phasicity and response to augmentation. Cephalic Vein: No evidence of thrombus. Normal compressibility, respiratory phasicity and response to augmentation. Basilic Vein: No evidence of thrombus. Normal compressibility, respiratory phasicity and response to augmentation. Brachial Veins: No evidence of thrombus. Normal compressibility, respiratory phasicity and response to augmentation. Radial Veins: No evidence of thrombus. Normal compressibility, respiratory phasicity and response to augmentation. Ulnar Veins: No evidence of thrombus. Normal compressibility, respiratory phasicity and response to augmentation. Other Findings:  None  visualized. IMPRESSION: Sonographic survey demonstrates catheter associated thrombosis of the right internal jugular vein. Sonographic survey negative for right upper extremity DVT. Electronically Signed   By: Corrie Mckusick D.O.   On: 07/11/2019 13:20   MM 3D SCREEN BREAST BILATERAL  Result Date: 07/05/2019 CLINICAL DATA:  Screening. Patient has a history of what was diagnosed as metastatic breast carcinoma to a left axillary lymph node. She has been treated with chemotherapy. No breast malignancy was found either mammography, ultrasound or breast MRI. EXAM: DIGITAL SCREENING BILATERAL MAMMOGRAM WITH TOMO AND CAD COMPARISON:  Previous exam(s). ACR Breast Density Category c: The breast tissue is heterogeneously dense, which may obscure small masses. FINDINGS: There are no findings suspicious for malignancy. Images were processed with CAD. IMPRESSION: No mammographic evidence of malignancy. A result letter of this screening mammogram will be mailed directly to the patient. RECOMMENDATION: Screening mammogram in one year. (Code:SM-B-01Y) BI-RADS CATEGORY  1: Negative. Electronically Signed   By: Lajean Manes M.D.   On: 07/05/2019 10:22    ASSESSMENT: Stage IIa ER+ left breast cancer with no breast lesion and axillary lymph node metastasis. (TxN1M0)  HER-2 negative.  Now with stage IIIa colon cancer.  PLAN:   1.  Stage IIIa colon cancer: Pathology report reviewed independently.  CT scan of her abdomen on January 02, 2019 that did not reveal metastatic disease.  CT scan of the chest on February 07, 2019 also was negative for metastatic disease.  Patient agreed to enroll in clinical trial.  Oxaliplatin has been discontinued secondary to worsening neuropathy.  Proceed with cycle 11 of 5-FU plus Tecentriq today.  Return to clinic in 2 days for pump removal and IV fluids, 1 week for additional IV fluids, then in 2 weeks for further evaluation and consideration of cycle 12.  Once she completes cycle 12, patient will  continue Tecentriq every 3 weeks for an additional 6 months or a total of 12 months of treatment. 2. Stage IIa  ER+ left breast cancer with no breast lesion and axillary lymph node metastasis: Despite no obvious breast lesion on mammogram or breast MRI, pathology and pattern of spread was consistent with primary breast cancer. CT and bone scan at time of diagnosis revealed no other evidence of malignancy.  She only received 3 cycles of Adriamycin and Cytoxan. Taxol was discontinued early as well secondary to persistent peripheral neuropathy. Patient completed her chemotherapy on Nov 19, 2014.  She elected not to pursue axillary node dissection given the potential morbidity of this procedure. It was also elected not to pursue adjuvant XRT given there was no primary breast lesion.  Patient completed approximately 4 years of treatment with letrozole, but this has been discontinued secondary to enrolling in clinical trial for her newly diagnosed colon cancer.  Her most recent mammogram on July 04, 2019 was reported as BI-RADS 1.  Repeat in December 2021. 3.  Bone health: Patient's most recent bone mineral density on February 03, 2018 reported T score of -0.7 which is unchanged from 3 years prior.  Consider repeat bone mineral density in July 2021.  4.  Lynch syndrome: Genetic testing confirmed patient has Lynch syndrome.  She reports her sister and son both have tested positive.  She plans to talk to the rest of her children about genetic testing.  Appreciate genetics input. 5.  Endometrial cancer: Patient has had a total hysterectomy. 6.  Pain/Peripheral neuropathy: Grade 3. Discontinue oxaliplatin as above.  Agree with current medications from pain clinic.  Continue follow-up with them as indicated.   7. Anemia: Chronic and unchanged.  Hemoglobin is stable at 9.0. 8.  Thrombocytopenia: Resolved. 9.  Renal insufficiency: Resolved. 10.  Hypokalemia: Mild.  Continue oral potassium supplementation 20 mEq twice per  day. 11.  Leukopenia: Resolved. 12.  Diarrhea: Chronic and unchanged.  Continue Imodium as needed. 13.  Fatigue: Improved.  Patient has no new medications, she is anemic but this is relatively unchanged.  Thyroid studies are within normal limits.  She states her sleep habits are good.  Continue to monitor and consider a trial of dexamethasone or Remeron. 14.  Hypotension: Improved.  Patient has been instructed to continue to hold Norvasc.  IV fluids as above. 15.  Port associated clot: Continue Eliquis as prescribed for a total of 3 months completing treatment in March 2021.    Patient expressed understanding and was in agreement with this plan. She also understands that She can call clinic at any time with any questions, concerns, or complaints.   Breast cancer metastasized to axillary lymph node   Staging form: Breast, AJCC 7th Edition     Clinical stage from 10/22/2014: Stage Unknown (TX, N1, M0) - Signed by Carrie Huger, MD on 10/22/2014   Carrie Huger, MD   07/17/2019 9:24 AM

## 2019-07-13 ENCOUNTER — Inpatient Hospital Stay: Payer: Medicare Other

## 2019-07-16 DIAGNOSIS — D010 Carcinoma in situ of colon: Secondary | ICD-10-CM | POA: Diagnosis not present

## 2019-07-17 ENCOUNTER — Inpatient Hospital Stay (HOSPITAL_BASED_OUTPATIENT_CLINIC_OR_DEPARTMENT_OTHER): Payer: Medicare HMO | Admitting: Oncology

## 2019-07-17 ENCOUNTER — Other Ambulatory Visit: Payer: Self-pay

## 2019-07-17 ENCOUNTER — Inpatient Hospital Stay: Payer: Medicare HMO | Attending: Oncology

## 2019-07-17 ENCOUNTER — Encounter: Payer: Self-pay | Admitting: Oncology

## 2019-07-17 ENCOUNTER — Inpatient Hospital Stay: Payer: Medicare HMO

## 2019-07-17 ENCOUNTER — Encounter: Payer: Self-pay | Admitting: *Deleted

## 2019-07-17 VITALS — BP 125/89 | HR 75 | Temp 97.3°F | Resp 18

## 2019-07-17 VITALS — BP 106/79 | HR 86 | Temp 97.1°F | Resp 18 | Wt 191.7 lb

## 2019-07-17 DIAGNOSIS — Z9221 Personal history of antineoplastic chemotherapy: Secondary | ICD-10-CM | POA: Insufficient documentation

## 2019-07-17 DIAGNOSIS — Z006 Encounter for examination for normal comparison and control in clinical research program: Secondary | ICD-10-CM

## 2019-07-17 DIAGNOSIS — R5383 Other fatigue: Secondary | ICD-10-CM

## 2019-07-17 DIAGNOSIS — R197 Diarrhea, unspecified: Secondary | ICD-10-CM | POA: Insufficient documentation

## 2019-07-17 DIAGNOSIS — Z86718 Personal history of other venous thrombosis and embolism: Secondary | ICD-10-CM | POA: Diagnosis not present

## 2019-07-17 DIAGNOSIS — Z1509 Genetic susceptibility to other malignant neoplasm: Secondary | ICD-10-CM | POA: Insufficient documentation

## 2019-07-17 DIAGNOSIS — D649 Anemia, unspecified: Secondary | ICD-10-CM | POA: Diagnosis not present

## 2019-07-17 DIAGNOSIS — Z87891 Personal history of nicotine dependence: Secondary | ICD-10-CM | POA: Insufficient documentation

## 2019-07-17 DIAGNOSIS — G620 Drug-induced polyneuropathy: Secondary | ICD-10-CM | POA: Diagnosis not present

## 2019-07-17 DIAGNOSIS — Z7901 Long term (current) use of anticoagulants: Secondary | ICD-10-CM | POA: Insufficient documentation

## 2019-07-17 DIAGNOSIS — Z803 Family history of malignant neoplasm of breast: Secondary | ICD-10-CM | POA: Insufficient documentation

## 2019-07-17 DIAGNOSIS — Z5112 Encounter for antineoplastic immunotherapy: Secondary | ICD-10-CM | POA: Diagnosis present

## 2019-07-17 DIAGNOSIS — C189 Malignant neoplasm of colon, unspecified: Secondary | ICD-10-CM

## 2019-07-17 DIAGNOSIS — C184 Malignant neoplasm of transverse colon: Secondary | ICD-10-CM

## 2019-07-17 DIAGNOSIS — Z808 Family history of malignant neoplasm of other organs or systems: Secondary | ICD-10-CM | POA: Insufficient documentation

## 2019-07-17 DIAGNOSIS — E876 Hypokalemia: Secondary | ICD-10-CM | POA: Insufficient documentation

## 2019-07-17 DIAGNOSIS — Z853 Personal history of malignant neoplasm of breast: Secondary | ICD-10-CM | POA: Diagnosis not present

## 2019-07-17 DIAGNOSIS — D010 Carcinoma in situ of colon: Secondary | ICD-10-CM | POA: Diagnosis not present

## 2019-07-17 DIAGNOSIS — Z800 Family history of malignant neoplasm of digestive organs: Secondary | ICD-10-CM | POA: Diagnosis not present

## 2019-07-17 DIAGNOSIS — Z8542 Personal history of malignant neoplasm of other parts of uterus: Secondary | ICD-10-CM | POA: Insufficient documentation

## 2019-07-17 DIAGNOSIS — Z90710 Acquired absence of both cervix and uterus: Secondary | ICD-10-CM | POA: Insufficient documentation

## 2019-07-17 DIAGNOSIS — I959 Hypotension, unspecified: Secondary | ICD-10-CM | POA: Diagnosis not present

## 2019-07-17 LAB — COMPREHENSIVE METABOLIC PANEL
ALT: 18 U/L (ref 0–44)
AST: 27 U/L (ref 15–41)
Albumin: 3.4 g/dL — ABNORMAL LOW (ref 3.5–5.0)
Alkaline Phosphatase: 63 U/L (ref 38–126)
Anion gap: 8 (ref 5–15)
BUN: 12 mg/dL (ref 8–23)
CO2: 26 mmol/L (ref 22–32)
Calcium: 9.3 mg/dL (ref 8.9–10.3)
Chloride: 105 mmol/L (ref 98–111)
Creatinine, Ser: 1.03 mg/dL — ABNORMAL HIGH (ref 0.44–1.00)
GFR calc Af Amer: >60 mL/min (ref >60)
GFR calc non Af Amer: 56 mL/min — ABNORMAL LOW (ref >60)
Glucose, Bld: 132 mg/dL — ABNORMAL HIGH (ref 70–99)
Potassium: 3.3 mmol/L — ABNORMAL LOW (ref 3.5–5.1)
Sodium: 139 mmol/L (ref 135–145)
Total Bilirubin: 0.6 mg/dL (ref 0.3–1.2)
Total Protein: 7.1 g/dL (ref 6.5–8.1)

## 2019-07-17 LAB — CBC WITH DIFFERENTIAL/PLATELET
Abs Immature Granulocytes: 0.09 10*3/uL — ABNORMAL HIGH (ref 0.00–0.07)
Basophils Absolute: 0 10*3/uL (ref 0.0–0.1)
Basophils Relative: 1 %
Eosinophils Absolute: 0.1 10*3/uL (ref 0.0–0.5)
Eosinophils Relative: 1 %
HCT: 27.3 % — ABNORMAL LOW (ref 36.0–46.0)
Hemoglobin: 9 g/dL — ABNORMAL LOW (ref 12.0–15.0)
Immature Granulocytes: 2 %
Lymphocytes Relative: 30 %
Lymphs Abs: 1.3 10*3/uL (ref 0.7–4.0)
MCH: 29.9 pg (ref 26.0–34.0)
MCHC: 33 g/dL (ref 30.0–36.0)
MCV: 90.7 fL (ref 80.0–100.0)
Monocytes Absolute: 0.8 10*3/uL (ref 0.1–1.0)
Monocytes Relative: 18 %
Neutro Abs: 2.1 10*3/uL (ref 1.7–7.7)
Neutrophils Relative %: 48 %
Platelets: 206 10*3/uL (ref 150–400)
RBC: 3.01 MIL/uL — ABNORMAL LOW (ref 3.87–5.11)
RDW: 16.9 % — ABNORMAL HIGH (ref 11.5–15.5)
WBC: 4.4 10*3/uL (ref 4.0–10.5)
nRBC: 0 % (ref 0.0–0.2)

## 2019-07-17 MED ORDER — LEUCOVORIN 350 MG INJECTION FOR ALLIANCE A021502
400.0000 mg/m2 | Freq: Once | INTRAVENOUS | Status: AC
  Administered 2019-07-17: 780 mg via INTRAVENOUS
  Filled 2019-07-17: qty 25

## 2019-07-17 MED ORDER — SODIUM CHLORIDE 0.9 % IV SOLN
2400.0000 mg/m2 | INTRAVENOUS | Status: DC
  Administered 2019-07-17: 4700 mg via INTRAVENOUS
  Filled 2019-07-17: qty 94

## 2019-07-17 MED ORDER — SODIUM CHLORIDE 0.9 % IV SOLN
840.0000 mg | Freq: Once | INTRAVENOUS | Status: AC
  Administered 2019-07-17: 840 mg via INTRAVENOUS
  Filled 2019-07-17: qty 14

## 2019-07-17 MED ORDER — SODIUM CHLORIDE 0.9 % IV SOLN
Freq: Once | INTRAVENOUS | Status: AC
  Filled 2019-07-17: qty 250

## 2019-07-17 MED ORDER — FLUOROURACIL CHEMO INJECTION 2.5 GM/50ML ALLIANCE A021502
400.0000 mg/m2 | Freq: Once | INTRAVENOUS | Status: AC
  Administered 2019-07-17: 800 mg via INTRAVENOUS
  Filled 2019-07-17: qty 16

## 2019-07-17 MED ORDER — DEXTROSE 5 % IV SOLN
Freq: Once | INTRAVENOUS | Status: DC
  Filled 2019-07-17: qty 250

## 2019-07-17 MED ORDER — SODIUM CHLORIDE 0.9% FLUSH
10.0000 mL | Freq: Once | INTRAVENOUS | Status: AC
  Administered 2019-07-17: 10 mL via INTRAVENOUS
  Filled 2019-07-17: qty 10

## 2019-07-17 MED ORDER — SODIUM CHLORIDE 0.9 % IV SOLN
Freq: Once | INTRAVENOUS | Status: AC
  Filled 2019-07-17: qty 5

## 2019-07-17 MED ORDER — HEPARIN SOD (PORK) LOCK FLUSH 100 UNIT/ML IV SOLN
500.0000 [IU] | Freq: Once | INTRAVENOUS | Status: DC
  Filled 2019-07-17: qty 5

## 2019-07-17 MED ORDER — PALONOSETRON HCL INJECTION 0.25 MG/5ML
0.2500 mg | Freq: Once | INTRAVENOUS | Status: AC
  Administered 2019-07-17: 0.25 mg via INTRAVENOUS
  Filled 2019-07-17: qty 5

## 2019-07-17 NOTE — Research (Signed)
Patient Carrie Alvarez returns to clinic again this morning for consideration her C11D1 treatment on the [id number] [name] study. Her treatment was held last week due to hypotension and a newly diagnosed port-a-cath thrombus. She report much improved but continued pain in the right side of her neck this morning and states she has been taking the Eliquis twice daily since prescribed last week. Patient reports weakness and drowsiness have improved and feeling a little better today, with B/P of 106/79. States her peripheral neuropathy is also better today, but still present in fingertips, toes and bottom of her left foot only - resolved in the right foot. Still reports it has helped her to not receive Oxaliplatin.  Local labs were collected via port-a-cath per protocol this morning and CBC and chemistry values were reviewed by Dr. Grayland Ormond. She remains anemic and platelets have recovered to normal. Temp 97.1 and SpO2 remains at 100% on room air this morning with patient wearing a mask. Patient denies any abdominal pain at present, but continues to experience occasional soreness at the incision site. States the pain is intermittant and does not interfere with her ADLs, but her fatigue remains significant and she reports not feeling like doing much of anything due to her level of fatigue; which is partially due to current chemotherapy and partially from baseline. Reports diarrhea has finally resolved and she currently has 1-2 BMs formed stools per day, but states she is still taking Imodium every morning. Solicited adverse events were assessed by this RN today, and she completed the PRO-CTCAE booklet after Dr. Grayland Ormond saw patient and decided to proceed with chemotherapy today. He instructed patient to continue to hold her Norvasc, and he will have her return to clinic in one week for B/P check and hydration fluids to hopefully help her make it to her final chemotherapy, and also instructed her again to increase her  PO fluid intake to help maintain hydration. Patient remains in a wheelchair while in clinic due to her ongoing issues with fatigue, but states she is ambulatory and uses a cane while at hom. She denies any further cold sensitivity since Oxaliplatin infusions discontinued. Tenderness in the palms of her hands and the bottoms of her feet continues, and her skin remains darker in these areas. No weight loss noted. Potassium level is down to 3.3 mg/dL - which is grade 1 and patient states she has not yet taken her potassium supplement today.  Serum creatinine remains slightly elevated today at at 1.03mg /dL, and CrCl calculation is 71.6mL/min. Platelet count is wnl now at 206,000. Patient's anemia remains at grade 2 with Hgb of 9.0g/dL following her last chemotherapy 3 weeks ago. H&P completed by Dr. Grayland Ormond and patient will receive C11 chemotherapy+Atezolizumab as scheduled today. Solicited and other Adverse events with grade and attribution are as noted below.   Adverse Event Log  Study/Protocol: [id number] [name] Cycle: Cycle 10 Event Grade Onset Date Resolved Date Drug Name Attribution Treatment Comments  Diarrhea 1 05/18/2019 01/10/2019 07/14/2019 05/02/2019 n/a surgery Imodium Lomotil Decreased to 1-2 stools/day now   Abdominal pain 2 04/08/2019 01/10/2019 04/12/2019 04/08/2019 n/a surgery Oxycodone Reports pain resolved  Constipation 0      Denies  Nausea 1 06/07/2019 06/14/2019    Denies further nausea  Fatigue 2 06/12/2018 approx   ?  Reports no energy in past 12 months  Anorexia 1 07/13/2008   ?  States this is her norm for several years  Cough 0        Dyspnea 0  Fever 1 06/07/2019 06/07/2019    Temp 99.6 in ED  UTI 0        Palmer-plantar erythrodesia 1 03/13/2019    otc cream Bilateral palms and anterior fingers discolored and tender  Total biliruben 0        Neutrophils 0        TSH 0      No history of hypothyroidism  Sensory Peripheral Neuropathy 3 06/13/2019 07/10/2019   Oxaliplatin Oxaliplatin discontinued Continuous, affecting ability to grasp objects & ambulation, ADLs  Sensory Peripheral Neuropathy 2 07/11/2019 11/19/2014  06/13/2019  Paclitaxel Plus Oxaliplatin gabapentin Tingling in fingertips and feet bil. since taking Taxol  Hypokalemia 1 07/17/2019 03/27/2019 02/27/2019  04/17/2019 03/13/2019  probable none K+ is 3.3mg /dL today  Anemia 1 05/01/2019 06/26/2019      Anemia 2 06/26/2019 02/27/2019  04/17/2019  probable none Hgb 9.0 on 07/10/19  Elevated creatinine 1 02/07/2019   unrelated  Elevated at baseline CrCl: 65.37mL/min  Platelet count   decreased 1 05/29/2019 03/27/2019 07/17/2019 04/17/2019  definitely none Plts  206,000 today  Possible Colitis 2 04/08/2019 04/12/2019  probable Steroids Resolved  Weight Loss 1 04/10/2019 04/17/2019  probable none Weight back to normal    Proteinuria 1 05/15/2019    none Urine dipstick only  Abdominal pain 1 05/22/2019   unlikely  Occasional incisional soreness  Hypokalemia 3 05/29/2019 06/26/2019  probable IV + oral K+ K+ is 2.9 mmol/L today  vomiting 1 06/06/2019 06/07/2019  unlikely  Resolved with IVFs  somnolence 2 06/05/2019 06/08/2019  unlikely    headache 2 06/05/2019 06/07/2019  unlikely  Had Toradol in ED  Port associated  thrombosis 3 07/10/2019   unlikely Eliquis Pt reported pain in right side of neck at port site  Hypotension 2 06/12/2019 07/17/2019  possible IV fluids Norvasc on hold  [name], BSN, MHA, OCN 07/17/2019 9:37 AM

## 2019-07-19 ENCOUNTER — Inpatient Hospital Stay: Payer: Medicare HMO

## 2019-07-19 ENCOUNTER — Other Ambulatory Visit: Payer: Self-pay

## 2019-07-19 VITALS — BP 121/83 | HR 73 | Temp 97.9°F | Resp 18

## 2019-07-19 DIAGNOSIS — C184 Malignant neoplasm of transverse colon: Secondary | ICD-10-CM

## 2019-07-19 DIAGNOSIS — C189 Malignant neoplasm of colon, unspecified: Secondary | ICD-10-CM | POA: Diagnosis not present

## 2019-07-19 MED ORDER — HEPARIN SOD (PORK) LOCK FLUSH 100 UNIT/ML IV SOLN
500.0000 [IU] | Freq: Once | INTRAVENOUS | Status: DC | PRN
  Filled 2019-07-19: qty 5

## 2019-07-19 MED ORDER — SODIUM CHLORIDE 0.9% FLUSH
10.0000 mL | INTRAVENOUS | Status: DC | PRN
  Administered 2019-07-19: 10 mL
  Filled 2019-07-19: qty 10

## 2019-07-19 MED ORDER — HEPARIN SOD (PORK) LOCK FLUSH 100 UNIT/ML IV SOLN
INTRAVENOUS | Status: AC
  Filled 2019-07-19: qty 5

## 2019-07-19 MED ORDER — HEPARIN SOD (PORK) LOCK FLUSH 100 UNIT/ML IV SOLN
500.0000 [IU] | Freq: Once | INTRAVENOUS | Status: AC | PRN
  Administered 2019-07-19: 500 [IU]
  Filled 2019-07-19: qty 5

## 2019-07-19 MED ORDER — SODIUM CHLORIDE 0.9 % IV SOLN
Freq: Once | INTRAVENOUS | Status: AC
  Administered 2019-07-19: 1000 mL via INTRAVENOUS
  Filled 2019-07-19: qty 250

## 2019-07-19 MED ORDER — SODIUM CHLORIDE 0.9% FLUSH
10.0000 mL | Freq: Once | INTRAVENOUS | Status: DC | PRN
  Filled 2019-07-19: qty 10

## 2019-07-21 ENCOUNTER — Telehealth: Payer: Self-pay | Admitting: *Deleted

## 2019-07-21 NOTE — Telephone Encounter (Signed)
Received t/c from patient Carrie Alvarez earlier this afternoon reporting she has a sore throat and that it hurts to swallow. Email messages sent to Drs. Annamary Rummage, and Jarrett Soho in Triage to return patient's call. Hassan Rowan attempted to contact the patient, but her calls went to voicemail. T/C made to Dr. Tasia Catchings, who is on call this evening and she will also try to contact the patient and determine whether she needs some medication for her new onset of sore throat. Informed Dr. Tasia Catchings that the patient just received 5FU and atezolizumab on Monday, and received IV fluids on Wednesday when she had her 5FU pump removed. [name], BSN, MHA, OCN 07/21/2019 4:43 PM

## 2019-07-24 ENCOUNTER — Inpatient Hospital Stay: Payer: Medicare HMO

## 2019-07-24 ENCOUNTER — Other Ambulatory Visit: Payer: Self-pay

## 2019-07-24 VITALS — BP 140/90 | HR 88 | Temp 97.0°F | Resp 18

## 2019-07-24 DIAGNOSIS — C189 Malignant neoplasm of colon, unspecified: Secondary | ICD-10-CM | POA: Diagnosis not present

## 2019-07-24 DIAGNOSIS — C184 Malignant neoplasm of transverse colon: Secondary | ICD-10-CM

## 2019-07-24 MED ORDER — HEPARIN SOD (PORK) LOCK FLUSH 100 UNIT/ML IV SOLN
500.0000 [IU] | Freq: Once | INTRAVENOUS | Status: AC
  Administered 2019-07-24: 500 [IU] via INTRAVENOUS
  Filled 2019-07-24: qty 5

## 2019-07-24 MED ORDER — HEPARIN SOD (PORK) LOCK FLUSH 100 UNIT/ML IV SOLN
INTRAVENOUS | Status: AC
  Filled 2019-07-24: qty 5

## 2019-07-24 MED ORDER — SODIUM CHLORIDE 0.9 % IV SOLN
Freq: Once | INTRAVENOUS | Status: AC
  Filled 2019-07-24: qty 250

## 2019-07-24 NOTE — Progress Notes (Signed)
Reviewed pt.'s venous ultrasound that was performed on 07/11/2019 with MD. Per MD okay to use port for treatment/fluids. Pt does not complain of any pain or discomfort around port area, blood return noted, and flushes with ease. RN will continue to monitor.   Tevis Conger [employer]

## 2019-07-28 NOTE — Progress Notes (Signed)
[place]  [phone]) [phone] [phone]) [phone]  ID: Carrie Alvarez OB: 04-05-1951  MR#: [id number]  ION#:[id number]  Patient Care Team: Paulene Floor as PCP - General (Physician Assistant) [name], MD as Consulting Physician (Oncology) [name], MD as Attending Physician (Pain Medicine) [name]., MD as Consulting Physician (Ophthalmology) [name], RN as Oncology Nurse Navigator  CHIEF COMPLAINT: Stage IIa ER+ left breast cancer with no breast lesion and axillary lymph node metastasis.  Now with stage IIIa colon cancer.  INTERVAL HISTORY: Patient returns to clinic today for further evaluation and consideration of cycle 12 of 5-FU plus Tecentriq.  She continues to have chronic weakness and fatigue.  Her peripheral neuropathy in her hands or feet are unchanged.  She continues to have back pain radiating down her leg. She has no other neurologic complaints.  She denies any recent fevers or illnesses.  She has no chest pain, shortness of breath, cough, or hemoptysis.  She denies any nausea, vomiting, or constipation.  She has no melena or hematochezia.  She has no urinary complaints.  Patient offers no further specific complaints today.  REVIEW OF SYSTEMS:   Review of Systems  Constitutional: Positive for malaise/fatigue. Negative for fever and weight loss.  Respiratory: Negative.  Negative for cough and shortness of breath.   Cardiovascular: Negative.  Negative for chest pain and leg swelling.  Gastrointestinal: Positive for diarrhea. Negative for abdominal pain, blood in stool, constipation, melena, nausea and vomiting.  Genitourinary: Negative.  Negative for dysuria.  Musculoskeletal: Positive for back pain. Negative for neck pain.  Skin: Negative.  Negative for rash.  Neurological: Positive for tingling, sensory change and weakness. Negative for dizziness, focal weakness and headaches.  Psychiatric/Behavioral:  Negative.  Negative for depression. The patient is not nervous/anxious.     As per HPI. Otherwise, a complete review of systems is negative.  PAST MEDICAL HISTORY: Past Medical History:  Diagnosis Date  . Allergy   . Anemia   . Back pain   . Breast cancer ([place]) 2015   LT LUMPECTOMY 12-2014 FOLLOWING CHEMO  . Carpal tunnel syndrome    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  . Cataract   . Colon cancer ([place]) 01/2019  . Depression   . Family history of breast cancer   . Family history of colon cancer   . GERD (gastroesophageal reflux disease)    problem with reflux during chemo  . History of methicillin resistant staphylococcus aureus (MRSA)   . Hypercholesteremia   . Hypertension   . Lumbosacral pain    sees pain management in [place]  . Obesity   . Personal history of chemotherapy 2016   left breast ca  . Personal history of chemotherapy 2020   colon ca  . Pinched nerve    left side, lumbar area  . Uterine cancer ([place]) 1994    PAST SURGICAL HISTORY: Past Surgical History:  Procedure Laterality Date  . ABDOMINAL HYSTERECTOMY  1994   UTERINE CA  . AXILLARY LYMPH NODE BIOPSY Left 12/18/2014   Procedure: AXILLARY LYMPH NODE BIOPSY/;  Surgeon: Robert Bellow, MD;  Location: [facility];  Service: General;  Laterality: Left;  . AXILLARY LYMPH NODE DISSECTION Left 12/18/2014   Procedure: AXILLARY LYMPH NODE DISSECTION;  Surgeon: Robert Bellow, MD;  Location: [facility];  Service: General;  Laterality: Left;  . BREAST BIOPSY Left 05/2014   CORE BX OF LN, METASTATIC ADENOCARCINOMA  . BREAST LUMPECTOMY Left 12/2014  CHEMO FIRST THEN SURGERY OF LN  . BREAST SURGERY     lymph node removal  . CHOLECYSTECTOMY N/A 04/19/2016   Procedure: LAPAROSCOPIC CHOLECYSTECTOMY WITH INTRAOPERATIVE CHOLANGIOGRAM;  Surgeon: Hubbard Robinson, MD;  Location: [facility];  Service: General;  Laterality: N/A;  . COLON RESECTION Right 01/10/2019   Procedure: HAND ASSISTED LAPAROSCOPIC RIGHT COLON  RESECTION;  Surgeon: Jules Husbands, MD;  Location: [facility];  Service: General;  Laterality: Right;  . COLONOSCOPY WITH PROPOFOL N/A 12/30/2018   Procedure: COLONOSCOPY WITH PROPOFOL;  Surgeon: Lucilla Lame, MD;  Location: [facility];  Service: Endoscopy;  Laterality: N/A;  . medial branch block  11/06/2015   lumbar facet Dr. Primus Bravo  . POLYPECTOMY N/A 12/30/2018   Procedure: POLYPECTOMY;  Surgeon: Lucilla Lame, MD;  Location: [facility];  Service: Endoscopy;  Laterality: N/A;  Clips placed at Hepatic Flexure Polyp (2) and Transverse Colon Polyp (4) removal sites  . PORTACATH PLACEMENT Right 02/02/2019   Procedure: INSERTION PORT-A-CATH;  Surgeon: Jules Husbands, MD;  Location: [facility];  Service: General;  Laterality: Right;  . SENTINEL NODE BIOPSY Left 12/18/2014   Procedure: SENTINEL NODE BIOPSY;  Surgeon: Robert Bellow, MD;  Location: [facility];  Service: General;  Laterality: Left;    FAMILY HISTORY Family History  Problem Relation Age of Onset  . Breast cancer Sister 54  . Colon cancer Sister   . Colon cancer Mother        dx 40s  . Hypertension Mother   . Asthma Mother   . Cancer Father        voice box removed  . Cancer Maternal Grandmother        unsure type  . Colon cancer Cousin   . Cancer Cousin        unsure type       ADVANCED DIRECTIVES:    HEALTH MAINTENANCE: Social History   Tobacco Use  . Smoking status: Former Smoker    Packs/day: 0.50    Types: Cigarettes    Quit date: 07/18/2014    Years since quitting: 5.0  . Smokeless tobacco: Never Used  Substance Use Topics  . Alcohol use: No    Alcohol/week: 0.0 standard drinks  . Drug use: No    No Known Allergies  Current Outpatient Medications  Medication Sig Dispense Refill  . acetaminophen (TYLENOL) 500 MG tablet Take 500 mg by mouth every 6 (six) hours as needed for mild pain or moderate pain.     Marland Kitchen albuterol (PROVENTIL HFA;VENTOLIN HFA) 108 (90 Base) MCG/ACT inhaler Inhale 2 puffs  into the lungs every 6 (six) hours as needed for wheezing or shortness of breath. 1 Inhaler 2  . amLODipine (NORVASC) 10 MG tablet Take 1 tablet by mouth once daily 90 tablet 0  . apixaban (ELIQUIS) 5 MG TABS tablet Take 1 tablet (5 mg total) by mouth 2 (two) times daily. 60 tablet 3  . atorvastatin (LIPITOR) 10 MG tablet TAKE 1 TABLET BY MOUTH ONCE DAILY IN THE MORNING 90 tablet 0  . baclofen (LIORESAL) 10 MG tablet Take 1 tablet (10 mg total) by mouth 2 (two) times daily. (Patient taking differently: Take 10 mg by mouth 3 (three) times daily as needed for muscle spasms. ) 30 each 0  . cholecalciferol (VITAMIN D) 1000 units tablet Take 1,000 Units by mouth daily.    . diphenoxylate-atropine (LOMOTIL) 2.5-0.025 MG tablet Take 1 tablet by mouth 4 (four) times daily as needed for diarrhea or loose  stools. 30 tablet 0  . fluticasone (FLONASE) 50 MCG/ACT nasal spray Use 2 spray(s) in each nostril once daily 16 g 0  . gabapentin (NEURONTIN) 300 MG capsule Take 300-600 mg by mouth 4 (four) times daily as needed (neuropathic pain).     Marland Kitchen lidocaine-prilocaine (EMLA) cream Apply to affected area once 30 g 3  . ondansetron (ZOFRAN ODT) 4 MG disintegrating tablet Take 1 tablet (4 mg total) by mouth every 8 (eight) hours as needed for nausea or vomiting. 20 tablet 0  . Oxycodone HCl 10 MG TABS LIMIT ONE HALF TO ONE TABLET BY MOUTH 3 TO 5 TIMES DAILY IF TOLERATED    . potassium chloride SA (KLOR-CON) 20 MEQ tablet Take 1 tablet (20 mEq total) by mouth 2 (two) times daily. 60 tablet 1  . predniSONE (DELTASONE) 50 MG tablet Take 1 tab daily for a total of 7 days. 7 tablet 0  . pyridoxine (B-6) 100 MG tablet Take 100 mg by mouth daily.    . sertraline (ZOLOFT) 100 MG tablet Take 2 tablets by mouth once daily 180 tablet 0  . vitamin B-12 (CYANOCOBALAMIN) 500 MCG tablet Take 500 mcg by mouth daily.     No current facility-administered medications for this visit.   Facility-Administered Medications Ordered in Other  Visits  Medication Dose Route Frequency Provider Last Rate Last Admin  . fluorouracil ALLIANCE [id number] (ADRUCIL) 4,700 mg in sodium chloride 0.9 % 56 mL chemo infusion  2,400 mg/m2 (Treatment Plan Recorded) Intravenous 1 day or 1 dose [name], MD   4,700 mg at 02/27/19 1448  . fluorouracil ALLIANCE [id number] (ADRUCIL) 4,700 mg in sodium chloride 0.9 % 56 mL chemo infusion  2,400 mg/m2 (Treatment Plan Recorded) Intravenous 1 day or 1 dose [name], MD      . fluorouracil ALLIANCE [phone] (ADRUCIL) chemo injection 800 mg  400 mg/m2 (Treatment Plan Recorded) Intravenous Once [name], MD      . heparin lock flush 100 unit/mL  500 Units Intravenous Once [name], MD      . heparin lock flush 100 unit/mL  500 Units Intravenous Once [name], MD      . heparin lock flush 100 unit/mL  500 Units Intracatheter Once PRN [name], MD      . leucovorin ALLIANCE [id number] 780 mg in dextrose 5 % 250 mL infusion  400 mg/m2 (Treatment Plan Recorded) Intravenous Once [name], MD      . sodium chloride flush (NS) 0.9 % injection 10 mL  10 mL Intravenous PRN [name], MD   10 mL at 02/27/19 0925    OBJECTIVE: Vitals:   07/31/19 0849  BP: 116/88  Pulse: 90  Resp: 16  Temp: (!) 96 F (35.6 C)  SpO2: 100%     Body mass index is 33.82 kg/m.    ECOG FS:1 - Symptomatic but completely ambulatory  General: Well-developed, well-nourished, no acute distress.  Sitting in a wheelchair.   Eyes: Pink conjunctiva, anicteric sclera. HEENT: Normocephalic, moist mucous membranes. Lungs: No audible wheezing or coughing. Heart: Regular rate and rhythm. Abdomen: Soft, nontender, no obvious distention. Musculoskeletal: No edema, cyanosis, or clubbing. Neuro: Alert, answering all questions appropriately. Cranial nerves grossly intact. Skin: No rashes or petechiae noted. Psych: Normal affect.   LAB RESULTS:  Lab Results  Component  Value Date   NA 140 07/31/2019   K 4.0 07/31/2019   CL 108 07/31/2019   CO2 24 07/31/2019  GLUCOSE 103 (H) 07/31/2019   BUN 15 07/31/2019   CREATININE 1.14 (H) 07/31/2019   CALCIUM 9.6 07/31/2019   PROT 7.1 07/31/2019   ALBUMIN 3.7 07/31/2019   AST 21 07/31/2019   ALT 13 07/31/2019   ALKPHOS 68 07/31/2019   BILITOT 0.6 07/31/2019   GFRNONAA 49 (L) 07/31/2019   GFRAA 57 (L) 07/31/2019    Lab Results  Component Value Date   WBC 5.4 07/31/2019   NEUTROABS 3.5 07/31/2019   HGB 9.7 (L) 07/31/2019   HCT 29.9 (L) 07/31/2019   MCV 91.2 07/31/2019   PLT 173 07/31/2019     STUDIES: US Venous Img Upper Uni Right(DVT)  Result Date: 07/11/2019 CLINICAL DATA:  69 year old female with right IJ port catheter, with associated edema of the right upper extremity EXAM: RIGHT UPPER EXTREMITY VENOUS DOPPLER ULTRASOUND TECHNIQUE: Gray-scale sonography with graded compression, as well as color Doppler and duplex ultrasound were performed to evaluate the upper extremity deep venous system from the level of the subclavian vein and including the jugular, axillary, basilic, radial, ulnar and upper cephalic vein. Spectral Doppler was utilized to evaluate flow at rest and with distal augmentation maneuvers. COMPARISON:  None. FINDINGS: Contralateral Subclavian Vein: Respiratory phasicity is normal and symmetric with the symptomatic side. No evidence of thrombus. Normal compressibility. Internal Jugular Vein: Thrombosed right IJ vein, with the port catheter visualized in the IJ. Subclavian Vein: No evidence of thrombus. Normal compressibility, respiratory phasicity and response to augmentation. Axillary Vein: No evidence of thrombus. Normal compressibility, respiratory phasicity and response to augmentation. Cephalic Vein: No evidence of thrombus. Normal compressibility, respiratory phasicity and response to augmentation. Basilic Vein: No evidence of thrombus. Normal compressibility, respiratory phasicity and  response to augmentation. Brachial Veins: No evidence of thrombus. Normal compressibility, respiratory phasicity and response to augmentation. Radial Veins: No evidence of thrombus. Normal compressibility, respiratory phasicity and response to augmentation. Ulnar Veins: No evidence of thrombus. Normal compressibility, respiratory phasicity and response to augmentation. Other Findings:  None visualized. IMPRESSION: Sonographic survey demonstrates catheter associated thrombosis of the right internal jugular vein. Sonographic survey negative for right upper extremity DVT. Electronically Signed   By: Corrie Mckusick D.O.   On: 07/11/2019 13:20   MM 3D SCREEN BREAST BILATERAL  Result Date: 07/05/2019 CLINICAL DATA:  Screening. Patient has a history of what was diagnosed as metastatic breast carcinoma to a left axillary lymph node. She has been treated with chemotherapy. No breast malignancy was found either mammography, ultrasound or breast MRI. EXAM: DIGITAL SCREENING BILATERAL MAMMOGRAM WITH TOMO AND CAD COMPARISON:  Previous exam(s). ACR Breast Density Category c: The breast tissue is heterogeneously dense, which may obscure small masses. FINDINGS: There are no findings suspicious for malignancy. Images were processed with CAD. IMPRESSION: No mammographic evidence of malignancy. A result letter of this screening mammogram will be mailed directly to the patient. RECOMMENDATION: Screening mammogram in one year. (Code:SM-B-01Y) BI-RADS CATEGORY  1: Negative. Electronically Signed   By: Lajean Manes M.D.   On: 07/05/2019 10:22    ASSESSMENT: Stage IIa ER+ left breast cancer with no breast lesion and axillary lymph node metastasis. (TxN1M0)  HER-2 negative.  Now with stage IIIa colon cancer.  PLAN:   1.  Stage IIIa colon cancer: Pathology report reviewed independently.  CT scan of her abdomen on January 02, 2019 that did not reveal metastatic disease.  CT scan of the chest on February 07, 2019 also was negative for  metastatic disease.  Patient agreed to enroll in clinical trial.  Oxaliplatin has been discontinued secondary to worsening neuropathy.  Proceed with cycle 12 of 5-FU plus Tecentriq today.  Return to clinic in 2 days for pump removal and then in 2 weeks for further evaluation and continuation of maintenance Tecentriq every 2 weeks for an additional 6 months.  2. Stage IIa ER+ left breast cancer with no breast lesion and axillary lymph node metastasis: Despite no obvious breast lesion on mammogram or breast MRI, pathology and pattern of spread was consistent with primary breast cancer. CT and bone scan at time of diagnosis revealed no other evidence of malignancy.  She only received 3 cycles of Adriamycin and Cytoxan. Taxol was discontinued early as well secondary to persistent peripheral neuropathy. Patient completed her chemotherapy on Nov 19, 2014.  She elected not to pursue axillary node dissection given the potential morbidity of this procedure. It was also elected not to pursue adjuvant XRT given there was no primary breast lesion.  Patient completed approximately 4 years of treatment with letrozole, but this has been discontinued secondary to enrolling in clinical trial for her newly diagnosed colon cancer.  Her most recent mammogram on July 04, 2019 was reported as BI-RADS 1.  Repeat in December 2021. 3.  Bone health: Patient's most recent bone mineral density on February 03, 2018 reported T score of -0.7 which is unchanged from 3 years prior.  Consider repeat bone mineral density in July 2021.  4.  Lynch syndrome: Genetic testing confirmed patient has Lynch syndrome.  She reports her sister and son both have tested positive.  She plans to talk to the rest of her children about genetic testing.  Appreciate genetics input. 5.  Endometrial cancer: Patient has had a total hysterectomy. 6.  Pain/Peripheral neuropathy: Grade 3.  Chronic and unchanged.  Discontinue oxaliplatin as above.  Agree with current  medications from pain clinic.  Continue follow-up with them as indicated.   7. Anemia: Chronic and unchanged.  Hemoglobin is stable at 9.0. 8.  Thrombocytopenia: Resolved. 9.  Renal insufficiency: Mild, monitor. 10.  Hypokalemia: Resolved.  Continue oral potassium supplementation 20 mEq twice per day. 11.  Leukopenia: Resolved. 12.  Diarrhea: Chronic and unchanged.  Continue Imodium as needed. 13.  Fatigue: Improved.  Patient has no new medications, she is anemic but this is relatively unchanged.  Thyroid studies are within normal limits.  She states her sleep habits are good.  Continue to monitor and consider a trial of dexamethasone or Remeron. 14.  Hypotension: Improved.  Continue to hold Norvasc. 15.  Port associated clot: Continue Eliquis as prescribed for a total of 3 months completing treatment in March 2021.    Patient expressed understanding and was in agreement with this plan. She also understands that She can call clinic at any time with any questions, concerns, or complaints.   Breast cancer metastasized to axillary lymph node   Staging form: Breast, AJCC 7th Edition     Clinical stage from 10/22/2014: Stage Unknown (TX, N1, M0) - Signed by [name], MD on 10/22/2014   [name], MD   07/31/2019 11:00 AM

## 2019-07-31 ENCOUNTER — Inpatient Hospital Stay: Payer: Medicare HMO

## 2019-07-31 ENCOUNTER — Encounter: Payer: Self-pay | Admitting: *Deleted

## 2019-07-31 ENCOUNTER — Other Ambulatory Visit: Payer: Self-pay | Admitting: Emergency Medicine

## 2019-07-31 ENCOUNTER — Other Ambulatory Visit: Payer: Self-pay

## 2019-07-31 ENCOUNTER — Inpatient Hospital Stay (HOSPITAL_BASED_OUTPATIENT_CLINIC_OR_DEPARTMENT_OTHER): Payer: Medicare HMO | Admitting: Oncology

## 2019-07-31 VITALS — BP 130/82 | HR 86 | Temp 97.3°F | Resp 18

## 2019-07-31 VITALS — BP 116/88 | HR 90 | Temp 96.0°F | Resp 16 | Wt 190.9 lb

## 2019-07-31 DIAGNOSIS — C189 Malignant neoplasm of colon, unspecified: Secondary | ICD-10-CM | POA: Diagnosis not present

## 2019-07-31 DIAGNOSIS — C184 Malignant neoplasm of transverse colon: Secondary | ICD-10-CM | POA: Diagnosis not present

## 2019-07-31 DIAGNOSIS — D010 Carcinoma in situ of colon: Secondary | ICD-10-CM | POA: Diagnosis not present

## 2019-07-31 DIAGNOSIS — Z006 Encounter for examination for normal comparison and control in clinical research program: Secondary | ICD-10-CM | POA: Diagnosis not present

## 2019-07-31 LAB — COMPREHENSIVE METABOLIC PANEL
ALT: 13 U/L (ref 0–44)
AST: 21 U/L (ref 15–41)
Albumin: 3.7 g/dL (ref 3.5–5.0)
Alkaline Phosphatase: 68 U/L (ref 38–126)
Anion gap: 8 (ref 5–15)
BUN: 15 mg/dL (ref 8–23)
CO2: 24 mmol/L (ref 22–32)
Calcium: 9.6 mg/dL (ref 8.9–10.3)
Chloride: 108 mmol/L (ref 98–111)
Creatinine, Ser: 1.14 mg/dL — ABNORMAL HIGH (ref 0.44–1.00)
GFR calc Af Amer: 57 mL/min — ABNORMAL LOW (ref >60)
GFR calc non Af Amer: 49 mL/min — ABNORMAL LOW (ref >60)
Glucose, Bld: 103 mg/dL — ABNORMAL HIGH (ref 70–99)
Potassium: 4 mmol/L (ref 3.5–5.1)
Sodium: 140 mmol/L (ref 135–145)
Total Bilirubin: 0.6 mg/dL (ref 0.3–1.2)
Total Protein: 7.1 g/dL (ref 6.5–8.1)

## 2019-07-31 LAB — CBC WITH DIFFERENTIAL/PLATELET
Abs Immature Granulocytes: 0.01 10*3/uL (ref 0.00–0.07)
Basophils Absolute: 0 10*3/uL (ref 0.0–0.1)
Basophils Relative: 1 %
Eosinophils Absolute: 0.1 10*3/uL (ref 0.0–0.5)
Eosinophils Relative: 1 %
HCT: 29.9 % — ABNORMAL LOW (ref 36.0–46.0)
Hemoglobin: 9.7 g/dL — ABNORMAL LOW (ref 12.0–15.0)
Immature Granulocytes: 0 %
Lymphocytes Relative: 25 %
Lymphs Abs: 1.3 10*3/uL (ref 0.7–4.0)
MCH: 29.6 pg (ref 26.0–34.0)
MCHC: 32.4 g/dL (ref 30.0–36.0)
MCV: 91.2 fL (ref 80.0–100.0)
Monocytes Absolute: 0.4 10*3/uL (ref 0.1–1.0)
Monocytes Relative: 8 %
Neutro Abs: 3.5 10*3/uL (ref 1.7–7.7)
Neutrophils Relative %: 65 %
Platelets: 173 10*3/uL (ref 150–400)
RBC: 3.28 MIL/uL — ABNORMAL LOW (ref 3.87–5.11)
RDW: 17.9 % — ABNORMAL HIGH (ref 11.5–15.5)
WBC: 5.4 10*3/uL (ref 4.0–10.5)
nRBC: 0 % (ref 0.0–0.2)

## 2019-07-31 MED ORDER — LEUCOVORIN 350 MG INJECTION FOR ALLIANCE A021502
400.0000 mg/m2 | Freq: Once | INTRAVENOUS | Status: AC
  Administered 2019-07-31: 780 mg via INTRAVENOUS
  Filled 2019-07-31: qty 39

## 2019-07-31 MED ORDER — PALONOSETRON HCL INJECTION 0.25 MG/5ML
0.2500 mg | Freq: Once | INTRAVENOUS | Status: AC
  Administered 2019-07-31: 0.25 mg via INTRAVENOUS
  Filled 2019-07-31: qty 5

## 2019-07-31 MED ORDER — FLUOROURACIL CHEMO INJECTION 2.5 GM/50ML ALLIANCE A021502
400.0000 mg/m2 | Freq: Once | INTRAVENOUS | Status: AC
  Administered 2019-07-31: 800 mg via INTRAVENOUS
  Filled 2019-07-31: qty 16

## 2019-07-31 MED ORDER — SODIUM CHLORIDE 0.9 % IV SOLN
840.0000 mg | Freq: Once | INTRAVENOUS | Status: AC
  Administered 2019-07-31: 840 mg via INTRAVENOUS
  Filled 2019-07-31: qty 14

## 2019-07-31 MED ORDER — HEPARIN SOD (PORK) LOCK FLUSH 100 UNIT/ML IV SOLN
500.0000 [IU] | Freq: Once | INTRAVENOUS | Status: DC | PRN
  Filled 2019-07-31: qty 5

## 2019-07-31 MED ORDER — SODIUM CHLORIDE 0.9 % IV SOLN
2400.0000 mg/m2 | INTRAVENOUS | Status: DC
  Administered 2019-07-31: 4700 mg via INTRAVENOUS
  Filled 2019-07-31: qty 94

## 2019-07-31 MED ORDER — SODIUM CHLORIDE 0.9 % IV SOLN
Freq: Once | INTRAVENOUS | Status: AC
  Filled 2019-07-31: qty 250

## 2019-07-31 MED ORDER — SODIUM CHLORIDE 0.9% FLUSH
10.0000 mL | Freq: Once | INTRAVENOUS | Status: AC
  Administered 2019-07-31: 10 mL via INTRAVENOUS
  Filled 2019-07-31: qty 10

## 2019-07-31 MED ORDER — SODIUM CHLORIDE 0.9 % IV SOLN
Freq: Once | INTRAVENOUS | Status: AC
  Filled 2019-07-31: qty 5

## 2019-07-31 NOTE — Research (Signed)
Patient Carrie Alvarez returns to clinic again this morning for consideration her C12D1 treatment on the [id number] [name] study. She questions Dr. Grayland Ormond about her "blood clot" but denies pain in the right side of her neck this morning and states she has been taking the Eliquis twice daily. Patient reports feeling fair today, with B/P improved at 116/88. Reports she is down to just one bowel movement per day now and is hoping the diarrhea has resolved completely. States her peripheral neuropathy is about the same - still experiencing numbness in her fingers & tingling in her toes bilaterally, with some lingering numbness in the bottom of her left foot. Local labs were collected via port-a-cath this morning and CBC and chemistry values were reviewed by Dr. Grayland Ormond. Patient questions when she can have the port removed since there is a clot there, but since she will receive the Atezolizumab for 6 more months, she will leave it in for a while longer to simplify. She remains mildly anemic and platelets have recovered to normal. Temp 96.0 and SpO2 remains at 100% on room air this morning with patient wearing a mask. Patient denies any abdominal pain at present, but continues to experience occasional soreness at the incision site. States the pain is intermittant and does not interfere with her ADLs, but her fatigue remains significant and she reports not feeling like doing much of anything due to her level of fatigue; which is partially due to current chemotherapy and partially from baseline. Solicited adverse events were assessed by this RN today, and she completed the PRO-CTCAE booklet after Dr. Grayland Ormond saw patient and determined she could proceed with her last 5Fu chemotherapy infusion today. He discussed patient's upcoming appointment with the pain clinic for an injection in her back and states it is OK for her to have this procedure as well. Patient reports left leg pain, which she rated as a 9/10 this morning  that is causing her a good deal of distress. Reports they have recommended surgery on her back, but she has declined this and prefers more conservative management of her pain. Patient remains in a wheelchair while in clinic due to her ongoing issues with leg pain and fatigue, but states she is ambulatory and uses a cane while at home. She denies any further cold sensitivity since Oxaliplatin infusions discontinued, but the tenderness in the palms of her hands and the bottoms of her feet continues, and her skin remains darker in these areas. No weight loss noted. Potassium level is up to 4.0 mg/dL - which is grade 1 and patient voices compliance to potassium supplements.  Serum creatinine level remains slightly higher today at at 1.14mg /dL, and CrCl calculation is 64.74mL/min. Platelet remains wnl at 173,000. Patient's anemia remains at grade 2 with an improved Hgb of 9.7g/dL following her last chemotherapy 2 weeks ago. H&P completed by Dr. Grayland Ormond and patient will receive C12 chemotherapy plus Atezolizumab as scheduled today. Solicited and other Adverse events with grade and attribution are as noted below.   Adverse Event Log  Study/Protocol: [id number] [name] Cycle: Cycle 11 Event Grade Onset Date Resolved Date Drug Name Attribution Treatment Comments  Diarrhea 1 05/18/2019 01/10/2019 07/14/2019 05/02/2019 n/a surgery Imodium Lomotil Decreased to 1-2 stools/day now   Abdominal pain 2 04/08/2019 01/10/2019 04/12/2019 04/08/2019 n/a surgery Oxycodone Reports pain resolved  Constipation 0      Denies  Nausea 1 06/07/2019 06/14/2019    Denies further nausea  Fatigue 2 06/12/2018 approx   ?  Reports no energy  in past 12 months  Anorexia 1 07/13/2008   ?  States this is her norm for several years  Cough 0        Dyspnea 0        Fever 1 06/07/2019 06/07/2019    Temp 99.6 in ED  UTI 0        Palmer-plantar erythrodesia 1 03/13/2019    otc cream Bilateral palms and anterior fingers discolored and tender   Total biliruben 0        Neutrophils 0        TSH 0      No history of hypothyroidism  Sensory Peripheral Neuropathy 3 06/13/2019 07/10/2019  Oxaliplatin Oxaliplatin discontinued Continuous, affecting ability to grasp objects & ambulation, ADLs  Sensory Peripheral Neuropathy 2 07/11/2019 11/19/2014  06/13/2019  Paclitaxel Plus Oxaliplatin gabapentin Tingling in fingertips and feet bil. since taking Taxol  Hypokalemia 1 07/17/2019 03/27/2019 02/27/2019 07/31/2019 04/17/2019 03/13/2019  probable none K+ is 4.0mg /dL today  Anemia 1 05/01/2019 06/26/2019      Anemia 2 06/26/2019 02/27/2019  04/17/2019  probable none Hgb 9.7 today  Elevated creatinine 1 02/07/2019   unrelated  Elevated at baseline CrCl: 64.73mL/min  Platelet count   decreased 1 05/29/2019 03/27/2019 07/17/2019 04/17/2019  definitely none Plts  173,000 today  Possible Colitis 2 04/08/2019 04/12/2019  probable Steroids Resolved  Weight Loss 1 04/10/2019 04/17/2019  probable none Weight back to normal    Proteinuria 1 05/15/2019    none Urine dipstick only  Abdominal pain 1 05/22/2019   unlikely  Occasional incisional soreness  Hypokalemia 3 05/29/2019 06/26/2019  probable IV + oral K+ K+ is 2.9 mmol/L today  vomiting 1 06/06/2019 06/07/2019  unlikely  Resolved with IVFs  somnolence 2 06/05/2019 06/08/2019  unlikely    headache 2 06/05/2019 06/07/2019  unlikely  Had Toradol in ED  Port associated  thrombosis 3 07/10/2019   unlikely Eliquis Pt reported pain in right side of neck at port site  Hypotension 2 06/12/2019 07/17/2019  possible IV fluids Norvasc on hold  Chronic back & left leg pain 2 07/13/2017   unrelated  Reports 9/10 on pain scale this morning  [name], BSN, [place], [facility] 07/31/2019 9:56 AM

## 2019-07-31 NOTE — Progress Notes (Signed)
Patient is here today for follow up she is doing well mentions bilateral leg pain. Questions about blood clot is it still there or gone?

## 2019-07-31 NOTE — Progress Notes (Signed)
asdf

## 2019-08-01 DIAGNOSIS — M869 Osteomyelitis, unspecified: Secondary | ICD-10-CM | POA: Diagnosis not present

## 2019-08-01 DIAGNOSIS — M169 Osteoarthritis of hip, unspecified: Secondary | ICD-10-CM | POA: Diagnosis not present

## 2019-08-01 DIAGNOSIS — M545 Low back pain: Secondary | ICD-10-CM | POA: Diagnosis not present

## 2019-08-01 DIAGNOSIS — M5136 Other intervertebral disc degeneration, lumbar region: Secondary | ICD-10-CM | POA: Diagnosis not present

## 2019-08-01 DIAGNOSIS — G894 Chronic pain syndrome: Secondary | ICD-10-CM | POA: Diagnosis not present

## 2019-08-01 LAB — THYROID PANEL WITH TSH
Free Thyroxine Index: 1.4 (ref 1.2–4.9)
T3 Uptake Ratio: 25 % (ref 24–39)
T4, Total: 5.4 ug/dL (ref 4.5–12.0)
TSH: 0.842 u[IU]/mL (ref 0.450–4.500)

## 2019-08-02 ENCOUNTER — Inpatient Hospital Stay: Payer: Medicare HMO

## 2019-08-02 ENCOUNTER — Other Ambulatory Visit: Payer: Self-pay

## 2019-08-02 DIAGNOSIS — C189 Malignant neoplasm of colon, unspecified: Secondary | ICD-10-CM | POA: Diagnosis not present

## 2019-08-02 DIAGNOSIS — C184 Malignant neoplasm of transverse colon: Secondary | ICD-10-CM

## 2019-08-02 MED ORDER — HEPARIN SOD (PORK) LOCK FLUSH 100 UNIT/ML IV SOLN
500.0000 [IU] | Freq: Once | INTRAVENOUS | Status: AC
  Administered 2019-08-02: 500 [IU] via INTRAVENOUS
  Filled 2019-08-02: qty 5

## 2019-08-02 MED ORDER — SODIUM CHLORIDE 0.9% FLUSH
10.0000 mL | Freq: Once | INTRAVENOUS | Status: AC
  Administered 2019-08-02: 10 mL via INTRAVENOUS
  Filled 2019-08-02: qty 10

## 2019-08-02 MED ORDER — HEPARIN SOD (PORK) LOCK FLUSH 100 UNIT/ML IV SOLN
INTRAVENOUS | Status: AC
  Filled 2019-08-02: qty 5

## 2019-08-03 DIAGNOSIS — M5416 Radiculopathy, lumbar region: Secondary | ICD-10-CM | POA: Diagnosis not present

## 2019-08-07 ENCOUNTER — Other Ambulatory Visit: Payer: Self-pay | Admitting: Oncology

## 2019-08-07 ENCOUNTER — Other Ambulatory Visit: Payer: Self-pay

## 2019-08-07 ENCOUNTER — Ambulatory Visit
Admission: RE | Admit: 2019-08-07 | Discharge: 2019-08-07 | Disposition: A | Discharge status: Home / Self Care | Payer: Medicare HMO | Source: Ambulatory Visit | Attending: Oncology | Admitting: Oncology

## 2019-08-07 DIAGNOSIS — C184 Malignant neoplasm of transverse colon: Secondary | ICD-10-CM

## 2019-08-07 DIAGNOSIS — C189 Malignant neoplasm of colon, unspecified: Secondary | ICD-10-CM | POA: Diagnosis not present

## 2019-08-07 MED ORDER — IOHEXOL 300 MG/ML  SOLN
100.0000 mL | Freq: Once | INTRAMUSCULAR | Status: AC | PRN
  Administered 2019-08-07: 100 mL via INTRAVENOUS

## 2019-08-08 ENCOUNTER — Other Ambulatory Visit: Payer: Self-pay | Admitting: Oncology

## 2019-08-11 NOTE — Progress Notes (Signed)
[facility]  [phone] [phone]) [phone]  ID: Norm Salt OB: 22-May-1951  MR#: [id number]  SVX#:[id number]  Patient Care Team: Paulene Floor as PCP - General (Physician Assistant) [name], MD as Consulting Physician (Oncology) [name], MD as Attending Physician (Pain Medicine) [name]., MD as Consulting Physician (Ophthalmology) [name], RN as Oncology Nurse Navigator  CHIEF COMPLAINT: Stage IIa ER+ left breast cancer with no breast lesion and axillary lymph node metastasis.  Now with stage IIIa colon cancer.  INTERVAL HISTORY: Patient returns to clinic today for further evaluation and consideration of maintenance Tecentriq.  She continues to have chronic weakness and fatigue that is essentially unchanged.  Her peripheral neuropathy has been worse over the past several days, but she has also been without power and heat over the same timeframe.  Her back pain is unchanged. She has no other neurologic complaints.  She denies any recent fevers or illnesses.  She has no chest pain, shortness of breath, cough, or hemoptysis.  She denies any nausea, vomiting, or constipation.  She has no melena or hematochezia.  She has no urinary complaints.  Patient offers no further specific complaints today.    REVIEW OF SYSTEMS:   Review of Systems  Constitutional: Positive for malaise/fatigue. Negative for fever and weight loss.  Respiratory: Negative.  Negative for cough and shortness of breath.   Cardiovascular: Negative.  Negative for chest pain and leg swelling.  Gastrointestinal: Negative.  Negative for abdominal pain, blood in stool, constipation, diarrhea, melena, nausea and vomiting.  Genitourinary: Negative.  Negative for dysuria.  Musculoskeletal: Positive for back pain. Negative for neck pain.  Skin: Negative.  Negative for rash.  Neurological: Positive for tingling, sensory change and weakness. Negative  for dizziness, focal weakness and headaches.  Psychiatric/Behavioral: Negative.  Negative for depression. The patient is not nervous/anxious.     As per HPI. Otherwise, a complete review of systems is negative.  PAST MEDICAL HISTORY: Past Medical History:  Diagnosis Date  . Allergy   . Anemia   . Back pain   . Breast cancer ([place]) 2015   LT LUMPECTOMY 12-2014 FOLLOWING CHEMO  . Carpal tunnel syndrome    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  . Cataract   . Colon cancer ([place]) 01/2019  . Depression   . Family history of breast cancer   . Family history of colon cancer   . GERD (gastroesophageal reflux disease)    problem with reflux during chemo  . History of methicillin resistant staphylococcus aureus (MRSA)   . Hypercholesteremia   . Hypertension   . Lumbosacral pain    sees pain management in [place]  . Obesity   . Personal history of chemotherapy 2016   left breast ca  . Personal history of chemotherapy 2020   colon ca  . Pinched nerve    left side, lumbar area  . Uterine cancer ([place]) 1994    PAST SURGICAL HISTORY: Past Surgical History:  Procedure Laterality Date  . ABDOMINAL HYSTERECTOMY  1994   UTERINE CA  . AXILLARY LYMPH NODE BIOPSY Left 12/18/2014   Procedure: AXILLARY LYMPH NODE BIOPSY/;  Surgeon: Robert Bellow, MD;  Location: [facility];  Service: General;  Laterality: Left;  . AXILLARY LYMPH NODE DISSECTION Left 12/18/2014   Procedure: AXILLARY LYMPH NODE DISSECTION;  Surgeon: Robert Bellow, MD;  Location: [facility];  Service: General;  Laterality: Left;  . BREAST BIOPSY Left 05/2014   CORE  BX OF LN, METASTATIC ADENOCARCINOMA  . BREAST LUMPECTOMY Left 12/2014   CHEMO FIRST THEN SURGERY OF LN  . BREAST SURGERY     lymph node removal  . CHOLECYSTECTOMY N/A 04/19/2016   Procedure: LAPAROSCOPIC CHOLECYSTECTOMY WITH INTRAOPERATIVE CHOLANGIOGRAM;  Surgeon: Hubbard Robinson, MD;  Location: [facility];  Service: General;  Laterality: N/A;  . COLON RESECTION  Right 01/10/2019   Procedure: HAND ASSISTED LAPAROSCOPIC RIGHT COLON RESECTION;  Surgeon: Jules Husbands, MD;  Location: [facility];  Service: General;  Laterality: Right;  . COLONOSCOPY WITH PROPOFOL N/A 12/30/2018   Procedure: COLONOSCOPY WITH PROPOFOL;  Surgeon: Lucilla Lame, MD;  Location: [facility];  Service: Endoscopy;  Laterality: N/A;  . medial branch block  11/06/2015   lumbar facet Dr. Primus Bravo  . POLYPECTOMY N/A 12/30/2018   Procedure: POLYPECTOMY;  Surgeon: Lucilla Lame, MD;  Location: [facility];  Service: Endoscopy;  Laterality: N/A;  Clips placed at Hepatic Flexure Polyp (2) and Transverse Colon Polyp (4) removal sites  . PORTACATH PLACEMENT Right 02/02/2019   Procedure: INSERTION PORT-A-CATH;  Surgeon: Jules Husbands, MD;  Location: [facility];  Service: General;  Laterality: Right;  . SENTINEL NODE BIOPSY Left 12/18/2014   Procedure: SENTINEL NODE BIOPSY;  Surgeon: Robert Bellow, MD;  Location: [facility];  Service: General;  Laterality: Left;    FAMILY HISTORY Family History  Problem Relation Age of Onset  . Breast cancer Sister 81  . Colon cancer Sister   . Colon cancer Mother        dx 33s  . Hypertension Mother   . Asthma Mother   . Cancer Father        voice box removed  . Cancer Maternal Grandmother        unsure type  . Colon cancer Cousin   . Cancer Cousin        unsure type       ADVANCED DIRECTIVES:    HEALTH MAINTENANCE: Social History   Tobacco Use  . Smoking status: Former Smoker    Packs/day: 0.50    Types: Cigarettes    Quit date: 07/18/2014    Years since quitting: 5.0  . Smokeless tobacco: Never Used  Substance Use Topics  . Alcohol use: No    Alcohol/week: 0.0 standard drinks  . Drug use: No    No Known Allergies  Current Outpatient Medications  Medication Sig Dispense Refill  . acetaminophen (TYLENOL) 500 MG tablet Take 500 mg by mouth every 6 (six) hours as needed for mild pain or moderate pain.     Marland Kitchen albuterol  (PROVENTIL HFA;VENTOLIN HFA) 108 (90 Base) MCG/ACT inhaler Inhale 2 puffs into the lungs every 6 (six) hours as needed for wheezing or shortness of breath. 1 Inhaler 2  . apixaban (ELIQUIS) 5 MG TABS tablet Take 1 tablet (5 mg total) by mouth 2 (two) times daily. 60 tablet 3  . atorvastatin (LIPITOR) 10 MG tablet TAKE 1 TABLET BY MOUTH ONCE DAILY IN THE MORNING 90 tablet 0  . baclofen (LIORESAL) 10 MG tablet Take 1 tablet (10 mg total) by mouth 2 (two) times daily. (Patient taking differently: Take 10 mg by mouth 3 (three) times daily as needed for muscle spasms. ) 30 each 0  . cholecalciferol (VITAMIN D) 1000 units tablet Take 1,000 Units by mouth daily.    . diphenoxylate-atropine (LOMOTIL) 2.5-0.025 MG tablet Take 1 tablet by mouth 4 (four) times daily as needed for diarrhea or loose stools. 30 tablet 0  .  fluticasone (FLONASE) 50 MCG/ACT nasal spray Use 2 spray(s) in each nostril once daily 16 g 0  . gabapentin (NEURONTIN) 300 MG capsule Take 300-600 mg by mouth 4 (four) times daily as needed (neuropathic pain).     Marland Kitchen lidocaine-prilocaine (EMLA) cream Apply to affected area once 30 g 3  . Oxycodone HCl 10 MG TABS LIMIT ONE HALF TO ONE TABLET BY MOUTH 3 TO 5 TIMES DAILY IF TOLERATED    . pyridoxine (B-6) 100 MG tablet Take 100 mg by mouth daily.    . sertraline (ZOLOFT) 100 MG tablet Take 2 tablets by mouth once daily 180 tablet 0  . vitamin B-12 (CYANOCOBALAMIN) 500 MCG tablet Take 500 mcg by mouth daily.    Marland Kitchen amLODipine (NORVASC) 10 MG tablet Take 1 tablet by mouth once daily (Patient not taking: Reported on 08/11/2019) 90 tablet 0  . ondansetron (ZOFRAN ODT) 4 MG disintegrating tablet Take 1 tablet (4 mg total) by mouth every 8 (eight) hours as needed for nausea or vomiting. (Patient not taking: Reported on 08/11/2019) 20 tablet 0  . potassium chloride SA (KLOR-CON) 20 MEQ tablet Take 1 tablet (20 mEq total) by mouth 2 (two) times daily. (Patient not taking: Reported on 08/11/2019) 60 tablet 1  .  predniSONE (DELTASONE) 50 MG tablet Take 1 tab daily for a total of 7 days. (Patient not taking: Reported on 08/11/2019) 7 tablet 0   No current facility-administered medications for this visit.   Facility-Administered Medications Ordered in Other Visits  Medication Dose Route Frequency Provider Last Rate Last Admin  . heparin lock flush 100 unit/mL  500 Units Intravenous Once [name], MD      . heparin lock flush 100 unit/mL  500 Units Intravenous Once [name], MD      . sodium chloride flush (NS) 0.9 % injection 10 mL  10 mL Intravenous PRN [name], MD   10 mL at 02/27/19 0925    OBJECTIVE: Vitals:   08/14/19 1031  BP: 114/88  Pulse: 91  Resp: 18  Temp: (!) 96.1 F (35.6 C)     Body mass index is 35.43 kg/m.    ECOG FS:1 - Symptomatic but completely ambulatory  General: Well-developed, well-nourished, no acute distress.  Sitting in a wheelchair. Eyes: Pink conjunctiva, anicteric sclera. HEENT: Normocephalic, moist mucous membranes. Lungs: No audible wheezing or coughing. Heart: Regular rate and rhythm. Abdomen: Soft, nontender, no obvious distention. Musculoskeletal: No edema, cyanosis, or clubbing. Neuro: Alert, answering all questions appropriately. Cranial nerves grossly intact. Skin: No rashes or petechiae noted. Psych: Normal affect.  LAB RESULTS:  Lab Results  Component Value Date   NA 142 08/14/2019   K 3.4 (L) 08/14/2019   CL 109 08/14/2019   CO2 25 08/14/2019   GLUCOSE 121 (H) 08/14/2019   BUN 10 08/14/2019   CREATININE 0.91 08/14/2019   CALCIUM 9.1 08/14/2019   PROT 6.3 (L) 08/14/2019   ALBUMIN 3.3 (L) 08/14/2019   AST 21 08/14/2019   ALT 16 08/14/2019   ALKPHOS 67 08/14/2019   BILITOT 0.5 08/14/2019   GFRNONAA >60 08/14/2019   GFRAA >60 08/14/2019    Lab Results  Component Value Date   WBC 5.9 08/14/2019   NEUTROABS 3.9 08/14/2019   HGB 9.7 (L) 08/14/2019   HCT 29.1 (L) 08/14/2019   MCV 88.4 08/14/2019   PLT  165 08/14/2019     STUDIES: CT Chest W Contrast  Result Date: 08/07/2019 CLINICAL DATA:  Restaging colon cancer. Initial  diagnosis 6 months ago. History of surgery, radiation and chemotherapy. Patient also has a prior history of uterine cancer and breast cancer. EXAM: CT CHEST, ABDOMEN, AND PELVIS WITH CONTRAST TECHNIQUE: Multidetector CT imaging of the chest, abdomen and pelvis was performed following the standard protocol during bolus administration of intravenous contrast. CONTRAST:  121m OMNIPAQUE IOHEXOL 300 MG/ML  SOLN COMPARISON:  02/07/2019 chest CT and 01/02/2019 abdominal CT. FINDINGS: CT CHEST FINDINGS Cardiovascular: Stable mild fusiform aneurysmal dilatation of the ascending aorta with maximum diameter of 4.0 cm. No dissection. The branch vessels are patent. Minimal scattered atherosclerotic calcifications at the aortic arch. No obvious coronary artery calcifications. The pulmonary arteries appear normal. Mediastinum/Nodes: No mediastinal or hilar mass or adenopathy. The esophagus is grossly normal. There is a right-sided Port-A-Cath in place. There is new clot around the catheter in the right internal jugular vein. Lungs/Pleura: The lungs are clear of an acute process. Linear scarring type changes in the left upper lobe with calcification. No worrisome pulmonary lesions and no pulmonary nodules to suggest metastatic disease. No pleural effusion or pleural lesions. Musculoskeletal: No breast masses, supraclavicular or axillary adenopathy. No significant bony findings. CT ABDOMEN PELVIS FINDINGS Hepatobiliary: No focal hepatic lesions to suggest hepatic metastatic disease. The gallbladder is surgically absent. No intra or extrahepatic biliary dilatation. The portal and hepatic veins appear normal. Pancreas: No mass, inflammation or ductal dilatation. Spleen: Normal size.  No focal lesions. Adrenals/Urinary Tract: The adrenal glands and kidneys are unremarkable. The bladder appears normal.  Stomach/Bowel: The stomach, duodenum, small bowel and colon are unremarkable. Stable surgical changes from a right hemicolectomy. No findings for recurrent tumor at the anastomosis site. Vascular/Lymphatic: Stable scattered aortic and iliac artery calcifications but no aneurysm or dissection. The branch vessels are patent. The major venous structures are patent. No mesenteric or retroperitoneal lymphadenopathy. No omental or peritoneal surface lesions are identified. Reproductive: Surgically absent. Other: No pelvic mass or adenopathy. No free pelvic fluid collections. No inguinal mass or adenopathy. No abdominal wall hernia or subcutaneous lesions. Musculoskeletal: No significant bony findings. IMPRESSION: 1. Status post right hemicolectomy. No findings to suggest recurrent tumor, locoregional adenopathy or distant metastatic disease. 2. Stable tortuosity, ectasia and fusiform aneurysmal dilatation of the ascending thoracic aorta. 3. Suspect new clot around the Port-A-Cath in the right internal jugular vein. 4. Status post cholecystectomy.  No biliary dilatation. Electronically Signed   By: PMarijo SanesM.D.   On: 08/07/2019 10:08   CT Abdomen Pelvis W Contrast  Result Date: 08/07/2019 CLINICAL DATA:  Restaging colon cancer. Initial diagnosis 6 months ago. History of surgery, radiation and chemotherapy. Patient also has a prior history of uterine cancer and breast cancer. EXAM: CT CHEST, ABDOMEN, AND PELVIS WITH CONTRAST TECHNIQUE: Multidetector CT imaging of the chest, abdomen and pelvis was performed following the standard protocol during bolus administration of intravenous contrast. CONTRAST:  1030mOMNIPAQUE IOHEXOL 300 MG/ML  SOLN COMPARISON:  02/07/2019 chest CT and 01/02/2019 abdominal CT. FINDINGS: CT CHEST FINDINGS Cardiovascular: Stable mild fusiform aneurysmal dilatation of the ascending aorta with maximum diameter of 4.0 cm. No dissection. The branch vessels are patent. Minimal scattered  atherosclerotic calcifications at the aortic arch. No obvious coronary artery calcifications. The pulmonary arteries appear normal. Mediastinum/Nodes: No mediastinal or hilar mass or adenopathy. The esophagus is grossly normal. There is a right-sided Port-A-Cath in place. There is new clot around the catheter in the right internal jugular vein. Lungs/Pleura: The lungs are clear of an acute process. Linear scarring type changes in the  left upper lobe with calcification. No worrisome pulmonary lesions and no pulmonary nodules to suggest metastatic disease. No pleural effusion or pleural lesions. Musculoskeletal: No breast masses, supraclavicular or axillary adenopathy. No significant bony findings. CT ABDOMEN PELVIS FINDINGS Hepatobiliary: No focal hepatic lesions to suggest hepatic metastatic disease. The gallbladder is surgically absent. No intra or extrahepatic biliary dilatation. The portal and hepatic veins appear normal. Pancreas: No mass, inflammation or ductal dilatation. Spleen: Normal size.  No focal lesions. Adrenals/Urinary Tract: The adrenal glands and kidneys are unremarkable. The bladder appears normal. Stomach/Bowel: The stomach, duodenum, small bowel and colon are unremarkable. Stable surgical changes from a right hemicolectomy. No findings for recurrent tumor at the anastomosis site. Vascular/Lymphatic: Stable scattered aortic and iliac artery calcifications but no aneurysm or dissection. The branch vessels are patent. The major venous structures are patent. No mesenteric or retroperitoneal lymphadenopathy. No omental or peritoneal surface lesions are identified. Reproductive: Surgically absent. Other: No pelvic mass or adenopathy. No free pelvic fluid collections. No inguinal mass or adenopathy. No abdominal wall hernia or subcutaneous lesions. Musculoskeletal: No significant bony findings. IMPRESSION: 1. Status post right hemicolectomy. No findings to suggest recurrent tumor, locoregional  adenopathy or distant metastatic disease. 2. Stable tortuosity, ectasia and fusiform aneurysmal dilatation of the ascending thoracic aorta. 3. Suspect new clot around the Port-A-Cath in the right internal jugular vein. 4. Status post cholecystectomy.  No biliary dilatation. Electronically Signed   By: Marijo Sanes M.D.   On: 08/07/2019 10:08    ASSESSMENT: Stage IIa ER+ left breast cancer with no breast lesion and axillary lymph node metastasis. (TxN1M0)  HER-2 negative.  Now with stage IIIa colon cancer.  PLAN:   1.  Stage IIIa colon cancer: Pathology report reviewed independently.  CT scan of her abdomen on January 02, 2019 that did not reveal metastatic disease.  CT scan of the chest on February 07, 2019 also was negative for metastatic disease.  Patient agreed to enroll in clinical trial.  Oxaliplatin has been discontinued secondary to worsening neuropathy.  Patient completed 12 cycles of chemotherapy on July 31, 2019.  Continue with maintenance Tecentriq for an additional 6 months.  Return to clinic in 2 weeks for further evaluation and continuation of treatment. 2. Stage IIa ER+ left breast cancer with no breast lesion and axillary lymph node metastasis: Despite no obvious breast lesion on mammogram or breast MRI, pathology and pattern of spread was consistent with primary breast cancer. CT and bone scan at time of diagnosis revealed no other evidence of malignancy.  She only received 3 cycles of Adriamycin and Cytoxan. Taxol was discontinued early as well secondary to persistent peripheral neuropathy. Patient completed her chemotherapy on Nov 19, 2014.  She elected not to pursue axillary node dissection given the potential morbidity of this procedure. It was also elected not to pursue adjuvant XRT given there was no primary breast lesion.  Patient completed approximately 4 years of treatment with letrozole, but this has been discontinued secondary to enrolling in clinical trial for her newly diagnosed  colon cancer.  Her most recent mammogram on July 04, 2019 was reported as BI-RADS 1.  Repeat in December 2021. 3.  Bone health: Patient's most recent bone mineral density on February 03, 2018 reported T score of -0.7 which is unchanged from 3 years prior.  Consider repeat bone mineral density in July 2021.  4.  Lynch syndrome: Genetic testing confirmed patient has Lynch syndrome.  She reports her sister and son both have tested positive.  She plans to talk to the rest of her children about genetic testing.  Appreciate genetics input. 5.  Endometrial cancer: Patient has had a total hysterectomy. 6.  Pain/Peripheral neuropathy: Grade 3.  Slightly worse secondary to lack of heat in patient's house.  Monitor.  Agree with current medications from pain clinic.  Continue follow-up with them as indicated.   7. Anemia: Chronic and unchanged.  Hemoglobin has improved to 9.7. 8.  Thrombocytopenia: Resolved. 9.  Renal insufficiency: Mild, monitor. 10.  Hypokalemia: Resolved.  Continue oral potassium supplementation 20 mEq twice per day. 11.  Leukopenia: Resolved. 12.  Diarrhea: Patient does not complain of this today.  Continue Imodium as needed. 13.  Fatigue: Chronic and unchanged.  Patient has no new medications, she is anemic but this is relatively unchanged.  Thyroid studies are within normal limits.  She states her sleep habits are good.  Continue to monitor and consider a trial of dexamethasone or Remeron. 14.  Hypotension: Improved.  Continue to hold Norvasc. 15.  Port associated clot: Continue Eliquis as prescribed for a total of 3 months completing treatment in March 2021.    Patient expressed understanding and was in agreement with this plan. She also understands that She can call clinic at any time with any questions, concerns, or complaints.   Breast cancer metastasized to axillary lymph node   Staging form: Breast, AJCC 7th Edition     Clinical stage from 10/22/2014: Stage Unknown (TX, N1, M0) -  Signed by [name], MD on 10/22/2014   [name], MD   08/14/2019 1:12 PM

## 2019-08-11 NOTE — Progress Notes (Signed)
Patient is coming in for follow up she is doing well no complaints and no pain

## 2019-08-14 ENCOUNTER — Inpatient Hospital Stay (HOSPITAL_BASED_OUTPATIENT_CLINIC_OR_DEPARTMENT_OTHER): Payer: Medicare HMO | Admitting: Oncology

## 2019-08-14 ENCOUNTER — Inpatient Hospital Stay: Payer: Medicare HMO

## 2019-08-14 ENCOUNTER — Inpatient Hospital Stay: Payer: Medicare HMO | Attending: Oncology

## 2019-08-14 ENCOUNTER — Other Ambulatory Visit: Payer: Self-pay

## 2019-08-14 ENCOUNTER — Encounter: Payer: Self-pay | Admitting: *Deleted

## 2019-08-14 VITALS — BP 134/91 | HR 88 | Temp 97.6°F | Resp 18

## 2019-08-14 VITALS — BP 114/88 | HR 91 | Temp 96.1°F | Resp 18 | Wt 200.0 lb

## 2019-08-14 DIAGNOSIS — Z86718 Personal history of other venous thrombosis and embolism: Secondary | ICD-10-CM

## 2019-08-14 DIAGNOSIS — Z8542 Personal history of malignant neoplasm of other parts of uterus: Secondary | ICD-10-CM

## 2019-08-14 DIAGNOSIS — R5383 Other fatigue: Secondary | ICD-10-CM | POA: Diagnosis not present

## 2019-08-14 DIAGNOSIS — C189 Malignant neoplasm of colon, unspecified: Secondary | ICD-10-CM

## 2019-08-14 DIAGNOSIS — Z006 Encounter for examination for normal comparison and control in clinical research program: Secondary | ICD-10-CM | POA: Diagnosis not present

## 2019-08-14 DIAGNOSIS — I959 Hypotension, unspecified: Secondary | ICD-10-CM | POA: Diagnosis not present

## 2019-08-14 DIAGNOSIS — N289 Disorder of kidney and ureter, unspecified: Secondary | ICD-10-CM

## 2019-08-14 DIAGNOSIS — Z1509 Genetic susceptibility to other malignant neoplasm: Secondary | ICD-10-CM | POA: Insufficient documentation

## 2019-08-14 DIAGNOSIS — Z95828 Presence of other vascular implants and grafts: Secondary | ICD-10-CM

## 2019-08-14 DIAGNOSIS — D649 Anemia, unspecified: Secondary | ICD-10-CM

## 2019-08-14 DIAGNOSIS — Z853 Personal history of malignant neoplasm of breast: Secondary | ICD-10-CM | POA: Diagnosis not present

## 2019-08-14 DIAGNOSIS — Z87891 Personal history of nicotine dependence: Secondary | ICD-10-CM

## 2019-08-14 DIAGNOSIS — Z7901 Long term (current) use of anticoagulants: Secondary | ICD-10-CM | POA: Diagnosis not present

## 2019-08-14 DIAGNOSIS — C184 Malignant neoplasm of transverse colon: Secondary | ICD-10-CM

## 2019-08-14 DIAGNOSIS — Z9221 Personal history of antineoplastic chemotherapy: Secondary | ICD-10-CM | POA: Insufficient documentation

## 2019-08-14 DIAGNOSIS — Z5112 Encounter for antineoplastic immunotherapy: Secondary | ICD-10-CM | POA: Diagnosis present

## 2019-08-14 DIAGNOSIS — G629 Polyneuropathy, unspecified: Secondary | ICD-10-CM

## 2019-08-14 LAB — CBC WITH DIFFERENTIAL/PLATELET
Abs Immature Granulocytes: 0.02 10*3/uL (ref 0.00–0.07)
Basophils Absolute: 0 10*3/uL (ref 0.0–0.1)
Basophils Relative: 1 %
Eosinophils Absolute: 0.1 10*3/uL (ref 0.0–0.5)
Eosinophils Relative: 2 %
HCT: 29.1 % — ABNORMAL LOW (ref 36.0–46.0)
Hemoglobin: 9.7 g/dL — ABNORMAL LOW (ref 12.0–15.0)
Immature Granulocytes: 0 %
Lymphocytes Relative: 24 %
Lymphs Abs: 1.4 10*3/uL (ref 0.7–4.0)
MCH: 29.5 pg (ref 26.0–34.0)
MCHC: 33.3 g/dL (ref 30.0–36.0)
MCV: 88.4 fL (ref 80.0–100.0)
Monocytes Absolute: 0.5 10*3/uL (ref 0.1–1.0)
Monocytes Relative: 8 %
Neutro Abs: 3.9 10*3/uL (ref 1.7–7.7)
Neutrophils Relative %: 65 %
Platelets: 165 10*3/uL (ref 150–400)
RBC: 3.29 MIL/uL — ABNORMAL LOW (ref 3.87–5.11)
RDW: 18.3 % — ABNORMAL HIGH (ref 11.5–15.5)
WBC: 5.9 10*3/uL (ref 4.0–10.5)
nRBC: 0 % (ref 0.0–0.2)

## 2019-08-14 LAB — TSH: TSH: 1.362 u[IU]/mL (ref 0.350–4.500)

## 2019-08-14 LAB — COMPREHENSIVE METABOLIC PANEL
ALT: 16 U/L (ref 0–44)
AST: 21 U/L (ref 15–41)
Albumin: 3.3 g/dL — ABNORMAL LOW (ref 3.5–5.0)
Alkaline Phosphatase: 67 U/L (ref 38–126)
Anion gap: 8 (ref 5–15)
BUN: 10 mg/dL (ref 8–23)
CO2: 25 mmol/L (ref 22–32)
Calcium: 9.1 mg/dL (ref 8.9–10.3)
Chloride: 109 mmol/L (ref 98–111)
Creatinine, Ser: 0.91 mg/dL (ref 0.44–1.00)
GFR calc Af Amer: >60 mL/min (ref >60)
GFR calc non Af Amer: >60 mL/min (ref >60)
Glucose, Bld: 121 mg/dL — ABNORMAL HIGH (ref 70–99)
Potassium: 3.4 mmol/L — ABNORMAL LOW (ref 3.5–5.1)
Sodium: 142 mmol/L (ref 135–145)
Total Bilirubin: 0.5 mg/dL (ref 0.3–1.2)
Total Protein: 6.3 g/dL — ABNORMAL LOW (ref 6.5–8.1)

## 2019-08-14 MED ORDER — HEPARIN SOD (PORK) LOCK FLUSH 100 UNIT/ML IV SOLN
500.0000 [IU] | Freq: Once | INTRAVENOUS | Status: AC | PRN
  Administered 2019-08-14: 500 [IU]
  Filled 2019-08-14: qty 5

## 2019-08-14 MED ORDER — SODIUM CHLORIDE 0.9% FLUSH
10.0000 mL | Freq: Once | INTRAVENOUS | Status: AC
  Administered 2019-08-14: 10 mL via INTRAVENOUS
  Filled 2019-08-14: qty 10

## 2019-08-14 MED ORDER — HEPARIN SOD (PORK) LOCK FLUSH 100 UNIT/ML IV SOLN
INTRAVENOUS | Status: AC
  Filled 2019-08-14: qty 5

## 2019-08-14 MED ORDER — SODIUM CHLORIDE 0.9 % IV SOLN
840.0000 mg | Freq: Once | INTRAVENOUS | Status: AC
  Administered 2019-08-14: 840 mg via INTRAVENOUS
  Filled 2019-08-14: qty 14

## 2019-08-14 MED ORDER — SODIUM CHLORIDE 0.9 % IV SOLN
Freq: Once | INTRAVENOUS | Status: AC
  Filled 2019-08-14: qty 250

## 2019-08-14 NOTE — Research (Signed)
Patient Carrie Alvarez returns to clinic again this morning for consideration her C13D1 Huey Bienenstock only treatment on the [id number] [name] study. Patient had her 6 month CT scan last week; which showed no evidence of disease, but still showing her port thrombus present. [name] verified she has been taking the Eliquis twice daily. Patient reports experiencing worse neuropathy in her fingertips today - numbness, tingling and pain. She continues to experience tingling in her toes bilaterally, with some lingering numbness in the bottom of her feet, stating this is about the same as before. Patient reports her grips, especially in the left hand are weak and she is more prone to dropping things lately. Discussed that since she is no longer receiving the cytotoxic chemotherapy, this should gradually improve - and Dr. Grayland Ormond reinforced this with her as well. She is wearing gloves and states her electricity including heat have been off for the past 16 hours and she has been cold. B/P remains improved at 114/88. Reports she is still averaging one bowel movement per day now, but experienced some loose stools last week following her CT scan due to the contrast. States her peripheral neuropathy Local labs were collected via port-a-cath this morning and CBC and chemistry values were reviewed by Dr. Grayland Ormond. CEA and TSH were also collected, but have not yet resulted. She remains mildly anemic and platelets have recovered to normal. Temp 96.1 and SpO2 is 99% on room air this morning with patient wearing a mask, measured in infusion room prior to [employer] administration. Patient denies any abdominal pain at present, but continues to experience occasional soreness at the incision site. States the pain is intermittant and does not interfere with her ADLs, but her fatigue remains significant and she reports not feeling like doing much of anything due to her level of fatigue; which is partially due to current  chemotherapy and partially from baseline. Solicited adverse events were assessed by this RN today, and she completed the PRO-CTCAE booklet after Dr. Grayland Ormond saw patient and determined she could proceed with her atezolizumab infusion today. He discussed results of her CT scan last week as well as the possibility of increasing her gabapentin dose if needed for the neuropathy. Patient requests to wait a little longer and voices understanding that time is what will help with the neuropathy. Patient reports continued chronic left leg pain, for which she is seen by pain management clinic. Patient remains in a wheelchair while in clinic due to her ongoing issues with leg pain and fatigue, but states she is ambulatory and uses a cane while at home. Reports tenderness in the palms of her hands and the bottoms of her feet continues, and her skin remains darker in these areas. No weight loss noted. Potassium level is down to 3.4 mg/dL - which is grade 1 and patient voices compliance to potassium supplements.  Serum creatinine level is normal today at at 0.91mg /dL, and CrCl calculation is 84.75mL/min. Platelet remains wnl at 165,000. Patient's anemia remains at grade 2 with Hgb of 9.7g/dL again this morning - following her last chemotherapy 2 weeks ago. H&P completed by Dr. Grayland Ormond. Patient will receive C13 Atezolizumab as scheduled today. Solicited and other Adverse events with grade and attribution are as noted below.   Adverse Event Log  Study/Protocol: [id number] [name] Cycle: Cycle 12 Event Grade Onset Date Resolved Date Drug Name Attribution Treatment Comments  Diarrhea 1 05/18/2019 01/10/2019 07/14/2019 05/02/2019 n/a surgery Imodium Lomotil Decreased to 1-2 stools/day now   Abdominal pain 2 04/08/2019  01/10/2019 04/12/2019 04/08/2019 n/a surgery Oxycodone Reports pain resolved  Constipation 0      Denies  Nausea 1 06/07/2019 06/14/2019    Denies further nausea  Fatigue 2 06/12/2018 approx   ?  Reports no  energy in past 12 months  Anorexia 1 07/13/2008   ?  States this is her norm for several years  Cough 0        Dyspnea 0        Fever 1 06/07/2019 06/07/2019    Temp 99.6 in ED  UTI 0        Palmer-plantar erythrodesia 1 03/13/2019    otc cream Bilateral palms and anterior fingers discolored and tender  Total biliruben 0        Neutrophils 0        TSH 0      No history of hypothyroidism  Sensory Peripheral Neuropathy 3 06/13/2019 07/10/2019  Oxaliplatin Oxaliplatin discontinued Continuous, affecting ability to grasp objects & ambulation, ADLs  Sensory Peripheral Neuropathy 2 07/11/2019 11/19/2014  06/13/2019  Paclitaxel Plus Oxaliplatin gabapentin Numbness & Tingling worse in fingertips; same in toes/feet bil. since taking Taxol  Hypokalemia 1 08/14/2019 07/17/2019 03/27/2019 02/27/2019  07/31/2019 04/17/2019 03/13/2019  probable none K+ is 3.4 mg/dL today  Anemia 1 05/01/2019 06/26/2019      Anemia 2 06/26/2019 02/27/2019  04/17/2019  probable none Hgb 9.7 today  Elevated creatinine 1 02/07/2019 08/14/2019  unrelated  Elevated at baseline - wnl: 0.91 on 08/14/19  Platelet count   decreased 1 05/29/2019 03/27/2019 07/17/2019 04/17/2019  definitely none Plts  173,000 today  Possible Colitis 2 04/08/2019 04/12/2019  probable Steroids Resolved  Weight Loss 1 04/10/2019 04/17/2019  probable none Weight back to normal    Proteinuria 1 05/15/2019    none Urine dipstick only  Abdominal pain 1 05/22/2019   unlikely  Occasional incisional soreness  Hypokalemia 3 05/29/2019 06/26/2019  probable IV + oral K+ K+ is 2.9 mmol/L today  vomiting 1 06/06/2019 06/07/2019  unlikely  Resolved with IVFs  somnolence 2 06/05/2019 06/08/2019  unlikely    headache 2 06/05/2019 06/07/2019  unlikely  Had Toradol in ED  Port associated  thrombosis 3 07/10/2019   unlikely Eliquis Pt reported pain in right side of neck at port site  Hypotension 2 06/12/2019 07/17/2019  possible IV fluids Norvasc on hold   Chronic back & left leg pain 2 07/13/2017   unrelated  Reports 9/10 on pain scale this morning  [name], BSN, MHA, OCN 08/14/2019 10:55 AM

## 2019-08-15 LAB — CEA: CEA: 8.8 ng/mL — ABNORMAL HIGH (ref 0.0–4.7)

## 2019-08-21 ENCOUNTER — Other Ambulatory Visit: Payer: Self-pay | Admitting: Oncology

## 2019-08-26 NOTE — Progress Notes (Signed)
[place]  [phone] [phone]  ID: Carrie Alvarez OB: 26-Apr-1951  MR#: [id number]  AOZ#:[id number]  Patient Care Team: Paulene Floor as PCP - General (Physician Assistant) [name], MD as Consulting Physician (Oncology) [name], MD as Attending Physician (Pain Medicine) [name]., MD as Consulting Physician (Ophthalmology) [name], RN as Oncology Nurse Navigator  CHIEF COMPLAINT: Stage IIa ER+ left breast cancer with no breast lesion and axillary lymph node metastasis.  Now with stage IIIa colon cancer.  INTERVAL HISTORY: Patient returns to clinic today for further evaluation and continuation of maintenance Tecentriq.  She continues to have chronic sciatica and neuropathy, but no other neurologic complaints.  She has chronic weakness and fatigue. She denies any recent fevers or illnesses.  She has no chest pain, shortness of breath, cough, or hemoptysis.  She denies any nausea, vomiting, or constipation.  Although admits to diarrhea over the past several days.  She has no melena or hematochezia.  She has no urinary complaints.  Patient offers no further specific complaints today.  REVIEW OF SYSTEMS:   Review of Systems  Constitutional: Positive for malaise/fatigue. Negative for fever and weight loss.  Respiratory: Negative.  Negative for cough and shortness of breath.   Cardiovascular: Negative.  Negative for chest pain and leg swelling.  Gastrointestinal: Positive for diarrhea. Negative for abdominal pain, blood in stool, constipation, melena, nausea and vomiting.  Genitourinary: Negative.  Negative for dysuria.  Musculoskeletal: Positive for back pain. Negative for neck pain.  Skin: Negative.  Negative for rash.  Neurological: Positive for tingling, sensory change and weakness. Negative for dizziness, focal weakness and headaches.  Psychiatric/Behavioral: Negative.  Negative for depression.  The patient is not nervous/anxious.     As per HPI. Otherwise, a complete review of systems is negative.  PAST MEDICAL HISTORY: Past Medical History:  Diagnosis Date  . Allergy   . Anemia   . Back pain   . Breast cancer ([place]) 2015   LT LUMPECTOMY 12-2014 FOLLOWING CHEMO  . Carpal tunnel syndrome    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  . Cataract   . Colon cancer ([place]) 01/2019  . Depression   . Family history of breast cancer   . Family history of colon cancer   . GERD (gastroesophageal reflux disease)    problem with reflux during chemo  . History of methicillin resistant staphylococcus aureus (MRSA)   . Hypercholesteremia   . Hypertension   . Lumbosacral pain    sees pain management in [place]  . Obesity   . Personal history of chemotherapy 2016   left breast ca  . Personal history of chemotherapy 2020   colon ca  . Pinched nerve    left side, lumbar area  . Uterine cancer ([place]) 1994    PAST SURGICAL HISTORY: Past Surgical History:  Procedure Laterality Date  . ABDOMINAL HYSTERECTOMY  1994   UTERINE CA  . AXILLARY LYMPH NODE BIOPSY Left 12/18/2014   Procedure: AXILLARY LYMPH NODE BIOPSY/;  Surgeon: Robert Bellow, MD;  Location: [facility];  Service: General;  Laterality: Left;  . AXILLARY LYMPH NODE DISSECTION Left 12/18/2014   Procedure: AXILLARY LYMPH NODE DISSECTION;  Surgeon: Robert Bellow, MD;  Location: [facility];  Service: General;  Laterality: Left;  . BREAST BIOPSY Left 05/2014   CORE BX OF LN, METASTATIC ADENOCARCINOMA  . BREAST LUMPECTOMY Left 12/2014   CHEMO FIRST THEN SURGERY OF LN  . BREAST  SURGERY     lymph node removal  . CHOLECYSTECTOMY N/A 04/19/2016   Procedure: LAPAROSCOPIC CHOLECYSTECTOMY WITH INTRAOPERATIVE CHOLANGIOGRAM;  Surgeon: Hubbard Robinson, MD;  Location: [facility];  Service: General;  Laterality: N/A;  . COLON RESECTION Right 01/10/2019   Procedure: HAND ASSISTED LAPAROSCOPIC RIGHT COLON RESECTION;  Surgeon: Jules Husbands,  MD;  Location: [facility];  Service: General;  Laterality: Right;  . COLONOSCOPY WITH PROPOFOL N/A 12/30/2018   Procedure: COLONOSCOPY WITH PROPOFOL;  Surgeon: Lucilla Lame, MD;  Location: [place];  Service: Endoscopy;  Laterality: N/A;  . medial branch block  11/06/2015   lumbar facet Dr. Primus Bravo  . POLYPECTOMY N/A 12/30/2018   Procedure: POLYPECTOMY;  Surgeon: Lucilla Lame, MD;  Location: [place];  Service: Endoscopy;  Laterality: N/A;  Clips placed at Hepatic Flexure Polyp (2) and Transverse Colon Polyp (4) removal sites  . PORTACATH PLACEMENT Right 02/02/2019   Procedure: INSERTION PORT-A-CATH;  Surgeon: Jules Husbands, MD;  Location: [facility];  Service: General;  Laterality: Right;  . SENTINEL NODE BIOPSY Left 12/18/2014   Procedure: SENTINEL NODE BIOPSY;  Surgeon: Robert Bellow, MD;  Location: [facility];  Service: General;  Laterality: Left;    FAMILY HISTORY Family History  Problem Relation Age of Onset  . Breast cancer Sister 3  . Colon cancer Sister   . Colon cancer Mother        dx 63s  . Hypertension Mother   . Asthma Mother   . Cancer Father        voice box removed  . Cancer Maternal Grandmother        unsure type  . Colon cancer Cousin   . Cancer Cousin        unsure type       ADVANCED DIRECTIVES:    HEALTH MAINTENANCE: Social History   Tobacco Use  . Smoking status: Former Smoker    Packs/day: 0.50    Types: Cigarettes    Quit date: 07/18/2014    Years since quitting: 5.1  . Smokeless tobacco: Never Used  Substance Use Topics  . Alcohol use: No    Alcohol/week: 0.0 standard drinks  . Drug use: No    No Known Allergies  Current Outpatient Medications  Medication Sig Dispense Refill  . acetaminophen (TYLENOL) 500 MG tablet Take 500 mg by mouth every 6 (six) hours as needed for mild pain or moderate pain.     Marland Kitchen albuterol (PROVENTIL HFA;VENTOLIN HFA) 108 (90 Base) MCG/ACT inhaler Inhale 2 puffs into the lungs every 6 (six) hours as  needed for wheezing or shortness of breath. 1 Inhaler 2  . apixaban (ELIQUIS) 5 MG TABS tablet Take 1 tablet (5 mg total) by mouth 2 (two) times daily. 60 tablet 3  . atorvastatin (LIPITOR) 10 MG tablet TAKE 1 TABLET BY MOUTH ONCE DAILY IN THE MORNING 90 tablet 0  . baclofen (LIORESAL) 10 MG tablet Take 1 tablet (10 mg total) by mouth 2 (two) times daily. (Patient taking differently: Take 10 mg by mouth 3 (three) times daily as needed for muscle spasms. ) 30 each 0  . cholecalciferol (VITAMIN D) 1000 units tablet Take 1,000 Units by mouth daily.    . diphenoxylate-atropine (LOMOTIL) 2.5-0.025 MG tablet Take 1 tablet by mouth 4 (four) times daily as needed for diarrhea or loose stools. 30 tablet 0  . fluticasone (FLONASE) 50 MCG/ACT nasal spray Use 2 spray(s) in each nostril once daily 16 g 0  . gabapentin (  NEURONTIN) 300 MG capsule Take 300-600 mg by mouth 4 (four) times daily as needed (neuropathic pain).     Marland Kitchen lidocaine-prilocaine (EMLA) cream Apply to affected area once 30 g 3  . ondansetron (ZOFRAN ODT) 4 MG disintegrating tablet Take 1 tablet (4 mg total) by mouth every 8 (eight) hours as needed for nausea or vomiting. 20 tablet 0  . Oxycodone HCl 10 MG TABS LIMIT ONE HALF TO ONE TABLET BY MOUTH 3 TO 5 TIMES DAILY IF TOLERATED    . potassium chloride SA (KLOR-CON) 20 MEQ tablet Take 1 tablet (20 mEq total) by mouth 2 (two) times daily. 60 tablet 1  . predniSONE (DELTASONE) 50 MG tablet Take 1 tab daily for a total of 7 days. 7 tablet 0  . pyridoxine (B-6) 100 MG tablet Take 100 mg by mouth daily.    . sertraline (ZOLOFT) 100 MG tablet Take 2 tablets by mouth once daily 180 tablet 0  . vitamin B-12 (CYANOCOBALAMIN) 500 MCG tablet Take 500 mcg by mouth daily.    Marland Kitchen amLODipine (NORVASC) 10 MG tablet Take 1 tablet by mouth once daily (Patient not taking: Reported on 08/28/2019) 90 tablet 0   No current facility-administered medications for this visit.   Facility-Administered Medications Ordered in  Other Visits  Medication Dose Route Frequency Provider Last Rate Last Admin  . heparin lock flush 100 unit/mL  500 Units Intravenous Once [name], MD      . heparin lock flush 100 unit/mL  500 Units Intravenous Once [name], MD      . sodium chloride flush (NS) 0.9 % injection 10 mL  10 mL Intravenous PRN [name], MD   10 mL at 02/27/19 0925    OBJECTIVE: Vitals:   08/28/19 0955  BP: (!) 126/98  Pulse: 84  Resp: 18  Temp: (!) 95.4 F (35.2 C)  SpO2: 100%     Body mass index is 33.82 kg/m.    ECOG FS:1 - Symptomatic but completely ambulatory  General: Well-developed, well-nourished, no acute distress.  Sitting in a wheelchair. Eyes: Pink conjunctiva, anicteric sclera. HEENT: Normocephalic, moist mucous membranes. Lungs: No audible wheezing or coughing. Heart: Regular rate and rhythm. Abdomen: Soft, nontender, no obvious distention. Musculoskeletal: No edema, cyanosis, or clubbing. Neuro: Alert, answering all questions appropriately. Cranial nerves grossly intact. Skin: No rashes or petechiae noted. Psych: Normal affect.   LAB RESULTS:  Lab Results  Component Value Date   NA 142 08/28/2019   K 3.7 08/28/2019   CL 108 08/28/2019   CO2 25 08/28/2019   GLUCOSE 89 08/28/2019   BUN 11 08/28/2019   CREATININE 1.11 (H) 08/28/2019   CALCIUM 9.8 08/28/2019   PROT 7.2 08/28/2019   ALBUMIN 3.9 08/28/2019   AST 20 08/28/2019   ALT 14 08/28/2019   ALKPHOS 77 08/28/2019   BILITOT 0.7 08/28/2019   GFRNONAA 51 (L) 08/28/2019   GFRAA 59 (L) 08/28/2019    Lab Results  Component Value Date   WBC 7.3 08/28/2019   NEUTROABS 4.9 08/28/2019   HGB 11.4 (L) 08/28/2019   HCT 35.0 (L) 08/28/2019   MCV 88.6 08/28/2019   PLT 211 08/28/2019     STUDIES: CT Chest W Contrast  Result Date: 08/07/2019 CLINICAL DATA:  Restaging colon cancer. Initial diagnosis 6 months ago. History of surgery, radiation and chemotherapy. Patient also has a prior  history of uterine cancer and breast cancer. EXAM: CT CHEST, ABDOMEN, AND PELVIS WITH CONTRAST TECHNIQUE: Multidetector CT imaging  of the chest, abdomen and pelvis was performed following the standard protocol during bolus administration of intravenous contrast. CONTRAST:  147m OMNIPAQUE IOHEXOL 300 MG/ML  SOLN COMPARISON:  02/07/2019 chest CT and 01/02/2019 abdominal CT. FINDINGS: CT CHEST FINDINGS Cardiovascular: Stable mild fusiform aneurysmal dilatation of the ascending aorta with maximum diameter of 4.0 cm. No dissection. The branch vessels are patent. Minimal scattered atherosclerotic calcifications at the aortic arch. No obvious coronary artery calcifications. The pulmonary arteries appear normal. Mediastinum/Nodes: No mediastinal or hilar mass or adenopathy. The esophagus is grossly normal. There is a right-sided Port-A-Cath in place. There is new clot around the catheter in the right internal jugular vein. Lungs/Pleura: The lungs are clear of an acute process. Linear scarring type changes in the left upper lobe with calcification. No worrisome pulmonary lesions and no pulmonary nodules to suggest metastatic disease. No pleural effusion or pleural lesions. Musculoskeletal: No breast masses, supraclavicular or axillary adenopathy. No significant bony findings. CT ABDOMEN PELVIS FINDINGS Hepatobiliary: No focal hepatic lesions to suggest hepatic metastatic disease. The gallbladder is surgically absent. No intra or extrahepatic biliary dilatation. The portal and hepatic veins appear normal. Pancreas: No mass, inflammation or ductal dilatation. Spleen: Normal size.  No focal lesions. Adrenals/Urinary Tract: The adrenal glands and kidneys are unremarkable. The bladder appears normal. Stomach/Bowel: The stomach, duodenum, small bowel and colon are unremarkable. Stable surgical changes from a right hemicolectomy. No findings for recurrent tumor at the anastomosis site. Vascular/Lymphatic: Stable scattered aortic  and iliac artery calcifications but no aneurysm or dissection. The branch vessels are patent. The major venous structures are patent. No mesenteric or retroperitoneal lymphadenopathy. No omental or peritoneal surface lesions are identified. Reproductive: Surgically absent. Other: No pelvic mass or adenopathy. No free pelvic fluid collections. No inguinal mass or adenopathy. No abdominal wall hernia or subcutaneous lesions. Musculoskeletal: No significant bony findings. IMPRESSION: 1. Status post right hemicolectomy. No findings to suggest recurrent tumor, locoregional adenopathy or distant metastatic disease. 2. Stable tortuosity, ectasia and fusiform aneurysmal dilatation of the ascending thoracic aorta. 3. Suspect new clot around the Port-A-Cath in the right internal jugular vein. 4. Status post cholecystectomy.  No biliary dilatation. Electronically Signed   By: PMarijo SanesM.D.   On: 08/07/2019 10:08   CT Abdomen Pelvis W Contrast  Result Date: 08/07/2019 CLINICAL DATA:  Restaging colon cancer. Initial diagnosis 6 months ago. History of surgery, radiation and chemotherapy. Patient also has a prior history of uterine cancer and breast cancer. EXAM: CT CHEST, ABDOMEN, AND PELVIS WITH CONTRAST TECHNIQUE: Multidetector CT imaging of the chest, abdomen and pelvis was performed following the standard protocol during bolus administration of intravenous contrast. CONTRAST:  1084mOMNIPAQUE IOHEXOL 300 MG/ML  SOLN COMPARISON:  02/07/2019 chest CT and 01/02/2019 abdominal CT. FINDINGS: CT CHEST FINDINGS Cardiovascular: Stable mild fusiform aneurysmal dilatation of the ascending aorta with maximum diameter of 4.0 cm. No dissection. The branch vessels are patent. Minimal scattered atherosclerotic calcifications at the aortic arch. No obvious coronary artery calcifications. The pulmonary arteries appear normal. Mediastinum/Nodes: No mediastinal or hilar mass or adenopathy. The esophagus is grossly normal. There is a  right-sided Port-A-Cath in place. There is new clot around the catheter in the right internal jugular vein. Lungs/Pleura: The lungs are clear of an acute process. Linear scarring type changes in the left upper lobe with calcification. No worrisome pulmonary lesions and no pulmonary nodules to suggest metastatic disease. No pleural effusion or pleural lesions. Musculoskeletal: No breast masses, supraclavicular or axillary adenopathy. No significant bony  findings. CT ABDOMEN PELVIS FINDINGS Hepatobiliary: No focal hepatic lesions to suggest hepatic metastatic disease. The gallbladder is surgically absent. No intra or extrahepatic biliary dilatation. The portal and hepatic veins appear normal. Pancreas: No mass, inflammation or ductal dilatation. Spleen: Normal size.  No focal lesions. Adrenals/Urinary Tract: The adrenal glands and kidneys are unremarkable. The bladder appears normal. Stomach/Bowel: The stomach, duodenum, small bowel and colon are unremarkable. Stable surgical changes from a right hemicolectomy. No findings for recurrent tumor at the anastomosis site. Vascular/Lymphatic: Stable scattered aortic and iliac artery calcifications but no aneurysm or dissection. The branch vessels are patent. The major venous structures are patent. No mesenteric or retroperitoneal lymphadenopathy. No omental or peritoneal surface lesions are identified. Reproductive: Surgically absent. Other: No pelvic mass or adenopathy. No free pelvic fluid collections. No inguinal mass or adenopathy. No abdominal wall hernia or subcutaneous lesions. Musculoskeletal: No significant bony findings. IMPRESSION: 1. Status post right hemicolectomy. No findings to suggest recurrent tumor, locoregional adenopathy or distant metastatic disease. 2. Stable tortuosity, ectasia and fusiform aneurysmal dilatation of the ascending thoracic aorta. 3. Suspect new clot around the Port-A-Cath in the right internal jugular vein. 4. Status post  cholecystectomy.  No biliary dilatation. Electronically Signed   By: Marijo Sanes M.D.   On: 08/07/2019 10:08    ASSESSMENT: Stage IIa ER+ left breast cancer with no breast lesion and axillary lymph node metastasis. (TxN1M0)  HER-2 negative.  Now with stage IIIa colon cancer.  PLAN:   1.  Stage IIIa colon cancer: Pathology report reviewed independently.  Patient's most recent imaging on August 07, 2019 with CT scans revealed no evidence of recurrent or progressive disease.  Previously, patient agreed to enroll in clinical trial.  Oxaliplatin was discontinued secondary to worsening neuropathy.  Patient completed 12 cycles of chemotherapy on July 31, 2019.  Continue with maintenance Tecentriq for an additional 6 months.  Proceed with Tecentriq today.  Return to clinic in 2 weeks for further evaluation and continuation of treatment. 2. Stage IIa ER+ left breast cancer with no breast lesion and axillary lymph node metastasis: Despite no obvious breast lesion on mammogram or breast MRI, pathology and pattern of spread was consistent with primary breast cancer. CT and bone scan at time of diagnosis revealed no other evidence of malignancy.  She only received 3 cycles of Adriamycin and Cytoxan. Taxol was discontinued early as well secondary to persistent peripheral neuropathy. Patient completed her chemotherapy on Nov 19, 2014.  She elected not to pursue axillary node dissection given the potential morbidity of this procedure. It was also elected not to pursue adjuvant XRT given there was no primary breast lesion.  Patient completed approximately 4 years of treatment with letrozole, but this has been discontinued secondary to enrolling in clinical trial for her newly diagnosed colon cancer.  Her most recent mammogram on July 04, 2019 was reported as BI-RADS 1.  Repeat in December 2021. 3.  Bone health: Patient's most recent bone mineral density on February 03, 2018 reported T score of -0.7 which is unchanged  from 3 years prior.  Consider repeat bone mineral density in July 2021.  4.  Lynch syndrome: Genetic testing confirmed patient has Lynch syndrome.  She reports her sister and son both have tested positive.  She plans to talk to the rest of her children about genetic testing.  Appreciate genetics input. 5.  Endometrial cancer: Patient has had a total hysterectomy. 6.  Pain/Peripheral neuropathy: Grade 3.  Chronic and unchanged.  Agree with  current medications from pain clinic.  Continue follow-up with them as indicated.   7. Anemia: Hemoglobin slowly improving and is now 11.4. 8.  Thrombocytopenia: Resolved. 9.  Renal insufficiency: Mild, monitor.  Patient's creatinine is 1.11 today. 10.  Hypokalemia: Mild continue oral potassium supplementation 20 mEq twice per day. 11.  Diarrhea: Patient has been instructed to use Imodium as needed. 12.  Fatigue: Chronic and unchanged.  Patient has no new medications, she is anemic but this is relatively unchanged.  Thyroid studies are within normal limits.  She states her sleep habits are good.  Continue to monitor and consider a trial of dexamethasone or Remeron. 13.  Hypotension: Improved.  Patient has been instructed to reinitiate Norvasc at 5 mg daily. 14.  Port associated clot: Continue Eliquis as prescribed for a total of 3 months completing treatment in March 2021.   Patient expressed understanding and was in agreement with this plan. She also understands that She can call clinic at any time with any questions, concerns, or complaints.   Breast cancer metastasized to axillary lymph node   Staging form: Breast, AJCC 7th Edition     Clinical stage from 10/22/2014: Stage Unknown (TX, N1, M0) - Signed by [name], MD on 10/22/2014   [name], MD   08/28/2019 12:56 PM

## 2019-08-28 ENCOUNTER — Other Ambulatory Visit: Payer: Self-pay

## 2019-08-28 ENCOUNTER — Encounter: Payer: Self-pay | Admitting: *Deleted

## 2019-08-28 ENCOUNTER — Inpatient Hospital Stay (HOSPITAL_BASED_OUTPATIENT_CLINIC_OR_DEPARTMENT_OTHER): Payer: Medicare HMO | Admitting: Oncology

## 2019-08-28 ENCOUNTER — Inpatient Hospital Stay: Payer: Medicare HMO

## 2019-08-28 VITALS — BP 147/97 | HR 73 | Temp 96.5°F | Resp 18

## 2019-08-28 VITALS — BP 126/98 | HR 84 | Temp 95.4°F | Resp 18 | Wt 190.9 lb

## 2019-08-28 DIAGNOSIS — Z86718 Personal history of other venous thrombosis and embolism: Secondary | ICD-10-CM | POA: Diagnosis not present

## 2019-08-28 DIAGNOSIS — Z1509 Genetic susceptibility to other malignant neoplasm: Secondary | ICD-10-CM

## 2019-08-28 DIAGNOSIS — N289 Disorder of kidney and ureter, unspecified: Secondary | ICD-10-CM | POA: Diagnosis not present

## 2019-08-28 DIAGNOSIS — Z853 Personal history of malignant neoplasm of breast: Secondary | ICD-10-CM

## 2019-08-28 DIAGNOSIS — Z7901 Long term (current) use of anticoagulants: Secondary | ICD-10-CM

## 2019-08-28 DIAGNOSIS — C189 Malignant neoplasm of colon, unspecified: Secondary | ICD-10-CM | POA: Diagnosis not present

## 2019-08-28 DIAGNOSIS — I959 Hypotension, unspecified: Secondary | ICD-10-CM | POA: Diagnosis not present

## 2019-08-28 DIAGNOSIS — Z8542 Personal history of malignant neoplasm of other parts of uterus: Secondary | ICD-10-CM

## 2019-08-28 DIAGNOSIS — C184 Malignant neoplasm of transverse colon: Secondary | ICD-10-CM

## 2019-08-28 DIAGNOSIS — G629 Polyneuropathy, unspecified: Secondary | ICD-10-CM

## 2019-08-28 DIAGNOSIS — R5383 Other fatigue: Secondary | ICD-10-CM | POA: Diagnosis not present

## 2019-08-28 DIAGNOSIS — Z87891 Personal history of nicotine dependence: Secondary | ICD-10-CM

## 2019-08-28 DIAGNOSIS — Z95828 Presence of other vascular implants and grafts: Secondary | ICD-10-CM

## 2019-08-28 DIAGNOSIS — Z9221 Personal history of antineoplastic chemotherapy: Secondary | ICD-10-CM | POA: Diagnosis not present

## 2019-08-28 DIAGNOSIS — D649 Anemia, unspecified: Secondary | ICD-10-CM | POA: Diagnosis not present

## 2019-08-28 DIAGNOSIS — Z5112 Encounter for antineoplastic immunotherapy: Secondary | ICD-10-CM | POA: Diagnosis not present

## 2019-08-28 LAB — COMPREHENSIVE METABOLIC PANEL WITH GFR
ALT: 14 U/L (ref 0–44)
AST: 20 U/L (ref 15–41)
Albumin: 3.9 g/dL (ref 3.5–5.0)
Alkaline Phosphatase: 77 U/L (ref 38–126)
Anion gap: 9 (ref 5–15)
BUN: 11 mg/dL (ref 8–23)
CO2: 25 mmol/L (ref 22–32)
Calcium: 9.8 mg/dL (ref 8.9–10.3)
Chloride: 108 mmol/L (ref 98–111)
Creatinine, Ser: 1.11 mg/dL — ABNORMAL HIGH (ref 0.44–1.00)
GFR calc Af Amer: 59 mL/min — ABNORMAL LOW
GFR calc non Af Amer: 51 mL/min — ABNORMAL LOW
Glucose, Bld: 89 mg/dL (ref 70–99)
Potassium: 3.7 mmol/L (ref 3.5–5.1)
Sodium: 142 mmol/L (ref 135–145)
Total Bilirubin: 0.7 mg/dL (ref 0.3–1.2)
Total Protein: 7.2 g/dL (ref 6.5–8.1)

## 2019-08-28 LAB — CBC WITH DIFFERENTIAL/PLATELET
Abs Immature Granulocytes: 0.05 10*3/uL (ref 0.00–0.07)
Basophils Absolute: 0.1 10*3/uL (ref 0.0–0.1)
Basophils Relative: 1 %
Eosinophils Absolute: 0.1 10*3/uL (ref 0.0–0.5)
Eosinophils Relative: 2 %
HCT: 35 % — ABNORMAL LOW (ref 36.0–46.0)
Hemoglobin: 11.4 g/dL — ABNORMAL LOW (ref 12.0–15.0)
Immature Granulocytes: 1 %
Lymphocytes Relative: 21 %
Lymphs Abs: 1.6 10*3/uL (ref 0.7–4.0)
MCH: 28.9 pg (ref 26.0–34.0)
MCHC: 32.6 g/dL (ref 30.0–36.0)
MCV: 88.6 fL (ref 80.0–100.0)
Monocytes Absolute: 0.6 10*3/uL (ref 0.1–1.0)
Monocytes Relative: 8 %
Neutro Abs: 4.9 10*3/uL (ref 1.7–7.7)
Neutrophils Relative %: 67 %
Platelets: 211 10*3/uL (ref 150–400)
RBC: 3.95 MIL/uL (ref 3.87–5.11)
RDW: 16.8 % — ABNORMAL HIGH (ref 11.5–15.5)
WBC: 7.3 10*3/uL (ref 4.0–10.5)
nRBC: 0 % (ref 0.0–0.2)

## 2019-08-28 MED ORDER — SODIUM CHLORIDE 0.9 % IV SOLN
Freq: Once | INTRAVENOUS | Status: AC
  Filled 2019-08-28: qty 250

## 2019-08-28 MED ORDER — HEPARIN SOD (PORK) LOCK FLUSH 100 UNIT/ML IV SOLN
500.0000 [IU] | Freq: Once | INTRAVENOUS | Status: AC | PRN
  Administered 2019-08-28: 500 [IU]
  Filled 2019-08-28: qty 5

## 2019-08-28 MED ORDER — SODIUM CHLORIDE 0.9% FLUSH
10.0000 mL | Freq: Once | INTRAVENOUS | Status: AC
  Administered 2019-08-28: 10 mL via INTRAVENOUS
  Filled 2019-08-28: qty 10

## 2019-08-28 MED ORDER — HEPARIN SOD (PORK) LOCK FLUSH 100 UNIT/ML IV SOLN
INTRAVENOUS | Status: AC
  Filled 2019-08-28: qty 5

## 2019-08-28 MED ORDER — SODIUM CHLORIDE 0.9 % IV SOLN
840.0000 mg | Freq: Once | INTRAVENOUS | Status: AC
  Administered 2019-08-28: 840 mg via INTRAVENOUS
  Filled 2019-08-28: qty 14

## 2019-08-28 NOTE — Progress Notes (Signed)
1030: Per Dr. Grayland Ormond okay to proceed with B/P of 126/98.   1140: Per order and Ulis Rias research RN post VS to be done WITHIN 30 minutes post infusion, Pt does not have to stay for 30 minutes post infusion. Pt stable and VS baseline at discharge.   1248: Ulis Rias RN aware of patient's b/p throughout treatment and per Ulis Rias, will discuss with Dr. Grayland Ormond.

## 2019-08-28 NOTE — Research (Signed)
Patient Carrie Alvarez returns to clinic again this morning for consideration her C14D1 Huey Bienenstock only treatment on the [id number] [name] study. Patient reports experiencing more noticeable neuropathy in fingers and bil. feet today and states she is having increased sciatic type pain radiating down the left side of her back and left leg. She is currently being seen by pain management clinic for this and has received one injection for the back pain, but states she needs another and will see pain management tomorrow and ask about this. [name] verified she has been taking the Eliquis twice daily. Patient reports her grips, especially in the left hand remain weak but have actually improved a little lately. B/P is a little elevated at 126/98. She is afebrile and SpO2 is 100% on room air while wearing a mask. Reports she was averaging one bowel movement per day, but began having increased stools - currently at 5/day for the past 3 days. Dr. Grayland Ormond instructed her to begin taking the Imodium again until this resolves. Local labs were collected via port-a-cath this morning and CBC and chemistry values were reviewed by Dr. Grayland Ormond.  She is just slightly anemic and platelets have recovered to normal. Patient denies any abdominal pain at present, but continues to experience occasional soreness at the incision site, and has increased abdominal pain with bowel movements. States the pain is intermittant and does not interfere with her ADLs, but her fatigue remains significant and she reports not feeling like doing much of anything due to her level of fatigue; which is partially due to current chemotherapy and partially from baseline. Solicited adverse events were assessed by this RN today, and she completed the PRO-CTCAE booklet after Dr. Grayland Ormond saw patient and determined she could proceed with her atezolizumab infusion today. Patient remains in a wheelchair while in clinic due to her ongoing issues with leg pain  and fatigue, but states she is ambulatory and uses a cane while at home. Reports tenderness in the palms of her hands and the bottoms of her feet continues, and her skin remains darker in these areas. No weight loss noted. Potassium is wnl at 3.7 mg/dL - and patient voices compliance to potassium supplements.  Serum creatinine level is slightly elevated today at at 1.11mg /dL, and CrCl calculation is 66.86mL/min. Platelet remains wnl at 211,000. Patient's anemia is now grade 1 with improved Hgb of 11.4g/dL this morning. H&P completed by Dr. Grayland Ormond. Patient will receive C14 Atezolizumab as scheduled today. Solicited and other Adverse events with grade and attribution are as noted below.   Adverse Event Log  Study/Protocol: [id number] [name] Cycle: Cycle 13 Event Grade Onset Date Resolved Date Drug Name Attribution Treatment Comments  Diarrhea 1 08/25/2019 05/18/2019 01/10/2019  07/14/2019 05/02/2019 n/a surgery Imodium  Reports approx 5 stools/day x past 5 days now   Abdominal pain 2 04/08/2019 01/10/2019 04/12/2019 04/08/2019 n/a surgery Oxycodone Reports pain resolved  Constipation 0      Denies  Nausea 1 06/07/2019 06/14/2019    Denies further nausea  Fatigue 2 06/12/2018 approx   ?  Reports no energy in past 12 months  Anorexia 1 07/13/2008   ?  States this is her norm for several years  Cough 0        Dyspnea 0        Fever 1 06/07/2019 06/07/2019    Temp 99.6 in ED  UTI 0        Palmer-plantar erythrodesia 1 03/13/2019    otc cream Bilateral palms & anterior  fingers remain discolored & more tender today  Total biliruben 0        Neutrophils 0        TSH 0      No history of hypothyroidism  Sensory Peripheral Neuropathy 3 06/13/2019 07/10/2019  Oxaliplatin Oxaliplatin discontinued Continuous, affecting ability to grasp objects & ambulation, ADLs  Sensory Peripheral Neuropathy 2 07/11/2019 11/19/2014  06/13/2019  Paclitaxel Plus Oxaliplatin gabapentin Numbness & Tingling in fingertips,  toes & feet bil. since taking Taxol in 2014  Hypokalemia 1 08/14/2019 07/17/2019 03/27/2019 02/27/2019 08/28/2019 07/31/2019 04/17/2019 03/13/2019  probable none K+ is 3.7 mg/dL today  Anemia 1 08/28/2019 05/01/2019  06/26/2019    Hgb 11.4  Anemia 2 06/26/2019 02/27/2019 08/28/2019 04/17/2019  probable none Hgb 11.4 today  Elevated creatinine 1 08/28/2019 02/07/2019  08/14/2019  unrelated  Elevated at baseline - now 1.11 today  Platelet count   decreased 1 05/29/2019 03/27/2019 07/17/2019 04/17/2019  definitely none Plts  211,000 today  Possible Colitis 2 04/08/2019 04/12/2019  probable Steroids Resolved  Weight Loss 1 04/10/2019 04/17/2019  probable none Weight back to normal    Proteinuria 1 05/15/2019    none Urine dipstick only  Abdominal pain 1 05/22/2019   unlikely  Occasional incisional soreness  Hypokalemia 3 05/29/2019 06/26/2019  probable IV + oral K+ K+ is 2.9 mmol/L today  vomiting 1 06/06/2019 06/07/2019  unlikely  Resolved with IVFs  somnolence 2 06/05/2019 06/08/2019  unlikely    headache 2 06/05/2019 06/07/2019  unlikely  Had Toradol in ED  Port associated  thrombosis 3 07/10/2019   unlikely Eliquis Pt reported pain in right side of neck at port site  Hypotension 2 06/12/2019 07/17/2019  possible IV fluids Norvasc on hold  Chronic back & left leg pain 2 07/13/2017   unrelated  Reports 9/10 on pain scale this morning  Carrie Alvarez, BSN, MHA, OCN 08/28/2019 10:55 AM  Post infusion: Report from Meribeth Mattes, RN in infusion that patient's b/p was elevated pre and post infusion. Dr. Grayland Ormond informed of this and he states to have the patient restart Norvasc at 5mg  per day. Patient was previously on Norvasc 10mg  daily. T/C made to patient's cell phone and home phone and message left for patient to call me back regarding her blood pressure medication. Carrie Alvarez, BSN, MHA, OCN 08/28/2019 1:26 PM  Patient returned call to this RN and was informed that Dr. Grayland Ormond wants  her to begin taking Norvasc(amlodipine) 5mg  daily for her blood pressure. Patient able to repeat instructions and verified that she still has Norvasc 10mg  from a prior prescription. Instructed that she may break them in half and just take 1/2 tab daily until able to pick up her new prescription. Patient verified that she uses the [facility] on [address]. Dr. Grayland Ormond will e-scribe the new prescription to this pharmacy. Carrie Alvarez, BSN, MHA, OCN 08/28/2019 3:04 PM

## 2019-08-29 DIAGNOSIS — M169 Osteoarthritis of hip, unspecified: Secondary | ICD-10-CM | POA: Diagnosis not present

## 2019-08-29 DIAGNOSIS — M869 Osteomyelitis, unspecified: Secondary | ICD-10-CM | POA: Diagnosis not present

## 2019-08-29 DIAGNOSIS — G894 Chronic pain syndrome: Secondary | ICD-10-CM | POA: Diagnosis not present

## 2019-08-29 DIAGNOSIS — M5136 Other intervertebral disc degeneration, lumbar region: Secondary | ICD-10-CM | POA: Diagnosis not present

## 2019-08-29 DIAGNOSIS — M542 Cervicalgia: Secondary | ICD-10-CM | POA: Diagnosis not present

## 2019-08-29 DIAGNOSIS — M545 Low back pain: Secondary | ICD-10-CM | POA: Diagnosis not present

## 2019-08-29 LAB — THYROID PANEL WITH TSH
Free Thyroxine Index: 1.4 (ref 1.2–4.9)
T3 Uptake Ratio: 27 % (ref 24–39)
T4, Total: 5 ug/dL (ref 4.5–12.0)
TSH: 0.928 u[IU]/mL (ref 0.450–4.500)

## 2019-09-08 ENCOUNTER — Other Ambulatory Visit: Payer: Self-pay

## 2019-09-09 NOTE — Progress Notes (Signed)
Carrie Alvarez  [phone] [phone]) [phone]  ID: Norm Salt OB: 13-Oct-1950  MR#: [id number]  ATF#:[id number]  Patient Care Team: Paulene Floor as PCP - General (Physician Assistant) [name], MD as Consulting Physician (Oncology) [name], MD as Attending Physician (Pain Medicine) [name]., MD as Consulting Physician (Ophthalmology) [name], RN as Oncology Nurse Navigator  CHIEF COMPLAINT: Stage IIa ER+ left breast cancer with no breast lesion and axillary lymph node metastasis.  Now with stage IIIa colon cancer.  INTERVAL HISTORY: Patient returns to clinic today for further evaluation and continuation of maintenance Tecentriq on clinical trial.  Her chronic sciatica is much worse today and she has an appointment with pain clinic tomorrow.  Her peripheral neuropathy is unchanged.  She has no other neurologic complaints.  She has chronic weakness and fatigue. She denies any recent fevers or illnesses.  She has no chest pain, shortness of breath, cough, or hemoptysis.  She denies any nausea, vomiting, or constipation.  She reports her diarrhea has resolved.  She has no melena or hematochezia.  She has no urinary complaints.  Patient offers no further specific complaints today.  REVIEW OF SYSTEMS:   Review of Systems  Constitutional: Positive for malaise/fatigue. Negative for fever and weight loss.  Respiratory: Negative.  Negative for cough and shortness of breath.   Cardiovascular: Negative.  Negative for chest pain and leg swelling.  Gastrointestinal: Negative.  Negative for abdominal pain, blood in stool, constipation, diarrhea, melena, nausea and vomiting.  Genitourinary: Negative.  Negative for dysuria.  Musculoskeletal: Positive for back pain. Negative for neck pain.  Skin: Negative.  Negative for rash.  Neurological: Positive for tingling, sensory change and weakness. Negative for dizziness, focal  weakness and headaches.  Psychiatric/Behavioral: Negative.  Negative for depression. The patient is not nervous/anxious.     As per HPI. Otherwise, a complete review of systems is negative.  PAST MEDICAL HISTORY: Past Medical History:  Diagnosis Date  . Allergy   . Anemia   . Back pain   . Breast cancer ([place]) 2015   LT LUMPECTOMY 12-2014 FOLLOWING CHEMO  . Carpal tunnel syndrome    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  . Cataract   . Colon cancer ([place]) 01/2019  . Depression   . Family history of breast cancer   . Family history of colon cancer   . GERD (gastroesophageal reflux disease)    problem with reflux during chemo  . History of methicillin resistant staphylococcus aureus (MRSA)   . Hypercholesteremia   . Hypertension   . Lumbosacral pain    sees pain management in [place]  . Obesity   . Personal history of chemotherapy 2016   left breast ca  . Personal history of chemotherapy 2020   colon ca  . Pinched nerve    left side, lumbar area  . Uterine cancer ([place]) 1994    PAST SURGICAL HISTORY: Past Surgical History:  Procedure Laterality Date  . ABDOMINAL HYSTERECTOMY  1994   UTERINE CA  . AXILLARY LYMPH NODE BIOPSY Left 12/18/2014   Procedure: AXILLARY LYMPH NODE BIOPSY/;  Surgeon: Robert Bellow, MD;  Location: [facility];  Service: General;  Laterality: Left;  . AXILLARY LYMPH NODE DISSECTION Left 12/18/2014   Procedure: AXILLARY LYMPH NODE DISSECTION;  Surgeon: Robert Bellow, MD;  Location: [facility];  Service: General;  Laterality: Left;  . BREAST BIOPSY Left 05/2014   CORE BX OF LN, METASTATIC ADENOCARCINOMA  .  BREAST LUMPECTOMY Left 12/2014   CHEMO FIRST THEN SURGERY OF LN  . BREAST SURGERY     lymph node removal  . CHOLECYSTECTOMY N/A 04/19/2016   Procedure: LAPAROSCOPIC CHOLECYSTECTOMY WITH INTRAOPERATIVE CHOLANGIOGRAM;  Surgeon: Hubbard Robinson, MD;  Location: [facility];  Service: General;  Laterality: N/A;  . COLON RESECTION Right 01/10/2019    Procedure: HAND ASSISTED LAPAROSCOPIC RIGHT COLON RESECTION;  Surgeon: Jules Husbands, MD;  Location: [facility];  Service: General;  Laterality: Right;  . COLONOSCOPY WITH PROPOFOL N/A 12/30/2018   Procedure: COLONOSCOPY WITH PROPOFOL;  Surgeon: Lucilla Lame, MD;  Location: [facility];  Service: Endoscopy;  Laterality: N/A;  . medial branch block  11/06/2015   lumbar facet Dr. Primus Bravo  . POLYPECTOMY N/A 12/30/2018   Procedure: POLYPECTOMY;  Surgeon: Lucilla Lame, MD;  Location: [facility];  Service: Endoscopy;  Laterality: N/A;  Clips placed at Hepatic Flexure Polyp (2) and Transverse Colon Polyp (4) removal sites  . PORTACATH PLACEMENT Right 02/02/2019   Procedure: INSERTION PORT-A-CATH;  Surgeon: Jules Husbands, MD;  Location: [facility];  Service: General;  Laterality: Right;  . SENTINEL NODE BIOPSY Left 12/18/2014   Procedure: SENTINEL NODE BIOPSY;  Surgeon: Robert Bellow, MD;  Location: [facility];  Service: General;  Laterality: Left;    FAMILY HISTORY Family History  Problem Relation Age of Onset  . Breast cancer Sister 28  . Colon cancer Sister   . Colon cancer Mother        dx 39s  . Hypertension Mother   . Asthma Mother   . Cancer Father        voice box removed  . Cancer Maternal Grandmother        unsure type  . Colon cancer Cousin   . Cancer Cousin        unsure type       ADVANCED DIRECTIVES:    HEALTH MAINTENANCE: Social History   Tobacco Use  . Smoking status: Former Smoker    Packs/day: 0.50    Types: Cigarettes    Quit date: 07/18/2014    Years since quitting: 5.1  . Smokeless tobacco: Never Used  Substance Use Topics  . Alcohol use: No    Alcohol/week: 0.0 standard drinks  . Drug use: No    No Known Allergies  Current Outpatient Medications  Medication Sig Dispense Refill  . acetaminophen (TYLENOL) 500 MG tablet Take 500 mg by mouth every 6 (six) hours as needed for mild pain or moderate pain.     Marland Kitchen albuterol (PROVENTIL HFA;VENTOLIN  HFA) 108 (90 Base) MCG/ACT inhaler Inhale 2 puffs into the lungs every 6 (six) hours as needed for wheezing or shortness of breath. 1 Inhaler 2  . amLODipine (NORVASC) 5 MG tablet Take 5 mg by mouth daily. MD re-ordered at decreased dose    . apixaban (ELIQUIS) 5 MG TABS tablet Take 1 tablet (5 mg total) by mouth 2 (two) times daily. 60 tablet 3  . atorvastatin (LIPITOR) 10 MG tablet TAKE 1 TABLET BY MOUTH ONCE DAILY IN THE MORNING 90 tablet 0  . baclofen (LIORESAL) 10 MG tablet Take 1 tablet (10 mg total) by mouth 2 (two) times daily. (Patient taking differently: Take 10 mg by mouth 3 (three) times daily as needed for muscle spasms. ) 30 each 0  . cholecalciferol (VITAMIN D) 1000 units tablet Take 1,000 Units by mouth daily.    . diphenoxylate-atropine (LOMOTIL) 2.5-0.025 MG tablet Take 1 tablet by mouth 4 (  four) times daily as needed for diarrhea or loose stools. 30 tablet 0  . fluticasone (FLONASE) 50 MCG/ACT nasal spray Use 2 spray(s) in each nostril once daily 16 g 0  . gabapentin (NEURONTIN) 300 MG capsule Take 300-600 mg by mouth 4 (four) times daily as needed (neuropathic pain).     Marland Kitchen letrozole (FEMARA) 2.5 MG tablet Take 2.5 mg by mouth daily.    Marland Kitchen lidocaine-prilocaine (EMLA) cream Apply to affected area once 30 g 3  . NARCAN 4 MG/0.1ML LIQD nasal spray kit Place 1 spray into the nose.    . ondansetron (ZOFRAN ODT) 4 MG disintegrating tablet Take 1 tablet (4 mg total) by mouth every 8 (eight) hours as needed for nausea or vomiting. 20 tablet 0  . Oxycodone HCl 10 MG TABS LIMIT ONE HALF TO ONE TABLET BY MOUTH 3 TO 5 TIMES DAILY IF TOLERATED    . potassium chloride SA (KLOR-CON) 20 MEQ tablet Take 1 tablet (20 mEq total) by mouth 2 (two) times daily. 60 tablet 1  . pyridoxine (B-6) 100 MG tablet Take 100 mg by mouth daily.    . sertraline (ZOLOFT) 100 MG tablet Take 2 tablets by mouth once daily 180 tablet 0  . vitamin B-12 (CYANOCOBALAMIN) 500 MCG tablet Take 500 mcg by mouth daily.     No  current facility-administered medications for this visit.   Facility-Administered Medications Ordered in Other Visits  Medication Dose Route Frequency Provider Last Rate Last Admin  . heparin lock flush 100 unit/mL  500 Units Intravenous Once [name], MD      . heparin lock flush 100 unit/mL  500 Units Intravenous Once [name], MD      . heparin lock flush 100 unit/mL  500 Units Intracatheter Once PRN [name], MD      . sodium chloride flush (NS) 0.9 % injection 10 mL  10 mL Intravenous PRN [name], MD   10 mL at 02/27/19 0925  . sodium chloride flush (NS) 0.9 % injection 10 mL  10 mL Intravenous PRN [name], MD   10 mL at 09/11/19 0836    OBJECTIVE: Vitals:   09/11/19 0847 09/11/19 0854  BP: (!) 134/102 (!) 113/91  Pulse: 78 77  Resp: 16   Temp: (!) 96.7 F (35.9 C)   SpO2: 100%      Body mass index is 34.95 kg/m.    ECOG FS:1 - Symptomatic but completely ambulatory  General: Well-developed, well-nourished, no acute distress.  Sitting in a wheelchair. Eyes: Pink conjunctiva, anicteric sclera. HEENT: Normocephalic, moist mucous membranes. Lungs: No audible wheezing or coughing. Heart: Regular rate and rhythm. Abdomen: Soft, nontender, no obvious distention. Musculoskeletal: No edema, cyanosis, or clubbing. Neuro: Alert, answering all questions appropriately. Cranial nerves grossly intact. Skin: No rashes or petechiae noted. Psych: Normal affect.   LAB RESULTS:  Lab Results  Component Value Date   NA 140 09/11/2019   K 3.5 09/11/2019   CL 105 09/11/2019   CO2 26 09/11/2019   GLUCOSE 86 09/11/2019   BUN 10 09/11/2019   CREATININE 1.01 (H) 09/11/2019   CALCIUM 9.3 09/11/2019   PROT 6.8 09/11/2019   ALBUMIN 3.7 09/11/2019   AST 18 09/11/2019   ALT 13 09/11/2019   ALKPHOS 69 09/11/2019   BILITOT 0.6 09/11/2019   GFRNONAA 57 (L) 09/11/2019   GFRAA >60 09/11/2019    Lab Results  Component Value Date   WBC  6.4 09/11/2019  NEUTROABS 4.0 09/11/2019   HGB 10.8 (L) 09/11/2019   HCT 32.4 (L) 09/11/2019   MCV 85.7 09/11/2019   PLT 201 09/11/2019     STUDIES: No results found.  ASSESSMENT: Stage IIa ER+ left breast cancer with no breast lesion and axillary lymph node metastasis. (TxN1M0)  HER-2 negative.  Now with stage IIIa colon cancer.  PLAN:   1.  Stage IIIa colon cancer: Pathology report reviewed independently.  Patient's most recent imaging on August 07, 2019 with CT scans revealed no evidence of recurrent or progressive disease.  Previously, patient agreed to enroll in clinical trial.  Oxaliplatin was discontinued early secondary to worsening neuropathy.  Patient completed 12 cycles of chemotherapy on July 31, 2019.  Continue with maintenance Tecentriq for an additional 6 months.  Proceed with Tecentriq today.  Return to clinic in 2 weeks for further evaluation and continuation of treatment. 2. Stage IIa ER+ left breast cancer with no breast lesion and axillary lymph node metastasis: Despite no obvious breast lesion on mammogram or breast MRI, pathology and pattern of spread was consistent with primary breast cancer. CT and bone scan at time of diagnosis revealed no other evidence of malignancy.  She only received 3 cycles of Adriamycin and Cytoxan. Taxol was discontinued early as well secondary to persistent peripheral neuropathy. Patient completed her chemotherapy on Nov 19, 2014.  She elected not to pursue axillary node dissection given the potential morbidity of this procedure. It was also elected not to pursue adjuvant XRT given there was no primary breast lesion.  Patient completed approximately 4 years of treatment with letrozole, but this has been discontinued secondary to enrolling in clinical trial for her newly diagnosed colon cancer.  Her most recent mammogram on July 04, 2019 was reported as BI-RADS 1.  Repeat in December 2021. 3.  Bone health: Patient's most recent bone mineral  density on February 03, 2018 reported T score of -0.7 which is unchanged from 3 years prior.  Consider repeat bone mineral density in July 2021.  4.  Lynch syndrome: Genetic testing confirmed patient has Lynch syndrome.  She reports her sister and son both have tested positive.  She plans to talk to the rest of her children about genetic testing.  Appreciate genetics input. 5.  Endometrial cancer: Patient has had a total hysterectomy. 6.  Pain/Peripheral neuropathy: Grade 3.  Chronic and unchanged.  Agree with current medications from pain clinic.  Patient reports that she has an appointment with pain clinic tomorrow. 7. Anemia: Hemoglobin has trended down slightly and is now 10.8. 8.  Thrombocytopenia: Resolved. 9.  Renal insufficiency: Essentially resolved. 10.  Hypokalemia: Resolved.  Continue oral potassium supplementation 20 mEq twice per day. 11.  Diarrhea: Patient does not complain of this today.  Continue OTC Imodium as needed.  12.  Fatigue: Chronic and unchanged.  Patient has no new medications, she is anemic but this is relatively unchanged.  Thyroid studies are within normal limits.  She states her sleep habits are good.  Continue to monitor and consider a trial of dexamethasone or Remeron. 13.  Hypotension: Improved.  Continue Norvasc 5 mg daily. 14.  Port associated clot: Continue Eliquis as prescribed for a total of 3 months completing treatment in March 2021.   Patient expressed understanding and was in agreement with this plan. She also understands that She can call clinic at any time with any questions, concerns, or complaints.   Breast cancer metastasized to axillary lymph node   Staging form: Breast,  AJCC 7th Edition     Clinical stage from 10/22/2014: Stage Unknown ([place], N1, M0) - Signed by [name], MD on 10/22/2014   [name], MD   09/11/2019 12:16 PM

## 2019-09-11 ENCOUNTER — Encounter: Payer: Self-pay | Admitting: Oncology

## 2019-09-11 ENCOUNTER — Encounter: Payer: Self-pay | Admitting: *Deleted

## 2019-09-11 ENCOUNTER — Inpatient Hospital Stay: Payer: Medicare HMO | Attending: Oncology

## 2019-09-11 ENCOUNTER — Other Ambulatory Visit: Payer: Self-pay

## 2019-09-11 ENCOUNTER — Inpatient Hospital Stay (HOSPITAL_BASED_OUTPATIENT_CLINIC_OR_DEPARTMENT_OTHER): Payer: Medicare HMO | Admitting: Oncology

## 2019-09-11 ENCOUNTER — Inpatient Hospital Stay: Payer: Medicare HMO

## 2019-09-11 VITALS — BP 113/91 | HR 77 | Temp 96.7°F | Resp 16 | Wt 197.3 lb

## 2019-09-11 VITALS — BP 124/86 | HR 67 | Temp 96.8°F | Resp 18

## 2019-09-11 DIAGNOSIS — G620 Drug-induced polyneuropathy: Secondary | ICD-10-CM | POA: Diagnosis not present

## 2019-09-11 DIAGNOSIS — Z8542 Personal history of malignant neoplasm of other parts of uterus: Secondary | ICD-10-CM

## 2019-09-11 DIAGNOSIS — Z87891 Personal history of nicotine dependence: Secondary | ICD-10-CM | POA: Diagnosis not present

## 2019-09-11 DIAGNOSIS — I959 Hypotension, unspecified: Secondary | ICD-10-CM

## 2019-09-11 DIAGNOSIS — Z7901 Long term (current) use of anticoagulants: Secondary | ICD-10-CM | POA: Diagnosis not present

## 2019-09-11 DIAGNOSIS — Z006 Encounter for examination for normal comparison and control in clinical research program: Secondary | ICD-10-CM

## 2019-09-11 DIAGNOSIS — D649 Anemia, unspecified: Secondary | ICD-10-CM | POA: Diagnosis not present

## 2019-09-11 DIAGNOSIS — Z800 Family history of malignant neoplasm of digestive organs: Secondary | ICD-10-CM

## 2019-09-11 DIAGNOSIS — R531 Weakness: Secondary | ICD-10-CM

## 2019-09-11 DIAGNOSIS — M5430 Sciatica, unspecified side: Secondary | ICD-10-CM

## 2019-09-11 DIAGNOSIS — C184 Malignant neoplasm of transverse colon: Secondary | ICD-10-CM

## 2019-09-11 DIAGNOSIS — T451X5A Adverse effect of antineoplastic and immunosuppressive drugs, initial encounter: Secondary | ICD-10-CM | POA: Insufficient documentation

## 2019-09-11 DIAGNOSIS — Z803 Family history of malignant neoplasm of breast: Secondary | ICD-10-CM | POA: Insufficient documentation

## 2019-09-11 DIAGNOSIS — M549 Dorsalgia, unspecified: Secondary | ICD-10-CM | POA: Insufficient documentation

## 2019-09-11 DIAGNOSIS — C189 Malignant neoplasm of colon, unspecified: Secondary | ICD-10-CM | POA: Insufficient documentation

## 2019-09-11 DIAGNOSIS — Z86718 Personal history of other venous thrombosis and embolism: Secondary | ICD-10-CM

## 2019-09-11 DIAGNOSIS — Z90710 Acquired absence of both cervix and uterus: Secondary | ICD-10-CM | POA: Insufficient documentation

## 2019-09-11 DIAGNOSIS — Z808 Family history of malignant neoplasm of other organs or systems: Secondary | ICD-10-CM | POA: Insufficient documentation

## 2019-09-11 DIAGNOSIS — Z79899 Other long term (current) drug therapy: Secondary | ICD-10-CM | POA: Diagnosis not present

## 2019-09-11 DIAGNOSIS — Z809 Family history of malignant neoplasm, unspecified: Secondary | ICD-10-CM | POA: Insufficient documentation

## 2019-09-11 DIAGNOSIS — Z1509 Genetic susceptibility to other malignant neoplasm: Secondary | ICD-10-CM | POA: Diagnosis not present

## 2019-09-11 DIAGNOSIS — R5383 Other fatigue: Secondary | ICD-10-CM | POA: Insufficient documentation

## 2019-09-11 DIAGNOSIS — Z9221 Personal history of antineoplastic chemotherapy: Secondary | ICD-10-CM | POA: Insufficient documentation

## 2019-09-11 DIAGNOSIS — G8929 Other chronic pain: Secondary | ICD-10-CM | POA: Insufficient documentation

## 2019-09-11 LAB — CBC WITH DIFFERENTIAL/PLATELET
Abs Immature Granulocytes: 0.04 10*3/uL (ref 0.00–0.07)
Basophils Absolute: 0 10*3/uL (ref 0.0–0.1)
Basophils Relative: 1 %
Eosinophils Absolute: 0.2 10*3/uL (ref 0.0–0.5)
Eosinophils Relative: 2 %
HCT: 32.4 % — ABNORMAL LOW (ref 36.0–46.0)
Hemoglobin: 10.8 g/dL — ABNORMAL LOW (ref 12.0–15.0)
Immature Granulocytes: 1 %
Lymphocytes Relative: 27 %
Lymphs Abs: 1.7 10*3/uL (ref 0.7–4.0)
MCH: 28.6 pg (ref 26.0–34.0)
MCHC: 33.3 g/dL (ref 30.0–36.0)
MCV: 85.7 fL (ref 80.0–100.0)
Monocytes Absolute: 0.5 10*3/uL (ref 0.1–1.0)
Monocytes Relative: 8 %
Neutro Abs: 4 10*3/uL (ref 1.7–7.7)
Neutrophils Relative %: 61 %
Platelets: 201 10*3/uL (ref 150–400)
RBC: 3.78 MIL/uL — ABNORMAL LOW (ref 3.87–5.11)
RDW: 16.4 % — ABNORMAL HIGH (ref 11.5–15.5)
WBC: 6.4 10*3/uL (ref 4.0–10.5)
nRBC: 0 % (ref 0.0–0.2)

## 2019-09-11 LAB — COMPREHENSIVE METABOLIC PANEL
ALT: 13 U/L (ref 0–44)
AST: 18 U/L (ref 15–41)
Albumin: 3.7 g/dL (ref 3.5–5.0)
Alkaline Phosphatase: 69 U/L (ref 38–126)
Anion gap: 9 (ref 5–15)
BUN: 10 mg/dL (ref 8–23)
CO2: 26 mmol/L (ref 22–32)
Calcium: 9.3 mg/dL (ref 8.9–10.3)
Chloride: 105 mmol/L (ref 98–111)
Creatinine, Ser: 1.01 mg/dL — ABNORMAL HIGH (ref 0.44–1.00)
GFR calc Af Amer: >60 mL/min (ref >60)
GFR calc non Af Amer: 57 mL/min — ABNORMAL LOW (ref >60)
Glucose, Bld: 86 mg/dL (ref 70–99)
Potassium: 3.5 mmol/L (ref 3.5–5.1)
Sodium: 140 mmol/L (ref 135–145)
Total Bilirubin: 0.6 mg/dL (ref 0.3–1.2)
Total Protein: 6.8 g/dL (ref 6.5–8.1)

## 2019-09-11 MED ORDER — HEPARIN SOD (PORK) LOCK FLUSH 100 UNIT/ML IV SOLN
500.0000 [IU] | Freq: Once | INTRAVENOUS | Status: DC | PRN
  Filled 2019-09-11: qty 5

## 2019-09-11 MED ORDER — SODIUM CHLORIDE 0.9 % IV SOLN
Freq: Once | INTRAVENOUS | Status: AC
  Filled 2019-09-11: qty 250

## 2019-09-11 MED ORDER — SODIUM CHLORIDE 0.9% FLUSH
10.0000 mL | INTRAVENOUS | Status: DC | PRN
  Administered 2019-09-11: 10 mL via INTRAVENOUS
  Filled 2019-09-11: qty 10

## 2019-09-11 MED ORDER — SODIUM CHLORIDE 0.9 % IV SOLN
840.0000 mg | Freq: Once | INTRAVENOUS | Status: AC
  Administered 2019-09-11: 840 mg via INTRAVENOUS
  Filled 2019-09-11: qty 14

## 2019-09-11 MED ORDER — HEPARIN SOD (PORK) LOCK FLUSH 100 UNIT/ML IV SOLN
INTRAVENOUS | Status: AC
  Filled 2019-09-11: qty 5

## 2019-09-11 MED ORDER — HEPARIN SOD (PORK) LOCK FLUSH 100 UNIT/ML IV SOLN
500.0000 [IU] | Freq: Once | INTRAVENOUS | Status: AC
  Administered 2019-09-11: 500 [IU] via INTRAVENOUS
  Filled 2019-09-11: qty 5

## 2019-09-11 NOTE — Research (Signed)
Patient Carrie Alvarez presents to clinic this morning for consideration her C15D1 Huey Bienenstock only treatment on the [id number] [name] study. Patient reports neuropathy in fingers and bil. Feet/toes remains about the same today and states she is continuing to have increased sciatic type pain radiating down the left side of her back and left leg to her foot - currently 8/10 on pain scale. She is currently being seen by pain management clinic for this and was supposed to receive an injection in her back last week, but there was a problem with her insurance. States she will see pain management tomorrow and hopefully get this straightened out so she can get the injection approved and get this pain under control. Patient states she is experiencing a little less fatigue, but now the pain will not allow her to be more active. States she has been taking the Eliquis twice daily, and has taken Norvasc 10mg  - 1/2 tab daily since she was instructed to do this 2 weeks ago, but reports her pharmacy has never called her to let her know the 5mg  prescription is ready. Her B/P has improved to 113/91. She is afebrile and SpO2 remains at 100% on room air while wearing a mask. Reports she is now only having one bowel movement per day, but her appetite remains poor. Local labs were collected via port-a-cath this morning and CBC and chemistry values were reviewed by Dr. Grayland Ormond. Patient denies any abdominal pain at present, but continues to experience occasional soreness at the incision site, and has increased abdominal pain with bowel movements. States the pain is intermittant and does not interfere with her ADLs. Solicited adverse events were assessed by this RN today, and she completed the PRO-CTCAE booklet after Dr. Grayland Ormond saw patient and determined she could proceed with her atezolizumab infusion today. Patient remains in a wheelchair while in clinic due to her ongoing issues with leg pain and fatigue, but states she is fully  ambulatory and uses a cane while at home. Reports tenderness in the palms of her hands and the bottoms of her feet continues, but her palm areas are less dark this morning and she agrees this may have improved just a little. No weight loss noted. Potassium remains wnl at 3.5 mg/dL - and patient voices compliance to potassium supplements.  Serum creatinine level is barely elevated today at at 1.01mg /dL, and CrCl calculation is 42mL/min. Other labs are WNL for patient to receive her treatment, H&P completed by Dr. Grayland Ormond. Patient will receive C15 Atezolizumab as scheduled today. Solicited and other Adverse events with grade and attribution are as noted below.   Adverse Event Log  Study/Protocol: [id number] [name] Cycle: Cycle 14 Event Grade Onset Date Resolved Date Drug Name Attribution Treatment Comments  Diarrhea 1 08/25/2019 05/18/2019 01/10/2019 09/08/2019 07/14/2019 05/02/2019 n/a surgery Imodium  Reports only having one stool per day now   Abdominal pain 2 04/08/2019 01/10/2019 04/12/2019 04/08/2019 n/a surgery Oxycodone Reports pain resolved  Constipation 0      Denies  Nausea 1 06/07/2019 06/14/2019    Denies further nausea  Fatigue 2 06/12/2018 approx   possible  Reports no energy in past 12 months  Anorexia 1 07/13/2008   unrelated  States this is her norm for several years  Cough 0        Dyspnea 0        Fever 1 06/07/2019 06/07/2019    Temp 99.6 in ED  UTI 0        Palmer-plantar erythrodesia 1 03/13/2019  otc cream Bilateral palms & anterior fingers remain discolored & more tender today  Total biliruben 0        Neutrophils 0        TSH 0      No history of hypothyroidism  Sensory Peripheral Neuropathy 3 06/13/2019 07/10/2019  Oxaliplatin Oxaliplatin discontinued Continuous, affecting ability to grasp objects & ambulation, ADLs  Sensory Peripheral Neuropathy 2 07/11/2019 11/19/2014  06/13/2019  Paclitaxel Plus Oxaliplatin gabapentin Numbness & Tingling in fingertips, toes &  feet bil. since taking Taxol in 2014  Hypokalemia 1 08/14/2019 07/17/2019 03/27/2019 02/27/2019 08/28/2019 07/31/2019 04/17/2019 03/13/2019  probable none K+ is 3.7 mg/dL today  Anemia 1 08/28/2019 05/01/2019  06/26/2019    Hgb 10.8 today  Anemia 2 06/26/2019 02/27/2019 08/28/2019 04/17/2019  probable none Hgb 11.4 today  Elevated creatinine 1 08/28/2019 02/07/2019  08/14/2019  unrelated  Elevated at baseline - now 1.01 today  Platelet count   decreased 1 05/29/2019 03/27/2019 07/17/2019 04/17/2019  definitely none Plts  211,000 today  Possible Colitis 2 04/08/2019 04/12/2019  probable Steroids Resolved  Weight Loss 1 04/10/2019 04/17/2019  probable none Weight back to normal    Proteinuria 1 05/15/2019    none Urine dipstick only  Abdominal pain 1 05/22/2019   unlikely  Occasional incisional soreness  Hypokalemia 3 05/29/2019 06/26/2019  probable IV + oral K+ K+ is 2.9 mmol/L today  vomiting 1 06/06/2019 06/07/2019  unlikely  Resolved with IVFs  somnolence 2 06/05/2019 06/08/2019  unlikely    headache 2 06/05/2019 06/07/2019  unlikely  Had Toradol in ED  Port associated  thrombosis 3 07/10/2019   unlikely Eliquis Pt reported pain in right side of neck at port site  Hypotension 2 06/12/2019 07/17/2019  possible IV fluids Norvasc on hold  Chronic back & left leg pain 2 07/13/2017   unrelated  Reports 8/10 on pain scale this morning  [name], BSN, MHA, OCN 09/11/2019 10:11 AM

## 2019-09-11 NOTE — Progress Notes (Signed)
Patient reports she is having left 8/10 and the pain med is not helping control the pain.

## 2019-09-12 DIAGNOSIS — M5416 Radiculopathy, lumbar region: Secondary | ICD-10-CM | POA: Diagnosis not present

## 2019-09-12 LAB — CEA: CEA: 5.5 ng/mL — ABNORMAL HIGH (ref 0.0–4.7)

## 2019-09-20 DIAGNOSIS — M5416 Radiculopathy, lumbar region: Secondary | ICD-10-CM | POA: Diagnosis not present

## 2019-09-21 NOTE — Progress Notes (Signed)
[facility]  [phone] [phone]) [phone]  ID: Norm Salt OB: 10-13-1950  MR#: [id number]  QPR#:[id number]  Patient Care Team: Paulene Floor as PCP - General (Physician Assistant) [name], MD as Consulting Physician (Oncology) [name], MD as Attending Physician (Pain Medicine) [name]., MD as Consulting Physician (Ophthalmology) [name], RN as Oncology Nurse Navigator  CHIEF COMPLAINT: Stage IIa ER+ left breast cancer with no breast lesion and axillary lymph node metastasis.  Now with stage IIIa colon cancer.  INTERVAL HISTORY: Patient returns to clinic today for further evaluation and her next infusion of maintenance Tecentriq on clinical trial.  Her back pain and sciatica is still present, but improved after receiving an injection approximately 2 weeks ago.  Her peripheral neuropathy is unchanged. She has no other neurologic complaints.  She has chronic weakness and fatigue. She denies any recent fevers or illnesses.  She has no chest pain, shortness of breath, cough, or hemoptysis.  She denies any nausea, vomiting, or constipation.  She reports her diarrhea has resolved.  She has no melena or hematochezia.  She has no urinary complaints.  Patient offers no further specific complaints today.  REVIEW OF SYSTEMS:   Review of Systems  Constitutional: Positive for malaise/fatigue. Negative for fever and weight loss.  Respiratory: Negative.  Negative for cough and shortness of breath.   Cardiovascular: Negative.  Negative for chest pain and leg swelling.  Gastrointestinal: Negative.  Negative for abdominal pain, blood in stool, constipation, diarrhea, melena, nausea and vomiting.  Genitourinary: Negative.  Negative for dysuria.  Musculoskeletal: Positive for back pain. Negative for neck pain.  Skin: Negative.  Negative for rash.  Neurological: Positive for tingling, sensory change and weakness. Negative  for dizziness, focal weakness and headaches.  Psychiatric/Behavioral: Negative.  Negative for depression. The patient is not nervous/anxious.     As per HPI. Otherwise, a complete review of systems is negative.  PAST MEDICAL HISTORY: Past Medical History:  Diagnosis Date  . Allergy   . Anemia   . Back pain   . Breast cancer ([place]) 2015   LT LUMPECTOMY 12-2014 FOLLOWING CHEMO  . Carpal tunnel syndrome    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  . Cataract   . Colon cancer ([place]) 01/2019  . Depression   . Family history of breast cancer   . Family history of colon cancer   . GERD (gastroesophageal reflux disease)    problem with reflux during chemo  . History of methicillin resistant staphylococcus aureus (MRSA)   . Hypercholesteremia   . Hypertension   . Lumbosacral pain    sees pain management in [place]  . Obesity   . Personal history of chemotherapy 2016   left breast ca  . Personal history of chemotherapy 2020   colon ca  . Pinched nerve    left side, lumbar area  . Uterine cancer ([place]) 1994    PAST SURGICAL HISTORY: Past Surgical History:  Procedure Laterality Date  . ABDOMINAL HYSTERECTOMY  1994   UTERINE CA  . AXILLARY LYMPH NODE BIOPSY Left 12/18/2014   Procedure: AXILLARY LYMPH NODE BIOPSY/;  Surgeon: Robert Bellow, MD;  Location: [facility];  Service: General;  Laterality: Left;  . AXILLARY LYMPH NODE DISSECTION Left 12/18/2014   Procedure: AXILLARY LYMPH NODE DISSECTION;  Surgeon: Robert Bellow, MD;  Location: [facility];  Service: General;  Laterality: Left;  . BREAST BIOPSY Left 05/2014   CORE BX OF LN,  METASTATIC ADENOCARCINOMA  . BREAST LUMPECTOMY Left 12/2014   CHEMO FIRST THEN SURGERY OF LN  . BREAST SURGERY     lymph node removal  . CHOLECYSTECTOMY N/A 04/19/2016   Procedure: LAPAROSCOPIC CHOLECYSTECTOMY WITH INTRAOPERATIVE CHOLANGIOGRAM;  Surgeon: Hubbard Robinson, MD;  Location: [facility];  Service: General;  Laterality: N/A;  . COLON RESECTION  Right 01/10/2019   Procedure: HAND ASSISTED LAPAROSCOPIC RIGHT COLON RESECTION;  Surgeon: Jules Husbands, MD;  Location: [facility];  Service: General;  Laterality: Right;  . COLONOSCOPY WITH PROPOFOL N/A 12/30/2018   Procedure: COLONOSCOPY WITH PROPOFOL;  Surgeon: Lucilla Lame, MD;  Location: [facility];  Service: Endoscopy;  Laterality: N/A;  . medial branch block  11/06/2015   lumbar facet Dr. Primus Bravo  . POLYPECTOMY N/A 12/30/2018   Procedure: POLYPECTOMY;  Surgeon: Lucilla Lame, MD;  Location: [place];  Service: Endoscopy;  Laterality: N/A;  Clips placed at Hepatic Flexure Polyp (2) and Transverse Colon Polyp (4) removal sites  . PORTACATH PLACEMENT Right 02/02/2019   Procedure: INSERTION PORT-A-CATH;  Surgeon: Jules Husbands, MD;  Location: [facility];  Service: General;  Laterality: Right;  . SENTINEL NODE BIOPSY Left 12/18/2014   Procedure: SENTINEL NODE BIOPSY;  Surgeon: Robert Bellow, MD;  Location: [facility];  Service: General;  Laterality: Left;    FAMILY HISTORY Family History  Problem Relation Age of Onset  . Breast cancer Sister 55  . Colon cancer Sister   . Colon cancer Mother        dx 28s  . Hypertension Mother   . Asthma Mother   . Cancer Father        voice box removed  . Cancer Maternal Grandmother        unsure type  . Colon cancer Cousin   . Cancer Cousin        unsure type       ADVANCED DIRECTIVES:    HEALTH MAINTENANCE: Social History   Tobacco Use  . Smoking status: Former Smoker    Packs/day: 0.50    Types: Cigarettes    Quit date: 07/18/2014    Years since quitting: 5.1  . Smokeless tobacco: Never Used  Substance Use Topics  . Alcohol use: No    Alcohol/week: 0.0 standard drinks  . Drug use: No    No Known Allergies  Current Outpatient Medications  Medication Sig Dispense Refill  . acetaminophen (TYLENOL) 500 MG tablet Take 500 mg by mouth every 6 (six) hours as needed for mild pain or moderate pain.     Marland Kitchen albuterol  (PROVENTIL HFA;VENTOLIN HFA) 108 (90 Base) MCG/ACT inhaler Inhale 2 puffs into the lungs every 6 (six) hours as needed for wheezing or shortness of breath. 1 Inhaler 2  . amLODipine (NORVASC) 5 MG tablet Take 1 tablet (5 mg total) by mouth daily. MD re-ordered at decreased dose 30 tablet 5  . apixaban (ELIQUIS) 5 MG TABS tablet Take 1 tablet (5 mg total) by mouth 2 (two) times daily. 60 tablet 3  . atorvastatin (LIPITOR) 10 MG tablet TAKE 1 TABLET BY MOUTH ONCE DAILY IN THE MORNING 90 tablet 0  . baclofen (LIORESAL) 10 MG tablet Take 1 tablet (10 mg total) by mouth 2 (two) times daily. (Patient taking differently: Take 10 mg by mouth 3 (three) times daily as needed for muscle spasms. ) 30 each 0  . cholecalciferol (VITAMIN D) 1000 units tablet Take 1,000 Units by mouth daily.    . fluticasone (FLONASE) 50  MCG/ACT nasal spray Use 2 spray(s) in each nostril once daily 16 g 0  . gabapentin (NEURONTIN) 300 MG capsule Take 300-600 mg by mouth 4 (four) times daily as needed (neuropathic pain).     Marland Kitchen letrozole (FEMARA) 2.5 MG tablet Take 2.5 mg by mouth daily.    Marland Kitchen lidocaine-prilocaine (EMLA) cream Apply to affected area once 30 g 3  . NARCAN 4 MG/0.1ML LIQD nasal spray kit Place 1 spray into the nose.    Marland Kitchen Oxycodone HCl 10 MG TABS LIMIT ONE HALF TO ONE TABLET BY MOUTH 3 TO 5 TIMES DAILY IF TOLERATED    . pyridoxine (B-6) 100 MG tablet Take 100 mg by mouth daily.    . sertraline (ZOLOFT) 100 MG tablet Take 2 tablets by mouth once daily 180 tablet 0  . vitamin B-12 (CYANOCOBALAMIN) 500 MCG tablet Take 500 mcg by mouth daily.    . potassium chloride SA (KLOR-CON) 20 MEQ tablet Take 1 tablet (20 mEq total) by mouth 2 (two) times daily. (Patient not taking: Reported on 09/25/2019) 60 tablet 1   No current facility-administered medications for this visit.   Facility-Administered Medications Ordered in Other Visits  Medication Dose Route Frequency Provider Last Rate Last Admin  . heparin lock flush 100  unit/mL  500 Units Intravenous Once [name], MD      . heparin lock flush 100 unit/mL  500 Units Intravenous Once [name], MD      . heparin lock flush 100 unit/mL  500 Units Intracatheter Once PRN [name], MD      . INV-atezolizumab [facility] [phone] (TECENTRIQ) 840 mg in sodium chloride 0.9 % 250 mL chemo infusion  840 mg Intravenous Once [name], MD      . sodium chloride flush (NS) 0.9 % injection 10 mL  10 mL Intravenous PRN [name], MD   10 mL at 02/27/19 0925    OBJECTIVE: Vitals:   09/25/19 0845  BP: 106/80  Pulse: 75  Temp: (!) 94.7 F (34.8 C)     Body mass index is 33.83 kg/m.    ECOG FS:1 - Symptomatic but completely ambulatory  General: Well-developed, well-nourished, no acute distress.  Sitting in a wheelchair. Eyes: Pink conjunctiva, anicteric sclera. HEENT: Normocephalic, moist mucous membranes. Lungs: No audible wheezing or coughing. Heart: Regular rate and rhythm. Abdomen: Soft, nontender, no obvious distention. Musculoskeletal: No edema, cyanosis, or clubbing. Neuro: Alert, answering all questions appropriately. Cranial nerves grossly intact. Skin: No rashes or petechiae noted. Psych: Normal affect.   LAB RESULTS:  Lab Results  Component Value Date   NA 139 09/25/2019   K 3.3 (L) 09/25/2019   CL 108 09/25/2019   CO2 24 09/25/2019   GLUCOSE 103 (H) 09/25/2019   BUN 14 09/25/2019   CREATININE 1.08 (H) 09/25/2019   CALCIUM 9.5 09/25/2019   PROT 7.2 09/25/2019   ALBUMIN 3.8 09/25/2019   AST 17 09/25/2019   ALT 16 09/25/2019   ALKPHOS 75 09/25/2019   BILITOT 1.0 09/25/2019   GFRNONAA 52 (L) 09/25/2019   GFRAA >60 09/25/2019    Lab Results  Component Value Date   WBC 8.1 09/25/2019   NEUTROABS 5.7 09/25/2019   HGB 11.7 (L) 09/25/2019   HCT 33.3 (L) 09/25/2019   MCV 82.2 09/25/2019   PLT 225 09/25/2019     STUDIES: No results found.  ASSESSMENT: Stage IIa ER+ left breast cancer with  no breast lesion and axillary lymph node metastasis. (TxN1M0)  HER-2 negative.  Now with stage IIIa colon cancer.  PLAN:   1.  Stage IIIa colon cancer: Pathology report reviewed independently.  Patient's most recent imaging on August 07, 2019 with CT scans revealed no evidence of recurrent or progressive disease.  Previously, patient agreed to enroll in clinical trial.  Oxaliplatin was discontinued early secondary to worsening neuropathy.  Patient completed 12 cycles of chemotherapy on July 31, 2019.  Continue with maintenance Tecentriq for an additional 6 months.  Proceed with Tecentriq today.  Return to clinic in 2 weeks for further evaluation and continuation of treatment. 2. Stage IIa ER+ left breast cancer with no breast lesion and axillary lymph node metastasis: Despite no obvious breast lesion on mammogram or breast MRI, pathology and pattern of spread was consistent with primary breast cancer. CT and bone scan at time of diagnosis revealed no other evidence of malignancy.  She only received 3 cycles of Adriamycin and Cytoxan. Taxol was discontinued early as well secondary to persistent peripheral neuropathy. Patient completed her chemotherapy on Nov 19, 2014.  She elected not to pursue axillary node dissection given the potential morbidity of this procedure. It was also elected not to pursue adjuvant XRT given there was no primary breast lesion.  Patient completed approximately 4 years of treatment with letrozole, but this was discontinued secondary to enrolling in clinical trial for her newly diagnosed colon cancer.  Her most recent mammogram on July 04, 2019 was reported as BI-RADS 1.  Repeat in December 2021. 3.  Bone health: Patient's most recent bone mineral density on February 03, 2018 reported T score of -0.7 which is unchanged from 3 years prior.  Consider repeat bone mineral density in July 2021.  4.  Lynch syndrome: Genetic testing confirmed patient has Lynch syndrome.  She reports her  sister and son both have tested positive.  She plans to talk to the rest of her children about genetic testing.  Appreciate genetics input. 5.  Endometrial cancer: Patient has had a total hysterectomy. 6.  Pain/Peripheral neuropathy: Grade 3.  Chronic and unchanged.  Agree with current medications from pain clinic.  Continue injections per pain clinic as needed. 7. Anemia: Improved.  Patient's hemoglobin is now 11.7. 8.  Thrombocytopenia: Resolved. 9.  Renal insufficiency: Mild.  Patient's creatinine is 1.08. 10.  Hypokalemia: Potassium has trended down slightly.  Patient states she does not want a refill on her potassium supplements and would rather make dietary changes. 11.  Diarrhea: Patient does not complain of this today.  Continue OTC Imodium as needed.  12.  Fatigue: Chronic and unchanged.  Patient has no new medications, she is anemic but this is relatively unchanged.  Thyroid studies are within normal limits.  She states her sleep habits are good.  Continue to monitor and consider a trial of dexamethasone or Remeron. 13.  Hypotension: Improved.  Continue Norvasc 5 mg daily. 14.  Port associated clot: Continue Eliquis as prescribed for a total of 3 months completing treatment in March 2021.   Patient expressed understanding and was in agreement with this plan. She also understands that She can call clinic at any time with any questions, concerns, or complaints.   Breast cancer metastasized to axillary lymph node   Staging form: Breast, AJCC 7th Edition     Clinical stage from 10/22/2014: Stage Unknown (TX, N1, M0) - Signed by [name], MD on 10/22/2014   [name], MD   09/25/2019 9:50 AM

## 2019-09-25 ENCOUNTER — Inpatient Hospital Stay: Payer: Medicare HMO

## 2019-09-25 ENCOUNTER — Encounter: Payer: Self-pay | Admitting: Oncology

## 2019-09-25 ENCOUNTER — Inpatient Hospital Stay (HOSPITAL_BASED_OUTPATIENT_CLINIC_OR_DEPARTMENT_OTHER): Payer: Medicare HMO | Admitting: Oncology

## 2019-09-25 ENCOUNTER — Encounter: Payer: Self-pay | Admitting: *Deleted

## 2019-09-25 ENCOUNTER — Other Ambulatory Visit: Payer: Self-pay

## 2019-09-25 VITALS — BP 114/77 | HR 62 | Temp 96.1°F | Resp 18

## 2019-09-25 VITALS — BP 106/80 | HR 75 | Temp 94.7°F | Wt 191.0 lb

## 2019-09-25 DIAGNOSIS — Z8542 Personal history of malignant neoplasm of other parts of uterus: Secondary | ICD-10-CM

## 2019-09-25 DIAGNOSIS — I959 Hypotension, unspecified: Secondary | ICD-10-CM

## 2019-09-25 DIAGNOSIS — G620 Drug-induced polyneuropathy: Secondary | ICD-10-CM | POA: Diagnosis not present

## 2019-09-25 DIAGNOSIS — C184 Malignant neoplasm of transverse colon: Secondary | ICD-10-CM

## 2019-09-25 DIAGNOSIS — D649 Anemia, unspecified: Secondary | ICD-10-CM | POA: Diagnosis not present

## 2019-09-25 DIAGNOSIS — Z7901 Long term (current) use of anticoagulants: Secondary | ICD-10-CM

## 2019-09-25 DIAGNOSIS — Z90710 Acquired absence of both cervix and uterus: Secondary | ICD-10-CM

## 2019-09-25 DIAGNOSIS — G8929 Other chronic pain: Secondary | ICD-10-CM

## 2019-09-25 DIAGNOSIS — Z1509 Genetic susceptibility to other malignant neoplasm: Secondary | ICD-10-CM

## 2019-09-25 DIAGNOSIS — Z006 Encounter for examination for normal comparison and control in clinical research program: Secondary | ICD-10-CM

## 2019-09-25 DIAGNOSIS — R5383 Other fatigue: Secondary | ICD-10-CM

## 2019-09-25 DIAGNOSIS — Z86718 Personal history of other venous thrombosis and embolism: Secondary | ICD-10-CM

## 2019-09-25 DIAGNOSIS — M5430 Sciatica, unspecified side: Secondary | ICD-10-CM | POA: Diagnosis not present

## 2019-09-25 DIAGNOSIS — Z9221 Personal history of antineoplastic chemotherapy: Secondary | ICD-10-CM

## 2019-09-25 DIAGNOSIS — M549 Dorsalgia, unspecified: Secondary | ICD-10-CM | POA: Diagnosis not present

## 2019-09-25 DIAGNOSIS — C189 Malignant neoplasm of colon, unspecified: Secondary | ICD-10-CM

## 2019-09-25 DIAGNOSIS — R531 Weakness: Secondary | ICD-10-CM

## 2019-09-25 DIAGNOSIS — Z87891 Personal history of nicotine dependence: Secondary | ICD-10-CM

## 2019-09-25 DIAGNOSIS — Z79899 Other long term (current) drug therapy: Secondary | ICD-10-CM | POA: Diagnosis present

## 2019-09-25 LAB — COMPREHENSIVE METABOLIC PANEL
ALT: 16 U/L (ref 0–44)
AST: 17 U/L (ref 15–41)
Albumin: 3.8 g/dL (ref 3.5–5.0)
Alkaline Phosphatase: 75 U/L (ref 38–126)
Anion gap: 7 (ref 5–15)
BUN: 14 mg/dL (ref 8–23)
CO2: 24 mmol/L (ref 22–32)
Calcium: 9.5 mg/dL (ref 8.9–10.3)
Chloride: 108 mmol/L (ref 98–111)
Creatinine, Ser: 1.08 mg/dL — ABNORMAL HIGH (ref 0.44–1.00)
GFR calc Af Amer: >60 mL/min (ref >60)
GFR calc non Af Amer: 52 mL/min — ABNORMAL LOW (ref >60)
Glucose, Bld: 103 mg/dL — ABNORMAL HIGH (ref 70–99)
Potassium: 3.3 mmol/L — ABNORMAL LOW (ref 3.5–5.1)
Sodium: 139 mmol/L (ref 135–145)
Total Bilirubin: 1 mg/dL (ref 0.3–1.2)
Total Protein: 7.2 g/dL (ref 6.5–8.1)

## 2019-09-25 LAB — CBC WITH DIFFERENTIAL/PLATELET
Abs Immature Granulocytes: 0.03 10*3/uL (ref 0.00–0.07)
Basophils Absolute: 0 10*3/uL (ref 0.0–0.1)
Basophils Relative: 1 %
Eosinophils Absolute: 0.1 10*3/uL (ref 0.0–0.5)
Eosinophils Relative: 2 %
HCT: 33.3 % — ABNORMAL LOW (ref 36.0–46.0)
Hemoglobin: 11.7 g/dL — ABNORMAL LOW (ref 12.0–15.0)
Immature Granulocytes: 0 %
Lymphocytes Relative: 22 %
Lymphs Abs: 1.8 10*3/uL (ref 0.7–4.0)
MCH: 28.9 pg (ref 26.0–34.0)
MCHC: 35.1 g/dL (ref 30.0–36.0)
MCV: 82.2 fL (ref 80.0–100.0)
Monocytes Absolute: 0.5 10*3/uL (ref 0.1–1.0)
Monocytes Relative: 6 %
Neutro Abs: 5.7 10*3/uL (ref 1.7–7.7)
Neutrophils Relative %: 69 %
Platelets: 225 10*3/uL (ref 150–400)
RBC: 4.05 MIL/uL (ref 3.87–5.11)
RDW: 15.4 % (ref 11.5–15.5)
WBC: 8.1 10*3/uL (ref 4.0–10.5)
nRBC: 0 % (ref 0.0–0.2)

## 2019-09-25 MED ORDER — HEPARIN SOD (PORK) LOCK FLUSH 100 UNIT/ML IV SOLN
500.0000 [IU] | Freq: Once | INTRAVENOUS | Status: AC | PRN
  Administered 2019-09-25: 500 [IU]
  Filled 2019-09-25: qty 5

## 2019-09-25 MED ORDER — HEPARIN SOD (PORK) LOCK FLUSH 100 UNIT/ML IV SOLN
INTRAVENOUS | Status: AC
  Filled 2019-09-25: qty 5

## 2019-09-25 MED ORDER — SODIUM CHLORIDE 0.9 % IV SOLN
Freq: Once | INTRAVENOUS | Status: AC
  Filled 2019-09-25: qty 250

## 2019-09-25 MED ORDER — AMLODIPINE BESYLATE 5 MG PO TABS: 5.0000 mg | ORAL_TABLET | Freq: Every day | ORAL | 5 refills | Status: DC

## 2019-09-25 MED ORDER — SODIUM CHLORIDE 0.9 % IV SOLN
840.0000 mg | Freq: Once | INTRAVENOUS | Status: AC
  Administered 2019-09-25: 840 mg via INTRAVENOUS
  Filled 2019-09-25: qty 14

## 2019-09-25 NOTE — Research (Signed)
Patient Carrie Alvarez presents to clinic this morning for consideration her C16D1 Huey Bienenstock only treatment on the [id number] [name] study. Patient reports feeling a little better today. States her back pain is much improved since receiving her injection from the pain clinic last week and rates the pain at a 5/10 this morning. Reports the neuropathy in fingers and bil. feet/toes remains about the same today, but the palms of her hands have improved a little and are not as tender as before.   Patient states she is experiencing a little less fatigue and wanted to use only her cane while in the clinic, but her daughter would not allow this, so she is using the wheelchair again today, but reports her ambulation has also improved since receiving the injection and having better pain control in her back. Continues to take the Eliquis twice daily, as well as Norvasc 5mg  for her B/P. Her B/P is 106/80 this morning. She is afebrile and SpO2 was checked by this RN at 100% at rest, on room air and while while wearing a mask. Reports she is now only having 1-2 bowel movements per day and her appetite remains poor. Local labs were collected via port-a-cath this morning and CBC and chemistry values were reviewed by Dr. Grayland Ormond. Patient denies any abdominal pain at present, but continues to experience occasional soreness at the incision site, and has increased intermittent abdominal pain with bowel movements. Solicited adverse events were assessed by this RN today, and she completed the PRO-CTCAE booklet after Dr. Grayland Ormond saw patient and determined she could proceed with her atezolizumab infusion today. Some weight loss noted; however patient's weight tends to fluctuate. Potassium is a little low this morning at 3. Mg/dL. Patient states she does not want to take the potassium supplements, but assures Dr. Grayland Ormond that she will start eating bananas in order to increase her potassium level and he agrees to let her try this.   Serum creatinine level is also a little elevated today at at 1.08mg /dL, and CrCl calculation is 19mL/min. Other labs are WNL for patient to receive her treatment today. H&P completed by Dr. Grayland Ormond. Patient will receive C16 Atezolizumab as scheduled today. Solicited and other Adverse events with grade and attribution are as noted below:   Adverse Event Log  Study/Protocol: [id number] [name] Cycle: Cycle 15 Event Grade Onset Date Resolved Date Drug Name Attribution Treatment Comments  Diarrhea 1 08/25/2019 05/18/2019 01/10/2019 09/08/2019 07/14/2019 05/02/2019 n/a surgery Imodium  Reports only having one stool per day now   Abdominal pain 2 04/08/2019 01/10/2019 04/12/2019 04/08/2019 n/a surgery Oxycodone Reports pain resolved  Constipation 0      Denies  Nausea 1 06/07/2019 06/14/2019    Denies further nausea  Fatigue 2 06/12/2018 approx   possible  Reports no energy in past 12 months  Anorexia 1 07/13/2008   unrelated  States this is her norm for several years  Cough 0        Dyspnea 0        Fever 1 06/07/2019 06/07/2019    Temp 99.6 in ED  UTI 0        Palmer-plantar erythrodesia 1 03/13/2019    otc cream Bilateral palms & anterior fingers remain discolored & more tender today  Total biliruben 0        Neutrophils 0        TSH 0      No history of hypothyroidism  Sensory Peripheral Neuropathy 3 06/13/2019 07/10/2019  Oxaliplatin Oxaliplatin discontinued Continuous, affecting  ability to grasp objects & ambulation, ADLs  Sensory Peripheral Neuropathy 2 07/11/2019 11/19/2014  06/13/2019  Paclitaxel Plus Oxaliplatin gabapentin Numbness & Tingling in fingertips, toes & feet bil. since taking Taxol in 2014  Hypokalemia 1 09/25/2019 08/14/2019 07/17/2019 03/27/2019 02/27/2019  08/28/2019 07/31/2019 04/17/2019 03/13/2019  probable none K+ is 3.3 mg/dL today  Anemia 1 08/28/2019 05/01/2019  06/26/2019    Hgb 11.7 today  Anemia 2 06/26/2019 02/27/2019 08/28/2019 04/17/2019  probable  none   Elevated creatinine 1 08/28/2019 02/07/2019  08/14/2019  unrelated  Elevated at baseline - now 1.08 today  Platelet count   decreased 1 05/29/2019 03/27/2019 07/17/2019 04/17/2019  definitely none Plts  211,000 today  Possible Colitis 2 04/08/2019 04/12/2019  probable Steroids Resolved  Weight Loss 1 04/10/2019 04/17/2019  probable none Weight back to normal    Proteinuria 1 05/15/2019    none Urine dipstick only  Abdominal pain 1 05/22/2019   unlikely  Occasional incisional soreness  Hypokalemia 3 05/29/2019 06/26/2019  probable IV + oral K+ K+ is 2.9 mmol/L today  vomiting 1 06/06/2019 06/07/2019  unlikely  Resolved with IVFs  somnolence 2 06/05/2019 06/08/2019  unlikely    headache 2 06/05/2019 06/07/2019  unlikely  Had Toradol in ED  Port associated  thrombosis 3 07/10/2019   unlikely Eliquis Pt reported pain in right side of neck at port site  Hypotension 2 06/12/2019 07/17/2019  possible IV fluids Norvasc on hold  Chronic back & left leg pain 2 07/13/2017   unrelated  Reports 5/10 on pain scale this morning  [name], BSN, MHA, OCN 09/25/2019 9:49 AM

## 2019-09-26 DIAGNOSIS — M545 Low back pain: Secondary | ICD-10-CM | POA: Diagnosis not present

## 2019-09-26 DIAGNOSIS — M542 Cervicalgia: Secondary | ICD-10-CM | POA: Diagnosis not present

## 2019-09-26 DIAGNOSIS — G894 Chronic pain syndrome: Secondary | ICD-10-CM | POA: Diagnosis not present

## 2019-09-27 DIAGNOSIS — I10 Essential (primary) hypertension: Secondary | ICD-10-CM | POA: Diagnosis not present

## 2019-09-27 DIAGNOSIS — R69 Illness, unspecified: Secondary | ICD-10-CM | POA: Diagnosis not present

## 2019-09-27 DIAGNOSIS — J309 Allergic rhinitis, unspecified: Secondary | ICD-10-CM | POA: Diagnosis not present

## 2019-09-27 DIAGNOSIS — E669 Obesity, unspecified: Secondary | ICD-10-CM | POA: Diagnosis not present

## 2019-09-27 DIAGNOSIS — M1990 Unspecified osteoarthritis, unspecified site: Secondary | ICD-10-CM | POA: Diagnosis not present

## 2019-09-27 DIAGNOSIS — Z6834 Body mass index (BMI) 34.0-34.9, adult: Secondary | ICD-10-CM | POA: Diagnosis not present

## 2019-09-27 DIAGNOSIS — G8929 Other chronic pain: Secondary | ICD-10-CM | POA: Diagnosis not present

## 2019-09-27 DIAGNOSIS — F419 Anxiety disorder, unspecified: Secondary | ICD-10-CM | POA: Diagnosis not present

## 2019-09-27 DIAGNOSIS — C189 Malignant neoplasm of colon, unspecified: Secondary | ICD-10-CM | POA: Diagnosis not present

## 2019-09-27 DIAGNOSIS — E785 Hyperlipidemia, unspecified: Secondary | ICD-10-CM | POA: Diagnosis not present

## 2019-09-28 NOTE — Progress Notes (Signed)
Subjective:   Carrie Alvarez is a 69 y.o. female who presents for Medicare Annual (Subsequent) preventive examination.    This visit is being conducted through telemedicine due to the COVID-19 pandemic. This patient has given me verbal consent via doximity to conduct this visit, patient states they are participating from their home address. Some vital signs may be absent or patient reported.    Patient identification: identified by name, DOB, and current address  Review of Systems:  N/A  Cardiac Risk Factors include: advanced age (>7mn, >>46women);hypertension     Objective:     Vitals: There were no vitals taken for this visit.  There is no height or weight on file to calculate BMI. Unable to obtain vitals due to visit being conducted via telephonically.   Advanced Directives 10/02/2019 09/25/2019 09/08/2019 08/28/2019 08/11/2019 07/31/2019 07/17/2019  Does Patient Have a Medical Advance Directive? No No No No No No No  Does patient want to make changes to medical advance directive? No - Patient declined Yes (ED - Information included in AVS) No - Patient declined - - - No - Patient declined  Would patient like information on creating a medical advance directive? - - No - Patient declined - - - -    Tobacco Social History   Tobacco Use  Smoking Status Former Smoker  . Packs/day: 0.50  . Types: Cigarettes  . Quit date: 07/18/2014  . Years since quitting: 5.2  Smokeless Tobacco Never Used     Counseling given: Not Answered   Clinical Intake:  Pre-visit preparation completed: Yes  Pain : 0-10 Pain Score: 8  Pain Type: Chronic pain Pain Location: Back(down left side due to a pinched nerve- sees the pain clinic for issue) Pain Orientation: Left Pain Descriptors / Indicators: Radiating, Aching Pain Frequency: Intermittent     Nutritional Risks: None Diabetes: No  How often do you need to have someone help you when you read instructions, pamphlets, or other  written materials from your doctor or pharmacy?: 3 - Sometimes(Due to cataracts)  Interpreter Needed?: No  Information [facility] :: [facility]  Past Medical History:  Diagnosis Date  . Allergy   . Anemia   . Back pain   [place]   LT LUMPECTOMY 12-2014 FOLLOWING CHEMO  . Carpal tunnel syndrome    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  . Cataract   . [place]  . Depression   . Family history of breast cancer   . Family history of colon cancer   . GERD (gastroesophageal reflux disease)    problem with reflux during chemo  . History of methicillin resistant staphylococcus aureus (MRSA)   . Hypercholesteremia   . Hypertension   . Lumbosacral pain    sees pain management in [place]  . Obesity   . Personal history of chemotherapy 2016   left breast ca  . Personal history of chemotherapy 2020   colon ca  . Pinched nerve    left side, lumbar area  [place]   Past Surgical History:  Procedure Laterality Date  . ABDOMINAL HYSTERECTOMY  1994   UTERINE CA  . AXILLARY LYMPH NODE BIOPSY Left 12/18/2014   Procedure: AXILLARY LYMPH NODE BIOPSY/;  Surgeon: JRobert Bellow MD;  Location: [facility];  Service: General;  Laterality: Left;  . AXILLARY LYMPH NODE DISSECTION Left 12/18/2014   Procedure: AXILLARY LYMPH NODE DISSECTION;  Surgeon: JRobert Bellow MD;  Location: [facility];  Service: General;  Laterality: Left;  . BREAST BIOPSY Left 05/2014   CORE BX OF LN, METASTATIC ADENOCARCINOMA  . BREAST LUMPECTOMY Left 12/2014   CHEMO FIRST THEN SURGERY OF LN  . BREAST SURGERY     lymph node removal  . CHOLECYSTECTOMY N/A 04/19/2016   Procedure: LAPAROSCOPIC CHOLECYSTECTOMY WITH INTRAOPERATIVE CHOLANGIOGRAM;  Surgeon: Hubbard Robinson, MD;  Location: [facility];  Service: General;  Laterality: N/A;  . COLON RESECTION Right 01/10/2019   Procedure: HAND ASSISTED LAPAROSCOPIC RIGHT COLON RESECTION;  Surgeon: Jules Husbands, MD;   Location: [facility];  Service: General;  Laterality: Right;  . COLONOSCOPY WITH PROPOFOL N/A 12/30/2018   Procedure: COLONOSCOPY WITH PROPOFOL;  Surgeon: Lucilla Lame, MD;  Location: [place];  Service: Endoscopy;  Laterality: N/A;  . medial branch block  11/06/2015   lumbar facet Dr. Primus Bravo  . POLYPECTOMY N/A 12/30/2018   Procedure: POLYPECTOMY;  Surgeon: Lucilla Lame, MD;  Location: [place];  Service: Endoscopy;  Laterality: N/A;  Clips placed at Hepatic Flexure Polyp (2) and Transverse Colon Polyp (4) removal sites  . PORTACATH PLACEMENT Right 02/02/2019   Procedure: INSERTION PORT-A-CATH;  Surgeon: Jules Husbands, MD;  Location: [facility];  Service: General;  Laterality: Right;  . SENTINEL NODE BIOPSY Left 12/18/2014   Procedure: SENTINEL NODE BIOPSY;  Surgeon: Robert Bellow, MD;  Location: [facility];  Service: General;  Laterality: Left;   Family History  Problem Relation Age of Onset  . Breast cancer Sister 71  . Colon cancer Sister   . Colon cancer Mother        dx 98s  . Hypertension Mother   . Asthma Mother   . Cancer Father        voice box removed  . Cancer Maternal Grandmother        unsure type  . Colon cancer Cousin   . Cancer Cousin        unsure type   Social History   Socioeconomic History  . Marital status: Married    Spouse name: Vicente Serene  . Number of children: 3  . Years of education: Not on file  . Highest education level: Some college, no degree  Occupational History  . Occupation: retired    Comment: was on disability  Tobacco Use  . Smoking status: Former Smoker    Packs/day: 0.50    Types: Cigarettes    Quit date: 07/18/2014    Years since quitting: 5.2  . Smokeless tobacco: Never Used  Substance and Sexual Activity  . Alcohol use: No    Alcohol/week: 0.0 standard drinks  . Drug use: No  . Sexual activity: Not on file  Other Topics Concern  . Not on file  Social History Narrative   Husband and patient have not lived together  for 10 years.  He is still helpful with patient.   Social Determinants of Health   Financial Resource Strain: Low Risk   . Difficulty of Paying Living Expenses: Not hard at all  Food Insecurity: No Food Insecurity  . Worried About Charity fundraiser in the Last Year: Never true  . Ran Out of Food in the Last Year: Never true  Transportation Needs: No Transportation Needs  . Lack of Transportation (Medical): No  . Lack of Transportation (Non-Medical): No  Physical Activity: Inactive  . Days of Exercise per Week: 0 days  . Minutes of Exercise per Session: 0 min  Stress: No Stress Concern  Present  . Feeling of Stress : Not at all  Social Connections: Slightly Isolated  . Frequency of Communication with Friends and Family: Twice a week  . Frequency of Social Gatherings with Friends and Family: More than three times a week  . Attends Religious Services: More than 4 times per year  . Active Member of Clubs or Organizations: No  . Attends Archivist Meetings: Never  . Marital Status: Married    Outpatient Encounter Medications as of 10/02/2019  Medication Sig  . acetaminophen (TYLENOL) 500 MG tablet Take 500 mg by mouth every 6 (six) hours as needed for mild pain or moderate pain.   Marland Kitchen albuterol (PROVENTIL HFA;VENTOLIN HFA) 108 (90 Base) MCG/ACT inhaler Inhale 2 puffs into the lungs every 6 (six) hours as needed for wheezing or shortness of breath.  Marland Kitchen amLODipine (NORVASC) 5 MG tablet Take 1 tablet (5 mg total) by mouth daily. MD re-ordered at decreased dose  . apixaban (ELIQUIS) 5 MG TABS tablet Take 1 tablet (5 mg total) by mouth 2 (two) times daily.  Marland Kitchen atorvastatin (LIPITOR) 10 MG tablet TAKE 1 TABLET BY MOUTH ONCE DAILY IN THE MORNING  . baclofen (LIORESAL) 10 MG tablet Take 1 tablet (10 mg total) by mouth 2 (two) times daily. (Patient taking differently: Take 10 mg by mouth 3 (three) times daily as needed for muscle spasms. )  . cholecalciferol (VITAMIN D) 1000 units tablet  Take 1,000 Units by mouth daily.  . fluticasone (FLONASE) 50 MCG/ACT nasal spray Use 2 spray(s) in each nostril once daily  . gabapentin (NEURONTIN) 300 MG capsule Take 300-600 mg by mouth 4 (four) times daily as needed (neuropathic pain).   Marland Kitchen letrozole (FEMARA) 2.5 MG tablet Take 2.5 mg by mouth daily.  Marland Kitchen lidocaine-prilocaine (EMLA) cream Apply to affected area once  . NARCAN 4 MG/0.1ML LIQD nasal spray kit Place 1 spray into the nose.  Marland Kitchen Oxycodone HCl 10 MG TABS LIMIT ONE HALF TO ONE TABLET BY MOUTH 3 TO 5 TIMES DAILY IF TOLERATED  . pyridoxine (B-6) 100 MG tablet Take 100 mg by mouth daily.  . sertraline (ZOLOFT) 100 MG tablet Take 2 tablets by mouth once daily  . vitamin B-12 (CYANOCOBALAMIN) 500 MCG tablet Take 500 mcg by mouth daily.  . potassium chloride SA (KLOR-CON) 20 MEQ tablet Take 1 tablet (20 mEq total) by mouth 2 (two) times daily. (Patient not taking: Reported on 09/25/2019)   Facility-Administered Encounter Medications as of 10/02/2019  Medication  . heparin lock flush 100 unit/mL  . heparin lock flush 100 unit/mL  . sodium chloride flush (NS) 0.9 % injection 10 mL    Activities of Daily Living In your present state of health, do you have any difficulty performing the following activities: 10/02/2019  Hearing? N  Vision? Y  Comment Due to cataracts on both eyes. Has an eye exam scheduled 09/11/19.  Difficulty concentrating or making decisions? N  Walking or climbing stairs? Y  Comment Due to back and leg pain.  Dressing or bathing? N  Doing errands, shopping? Y  Comment Does not drive currently due to vision issues.  Preparing Food and eating ? N  Using the Toilet? N  In the past six months, have you accidently leaked urine? N  Do you have problems with loss of bowel control? N  Managing your Medications? N  Managing your Finances? N  Housekeeping or managing your Housekeeping? N  Some recent data might be hidden    Patient Care  Team: Paulene Floor as  PCP - General (Physician Assistant) [name], MD as Consulting Physician (Oncology) [name], MD as Attending Physician (Pain Medicine) [name]., MD as Consulting Physician (Ophthalmology) [name], RN as Oncology Nurse Navigator Normajean Glasgow, MD as Attending Physician (Physical Medicine and Rehabilitation)    Assessment:   This is a routine wellness examination for Carrie Alvarez.  Exercise Activities and Dietary recommendations Current Exercise Habits: The patient does not participate in regular exercise at present, Exercise limited by: orthopedic condition(s)  Goals    . DIET - INCREASE WATER INTAKE     Recommend increasing water intake to 6-8 glasses a day.       Fall Risk: Fall Risk  10/02/2019 09/11/2019 03/01/2019 01/18/2019 01/04/2019  Falls in the past year? 0 0 0 0 0  Comment - - - - -  Number falls in past yr: 0 - - - -  Injury with Fall? 0 - - - -  Risk for fall due to : - - - - -  Follow up - - - Falls evaluation completed -    FALL RISK PREVENTION PERTAINING TO THE HOME:  Any stairs in or around the home? Yes  If so, are there any without handrails? Yes   Home free of loose throw rugs in walkways, pet beds, electrical cords, etc? Yes  Adequate lighting in your home to reduce risk of falls? Yes   ASSISTIVE DEVICES UTILIZED TO PREVENT FALLS:  Life alert? No  Use of a cane, walker or w/c? Yes  Grab bars in the bathroom? No  Shower chair or bench in shower? Yes  Elevated toilet seat or a handicapped toilet? Yes    TIMED UP AND GO:  Was the test performed? No .    Depression Screen PHQ 2/9 Scores 10/02/2019 09/30/2018 09/30/2018 09/28/2017  PHQ - 2 Score 0 0 0 1  PHQ- 9 Score - 2 - -  Exception Documentation - - - -     Cognitive Function     6CIT Screen 10/02/2019 09/22/2016  What Year? 0 points 0 points  What month? 0 points 0 points  What time? 0 points 0 points  Count back from 20 0 points 0 points  Months in reverse 0  points 0 points  Repeat phrase 0 points 0 points  Total Score 0 0    Immunization History  Administered Date(s) Administered  . Fluad Quad(high Dose 65+) 03/23/2019  . Influenza, High Dose Seasonal PF 06/07/2017, 06/07/2018  . Influenza,inj,Quad PF,6+ Mos 04/20/2016  . Pneumococcal Conjugate-13 09/22/2016  . Pneumococcal Polysaccharide-23 09/28/2017    Qualifies for Shingles Vaccine? Yes . Due for Shingrix. Pt has been advised to call insurance company to determine out of pocket expense. Advised may also receive vaccine at local pharmacy or Health Dept. Verbalized acceptance and understanding.  Tdap: Although this vaccine is not a covered service during a Wellness Exam, does the patient still wish to receive this vaccine today?  No . Advised may receive this vaccine at local pharmacy or Health Dept. Aware to provide a copy of the vaccination record if obtained from local pharmacy or Health Dept. Verbalized acceptance and understanding.  Flu Vaccine: Up to date  Pneumococcal Vaccine: Completed series  Screening Tests Health Maintenance  Topic Date Due  . TETANUS/TDAP  07/13/2026 (Originally 09/21/1969)  . MAMMOGRAM  07/03/2021  . COLONOSCOPY  12/29/2028  . INFLUENZA VACCINE  Completed  . DEXA SCAN  Completed  .  Hepatitis C Screening  Completed  . PNA vac Low Risk Adult  Completed    Cancer Screenings:  Colorectal Screening: Completed 12/30/18. Repeat every 10 years.   Mammogram: Completed 07/04/19. Repeat every 1-2 years as advised.   Bone Density: Completed 02/03/18. Previous DEXA scan was normal. No repeat needed unless advised by a physician.  Lung Cancer Screening: (Low Dose CT Chest recommended if Age 42-80 years, 30 pack-year currently smoking OR have quit w/in 15years.) does qualify however completed scan on 08/07/19. Repeat yearly.  Additional Screening:  Hepatitis C Screening: Up to date  Vision Screening: Recommended annual ophthalmology exams for early detection  of glaucoma and other disorders of the eye.  Dental Screening: Recommended annual dental exams for proper oral hygiene  Community Resource Referral:  CRR required this visit?  No       Plan:  I have personally reviewed and addressed the Medicare Annual Wellness questionnaire and have noted the following in the patient's chart:  A. Medical and social history B. Use of alcohol, tobacco or illicit drugs  C. Current medications and supplements D. Functional ability and status E.  Nutritional status F.  Physical activity G. Advance directives H. List of other physicians I.  Hospitalizations, surgeries, and ER visits in previous 12 months J.  [place] such as hearing and vision if needed, cognitive and depression L. Referrals and appointments   In addition, I have reviewed and discussed with patient certain preventive protocols, quality metrics, and best practice recommendations. A written personalized care plan for preventive services as well as general preventive health recommendations were provided to patient. Nurse Health Advisor  Signed,    Yarisa Lynam [place], [place]  [id number] Nurse Health Advisor   Nurse Notes: None.

## 2019-10-02 ENCOUNTER — Other Ambulatory Visit: Payer: Self-pay

## 2019-10-02 ENCOUNTER — Ambulatory Visit (INDEPENDENT_AMBULATORY_CARE_PROVIDER_SITE_OTHER): Payer: Medicare HMO

## 2019-10-02 DIAGNOSIS — Z0000 Encounter for general adult medical examination without abnormal findings: Secondary | ICD-10-CM

## 2019-10-02 NOTE — Patient Instructions (Signed)
Carrie Alvarez , Thank you for taking time to come for your Medicare Wellness Visit. I appreciate your ongoing commitment to your health goals. Please review the following plan we discussed and let me know if I can assist you in the future.   Screening recommendations/referrals: Colonoscopy: Up to date, due 12/2028 Mammogram: Up to date, due 06/2021 Bone Density: Up to date. Previous DEXA scan was normal. No repeat needed unless advised by a physician. Recommended yearly ophthalmology/optometry visit for glaucoma screening and checkup Recommended yearly dental visit for hygiene and checkup  Vaccinations: Influenza vaccine: Up to date Pneumococcal vaccine: Completed series Tdap vaccine: Pt declines today.  Shingles vaccine: Pt declines today.     Advanced directives: Advance directive discussed with you today. Even though you declined this today please call our office should you change your mind and we can give you the proper paperwork for you to fill out.  Conditions/risks identified: Continue to increase water intake to 6-8 8 oz glasses a day.   Next appointment: 11/20/19 @ 2:00 PM with [place] 65 Years and Older, Female Preventive care refers to lifestyle choices and visits with your health care provider that can promote health and wellness. What does preventive care include?  A yearly physical exam. This is also called an annual well check.  Dental exams once or twice a year.  Routine eye exams. Ask your health care provider how often you should have your eyes checked.  Personal lifestyle choices, including:  Daily care of your teeth and gums.  Regular physical activity.  Eating a healthy diet.  Avoiding tobacco and drug use.  Limiting alcohol use.  Practicing safe sex.  Taking low-dose aspirin every day.  Taking vitamin and mineral supplements as recommended by your health care provider. What happens during an annual well check? The  services and screenings done by your health care provider during your annual well check will depend on your age, overall health, lifestyle risk factors, and family history of disease. Counseling  Your health care provider may ask you questions about your:  Alcohol use.  Tobacco use.  Drug use.  Emotional well-being.  Home and relationship well-being.  Sexual activity.  Eating habits.  History of falls.  Memory and ability to understand (cognition).  Work and work Statistician.  Reproductive health. Screening  You may have the following tests or measurements:  Height, weight, and BMI.  Blood pressure.  Lipid and cholesterol levels. These may be checked every 5 years, or more frequently if you are over 74 years old.  Skin check.  Lung cancer screening. You may have this screening every year starting at age 82 if you have a 30-pack-year history of smoking and currently smoke or have quit within the past 15 years.  Fecal occult blood test (FOBT) of the stool. You may have this test every year starting at age 37.  Flexible sigmoidoscopy or colonoscopy. You may have a sigmoidoscopy every 5 years or a colonoscopy every 10 years starting at age 80.  Hepatitis C blood test.  Hepatitis B blood test.  Sexually transmitted disease (STD) testing.  Diabetes screening. This is done by checking your blood sugar (glucose) after you have not eaten for a while (fasting). You may have this done every 1-3 years.  Bone density scan. This is done to screen for osteoporosis. You may have this done starting at age 47.  Mammogram. This may be done every 1-2 years. Talk to your health care provider  about how often you should have regular mammograms. Talk with your health care provider about your test results, treatment options, and if necessary, the need for more tests. Vaccines  Your health care provider may recommend certain vaccines, such as:  Influenza vaccine. This is recommended  every year.  Tetanus, diphtheria, and acellular pertussis (Tdap, Td) vaccine. You may need a Td booster every 10 years.  Zoster vaccine. You may need this after age 33.  Pneumococcal 13-valent conjugate (PCV13) vaccine. One dose is recommended after age 39.  Pneumococcal polysaccharide (PPSV23) vaccine. One dose is recommended after age 33. Talk to your health care provider about which screenings and vaccines you need and how often you need them. This information is not intended to replace advice given to you by your health care provider. Make sure you discuss any questions you have with your health care provider. Document Released: 07/26/2015 Document Revised: 03/18/2016 Document Reviewed: 04/30/2015 Elsevier Interactive Patient Education  2017 [employer] Prevention in the Home Falls can cause injuries. They can happen to people of all ages. There are many things you can do to make your home safe and to help prevent falls. What can I do on the outside of my home?  Regularly fix the edges of walkways and driveways and fix any cracks.  Remove anything that might make you trip as you walk through a door, such as a raised step or threshold.  Trim any bushes or trees on the path to your home.  Use bright outdoor lighting.  Clear any walking paths of anything that might make someone trip, such as rocks or tools.  Regularly check to see if handrails are loose or broken. Make sure that both sides of any steps have handrails.  Any raised decks and porches should have guardrails on the edges.  Have any leaves, snow, or ice cleared regularly.  Use sand or salt on walking paths during winter.  Clean up any spills in your garage right away. This includes oil or grease spills. What can I do in the bathroom?  Use night lights.  Install grab bars by the toilet and in the tub and shower. Do not use towel bars as grab bars.  Use non-skid mats or decals in the tub or shower.  If  you need to sit down in the shower, use a plastic, non-slip stool.  Keep the floor dry. Clean up any water that spills on the floor as soon as it happens.  Remove soap buildup in the tub or shower regularly.  Attach bath mats securely with double-sided non-slip rug tape.  Do not have throw rugs and other things on the floor that can make you trip. What can I do in the bedroom?  Use night lights.  Make sure that you have a light by your bed that is easy to reach.  Do not use any sheets or blankets that are too big for your bed. They should not hang down onto the floor.  Have a firm chair that has side arms. You can use this for support while you get dressed.  Do not have throw rugs and other things on the floor that can make you trip. What can I do in the kitchen?  Clean up any spills right away.  Avoid walking on wet floors.  Keep items that you use a lot in easy-to-reach places.  If you need to reach something above you, use a strong step stool that has a grab bar.  Keep electrical cords out of the way.  Do not use floor polish or wax that makes floors slippery. If you must use wax, use non-skid floor wax.  Do not have throw rugs and other things on the floor that can make you trip. What can I do with my stairs?  Do not leave any items on the stairs.  Make sure that there are handrails on both sides of the stairs and use them. Fix handrails that are broken or loose. Make sure that handrails are as long as the stairways.  Check any carpeting to make sure that it is firmly attached to the stairs. Fix any carpet that is loose or worn.  Avoid having throw rugs at the top or bottom of the stairs. If you do have throw rugs, attach them to the floor with carpet tape.  Make sure that you have a light switch at the top of the stairs and the bottom of the stairs. If you do not have them, ask someone to add them for you. What else can I do to help prevent falls?  Wear shoes  that:  Do not have high heels.  Have rubber bottoms.  Are comfortable and fit you well.  Are closed at the toe. Do not wear sandals.  If you use a stepladder:  Make sure that it is fully opened. Do not climb a closed stepladder.  Make sure that both sides of the stepladder are locked into place.  Ask someone to hold it for you, if possible.  Clearly mark and make sure that you can see:  Any grab bars or handrails.  First and last steps.  Where the edge of each step is.  Use tools that help you move around (mobility aids) if they are needed. These include:  Canes.  Walkers.  Scooters.  Crutches.  Turn on the lights when you go into a dark area. Replace any light bulbs as soon as they burn out.  Set up your furniture so you have a clear path. Avoid moving your furniture around.  If any of your floors are uneven, fix them.  If there are any pets around you, be aware of where they are.  Review your medicines with your doctor. Some medicines can make you feel dizzy. This can increase your chance of falling. Ask your doctor what other things that you can do to help prevent falls. This information is not intended to replace advice given to you by your health care provider. Make sure you discuss any questions you have with your health care provider. Document Released: 04/25/2009 Document Revised: 12/05/2015 Document Reviewed: 08/03/2014 Elsevier Interactive Patient Education  2017 [employer].

## 2019-10-07 NOTE — Progress Notes (Signed)
[place]  [phone] [phone]  ID: Norm Salt OB: [birth year]  MR#: [id number]  DXA#:[id number]  Patient Care Team: Paulene Floor as PCP - General (Physician Assistant) [name], MD as Consulting Physician (Oncology) [name], MD as Attending Physician (Pain Medicine) [name]., MD as Consulting Physician (Ophthalmology) [name], RN as Oncology Nurse Navigator Normajean Glasgow, MD as Attending Physician (Physical Medicine and Rehabilitation)  CHIEF COMPLAINT: Stage IIa ER+ left breast cancer with no breast lesion and axillary lymph node metastasis.  Now with stage IIIa colon cancer.  INTERVAL HISTORY: Patient returns to clinic today for further evaluation and her next infusion of maintenance Tecentriq on clinical trial.  She has worsening cold neuropathy in her hands and feet.  She continues to have significant back pain with sciatica.  She has chronic weakness and fatigue.  She denies any recent fevers or illnesses.  She has no chest pain, shortness of breath, cough, or hemoptysis.  She denies any nausea, vomiting, or constipation.  She reports her diarrhea has resolved.  She has no melena or hematochezia.  She has no urinary complaints.  Patient offers no further specific complaints today.  REVIEW OF SYSTEMS:   Review of Systems  Constitutional: Positive for malaise/fatigue. Negative for fever and weight loss.  Respiratory: Negative.  Negative for cough and shortness of breath.   Cardiovascular: Negative.  Negative for chest pain and leg swelling.  Gastrointestinal: Negative.  Negative for abdominal pain, blood in stool, constipation, diarrhea, melena, nausea and vomiting.  Genitourinary: Negative.  Negative for dysuria.  Musculoskeletal: Positive for back pain. Negative for neck pain.  Skin: Negative.  Negative for rash.  Neurological: Positive for tingling, sensory change and weakness. Negative  for dizziness, focal weakness and headaches.  Psychiatric/Behavioral: Negative.  Negative for depression. The patient is not nervous/anxious.     As per HPI. Otherwise, a complete review of systems is negative.  PAST MEDICAL HISTORY: Past Medical History:  Diagnosis Date  . Allergy   . Anemia   . Back pain   . Breast cancer ([place]) 2015   LT LUMPECTOMY 12-2014 FOLLOWING CHEMO  . Carpal tunnel syndrome    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  . Cataract   . Colon cancer ([place]) 01/2019  . Depression   . Family history of breast cancer   . Family history of colon cancer   . GERD (gastroesophageal reflux disease)    problem with reflux during chemo  . History of methicillin resistant staphylococcus aureus (MRSA)   . Hypercholesteremia   . Hypertension   . Lumbosacral pain    sees pain management in [place]  . Obesity   . Personal history of chemotherapy 2016   left breast ca  . Personal history of chemotherapy 2020   colon ca  . Pinched nerve    left side, lumbar area  . Uterine cancer ([place]) 1994    PAST SURGICAL HISTORY: Past Surgical History:  Procedure Laterality Date  . ABDOMINAL HYSTERECTOMY  1994   UTERINE CA  . AXILLARY LYMPH NODE BIOPSY Left 12/18/2014   Procedure: AXILLARY LYMPH NODE BIOPSY/;  Surgeon: Robert Bellow, MD;  Location: [facility];  Service: General;  Laterality: Left;  . AXILLARY LYMPH NODE DISSECTION Left 12/18/2014   Procedure: AXILLARY LYMPH NODE DISSECTION;  Surgeon: Robert Bellow, MD;  Location: [facility];  Service: General;  Laterality: Left;  . BREAST BIOPSY Left 05/2014   CORE BX OF  LN, METASTATIC ADENOCARCINOMA  . BREAST LUMPECTOMY Left 12/2014   CHEMO FIRST THEN SURGERY OF LN  . BREAST SURGERY     lymph node removal  . CHOLECYSTECTOMY N/A 04/19/2016   Procedure: LAPAROSCOPIC CHOLECYSTECTOMY WITH INTRAOPERATIVE CHOLANGIOGRAM;  Surgeon: Hubbard Robinson, MD;  Location: [facility];  Service: General;  Laterality: N/A;  . COLON RESECTION  Right 01/10/2019   Procedure: HAND ASSISTED LAPAROSCOPIC RIGHT COLON RESECTION;  Surgeon: Jules Husbands, MD;  Location: [facility];  Service: General;  Laterality: Right;  . COLONOSCOPY WITH PROPOFOL N/A 12/30/2018   Procedure: COLONOSCOPY WITH PROPOFOL;  Surgeon: Lucilla Lame, MD;  Location: [facility];  Service: Endoscopy;  Laterality: N/A;  . medial branch block  11/06/2015   lumbar facet Dr. Primus Bravo  . POLYPECTOMY N/A 12/30/2018   Procedure: POLYPECTOMY;  Surgeon: Lucilla Lame, MD;  Location: [facility];  Service: Endoscopy;  Laterality: N/A;  Clips placed at Hepatic Flexure Polyp (2) and Transverse Colon Polyp (4) removal sites  . PORTACATH PLACEMENT Right 02/02/2019   Procedure: INSERTION PORT-A-CATH;  Surgeon: Jules Husbands, MD;  Location: [facility];  Service: General;  Laterality: Right;  . SENTINEL NODE BIOPSY Left 12/18/2014   Procedure: SENTINEL NODE BIOPSY;  Surgeon: Robert Bellow, MD;  Location: [facility];  Service: General;  Laterality: Left;    FAMILY HISTORY Family History  Problem Relation Age of Onset  . Breast cancer Sister 87  . Colon cancer Sister   . Colon cancer Mother        dx 106s  . Hypertension Mother   . Asthma Mother   . Cancer Father        voice box removed  . Cancer Maternal Grandmother        unsure type  . Colon cancer Cousin   . Cancer Cousin        unsure type       ADVANCED DIRECTIVES:    HEALTH MAINTENANCE: Social History   Tobacco Use  . Smoking status: Former Smoker    Packs/day: 0.50    Types: Cigarettes    Quit date: 07/18/2014    Years since quitting: 5.2  . Smokeless tobacco: Never Used  Substance Use Topics  . Alcohol use: No    Alcohol/week: 0.0 standard drinks  . Drug use: No    No Known Allergies  Current Outpatient Medications  Medication Sig Dispense Refill  . acetaminophen (TYLENOL) 500 MG tablet Take 500 mg by mouth every 6 (six) hours as needed for mild pain or moderate pain.     Marland Kitchen albuterol  (PROVENTIL HFA;VENTOLIN HFA) 108 (90 Base) MCG/ACT inhaler Inhale 2 puffs into the lungs every 6 (six) hours as needed for wheezing or shortness of breath. 1 Inhaler 2  . amLODipine (NORVASC) 5 MG tablet Take 1 tablet (5 mg total) by mouth daily. MD re-ordered at decreased dose 30 tablet 5  . apixaban (ELIQUIS) 5 MG TABS tablet Take 1 tablet (5 mg total) by mouth 2 (two) times daily. 60 tablet 3  . atorvastatin (LIPITOR) 10 MG tablet TAKE 1 TABLET BY MOUTH IN THE MORNING 90 tablet 0  . baclofen (LIORESAL) 10 MG tablet Take 1 tablet (10 mg total) by mouth 2 (two) times daily. 30 each 0  . cholecalciferol (VITAMIN D) 1000 units tablet Take 1,000 Units by mouth daily.    . fluticasone (FLONASE) 50 MCG/ACT nasal spray Use 2 spray(s) in each nostril once daily 16 g 0  . gabapentin (NEURONTIN) 300  MG capsule Take 300-600 mg by mouth 4 (four) times daily as needed (neuropathic pain).     Marland Kitchen letrozole (FEMARA) 2.5 MG tablet Take 2.5 mg by mouth daily.    Marland Kitchen lidocaine-prilocaine (EMLA) cream Apply to affected area once 30 g 3  . NARCAN 4 MG/0.1ML LIQD nasal spray kit Place 1 spray into the nose.    Marland Kitchen Oxycodone HCl 10 MG TABS LIMIT ONE HALF TO ONE TABLET BY MOUTH 3 TO 5 TIMES DAILY IF TOLERATED    . pyridoxine (B-6) 100 MG tablet Take 100 mg by mouth daily.    . sertraline (ZOLOFT) 100 MG tablet Take 2 tablets by mouth once daily 180 tablet 0  . vitamin B-12 (CYANOCOBALAMIN) 500 MCG tablet Take 500 mcg by mouth daily.    . potassium chloride SA (KLOR-CON) 20 MEQ tablet Take 1 tablet (20 mEq total) by mouth 2 (two) times daily. (Patient not taking: Reported on 09/25/2019) 60 tablet 1   No current facility-administered medications for this visit.   Facility-Administered Medications Ordered in Other Visits  Medication Dose Route Frequency Provider Last Rate Last Admin  . 0.9 %  sodium chloride infusion   Intravenous Once [name], MD      . heparin lock flush 100 unit/mL  500 Units Intravenous Once  [name], MD      . heparin lock flush 100 unit/mL  500 Units Intravenous Once [name], MD      . heparin lock flush 100 unit/mL  500 Units Intravenous Once [name], MD      . INV-atezolizumab [facility] [phone] (TECENTRIQ) 840 mg in sodium chloride 0.9 % 250 mL chemo infusion  840 mg Intravenous Once [name], MD      . sodium chloride flush (NS) 0.9 % injection 10 mL  10 mL Intravenous PRN [name], MD   10 mL at 02/27/19 0925    OBJECTIVE: Vitals:   10/09/19 0917  BP: (!) 118/98  Pulse: 81  Resp: 18  Temp: (!) 95.3 F (35.2 C)  SpO2: 100%     Body mass index is 34.83 kg/m.    ECOG FS:1 - Symptomatic but completely ambulatory  General: Well-developed, well-nourished, no acute distress.  Sitting in a wheelchair. Eyes: Pink conjunctiva, anicteric sclera. HEENT: Normocephalic, moist mucous membranes. Lungs: No audible wheezing or coughing. Heart: Regular rate and rhythm. Abdomen: Soft, nontender, no obvious distention. Musculoskeletal: No edema, cyanosis, or clubbing. Neuro: Alert, answering all questions appropriately. Cranial nerves grossly intact. Skin: No rashes or petechiae noted. Psych: Normal affect.   LAB RESULTS:  Lab Results  Component Value Date   NA 139 10/09/2019   K 3.6 10/09/2019   CL 105 10/09/2019   CO2 26 10/09/2019   GLUCOSE 100 (H) 10/09/2019   BUN 9 10/09/2019   CREATININE 0.97 10/09/2019   CALCIUM 9.6 10/09/2019   PROT 7.2 10/09/2019   ALBUMIN 3.8 10/09/2019   AST 18 10/09/2019   ALT 15 10/09/2019   ALKPHOS 79 10/09/2019   BILITOT 0.7 10/09/2019   GFRNONAA 60 (L) 10/09/2019   GFRAA >60 10/09/2019    Lab Results  Component Value Date   WBC 6.4 10/09/2019   NEUTROABS 4.5 10/09/2019   HGB 11.2 (L) 10/09/2019   HCT 32.5 (L) 10/09/2019   MCV 83.1 10/09/2019   PLT 217 10/09/2019     STUDIES: No results found.  ASSESSMENT: Stage IIa ER+ left breast cancer with no breast lesion and  axillary  lymph node metastasis. (TxN1M0)  HER-2 negative.  Now with stage IIIa colon cancer.  PLAN:   1.  Stage IIIa colon cancer: Pathology report reviewed independently.  Patient's most recent imaging on August 07, 2019 with CT scans revealed no evidence of recurrent or progressive disease.  Previously, patient agreed to enroll in clinical trial.  Oxaliplatin was discontinued early secondary to worsening neuropathy.  Patient completed 12 cycles of chemotherapy on July 31, 2019.  Continue with maintenance Tecentriq for an additional 6 months.  Proceed with Tecentriq today.  Return to clinic in 2 weeks for further evaluation and continuation of treatment. 2. Stage IIa ER+ left breast cancer with no breast lesion and axillary lymph node metastasis: Despite no obvious breast lesion on mammogram or breast MRI, pathology and pattern of spread was consistent with primary breast cancer. CT and bone scan at time of diagnosis revealed no other evidence of malignancy.  She only received 3 cycles of Adriamycin and Cytoxan. Taxol was discontinued early as well secondary to persistent peripheral neuropathy. Patient completed her chemotherapy on Nov 19, 2014.  She elected not to pursue axillary node dissection given the potential morbidity of this procedure. It was also elected not to pursue adjuvant XRT given there was no primary breast lesion.  Patient completed approximately 4 years of treatment with letrozole, but this was discontinued secondary to enrolling in clinical trial for her newly diagnosed colon cancer.  Her most recent mammogram on July 04, 2019 was reported as BI-RADS 1.  Repeat in December 2021. 3.  Bone health: Patient's most recent bone mineral density on February 03, 2018 reported T score of -0.7 which is unchanged from 3 years prior.  Consider repeat bone mineral density in July 2021.  4.  Lynch syndrome: Genetic testing confirmed patient has Lynch syndrome.  She reports her sister and son both  have tested positive.  She plans to talk to the rest of her children about genetic testing.  Appreciate genetics input. 5.  Endometrial cancer: Patient has had a total hysterectomy. 6.  Back pain: Chronic and unchanged.  Patient has an appointment later this week for consideration of injection. 7.  Peripheral neuropathy: Grade 3.  Worse this week.  Patient has been instructed to increase her gabapentin to 900 mg 3 times a day.  If she sees no improvements, can consider referral to neurology. 8.  Anemia: Patient's hemoglobin is trended down slightly to 11.2, monitor. 9.  Renal insufficiency: Resolved. 10.  Hypokalemia: Resolved.  Patient no longer takes supplementation and was previously given dietary modifications. 11.  Diarrhea: Resolved.  Continue OTC Imodium as needed.  12.  Fatigue: Chronic and unchanged.  Patient has no new medications, she is anemic but this is relatively unchanged.  Thyroid studies are within normal limits.  She states her sleep habits are good.  Continue to monitor and consider a trial of dexamethasone or Remeron. 13.  Hypotension: Improved.  Continue Norvasc 5 mg daily. 14.  Port associated clot: Continue Eliquis as prescribed for a total of 3 months completing treatment in March 2021.   Patient expressed understanding and was in agreement with this plan. She also understands that She can call clinic at any time with any questions, concerns, or complaints.   Breast cancer metastasized to axillary lymph node   Staging form: Breast, AJCC 7th Edition     Clinical stage from 10/22/2014: Stage Unknown (TX, N1, M0) - Signed by [name], MD on 10/22/2014   [name],  MD   10/09/2019 10:20 AM

## 2019-10-09 ENCOUNTER — Encounter: Payer: Self-pay | Admitting: Oncology

## 2019-10-09 ENCOUNTER — Inpatient Hospital Stay: Payer: Medicare HMO

## 2019-10-09 ENCOUNTER — Other Ambulatory Visit: Payer: Self-pay | Admitting: Physician Assistant

## 2019-10-09 ENCOUNTER — Encounter: Payer: Self-pay | Admitting: *Deleted

## 2019-10-09 ENCOUNTER — Inpatient Hospital Stay (HOSPITAL_BASED_OUTPATIENT_CLINIC_OR_DEPARTMENT_OTHER): Payer: Medicare HMO | Admitting: Oncology

## 2019-10-09 VITALS — BP 118/98 | HR 81 | Temp 95.3°F | Resp 18 | Wt 196.6 lb

## 2019-10-09 VITALS — BP 117/83 | HR 74 | Temp 97.0°F | Resp 16

## 2019-10-09 DIAGNOSIS — E7800 Pure hypercholesterolemia, unspecified: Secondary | ICD-10-CM

## 2019-10-09 DIAGNOSIS — F329 Major depressive disorder, single episode, unspecified: Secondary | ICD-10-CM

## 2019-10-09 DIAGNOSIS — Z86718 Personal history of other venous thrombosis and embolism: Secondary | ICD-10-CM

## 2019-10-09 DIAGNOSIS — G620 Drug-induced polyneuropathy: Secondary | ICD-10-CM | POA: Diagnosis not present

## 2019-10-09 DIAGNOSIS — Z808 Family history of malignant neoplasm of other organs or systems: Secondary | ICD-10-CM

## 2019-10-09 DIAGNOSIS — R531 Weakness: Secondary | ICD-10-CM | POA: Diagnosis not present

## 2019-10-09 DIAGNOSIS — Z8542 Personal history of malignant neoplasm of other parts of uterus: Secondary | ICD-10-CM

## 2019-10-09 DIAGNOSIS — M5430 Sciatica, unspecified side: Secondary | ICD-10-CM

## 2019-10-09 DIAGNOSIS — Z809 Family history of malignant neoplasm, unspecified: Secondary | ICD-10-CM

## 2019-10-09 DIAGNOSIS — C189 Malignant neoplasm of colon, unspecified: Secondary | ICD-10-CM | POA: Diagnosis not present

## 2019-10-09 DIAGNOSIS — R5383 Other fatigue: Secondary | ICD-10-CM

## 2019-10-09 DIAGNOSIS — Z9221 Personal history of antineoplastic chemotherapy: Secondary | ICD-10-CM

## 2019-10-09 DIAGNOSIS — F32A Depression, unspecified: Secondary | ICD-10-CM

## 2019-10-09 DIAGNOSIS — Z800 Family history of malignant neoplasm of digestive organs: Secondary | ICD-10-CM

## 2019-10-09 DIAGNOSIS — C184 Malignant neoplasm of transverse colon: Secondary | ICD-10-CM

## 2019-10-09 DIAGNOSIS — Z006 Encounter for examination for normal comparison and control in clinical research program: Secondary | ICD-10-CM

## 2019-10-09 DIAGNOSIS — Z87891 Personal history of nicotine dependence: Secondary | ICD-10-CM

## 2019-10-09 DIAGNOSIS — Z95828 Presence of other vascular implants and grafts: Secondary | ICD-10-CM

## 2019-10-09 DIAGNOSIS — D649 Anemia, unspecified: Secondary | ICD-10-CM | POA: Diagnosis not present

## 2019-10-09 DIAGNOSIS — Z1509 Genetic susceptibility to other malignant neoplasm: Secondary | ICD-10-CM

## 2019-10-09 DIAGNOSIS — Z90710 Acquired absence of both cervix and uterus: Secondary | ICD-10-CM

## 2019-10-09 DIAGNOSIS — I959 Hypotension, unspecified: Secondary | ICD-10-CM

## 2019-10-09 DIAGNOSIS — G8929 Other chronic pain: Secondary | ICD-10-CM | POA: Diagnosis not present

## 2019-10-09 DIAGNOSIS — M549 Dorsalgia, unspecified: Secondary | ICD-10-CM | POA: Diagnosis not present

## 2019-10-09 DIAGNOSIS — Z7901 Long term (current) use of anticoagulants: Secondary | ICD-10-CM

## 2019-10-09 DIAGNOSIS — Z803 Family history of malignant neoplasm of breast: Secondary | ICD-10-CM

## 2019-10-09 LAB — COMPREHENSIVE METABOLIC PANEL
ALT: 15 U/L (ref 0–44)
AST: 18 U/L (ref 15–41)
Albumin: 3.8 g/dL (ref 3.5–5.0)
Alkaline Phosphatase: 79 U/L (ref 38–126)
Anion gap: 8 (ref 5–15)
BUN: 9 mg/dL (ref 8–23)
CO2: 26 mmol/L (ref 22–32)
Calcium: 9.6 mg/dL (ref 8.9–10.3)
Chloride: 105 mmol/L (ref 98–111)
Creatinine, Ser: 0.97 mg/dL (ref 0.44–1.00)
GFR calc Af Amer: >60 mL/min (ref >60)
GFR calc non Af Amer: 60 mL/min — ABNORMAL LOW (ref >60)
Glucose, Bld: 100 mg/dL — ABNORMAL HIGH (ref 70–99)
Potassium: 3.6 mmol/L (ref 3.5–5.1)
Sodium: 139 mmol/L (ref 135–145)
Total Bilirubin: 0.7 mg/dL (ref 0.3–1.2)
Total Protein: 7.2 g/dL (ref 6.5–8.1)

## 2019-10-09 LAB — CBC WITH DIFFERENTIAL/PLATELET
Abs Immature Granulocytes: 0.03 10*3/uL (ref 0.00–0.07)
Basophils Absolute: 0 10*3/uL (ref 0.0–0.1)
Basophils Relative: 1 %
Eosinophils Absolute: 0.1 10*3/uL (ref 0.0–0.5)
Eosinophils Relative: 2 %
HCT: 32.5 % — ABNORMAL LOW (ref 36.0–46.0)
Hemoglobin: 11.2 g/dL — ABNORMAL LOW (ref 12.0–15.0)
Immature Granulocytes: 1 %
Lymphocytes Relative: 20 %
Lymphs Abs: 1.3 10*3/uL (ref 0.7–4.0)
MCH: 28.6 pg (ref 26.0–34.0)
MCHC: 34.5 g/dL (ref 30.0–36.0)
MCV: 83.1 fL (ref 80.0–100.0)
Monocytes Absolute: 0.5 10*3/uL (ref 0.1–1.0)
Monocytes Relative: 7 %
Neutro Abs: 4.5 10*3/uL (ref 1.7–7.7)
Neutrophils Relative %: 69 %
Platelets: 217 10*3/uL (ref 150–400)
RBC: 3.91 MIL/uL (ref 3.87–5.11)
RDW: 16.1 % — ABNORMAL HIGH (ref 11.5–15.5)
WBC: 6.4 10*3/uL (ref 4.0–10.5)
nRBC: 0 % (ref 0.0–0.2)

## 2019-10-09 MED ORDER — SODIUM CHLORIDE 0.9 % IV SOLN
Freq: Once | INTRAVENOUS | Status: AC
  Filled 2019-10-09: qty 250

## 2019-10-09 MED ORDER — SODIUM CHLORIDE 0.9% FLUSH
10.0000 mL | Freq: Once | INTRAVENOUS | Status: AC
  Administered 2019-10-09: 10 mL via INTRAVENOUS
  Filled 2019-10-09: qty 10

## 2019-10-09 MED ORDER — SODIUM CHLORIDE 0.9 % IV SOLN
840.0000 mg | Freq: Once | INTRAVENOUS | Status: AC
  Administered 2019-10-09: 840 mg via INTRAVENOUS
  Filled 2019-10-09: qty 14

## 2019-10-09 MED ORDER — HEPARIN SOD (PORK) LOCK FLUSH 100 UNIT/ML IV SOLN
500.0000 [IU] | Freq: Once | INTRAVENOUS | Status: AC
  Administered 2019-10-09: 500 [IU] via INTRAVENOUS
  Filled 2019-10-09: qty 5

## 2019-10-09 MED ORDER — HEPARIN SOD (PORK) LOCK FLUSH 100 UNIT/ML IV SOLN
INTRAVENOUS | Status: AC
  Filled 2019-10-09: qty 5

## 2019-10-09 NOTE — Research (Addendum)
Patient Olympia Thissen presents to clinic this morning for consideration her C17D1 Huey Bienenstock only treatment on the [id number] [name] study. Patient reports she is not feeling as well today. States her back pain is returning and she will be seen at the pain clinic for follow up tomorrow. Reports her hands are cold and tingling, and more tender feeling today, and states her feet are also worse this morning. States she feels a lump in the bottom of both feet and some worsening numbness as well. Dr. Grayland Ormond has increased her gabapentin dose to 300mg  three times daily beginning today and states if this does not help with the neuropathy, he will send her for a neurology consult. Patient states she is experiencing a little more fatigue now and Dr. Grayland Ormond warned her that the increase in gabapentin may even cause increased fatigue for her. She is using the wheelchair while in clinic as usual, but reports she is still able to ambulate independently using her cane at home. She continues to take the Eliquis twice daily, as well as Norvasc 5mg  for her B/P. Her B/P is 118/98 this morning. She is afebrile and SpO2 was measured at 100% at rest, on room air and while while wearing a mask. Reports she is now only having one bowel movements per day and her appetite remains poor. Local labs were collected via port-a-cath this morning and CBC and chemistry values were reviewed by Dr. Grayland Ormond. TSH was also collected but has not resulted yet. Patient denies any abdominal pain at present and states this has now resolved. Solicited adverse events were assessed by this RN today, and she completed the PRO-CTCAE booklet in clinic immediately after Dr. Grayland Ormond saw patient and determined she could proceed with her atezolizumab infusion today. Weight remains stable even though patient reports no appetite. Potassium is wnl this morning at 3.6 Mg/dL. Serum creatinine level is wnl at 0.97mg /dL, and CrCl calculation is 28mL/min. Other labs  are WNL for patient to receive her treatment today. H&P completed by Dr. Grayland Ormond. Patient will receive C17 Atezolizumab as scheduled today. Solicited and other Adverse events with grade and attribution are as noted below:   Adverse Event Log  Study/Protocol: [id number] [name] Cycle: Cycle 16 Event Grade Onset Date Resolved Date Drug Name Attribution Treatment Comments  Diarrhea 1 08/25/2019 05/18/2019 01/10/2019 09/08/2019 07/14/2019 05/02/2019 n/a surgery Imodium  Reports only having one stool per day now   Abdominal pain 2 04/08/2019 01/10/2019 04/12/2019 04/08/2019 n/a surgery Oxycodone Reports pain resolved  Constipation 0      Denies  Nausea 1 10/09/2019 06/07/2019  06/14/2019  unrelated  Began while in exam room  Fatigue 2 06/12/2018 approx   possible  Reports no energy in past 12 months  Anorexia 1 07/13/2008   unrelated  States this is her norm for several years  Cough 0        Dyspnea 0        Fever 1 06/07/2019 06/07/2019    Temp 99.6 in ED  UTI 0        Palmer-plantar erythrodesia 1 03/13/2019    otc cream Bilateral palms & anterior fingers remain discolored & more tender today  Total biliruben 0        Neutrophils 0        TSH 0      No history of hypothyroidism  Sensory Peripheral Neuropathy 3 06/13/2019 07/10/2019  Oxaliplatin Oxaliplatin discontinued Continuous, affecting ability to grasp objects & ambulation, ADLs  Sensory Peripheral Neuropathy 2 07/11/2019 11/19/2014  06/13/2019  Paclitaxel Plus Oxaliplatin Gabapentin - dose increased today Numbness & Tingling in fingertips, toes & feet bil. since taking Taxol in 2014  Hypokalemia 1 09/25/2019 08/14/2019 07/17/2019 03/27/2019 02/27/2019 10/09/2019 08/28/2019 07/31/2019 04/17/2019 03/13/2019  probable none K+ is 3.6 mg/dL today  Anemia 1 08/28/2019 05/01/2019  06/26/2019    Hgb 11.7 today  Anemia 2 06/26/2019 02/27/2019 08/28/2019 04/17/2019  probable none   Elevated creatinine 1 08/28/2019 02/07/2019  10/09/2019 08/14/2019  unrelated  0.97mg /dL today  Platelet count   decreased 1 05/29/2019 03/27/2019 07/17/2019 04/17/2019  definitely none Plts  217,000 today  Possible Colitis 2 04/08/2019 04/12/2019  probable Steroids Resolved  Weight Loss 1 04/10/2019 04/17/2019  probable none Weight back to normal    Proteinuria 1 05/15/2019    none Urine dipstick only  Abdominal pain 1 05/22/2019 10/09/2019  unlikely  Reports this has resolved now  Hypokalemia 3 05/29/2019 06/26/2019  probable IV + oral K+ K+ is 2.9 mmol/L today  vomiting 1 06/06/2019 06/07/2019  unlikely  Resolved with IVFs  somnolence 2 06/05/2019 06/08/2019  unlikely    headache 2 06/05/2019 06/07/2019  unlikely  Had Toradol in ED  Port associated  thrombosis 3 07/10/2019   unlikely Eliquis Pt reported pain in right side of neck at port site  Hypotension 2 06/12/2019 07/17/2019  possible IV fluids Norvasc on hold  Chronic back & left leg pain 2 07/13/2017   unrelated  Reports 5/10 on pain scale this morning  [name], BSN, MHA, OCN 10/09/2019 9:34 AM  Reports nausea has improved a lot since she was able to eat some crackers, but it is not completely resolved. Reminded patient to increase her gabapentin as previously directed by Dr. Grayland Ormond. [name], BSN, MHA, OCN 10/09/2019 11:37 AM

## 2019-10-10 DIAGNOSIS — M5416 Radiculopathy, lumbar region: Secondary | ICD-10-CM | POA: Diagnosis not present

## 2019-10-10 LAB — THYROID PANEL WITH TSH
Free Thyroxine Index: 1.2 (ref 1.2–4.9)
T3 Uptake Ratio: 26 % (ref 24–39)
T4, Total: 4.8 ug/dL (ref 4.5–12.0)
TSH: 1.5 u[IU]/mL (ref 0.450–4.500)

## 2019-10-11 DIAGNOSIS — H2513 Age-related nuclear cataract, bilateral: Secondary | ICD-10-CM | POA: Diagnosis not present

## 2019-10-18 DIAGNOSIS — M5136 Other intervertebral disc degeneration, lumbar region: Secondary | ICD-10-CM | POA: Diagnosis not present

## 2019-10-18 DIAGNOSIS — M545 Low back pain: Secondary | ICD-10-CM | POA: Diagnosis not present

## 2019-10-18 DIAGNOSIS — G894 Chronic pain syndrome: Secondary | ICD-10-CM | POA: Diagnosis not present

## 2019-10-18 DIAGNOSIS — M19011 Primary osteoarthritis, right shoulder: Secondary | ICD-10-CM | POA: Diagnosis not present

## 2019-10-20 ENCOUNTER — Other Ambulatory Visit: Payer: Self-pay | Admitting: Emergency Medicine

## 2019-10-20 ENCOUNTER — Encounter: Payer: Self-pay | Admitting: Oncology

## 2019-10-20 DIAGNOSIS — C184 Malignant neoplasm of transverse colon: Secondary | ICD-10-CM

## 2019-10-20 NOTE — Progress Notes (Signed)
Patient denies having any pain or concerns at this time.

## 2019-10-20 NOTE — Progress Notes (Signed)
Carrie Alvarez  [phone] [phone]  ID: Carrie Alvarez OB: January 09, 1951  MR#: [id number]  LTR#:[id number]  Patient Care Team: Paulene Floor as PCP - General (Physician Assistant) [name], MD as Consulting Physician (Oncology) [name], MD as Attending Physician (Pain Medicine) [name]., MD as Consulting Physician (Ophthalmology) [name], RN as Oncology Nurse Navigator Normajean Glasgow, MD as Attending Physician (Physical Medicine and Rehabilitation)  CHIEF COMPLAINT: Stage IIa ER+ left breast cancer with no breast lesion and axillary lymph node metastasis.  Now with stage IIIa colon cancer.  INTERVAL HISTORY: Patient returns to clinic today for further evaluation and continuation of maintenance Tecentriq.  She continues to have significant peripheral neuropathy as well as a cold neuropathy.  Her back pain and sciatica is also chronic and unchanged.  She continues to have chronic weakness and fatigue. She denies any recent fevers or illnesses.  She has no chest pain, shortness of breath, cough, or hemoptysis.  She denies any nausea, vomiting, constipation, or diarrhea.  She has no melena or hematochezia.  She has no urinary complaints.  Patient offers no further specific complaints today.  REVIEW OF SYSTEMS:   Review of Systems  Constitutional: Positive for malaise/fatigue. Negative for fever and weight loss.  Respiratory: Negative.  Negative for cough and shortness of breath.   Cardiovascular: Negative.  Negative for chest pain and leg swelling.  Gastrointestinal: Negative.  Negative for abdominal pain, blood in stool, constipation, diarrhea, melena, nausea and vomiting.  Genitourinary: Negative.  Negative for dysuria.  Musculoskeletal: Positive for back pain. Negative for neck pain.  Skin: Negative.  Negative for rash.  Neurological: Positive for tingling, sensory change and weakness. Negative for  dizziness, focal weakness and headaches.  Psychiatric/Behavioral: Negative.  Negative for depression. The patient is not nervous/anxious.     As per HPI. Otherwise, a complete review of systems is negative.  PAST MEDICAL HISTORY: Past Medical History:  Diagnosis Date  . Allergy   . Anemia   . Back pain   . Breast cancer ([place]) 2015   LT LUMPECTOMY 12-2014 FOLLOWING CHEMO  . Carpal tunnel syndrome    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  . Cataract   . Colon cancer ([place]) 01/2019  . Depression   . Family history of breast cancer   . Family history of colon cancer   . GERD (gastroesophageal reflux disease)    problem with reflux during chemo  . History of methicillin resistant staphylococcus aureus (MRSA)   . Hypercholesteremia   . Hypertension   . Lumbosacral pain    sees pain management in [place]  . Obesity   . Personal history of chemotherapy 2016   left breast ca  . Personal history of chemotherapy 2020   colon ca  . Pinched nerve    left side, lumbar area  . Uterine cancer ([place]) 1994    PAST SURGICAL HISTORY: Past Surgical History:  Procedure Laterality Date  . ABDOMINAL HYSTERECTOMY  1994   UTERINE CA  . AXILLARY LYMPH NODE BIOPSY Left 12/18/2014   Procedure: AXILLARY LYMPH NODE BIOPSY/;  Surgeon: Robert Bellow, MD;  Location: [facility];  Service: General;  Laterality: Left;  . AXILLARY LYMPH NODE DISSECTION Left 12/18/2014   Procedure: AXILLARY LYMPH NODE DISSECTION;  Surgeon: Robert Bellow, MD;  Location: [facility];  Service: General;  Laterality: Left;  . BREAST BIOPSY Left 05/2014   CORE BX OF LN, METASTATIC ADENOCARCINOMA  . BREAST  LUMPECTOMY Left 12/2014   CHEMO FIRST THEN SURGERY OF LN  . BREAST SURGERY     lymph node removal  . CHOLECYSTECTOMY N/A 04/19/2016   Procedure: LAPAROSCOPIC CHOLECYSTECTOMY WITH INTRAOPERATIVE CHOLANGIOGRAM;  Surgeon: Hubbard Robinson, MD;  Location: [facility];  Service: General;  Laterality: N/A;  . COLON RESECTION  Right 01/10/2019   Procedure: HAND ASSISTED LAPAROSCOPIC RIGHT COLON RESECTION;  Surgeon: Jules Husbands, MD;  Location: [facility];  Service: General;  Laterality: Right;  . COLONOSCOPY WITH PROPOFOL N/A 12/30/2018   Procedure: COLONOSCOPY WITH PROPOFOL;  Surgeon: Lucilla Lame, MD;  Location: [facility];  Service: Endoscopy;  Laterality: N/A;  . medial branch block  11/06/2015   lumbar facet Dr. Primus Bravo  . POLYPECTOMY N/A 12/30/2018   Procedure: POLYPECTOMY;  Surgeon: Lucilla Lame, MD;  Location: [facility];  Service: Endoscopy;  Laterality: N/A;  Clips placed at Hepatic Flexure Polyp (2) and Transverse Colon Polyp (4) removal sites  . PORTACATH PLACEMENT Right 02/02/2019   Procedure: INSERTION PORT-A-CATH;  Surgeon: Jules Husbands, MD;  Location: [facility];  Service: General;  Laterality: Right;  . SENTINEL NODE BIOPSY Left 12/18/2014   Procedure: SENTINEL NODE BIOPSY;  Surgeon: Robert Bellow, MD;  Location: [facility];  Service: General;  Laterality: Left;    FAMILY HISTORY Family History  Problem Relation Age of Onset  . Breast cancer Sister 77  . Colon cancer Sister   . Colon cancer Mother        dx 69s  . Hypertension Mother   . Asthma Mother   . Cancer Father        voice box removed  . Cancer Maternal Grandmother        unsure type  . Colon cancer Cousin   . Cancer Cousin        unsure type       ADVANCED DIRECTIVES:    HEALTH MAINTENANCE: Social History   Tobacco Use  . Smoking status: Former Smoker    Packs/day: 0.50    Types: Cigarettes    Quit date: 07/18/2014    Years since quitting: 5.2  . Smokeless tobacco: Never Used  Substance Use Topics  . Alcohol use: No    Alcohol/week: 0.0 standard drinks  . Drug use: No    No Known Allergies  Current Outpatient Medications  Medication Sig Dispense Refill  . acetaminophen (TYLENOL) 500 MG tablet Take 500 mg by mouth every 6 (six) hours as needed for mild pain or moderate pain.     Marland Kitchen albuterol  (PROVENTIL HFA;VENTOLIN HFA) 108 (90 Base) MCG/ACT inhaler Inhale 2 puffs into the lungs every 6 (six) hours as needed for wheezing or shortness of breath. 1 Inhaler 2  . amLODipine (NORVASC) 5 MG tablet Take 1 tablet (5 mg total) by mouth daily. MD re-ordered at decreased dose 30 tablet 5  . apixaban (ELIQUIS) 5 MG TABS tablet Take 1 tablet (5 mg total) by mouth 2 (two) times daily. 60 tablet 3  . atorvastatin (LIPITOR) 10 MG tablet TAKE 1 TABLET BY MOUTH IN THE MORNING 90 tablet 0  . baclofen (LIORESAL) 10 MG tablet Take 1 tablet (10 mg total) by mouth 2 (two) times daily. 30 each 0  . cholecalciferol (VITAMIN D) 1000 units tablet Take 1,000 Units by mouth daily.    . fluticasone (FLONASE) 50 MCG/ACT nasal spray Use 2 spray(s) in each nostril once daily 16 g 0  . gabapentin (NEURONTIN) 300 MG capsule Take 300-600 mg by  mouth 4 (four) times daily as needed (neuropathic pain).     Marland Kitchen letrozole (FEMARA) 2.5 MG tablet Take 2.5 mg by mouth daily.    Marland Kitchen lidocaine-prilocaine (EMLA) cream Apply to affected area once 30 g 3  . NARCAN 4 MG/0.1ML LIQD nasal spray kit Place 1 spray into the nose.    Marland Kitchen Oxycodone HCl 10 MG TABS LIMIT ONE HALF TO ONE TABLET BY MOUTH 3 TO 5 TIMES DAILY IF TOLERATED    . pyridoxine (B-6) 100 MG tablet Take 100 mg by mouth daily.    . sertraline (ZOLOFT) 100 MG tablet Take 2 tablets by mouth once daily 180 tablet 0  . vitamin B-12 (CYANOCOBALAMIN) 500 MCG tablet Take 500 mcg by mouth daily.    . potassium chloride SA (KLOR-CON) 20 MEQ tablet Take 1 tablet (20 mEq total) by mouth 2 (two) times daily. (Patient not taking: Reported on 09/25/2019) 60 tablet 1   No current facility-administered medications for this visit.   Facility-Administered Medications Ordered in Other Visits  Medication Dose Route Frequency Provider Last Rate Last Admin  . heparin lock flush 100 unit/mL  500 Units Intravenous Once [name], MD      . heparin lock flush 100 unit/mL  500 Units  Intravenous Once [name], MD      . heparin lock flush 100 unit/mL  500 Units Intravenous Once [name], MD      . heparin lock flush 100 unit/mL  500 Units Intracatheter Once PRN [name], MD      . INV-atezolizumab [facility]ANCE [phone] (TECENTRIQ) 840 mg in sodium chloride 0.9 % 250 mL chemo infusion  840 mg Intravenous Once [name], MD      . sodium chloride flush (NS) 0.9 % injection 10 mL  10 mL Intravenous PRN [name], MD   10 mL at 02/27/19 0925    OBJECTIVE: Vitals:   10/23/19 0951  BP: (!) 112/91  Pulse: 75  Resp: 18  Temp: (!) 96.5 F (35.8 C)  SpO2: 99%     Body mass index is 33.57 kg/m.    ECOG FS:1 - Symptomatic but completely ambulatory  General: Well-developed, well-nourished, no acute distress.  Sitting in a wheelchair. Eyes: Pink conjunctiva, anicteric sclera. HEENT: Normocephalic, moist mucous membranes. Lungs: No audible wheezing or coughing. Heart: Regular rate and rhythm. Abdomen: Soft, nontender, no obvious distention. Musculoskeletal: No edema, cyanosis, or clubbing. Neuro: Alert, answering all questions appropriately. Cranial nerves grossly intact. Skin: No rashes or petechiae noted. Psych: Normal affect.   LAB RESULTS:  Lab Results  Component Value Date   NA 142 10/23/2019   K 3.3 (L) 10/23/2019   CL 109 10/23/2019   CO2 24 10/23/2019   GLUCOSE 82 10/23/2019   BUN 13 10/23/2019   CREATININE 1.07 (H) 10/23/2019   CALCIUM 9.4 10/23/2019   PROT 7.1 10/23/2019   ALBUMIN 3.9 10/23/2019   AST 16 10/23/2019   ALT 12 10/23/2019   ALKPHOS 72 10/23/2019   BILITOT 1.0 10/23/2019   GFRNONAA 53 (L) 10/23/2019   GFRAA >60 10/23/2019    Lab Results  Component Value Date   WBC 7.3 10/23/2019   NEUTROABS 5.4 10/23/2019   HGB 11.4 (L) 10/23/2019   HCT 32.6 (L) 10/23/2019   MCV 82.3 10/23/2019   PLT 234 10/23/2019     STUDIES: No results found.  ASSESSMENT: Stage IIa ER+ left breast cancer  with no breast lesion and axillary lymph node metastasis. (TxN1M0)  HER-2 negative.  Now with stage IIIa colon cancer.  PLAN:   1.  Stage IIIa colon cancer: Pathology report reviewed independently.  Patient's most recent imaging on August 07, 2019 with CT scans revealed no evidence of recurrent or progressive disease.  Previously, patient agreed to enroll in clinical trial.  Oxaliplatin was discontinued early secondary to worsening neuropathy.  Patient completed 12 cycles of chemotherapy on July 31, 2019.  Continue with maintenance Tecentriq for an additional 6 months completing treatment in July 2021.  Proceed with Tecentriq today.  Return to clinic in 2 weeks for further evaluation and continuation of treatment.  2. Stage IIa ER+ left breast cancer with no breast lesion and axillary lymph node metastasis: Despite no obvious breast lesion on mammogram or breast MRI, pathology and pattern of spread was consistent with primary breast cancer. CT and bone scan at time of diagnosis revealed no other evidence of malignancy.  She only received 3 cycles of Adriamycin and Cytoxan. Taxol was discontinued early as well secondary to persistent peripheral neuropathy. Patient completed her chemotherapy on Nov 19, 2014.  She elected not to pursue axillary node dissection given the potential morbidity of this procedure. It was also elected not to pursue adjuvant XRT given there was no primary breast lesion.  Patient completed approximately 4 years of treatment with letrozole, but this was discontinued secondary to enrolling in clinical trial for her newly diagnosed colon cancer.  Her most recent mammogram on July 04, 2019 was reported as BI-RADS 1.  Repeat in December 2021. 3.  Bone health: Patient's most recent bone mineral density on February 03, 2018 reported T score of -0.7 which is unchanged from 3 years prior.  Consider repeat bone mineral density in July 2021.  4.  Lynch syndrome: Genetic testing confirmed patient  has Lynch syndrome.  She reports her sister and son both have tested positive.  She plans to talk to the rest of her children about genetic testing.  Appreciate genetics input. 5.  Endometrial cancer: Patient has had a total hysterectomy. 6.  Back pain/sciatica: Continue monthly follow-up with pain clinic as scheduled. 7.  Peripheral neuropathy: Grade 3.  Chronic and unchanged.  Continue gabapentin 900 mg 3 times daily.  If she sees no improvements, can consider referral to neurology. 8.  Anemia: Chronic and unchanged.  Patient's hemoglobin is 11.4 today. 9.  Renal insufficiency: Resolved. 10.  Hypokalemia: Mild.  Potassium is 3.3 today.  Patient no longer takes supplementation and was previously given dietary modifications. 11.  Diarrhea: Resolved.  Continue OTC Imodium as needed.  12.  Fatigue: Chronic and unchanged.   13.  Hypotension: Improved.  Continue Norvasc 5 mg daily. 14.  Port associated clot: Patient has now completed greater than 3 months of Eliquis and has discontinued treatment.  Patient expressed understanding and was in agreement with this plan. She also understands that She can call clinic at any time with any questions, concerns, or complaints.   Breast cancer metastasized to axillary lymph node   Staging form: Breast, AJCC 7th Edition     Clinical stage from 10/22/2014: Stage Unknown (TX, N1, M0) - Signed by [name], MD on 10/22/2014   [name], MD   10/23/2019 10:35 AM

## 2019-10-23 ENCOUNTER — Other Ambulatory Visit: Payer: Self-pay

## 2019-10-23 ENCOUNTER — Inpatient Hospital Stay: Payer: Medicare HMO | Admitting: Oncology

## 2019-10-23 ENCOUNTER — Encounter: Payer: Self-pay | Admitting: *Deleted

## 2019-10-23 ENCOUNTER — Inpatient Hospital Stay: Payer: Medicare HMO | Attending: Oncology

## 2019-10-23 ENCOUNTER — Encounter: Payer: Self-pay | Admitting: Oncology

## 2019-10-23 ENCOUNTER — Inpatient Hospital Stay: Payer: Medicare HMO

## 2019-10-23 VITALS — BP 112/91 | HR 75 | Temp 96.5°F | Resp 18 | Wt 189.5 lb

## 2019-10-23 VITALS — BP 138/90 | HR 68 | Temp 97.0°F | Resp 18

## 2019-10-23 DIAGNOSIS — Z853 Personal history of malignant neoplasm of breast: Secondary | ICD-10-CM

## 2019-10-23 DIAGNOSIS — Z90710 Acquired absence of both cervix and uterus: Secondary | ICD-10-CM

## 2019-10-23 DIAGNOSIS — M5440 Lumbago with sciatica, unspecified side: Secondary | ICD-10-CM | POA: Insufficient documentation

## 2019-10-23 DIAGNOSIS — D638 Anemia in other chronic diseases classified elsewhere: Secondary | ICD-10-CM | POA: Diagnosis not present

## 2019-10-23 DIAGNOSIS — Z79899 Other long term (current) drug therapy: Secondary | ICD-10-CM | POA: Insufficient documentation

## 2019-10-23 DIAGNOSIS — Z1509 Genetic susceptibility to other malignant neoplasm: Secondary | ICD-10-CM | POA: Insufficient documentation

## 2019-10-23 DIAGNOSIS — G620 Drug-induced polyneuropathy: Secondary | ICD-10-CM | POA: Insufficient documentation

## 2019-10-23 DIAGNOSIS — R5383 Other fatigue: Secondary | ICD-10-CM

## 2019-10-23 DIAGNOSIS — Z006 Encounter for examination for normal comparison and control in clinical research program: Secondary | ICD-10-CM | POA: Diagnosis not present

## 2019-10-23 DIAGNOSIS — E876 Hypokalemia: Secondary | ICD-10-CM

## 2019-10-23 DIAGNOSIS — Z8542 Personal history of malignant neoplasm of other parts of uterus: Secondary | ICD-10-CM

## 2019-10-23 DIAGNOSIS — Z86718 Personal history of other venous thrombosis and embolism: Secondary | ICD-10-CM

## 2019-10-23 DIAGNOSIS — I959 Hypotension, unspecified: Secondary | ICD-10-CM

## 2019-10-23 DIAGNOSIS — C189 Malignant neoplasm of colon, unspecified: Secondary | ICD-10-CM | POA: Diagnosis not present

## 2019-10-23 DIAGNOSIS — T451X5A Adverse effect of antineoplastic and immunosuppressive drugs, initial encounter: Secondary | ICD-10-CM | POA: Insufficient documentation

## 2019-10-23 DIAGNOSIS — Z9221 Personal history of antineoplastic chemotherapy: Secondary | ICD-10-CM

## 2019-10-23 DIAGNOSIS — C184 Malignant neoplasm of transverse colon: Secondary | ICD-10-CM

## 2019-10-23 LAB — COMPREHENSIVE METABOLIC PANEL
ALT: 12 U/L (ref 0–44)
AST: 16 U/L (ref 15–41)
Albumin: 3.9 g/dL (ref 3.5–5.0)
Alkaline Phosphatase: 72 U/L (ref 38–126)
Anion gap: 9 (ref 5–15)
BUN: 13 mg/dL (ref 8–23)
CO2: 24 mmol/L (ref 22–32)
Calcium: 9.4 mg/dL (ref 8.9–10.3)
Chloride: 109 mmol/L (ref 98–111)
Creatinine, Ser: 1.07 mg/dL — ABNORMAL HIGH (ref 0.44–1.00)
GFR calc Af Amer: >60 mL/min (ref >60)
GFR calc non Af Amer: 53 mL/min — ABNORMAL LOW (ref >60)
Glucose, Bld: 82 mg/dL (ref 70–99)
Potassium: 3.3 mmol/L — ABNORMAL LOW (ref 3.5–5.1)
Sodium: 142 mmol/L (ref 135–145)
Total Bilirubin: 1 mg/dL (ref 0.3–1.2)
Total Protein: 7.1 g/dL (ref 6.5–8.1)

## 2019-10-23 LAB — CBC WITH DIFFERENTIAL/PLATELET
Abs Immature Granulocytes: 0.03 10*3/uL (ref 0.00–0.07)
Basophils Absolute: 0 10*3/uL (ref 0.0–0.1)
Basophils Relative: 1 %
Eosinophils Absolute: 0.1 10*3/uL (ref 0.0–0.5)
Eosinophils Relative: 1 %
HCT: 32.6 % — ABNORMAL LOW (ref 36.0–46.0)
Hemoglobin: 11.4 g/dL — ABNORMAL LOW (ref 12.0–15.0)
Immature Granulocytes: 0 %
Lymphocytes Relative: 19 %
Lymphs Abs: 1.4 10*3/uL (ref 0.7–4.0)
MCH: 28.8 pg (ref 26.0–34.0)
MCHC: 35 g/dL (ref 30.0–36.0)
MCV: 82.3 fL (ref 80.0–100.0)
Monocytes Absolute: 0.4 10*3/uL (ref 0.1–1.0)
Monocytes Relative: 6 %
Neutro Abs: 5.4 10*3/uL (ref 1.7–7.7)
Neutrophils Relative %: 73 %
Platelets: 234 10*3/uL (ref 150–400)
RBC: 3.96 MIL/uL (ref 3.87–5.11)
RDW: 15.3 % (ref 11.5–15.5)
WBC: 7.3 10*3/uL (ref 4.0–10.5)
nRBC: 0 % (ref 0.0–0.2)

## 2019-10-23 MED ORDER — SODIUM CHLORIDE 0.9 % IV SOLN
840.0000 mg | Freq: Once | INTRAVENOUS | Status: AC
  Administered 2019-10-23: 840 mg via INTRAVENOUS
  Filled 2019-10-23: qty 14

## 2019-10-23 MED ORDER — SODIUM CHLORIDE 0.9% FLUSH
10.0000 mL | Freq: Once | INTRAVENOUS | Status: AC
  Administered 2019-10-23: 10 mL via INTRAVENOUS
  Filled 2019-10-23: qty 10

## 2019-10-23 MED ORDER — SODIUM CHLORIDE 0.9 % IV SOLN
Freq: Once | INTRAVENOUS | Status: AC
  Filled 2019-10-23: qty 250

## 2019-10-23 MED ORDER — HEPARIN SOD (PORK) LOCK FLUSH 100 UNIT/ML IV SOLN
500.0000 [IU] | Freq: Once | INTRAVENOUS | Status: AC | PRN
  Administered 2019-10-23: 500 [IU]
  Filled 2019-10-23: qty 5

## 2019-10-23 MED ORDER — HEPARIN SOD (PORK) LOCK FLUSH 100 UNIT/ML IV SOLN
500.0000 [IU] | Freq: Once | INTRAVENOUS | Status: DC
  Filled 2019-10-23: qty 5

## 2019-10-23 NOTE — Research (Addendum)
Patient Carrie Alvarez presents to clinic this morning for consideration her C18D1 Huey Bienenstock only treatment on the [id number] [name] study. Patient reports she is feeling better today than she did 2 weeks ago. States her back pain remains intense at times, but she is not eligible to have another injection since she has already had 3 of them now. She is seen monthly in the pain management clinic for her back pain and pain medication management. Reports her hands are better and the normal color is returning, but states they still feel tender at times and she continues to experience some mild tingling and numbness bilaterally. Reports numbness and tingling in her feet as well and states they feel like they are swollen, even thought they do not look edematous. Dr. Grayland Ormond explained that this could be the neuropathy making her feet feel swollen at times. Dr. Grayland Ormond increased her gabapentin dose to 300mg  three times daily beginning two weeks ago and states if this does not help with the neuropathy, he will send her for a neurology consult. Patient states she is still experiencing a little more fatigue lately. She is using the wheelchair while in clinic as usual, but reports she is still able to ambulate independently using her cane at home. She had been taking Eliquis twice daily, but ran out about a week ago. Dr. Grayland Ormond states she can come off the Eliquis now since she has been taking it well over 3 months. She is afebrile and SpO2 was measured at 99% at rest, on room air and while while wearing a mask. Reports only having one bowel movements per day and her appetite remains poor, but she tries to eat regularly in spite of this and her weight is within usual range. Local labs were collected via port-a-cath this morning and CBC and chemistry values were reviewed by Dr. Grayland Ormond. Patient does report abdominal pain at times - usually mild and around her incision site, but not constant. Solicited adverse events  were assessed by this RN today, and she completed the PRO-CTCAE booklet in clinic. Dr. Grayland Ormond saw patient and determined she could proceed with her atezolizumab infusion today. Potassium is a little low this morning at 3.3 mmol/L. Serum creatinine level is slightly elevated today at 1.07mg /dL, and CrCl calculation is 41mL/min. Patient remains mildly anemic with Hgb of 11.4 g/dL. Other labs are WNL for patient to receive her treatment today. H&P completed by Dr. Grayland Ormond. Patient will receive C18 Atezolizumab as scheduled today. Solicited and other Adverse events with grade and attribution are as noted below:   Adverse Event Log  Study/Protocol: [id number] [name] Cycle: Cycle 17 Event Grade Onset Date Resolved Date Drug Name Attribution Treatment Comments  Diarrhea 1 08/25/2019 05/18/2019 01/10/2019 09/08/2019 07/14/2019 05/02/2019 n/a surgery Imodium  Reports only having one stool per day now   Abdominal pain 2 04/08/2019 01/10/2019 04/12/2019 04/08/2019 n/a surgery Oxycodone Reports pain resolved  Constipation 0      Denies  Nausea 1 10/09/2019 06/07/2019 10/10/2019 06/14/2019  unrelated  Began while in exam room  Fatigue 2 06/12/2018 approx   possible  Reports no energy in past 12 months  Anorexia 1 07/13/2008   unrelated  States this is her norm for several years  Cough 0        Dyspnea 0        Fever 1 06/07/2019 06/07/2019    Temp 99.6 in ED  UTI 0        Palmer-plantar erythrodesia 1 03/13/2019   probable otc cream  Bilateral palms & anterior fingers remain discolored & more tender today  Total biliruben 0        Neutrophils 0        TSH 0      No history of hypothyroidism  Sensory Peripheral Neuropathy 3 06/13/2019 07/10/2019  Oxaliplatin Oxaliplatin discontinued Continuous, affecting ability to grasp objects & ambulation, ADLs  Sensory Peripheral Neuropathy 2 07/11/2019 11/19/2014  06/13/2019  Paclitaxel Plus Oxaliplatin Gabapentin - dose increased today Numbness & Tingling in  fingertips, toes & feet bil. since taking Taxol in 2014  Hypokalemia 1 10/23/2019 09/25/2019 08/14/2019 07/17/2019 03/27/2019 02/27/2019  10/09/2019 08/28/2019 07/31/2019 04/17/2019 03/13/2019  probable none K+ is 3.3 mg/dL today  Anemia 1 08/28/2019 05/01/2019  06/26/2019    Hgb 11.7 today  Anemia 2 06/26/2019 02/27/2019 08/28/2019 04/17/2019  probable none   Elevated creatinine 1 08/28/2019 02/07/2019 10/09/2019 08/14/2019  unrelated  0.97mg /dL today  Platelet count   decreased 1 05/29/2019 03/27/2019 07/17/2019 04/17/2019  definitely none Plts  217,000 today  Possible Colitis 2 04/08/2019 04/12/2019  probable Steroids Resolved  Weight Loss 1 04/10/2019 04/17/2019  probable none Weight back to normal    Proteinuria 1 05/15/2019    none Urine dipstick only  Abdominal pain 1 10/23/2019 05/22/2019  10/09/2019  unlikely  Reports intermittent mild pain again  Hypokalemia 3 05/29/2019 06/26/2019  probable IV + oral K+ K+ is 2.9 mmol/L today  vomiting 1 06/06/2019 06/07/2019  unlikely  Resolved with IVFs  somnolence 2 06/05/2019 06/08/2019  unlikely    headache 2 06/05/2019 06/07/2019  unlikely  Had Toradol in ED  Port associated  thrombosis 3 07/10/2019   unlikely Eliquis Pt reported pain in right side of neck at port site  Hypotension 2 06/12/2019 07/17/2019  possible IV fluids Norvasc on hold  Chronic back & left leg pain 2 07/13/2017   unrelated  Seen monthly at pain management clinic  [facility], BSN, MHA, OCN 10/23/2019 9:54 AM

## 2019-10-24 LAB — CEA: CEA: 3.8 ng/mL (ref 0.0–4.7)

## 2019-11-03 ENCOUNTER — Encounter: Payer: Self-pay | Admitting: Oncology

## 2019-11-03 ENCOUNTER — Other Ambulatory Visit: Payer: Self-pay | Admitting: Emergency Medicine

## 2019-11-03 DIAGNOSIS — C184 Malignant neoplasm of transverse colon: Secondary | ICD-10-CM

## 2019-11-03 NOTE — Progress Notes (Signed)
Patient denies any pain or concerns done during pre assessment.

## 2019-11-04 NOTE — Progress Notes (Signed)
[facility]  [phone] [phone]) [phone]  ID: Carrie Alvarez OB: Nov 04, 1950  MR#: [id number]  OHY#:[id number]  Patient Care Team: Paulene Floor as PCP - General (Physician Assistant) [name], MD as Consulting Physician (Oncology) [name], MD as Attending Physician (Pain Medicine) [name]., MD as Consulting Physician (Ophthalmology) [name], RN as Oncology Nurse Navigator Normajean Glasgow, MD as Attending Physician (Physical Medicine and Rehabilitation)  CHIEF COMPLAINT: Stage IIa ER+ left breast cancer with no breast lesion and axillary lymph node metastasis.  Now with stage IIIa colon cancer.  INTERVAL HISTORY: Patient returns to clinic today for further evaluation and continuation of maintenance Tecentriq on clinical trial.  Her peripheral neuropathy is slightly improved in her right foot and hands, but unchanged in her left foot.  Her back pain and sciatica is also chronic and unchanged.  She continues to have chronic weakness and fatigue. She denies any recent fevers or illnesses.  She has no chest pain, shortness of breath, cough, or hemoptysis.  She denies any nausea, vomiting, constipation, or diarrhea.  She has no melena or hematochezia.  She has no urinary complaints.  Patient offers no further specific complaints today.  REVIEW OF SYSTEMS:   Review of Systems  Constitutional: Positive for malaise/fatigue. Negative for fever and weight loss.  Respiratory: Negative.  Negative for cough and shortness of breath.   Cardiovascular: Negative.  Negative for chest pain and leg swelling.  Gastrointestinal: Negative.  Negative for abdominal pain, blood in stool, constipation, diarrhea, melena, nausea and vomiting.  Genitourinary: Negative.  Negative for dysuria.  Musculoskeletal: Positive for back pain. Negative for neck pain.  Skin: Negative.  Negative for rash.  Neurological: Positive for tingling, sensory  change and weakness. Negative for dizziness, focal weakness and headaches.  Psychiatric/Behavioral: Negative.  Negative for depression. The patient is not nervous/anxious.     As per HPI. Otherwise, a complete review of systems is negative.  PAST MEDICAL HISTORY: Past Medical History:  Diagnosis Date  . Allergy   . Anemia   . Back pain   . Breast cancer ([place]) 2015   LT LUMPECTOMY 12-2014 FOLLOWING CHEMO  . Carpal tunnel syndrome    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  . Cataract   . Colon cancer ([place]) 01/2019  . Depression   . Family history of breast cancer   . Family history of colon cancer   . GERD (gastroesophageal reflux disease)    problem with reflux during chemo  . History of methicillin resistant staphylococcus aureus (MRSA)   . Hypercholesteremia   . Hypertension   . Lumbosacral pain    sees pain management in [place]  . Obesity   . Personal history of chemotherapy 2016   left breast ca  . Personal history of chemotherapy 2020   colon ca  . Pinched nerve    left side, lumbar area  . Uterine cancer ([place]) 1994    PAST SURGICAL HISTORY: Past Surgical History:  Procedure Laterality Date  . ABDOMINAL HYSTERECTOMY  1994   UTERINE CA  . AXILLARY LYMPH NODE BIOPSY Left 12/18/2014   Procedure: AXILLARY LYMPH NODE BIOPSY/;  Surgeon: Robert Bellow, MD;  Location: [facility];  Service: General;  Laterality: Left;  . AXILLARY LYMPH NODE DISSECTION Left 12/18/2014   Procedure: AXILLARY LYMPH NODE DISSECTION;  Surgeon: Robert Bellow, MD;  Location: [facility];  Service: General;  Laterality: Left;  . BREAST BIOPSY Left 05/2014   CORE  BX OF LN, METASTATIC ADENOCARCINOMA  . BREAST LUMPECTOMY Left 12/2014   CHEMO FIRST THEN SURGERY OF LN  . BREAST SURGERY     lymph node removal  . CHOLECYSTECTOMY N/A 04/19/2016   Procedure: LAPAROSCOPIC CHOLECYSTECTOMY WITH INTRAOPERATIVE CHOLANGIOGRAM;  Surgeon: Hubbard Robinson, MD;  Location: [facility];  Service: General;   Laterality: N/A;  . COLON RESECTION Right 01/10/2019   Procedure: HAND ASSISTED LAPAROSCOPIC RIGHT COLON RESECTION;  Surgeon: Jules Husbands, MD;  Location: [facility];  Service: General;  Laterality: Right;  . COLONOSCOPY WITH PROPOFOL N/A 12/30/2018   Procedure: COLONOSCOPY WITH PROPOFOL;  Surgeon: Lucilla Lame, MD;  Location: [facility];  Service: Endoscopy;  Laterality: N/A;  . medial branch block  11/06/2015   lumbar facet Dr. Primus Bravo  . POLYPECTOMY N/A 12/30/2018   Procedure: POLYPECTOMY;  Surgeon: Lucilla Lame, MD;  Location: [place] [facility];  Service: Endoscopy;  Laterality: N/A;  Clips placed at Hepatic Flexure Polyp (2) and Transverse Colon Polyp (4) removal sites  . PORTACATH PLACEMENT Right 02/02/2019   Procedure: INSERTION PORT-A-CATH;  Surgeon: Jules Husbands, MD;  Location: [facility];  Service: General;  Laterality: Right;  . SENTINEL NODE BIOPSY Left 12/18/2014   Procedure: SENTINEL NODE BIOPSY;  Surgeon: Robert Bellow, MD;  Location: [facility];  Service: General;  Laterality: Left;    FAMILY HISTORY Family History  Problem Relation Age of Onset  . Breast cancer Sister 57  . Colon cancer Sister   . Colon cancer Mother        dx 35s  . Hypertension Mother   . Asthma Mother   . Cancer Father        voice box removed  . Cancer Maternal Grandmother        unsure type  . Colon cancer Cousin   . Cancer Cousin        unsure type       ADVANCED DIRECTIVES:    HEALTH MAINTENANCE: Social History   Tobacco Use  . Smoking status: Former Smoker    Packs/day: 0.50    Types: Cigarettes    Quit date: 07/18/2014    Years since quitting: 5.3  . Smokeless tobacco: Never Used  Substance Use Topics  . Alcohol use: No    Alcohol/week: 0.0 standard drinks  . Drug use: No    No Known Allergies  Current Outpatient Medications  Medication Sig Dispense Refill  . acetaminophen (TYLENOL) 500 MG tablet Take 500 mg by mouth every 6 (six) hours as needed for mild pain or  moderate pain.     Marland Kitchen albuterol (PROVENTIL HFA;VENTOLIN HFA) 108 (90 Base) MCG/ACT inhaler Inhale 2 puffs into the lungs every 6 (six) hours as needed for wheezing or shortness of breath. 1 Inhaler 2  . amLODipine (NORVASC) 5 MG tablet Take 1 tablet (5 mg total) by mouth daily. MD re-ordered at decreased dose 30 tablet 5  . apixaban (ELIQUIS) 5 MG TABS tablet Take 1 tablet (5 mg total) by mouth 2 (two) times daily. 60 tablet 3  . atorvastatin (LIPITOR) 10 MG tablet TAKE 1 TABLET BY MOUTH IN THE MORNING 90 tablet 0  . baclofen (LIORESAL) 10 MG tablet Take 1 tablet (10 mg total) by mouth 2 (two) times daily. 30 each 0  . cholecalciferol (VITAMIN D) 1000 units tablet Take 1,000 Units by mouth daily.    . fluticasone (FLONASE) 50 MCG/ACT nasal spray Use 2 spray(s) in each nostril once daily 16 g 0  . gabapentin (  NEURONTIN) 300 MG capsule Take 300-600 mg by mouth 4 (four) times daily as needed (neuropathic pain).     Marland Kitchen letrozole (FEMARA) 2.5 MG tablet Take 2.5 mg by mouth daily.    Marland Kitchen lidocaine-prilocaine (EMLA) cream Apply to affected area once 30 g 3  . NARCAN 4 MG/0.1ML LIQD nasal spray kit Place 1 spray into the nose.    Marland Kitchen Oxycodone HCl 10 MG TABS LIMIT ONE HALF TO ONE TABLET BY MOUTH 3 TO 5 TIMES DAILY IF TOLERATED    . potassium chloride SA (KLOR-CON) 20 MEQ tablet Take 1 tablet (20 mEq total) by mouth 2 (two) times daily. 60 tablet 1  . pyridoxine (B-6) 100 MG tablet Take 100 mg by mouth daily.    . sertraline (ZOLOFT) 100 MG tablet Take 2 tablets by mouth once daily 180 tablet 0  . vitamin B-12 (CYANOCOBALAMIN) 500 MCG tablet Take 500 mcg by mouth daily.     No current facility-administered medications for this visit.   Facility-Administered Medications Ordered in Other Visits  Medication Dose Route Frequency Provider Last Rate Last Admin  . heparin lock flush 100 unit/mL  500 Units Intravenous Once [name], MD      . heparin lock flush 100 unit/mL  500 Units Intravenous Once  [name], MD      . heparin lock flush 100 unit/mL  500 Units Intravenous Once [name], MD      . heparin lock flush 100 unit/mL  500 Units Intracatheter Once PRN [name], MD      . INV-atezolizumab [facility] [phone] (TECENTRIQ) 840 mg in sodium chloride 0.9 % 250 mL chemo infusion  840 mg Intravenous Once [name], MD      . potassium chloride 20 mEq in 100 mL IVPB  20 mEq Intravenous Once [name], MD 100 mL/hr at 11/06/19 1000 20 mEq at 11/06/19 1000  . sodium chloride flush (NS) 0.9 % injection 10 mL  10 mL Intravenous PRN [name], MD   10 mL at 02/27/19 0925    OBJECTIVE: Vitals:   11/06/19 0928  BP: 110/83  Pulse: 64  Resp: 17  Temp: 98 F (36.7 C)  SpO2: 100%     Body mass index is 35.25 kg/m.    ECOG FS:1 - Symptomatic but completely ambulatory  General: Well-developed, well-nourished, no acute distress.  Sitting in a wheelchair. Eyes: Pink conjunctiva, anicteric sclera. HEENT: Normocephalic, moist mucous membranes. Lungs: No audible wheezing or coughing. Heart: Regular rate and rhythm. Abdomen: Soft, nontender, no obvious distention. Musculoskeletal: No edema, cyanosis, or clubbing. Neuro: Alert, answering all questions appropriately. Cranial nerves grossly intact. Skin: No rashes or petechiae noted. Psych: Normal affect.   LAB RESULTS:  Lab Results  Component Value Date   NA 142 11/06/2019   K 2.9 (L) 11/06/2019   CL 105 11/06/2019   CO2 28 11/06/2019   GLUCOSE 96 11/06/2019   BUN 9 11/06/2019   CREATININE 0.85 11/06/2019   CALCIUM 9.0 11/06/2019   PROT 6.6 11/06/2019   ALBUMIN 3.5 11/06/2019   AST 17 11/06/2019   ALT 15 11/06/2019   ALKPHOS 76 11/06/2019   BILITOT 0.8 11/06/2019   GFRNONAA >60 11/06/2019   GFRAA >60 11/06/2019    Lab Results  Component Value Date   WBC 7.0 11/06/2019   NEUTROABS 5.1 11/06/2019   HGB 10.4 (L) 11/06/2019   HCT 29.2 (L) 11/06/2019   MCV 80.9  11/06/2019   PLT 210  11/06/2019     STUDIES: No results found.  ASSESSMENT: Stage IIa ER+ left breast cancer with no breast lesion and axillary lymph node metastasis. (TxN1M0)  HER-2 negative.  Now with stage IIIa colon cancer.  PLAN:   1.  Stage IIIa colon cancer: Pathology report reviewed independently.  Patient's most recent imaging on August 07, 2019 with CT scans revealed no evidence of recurrent or progressive disease.  Previously, patient agreed to enroll in clinical trial.  Oxaliplatin was discontinued early secondary to worsening neuropathy.  Patient completed 12 cycles of chemotherapy on July 31, 2019.  Continue with maintenance Tecentriq for an additional 6 months completing treatment in July 2021.  Proceed with Tecentriq today.  Return to clinic in 2 weeks for further evaluation and continuation of treatment. 2. Stage IIa ER+ left breast cancer with no breast lesion and axillary lymph node metastasis: Despite no obvious breast lesion on mammogram or breast MRI, pathology and pattern of spread was consistent with primary breast cancer. CT and bone scan at time of diagnosis revealed no other evidence of malignancy.  She only received 3 cycles of Adriamycin and Cytoxan. Taxol was discontinued early as well secondary to persistent peripheral neuropathy. Patient completed her chemotherapy on Nov 19, 2014.  She elected not to pursue axillary node dissection given the potential morbidity of this procedure. It was also elected not to pursue adjuvant XRT given there was no primary breast lesion.  Patient completed approximately 4 years of treatment with letrozole, but this was discontinued secondary to enrolling in clinical trial for her newly diagnosed colon cancer.  Her most recent mammogram on July 04, 2019 was reported as BI-RADS 1.  Repeat in December 2021. 3.  Bone health: Patient's most recent bone mineral density on February 03, 2018 reported T score of -0.7 which is unchanged from 3  years prior.  Consider repeat bone mineral density in July 2021.  4.  Lynch syndrome: Genetic testing confirmed patient has Lynch syndrome.  She reports her sister and son both have tested positive.  She plans to talk to the rest of her children about genetic testing.  Appreciate genetics input. 5.  Endometrial cancer: Patient has had a total hysterectomy. 6.  Back pain/sciatica: Continue monthly follow-up with pain clinic as scheduled.  Patient reports insurance will only allow 3 injections every 6 months, therefore she cannot receive additional treatment until June 2021. 7.  Peripheral neuropathy: Grade 3.  Improved in her hands and right foot.  The residual neuropathy on her left lower extremity is likely related to her problems with sciatica. Continue gabapentin 900 mg 3 times daily.  If she sees no improvements, can consider referral to neurology. 8.  Anemia: Hemoglobin has trended down slightly to 10.4. 9.  Renal insufficiency: Resolved. 10.  Hypokalemia: Potassium was 2.9 today.  She will receive 20 mEq of IV potassium and has been instructed to reinitiate her oral potassium supplementation.  11.  Diarrhea: Resolved.  Continue OTC Imodium as needed.  12.  Fatigue: Chronic and unchanged.   13.  Hypotension: Resolved.  Continue Norvasc 5 mg daily. 14.  Port associated clot: Patient has now completed greater than 3 months of Eliquis and has discontinued treatment.  Patient expressed understanding and was in agreement with this plan. She also understands that She can call clinic at any time with any questions, concerns, or complaints.   Breast cancer metastasized to axillary lymph node   Staging form: Breast, AJCC 7th Edition     Clinical  stage from 10/22/2014: Stage Unknown (TX, N1, M0) - Signed by [name], MD on 10/22/2014   [name], MD   11/06/2019 10:01 AM

## 2019-11-06 ENCOUNTER — Inpatient Hospital Stay: Payer: Medicare HMO

## 2019-11-06 ENCOUNTER — Inpatient Hospital Stay: Payer: Medicare HMO | Admitting: Oncology

## 2019-11-06 ENCOUNTER — Other Ambulatory Visit: Payer: Self-pay

## 2019-11-06 ENCOUNTER — Encounter: Payer: Self-pay | Admitting: *Deleted

## 2019-11-06 VITALS — BP 149/94 | HR 59 | Temp 98.5°F | Resp 20

## 2019-11-06 VITALS — BP 110/83 | HR 64 | Temp 98.0°F | Resp 17 | Wt 199.0 lb

## 2019-11-06 DIAGNOSIS — E876 Hypokalemia: Secondary | ICD-10-CM | POA: Diagnosis not present

## 2019-11-06 DIAGNOSIS — C189 Malignant neoplasm of colon, unspecified: Secondary | ICD-10-CM

## 2019-11-06 DIAGNOSIS — Z9221 Personal history of antineoplastic chemotherapy: Secondary | ICD-10-CM | POA: Diagnosis not present

## 2019-11-06 DIAGNOSIS — Z86718 Personal history of other venous thrombosis and embolism: Secondary | ICD-10-CM | POA: Diagnosis not present

## 2019-11-06 DIAGNOSIS — Z1509 Genetic susceptibility to other malignant neoplasm: Secondary | ICD-10-CM | POA: Diagnosis not present

## 2019-11-06 DIAGNOSIS — D638 Anemia in other chronic diseases classified elsewhere: Secondary | ICD-10-CM | POA: Diagnosis not present

## 2019-11-06 DIAGNOSIS — C184 Malignant neoplasm of transverse colon: Secondary | ICD-10-CM | POA: Diagnosis not present

## 2019-11-06 DIAGNOSIS — R5383 Other fatigue: Secondary | ICD-10-CM | POA: Diagnosis not present

## 2019-11-06 DIAGNOSIS — I959 Hypotension, unspecified: Secondary | ICD-10-CM | POA: Diagnosis not present

## 2019-11-06 DIAGNOSIS — M5440 Lumbago with sciatica, unspecified side: Secondary | ICD-10-CM | POA: Diagnosis not present

## 2019-11-06 DIAGNOSIS — Z853 Personal history of malignant neoplasm of breast: Secondary | ICD-10-CM | POA: Diagnosis not present

## 2019-11-06 DIAGNOSIS — Z006 Encounter for examination for normal comparison and control in clinical research program: Secondary | ICD-10-CM | POA: Diagnosis present

## 2019-11-06 DIAGNOSIS — Z8542 Personal history of malignant neoplasm of other parts of uterus: Secondary | ICD-10-CM | POA: Diagnosis not present

## 2019-11-06 DIAGNOSIS — G620 Drug-induced polyneuropathy: Secondary | ICD-10-CM | POA: Diagnosis not present

## 2019-11-06 DIAGNOSIS — Z79899 Other long term (current) drug therapy: Secondary | ICD-10-CM | POA: Diagnosis not present

## 2019-11-06 DIAGNOSIS — T451X5A Adverse effect of antineoplastic and immunosuppressive drugs, initial encounter: Secondary | ICD-10-CM | POA: Diagnosis not present

## 2019-11-06 DIAGNOSIS — Z90710 Acquired absence of both cervix and uterus: Secondary | ICD-10-CM | POA: Diagnosis not present

## 2019-11-06 LAB — CBC WITH DIFFERENTIAL/PLATELET
Abs Immature Granulocytes: 0.03 10*3/uL (ref 0.00–0.07)
Basophils Absolute: 0 10*3/uL (ref 0.0–0.1)
Basophils Relative: 0 %
Eosinophils Absolute: 0.1 10*3/uL (ref 0.0–0.5)
Eosinophils Relative: 1 %
HCT: 29.2 % — ABNORMAL LOW (ref 36.0–46.0)
Hemoglobin: 10.4 g/dL — ABNORMAL LOW (ref 12.0–15.0)
Immature Granulocytes: 0 %
Lymphocytes Relative: 19 %
Lymphs Abs: 1.3 10*3/uL (ref 0.7–4.0)
MCH: 28.8 pg (ref 26.0–34.0)
MCHC: 35.6 g/dL (ref 30.0–36.0)
MCV: 80.9 fL (ref 80.0–100.0)
Monocytes Absolute: 0.5 10*3/uL (ref 0.1–1.0)
Monocytes Relative: 6 %
Neutro Abs: 5.1 10*3/uL (ref 1.7–7.7)
Neutrophils Relative %: 74 %
Platelets: 210 10*3/uL (ref 150–400)
RBC: 3.61 MIL/uL — ABNORMAL LOW (ref 3.87–5.11)
RDW: 15 % (ref 11.5–15.5)
WBC: 7 10*3/uL (ref 4.0–10.5)
nRBC: 0 % (ref 0.0–0.2)

## 2019-11-06 LAB — COMPREHENSIVE METABOLIC PANEL
ALT: 15 U/L (ref 0–44)
AST: 17 U/L (ref 15–41)
Albumin: 3.5 g/dL (ref 3.5–5.0)
Alkaline Phosphatase: 76 U/L (ref 38–126)
Anion gap: 9 (ref 5–15)
BUN: 9 mg/dL (ref 8–23)
CO2: 28 mmol/L (ref 22–32)
Calcium: 9 mg/dL (ref 8.9–10.3)
Chloride: 105 mmol/L (ref 98–111)
Creatinine, Ser: 0.85 mg/dL (ref 0.44–1.00)
GFR calc Af Amer: >60 mL/min (ref >60)
GFR calc non Af Amer: >60 mL/min (ref >60)
Glucose, Bld: 96 mg/dL (ref 70–99)
Potassium: 2.9 mmol/L — ABNORMAL LOW (ref 3.5–5.1)
Sodium: 142 mmol/L (ref 135–145)
Total Bilirubin: 0.8 mg/dL (ref 0.3–1.2)
Total Protein: 6.6 g/dL (ref 6.5–8.1)

## 2019-11-06 MED ORDER — HEPARIN SOD (PORK) LOCK FLUSH 100 UNIT/ML IV SOLN
500.0000 [IU] | Freq: Once | INTRAVENOUS | Status: AC | PRN
  Administered 2019-11-06: 500 [IU]
  Filled 2019-11-06: qty 5

## 2019-11-06 MED ORDER — SODIUM CHLORIDE 0.9 % IV SOLN
840.0000 mg | Freq: Once | INTRAVENOUS | Status: AC
  Administered 2019-11-06: 840 mg via INTRAVENOUS
  Filled 2019-11-06: qty 14

## 2019-11-06 MED ORDER — HEPARIN SOD (PORK) LOCK FLUSH 100 UNIT/ML IV SOLN
500.0000 [IU] | Freq: Once | INTRAVENOUS | Status: DC
  Filled 2019-11-06: qty 5

## 2019-11-06 MED ORDER — SODIUM CHLORIDE 0.9 % IV SOLN
Freq: Once | INTRAVENOUS | Status: AC
  Filled 2019-11-06: qty 250

## 2019-11-06 MED ORDER — POTASSIUM CHLORIDE 20 MEQ/100ML IV SOLN
20.0000 meq | Freq: Once | INTRAVENOUS | Status: AC
  Administered 2019-11-06: 20 meq via INTRAVENOUS

## 2019-11-06 MED ORDER — HEPARIN SOD (PORK) LOCK FLUSH 100 UNIT/ML IV SOLN
INTRAVENOUS | Status: AC
  Filled 2019-11-06: qty 5

## 2019-11-06 MED ORDER — SODIUM CHLORIDE 0.9% FLUSH
10.0000 mL | Freq: Once | INTRAVENOUS | Status: AC
  Administered 2019-11-06: 10 mL via INTRAVENOUS
  Filled 2019-11-06: qty 10

## 2019-11-06 NOTE — Research (Addendum)
Patient Carrie Alvarez presents to clinic this morning for consideration her C19D1 Huey Bienenstock only treatment on the [id number] [name] study. patient reports she is still feeling a little better, but her back pain remains intense at times. States  she is not eligible to have another injection in her back until June since she has already had 3 of them within the past 6 months now. She is seen monthly in the pain management clinic for her back pain and pain medication management. States her hands continue to improve and the normal color is returning, but states they still feel tender at times and she continues to experience some mild tingling and numbness bilaterally. Reports numbness and tingling in her feet has also improved - more-so in the right foot than the left. States she still feels something in the ball of her left foot and Dr. Grayland Ormond explained that this could be the known issues with the left side of her back causing her neuropathy to be worse in the left foot than it is in the right. . Patient states she is still experiencing a good measure fatigue lately, but admits that even this has improved just a little. Her fatigue is not really relieved by rest. She is using the wheelchair while in clinic as usual, but reports she is still able to ambulate independently using her cane at home. She remains afebrile and SpO2 was measured at 100% at rest, on room air and while while wearing a mask. Reports only having one bowel movements per day and her appetite remains poor, but she tries to eat regularly in spite of this and her weight is within usual range. Patient does report abdominal pain at times - usually mild and around her incision site, but not constant. Solicited adverse events were assessed by this RN today, and she completed the PRO-CTCAE booklet in clinic.  Local labs were collected via port-a-cath this morning and CBC and chemistry values were reviewed by Dr. Grayland Ormond. Potassium is low this  morning at 2.9 mmol/L and Dr. Grayland Ormond instructed patient to resume taking her potassium supplement and he will add IV potassium to her infusion this morning as well. Patient voices that she hates taking the huge potassium supplements and Dr. Grayland Ormond offered her a liquid alternative, but Ms. Twyford elected to continue to take the pills. Serum creatinine level is normal today at 0.85 mg/dL, and CrCl calculation is 68mL/min. Patient remains mildly anemic with Hgb of 10.4 g/dL. Other labs are WNL for patient to receive her treatment today. H&P completed by Dr. Grayland Ormond and patient will proceed with Cycle 19 Atezolizumab as well as IV potassium infusions. Patient will receive C19 Atezolizumab as scheduled today. Solicited and other Adverse events with grade and attribution are as noted below:   Adverse Event Log  Study/Protocol: [id number] [name] Cycle: Cycle 18 Event Grade Onset Date Resolved Date Drug Name Attribution Treatment Comments  Diarrhea 1 08/25/2019 05/18/2019 01/10/2019 09/08/2019 07/14/2019 05/02/2019 n/a surgery Imodium  Reports only having one stool per day now   Abdominal pain 2 04/08/2019 01/10/2019 04/12/2019 04/08/2019 n/a surgery Oxycodone Reports pain resolved  Constipation 0      Denies  Nausea 1 10/09/2019 06/07/2019 10/10/2019 06/14/2019  unrelated  Began while in exam room  Fatigue 2 06/12/2018 approx   possible  Reports no energy in past 12 months  Anorexia 1 07/13/2008   unrelated  States this is her norm for several years  Cough 0        Dyspnea 0  Fever 1 06/07/2019 06/07/2019    Temp 99.6 in ED  UTI 0        Palmer-plantar erythrodesia 1 03/13/2019   probable otc cream Bilateral palms & anterior fingers improved but still a little tender today  Total biliruben 0        Neutrophils 0        TSH 0      No history of hypothyroidism  Sensory Peripheral Neuropathy 3 06/13/2019 07/10/2019  Oxaliplatin Oxaliplatin discontinued Continuous, affecting ability to  grasp objects & ambulation, ADLs  Sensory Peripheral Neuropathy 2 07/11/2019 11/19/2014  06/13/2019  Paclitaxel Plus Oxaliplatin Gabapentin - dose increased today Numbness & Tingling in fingertips, toes & feet bil. since taking Taxol in 2014  Hypokalemia 1 10/23/2019 09/25/2019 08/14/2019 07/17/2019 03/27/2019 02/27/2019 11/06/2019 10/09/2019 08/28/2019 07/31/2019 04/17/2019 03/13/2019  probable none K+ is 3.3 mg/dL today  Anemia 1 08/28/2019 05/01/2019  06/26/2019  possible  Hgb 10.4 today  Anemia 2 06/26/2019 02/27/2019 08/28/2019 04/17/2019  probable none   Elevated creatinine 1 08/28/2019 02/07/2019 10/09/2019 08/14/2019  unrelated  0.97mg /dL today  Platelet count   decreased 1 05/29/2019 03/27/2019 07/17/2019 04/17/2019  definitely none Plts  217,000 today  Possible Colitis 2 04/08/2019 04/12/2019  probable Steroids Resolved  Weight Loss 1 04/10/2019 04/17/2019  probable none Weight back to normal    Proteinuria 1 05/15/2019    none Urine dipstick only  Abdominal pain 1 10/23/2019 05/22/2019  10/09/2019  unlikely  Reports intermittent mild pain again  Hypokalemia 3 11/06/2019 05/29/2019  06/26/2019  probable IV + oral K+ K+ is 2.9 mmol/L today  vomiting 1 06/06/2019 06/07/2019  unlikely  Resolved with IVFs  somnolence 2 06/05/2019 06/08/2019  unlikely    headache 2 06/05/2019 06/07/2019  unlikely  Had Toradol in ED  Port associated  thrombosis 3 07/10/2019 10/23/2019  unlikely Eliquis Pt stopped taking Eliquis on 10/16/2019  Hypotension 2 06/12/2019 07/17/2019  possible IV fluids Norvasc on hold  Chronic back & left leg pain 2 07/13/2017   unrelated  Seen monthly at pain management clinic  [facility], BSN, MHA, OCN 11/06/2019 10:03 AM

## 2019-11-07 LAB — THYROID PANEL WITH TSH
Free Thyroxine Index: 1.2 (ref 1.2–4.9)
T3 Uptake Ratio: 26 % (ref 24–39)
T4, Total: 4.5 ug/dL (ref 4.5–12.0)
TSH: 1.49 u[IU]/mL (ref 0.450–4.500)

## 2019-11-07 LAB — CEA: CEA: 4.9 ng/mL — ABNORMAL HIGH (ref 0.0–4.7)

## 2019-11-13 NOTE — Progress Notes (Signed)
Pharmacist Chemotherapy Monitoring - Follow Up Assessment    I verify that I have reviewed each item in the below checklist:  . Regimen for the patient is scheduled for the appropriate day and plan matches scheduled date. Marland Kitchen Appropriate non-routine labs are ordered dependent on drug ordered. . If applicable, additional medications reviewed and ordered per protocol based on lifetime cumulative doses and/or treatment regimen.   Plan for follow-up and/or issues identified: No . I-vent associated with next due treatment: No . MD and/or nursing notified: No  Carrie Alvarez K 11/13/2019 8:52 AM

## 2019-11-15 DIAGNOSIS — M5136 Other intervertebral disc degeneration, lumbar region: Secondary | ICD-10-CM | POA: Diagnosis not present

## 2019-11-15 DIAGNOSIS — G894 Chronic pain syndrome: Secondary | ICD-10-CM | POA: Diagnosis not present

## 2019-11-15 DIAGNOSIS — M542 Cervicalgia: Secondary | ICD-10-CM | POA: Diagnosis not present

## 2019-11-15 DIAGNOSIS — M545 Low back pain: Secondary | ICD-10-CM | POA: Diagnosis not present

## 2019-11-16 NOTE — Progress Notes (Signed)
Annual Wellness Visit     Patient: Carrie Alvarez, Female    DOB: 22-Nov-1950, 69 y.o.   MRN: [id number] Visit Date: 11/20/2019  Today's Provider: Trinna Post, PA-C   Fritzi Mandes Wolford,acting as a scribe for Trinna Post, PA-C.,have documented all relevant documentation on the behalf of Trinna Post, PA-C,as directed by  Trinna Post, PA-C while in the presence of Trinna Post, PA-C. Chief Complaint  Patient presents with  . Annual Exam  . Hypertension  . Hyperlipidemia   Subjective    Carrie Alvarez is a 69 y.o. female who presents today  for her Annual Wellness Visit. She reports consuming a general diet. The patient does not participate in regular exercise at present. She generally feels well. She reports sleeping well. She does not have additional problems to discuss today. Patient reports that she will be finished with chemo treatment in June-July 2021. Currently treated for stage III colon cancer with clinical trial that will end mid year. She has history of breast and endometrial cancer. She has tested positive for lynch syndrome.   HPI  AWE was w/ [facility] on 10/02/2019  Hypertension, follow-up  BP Readings from Last 3 Encounters:  11/20/19 122/90  11/20/19 117/81  11/20/19 92/76   She was last seen for hypertension 8 months ago.  BP at that visit was 102/76. Management since that includes reducing amlodipine to 5 mg QD due to hypotension. She is currently undergoing treatment for cancer.  She reports excellent compliance with treatment. She is not having side effects.  She is not exercising. She is not adherent to low salt diet.   Outside blood pressures are being checked periodically .  She does not smoke.  Use of agents associated with hypertension: none.   ----------------------------------------------------------------------------------------- Lipid/Cholesterol, follow-up  Last Lipid Panel: Lab Results  Component  Value Date   CHOL 139 03/23/2019   LDLCALC 48 03/23/2019   HDL 76 03/23/2019   TRIG 79 03/23/2019   ALT 16 11/20/2019   AST 19 11/20/2019   PLT 262 11/20/2019    She was last seen for this 8 months ago.  Management since that visit includes continue current medication.  She reports excellent compliance with treatment. She is not having side effects.   Symptoms: No appetite changes No foot ulcerations No chest pain No chest pressure/discomfort No dyspnea No fatigue No lower extremity edema No nausea No numbness or tingling of extremity, patient has history of neuropathy No orthopnea No palpitations No paroxysmal nocturnal dyspnea No polydipsia No polyuria No speech difficulty No syncope No visual disturbances   She is following a Regular diet. Current exercise: no regular exercise  Wt Readings from Last 3 Encounters:  11/20/19 190 lb 9.6 oz (86.5 kg)  11/20/19 189 lb 1.6 oz (85.8 kg)  11/06/19 199 lb (90.3 kg)   Last metabolic panel Lab Results  Component Value Date   GLUCOSE 139 (H) 11/20/2019   NA 140 11/20/2019   K 3.5 11/20/2019   BUN 22 11/20/2019   CREATININE 1.29 (H) 11/20/2019   GFRNONAA 42 (L) 11/20/2019   GFRAA 49 (L) 11/20/2019   CALCIUM 9.5 11/20/2019   AST 19 11/20/2019   ALT 16 11/20/2019   The 10-year ASCVD risk score Mikey Bussing DC Jr., et al., 2013) is: 14.5%  -----------------------------------------------------------------------------------------    Patient Active Problem List   Diagnosis Date Noted  . MLH1-related Lynch syndrome (HNPCC2) 06/01/2019  . Genetic testing 06/01/2019  . Family history  of breast cancer   . Family history of colon cancer   . Goals of care, counseling/discussion 01/22/2019  . Colon cancer ([place]) 01/10/2019  . Personal history of colonic polyps   . Polyp of transverse colon   . Neoplasm of digestive system   . Morbid obesity ([place]) 06/07/2018  . Acute cholecystitis   . Transaminitis   . Cholangitis  04/18/2016  . Allergic rhinitis 09/04/2015  . AA (alopecia areata) 09/04/2015  . Arthritis, degenerative 09/04/2015  . Cancer of uterus ([place]) 09/04/2015  . Avitaminosis D 09/04/2015  . Benign positional vertigo 09/04/2015  . Hypertension 03/15/2015  . DDD (degenerative disc disease), lumbar 03/07/2015  . Dizziness 12/25/2014  . Lumbosacral facet joint syndrome 12/10/2014  . Sacroiliac joint dysfunction 12/10/2014  . DDD (degenerative disc disease), lumbosacral 11/13/2014  . Depression 10/13/2014  . Breast cancer metastasized to axillary lymph node ([place]) 06/21/2014  . Hypercholesteremia 08/30/2009  . Compulsive tobacco user syndrome 04/21/2008  . Adaptation reaction 07/18/2007   Past Medical History:  Diagnosis Date  . Allergy   . Anemia   . Back pain   . Breast cancer ([place]) 2015   LT LUMPECTOMY 12-2014 FOLLOWING CHEMO  . Carpal tunnel syndrome    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  . Cataract   . Colon cancer ([place]) 01/2019  . Depression   . Family history of breast cancer   . Family history of colon cancer   . GERD (gastroesophageal reflux disease)    problem with reflux during chemo  . History of methicillin resistant staphylococcus aureus (MRSA)   . Hypercholesteremia   . Hypertension   . Lumbosacral pain    sees pain management in [place]  . Obesity   . Personal history of chemotherapy 2016   left breast ca  . Personal history of chemotherapy 2020   colon ca  . Pinched nerve    left side, lumbar area  . Uterine cancer ([place]) 1994   Past Surgical History:  Procedure Laterality Date  . ABDOMINAL HYSTERECTOMY  1994   UTERINE CA  . AXILLARY LYMPH NODE BIOPSY Left 12/18/2014   Procedure: AXILLARY LYMPH NODE BIOPSY/;  Surgeon: Robert Bellow, MD;  Location: [facility];  Service: General;  Laterality: Left;  . AXILLARY LYMPH NODE DISSECTION Left 12/18/2014   Procedure: AXILLARY LYMPH NODE DISSECTION;  Surgeon: Robert Bellow, MD;  Location: [facility];  Service:  General;  Laterality: Left;  . BREAST BIOPSY Left 05/2014   CORE BX OF LN, METASTATIC ADENOCARCINOMA  . BREAST LUMPECTOMY Left 12/2014   CHEMO FIRST THEN SURGERY OF LN  . BREAST SURGERY     lymph node removal  . CHOLECYSTECTOMY N/A 04/19/2016   Procedure: LAPAROSCOPIC CHOLECYSTECTOMY WITH INTRAOPERATIVE CHOLANGIOGRAM;  Surgeon: Hubbard Robinson, MD;  Location: [facility];  Service: General;  Laterality: N/A;  . COLON RESECTION Right 01/10/2019   Procedure: HAND ASSISTED LAPAROSCOPIC RIGHT COLON RESECTION;  Surgeon: Jules Husbands, MD;  Location: [facility];  Service: General;  Laterality: Right;  . COLONOSCOPY WITH PROPOFOL N/A 12/30/2018   Procedure: COLONOSCOPY WITH PROPOFOL;  Surgeon: Lucilla Lame, MD;  Location: [facility];  Service: Endoscopy;  Laterality: N/A;  . medial branch block  11/06/2015   lumbar facet Dr. Primus Bravo  . POLYPECTOMY N/A 12/30/2018   Procedure: POLYPECTOMY;  Surgeon: Lucilla Lame, MD;  Location: [facility];  Service: Endoscopy;  Laterality: N/A;  Clips placed at Hepatic Flexure Polyp (2) and Transverse Colon Polyp (4) removal sites  .  PORTACATH PLACEMENT Right 02/02/2019   Procedure: INSERTION PORT-A-CATH;  Surgeon: Jules Husbands, MD;  Location: [facility];  Service: General;  Laterality: Right;  . SENTINEL NODE BIOPSY Left 12/18/2014   Procedure: SENTINEL NODE BIOPSY;  Surgeon: Robert Bellow, MD;  Location: [facility];  Service: General;  Laterality: Left;   Social History   Tobacco Use  . Smoking status: Former Smoker    Packs/day: 0.50    Types: Cigarettes    Quit date: 07/18/2014    Years since quitting: 5.3  . Smokeless tobacco: Never Used  Substance Use Topics  . Alcohol use: No    Alcohol/week: 0.0 standard drinks  . Drug use: No   Family History  Problem Relation Age of Onset  . Breast cancer Sister 51  . Colon cancer Sister   . Colon cancer Mother        dx 87s  . Hypertension Mother   . Asthma Mother   . Cancer Father        voice  box removed  . Cancer Maternal Grandmother        unsure type  . Colon cancer Cousin   . Cancer Cousin        unsure type   No Known Allergies     Medications: Outpatient Medications Prior to Visit  Medication Sig  . acetaminophen (TYLENOL) 500 MG tablet Take 500 mg by mouth every 6 (six) hours as needed for mild pain or moderate pain.   Marland Kitchen albuterol (PROVENTIL HFA;VENTOLIN HFA) 108 (90 Base) MCG/ACT inhaler Inhale 2 puffs into the lungs every 6 (six) hours as needed for wheezing or shortness of breath.  Marland Kitchen amLODipine (NORVASC) 5 MG tablet Take 1 tablet (5 mg total) by mouth daily. MD re-ordered at decreased dose  . atorvastatin (LIPITOR) 10 MG tablet TAKE 1 TABLET BY MOUTH IN THE MORNING  . baclofen (LIORESAL) 10 MG tablet Take 1 tablet (10 mg total) by mouth 2 (two) times daily.  . cholecalciferol (VITAMIN D) 1000 units tablet Take 1,000 Units by mouth daily.  . diphenoxylate-atropine (LOMOTIL) 2.5-0.025 MG tablet Take 1 tablet by mouth 4 (four) times daily as needed for diarrhea or loose stools.  . fluticasone (FLONASE) 50 MCG/ACT nasal spray Use 2 spray(s) in each nostril once daily  . gabapentin (NEURONTIN) 300 MG capsule Take 300-600 mg by mouth 4 (four) times daily as needed (neuropathic pain).   Marland Kitchen letrozole (FEMARA) 2.5 MG tablet Take 2.5 mg by mouth daily.  Marland Kitchen lidocaine-prilocaine (EMLA) cream Apply to affected area once  . NARCAN 4 MG/0.1ML LIQD nasal spray kit Place 1 spray into the nose.  Marland Kitchen Oxycodone HCl 10 MG TABS LIMIT ONE HALF TO ONE TABLET BY MOUTH 3 TO 5 TIMES DAILY IF TOLERATED  . potassium chloride SA (KLOR-CON) 20 MEQ tablet Take 1 tablet (20 mEq total) by mouth 2 (two) times daily.  Marland Kitchen pyridoxine (B-6) 100 MG tablet Take 100 mg by mouth daily.  . sertraline (ZOLOFT) 100 MG tablet Take 2 tablets by mouth once daily  . vitamin B-12 (CYANOCOBALAMIN) 500 MCG tablet Take 500 mcg by mouth daily.   Facility-Administered Medications Prior to Visit  Medication Dose Route  Frequency Provider  . heparin lock flush 100 unit/mL  500 Units Intravenous Once [name], MD  . heparin lock flush 100 unit/mL  500 Units Intravenous Once [name], MD  . sodium chloride flush (NS) 0.9 % injection 10 mL  10 mL Intravenous PRN [name],  MD    No Known Allergies  Patient Care Team: Paulene Floor as PCP - General (Physician Assistant) [name], MD as Consulting Physician (Oncology) [name], MD as Attending Physician (Pain Medicine) [name]., MD as Consulting Physician (Ophthalmology) [name], RN as Oncology Nurse Navigator Normajean Glasgow, MD as Attending Physician (Physical Medicine and Rehabilitation)  Review of Systems  Constitutional: Negative.   HENT: Negative.   Eyes: Negative.   Respiratory: Negative.   Cardiovascular: Negative.   Gastrointestinal: Negative.   Endocrine: Negative.   Genitourinary: Negative.   Musculoskeletal: Positive for back pain.  Skin: Negative.   Allergic/Immunologic: Negative.   Neurological: Negative.   Hematological: Negative.   Psychiatric/Behavioral: Negative.     Last CBC Lab Results  Component Value Date   WBC 7.3 11/20/2019   HGB 12.3 11/20/2019   HCT 35.6 (L) 11/20/2019   MCV 83.6 11/20/2019   MCH 28.9 11/20/2019   RDW 14.8 11/20/2019   PLT 262 50/53/9767   Last metabolic panel Lab Results  Component Value Date   GLUCOSE 139 (H) 11/20/2019   NA 140 11/20/2019   K 3.5 11/20/2019   CL 106 11/20/2019   CO2 24 11/20/2019   BUN 22 11/20/2019   CREATININE 1.29 (H) 11/20/2019   GFRNONAA 42 (L) 11/20/2019   GFRAA 49 (L) 11/20/2019   CALCIUM 9.5 11/20/2019   PHOS 2.1 (L) 01/11/2019   PROT 7.6 11/20/2019   ALBUMIN 4.1 11/20/2019   LABGLOB 2.6 06/07/2018   AGRATIO 1.7 06/07/2018   BILITOT 0.7 11/20/2019   ALKPHOS 86 11/20/2019   AST 19 11/20/2019   ALT 16 11/20/2019   ANIONGAP 10 11/20/2019   Last lipids Lab Results  Component Value  Date   CHOL 139 03/23/2019   HDL 76 03/23/2019   LDLCALC 48 03/23/2019   TRIG 79 03/23/2019   CHOLHDL 1.8 03/23/2019   Last hemoglobin A1c No results found for: HGBA1C Last thyroid functions Lab Results  Component Value Date   TSH 1.490 11/06/2019   T4TOTAL 4.5 11/06/2019      Objective    Vitals: BP 122/90   Pulse 88   Temp (!) 90 F (32.2 C) (Oral)   Resp 15   Wt 190 lb 9.6 oz (86.5 kg)   SpO2 99%   BMI 33.76 kg/m    Physical Exam Vitals reviewed. Exam conducted with a chaperone present.  Constitutional:      Appearance: Normal appearance. She is normal weight.  HENT:     Head: Normocephalic and atraumatic.  Cardiovascular:     Rate and Rhythm: Normal rate and regular rhythm.     Heart sounds: Normal heart sounds.  Musculoskeletal:     Cervical back: Normal range of motion and neck supple.  Neurological:     General: No focal deficit present.     Mental Status: She is alert and oriented to person, place, and time.  Psychiatric:        Mood and Affect: Mood normal.        Behavior: Behavior normal.      Most recent functional status assessment: In your present state of health, do you have any difficulty performing the following activities: 10/02/2019  Hearing? N  Vision? Y  Comment Due to cataracts on both eyes. Has an eye exam scheduled 09/11/19.  Difficulty concentrating or making decisions? N  Walking or climbing stairs? Y  Comment Due to back and leg pain.  Dressing or bathing? N  Doing  errands, shopping? Y  Comment Does not drive currently due to vision issues.  Preparing Food and eating ? N  Using the Toilet? N  In the past six months, have you accidently leaked urine? N  Do you have problems with loss of bowel control? N  Managing your Medications? N  Managing your Finances? N  Housekeeping or managing your Housekeeping? N  Some recent data might be hidden    Most recent fall risk assessment: Fall Risk  10/02/2019  Falls in the past year? 0   Comment -  Number falls in past yr: 0  Injury with Fall? 0  Risk for fall due to : -  Follow up -     Most recent depression screenings: PHQ 2/9 Scores 10/02/2019 09/30/2018  PHQ - 2 Score 0 0  PHQ- 9 Score - 2  Exception Documentation - -    Most recent cognitive screening: 6CIT Screen 10/02/2019  What Year? 0 points  What month? 0 points  What time? 0 points  Count back from 20 0 points  Months in reverse 0 points  Repeat phrase 0 points  Total Score 0    Results for orders placed or performed in visit on 11/20/19  CBC with Differential/Platelet  Result Value Ref Range   WBC 7.3 4.0 - 10.5 K/uL   RBC 4.26 3.87 - 5.11 MIL/uL   Hemoglobin 12.3 12.0 - 15.0 g/dL   HCT 35.6 (L) 36.0 - 46.0 %   MCV 83.6 80.0 - 100.0 fL   MCH 28.9 26.0 - 34.0 pg   MCHC 34.6 30.0 - 36.0 g/dL   RDW 14.8 11.5 - 15.5 %   Platelets 262 150 - 400 K/uL   nRBC 0.0 0.0 - 0.2 %   Neutrophils Relative % 65 %   Neutro Abs 4.7 1.7 - 7.7 K/uL   Lymphocytes Relative 26 %   Lymphs Abs 1.9 0.7 - 4.0 K/uL   Monocytes Relative 7 %   Monocytes Absolute 0.5 0.1 - 1.0 K/uL   Eosinophils Relative 1 %   Eosinophils Absolute 0.1 0.0 - 0.5 K/uL   Basophils Relative 1 %   Basophils Absolute 0.0 0.0 - 0.1 K/uL   Immature Granulocytes 0 %   Abs Immature Granulocytes 0.03 0.00 - 0.07 K/uL  Comprehensive metabolic panel  Result Value Ref Range   Sodium 140 135 - 145 mmol/L   Potassium 3.5 3.5 - 5.1 mmol/L   Chloride 106 98 - 111 mmol/L   CO2 24 22 - 32 mmol/L   Glucose, Bld 139 (H) 70 - 99 mg/dL   BUN 22 8 - 23 mg/dL   Creatinine, Ser 1.29 (H) 0.44 - 1.00 mg/dL   Calcium 9.5 8.9 - 10.3 mg/dL   Total Protein 7.6 6.5 - 8.1 g/dL   Albumin 4.1 3.5 - 5.0 g/dL   AST 19 15 - 41 U/L   ALT 16 0 - 44 U/L   Alkaline Phosphatase 86 38 - 126 U/L   Total Bilirubin 0.7 0.3 - 1.2 mg/dL   GFR calc non Af Amer 42 (L) >60 mL/min   GFR calc Af Amer 49 (L) >60 mL/min   Anion gap 10 5 - 15    Assessment & Plan      Annual wellness visit done today including the all of the following: Reviewed patient's Family Medical History Reviewed and updated list of patient's medical providers Assessment of cognitive impairment was done Assessed patient's functional ability Established a written schedule for health  screening services Health Risk Assessent Completed and Reviewed  Exercise Activities and Dietary recommendations Goals    . DIET - INCREASE WATER INTAKE     Recommend increasing water intake to 6-8 glasses a day.       Immunization History  Administered Date(s) Administered  . Fluad Quad(high Dose 65+) 03/23/2019  . Influenza, High Dose Seasonal PF 06/07/2017, 06/07/2018  . Influenza,inj,Quad PF,6+ Mos 04/20/2016  . PFIZER SARS-COV-2 Vaccination 09/06/2019, 09/27/2019  . Pneumococcal Conjugate-13 09/22/2016  . Pneumococcal Polysaccharide-23 09/28/2017    Health Maintenance  Topic Date Due  . TETANUS/TDAP  07/13/2026 (Originally 09/21/1969)  . INFLUENZA VACCINE  02/11/2020  . MAMMOGRAM  07/03/2021  . COLONOSCOPY  12/29/2028  . DEXA SCAN  Completed  . COVID-19 Vaccine  Completed  . Hepatitis C Screening  Completed  . PNA vac Low Risk Adult  Completed     Discussed health benefits of physical activity, and encouraged her to engage in regular exercise appropriate for her age and condition.   1. Annual physical exam  UTD on health maintenance, monitored by cancer center. F/u 1 year.  2. Essential hypertension  Stable, continue decreased dose of amlodipine 5 mg QD.     ITrinna Post, PA-C, have reviewed all documentation for this visit. The documentation on 11/20/19 for the exam, diagnosis, procedures, and orders are all accurate and complete.    Paulene Floor  [facility] [phone] (phone) [phone] (fax)  [place]

## 2019-11-17 NOTE — Progress Notes (Signed)
Carrie Alvarez  [phone] [phone]  ID: Norm Salt OB: Feb 18, 1951  MR#: [id number]  WNI#:[id number]  Patient Care Team: Paulene Floor as PCP - General (Physician Assistant) [name], MD as Consulting Physician (Oncology) [name], MD as Attending Physician (Pain Medicine) [name]., MD as Consulting Physician (Ophthalmology) [name], RN as Oncology Nurse Navigator Normajean Glasgow, MD as Attending Physician (Physical Medicine and Rehabilitation)  CHIEF COMPLAINT: Stage IIa ER+ left breast cancer with no breast lesion and axillary lymph node metastasis.  Now with stage IIIa colon cancer.  INTERVAL HISTORY: Patient returns to clinic today for further evaluation and continuation of maintenance Tecentriq on clinical trial.  Over the past week she has developed increasing diarrhea particularly after she eats that is not well controlled with Imodium.  She otherwise feels well. Her peripheral neuropathy is unchanged.  Her back pain and sciatica is also chronic and unchanged.  She continues to have chronic weakness and fatigue. She denies any recent fevers or illnesses.  She has no chest pain, shortness of breath, cough, or hemoptysis.  She denies any nausea, vomiting, or constipation. She has no melena or hematochezia.  She has no urinary complaints.  Patient offers no further specific complaints today.  REVIEW OF SYSTEMS:   Review of Systems  Constitutional: Positive for malaise/fatigue. Negative for fever and weight loss.  Respiratory: Negative.  Negative for cough and shortness of breath.   Cardiovascular: Negative.  Negative for chest pain and leg swelling.  Gastrointestinal: Positive for diarrhea. Negative for abdominal pain, blood in stool, constipation, melena, nausea and vomiting.  Genitourinary: Negative.  Negative for dysuria.  Musculoskeletal: Positive for back pain. Negative for neck pain.  Skin:  Negative.  Negative for rash.  Neurological: Positive for tingling, sensory change and weakness. Negative for dizziness, focal weakness and headaches.  Psychiatric/Behavioral: Negative.  Negative for depression. The patient is not nervous/anxious.     As per HPI. Otherwise, a complete review of systems is negative.  PAST MEDICAL HISTORY: Past Medical History:  Diagnosis Date  . Allergy   . Anemia   . Back pain   . Breast cancer ([place]) 2015   LT LUMPECTOMY 12-2014 FOLLOWING CHEMO  . Carpal tunnel syndrome    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  . Cataract   . Colon cancer ([place]) 01/2019  . Depression   . Family history of breast cancer   . Family history of colon cancer   . GERD (gastroesophageal reflux disease)    problem with reflux during chemo  . History of methicillin resistant staphylococcus aureus (MRSA)   . Hypercholesteremia   . Hypertension   . Lumbosacral pain    sees pain management in [place]  . Obesity   . Personal history of chemotherapy 2016   left breast ca  . Personal history of chemotherapy 2020   colon ca  . Pinched nerve    left side, lumbar area  . Uterine cancer ([place]) 1994    PAST SURGICAL HISTORY: Past Surgical History:  Procedure Laterality Date  . ABDOMINAL HYSTERECTOMY  1994   UTERINE CA  . AXILLARY LYMPH NODE BIOPSY Left 12/18/2014   Procedure: AXILLARY LYMPH NODE BIOPSY/;  Surgeon: Robert Bellow, MD;  Location: [facility];  Service: General;  Laterality: Left;  . AXILLARY LYMPH NODE DISSECTION Left 12/18/2014   Procedure: AXILLARY LYMPH NODE DISSECTION;  Surgeon: Robert Bellow, MD;  Location: [facility];  Service: General;  Laterality:  Left;  . BREAST BIOPSY Left 05/2014   CORE BX OF LN, METASTATIC ADENOCARCINOMA  . BREAST LUMPECTOMY Left 12/2014   CHEMO FIRST THEN SURGERY OF LN  . BREAST SURGERY     lymph node removal  . CHOLECYSTECTOMY N/A 04/19/2016   Procedure: LAPAROSCOPIC CHOLECYSTECTOMY WITH INTRAOPERATIVE CHOLANGIOGRAM;   Surgeon: Hubbard Robinson, MD;  Location: [facility];  Service: General;  Laterality: N/A;  . COLON RESECTION Right 01/10/2019   Procedure: HAND ASSISTED LAPAROSCOPIC RIGHT COLON RESECTION;  Surgeon: Jules Husbands, MD;  Location: [facility];  Service: General;  Laterality: Right;  . COLONOSCOPY WITH PROPOFOL N/A 12/30/2018   Procedure: COLONOSCOPY WITH PROPOFOL;  Surgeon: Lucilla Lame, MD;  Location: [place];  Service: Endoscopy;  Laterality: N/A;  . medial branch block  11/06/2015   lumbar facet Dr. Primus Bravo  . POLYPECTOMY N/A 12/30/2018   Procedure: POLYPECTOMY;  Surgeon: Lucilla Lame, MD;  Location: [place];  Service: Endoscopy;  Laterality: N/A;  Clips placed at Hepatic Flexure Polyp (2) and Transverse Colon Polyp (4) removal sites  . PORTACATH PLACEMENT Right 02/02/2019   Procedure: INSERTION PORT-A-CATH;  Surgeon: Jules Husbands, MD;  Location: [facility];  Service: General;  Laterality: Right;  . SENTINEL NODE BIOPSY Left 12/18/2014   Procedure: SENTINEL NODE BIOPSY;  Surgeon: Robert Bellow, MD;  Location: [facility];  Service: General;  Laterality: Left;    FAMILY HISTORY Family History  Problem Relation Age of Onset  . Breast cancer Sister 66  . Colon cancer Sister   . Colon cancer Mother        dx 2s  . Hypertension Mother   . Asthma Mother   . Cancer Father        voice box removed  . Cancer Maternal Grandmother        unsure type  . Colon cancer Cousin   . Cancer Cousin        unsure type       ADVANCED DIRECTIVES:    HEALTH MAINTENANCE: Social History   Tobacco Use  . Smoking status: Former Smoker    Packs/day: 0.50    Types: Cigarettes    Quit date: 07/18/2014    Years since quitting: 5.3  . Smokeless tobacco: Never Used  Substance Use Topics  . Alcohol use: No    Alcohol/week: 0.0 standard drinks  . Drug use: No    No Known Allergies  Current Outpatient Medications  Medication Sig Dispense Refill  . acetaminophen (TYLENOL) 500 MG  tablet Take 500 mg by mouth every 6 (six) hours as needed for mild pain or moderate pain.     Marland Kitchen albuterol (PROVENTIL HFA;VENTOLIN HFA) 108 (90 Base) MCG/ACT inhaler Inhale 2 puffs into the lungs every 6 (six) hours as needed for wheezing or shortness of breath. 1 Inhaler 2  . amLODipine (NORVASC) 5 MG tablet Take 1 tablet (5 mg total) by mouth daily. MD re-ordered at decreased dose 30 tablet 5  . atorvastatin (LIPITOR) 10 MG tablet TAKE 1 TABLET BY MOUTH IN THE MORNING 90 tablet 0  . baclofen (LIORESAL) 10 MG tablet Take 1 tablet (10 mg total) by mouth 2 (two) times daily. 30 each 0  . cholecalciferol (VITAMIN D) 1000 units tablet Take 1,000 Units by mouth daily.    . fluticasone (FLONASE) 50 MCG/ACT nasal spray Use 2 spray(s) in each nostril once daily 16 g 0  . gabapentin (NEURONTIN) 300 MG capsule Take 300-600 mg by mouth 4 (four) times daily  as needed (neuropathic pain).     Marland Kitchen letrozole (FEMARA) 2.5 MG tablet Take 2.5 mg by mouth daily.    Marland Kitchen lidocaine-prilocaine (EMLA) cream Apply to affected area once 30 g 3  . NARCAN 4 MG/0.1ML LIQD nasal spray kit Place 1 spray into the nose.    Marland Kitchen Oxycodone HCl 10 MG TABS LIMIT ONE HALF TO ONE TABLET BY MOUTH 3 TO 5 TIMES DAILY IF TOLERATED    . potassium chloride SA (KLOR-CON) 20 MEQ tablet Take 1 tablet (20 mEq total) by mouth 2 (two) times daily. 60 tablet 1  . pyridoxine (B-6) 100 MG tablet Take 100 mg by mouth daily.    . sertraline (ZOLOFT) 100 MG tablet Take 2 tablets by mouth once daily 180 tablet 0  . vitamin B-12 (CYANOCOBALAMIN) 500 MCG tablet Take 500 mcg by mouth daily.    . diphenoxylate-atropine (LOMOTIL) 2.5-0.025 MG tablet Take 1 tablet by mouth 4 (four) times daily as needed for diarrhea or loose stools. 30 tablet 2   No current facility-administered medications for this visit.   Facility-Administered Medications Ordered in Other Visits  Medication Dose Route Frequency Provider Last Rate Last Admin  . heparin lock flush 100 unit/mL  500  Units Intravenous Once [name], MD      . heparin lock flush 100 unit/mL  500 Units Intravenous Once [name], MD      . sodium chloride flush (NS) 0.9 % injection 10 mL  10 mL Intravenous PRN [name], MD   10 mL at 02/27/19 0925  . sodium chloride flush (NS) 0.9 % injection 10 mL  10 mL Intravenous PRN [name], MD   10 mL at 11/20/19 0925    OBJECTIVE: Vitals:   11/20/19 1006  BP: 92/76  Pulse: 85  Resp: 20  Temp: (!) 95.5 F (35.3 C)  SpO2: 100%     Body mass index is 33.5 kg/m.    ECOG FS:1 - Symptomatic but completely ambulatory  General: Well-developed, well-nourished, no acute distress.  Sitting in a wheelchair. Eyes: Pink conjunctiva, anicteric sclera. HEENT: Normocephalic, moist mucous membranes. Lungs: No audible wheezing or coughing. Heart: Regular rate and rhythm. Abdomen: Soft, nontender, no obvious distention. Musculoskeletal: No edema, cyanosis, or clubbing. Neuro: Alert, answering all questions appropriately. Cranial nerves grossly intact. Skin: No rashes or petechiae noted. Psych: Normal affect.   LAB RESULTS:  Lab Results  Component Value Date   NA 140 11/20/2019   K 3.5 11/20/2019   CL 106 11/20/2019   CO2 24 11/20/2019   GLUCOSE 139 (H) 11/20/2019   BUN 22 11/20/2019   CREATININE 1.29 (H) 11/20/2019   CALCIUM 9.5 11/20/2019   PROT 7.6 11/20/2019   ALBUMIN 4.1 11/20/2019   AST 19 11/20/2019   ALT 16 11/20/2019   ALKPHOS 86 11/20/2019   BILITOT 0.7 11/20/2019   GFRNONAA 42 (L) 11/20/2019   GFRAA 49 (L) 11/20/2019    Lab Results  Component Value Date   WBC 7.3 11/20/2019   NEUTROABS 4.7 11/20/2019   HGB 12.3 11/20/2019   HCT 35.6 (L) 11/20/2019   MCV 83.6 11/20/2019   PLT 262 11/20/2019     STUDIES: No results found.  ASSESSMENT: Stage IIa ER+ left breast cancer with no breast lesion and axillary lymph node metastasis. (TxN1M0)  HER-2 negative.  Now with stage IIIa colon cancer.  PLAN:     1.  Stage IIIa colon cancer: Pathology report reviewed independently.  Patient's most recent imaging  on August 07, 2019 with CT scans revealed no evidence of recurrent or progressive disease.  Previously, patient agreed to enroll in clinical trial.  Oxaliplatin was discontinued early secondary to worsening neuropathy.  Patient completed 12 cycles of chemotherapy on July 31, 2019.  Continue with maintenance Tecentriq for an additional 6 months tentatively completing treatment on January 29, 2020.  Proceed with Tecentriq today.  Return to clinic in 2 weeks for further evaluation and continuation of treatment.   2. Stage IIa ER+ left breast cancer with no breast lesion and axillary lymph node metastasis: Despite no obvious breast lesion on mammogram or breast MRI, pathology and pattern of spread was consistent with primary breast cancer. CT and bone scan at time of diagnosis revealed no other evidence of malignancy.  She only received 3 cycles of Adriamycin and Cytoxan. Taxol was discontinued early as well secondary to persistent peripheral neuropathy. Patient completed her chemotherapy on Nov 19, 2014.  She elected not to pursue axillary node dissection given the potential morbidity of this procedure. It was also elected not to pursue adjuvant XRT given there was no primary breast lesion.  Patient completed approximately 4 years of treatment with letrozole, but this was discontinued secondary to enrolling in clinical trial for her newly diagnosed colon cancer.  Her most recent mammogram on July 04, 2019 was reported as BI-RADS 1.  Repeat in December 2021. 3.  Bone health: Patient's most recent bone mineral density on February 03, 2018 reported T score of -0.7 which is unchanged from 3 years prior.  Consider repeat bone mineral density in July 2021.  4.  Lynch syndrome: Genetic testing confirmed patient has Lynch syndrome.  She reports her sister and son both have tested positive.  She plans to talk to the rest  of her children about genetic testing.  Appreciate genetics input. 5.  Endometrial cancer: Patient has had a total hysterectomy. 6.  Back pain/sciatica: Continue monthly follow-up with pain clinic as scheduled.  Patient reports insurance will only allow 3 injections every 6 months, therefore she cannot receive additional treatment until June 2021. 7.  Peripheral neuropathy: Grade 3.  Improved in her hands and right foot.  The residual neuropathy on her left lower extremity is likely related to her problems with sciatica. Continue gabapentin 900 mg 3 times daily.  If she sees no improvements, can consider referral to neurology. 8.  Anemia: Resolved.  Patient's most recent hemoglobin was 12.3. 9.  Renal insufficiency: Mild, creatinine is trended up to 1.29. 10.  Hypokalemia: Resolved.  Continue oral potassium supplementation.  11.  Diarrhea: Patient was given a prescription for Lomotil today.  If this does not resolve in the next several days, can consider prednisone taper. 12.  Fatigue: Chronic and unchanged.   13.  Hypotension: Resolved.  Continue Norvasc 5 mg daily. 14.  Port associated clot: Patient has now completed greater than 3 months of Eliquis and has discontinued treatment.  Patient expressed understanding and was in agreement with this plan. She also understands that She can call clinic at any time with any questions, concerns, or complaints.   Breast cancer metastasized to axillary lymph node   Staging form: Breast, AJCC 7th Edition     Clinical stage from 10/22/2014: Stage Unknown (TX, N1, M0) - Signed by [name], MD on 10/22/2014   [name], MD   11/20/2019 12:47 PM

## 2019-11-20 ENCOUNTER — Inpatient Hospital Stay: Payer: Medicare HMO | Attending: Oncology

## 2019-11-20 ENCOUNTER — Ambulatory Visit (INDEPENDENT_AMBULATORY_CARE_PROVIDER_SITE_OTHER): Payer: Medicare HMO | Admitting: Physician Assistant

## 2019-11-20 ENCOUNTER — Encounter: Payer: Self-pay | Admitting: Physician Assistant

## 2019-11-20 ENCOUNTER — Other Ambulatory Visit: Payer: Self-pay

## 2019-11-20 ENCOUNTER — Encounter: Payer: Self-pay | Admitting: Oncology

## 2019-11-20 ENCOUNTER — Encounter: Payer: Self-pay | Admitting: *Deleted

## 2019-11-20 ENCOUNTER — Inpatient Hospital Stay (HOSPITAL_BASED_OUTPATIENT_CLINIC_OR_DEPARTMENT_OTHER): Payer: Medicare HMO | Admitting: Oncology

## 2019-11-20 ENCOUNTER — Inpatient Hospital Stay: Payer: Medicare HMO

## 2019-11-20 VITALS — BP 92/76 | HR 85 | Temp 95.5°F | Resp 20 | Wt 189.1 lb

## 2019-11-20 VITALS — BP 117/81 | HR 74 | Temp 96.0°F | Resp 18

## 2019-11-20 VITALS — BP 122/90 | HR 88 | Temp 90.0°F | Resp 15 | Wt 190.6 lb

## 2019-11-20 DIAGNOSIS — C184 Malignant neoplasm of transverse colon: Secondary | ICD-10-CM

## 2019-11-20 DIAGNOSIS — Z87891 Personal history of nicotine dependence: Secondary | ICD-10-CM

## 2019-11-20 DIAGNOSIS — C189 Malignant neoplasm of colon, unspecified: Secondary | ICD-10-CM | POA: Diagnosis present

## 2019-11-20 DIAGNOSIS — I10 Essential (primary) hypertension: Secondary | ICD-10-CM | POA: Diagnosis not present

## 2019-11-20 DIAGNOSIS — Z1509 Genetic susceptibility to other malignant neoplasm: Secondary | ICD-10-CM | POA: Insufficient documentation

## 2019-11-20 DIAGNOSIS — R5383 Other fatigue: Secondary | ICD-10-CM

## 2019-11-20 DIAGNOSIS — Z8542 Personal history of malignant neoplasm of other parts of uterus: Secondary | ICD-10-CM

## 2019-11-20 DIAGNOSIS — Z006 Encounter for examination for normal comparison and control in clinical research program: Secondary | ICD-10-CM

## 2019-11-20 DIAGNOSIS — Z0000 Encounter for general adult medical examination without abnormal findings: Secondary | ICD-10-CM | POA: Diagnosis not present

## 2019-11-20 DIAGNOSIS — Z90710 Acquired absence of both cervix and uterus: Secondary | ICD-10-CM

## 2019-11-20 DIAGNOSIS — R197 Diarrhea, unspecified: Secondary | ICD-10-CM | POA: Insufficient documentation

## 2019-11-20 DIAGNOSIS — N289 Disorder of kidney and ureter, unspecified: Secondary | ICD-10-CM | POA: Diagnosis not present

## 2019-11-20 DIAGNOSIS — G620 Drug-induced polyneuropathy: Secondary | ICD-10-CM

## 2019-11-20 DIAGNOSIS — M5442 Lumbago with sciatica, left side: Secondary | ICD-10-CM | POA: Diagnosis not present

## 2019-11-20 DIAGNOSIS — T451X5A Adverse effect of antineoplastic and immunosuppressive drugs, initial encounter: Secondary | ICD-10-CM | POA: Insufficient documentation

## 2019-11-20 DIAGNOSIS — Z79899 Other long term (current) drug therapy: Secondary | ICD-10-CM | POA: Insufficient documentation

## 2019-11-20 DIAGNOSIS — Z853 Personal history of malignant neoplasm of breast: Secondary | ICD-10-CM

## 2019-11-20 DIAGNOSIS — G629 Polyneuropathy, unspecified: Secondary | ICD-10-CM | POA: Insufficient documentation

## 2019-11-20 DIAGNOSIS — Z95828 Presence of other vascular implants and grafts: Secondary | ICD-10-CM

## 2019-11-20 DIAGNOSIS — D649 Anemia, unspecified: Secondary | ICD-10-CM | POA: Insufficient documentation

## 2019-11-20 LAB — COMPREHENSIVE METABOLIC PANEL
ALT: 16 U/L (ref 0–44)
AST: 19 U/L (ref 15–41)
Albumin: 4.1 g/dL (ref 3.5–5.0)
Alkaline Phosphatase: 86 U/L (ref 38–126)
Anion gap: 10 (ref 5–15)
BUN: 22 mg/dL (ref 8–23)
CO2: 24 mmol/L (ref 22–32)
Calcium: 9.5 mg/dL (ref 8.9–10.3)
Chloride: 106 mmol/L (ref 98–111)
Creatinine, Ser: 1.29 mg/dL — ABNORMAL HIGH (ref 0.44–1.00)
GFR calc Af Amer: 49 mL/min — ABNORMAL LOW (ref >60)
GFR calc non Af Amer: 42 mL/min — ABNORMAL LOW (ref >60)
Glucose, Bld: 139 mg/dL — ABNORMAL HIGH (ref 70–99)
Potassium: 3.5 mmol/L (ref 3.5–5.1)
Sodium: 140 mmol/L (ref 135–145)
Total Bilirubin: 0.7 mg/dL (ref 0.3–1.2)
Total Protein: 7.6 g/dL (ref 6.5–8.1)

## 2019-11-20 LAB — CBC WITH DIFFERENTIAL/PLATELET
Abs Immature Granulocytes: 0.03 10*3/uL (ref 0.00–0.07)
Basophils Absolute: 0 10*3/uL (ref 0.0–0.1)
Basophils Relative: 1 %
Eosinophils Absolute: 0.1 10*3/uL (ref 0.0–0.5)
Eosinophils Relative: 1 %
HCT: 35.6 % — ABNORMAL LOW (ref 36.0–46.0)
Hemoglobin: 12.3 g/dL (ref 12.0–15.0)
Immature Granulocytes: 0 %
Lymphocytes Relative: 26 %
Lymphs Abs: 1.9 10*3/uL (ref 0.7–4.0)
MCH: 28.9 pg (ref 26.0–34.0)
MCHC: 34.6 g/dL (ref 30.0–36.0)
MCV: 83.6 fL (ref 80.0–100.0)
Monocytes Absolute: 0.5 10*3/uL (ref 0.1–1.0)
Monocytes Relative: 7 %
Neutro Abs: 4.7 10*3/uL (ref 1.7–7.7)
Neutrophils Relative %: 65 %
Platelets: 262 10*3/uL (ref 150–400)
RBC: 4.26 MIL/uL (ref 3.87–5.11)
RDW: 14.8 % (ref 11.5–15.5)
WBC: 7.3 10*3/uL (ref 4.0–10.5)
nRBC: 0 % (ref 0.0–0.2)

## 2019-11-20 MED ORDER — HEPARIN SOD (PORK) LOCK FLUSH 100 UNIT/ML IV SOLN
500.0000 [IU] | Freq: Once | INTRAVENOUS | Status: AC | PRN
  Administered 2019-11-20: 500 [IU]
  Filled 2019-11-20: qty 5

## 2019-11-20 MED ORDER — DIPHENOXYLATE-ATROPINE 2.5-0.025 MG PO TABS
1.0000 | ORAL_TABLET | Freq: Four times a day (QID) | ORAL | 2 refills | Status: DC | PRN
Start: 2019-11-20 — End: 2020-03-20

## 2019-11-20 MED ORDER — SODIUM CHLORIDE 0.9 % IV SOLN
840.0000 mg | Freq: Once | INTRAVENOUS | Status: AC
  Administered 2019-11-20: 840 mg via INTRAVENOUS
  Filled 2019-11-20: qty 14

## 2019-11-20 MED ORDER — SODIUM CHLORIDE 0.9 % IV SOLN
Freq: Once | INTRAVENOUS | Status: AC
  Filled 2019-11-20: qty 250

## 2019-11-20 MED ORDER — HEPARIN SOD (PORK) LOCK FLUSH 100 UNIT/ML IV SOLN
INTRAVENOUS | Status: AC
  Filled 2019-11-20: qty 5

## 2019-11-20 MED ORDER — SODIUM CHLORIDE 0.9% FLUSH
10.0000 mL | INTRAVENOUS | Status: DC | PRN
  Administered 2019-11-20: 10 mL via INTRAVENOUS
  Filled 2019-11-20: qty 10

## 2019-11-20 NOTE — Patient Instructions (Signed)
Preventive Care 69 Years and Older, Female Preventive care refers to lifestyle choices and visits with your health care provider that can promote health and wellness. This includes:  A yearly physical exam. This is also called an annual well check.  Regular dental and eye exams.  Immunizations.  Screening for certain conditions.  Healthy lifestyle choices, such as diet and exercise. What can I expect for my preventive care visit? Physical exam Your health care provider will check:  Height and weight. These may be used to calculate body mass index (BMI), which is a measurement that tells if you are at a healthy weight.  Heart rate and blood pressure.  Your skin for abnormal spots. Counseling Your health care provider may ask you questions about:  Alcohol, tobacco, and drug use.  Emotional well-being.  Home and relationship well-being.  Sexual activity.  Eating habits.  History of falls.  Memory and ability to understand (cognition).  Work and work Statistician.  Pregnancy and menstrual history. What immunizations do I need?  Influenza (flu) vaccine  This is recommended every year. Tetanus, diphtheria, and pertussis (Tdap) vaccine  You may need a Td booster every 10 years. Varicella (chickenpox) vaccine  You may need this vaccine if you have not already been vaccinated. Zoster (shingles) vaccine  You may need this after age 33. Pneumococcal conjugate (PCV13) vaccine  One dose is recommended after age 33. Pneumococcal polysaccharide (PPSV23) vaccine  One dose is recommended after age 72. Measles, mumps, and rubella (MMR) vaccine  You may need at least one dose of MMR if you were born in 1957 or later. You may also need a second dose. Meningococcal conjugate (MenACWY) vaccine  You may need this if you have certain conditions. Hepatitis A vaccine  You may need this if you have certain conditions or if you travel or work in places where you may be exposed  to hepatitis A. Hepatitis B vaccine  You may need this if you have certain conditions or if you travel or work in places where you may be exposed to hepatitis B. Haemophilus influenzae type b (Hib) vaccine  You may need this if you have certain conditions. You may receive vaccines as individual doses or as more than one vaccine together in one shot (combination vaccines). Talk with your health care provider about the risks and benefits of combination vaccines. What tests do I need? Blood tests  Lipid and cholesterol levels. These may be checked every 5 years, or more frequently depending on your overall health.  Hepatitis C test.  Hepatitis B test. Screening  Lung cancer screening. You may have this screening every year starting at age 39 if you have a 30-pack-year history of smoking and currently smoke or have quit within the past 15 years.  Colorectal cancer screening. All adults should have this screening starting at age 36 and continuing until age 15. Your health care provider may recommend screening at age 23 if you are at increased risk. You will have tests every 1-10 years, depending on your results and the type of screening test.  Diabetes screening. This is done by checking your blood sugar (glucose) after you have not eaten for a while (fasting). You may have this done every 1-3 years.  Mammogram. This may be done every 1-2 years. Talk with your health care provider about how often you should have regular mammograms.  BRCA-related cancer screening. This may be done if you have a family history of breast, ovarian, tubal, or peritoneal cancers.  Other tests  Sexually transmitted disease (STD) testing.  Bone density scan. This is done to screen for osteoporosis. You may have this done starting at age 44. Follow these instructions at home: Eating and drinking  Eat a diet that includes fresh fruits and vegetables, whole grains, lean protein, and low-fat dairy products. Limit  your intake of foods with high amounts of sugar, saturated fats, and salt.  Take vitamin and mineral supplements as recommended by your health care provider.  Do not drink alcohol if your health care provider tells you not to drink.  If you drink alcohol: ? Limit how much you have to 0-1 drink a day. ? Be aware of how much alcohol is in your drink. In the [place] one drink equals one 12 oz bottle of beer (355 mL), one 5 oz glass of wine (148 mL), or one 1 oz glass of hard liquor (44 mL). Lifestyle  Take daily care of your teeth and gums.  Stay active. Exercise for at least 30 minutes on 5 or more days each week.  Do not use any products that contain nicotine or tobacco, such as cigarettes, e-cigarettes, and chewing tobacco. If you need help quitting, ask your health care provider.  If you are sexually active, practice safe sex. Use a condom or other form of protection in order to prevent STIs (sexually transmitted infections).  Talk with your health care provider about taking a low-dose aspirin or statin. What's next?  Go to your health care provider once a year for a well check visit.  Ask your health care provider how often you should have your eyes and teeth checked.  Stay up to date on all vaccines. This information is not intended to replace advice given to you by your health care provider. Make sure you discuss any questions you have with your health care provider. Document Revised: 06/23/2018 Document Reviewed: 06/23/2018 Elsevier Patient Education  2020 [employer].

## 2019-11-20 NOTE — Research (Signed)
Patient Carrie Alvarez presents to clinic this morning for consideration her C20D1 Huey Bienenstock only treatment on the [id number] [name] study. Patient her diarrhea has returned and states she is having 3-4 loose watery stools again - usually after she eats, but occasionally during the night. States this began about 11/10/19 and she does not know why this has started again. Reports she has been taking some OTC imodium, but this does not help much. Also reports her chronic back pain is intense at times, and fears she may need surgery for this at some point. States she is not eligible to have another injection in her back until June since she has already had 3 of them within the past 6 months now. She is seen monthly in the pain management clinic for her back pain and pain medication management. States her hands are almost back to normal and the color is returning, but states they still feel tender at times and she continues to experience some mild tingling and numbness bilaterally. Reports numbness and tingling in her feet has also improved - more-so in the right foot than the left. States she still feels something in the ball of her left foot and Dr. Grayland Ormond explained that this could be the known issues with the left side of her back causing her neuropathy to be worse in the left foot than it is in the right. Patient states she is still experiencing a good measure fatigue lately, and the diarrhea has increased this since her last visit. Her fatigue is not really relieved by rest. She is using the wheelchair while in clinic as usual, but reports she is still able to ambulate independently using her cane at home. [name] remains afebrile and SpO2 was measured at 100% at rest, on room air and while while wearing a mask. Her appetite remains poor, but she tries to eat regularly. Her weight is down today, but remains within usual range. Patient does continue to report intermittent abdominal pain, which remains  mild and around her incision site, but not constant. Solicited adverse events were assessed by this RN today, and she completed the PRO-CTCAE booklet in clinic prior to infusion. Local labs were collected via port-a-cath this morning and CBC and chemistry values were reviewed by Dr. Grayland Ormond. Potassium is back within normal limits this morning at 3.5 mmol/L and patient states she is taking her potassium supplement daily. Serum creatinine level is a little elevated again today at 0.85 mg/dL, and CrCl calculation is 55.6mL/min. Anemic resolved at present with Hgb of 12.3 g/dL this morning. Other labs are WNL for patient to receive her treatment today. H&P completed by Dr. Grayland Ormond and patient will proceed with Cycle 20 Atezolizumab as scheduled today. Solicited and other Adverse events with grade and attribution are as noted below:   Adverse Event Log  Study/Protocol: [id number] [name] Cycle: Cycle 19 Event Grade Onset Date Resolved Date Drug Name Attribution Treatment Comments  Diarrhea 1 11/10/2019 08/25/2019 05/18/2019 01/10/2019  09/08/2019 07/14/2019 05/02/2019 n/a surgery MD prescribed Lomotil now  States now having 3-4 loose/watery stools per day now  Abdominal pain 2 04/08/2019 01/10/2019 04/12/2019 04/08/2019 n/a surgery Oxycodone Reports pain resolved  Constipation 0      Denies  Nausea 1 10/09/2019 06/07/2019 10/10/2019 06/14/2019  unrelated    Fatigue 2 06/12/2018 approx   possible  Reports no energy in past 12 months  Anorexia 1 07/13/2008   unrelated  States this is her norm for several years  Cough 0  Dyspnea 0        Fever 1 06/07/2019 06/07/2019    Temp 99.6 in ED  UTI 0        Palmer-plantar erythrodesia 1 03/13/2019   probable otc cream Bilateral palms & anterior fingers improved but still a little tender today  Total biliruben 0        Neutrophils 0        TSH 0      No history of hypothyroidism  Sensory Peripheral Neuropathy 3 06/13/2019 07/10/2019  Oxaliplatin  Oxaliplatin discontinued   Sensory Peripheral Neuropathy 2 07/11/2019 11/19/2014  06/13/2019  Paclitaxel Plus Oxaliplatin Gabapentin - dose increased today Numbness & Tingling in fingertips, toes & feet bil. since taking Taxol in 2014  Hypokalemia 1 10/23/2019 09/25/2019 08/14/2019 07/17/2019 03/27/2019 02/27/2019 11/06/2019 10/09/2019 08/28/2019 07/31/2019 04/17/2019 03/13/2019  probable none   Anemia 1 08/28/2019 05/01/2019 11/20/2019 06/26/2019  possible  Hgb 12.3 today  Anemia 2 06/26/2019 02/27/2019 08/28/2019 04/17/2019  probable none   Elevated creatinine 1 11/20/2019 08/28/2019 02/07/2019  10/09/2019 08/14/2019  unrelated  1.29 mg/dL today  Platelet count   decreased 1 05/29/2019 03/27/2019 07/17/2019 04/17/2019  definitely none   Possible Colitis 2 04/08/2019 04/12/2019  probable Steroids Resolved  Weight Loss 1 04/10/2019 04/17/2019  probable none   Proteinuria 1 05/15/2019    none Urine dipstick only  Abdominal pain 1 10/23/2019 05/22/2019  10/09/2019  unlikely  Reports intermittent mild pain again  Hypokalemia 3 11/06/2019 05/29/2019 11/20/2019 06/26/2019  probable oral K+ K+ is 3.5 mmol/L today  vomiting 1 06/06/2019 06/07/2019  unlikely    somnolence 2 06/05/2019 06/08/2019  unlikely    headache 2 06/05/2019 06/07/2019  unlikely    Port associated  thrombosis 3 07/10/2019 10/23/2019  unlikely Eliquis Pt stopped taking Eliquis on 10/16/2019  Hypotension 2 06/12/2019 07/17/2019  possible IV fluids   Chronic back & left leg pain 2 07/13/2017   unrelated  Seen monthly at pain management clinic  [facility], BSN, MHA, OCN 11/20/2019 10:26 AM

## 2019-11-21 LAB — THYROID PANEL WITH TSH
Free Thyroxine Index: 1.3 (ref 1.2–4.9)
T3 Uptake Ratio: 28 % (ref 24–39)
T4, Total: 4.8 ug/dL (ref 4.5–12.0)
TSH: 1.98 u[IU]/mL (ref 0.450–4.500)

## 2019-11-21 LAB — CEA: CEA: 4.1 ng/mL (ref 0.0–4.7)

## 2019-11-22 ENCOUNTER — Telehealth: Payer: Self-pay | Admitting: *Deleted

## 2019-11-22 NOTE — Telephone Encounter (Signed)
T/C made to patient Carrie Alvarez at Dr. Gary Fleet request to follow up patient's report of increased diarrhea on 11/20/2019. Patient answered call and states she started taking the Lomotil Dr. Grayland Ormond called in for her and her diarrhea is "much better" now. Patient reports feeling a little better all around and she is to contact us if her diarrhea returns. [name], BSN, MHA, OCN 11/22/2019 2:14 PM

## 2019-11-27 NOTE — Progress Notes (Signed)

## 2019-12-02 NOTE — Progress Notes (Signed)
[facility]  [phone] [phone]) [phone]  ID: Carrie Alvarez OB: 23-Apr-1951  MR#: [id number]  WVP#:[id number]  Patient Care Team: Paulene Floor as PCP - General (Physician Assistant) [name], MD as Consulting Physician (Oncology) [name], MD as Attending Physician (Pain Medicine) [name]., MD as Consulting Physician (Ophthalmology) [name], RN as Oncology Nurse Navigator Normajean Glasgow, MD as Attending Physician (Physical Medicine and Rehabilitation)  CHIEF COMPLAINT: Stage IIa ER+ left breast cancer with no breast lesion and axillary lymph node metastasis.  Now with stage IIIa colon cancer.  INTERVAL HISTORY: Patient returns to clinic today for further evaluation and continuation of maintenance Tecentriq on clinical trial.  Her diarrhea has resolved.  Her peripheral neuropathy is unchanged.  She has increased back and left leg pain today.  She continues to have chronic weakness and fatigue. She denies any recent fevers or illnesses.  She has no chest pain, shortness of breath, cough, or hemoptysis.  She denies any nausea, vomiting, constipation, or diarrhea. She has no melena or hematochezia.  She has no urinary complaints.  Patient offers no further specific complaints today.  REVIEW OF SYSTEMS:   Review of Systems  Constitutional: Positive for malaise/fatigue. Negative for fever and weight loss.  Respiratory: Negative.  Negative for cough and shortness of breath.   Cardiovascular: Negative.  Negative for chest pain and leg swelling.  Gastrointestinal: Negative.  Negative for abdominal pain, blood in stool, constipation, diarrhea, melena, nausea and vomiting.  Genitourinary: Negative.  Negative for dysuria.  Musculoskeletal: Positive for back pain. Negative for neck pain.  Skin: Negative.  Negative for rash.  Neurological: Positive for tingling, sensory change and weakness. Negative for dizziness, focal  weakness and headaches.  Psychiatric/Behavioral: Negative.  Negative for depression. The patient is not nervous/anxious.     As per HPI. Otherwise, a complete review of systems is negative.  PAST MEDICAL HISTORY: Past Medical History:  Diagnosis Date  . Allergy   . Anemia   . Back pain   . Breast cancer ([place]) 2015   LT LUMPECTOMY 12-2014 FOLLOWING CHEMO  . Carpal tunnel syndrome    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  . Cataract   . Colon cancer ([place]) 01/2019  . Depression   . Family history of breast cancer   . Family history of colon cancer   . GERD (gastroesophageal reflux disease)    problem with reflux during chemo  . History of methicillin resistant staphylococcus aureus (MRSA)   . Hypercholesteremia   . Hypertension   . Lumbosacral pain    sees pain management in [place]  . Obesity   . Personal history of chemotherapy 2016   left breast ca  . Personal history of chemotherapy 2020   colon ca  . Pinched nerve    left side, lumbar area  . Uterine cancer ([place]) 1994    PAST SURGICAL HISTORY: Past Surgical History:  Procedure Laterality Date  . ABDOMINAL HYSTERECTOMY  1994   UTERINE CA  . AXILLARY LYMPH NODE BIOPSY Left 12/18/2014   Procedure: AXILLARY LYMPH NODE BIOPSY/;  Surgeon: Robert Bellow, MD;  Location: [facility];  Service: General;  Laterality: Left;  . AXILLARY LYMPH NODE DISSECTION Left 12/18/2014   Procedure: AXILLARY LYMPH NODE DISSECTION;  Surgeon: Robert Bellow, MD;  Location: [facility];  Service: General;  Laterality: Left;  . BREAST BIOPSY Left 05/2014   CORE BX OF LN, METASTATIC ADENOCARCINOMA  . BREAST LUMPECTOMY Left  12/2014   CHEMO FIRST THEN SURGERY OF LN  . BREAST SURGERY     lymph node removal  . CHOLECYSTECTOMY N/A 04/19/2016   Procedure: LAPAROSCOPIC CHOLECYSTECTOMY WITH INTRAOPERATIVE CHOLANGIOGRAM;  Surgeon: Hubbard Robinson, MD;  Location: [facility];  Service: General;  Laterality: N/A;  . COLON RESECTION Right 01/10/2019    Procedure: HAND ASSISTED LAPAROSCOPIC RIGHT COLON RESECTION;  Surgeon: Jules Husbands, MD;  Location: [facility];  Service: General;  Laterality: Right;  . COLONOSCOPY WITH PROPOFOL N/A 12/30/2018   Procedure: COLONOSCOPY WITH PROPOFOL;  Surgeon: Lucilla Lame, MD;  Location: [facility];  Service: Endoscopy;  Laterality: N/A;  . medial branch block  11/06/2015   lumbar facet Dr. Primus Bravo  . POLYPECTOMY N/A 12/30/2018   Procedure: POLYPECTOMY;  Surgeon: Lucilla Lame, MD;  Location: [place];  Service: Endoscopy;  Laterality: N/A;  Clips placed at Hepatic Flexure Polyp (2) and Transverse Colon Polyp (4) removal sites  . PORTACATH PLACEMENT Right 02/02/2019   Procedure: INSERTION PORT-A-CATH;  Surgeon: Jules Husbands, MD;  Location: [facility];  Service: General;  Laterality: Right;  . SENTINEL NODE BIOPSY Left 12/18/2014   Procedure: SENTINEL NODE BIOPSY;  Surgeon: Robert Bellow, MD;  Location: [facility];  Service: General;  Laterality: Left;    FAMILY HISTORY Family History  Problem Relation Age of Onset  . Breast cancer Sister 21  . Colon cancer Sister   . Colon cancer Mother        dx 81s  . Hypertension Mother   . Asthma Mother   . Cancer Father        voice box removed  . Cancer Maternal Grandmother        unsure type  . Colon cancer Cousin   . Cancer Cousin        unsure type       ADVANCED DIRECTIVES:    HEALTH MAINTENANCE: Social History   Tobacco Use  . Smoking status: Former Smoker    Packs/day: 0.50    Types: Cigarettes    Quit date: 07/18/2014    Years since quitting: 5.3  . Smokeless tobacco: Never Used  Substance Use Topics  . Alcohol use: No    Alcohol/week: 0.0 standard drinks  . Drug use: No    No Known Allergies  Current Outpatient Medications  Medication Sig Dispense Refill  . acetaminophen (TYLENOL) 500 MG tablet Take 500 mg by mouth every 6 (six) hours as needed for mild pain or moderate pain.     Marland Kitchen albuterol (PROVENTIL HFA;VENTOLIN  HFA) 108 (90 Base) MCG/ACT inhaler Inhale 2 puffs into the lungs every 6 (six) hours as needed for wheezing or shortness of breath. 1 Inhaler 2  . amLODipine (NORVASC) 5 MG tablet Take 1 tablet (5 mg total) by mouth daily. MD re-ordered at decreased dose 30 tablet 5  . atorvastatin (LIPITOR) 10 MG tablet TAKE 1 TABLET BY MOUTH IN THE MORNING 90 tablet 0  . baclofen (LIORESAL) 10 MG tablet Take 1 tablet (10 mg total) by mouth 2 (two) times daily. 30 each 0  . cholecalciferol (VITAMIN D) 1000 units tablet Take 1,000 Units by mouth daily.    . diphenoxylate-atropine (LOMOTIL) 2.5-0.025 MG tablet Take 1 tablet by mouth 4 (four) times daily as needed for diarrhea or loose stools. 30 tablet 2  . fluticasone (FLONASE) 50 MCG/ACT nasal spray Use 2 spray(s) in each nostril once daily 16 g 0  . gabapentin (NEURONTIN) 300 MG capsule Take 300-600 mg  by mouth 4 (four) times daily as needed (neuropathic pain).     Marland Kitchen letrozole (FEMARA) 2.5 MG tablet Take 2.5 mg by mouth daily.    Marland Kitchen lidocaine-prilocaine (EMLA) cream Apply to affected area once 30 g 3  . NARCAN 4 MG/0.1ML LIQD nasal spray kit Place 1 spray into the nose.    Marland Kitchen Oxycodone HCl 10 MG TABS LIMIT ONE HALF TO ONE TABLET BY MOUTH 3 TO 5 TIMES DAILY IF TOLERATED    . potassium chloride SA (KLOR-CON) 20 MEQ tablet Take 1 tablet (20 mEq total) by mouth 2 (two) times daily. 60 tablet 1  . pyridoxine (B-6) 100 MG tablet Take 100 mg by mouth daily.    . sertraline (ZOLOFT) 100 MG tablet Take 2 tablets by mouth once daily 180 tablet 0  . vitamin B-12 (CYANOCOBALAMIN) 500 MCG tablet Take 500 mcg by mouth daily.     No current facility-administered medications for this visit.   Facility-Administered Medications Ordered in Other Visits  Medication Dose Route Frequency Provider Last Rate Last Admin  . heparin lock flush 100 unit/mL  500 Units Intravenous Once [name], MD      . heparin lock flush 100 unit/mL  500 Units Intravenous Once [name], MD      . sodium chloride flush (NS) 0.9 % injection 10 mL  10 mL Intravenous PRN [name], MD   10 mL at 02/27/19 0925    OBJECTIVE: Vitals:   12/04/19 0957  BP: (!) 119/91  Pulse: 70  Temp: 97.8 F (36.6 C)  SpO2: 99%     Body mass index is 35.11 kg/m.    ECOG FS:1 - Symptomatic but completely ambulatory  General: Well-developed, well-nourished, no acute distress.  Sitting in a wheelchair. Eyes: Pink conjunctiva, anicteric sclera. HEENT: Normocephalic, moist mucous membranes. Lungs: No audible wheezing or coughing. Heart: Regular rate and rhythm. Abdomen: Soft, nontender, no obvious distention. Musculoskeletal: No edema, cyanosis, or clubbing. Neuro: Alert, answering all questions appropriately. Cranial nerves grossly intact. Skin: No rashes or petechiae noted. Psych: Normal affect.   LAB RESULTS:  Lab Results  Component Value Date   NA 141 12/04/2019   K 3.7 12/04/2019   CL 108 12/04/2019   CO2 25 12/04/2019   GLUCOSE 92 12/04/2019   BUN 12 12/04/2019   CREATININE 1.07 (H) 12/04/2019   CALCIUM 9.1 12/04/2019   PROT 7.0 12/04/2019   ALBUMIN 3.8 12/04/2019   AST 18 12/04/2019   ALT 17 12/04/2019   ALKPHOS 89 12/04/2019   BILITOT 0.5 12/04/2019   GFRNONAA 53 (L) 12/04/2019   GFRAA >60 12/04/2019    Lab Results  Component Value Date   WBC 7.3 12/04/2019   NEUTROABS 4.9 12/04/2019   HGB 10.9 (L) 12/04/2019   HCT 30.8 (L) 12/04/2019   MCV 81.3 12/04/2019   PLT 203 12/04/2019     STUDIES: No results found.  ASSESSMENT: Stage IIa ER+ left breast cancer with no breast lesion and axillary lymph node metastasis. (TxN1M0)  HER-2 negative.  Now with stage IIIa colon cancer.  PLAN:   1.  Stage IIIa colon cancer: Pathology report reviewed independently.  Patient's most recent imaging on August 07, 2019 with CT scans revealed no evidence of recurrent or progressive disease.  Previously, patient agreed to enroll in clinical trial.   Oxaliplatin was discontinued early secondary to worsening neuropathy.  Patient completed 12 cycles of chemotherapy on July 31, 2019.  Continue with maintenance Tecentriq for an additional  6 months tentatively completing treatment on January 29, 2020.  Proceed with Tecentriq today.  Return to clinic in 2 weeks for further evaluation and continuation of treatment.    2. Stage IIa ER+ left breast cancer with no breast lesion and axillary lymph node metastasis: Despite no obvious breast lesion on mammogram or breast MRI, pathology and pattern of spread was consistent with primary breast cancer. CT and bone scan at time of diagnosis revealed no other evidence of malignancy.  She only received 3 cycles of Adriamycin and Cytoxan. Taxol was discontinued early as well secondary to persistent peripheral neuropathy. Patient completed her chemotherapy on Nov 19, 2014.  She elected not to pursue axillary node dissection given the potential morbidity of this procedure. It was also elected not to pursue adjuvant XRT given there was no primary breast lesion.  Patient completed approximately 4 years of treatment with letrozole, but this was discontinued secondary to enrolling in clinical trial for her newly diagnosed colon cancer.  Her most recent mammogram on July 04, 2019 was reported as BI-RADS 1.  Repeat in December 2021.  3.  Bone health: Patient's most recent bone mineral density on February 03, 2018 reported T score of -0.7 which is unchanged from 3 years prior.  Consider repeat bone mineral density in July 2021.   4.  Lynch syndrome: Genetic testing confirmed patient has Lynch syndrome.  She reports her sister and son both have tested positive.  She plans to talk to the rest of her children about genetic testing.  Appreciate genetics input.  5.  Endometrial cancer: Patient has had a total hysterectomy.  6.  Back pain/sciatica: Worse today.  Continue monthly follow-up with pain clinic as scheduled.  Patient reports  insurance will only allow 3 injections every 6 months, therefore she cannot receive additional treatment until June 2021.  7.  Peripheral neuropathy: Grade 3.  Improved in her hands and right foot.  The residual neuropathy on her left lower extremity is likely related to her problems with sciatica. Continue gabapentin 900 mg 3 times daily.  If she sees no improvements, can consider referral to neurology.  8.  Anemia: Patient's hemoglobin has trended down slightly to 10.9, monitor. 9.  Renal insufficiency: Essentially resolved. 10.  Hypokalemia: Resolved.  Continue oral potassium supplementation.  11.  Diarrhea: Resolved.  Continue Imodium and Lomotil as needed. 12.  Fatigue: Chronic and unchanged.   13.  Hypotension: Resolved.  Continue Norvasc 5 mg daily. 14.  Port associated clot: Patient has now completed greater than 3 months of Eliquis and has discontinued treatment.  Patient expressed understanding and was in agreement with this plan. She also understands that She can call clinic at any time with any questions, concerns, or complaints.   Breast cancer metastasized to axillary lymph node   Staging form: Breast, AJCC 7th Edition     Clinical stage from 10/22/2014: Stage Unknown (TX, N1, M0) - Signed by [name], MD on 10/22/2014   [name], MD   12/04/2019 10:27 AM

## 2019-12-04 ENCOUNTER — Inpatient Hospital Stay: Payer: Medicare HMO

## 2019-12-04 ENCOUNTER — Encounter: Payer: Self-pay | Admitting: *Deleted

## 2019-12-04 ENCOUNTER — Inpatient Hospital Stay (HOSPITAL_BASED_OUTPATIENT_CLINIC_OR_DEPARTMENT_OTHER): Payer: Medicare HMO | Admitting: Oncology

## 2019-12-04 ENCOUNTER — Other Ambulatory Visit: Payer: Self-pay

## 2019-12-04 ENCOUNTER — Encounter: Payer: Self-pay | Admitting: Oncology

## 2019-12-04 VITALS — BP 135/91 | HR 62 | Temp 96.4°F | Resp 16

## 2019-12-04 VITALS — BP 119/91 | HR 70 | Temp 97.8°F | Wt 198.2 lb

## 2019-12-04 DIAGNOSIS — C189 Malignant neoplasm of colon, unspecified: Secondary | ICD-10-CM | POA: Diagnosis not present

## 2019-12-04 DIAGNOSIS — D649 Anemia, unspecified: Secondary | ICD-10-CM | POA: Diagnosis not present

## 2019-12-04 DIAGNOSIS — N289 Disorder of kidney and ureter, unspecified: Secondary | ICD-10-CM | POA: Diagnosis not present

## 2019-12-04 DIAGNOSIS — M5442 Lumbago with sciatica, left side: Secondary | ICD-10-CM

## 2019-12-04 DIAGNOSIS — C184 Malignant neoplasm of transverse colon: Secondary | ICD-10-CM

## 2019-12-04 DIAGNOSIS — Z90710 Acquired absence of both cervix and uterus: Secondary | ICD-10-CM

## 2019-12-04 DIAGNOSIS — Z79899 Other long term (current) drug therapy: Secondary | ICD-10-CM

## 2019-12-04 DIAGNOSIS — R5383 Other fatigue: Secondary | ICD-10-CM | POA: Diagnosis not present

## 2019-12-04 DIAGNOSIS — G620 Drug-induced polyneuropathy: Secondary | ICD-10-CM | POA: Diagnosis not present

## 2019-12-04 DIAGNOSIS — Z006 Encounter for examination for normal comparison and control in clinical research program: Secondary | ICD-10-CM

## 2019-12-04 DIAGNOSIS — Z853 Personal history of malignant neoplasm of breast: Secondary | ICD-10-CM

## 2019-12-04 DIAGNOSIS — Z87891 Personal history of nicotine dependence: Secondary | ICD-10-CM

## 2019-12-04 DIAGNOSIS — Z1509 Genetic susceptibility to other malignant neoplasm: Secondary | ICD-10-CM

## 2019-12-04 DIAGNOSIS — Z95828 Presence of other vascular implants and grafts: Secondary | ICD-10-CM

## 2019-12-04 DIAGNOSIS — G629 Polyneuropathy, unspecified: Secondary | ICD-10-CM | POA: Diagnosis not present

## 2019-12-04 DIAGNOSIS — R197 Diarrhea, unspecified: Secondary | ICD-10-CM | POA: Diagnosis not present

## 2019-12-04 LAB — COMPREHENSIVE METABOLIC PANEL
ALT: 17 U/L (ref 0–44)
AST: 18 U/L (ref 15–41)
Albumin: 3.8 g/dL (ref 3.5–5.0)
Alkaline Phosphatase: 89 U/L (ref 38–126)
Anion gap: 8 (ref 5–15)
BUN: 12 mg/dL (ref 8–23)
CO2: 25 mmol/L (ref 22–32)
Calcium: 9.1 mg/dL (ref 8.9–10.3)
Chloride: 108 mmol/L (ref 98–111)
Creatinine, Ser: 1.07 mg/dL — ABNORMAL HIGH (ref 0.44–1.00)
GFR calc Af Amer: >60 mL/min (ref >60)
GFR calc non Af Amer: 53 mL/min — ABNORMAL LOW (ref >60)
Glucose, Bld: 92 mg/dL (ref 70–99)
Potassium: 3.7 mmol/L (ref 3.5–5.1)
Sodium: 141 mmol/L (ref 135–145)
Total Bilirubin: 0.5 mg/dL (ref 0.3–1.2)
Total Protein: 7 g/dL (ref 6.5–8.1)

## 2019-12-04 LAB — URINALYSIS, COMPLETE (UACMP) WITH MICROSCOPIC
Bilirubin Urine: NEGATIVE
Glucose, UA: NEGATIVE mg/dL
Hgb urine dipstick: NEGATIVE
Ketones, ur: NEGATIVE mg/dL
Leukocytes,Ua: NEGATIVE
Nitrite: NEGATIVE
Protein, ur: NEGATIVE mg/dL
Specific Gravity, Urine: 1.024 (ref 1.005–1.030)
pH: 5 (ref 5.0–8.0)

## 2019-12-04 LAB — CBC WITH DIFFERENTIAL/PLATELET
Abs Immature Granulocytes: 0.03 10*3/uL (ref 0.00–0.07)
Basophils Absolute: 0.1 10*3/uL (ref 0.0–0.1)
Basophils Relative: 1 %
Eosinophils Absolute: 0.1 10*3/uL (ref 0.0–0.5)
Eosinophils Relative: 2 %
HCT: 30.8 % — ABNORMAL LOW (ref 36.0–46.0)
Hemoglobin: 10.9 g/dL — ABNORMAL LOW (ref 12.0–15.0)
Immature Granulocytes: 0 %
Lymphocytes Relative: 24 %
Lymphs Abs: 1.8 10*3/uL (ref 0.7–4.0)
MCH: 28.8 pg (ref 26.0–34.0)
MCHC: 35.4 g/dL (ref 30.0–36.0)
MCV: 81.3 fL (ref 80.0–100.0)
Monocytes Absolute: 0.5 10*3/uL (ref 0.1–1.0)
Monocytes Relative: 7 %
Neutro Abs: 4.9 10*3/uL (ref 1.7–7.7)
Neutrophils Relative %: 66 %
Platelets: 203 10*3/uL (ref 150–400)
RBC: 3.79 MIL/uL — ABNORMAL LOW (ref 3.87–5.11)
RDW: 14.9 % (ref 11.5–15.5)
WBC: 7.3 10*3/uL (ref 4.0–10.5)
nRBC: 0 % (ref 0.0–0.2)

## 2019-12-04 MED ORDER — SODIUM CHLORIDE 0.9 % IV SOLN
840.0000 mg | Freq: Once | INTRAVENOUS | Status: AC
  Administered 2019-12-04: 840 mg via INTRAVENOUS
  Filled 2019-12-04: qty 14

## 2019-12-04 MED ORDER — SODIUM CHLORIDE 0.9 % IV SOLN
Freq: Once | INTRAVENOUS | Status: AC
  Filled 2019-12-04: qty 250

## 2019-12-04 MED ORDER — SODIUM CHLORIDE 0.9% FLUSH
10.0000 mL | INTRAVENOUS | Status: DC | PRN
  Administered 2019-12-04: 10 mL via INTRAVENOUS
  Filled 2019-12-04: qty 10

## 2019-12-04 MED ORDER — HEPARIN SOD (PORK) LOCK FLUSH 100 UNIT/ML IV SOLN
500.0000 [IU] | Freq: Once | INTRAVENOUS | Status: AC | PRN
  Administered 2019-12-04: 500 [IU]
  Filled 2019-12-04: qty 5

## 2019-12-04 NOTE — Research (Signed)
Patient Carrie Alvarez presents to clinic this morning for consideration of her C21D1 Huey Bienenstock only treatment on the [id number] [name] study. Patient reports her diarrhea stopped shortly after starting the Lomotil following her last infusion and states it it returns she will begin taking Lomotil immediately. Also continues to reports her chronic back pain is intense at times, but states she does not want surgery and just gets a bad feeling about it. States she is not eligible to have another injection in her back until June since she has already had 3 of them within the past 6 months now. She is seen monthly in the pain management clinic for her back pain and pain medication management. States her hands are almost back to normal and the color is returning, but states they still feel tender at times and she continues to experience some mild tingling and numbness bilaterally. Reports numbness and tingling continues in her feet bilaterally - more so than her hands and even more on the left than the right foot. States she still feels something in the ball of her left foot, and wonders if this will ever resolve. Patient still reporting significant fatigue at times which is not really relieved by rest. She is using the wheelchair while in clinic as usual, but continues to report she is able to ambulate independently using her cane at home, and manages all self-care needs without assistance. [name] remains afebrile and SpO2 was measured at 99% at rest, on room air and while while wearing a mask. Her appetite remains poor to "non-existant", but patient reports she tries to eat regularly. Her weight is within usual range. Patient does continue to report infrequent abdominal pain, which remains mild and around her incision site, but not constant. Solicited adverse events were assessed by this RN today, and she completed the PRO-CTCAE booklet in clinic prior to infusion. Local labs were collected via port-a-cath  this morning and CBC and chemistry values were reviewed by Dr. Grayland Ormond. Potassium remains within normal limits this morning at 3.7 mmol/L and patient states she is taking her potassium supplement daily. Serum creatinine level is a little elevated again today at 1.07 mg/dL, and CrCl calculation is 70 mL/min. Hgb is low again at 10.9 g/dL this morning. Other labs are WNL for patient to receive her treatment today. H&P completed by Dr. Grayland Ormond and patient will proceed with Cycle 21 Atezolizumab as scheduled today. Solicited and other Adverse events with grade and attribution are as noted below:   Adverse Event Log  Study/Protocol: [id number] [name] Cycle: Cycle 20 Event Grade Onset Date Resolved Date Drug Name Attribution Treatment Comments  Diarrhea 1 11/10/2019 08/25/2019 05/18/2019 01/10/2019 11/22/2019 09/08/2019 07/14/2019 05/02/2019 n/a surgery MD prescribed Lomotil now  Reports Lomotil worked well for her  Abdominal pain 2 04/08/2019 01/10/2019 04/12/2019 04/08/2019 n/a surgery Oxycodone Reports pain resolved  Constipation 0      Denies  Nausea 1 10/09/2019 06/07/2019 10/10/2019 06/14/2019  unrelated    Fatigue 2 06/12/2018 approx   possible  Reports no energy in past 12 months  Anorexia 1 07/13/2008   unrelated  States this is her norm for several years  Cough 0        Dyspnea 0        Fever 1 06/07/2019 06/07/2019      UTI 0        Palmer-plantar erythrodesia 1 03/13/2019   probable otc cream Reports this is almost resolved  Total biliruben 0  Neutrophils 0        TSH 0      No history of hypothyroidism  Sensory Peripheral Neuropathy 3 06/13/2019 07/10/2019  Oxaliplatin Oxaliplatin discontinued   Sensory Peripheral Neuropathy 2 07/11/2019 11/19/2014  06/13/2019  Paclitaxel Plus Oxaliplatin Gabapentin - dose increased  Numbness & Tingling in fingertips, toes & feet bil. since taking Taxol in 2014  Hypokalemia 1  10/23/2019 09/25/2019 08/14/2019 07/17/2019 03/27/2019 02/27/2019 11/06/2019 10/09/2019 08/28/2019 07/31/2019 04/17/2019 03/13/2019  probable none   Anemia 1 12/04/2019 08/28/2019 05/01/2019  11/20/2019 06/26/2019  possible  Hgb 10.9 today  Anemia 2 06/26/2019 02/27/2019 08/28/2019 04/17/2019  probable none   Elevated creatinine 1 11/20/2019 08/28/2019 02/07/2019  10/09/2019 08/14/2019  unrelated  1.07 mg/dL today  Platelet count   decreased 1 05/29/2019 03/27/2019 07/17/2019 04/17/2019  definitely none   Possible Colitis 2 04/08/2019 04/12/2019  probable Steroids Resolved  Weight Loss 1 04/10/2019 04/17/2019  probable none   Proteinuria 1 05/15/2019 12/04/2019   none Resolved  Abdominal pain 1 10/23/2019 05/22/2019  10/09/2019  unlikely  Still Reports infrequent mild pain   Hypokalemia 3 11/06/2019 05/29/2019 11/20/2019 06/26/2019  probable oral K+ K+ is 3.7 mmol/L today  vomiting 1 06/06/2019 06/07/2019  unlikely    somnolence 2 06/05/2019 06/08/2019  unlikely    headache 2 06/05/2019 06/07/2019  unlikely    Port associated  thrombosis 3 07/10/2019 10/23/2019  unlikely Eliquis Pt stopped taking Eliquis on 10/16/2019  Hypotension 2 06/12/2019 07/17/2019  possible IV fluids   Chronic back & left leg pain 2 07/13/2017   unrelated  Seen monthly at pain management clinic  [facility], BSN, MHA, OCN 12/04/2019 10:23 AM

## 2019-12-05 LAB — CEA: CEA: 4.6 ng/mL (ref 0.0–4.7)

## 2019-12-14 DIAGNOSIS — G894 Chronic pain syndrome: Secondary | ICD-10-CM | POA: Diagnosis not present

## 2019-12-14 DIAGNOSIS — M545 Low back pain: Secondary | ICD-10-CM | POA: Diagnosis not present

## 2019-12-14 DIAGNOSIS — M542 Cervicalgia: Secondary | ICD-10-CM | POA: Diagnosis not present

## 2019-12-15 ENCOUNTER — Encounter: Payer: Self-pay | Admitting: Oncology

## 2019-12-15 NOTE — Progress Notes (Signed)
Patient called for pre assessment. Denies any pain or concerns at this time.  °

## 2019-12-15 NOTE — Progress Notes (Signed)
[place]  [phone] [phone]  ID: Norm Salt OB: 1951/06/07  MR#: [id number]  SWN#:[id number]  Patient Care Team: Paulene Floor as PCP - General (Physician Assistant) [name], MD as Consulting Physician (Oncology) [name], MD as Attending Physician (Pain Medicine) [name]., MD as Consulting Physician (Ophthalmology) [name], RN as Oncology Nurse Navigator Normajean Glasgow, MD as Attending Physician (Physical Medicine and Rehabilitation)  CHIEF COMPLAINT: Stage IIa ER+ left breast cancer with no breast lesion and axillary lymph node metastasis.  Now with stage IIIa colon cancer.  INTERVAL HISTORY: Patient returns to clinic today for further evaluation and continuation of maintenance Tecentriq on clinical trial.  She currently feels well.  Her diarrhea has resolved.  Her peripheral neuropathy, back and left leg pain are all chronic and unchanged.  She is not in a wheelchair today.  She continues to have chronic weakness and fatigue. She denies any recent fevers or illnesses.  She has no chest pain, shortness of breath, cough, or hemoptysis.  She denies any nausea, vomiting, constipation, or diarrhea. She has no melena or hematochezia.  She has no urinary complaints.  Patient offers no further specific complaints today.  REVIEW OF SYSTEMS:   Review of Systems  Constitutional: Positive for malaise/fatigue. Negative for fever and weight loss.  Respiratory: Negative.  Negative for cough and shortness of breath.   Cardiovascular: Negative.  Negative for chest pain and leg swelling.  Gastrointestinal: Negative.  Negative for abdominal pain, blood in stool, constipation, diarrhea, melena, nausea and vomiting.  Genitourinary: Negative.  Negative for dysuria.  Musculoskeletal: Positive for back pain. Negative for neck pain.  Skin: Negative.  Negative for rash.  Neurological: Positive for tingling,  sensory change and weakness. Negative for dizziness, focal weakness and headaches.  Psychiatric/Behavioral: Negative.  Negative for depression. The patient is not nervous/anxious.     As per HPI. Otherwise, a complete review of systems is negative.  PAST MEDICAL HISTORY: Past Medical History:  Diagnosis Date  . Allergy   . Anemia   . Back pain   . Breast cancer ([place]) 2015   LT LUMPECTOMY 12-2014 FOLLOWING CHEMO  . Carpal tunnel syndrome    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  . Cataract   . Colon cancer ([place]) 01/2019  . Depression   . Family history of breast cancer   . Family history of colon cancer   . GERD (gastroesophageal reflux disease)    problem with reflux during chemo  . History of methicillin resistant staphylococcus aureus (MRSA)   . Hypercholesteremia   . Hypertension   . Lumbosacral pain    sees pain management in [place]  . Obesity   . Personal history of chemotherapy 2016   left breast ca  . Personal history of chemotherapy 2020   colon ca  . Pinched nerve    left side, lumbar area  . Uterine cancer ([place]) 1994    PAST SURGICAL HISTORY: Past Surgical History:  Procedure Laterality Date  . ABDOMINAL HYSTERECTOMY  1994   UTERINE CA  . AXILLARY LYMPH NODE BIOPSY Left 12/18/2014   Procedure: AXILLARY LYMPH NODE BIOPSY/;  Surgeon: Robert Bellow, MD;  Location: [facility];  Service: General;  Laterality: Left;  . AXILLARY LYMPH NODE DISSECTION Left 12/18/2014   Procedure: AXILLARY LYMPH NODE DISSECTION;  Surgeon: Robert Bellow, MD;  Location: [facility];  Service: General;  Laterality: Left;  . BREAST BIOPSY Left 05/2014  CORE BX OF LN, METASTATIC ADENOCARCINOMA  . BREAST LUMPECTOMY Left 12/2014   CHEMO FIRST THEN SURGERY OF LN  . BREAST SURGERY     lymph node removal  . CHOLECYSTECTOMY N/A 04/19/2016   Procedure: LAPAROSCOPIC CHOLECYSTECTOMY WITH INTRAOPERATIVE CHOLANGIOGRAM;  Surgeon: Hubbard Robinson, MD;  Location: [facility];  Service: General;   Laterality: N/A;  . COLON RESECTION Right 01/10/2019   Procedure: HAND ASSISTED LAPAROSCOPIC RIGHT COLON RESECTION;  Surgeon: Jules Husbands, MD;  Location: [facility];  Service: General;  Laterality: Right;  . COLONOSCOPY WITH PROPOFOL N/A 12/30/2018   Procedure: COLONOSCOPY WITH PROPOFOL;  Surgeon: Lucilla Lame, MD;  Location: [facility];  Service: Endoscopy;  Laterality: N/A;  . medial branch block  11/06/2015   lumbar facet Dr. Primus Bravo  . POLYPECTOMY N/A 12/30/2018   Procedure: POLYPECTOMY;  Surgeon: Lucilla Lame, MD;  Location: [facility];  Service: Endoscopy;  Laterality: N/A;  Clips placed at Hepatic Flexure Polyp (2) and Transverse Colon Polyp (4) removal sites  . PORTACATH PLACEMENT Right 02/02/2019   Procedure: INSERTION PORT-A-CATH;  Surgeon: Jules Husbands, MD;  Location: [facility];  Service: General;  Laterality: Right;  . SENTINEL NODE BIOPSY Left 12/18/2014   Procedure: SENTINEL NODE BIOPSY;  Surgeon: Robert Bellow, MD;  Location: [facility];  Service: General;  Laterality: Left;    FAMILY HISTORY Family History  Problem Relation Age of Onset  . Breast cancer Sister 40  . Colon cancer Sister   . Colon cancer Mother        dx 1s  . Hypertension Mother   . Asthma Mother   . Cancer Father        voice box removed  . Cancer Maternal Grandmother        unsure type  . Colon cancer Cousin   . Cancer Cousin        unsure type       ADVANCED DIRECTIVES:    HEALTH MAINTENANCE: Social History   Tobacco Use  . Smoking status: Former Smoker    Packs/day: 0.50    Types: Cigarettes    Quit date: 07/18/2014    Years since quitting: 5.4  . Smokeless tobacco: Never Used  Substance Use Topics  . Alcohol use: No    Alcohol/week: 0.0 standard drinks  . Drug use: No    No Known Allergies  Current Outpatient Medications  Medication Sig Dispense Refill  . acetaminophen (TYLENOL) 500 MG tablet Take 500 mg by mouth every 6 (six) hours as needed for mild pain or  moderate pain.     Marland Kitchen albuterol (PROVENTIL HFA;VENTOLIN HFA) 108 (90 Base) MCG/ACT inhaler Inhale 2 puffs into the lungs every 6 (six) hours as needed for wheezing or shortness of breath. 1 Inhaler 2  . amLODipine (NORVASC) 5 MG tablet Take 1 tablet (5 mg total) by mouth daily. MD re-ordered at decreased dose 30 tablet 5  . atorvastatin (LIPITOR) 10 MG tablet TAKE 1 TABLET BY MOUTH IN THE MORNING 90 tablet 0  . baclofen (LIORESAL) 10 MG tablet Take 1 tablet (10 mg total) by mouth 2 (two) times daily. 30 each 0  . cholecalciferol (VITAMIN D) 1000 units tablet Take 1,000 Units by mouth daily.    . diphenoxylate-atropine (LOMOTIL) 2.5-0.025 MG tablet Take 1 tablet by mouth 4 (four) times daily as needed for diarrhea or loose stools. 30 tablet 2  . fluticasone (FLONASE) 50 MCG/ACT nasal spray Use 2 spray(s) in each nostril once daily 16 g  0  . gabapentin (NEURONTIN) 300 MG capsule Take 300-600 mg by mouth 4 (four) times daily as needed (neuropathic pain).     Marland Kitchen letrozole (FEMARA) 2.5 MG tablet Take 2.5 mg by mouth daily.    Marland Kitchen lidocaine-prilocaine (EMLA) cream Apply to affected area once 30 g 3  . NARCAN 4 MG/0.1ML LIQD nasal spray kit Place 1 spray into the nose.    Marland Kitchen Oxycodone HCl 10 MG TABS LIMIT ONE HALF TO ONE TABLET BY MOUTH 3 TO 5 TIMES DAILY IF TOLERATED    . potassium chloride SA (KLOR-CON) 20 MEQ tablet Take 1 tablet (20 mEq total) by mouth 2 (two) times daily. 60 tablet 1  . pyridoxine (B-6) 100 MG tablet Take 100 mg by mouth daily.    . sertraline (ZOLOFT) 100 MG tablet Take 2 tablets by mouth once daily 180 tablet 0  . vitamin B-12 (CYANOCOBALAMIN) 500 MCG tablet Take 500 mcg by mouth daily.     No current facility-administered medications for this visit.   Facility-Administered Medications Ordered in Other Visits  Medication Dose Route Frequency Provider Last Rate Last Admin  . 0.9 %  sodium chloride infusion   Intravenous Once [name], MD      . heparin lock flush 100  unit/mL  500 Units Intravenous Once [name], MD      . heparin lock flush 100 unit/mL  500 Units Intravenous Once [name], MD      . heparin lock flush 100 unit/mL  500 Units Intravenous Once [name], MD      . INV-atezolizumab [facility] [phone] (TECENTRIQ) 840 mg in sodium chloride 0.9 % 250 mL chemo infusion  840 mg Intravenous Once [name], MD      . sodium chloride flush (NS) 0.9 % injection 10 mL  10 mL Intravenous PRN [name], MD   10 mL at 02/27/19 0925    OBJECTIVE: Vitals:   12/18/19 0931  BP: (!) 123/95  Pulse: 81  Resp: 20  Temp: (!) 96.4 F (35.8 C)  SpO2: 100%     Body mass index is 33.89 kg/m.    ECOG FS:1 - Symptomatic but completely ambulatory  General: Well-developed, well-nourished, no acute distress. Eyes: Pink conjunctiva, anicteric sclera. HEENT: Normocephalic, moist mucous membranes. Lungs: No audible wheezing or coughing. Heart: Regular rate and rhythm. Abdomen: Soft, nontender, no obvious distention. Musculoskeletal: No edema, cyanosis, or clubbing. Neuro: Alert, answering all questions appropriately. Cranial nerves grossly intact. Skin: No rashes or petechiae noted. Psych: Normal affect.   LAB RESULTS:  Lab Results  Component Value Date   NA 142 12/18/2019   K 3.9 12/18/2019   CL 108 12/18/2019   CO2 26 12/18/2019   GLUCOSE 94 12/18/2019   BUN 15 12/18/2019   CREATININE 1.07 (H) 12/18/2019   CALCIUM 9.7 12/18/2019   PROT 7.5 12/18/2019   ALBUMIN 4.1 12/18/2019   AST 18 12/18/2019   ALT 17 12/18/2019   ALKPHOS 87 12/18/2019   BILITOT 0.9 12/18/2019   GFRNONAA 53 (L) 12/18/2019   GFRAA >60 12/18/2019    Lab Results  Component Value Date   WBC 7.8 12/18/2019   NEUTROABS 5.7 12/18/2019   HGB 12.1 12/18/2019   HCT 34.3 (L) 12/18/2019   MCV 81.5 12/18/2019   PLT 246 12/18/2019     STUDIES: No results found.  ASSESSMENT: Stage IIa ER+ left breast cancer with no breast lesion  and axillary lymph node metastasis. (TxN1M0)  HER-2  negative.  Now with stage IIIa colon cancer.  PLAN:   1.  Stage IIIa colon cancer: Pathology report reviewed independently.  Patient's most recent imaging on August 07, 2019 with CT scans revealed no evidence of recurrent or progressive disease.  Previously, patient agreed to enroll in clinical trial.  Oxaliplatin was discontinued early secondary to worsening neuropathy.  Patient completed 12 cycles of chemotherapy on July 31, 2019.  Continue with maintenance Tecentriq for an additional 6 months, tentatively completing treatment on January 29, 2020.  Proceed with treatment today.  Return to clinic in 2 weeks for further evaluation and continuation of Tecentriq.    2. Stage IIa ER+ left breast cancer with no breast lesion and axillary lymph node metastasis: Despite no obvious breast lesion on mammogram or breast MRI, pathology and pattern of spread was consistent with primary breast cancer. CT and bone scan at time of diagnosis revealed no other evidence of malignancy.  She only received 3 cycles of Adriamycin and Cytoxan. Taxol was discontinued early as well secondary to persistent peripheral neuropathy. Patient completed her chemotherapy on Nov 19, 2014.  She elected not to pursue axillary node dissection given the potential morbidity of this procedure. It was also elected not to pursue adjuvant XRT given there was no primary breast lesion.  Patient completed approximately 4 years of treatment with letrozole, but this was discontinued secondary to enrolling in clinical trial for her newly diagnosed colon cancer.  Her most recent mammogram on July 04, 2019 was reported as BI-RADS 1.  Repeat in December 2021.  3.  Bone health: Patient's most recent bone mineral density on February 03, 2018 reported T score of -0.7 which is unchanged from 3 years prior.  Consider repeat bone mineral density in July 2021.   4.  Lynch syndrome: Genetic testing confirmed  patient has Lynch syndrome.  She reports her sister and son both have tested positive.  She plans to talk to the rest of her children about genetic testing.  Appreciate genetics input.  5.  Endometrial cancer: Patient has had a total hysterectomy.  6.  Back pain/sciatica: Chronic and unchanged.  Continue monthly follow-up with pain clinic as scheduled.  Patient reports insurance will only allow 3 injections every 6 months, therefore she cannot receive additional treatment until June 2021.  7.  Peripheral neuropathy: Grade 3.  Improved in her hands and right foot.  The residual neuropathy on her left lower extremity is likely related to her problems with sciatica. Continue gabapentin 900 mg 3 times daily.  If she sees no improvements, can consider referral to neurology.  8.  Anemia: Resolved.  Patient's hemoglobin is 12.1 today. 9.  Renal insufficiency: Essentially resolved. 10.  Hypokalemia: Resolved.  Continue oral potassium supplementation.  11.  Diarrhea: Resolved.  Continue Imodium and Lomotil as needed. 12.  Fatigue: Chronic and unchanged.   13.  Hypotension: Resolved.  Continue Norvasc 5 mg daily. 14.  Port associated clot: Patient has now completed greater than 3 months of Eliquis and has discontinued treatment.  Patient has expressed interest in removing her port once treatment is completed.  Patient expressed understanding and was in agreement with this plan. She also understands that She can call clinic at any time with any questions, concerns, or complaints.   Breast cancer metastasized to axillary lymph node   Staging form: Breast, AJCC 7th Edition     Clinical stage from 10/22/2014: Stage Unknown (TX, N1, M0) - Signed by [name], MD on 10/22/2014  [name], MD   12/18/2019 10:12 AM

## 2019-12-18 ENCOUNTER — Encounter: Payer: Self-pay | Admitting: Oncology

## 2019-12-18 ENCOUNTER — Inpatient Hospital Stay: Payer: Medicare HMO

## 2019-12-18 ENCOUNTER — Encounter: Payer: Self-pay | Admitting: *Deleted

## 2019-12-18 ENCOUNTER — Inpatient Hospital Stay: Payer: Medicare HMO | Attending: Oncology

## 2019-12-18 ENCOUNTER — Other Ambulatory Visit: Payer: Self-pay

## 2019-12-18 ENCOUNTER — Inpatient Hospital Stay (HOSPITAL_BASED_OUTPATIENT_CLINIC_OR_DEPARTMENT_OTHER): Payer: Medicare HMO | Admitting: Oncology

## 2019-12-18 VITALS — BP 123/95 | HR 81 | Temp 96.4°F | Resp 20 | Wt 191.3 lb

## 2019-12-18 DIAGNOSIS — M5432 Sciatica, left side: Secondary | ICD-10-CM | POA: Insufficient documentation

## 2019-12-18 DIAGNOSIS — G629 Polyneuropathy, unspecified: Secondary | ICD-10-CM | POA: Insufficient documentation

## 2019-12-18 DIAGNOSIS — Z5112 Encounter for antineoplastic immunotherapy: Secondary | ICD-10-CM | POA: Insufficient documentation

## 2019-12-18 DIAGNOSIS — C184 Malignant neoplasm of transverse colon: Secondary | ICD-10-CM

## 2019-12-18 DIAGNOSIS — Z006 Encounter for examination for normal comparison and control in clinical research program: Secondary | ICD-10-CM | POA: Diagnosis present

## 2019-12-18 DIAGNOSIS — C189 Malignant neoplasm of colon, unspecified: Secondary | ICD-10-CM | POA: Insufficient documentation

## 2019-12-18 DIAGNOSIS — Z8542 Personal history of malignant neoplasm of other parts of uterus: Secondary | ICD-10-CM | POA: Insufficient documentation

## 2019-12-18 DIAGNOSIS — Z90710 Acquired absence of both cervix and uterus: Secondary | ICD-10-CM | POA: Insufficient documentation

## 2019-12-18 DIAGNOSIS — Z1509 Genetic susceptibility to other malignant neoplasm: Secondary | ICD-10-CM | POA: Insufficient documentation

## 2019-12-18 DIAGNOSIS — Z86718 Personal history of other venous thrombosis and embolism: Secondary | ICD-10-CM | POA: Insufficient documentation

## 2019-12-18 DIAGNOSIS — Z79899 Other long term (current) drug therapy: Secondary | ICD-10-CM | POA: Insufficient documentation

## 2019-12-18 DIAGNOSIS — M549 Dorsalgia, unspecified: Secondary | ICD-10-CM | POA: Insufficient documentation

## 2019-12-18 DIAGNOSIS — R5383 Other fatigue: Secondary | ICD-10-CM | POA: Insufficient documentation

## 2019-12-18 DIAGNOSIS — Z87891 Personal history of nicotine dependence: Secondary | ICD-10-CM | POA: Insufficient documentation

## 2019-12-18 DIAGNOSIS — Z853 Personal history of malignant neoplasm of breast: Secondary | ICD-10-CM | POA: Insufficient documentation

## 2019-12-18 LAB — CBC WITH DIFFERENTIAL/PLATELET
Abs Immature Granulocytes: 0.03 10*3/uL (ref 0.00–0.07)
Basophils Absolute: 0 10*3/uL (ref 0.0–0.1)
Basophils Relative: 1 %
Eosinophils Absolute: 0.1 10*3/uL (ref 0.0–0.5)
Eosinophils Relative: 1 %
HCT: 34.3 % — ABNORMAL LOW (ref 36.0–46.0)
Hemoglobin: 12.1 g/dL (ref 12.0–15.0)
Immature Granulocytes: 0 %
Lymphocytes Relative: 19 %
Lymphs Abs: 1.5 10*3/uL (ref 0.7–4.0)
MCH: 28.7 pg (ref 26.0–34.0)
MCHC: 35.3 g/dL (ref 30.0–36.0)
MCV: 81.5 fL (ref 80.0–100.0)
Monocytes Absolute: 0.5 10*3/uL (ref 0.1–1.0)
Monocytes Relative: 6 %
Neutro Abs: 5.7 10*3/uL (ref 1.7–7.7)
Neutrophils Relative %: 73 %
Platelets: 246 10*3/uL (ref 150–400)
RBC: 4.21 MIL/uL (ref 3.87–5.11)
RDW: 14.6 % (ref 11.5–15.5)
WBC: 7.8 10*3/uL (ref 4.0–10.5)
nRBC: 0 % (ref 0.0–0.2)

## 2019-12-18 LAB — COMPREHENSIVE METABOLIC PANEL
ALT: 17 U/L (ref 0–44)
AST: 18 U/L (ref 15–41)
Albumin: 4.1 g/dL (ref 3.5–5.0)
Alkaline Phosphatase: 87 U/L (ref 38–126)
Anion gap: 8 (ref 5–15)
BUN: 15 mg/dL (ref 8–23)
CO2: 26 mmol/L (ref 22–32)
Calcium: 9.7 mg/dL (ref 8.9–10.3)
Chloride: 108 mmol/L (ref 98–111)
Creatinine, Ser: 1.07 mg/dL — ABNORMAL HIGH (ref 0.44–1.00)
GFR calc Af Amer: >60 mL/min (ref >60)
GFR calc non Af Amer: 53 mL/min — ABNORMAL LOW (ref >60)
Glucose, Bld: 94 mg/dL (ref 70–99)
Potassium: 3.9 mmol/L (ref 3.5–5.1)
Sodium: 142 mmol/L (ref 135–145)
Total Bilirubin: 0.9 mg/dL (ref 0.3–1.2)
Total Protein: 7.5 g/dL (ref 6.5–8.1)

## 2019-12-18 MED ORDER — SODIUM CHLORIDE 0.9% FLUSH
10.0000 mL | Freq: Once | INTRAVENOUS | Status: AC
  Administered 2019-12-18: 10 mL via INTRAVENOUS
  Filled 2019-12-18: qty 10

## 2019-12-18 MED ORDER — HEPARIN SOD (PORK) LOCK FLUSH 100 UNIT/ML IV SOLN
500.0000 [IU] | Freq: Once | INTRAVENOUS | Status: AC
  Administered 2019-12-18: 500 [IU] via INTRAVENOUS
  Filled 2019-12-18: qty 5

## 2019-12-18 MED ORDER — SODIUM CHLORIDE 0.9 % IV SOLN
Freq: Once | INTRAVENOUS | Status: AC
  Filled 2019-12-18: qty 250

## 2019-12-18 MED ORDER — HEPARIN SOD (PORK) LOCK FLUSH 100 UNIT/ML IV SOLN
INTRAVENOUS | Status: AC
  Filled 2019-12-18: qty 5

## 2019-12-18 MED ORDER — SODIUM CHLORIDE 0.9 % IV SOLN
840.0000 mg | Freq: Once | INTRAVENOUS | Status: AC
  Administered 2019-12-18: 840 mg via INTRAVENOUS
  Filled 2019-12-18: qty 14

## 2019-12-18 NOTE — Research (Addendum)
Patient Carrie Alvarez presents to clinic this morning for consideration of her C22D1 Huey Bienenstock only treatment on the [id number] [name] study. Patient reports she is only having 1-2 bowel movements per day and denies any further diarrhea. She continues to reports her chronic back pain is intense at times, but states she is a little better today and is using her cane to maneuver around the [place] and trying to avoid the wheelchair if possible. She continues to be seen monthly in the pain management clinic for her back pain and pain medication management. States her hands are better and almost back to normal with much improved color is returning, but states they still feel tender at times and she continues to experience some mild tingling and numbness in fingertips bilaterally. Reports numbness and tingling continues in her feet bilaterally - more so than her hands and even more on the left than the right foot.  Patient still reporting significant fatigue at times which is mostly not really relieved by rest, and reports she wakes in the morning feeling just as fatigued as she did when she went to bed. She is not using the wheelchair while in clinic today, and demonstrates she is able to ambulate independently using her cane. She also reports managing all of her self-care needs without assistance. Ms. Sabas remains afebrile and SpO2 was measured at 100% at rest, on room air and while while wearing a mask. Her appetite remains poor to "non-existant", but patient reports she tries to eat regularly. Her weight is within usual range. Patient does continue to report infrequent abdominal pain, which remains mild and to the right her incision site, but not constant. Solicited adverse events were assessed by this RN today, and patient completed the PRO-CTCAE booklet in clinic prior to infusion. Local labs were collected via port-a-cath this morning and CBC and chemistry values were reviewed by Dr. Grayland Ormond.  Potassium remains within normal limits this morning at 3.9 mmol/L and patient states she is taking her potassium supplement daily. Serum creatinine remains elevated today at 1.07 mg/dL, and CrCl calculation is 68 mL/min. Hgb is back within normal range at 12.1 g/dL this morning. Other labs are also WNL for patient to receive her treatment today. H&P completed by Dr. Grayland Ormond and patient will proceed with Cycle 22 Atezolizumab as scheduled today. Solicited and other Adverse events with grade and attribution are as noted below:   Adverse Event Log  Study/Protocol: [id number] [name] Cycle: Cycle 21 Event Grade Onset Date Resolved Date Drug Name Attribution Treatment Comments  Diarrhea 0      Reports Lomotil worked   Constipation 0      Denies  Nausea 0     unrelated    Fatigue 2 06/12/2018   possible  Reports no energy   Anorexia 1 07/13/2008   unrelated  States this is her norm   Cough 0        Dyspnea 0        Fever 0        UTI 0        Palmer-plantar erythrodesia 1 03/13/2019   probable otc cream Reports this is almost resolved  Total biliruben 0        Neutrophils 0        TSH 0        Sensory Peripheral Neuropathy 2 07/11/2019     unrelated Gabapentin - dose recently increased  Numbness & Tingling in fingertips, toes & feet bil. from prior Taxol &  Oxaliplatin  Anemia 1 12/04/2019  12/18/2019   possible  Hgb 12.1 today  Elevated creatinine 1 11/20/2019    unrelated  1.07 mg/dL today  Abdominal pain 1 10/23/2019     unlikely  Still Reports infrequent mild pain   Chronic back & left leg pain 2 07/13/2017   unrelated  Seen monthly at pain management clinic  [facility], BSN, MHA, OCN 12/18/2019 10:08 AM

## 2019-12-19 LAB — CEA: CEA: 4.1 ng/mL (ref 0.0–4.7)

## 2019-12-28 NOTE — Progress Notes (Signed)
[place]  [phone]) [phone] [phone]) [phone]  ID: Norm Salt OB: 04-Aug-1950  MR#: [id number]  KNL#:[id number]  Patient Care Team: Paulene Floor as PCP - General (Physician Assistant) [name], MD as Consulting Physician (Oncology) [name], MD as Attending Physician (Pain Medicine) [name]., MD as Consulting Physician (Ophthalmology) [name], RN as Oncology Nurse Navigator Normajean Glasgow, MD as Attending Physician (Physical Medicine and Rehabilitation)  CHIEF COMPLAINT: Stage IIa ER+ left breast cancer with no breast lesion and axillary lymph node metastasis.  Now with stage IIIa colon cancer.  INTERVAL HISTORY: Patient returns to clinic today for further evaluation and continuation of maintenance Tecentriq on clinical trial.  Her peripheral neuropathy is worse today particularly in her feet and left leg.  She continues to have chronic back pain.  Patient is in a wheelchair today.  She continues to have chronic weakness and fatigue. She denies any recent fevers or illnesses.  She has no chest pain, shortness of breath, cough, or hemoptysis.  She denies any nausea, vomiting, constipation, or diarrhea. She has no melena or hematochezia.  She has no urinary complaints.  Patient offers no further specific complaints today.  REVIEW OF SYSTEMS:   Review of Systems  Constitutional: Positive for malaise/fatigue. Negative for fever and weight loss.  Respiratory: Negative.  Negative for cough and shortness of breath.   Cardiovascular: Negative.  Negative for chest pain and leg swelling.  Gastrointestinal: Negative.  Negative for abdominal pain, blood in stool, constipation, diarrhea, melena, nausea and vomiting.  Genitourinary: Negative.  Negative for dysuria.  Musculoskeletal: Positive for back pain. Negative for neck pain.  Skin: Negative.  Negative for rash.  Neurological: Positive for tingling, sensory change and  weakness. Negative for dizziness, focal weakness and headaches.  Psychiatric/Behavioral: Negative.  Negative for depression. The patient is not nervous/anxious.     As per HPI. Otherwise, a complete review of systems is negative.  PAST MEDICAL HISTORY: Past Medical History:  Diagnosis Date  . Allergy   . Anemia   . Back pain   . Breast cancer ([place]) 2015   LT LUMPECTOMY 12-2014 FOLLOWING CHEMO  . Carpal tunnel syndrome    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  . Cataract   . Colon cancer ([place]) 01/2019  . Depression   . Family history of breast cancer   . Family history of colon cancer   . GERD (gastroesophageal reflux disease)    problem with reflux during chemo  . History of methicillin resistant staphylococcus aureus (MRSA)   . Hypercholesteremia   . Hypertension   . Lumbosacral pain    sees pain management in [place]  . Obesity   . Personal history of chemotherapy 2016   left breast ca  . Personal history of chemotherapy 2020   colon ca  . Pinched nerve    left side, lumbar area  . Uterine cancer ([place]) 1994    PAST SURGICAL HISTORY: Past Surgical History:  Procedure Laterality Date  . ABDOMINAL HYSTERECTOMY  1994   UTERINE CA  . AXILLARY LYMPH NODE BIOPSY Left 12/18/2014   Procedure: AXILLARY LYMPH NODE BIOPSY/;  Surgeon: Robert Bellow, MD;  Location: [facility];  Service: General;  Laterality: Left;  . AXILLARY LYMPH NODE DISSECTION Left 12/18/2014   Procedure: AXILLARY LYMPH NODE DISSECTION;  Surgeon: Robert Bellow, MD;  Location: [facility];  Service: General;  Laterality: Left;  . BREAST BIOPSY Left 05/2014   CORE BX OF  LN, METASTATIC ADENOCARCINOMA  . BREAST LUMPECTOMY Left 12/2014   CHEMO FIRST THEN SURGERY OF LN  . BREAST SURGERY     lymph node removal  . CHOLECYSTECTOMY N/A 04/19/2016   Procedure: LAPAROSCOPIC CHOLECYSTECTOMY WITH INTRAOPERATIVE CHOLANGIOGRAM;  Surgeon: Hubbard Robinson, MD;  Location: [facility];  Service: General;  Laterality: N/A;    . COLON RESECTION Right 01/10/2019   Procedure: HAND ASSISTED LAPAROSCOPIC RIGHT COLON RESECTION;  Surgeon: Jules Husbands, MD;  Location: [facility];  Service: General;  Laterality: Right;  . COLONOSCOPY WITH PROPOFOL N/A 12/30/2018   Procedure: COLONOSCOPY WITH PROPOFOL;  Surgeon: Lucilla Lame, MD;  Location: [place];  Service: Endoscopy;  Laterality: N/A;  . medial branch block  11/06/2015   lumbar facet Dr. Primus Bravo  . POLYPECTOMY N/A 12/30/2018   Procedure: POLYPECTOMY;  Surgeon: Lucilla Lame, MD;  Location: [place];  Service: Endoscopy;  Laterality: N/A;  Clips placed at Hepatic Flexure Polyp (2) and Transverse Colon Polyp (4) removal sites  . PORTACATH PLACEMENT Right 02/02/2019   Procedure: INSERTION PORT-A-CATH;  Surgeon: Jules Husbands, MD;  Location: [facility];  Service: General;  Laterality: Right;  . SENTINEL NODE BIOPSY Left 12/18/2014   Procedure: SENTINEL NODE BIOPSY;  Surgeon: Robert Bellow, MD;  Location: [facility];  Service: General;  Laterality: Left;    FAMILY HISTORY Family History  Problem Relation Age of Onset  . Breast cancer Sister 33  . Colon cancer Sister   . Colon cancer Mother        dx 77s  . Hypertension Mother   . Asthma Mother   . Cancer Father        voice box removed  . Cancer Maternal Grandmother        unsure type  . Colon cancer Cousin   . Cancer Cousin        unsure type       ADVANCED DIRECTIVES:    HEALTH MAINTENANCE: Social History   Tobacco Use  . Smoking status: Former Smoker    Packs/day: 0.50    Types: Cigarettes    Quit date: 07/18/2014    Years since quitting: 5.4  . Smokeless tobacco: Never Used  Vaping Use  . Vaping Use: Never used  Substance Use Topics  . Alcohol use: No    Alcohol/week: 0.0 standard drinks  . Drug use: No    No Known Allergies  Current Outpatient Medications  Medication Sig Dispense Refill  . acetaminophen (TYLENOL) 500 MG tablet Take 500 mg by mouth every 6 (six) hours as  needed for mild pain or moderate pain.     Marland Kitchen albuterol (PROVENTIL HFA;VENTOLIN HFA) 108 (90 Base) MCG/ACT inhaler Inhale 2 puffs into the lungs every 6 (six) hours as needed for wheezing or shortness of breath. 1 Inhaler 2  . amLODipine (NORVASC) 5 MG tablet Take 1 tablet (5 mg total) by mouth daily. MD re-ordered at decreased dose 30 tablet 5  . atorvastatin (LIPITOR) 10 MG tablet TAKE 1 TABLET BY MOUTH IN THE MORNING 90 tablet 0  . baclofen (LIORESAL) 10 MG tablet Take 1 tablet (10 mg total) by mouth 2 (two) times daily. 30 each 0  . cholecalciferol (VITAMIN D) 1000 units tablet Take 1,000 Units by mouth daily.    . diphenoxylate-atropine (LOMOTIL) 2.5-0.025 MG tablet Take 1 tablet by mouth 4 (four) times daily as needed for diarrhea or loose stools. 30 tablet 2  . fluticasone (FLONASE) 50 MCG/ACT nasal spray Use 2 spray(s)  in each nostril once daily 16 g 0  . gabapentin (NEURONTIN) 300 MG capsule Take 300-600 mg by mouth 4 (four) times daily as needed (neuropathic pain).     Marland Kitchen letrozole (FEMARA) 2.5 MG tablet Take 2.5 mg by mouth daily.    Marland Kitchen lidocaine-prilocaine (EMLA) cream Apply to affected area once 30 g 3  . NARCAN 4 MG/0.1ML LIQD nasal spray kit Place 1 spray into the nose.    Marland Kitchen Oxycodone HCl 10 MG TABS LIMIT ONE HALF TO ONE TABLET BY MOUTH 3 TO 5 TIMES DAILY IF TOLERATED    . potassium chloride SA (KLOR-CON) 20 MEQ tablet Take 1 tablet (20 mEq total) by mouth 2 (two) times daily. 60 tablet 1  . pyridoxine (B-6) 100 MG tablet Take 100 mg by mouth daily.    . sertraline (ZOLOFT) 100 MG tablet Take 2 tablets by mouth once daily 180 tablet 0  . vitamin B-12 (CYANOCOBALAMIN) 500 MCG tablet Take 500 mcg by mouth daily.     No current facility-administered medications for this visit.   Facility-Administered Medications Ordered in Other Visits  Medication Dose Route Frequency Provider Last Rate Last Admin  . heparin lock flush 100 unit/mL  500 Units Intravenous Once [name], MD       . heparin lock flush 100 unit/mL  500 Units Intravenous Once [name], MD      . sodium chloride flush (NS) 0.9 % injection 10 mL  10 mL Intravenous PRN [name], MD   10 mL at 02/27/19 0925  . sodium chloride flush (NS) 0.9 % injection 10 mL  10 mL Intravenous PRN [name], MD        OBJECTIVE: Vitals:   01/01/20 1000  BP: 108/82  Pulse: 79  Resp: 20  Temp: 98.2 F (36.8 C)  SpO2: 100%     Body mass index is 34.65 kg/m.    ECOG FS:1 - Symptomatic but completely ambulatory  General: Well-developed, well-nourished, no acute distress. Eyes: Pink conjunctiva, anicteric sclera. HEENT: Normocephalic, moist mucous membranes. Lungs: No audible wheezing or coughing. Heart: Regular rate and rhythm. Abdomen: Soft, nontender, no obvious distention. Musculoskeletal: No edema, cyanosis, or clubbing. Neuro: Alert, answering all questions appropriately. Cranial nerves grossly intact. Skin: No rashes or petechiae noted. Psych: Normal affect.   LAB RESULTS:  Lab Results  Component Value Date   NA 139 01/01/2020   K 3.7 01/01/2020   CL 103 01/01/2020   CO2 27 01/01/2020   GLUCOSE 142 (H) 01/01/2020   BUN 15 01/01/2020   CREATININE 1.21 (H) 01/01/2020   CALCIUM 9.2 01/01/2020   PROT 7.3 01/01/2020   ALBUMIN 3.9 01/01/2020   AST 21 01/01/2020   ALT 16 01/01/2020   ALKPHOS 90 01/01/2020   BILITOT 0.7 01/01/2020   GFRNONAA 46 (L) 01/01/2020   GFRAA 53 (L) 01/01/2020    Lab Results  Component Value Date   WBC 7.7 01/01/2020   NEUTROABS 5.4 01/01/2020   HGB 11.9 (L) 01/01/2020   HCT 34.3 (L) 01/01/2020   MCV 81.5 01/01/2020   PLT 220 01/01/2020     STUDIES: No results found.  ASSESSMENT: Stage IIa ER+ left breast cancer with no breast lesion and axillary lymph node metastasis. (TxN1M0)  HER-2 negative.  Now with stage IIIa colon cancer.  PLAN:   1.  Stage IIIa colon cancer: Pathology report reviewed independently.  Patient's most recent  imaging on August 07, 2019 with CT scans revealed no evidence of  recurrent or progressive disease.  Previously, patient agreed to enroll in clinical trial.  Oxaliplatin was discontinued early secondary to worsening neuropathy.  Patient completed 12 cycles of chemotherapy on July 31, 2019.  Continue with maintenance Tecentriq for an additional 6 months, tentatively completing treatment on January 29, 2020.  Proceed with treatment today.  Return to clinic in 2 weeks for repeat laboratory, further evaluation, and continuation of Tecentriq.  2. Stage IIa ER+ left breast cancer with no breast lesion and axillary lymph node metastasis: Despite no obvious breast lesion on mammogram or breast MRI, pathology and pattern of spread was consistent with primary breast cancer. CT and bone scan at time of diagnosis revealed no other evidence of malignancy.  She only received 3 cycles of Adriamycin and Cytoxan. Taxol was discontinued early as well secondary to persistent peripheral neuropathy. Patient completed her chemotherapy on Nov 19, 2014.  She elected not to pursue axillary node dissection given the potential morbidity of this procedure. It was also elected not to pursue adjuvant XRT given there was no primary breast lesion.  Patient completed approximately 4 years of treatment with letrozole, but this was discontinued secondary to enrolling in clinical trial for her newly diagnosed colon cancer.  Her most recent mammogram on July 04, 2019 was reported as BI-RADS 1.  Repeat in December 2021.  3.  Bone health: Patient's most recent bone mineral density on February 03, 2018 reported T score of -0.7 which is unchanged from 3 years prior.  Consider repeat bone mineral density in July 2021.   4.  Lynch syndrome: Genetic testing confirmed patient has Lynch syndrome.  She reports her sister and son both have tested positive.  She plans to talk to the rest of her children about genetic testing.  Appreciate genetics input.  5.   Endometrial cancer: Patient has had a total hysterectomy.  6.  Back pain/sciatica: Chronic and unchanged.  Continue monthly follow-up with pain clinic as scheduled.  Patient reports insurance will only allow 3 injections every 6 months, therefore she cannot receive additional treatment until June 2021.  7.  Peripheral neuropathy: Grade 3.  Patient is now taking gabapentin 600 mg 4 times per day.  Continue follow-up with pain clinic as scheduled.  8.  Anemia: Mild.  Patient's hemoglobin is 11.9. 9.  Renal insufficiency: Creatinine has trended up slightly to 1.21.  Monitor. 10.  Hypokalemia: Resolved.  Continue oral potassium supplementation.  11.  Diarrhea: Resolved.  Continue Imodium and Lomotil as needed. 12.  Fatigue: Chronic and unchanged.   13.  Hypotension: Resolved.  Continue Norvasc 5 mg daily. 14.  Port associated clot: Patient has now completed greater than 3 months of Eliquis and has discontinued treatment.  Patient has expressed interest in removing her port once treatment is completed.  Patient expressed understanding and was in agreement with this plan. She also understands that She can call clinic at any time with any questions, concerns, or complaints.   Breast cancer metastasized to axillary lymph node   Staging form: Breast, AJCC 7th Edition     Clinical stage from 10/22/2014: Stage Unknown (TX, N1, M0) - Signed by [name], MD on 10/22/2014   [name], MD   01/01/2020 10:26 AM

## 2020-01-01 ENCOUNTER — Other Ambulatory Visit: Payer: Self-pay

## 2020-01-01 ENCOUNTER — Inpatient Hospital Stay (HOSPITAL_BASED_OUTPATIENT_CLINIC_OR_DEPARTMENT_OTHER): Payer: Medicare HMO | Admitting: Oncology

## 2020-01-01 ENCOUNTER — Encounter: Payer: Self-pay | Admitting: *Deleted

## 2020-01-01 ENCOUNTER — Inpatient Hospital Stay: Payer: Medicare HMO

## 2020-01-01 VITALS — BP 108/82 | HR 79 | Temp 98.2°F | Resp 20 | Wt 195.6 lb

## 2020-01-01 DIAGNOSIS — Z86718 Personal history of other venous thrombosis and embolism: Secondary | ICD-10-CM | POA: Diagnosis not present

## 2020-01-01 DIAGNOSIS — M5432 Sciatica, left side: Secondary | ICD-10-CM | POA: Diagnosis not present

## 2020-01-01 DIAGNOSIS — Z853 Personal history of malignant neoplasm of breast: Secondary | ICD-10-CM | POA: Diagnosis not present

## 2020-01-01 DIAGNOSIS — Z1509 Genetic susceptibility to other malignant neoplasm: Secondary | ICD-10-CM

## 2020-01-01 DIAGNOSIS — G629 Polyneuropathy, unspecified: Secondary | ICD-10-CM | POA: Diagnosis not present

## 2020-01-01 DIAGNOSIS — C184 Malignant neoplasm of transverse colon: Secondary | ICD-10-CM

## 2020-01-01 DIAGNOSIS — Z006 Encounter for examination for normal comparison and control in clinical research program: Secondary | ICD-10-CM | POA: Diagnosis not present

## 2020-01-01 DIAGNOSIS — Z87891 Personal history of nicotine dependence: Secondary | ICD-10-CM

## 2020-01-01 DIAGNOSIS — Z95828 Presence of other vascular implants and grafts: Secondary | ICD-10-CM

## 2020-01-01 DIAGNOSIS — C189 Malignant neoplasm of colon, unspecified: Secondary | ICD-10-CM

## 2020-01-01 DIAGNOSIS — Z79899 Other long term (current) drug therapy: Secondary | ICD-10-CM

## 2020-01-01 DIAGNOSIS — M549 Dorsalgia, unspecified: Secondary | ICD-10-CM

## 2020-01-01 DIAGNOSIS — R5383 Other fatigue: Secondary | ICD-10-CM | POA: Diagnosis not present

## 2020-01-01 DIAGNOSIS — Z8542 Personal history of malignant neoplasm of other parts of uterus: Secondary | ICD-10-CM | POA: Diagnosis not present

## 2020-01-01 DIAGNOSIS — Z90710 Acquired absence of both cervix and uterus: Secondary | ICD-10-CM

## 2020-01-01 LAB — CBC WITH DIFFERENTIAL/PLATELET
Abs Immature Granulocytes: 0.02 10*3/uL (ref 0.00–0.07)
Basophils Absolute: 0 10*3/uL (ref 0.0–0.1)
Basophils Relative: 0 %
Eosinophils Absolute: 0.1 10*3/uL (ref 0.0–0.5)
Eosinophils Relative: 1 %
HCT: 34.3 % — ABNORMAL LOW (ref 36.0–46.0)
Hemoglobin: 11.9 g/dL — ABNORMAL LOW (ref 12.0–15.0)
Immature Granulocytes: 0 %
Lymphocytes Relative: 23 %
Lymphs Abs: 1.7 10*3/uL (ref 0.7–4.0)
MCH: 28.3 pg (ref 26.0–34.0)
MCHC: 34.7 g/dL (ref 30.0–36.0)
MCV: 81.5 fL (ref 80.0–100.0)
Monocytes Absolute: 0.4 10*3/uL (ref 0.1–1.0)
Monocytes Relative: 5 %
Neutro Abs: 5.4 10*3/uL (ref 1.7–7.7)
Neutrophils Relative %: 71 %
Platelets: 220 10*3/uL (ref 150–400)
RBC: 4.21 MIL/uL (ref 3.87–5.11)
RDW: 14.6 % (ref 11.5–15.5)
WBC: 7.7 10*3/uL (ref 4.0–10.5)
nRBC: 0 % (ref 0.0–0.2)

## 2020-01-01 LAB — COMPREHENSIVE METABOLIC PANEL
ALT: 16 U/L (ref 0–44)
AST: 21 U/L (ref 15–41)
Albumin: 3.9 g/dL (ref 3.5–5.0)
Alkaline Phosphatase: 90 U/L (ref 38–126)
Anion gap: 9 (ref 5–15)
BUN: 15 mg/dL (ref 8–23)
CO2: 27 mmol/L (ref 22–32)
Calcium: 9.2 mg/dL (ref 8.9–10.3)
Chloride: 103 mmol/L (ref 98–111)
Creatinine, Ser: 1.21 mg/dL — ABNORMAL HIGH (ref 0.44–1.00)
GFR calc Af Amer: 53 mL/min — ABNORMAL LOW (ref >60)
GFR calc non Af Amer: 46 mL/min — ABNORMAL LOW (ref >60)
Glucose, Bld: 142 mg/dL — ABNORMAL HIGH (ref 70–99)
Potassium: 3.7 mmol/L (ref 3.5–5.1)
Sodium: 139 mmol/L (ref 135–145)
Total Bilirubin: 0.7 mg/dL (ref 0.3–1.2)
Total Protein: 7.3 g/dL (ref 6.5–8.1)

## 2020-01-01 MED ORDER — SODIUM CHLORIDE 0.9% FLUSH
10.0000 mL | INTRAVENOUS | Status: DC | PRN
  Filled 2020-01-01: qty 10

## 2020-01-01 MED ORDER — HEPARIN SOD (PORK) LOCK FLUSH 100 UNIT/ML IV SOLN
INTRAVENOUS | Status: AC
  Filled 2020-01-01: qty 5

## 2020-01-01 MED ORDER — HEPARIN SOD (PORK) LOCK FLUSH 100 UNIT/ML IV SOLN
500.0000 [IU] | Freq: Once | INTRAVENOUS | Status: AC | PRN
  Administered 2020-01-01: 500 [IU]
  Filled 2020-01-01: qty 5

## 2020-01-01 MED ORDER — SODIUM CHLORIDE 0.9 % IV SOLN
840.0000 mg | Freq: Once | INTRAVENOUS | Status: AC
  Administered 2020-01-01: 840 mg via INTRAVENOUS
  Filled 2020-01-01: qty 14

## 2020-01-01 MED ORDER — SODIUM CHLORIDE 0.9 % IV SOLN
Freq: Once | INTRAVENOUS | Status: AC
  Filled 2020-01-01: qty 250

## 2020-01-01 NOTE — Research (Signed)
Patient Carrie Alvarez presents to clinic this morning for consideration of her C23D1 Huey Bienenstock only treatment on the [id number] [name] study. Patient reports she is only having about 2 bowel movements per day and denies any further diarrhea. States she is still having some numbness and tingling in her fingers and tenderness in her hands at times, but reports her feet and ankles bilaterally feel like they are wrapped in an Ace bandage this morning and reports slightly increased neuropathy in her BLE. States her pain management physician, Dr. Josefa Half increased her Gabapentin to 600mg  four times daily now, but she has not noticed any difference in her neuropathy yet. She continues to reports her chronic back pain is intense at times and she is back in the wheelchair today, but states that is mostly due to her neuropathy in her feet.. She continues to be seen monthly in the pain management clinic for her back pain and pain medication management. States her hands are better and almost back to normal with much improved color is returning, but states they still feel tender at times and she continues to experience some mild tingling and numbness in fingertips bilaterally. Patient still reporting some ongoing fatigue, but states she now has periods where she actually feels rested at times. She is not using the wheelchair while in clinic today, and demonstrates she is able to ambulate independently using her cane. She also reports managing all of her self-care needs without assistance. Ms. Tedder remains afebrile and SpO2 was measured at 100% at rest, on room air while while wearing a mask. Her appetite remains poor to "non-existant", but patient reports she tries to eat at least 3 times daily. Her weight is within usual range. Patient continues to report "a little bit" of abdominal pain, which remains mild, transient, and located to the right her old incision site. Solicited adverse events were assessed by this RN  today, and patient completed the PRO-CTCAE booklet in clinic prior to infusion. Local labs were collected via port-a-cath this morning and CBC and chemistry values were reviewed by Dr. Grayland Ormond. Potassium remains within normal limits this morning at 3.7 mmol/L and patient continues to take her potassium supplement daily. Serum creatinine is elevated a little more today at 1.21 mg/dL, and CrCl calculation is 61.44 mL/min. Hgb is barey below normal at 11.9 g/dL this morning. Other labs are WNL for patient to receive her treatment today. H&P completed by Dr. Grayland Ormond and patient will proceed with Cycle 23 Atezolizumab as scheduled today. Solicited and other Adverse events with grade and attribution are as noted below:   Adverse Event Log  Study/Protocol: [id number] [name] Cycle: Cycle 22 Event Grade Onset Date Resolved Date Drug Name Attribution Treatment Comments  Diarrhea 0      Reports Lomotil worked   Constipation 0      Denies  Nausea 0     unrelated    Fatigue 1 01/01/2020   possible  Reports feeling rested at times  Fatigue 2 06/12/2018 01/01/2020  possible  Reports no energy   Anorexia 1 07/13/2008   unrelated  States this is her norm   Cough 0        Dyspnea 0        Fever 0        UTI 0        Palmer-plantar erythrodesia 1 03/13/2019   probable otc cream Reports this is almost resolved  Total biliruben 0        Neutrophils 0  TSH 0        Sensory Peripheral Neuropathy 2 07/11/2019     unrelated Gabapentin - dose recently increased  Numbness & Tingling in fingertips, toes & feet bil. from prior Taxol & Oxaliplatin  Anemia 1 01/01/2020     possible  Hgb 11.9 today  Elevated creatinine 1 11/20/2019    unrelated  1.21 mg/dL today  Abdominal pain 1 10/23/2019     unlikely  Still Reports infrequent mild pain   Chronic back & left leg pain 2 07/13/2017   unrelated  Seen monthly at pain management clinic  [facility], BSN, MHA, OCN 01/01/2020 11:08 AM

## 2020-01-02 LAB — THYROID PANEL WITH TSH
Free Thyroxine Index: 1 — ABNORMAL LOW (ref 1.2–4.9)
T3 Uptake Ratio: 26 % (ref 24–39)
T4, Total: 3.7 ug/dL — ABNORMAL LOW (ref 4.5–12.0)
TSH: 1.54 u[IU]/mL (ref 0.450–4.500)

## 2020-01-02 LAB — CEA: CEA: 4.3 ng/mL (ref 0.0–4.7)

## 2020-01-10 ENCOUNTER — Other Ambulatory Visit: Payer: Self-pay | Admitting: Physician Assistant

## 2020-01-10 DIAGNOSIS — E7800 Pure hypercholesterolemia, unspecified: Secondary | ICD-10-CM

## 2020-01-10 DIAGNOSIS — F32A Depression, unspecified: Secondary | ICD-10-CM

## 2020-01-10 NOTE — Telephone Encounter (Signed)
Requested Prescriptions  Pending Prescriptions Disp Refills  . atorvastatin (LIPITOR) 10 MG tablet [Pharmacy Med Name: Atorvastatin Calcium 10 MG Oral Tablet] 90 tablet 0    Sig: TAKE 1 TABLET BY MOUTH IN THE MORNING     Cardiovascular:  Antilipid - Statins Failed - 01/10/2020  5:31 AM      Failed - LDL in normal range and within 360 days    LDL Chol Calc (NIH)  Date Value Ref Range Status  03/23/2019 48 0 - 99 mg/dL Final         Passed - Total Cholesterol in normal range and within 360 days    Cholesterol, Total  Date Value Ref Range Status  03/23/2019 139 100 - 199 mg/dL Final         Passed - HDL in normal range and within 360 days    HDL  Date Value Ref Range Status  03/23/2019 76 >39 mg/dL Final         Passed - Triglycerides in normal range and within 360 days    Triglycerides  Date Value Ref Range Status  03/23/2019 79 0 - 149 mg/dL Final         Passed - Patient is not pregnant      Passed - Valid encounter within last 12 months    Recent Outpatient Visits          1 month ago Annual physical exam   [facility] Carles Collet M, PA-C   9 months ago Essential hypertension   [place], Wendee Beavers, [place]   1 year ago Annual physical exam   [facility], [place], [place]   1 year ago Acute non-recurrent frontal sinusitis   [facility] [place], Wendee Beavers, [place]   1 year ago Essential hypertension   [facility] Trinna Post, [place]      Future Appointments            In 10 months Trinna Post, PA-C [employer], [facility]           . sertraline (ZOLOFT) 100 MG tablet [Pharmacy Med Name: Sertraline HCl 100 MG Oral Tablet] 180 tablet 0    Sig: Take 2 tablets by mouth once daily     Psychiatry:  Antidepressants - SSRI Passed - 01/10/2020  5:31 AM      Passed - Completed PHQ-2 or PHQ-9 in the last 360 days.      Passed - Valid encounter within last 6 months     Recent Outpatient Visits          1 month ago Annual physical exam   [facility] Carles Collet M, [place]   9 months ago Essential hypertension   [facility] Trinna Post, [place]   1 year ago Annual physical exam   [facility] Carles Collet M, [place]   1 year ago Acute non-recurrent frontal sinusitis   [facility] Trinna Post, [place]   1 year ago Essential hypertension   [facility] Trinna Post, [place]      Future Appointments            In 10 months Trinna Post, PA-C [employer], [place]

## 2020-01-12 DIAGNOSIS — G894 Chronic pain syndrome: Secondary | ICD-10-CM | POA: Diagnosis not present

## 2020-01-14 NOTE — Progress Notes (Signed)
[place]  [phone] [phone]) [phone]  ID: Carrie Alvarez OB: 07/03/1951  MR#: [id number]  JGO#:[id number]  Patient Care Team: Paulene Floor as PCP - General (Physician Assistant) [name], MD as Consulting Physician (Oncology) [name], MD as Attending Physician (Pain Medicine) [name]., MD as Consulting Physician (Ophthalmology) [name], RN as Oncology Nurse Navigator Normajean Glasgow, MD as Attending Physician (Physical Medicine and Rehabilitation)  CHIEF COMPLAINT: Stage IIa ER+ left breast cancer with no breast lesion and axillary lymph node metastasis.  Now with stage IIIa colon cancer.  INTERVAL HISTORY: Patient returns clinic today for further evaluation and continuation of Tecentriq on clinical trial.  Her back and left leg pain are significantly worse today.  She continues to have chronic weakness and fatigue. She denies any recent fevers or illnesses.  She has no chest pain, shortness of breath, cough, or hemoptysis.  She denies any nausea, vomiting, constipation, or diarrhea. She has no melena or hematochezia.  She has no urinary complaints.  Patient offers no further specific complaints today.  REVIEW OF SYSTEMS:   Review of Systems  Constitutional: Positive for malaise/fatigue. Negative for fever and weight loss.  Respiratory: Negative.  Negative for cough and shortness of breath.   Cardiovascular: Negative.  Negative for chest pain and leg swelling.  Gastrointestinal: Negative.  Negative for abdominal pain, blood in stool, constipation, diarrhea, melena, nausea and vomiting.  Genitourinary: Negative.  Negative for dysuria.  Musculoskeletal: Positive for back pain. Negative for neck pain.  Skin: Negative.  Negative for rash.  Neurological: Positive for tingling, sensory change and weakness. Negative for dizziness, focal weakness and headaches.  Psychiatric/Behavioral: Negative.  Negative for  depression. The patient is not nervous/anxious.     As per HPI. Otherwise, a complete review of systems is negative.  PAST MEDICAL HISTORY: Past Medical History:  Diagnosis Date  . Allergy   . Anemia   . Back pain   . Breast cancer ([name]) 2015   LT LUMPECTOMY 12-2014 FOLLOWING CHEMO  . Carpal tunnel syndrome    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  . Cataract   . Colon cancer ([place]) 01/2019  . Depression   . Family history of breast cancer   . Family history of colon cancer   . GERD (gastroesophageal reflux disease)    problem with reflux during chemo  . History of methicillin resistant staphylococcus aureus (MRSA)   . Hypercholesteremia   . Hypertension   . Lumbosacral pain    sees pain management in [place]  . Obesity   . Personal history of chemotherapy 2016   left breast ca  . Personal history of chemotherapy 2020   colon ca  . Pinched nerve    left side, lumbar area  . Uterine cancer ([place]) 1994    PAST SURGICAL HISTORY: Past Surgical History:  Procedure Laterality Date  . ABDOMINAL HYSTERECTOMY  1994   UTERINE CA  . AXILLARY LYMPH NODE BIOPSY Left 12/18/2014   Procedure: AXILLARY LYMPH NODE BIOPSY/;  Surgeon: Robert Bellow, MD;  Location: [facility];  Service: General;  Laterality: Left;  . AXILLARY LYMPH NODE DISSECTION Left 12/18/2014   Procedure: AXILLARY LYMPH NODE DISSECTION;  Surgeon: Robert Bellow, MD;  Location: [facility];  Service: General;  Laterality: Left;  . BREAST BIOPSY Left 05/2014   CORE BX OF LN, METASTATIC ADENOCARCINOMA  . BREAST LUMPECTOMY Left 12/2014   CHEMO FIRST THEN SURGERY OF LN  . BREAST  SURGERY     lymph node removal  . CHOLECYSTECTOMY N/A 04/19/2016   Procedure: LAPAROSCOPIC CHOLECYSTECTOMY WITH INTRAOPERATIVE CHOLANGIOGRAM;  Surgeon: Hubbard Robinson, MD;  Location: [facility];  Service: General;  Laterality: N/A;  . COLON RESECTION Right 01/10/2019   Procedure: HAND ASSISTED LAPAROSCOPIC RIGHT COLON RESECTION;  Surgeon:  Jules Husbands, MD;  Location: [facility];  Service: General;  Laterality: Right;  . COLONOSCOPY WITH PROPOFOL N/A 12/30/2018   Procedure: COLONOSCOPY WITH PROPOFOL;  Surgeon: Lucilla Lame, MD;  Location: [facility];  Service: Endoscopy;  Laterality: N/A;  . medial branch block  11/06/2015   lumbar facet Dr. Primus Bravo  . POLYPECTOMY N/A 12/30/2018   Procedure: POLYPECTOMY;  Surgeon: Lucilla Lame, MD;  Location: [place];  Service: Endoscopy;  Laterality: N/A;  Clips placed at Hepatic Flexure Polyp (2) and Transverse Colon Polyp (4) removal sites  . PORTACATH PLACEMENT Right 02/02/2019   Procedure: INSERTION PORT-A-CATH;  Surgeon: Jules Husbands, MD;  Location: [facility];  Service: General;  Laterality: Right;  . SENTINEL NODE BIOPSY Left 12/18/2014   Procedure: SENTINEL NODE BIOPSY;  Surgeon: Robert Bellow, MD;  Location: [facility];  Service: General;  Laterality: Left;    FAMILY HISTORY Family History  Problem Relation Age of Onset  . Breast cancer Sister 75  . Colon cancer Sister   . Colon cancer Mother        dx 26s  . Hypertension Mother   . Asthma Mother   . Cancer Father        voice box removed  . Cancer Maternal Grandmother        unsure type  . Colon cancer Cousin   . Cancer Cousin        unsure type       ADVANCED DIRECTIVES:    HEALTH MAINTENANCE: Social History   Tobacco Use  . Smoking status: Former Smoker    Packs/day: 0.50    Types: Cigarettes    Quit date: 07/18/2014    Years since quitting: 5.5  . Smokeless tobacco: Never Used  Vaping Use  . Vaping Use: Never used  Substance Use Topics  . Alcohol use: No    Alcohol/week: 0.0 standard drinks  . Drug use: No    No Known Allergies  Current Outpatient Medications  Medication Sig Dispense Refill  . acetaminophen (TYLENOL) 500 MG tablet Take 500 mg by mouth every 6 (six) hours as needed for mild pain or moderate pain.     Marland Kitchen albuterol (PROVENTIL HFA;VENTOLIN HFA) 108 (90 Base) MCG/ACT  inhaler Inhale 2 puffs into the lungs every 6 (six) hours as needed for wheezing or shortness of breath. 1 Inhaler 2  . amLODipine (NORVASC) 5 MG tablet Take 1 tablet (5 mg total) by mouth daily. MD re-ordered at decreased dose 30 tablet 5  . atorvastatin (LIPITOR) 10 MG tablet TAKE 1 TABLET BY MOUTH IN THE MORNING 90 tablet 0  . baclofen (LIORESAL) 10 MG tablet Take 1 tablet (10 mg total) by mouth 2 (two) times daily. 30 each 0  . cholecalciferol (VITAMIN D) 1000 units tablet Take 1,000 Units by mouth daily.    . diphenoxylate-atropine (LOMOTIL) 2.5-0.025 MG tablet Take 1 tablet by mouth 4 (four) times daily as needed for diarrhea or loose stools. 30 tablet 2  . gabapentin (NEURONTIN) 300 MG capsule Take 300-600 mg by mouth 4 (four) times daily as needed (neuropathic pain).     Marland Kitchen letrozole (FEMARA) 2.5 MG tablet Take 2.5  mg by mouth daily.    Marland Kitchen lidocaine-prilocaine (EMLA) cream Apply to affected area once 30 g 3  . NARCAN 4 MG/0.1ML LIQD nasal spray kit Place 1 spray into the nose.    Marland Kitchen Oxycodone HCl 10 MG TABS LIMIT ONE HALF TO ONE TABLET BY MOUTH 3 TO 5 TIMES DAILY IF TOLERATED    . potassium chloride SA (KLOR-CON) 20 MEQ tablet Take 1 tablet (20 mEq total) by mouth 2 (two) times daily. 60 tablet 1  . pyridoxine (B-6) 100 MG tablet Take 100 mg by mouth daily.    . sertraline (ZOLOFT) 100 MG tablet Take 2 tablets by mouth once daily 180 tablet 1  . vitamin B-12 (CYANOCOBALAMIN) 500 MCG tablet Take 500 mcg by mouth daily.    . fluticasone (FLONASE) 50 MCG/ACT nasal spray Place 2 sprays into both nostrils daily. 16 g 2   No current facility-administered medications for this visit.   Facility-Administered Medications Ordered in Other Visits  Medication Dose Route Frequency Provider Last Rate Last Admin  . heparin lock flush 100 unit/mL  500 Units Intravenous Once [name], MD      . heparin lock flush 100 unit/mL  500 Units Intravenous Once [name], MD      . sodium  chloride flush (NS) 0.9 % injection 10 mL  10 mL Intravenous PRN [name], MD   10 mL at 02/27/19 0925    OBJECTIVE: Vitals:   01/17/20 0928  BP: 114/90  Pulse: 68  Resp: 16  Temp: (!) 96.8 F (36 C)  SpO2: 100%     Body mass index is 35.69 kg/m.    ECOG FS:1 - Symptomatic but completely ambulatory  General: Well-developed, well-nourished, no acute distress.  Sitting in a wheelchair. Eyes: Pink conjunctiva, anicteric sclera. HEENT: Normocephalic, moist mucous membranes. Lungs: No audible wheezing or coughing. Heart: Regular rate and rhythm. Abdomen: Soft, nontender, no obvious distention. Musculoskeletal: No edema, cyanosis, or clubbing. Neuro: Alert, answering all questions appropriately. Cranial nerves grossly intact. Skin: No rashes or petechiae noted. Psych: Normal affect.   LAB RESULTS:  Lab Results  Component Value Date   NA 141 01/17/2020   K 3.8 01/17/2020   CL 106 01/17/2020   CO2 27 01/17/2020   GLUCOSE 96 01/17/2020   BUN 10 01/17/2020   CREATININE 1.02 (H) 01/17/2020   CALCIUM 9.3 01/17/2020   PROT 6.9 01/17/2020   ALBUMIN 3.7 01/17/2020   AST 18 01/17/2020   ALT 16 01/17/2020   ALKPHOS 86 01/17/2020   BILITOT 0.6 01/17/2020   GFRNONAA 56 (L) 01/17/2020   GFRAA >60 01/17/2020    Lab Results  Component Value Date   WBC 7.0 01/17/2020   NEUTROABS 4.8 01/17/2020   HGB 11.1 (L) 01/17/2020   HCT 31.3 (L) 01/17/2020   MCV 79.4 (L) 01/17/2020   PLT 194 01/17/2020     STUDIES: No results found.  ASSESSMENT: Stage IIa ER+ left breast cancer with no breast lesion and axillary lymph node metastasis. (TxN1M0)  HER-2 negative.  Now with stage IIIa colon cancer.  PLAN:   1.  Stage IIIa colon cancer: Pathology report reviewed independently.  Patient's most recent imaging on August 07, 2019 with CT scans revealed no evidence of recurrent or progressive disease.  Previously, patient agreed to enroll in clinical trial.  Oxaliplatin was  discontinued early secondary to worsening neuropathy.  Patient completed 12 cycles of chemotherapy on July 31, 2019.  Continue with maintenance Tecentriq for an additional  6 months, tentatively completing treatment on January 29, 2020.  Proceed with treatment today.  Return to clinic in 2 weeks for further evaluation and her final infusion of Tecentriq.  Per study protocol, patient will require CT scan approximately 4 weeks after her final treatment.  Patient's last colonoscopy was on December 27, 2018.  She will require a repeat colonoscopy at the completion of her treatment.  2. Stage IIa ER+ left breast cancer with no breast lesion and axillary lymph node metastasis: Despite no obvious breast lesion on mammogram or breast MRI, pathology and pattern of spread was consistent with primary breast cancer. CT and bone scan at time of diagnosis revealed no other evidence of malignancy.  She only received 3 cycles of Adriamycin and Cytoxan. Taxol was discontinued early as well secondary to persistent peripheral neuropathy. Patient completed her chemotherapy on Nov 19, 2014.  She elected not to pursue axillary node dissection given the potential morbidity of this procedure. It was also elected not to pursue adjuvant XRT given there was no primary breast lesion.  Patient completed approximately 4 years of treatment with letrozole, but this was discontinued secondary to enrolling in clinical trial for her newly diagnosed colon cancer.  Her most recent mammogram on July 04, 2019 was reported as BI-RADS 1.  Repeat in December 2021.  3.  Bone health: Patient's most recent bone mineral density on February 03, 2018 reported T score of -0.7 which is unchanged from 3 years prior.  Consider repeat bone mineral density in July 2021.   4.  Lynch syndrome: Genetic testing confirmed patient has Lynch syndrome.  She reports her sister and son both have tested positive.  She plans to talk to the rest of her children about genetic  testing.  Appreciate genetics input.  5.  Endometrial cancer: Patient has had a total hysterectomy.  6.  Back pain/sciatica: Chronic and unchanged.  Continue monthly follow-up with pain clinic as scheduled.  Patient reports insurance will only allow 3 injections every 6 months.  Patient plans to call the pain clinic later this week to get an appointment.  7.  Peripheral neuropathy: Grade 3.  Patient is now taking gabapentin 600 mg 4 times per day.  Continue follow-up with pain clinic as scheduled.  8.  Anemia: Mild.  Hemoglobin has trended down slightly to 11.1. 9.  Renal insufficiency: Resolved. 10.  Hypokalemia: Resolved.  Continue oral potassium supplementation.  11.  Diarrhea: Resolved.  Continue Imodium and Lomotil as needed. 12.  Fatigue: Chronic and unchanged.   13.  Hypotension: Resolved.  Continue Norvasc 5 mg daily. 14.  Port associated clot: Patient has now completed greater than 3 months of Eliquis and has discontinued treatment.  Patient has expressed interest in removing her port once treatment is completed.  Patient expressed understanding and was in agreement with this plan. She also understands that She can call clinic at any time with any questions, concerns, or complaints.   Breast cancer metastasized to axillary lymph node   Staging form: Breast, AJCC 7th Edition     Clinical stage from 10/22/2014: Stage Unknown (TX, N1, M0) - Signed by [name], MD on 10/22/2014   [name], MD   01/17/2020 2:08 PM

## 2020-01-16 ENCOUNTER — Encounter: Payer: Self-pay | Admitting: Oncology

## 2020-01-16 NOTE — Progress Notes (Signed)
Patient was called for pre assessment. She denies any pain or concerns at this time.  

## 2020-01-17 ENCOUNTER — Other Ambulatory Visit: Payer: Self-pay

## 2020-01-17 ENCOUNTER — Encounter: Payer: Self-pay | Admitting: *Deleted

## 2020-01-17 ENCOUNTER — Encounter: Payer: Self-pay | Admitting: Oncology

## 2020-01-17 ENCOUNTER — Inpatient Hospital Stay: Payer: Medicare HMO | Attending: Oncology

## 2020-01-17 ENCOUNTER — Inpatient Hospital Stay: Payer: Medicare HMO

## 2020-01-17 ENCOUNTER — Inpatient Hospital Stay (HOSPITAL_BASED_OUTPATIENT_CLINIC_OR_DEPARTMENT_OTHER): Payer: Medicare HMO | Admitting: Oncology

## 2020-01-17 ENCOUNTER — Other Ambulatory Visit: Payer: Self-pay | Admitting: Physician Assistant

## 2020-01-17 VITALS — BP 125/87 | HR 66 | Temp 96.8°F | Resp 18

## 2020-01-17 VITALS — BP 114/90 | HR 68 | Temp 96.8°F | Resp 16 | Ht 63.0 in | Wt 201.5 lb

## 2020-01-17 DIAGNOSIS — C184 Malignant neoplasm of transverse colon: Secondary | ICD-10-CM

## 2020-01-17 DIAGNOSIS — R5383 Other fatigue: Secondary | ICD-10-CM | POA: Insufficient documentation

## 2020-01-17 DIAGNOSIS — Z5112 Encounter for antineoplastic immunotherapy: Secondary | ICD-10-CM | POA: Diagnosis present

## 2020-01-17 DIAGNOSIS — Z1509 Genetic susceptibility to other malignant neoplasm: Secondary | ICD-10-CM | POA: Insufficient documentation

## 2020-01-17 DIAGNOSIS — Z006 Encounter for examination for normal comparison and control in clinical research program: Secondary | ICD-10-CM

## 2020-01-17 DIAGNOSIS — Z85038 Personal history of other malignant neoplasm of large intestine: Secondary | ICD-10-CM | POA: Insufficient documentation

## 2020-01-17 DIAGNOSIS — J309 Allergic rhinitis, unspecified: Secondary | ICD-10-CM

## 2020-01-17 DIAGNOSIS — M549 Dorsalgia, unspecified: Secondary | ICD-10-CM | POA: Insufficient documentation

## 2020-01-17 DIAGNOSIS — Z95828 Presence of other vascular implants and grafts: Secondary | ICD-10-CM

## 2020-01-17 DIAGNOSIS — Z9221 Personal history of antineoplastic chemotherapy: Secondary | ICD-10-CM | POA: Insufficient documentation

## 2020-01-17 DIAGNOSIS — C189 Malignant neoplasm of colon, unspecified: Secondary | ICD-10-CM | POA: Diagnosis present

## 2020-01-17 DIAGNOSIS — Z87891 Personal history of nicotine dependence: Secondary | ICD-10-CM | POA: Insufficient documentation

## 2020-01-17 DIAGNOSIS — Z8542 Personal history of malignant neoplasm of other parts of uterus: Secondary | ICD-10-CM | POA: Insufficient documentation

## 2020-01-17 DIAGNOSIS — M5432 Sciatica, left side: Secondary | ICD-10-CM | POA: Insufficient documentation

## 2020-01-17 DIAGNOSIS — D649 Anemia, unspecified: Secondary | ICD-10-CM | POA: Insufficient documentation

## 2020-01-17 DIAGNOSIS — Z79899 Other long term (current) drug therapy: Secondary | ICD-10-CM | POA: Insufficient documentation

## 2020-01-17 DIAGNOSIS — G629 Polyneuropathy, unspecified: Secondary | ICD-10-CM | POA: Insufficient documentation

## 2020-01-17 DIAGNOSIS — Z853 Personal history of malignant neoplasm of breast: Secondary | ICD-10-CM | POA: Insufficient documentation

## 2020-01-17 DIAGNOSIS — Z86718 Personal history of other venous thrombosis and embolism: Secondary | ICD-10-CM | POA: Insufficient documentation

## 2020-01-17 DIAGNOSIS — Z90710 Acquired absence of both cervix and uterus: Secondary | ICD-10-CM | POA: Insufficient documentation

## 2020-01-17 LAB — COMPREHENSIVE METABOLIC PANEL
ALT: 16 U/L (ref 0–44)
AST: 18 U/L (ref 15–41)
Albumin: 3.7 g/dL (ref 3.5–5.0)
Alkaline Phosphatase: 86 U/L (ref 38–126)
Anion gap: 8 (ref 5–15)
BUN: 10 mg/dL (ref 8–23)
CO2: 27 mmol/L (ref 22–32)
Calcium: 9.3 mg/dL (ref 8.9–10.3)
Chloride: 106 mmol/L (ref 98–111)
Creatinine, Ser: 1.02 mg/dL — ABNORMAL HIGH (ref 0.44–1.00)
GFR calc Af Amer: >60 mL/min (ref >60)
GFR calc non Af Amer: 56 mL/min — ABNORMAL LOW (ref >60)
Glucose, Bld: 96 mg/dL (ref 70–99)
Potassium: 3.8 mmol/L (ref 3.5–5.1)
Sodium: 141 mmol/L (ref 135–145)
Total Bilirubin: 0.6 mg/dL (ref 0.3–1.2)
Total Protein: 6.9 g/dL (ref 6.5–8.1)

## 2020-01-17 LAB — CBC WITH DIFFERENTIAL/PLATELET
Abs Immature Granulocytes: 0.03 10*3/uL (ref 0.00–0.07)
Basophils Absolute: 0 10*3/uL (ref 0.0–0.1)
Basophils Relative: 0 %
Eosinophils Absolute: 0.1 10*3/uL (ref 0.0–0.5)
Eosinophils Relative: 2 %
HCT: 31.3 % — ABNORMAL LOW (ref 36.0–46.0)
Hemoglobin: 11.1 g/dL — ABNORMAL LOW (ref 12.0–15.0)
Immature Granulocytes: 0 %
Lymphocytes Relative: 22 %
Lymphs Abs: 1.6 10*3/uL (ref 0.7–4.0)
MCH: 28.2 pg (ref 26.0–34.0)
MCHC: 35.5 g/dL (ref 30.0–36.0)
MCV: 79.4 fL — ABNORMAL LOW (ref 80.0–100.0)
Monocytes Absolute: 0.5 10*3/uL (ref 0.1–1.0)
Monocytes Relative: 7 %
Neutro Abs: 4.8 10*3/uL (ref 1.7–7.7)
Neutrophils Relative %: 69 %
Platelets: 194 10*3/uL (ref 150–400)
RBC: 3.94 MIL/uL (ref 3.87–5.11)
RDW: 14.2 % (ref 11.5–15.5)
WBC: 7 10*3/uL (ref 4.0–10.5)
nRBC: 0 % (ref 0.0–0.2)

## 2020-01-17 MED ORDER — SODIUM CHLORIDE 0.9 % IV SOLN
Freq: Once | INTRAVENOUS | Status: AC
  Filled 2020-01-17: qty 250

## 2020-01-17 MED ORDER — SODIUM CHLORIDE 0.9 % IV SOLN
840.0000 mg | Freq: Once | INTRAVENOUS | Status: AC
  Administered 2020-01-17: 840 mg via INTRAVENOUS
  Filled 2020-01-17: qty 14

## 2020-01-17 MED ORDER — HEPARIN SOD (PORK) LOCK FLUSH 100 UNIT/ML IV SOLN
500.0000 [IU] | Freq: Once | INTRAVENOUS | Status: AC | PRN
  Administered 2020-01-17: 500 [IU]
  Filled 2020-01-17: qty 5

## 2020-01-17 MED ORDER — HEPARIN SOD (PORK) LOCK FLUSH 100 UNIT/ML IV SOLN
INTRAVENOUS | Status: AC
  Filled 2020-01-17: qty 5

## 2020-01-17 MED ORDER — FLUTICASONE PROPIONATE 50 MCG/ACT NA SUSP: 2.0000 | Freq: Every day | NASAL | 2 refills | Status: DC

## 2020-01-17 MED ORDER — SODIUM CHLORIDE 0.9% FLUSH
10.0000 mL | INTRAVENOUS | Status: DC | PRN
  Administered 2020-01-17: 10 mL via INTRAVENOUS
  Filled 2020-01-17: qty 10

## 2020-01-17 NOTE — Research (Signed)
Patient Carrie Alvarez presents to clinic this morning for consideration of her C24D1 Huey Bienenstock only treatment on the [id number] [name] study. Her usual treatment was moved over by 2 days due to the clinic being closed on Monday for the 4th of July Holiday. Patient reports she is only having 1-2 bowel movements per day and denies any diarrhea. States she is still having some numbness and tingling in her fingers, but this has improved and reports the neuropathy in her feet and ankles bilaterally has improved a little as well. Reports the discoloration and tenderness in her hands has now resolved as well. She continues to reports her chronic back pain becomes more intense at times. She is using the wheelchair again today, but states she is ambulating independently at home and going outside almost daily walking down to her mailbox unassisted. She continues to be seen monthly in the pain management clinic for her back pain and pain medication management, and states she will call them this week to see whether she can have another injection in her back because it has been awhile since her last injection. States her hands are better and almost back to normal with much improved color is returning, but states they still feel tender at times and she continues to experience some mild tingling and numbness in fingertips bilaterally. Patient still reporting some ongoing fatigue, but states she now has periods where she actually feels rested at times. She also reports managing all of her self-care needs without assistance. Ms. Bow remains afebrile and SpO2 was measured at 100% at rest, on room air while while wearing a mask. Her appetite remains poor but patient reports she eats at least 3 times daily to make her children happy. Her weight is up a little today. Patient continues to report "a little bit" of abdominal pain, which remains mild, transient, and located to the right her old incision site. Solicited adverse  events were assessed by this RN today, and patient completed the PRO-CTCAE booklet in clinic prior to infusion. Local labs were collected via port-a-cath this morning and CBC and chemistry values were reviewed by Dr. Grayland Ormond. Potassium remains within normal limits at 3.8 mmol/L and patient verifies taking her potassium supplement daily. Serum creatinine is only slightly elevated today at 1.02 mg/dL, and CrCl calculation is 79.11 mL/min. Hgb remains low at 11.1 g/dL this morning. Other labs are WNL for patient to receive her treatment as scheduled today. H&P completed by Dr. Grayland Ormond and patient will proceed with Cycle 24 Atezolizumab as scheduled today. Questions raised about when patient will have her last treatment. Will contact the study chair and determine whether there is a window to be able back the visit up one day in order for Dr. Grayland Ormond to complete her H&P. Dr.Solicited and other Adverse events with grade and attribution are as noted below:   Adverse Event Log  Study/Protocol: [id number] [name] Cycle: Cycle 23 Event Grade Onset Date Resolved Date Drug Name Attribution Treatment Comments  Diarrhea 0      Reports Lomotil worked   Constipation 0      Denies  Nausea 0     unrelated    Fatigue 1 01/01/2020   possible  Reports feeling rested at times  Anorexia 1 07/13/2008   unrelated  States this is her norm   Cough 0        Dyspnea 0        Fever 0        UTI 0  Palmer-plantar erythrodesia 1 03/13/2019 01/17/2020  probable  Reports this is now resolved  Total biliruben 0        Neutrophils 0        TSH 0        Sensory Peripheral Neuropathy 2 07/11/2019     unrelated Gabapentin - dose recently increased  Numbness & Tingling in fingertips, toes & feet bil. from prior Taxol & Oxaliplatin  Anemia 1 01/01/2020     possible  Hgb 11.9 today  Elevated creatinine 1 11/20/2019    unrelated  1.29 mg/dL today  Abdominal pain 1 10/23/2019     unlikely  Still Reports infrequent mild pain    Chronic back & left leg pain 2 07/13/2017   unrelated  Seen monthly at pain management clinic  [facility], BSN, MHA, OCN 01/17/2020 11:24 AM

## 2020-01-17 NOTE — Telephone Encounter (Signed)
Patient is requesting a refill on the following medication  fluticasone (FLONASE) 50 MCG/ACT nasal spray  She would like it sent to [facility].

## 2020-01-17 NOTE — Telephone Encounter (Signed)
Refill sent into the pharmacy 

## 2020-01-18 LAB — THYROID PANEL WITH TSH
Free Thyroxine Index: 1.1 — ABNORMAL LOW (ref 1.2–4.9)
T3 Uptake Ratio: 28 % (ref 24–39)
T4, Total: 3.9 ug/dL — ABNORMAL LOW (ref 4.5–12.0)
TSH: 3.08 u[IU]/mL (ref 0.450–4.500)

## 2020-01-23 ENCOUNTER — Telehealth: Payer: Self-pay | Admitting: *Deleted

## 2020-01-23 NOTE — Telephone Encounter (Signed)
T/C made to patient Carrie Alvarez for purpose of informing her of her next appointment date and time. Instructed patient that she is now scheduled to come for her final Atezolizumab infusion next Wednesday - 01/31/2020 at 8:30 for labs, 9:00 to see Dr. Tasia Catchings and 9:30 for her infusion. Patient is able to correctly repeat this back to me, and will have her appointnent mailed to her as well. Carrie Alvarez asks about her port and how soon she can have this removed in order to avoid another "blood clot". Informed patient that I will talk to Dr. Grayland Ormond when he is back from vacation ans see what he thinks. Patient states she soul like to see Dr. Dahlia Byes again to have the port-a-cath removed. Carrie Alvarez, BSN, MHA, OCN 01/23/2020 11:47 AM

## 2020-01-31 ENCOUNTER — Other Ambulatory Visit: Payer: Self-pay | Admitting: *Deleted

## 2020-01-31 ENCOUNTER — Inpatient Hospital Stay (HOSPITAL_BASED_OUTPATIENT_CLINIC_OR_DEPARTMENT_OTHER): Payer: Medicare HMO | Admitting: Oncology

## 2020-01-31 ENCOUNTER — Inpatient Hospital Stay: Payer: Medicare HMO

## 2020-01-31 ENCOUNTER — Encounter: Payer: Self-pay | Admitting: Oncology

## 2020-01-31 ENCOUNTER — Encounter: Payer: Self-pay | Admitting: *Deleted

## 2020-01-31 ENCOUNTER — Other Ambulatory Visit: Payer: Self-pay

## 2020-01-31 VITALS — BP 123/73 | HR 78 | Temp 95.9°F | Resp 18 | Wt 199.8 lb

## 2020-01-31 DIAGNOSIS — Z15060 Genetic susceptibility to colorectal cancer: Secondary | ICD-10-CM

## 2020-01-31 DIAGNOSIS — Z1509 Genetic susceptibility to other malignant neoplasm: Secondary | ICD-10-CM | POA: Diagnosis not present

## 2020-01-31 DIAGNOSIS — C184 Malignant neoplasm of transverse colon: Secondary | ICD-10-CM

## 2020-01-31 DIAGNOSIS — C773 Secondary and unspecified malignant neoplasm of axilla and upper limb lymph nodes: Secondary | ICD-10-CM

## 2020-01-31 DIAGNOSIS — Z006 Encounter for examination for normal comparison and control in clinical research program: Secondary | ICD-10-CM | POA: Diagnosis not present

## 2020-01-31 DIAGNOSIS — Z95828 Presence of other vascular implants and grafts: Secondary | ICD-10-CM

## 2020-01-31 DIAGNOSIS — C50919 Malignant neoplasm of unspecified site of unspecified female breast: Secondary | ICD-10-CM

## 2020-01-31 LAB — CBC WITH DIFFERENTIAL/PLATELET
Abs Immature Granulocytes: 0.02 10*3/uL (ref 0.00–0.07)
Basophils Absolute: 0 10*3/uL (ref 0.0–0.1)
Basophils Relative: 0 %
Eosinophils Absolute: 0.2 10*3/uL (ref 0.0–0.5)
Eosinophils Relative: 2 %
HCT: 33.5 % — ABNORMAL LOW (ref 36.0–46.0)
Hemoglobin: 11.8 g/dL — ABNORMAL LOW (ref 12.0–15.0)
Immature Granulocytes: 0 %
Lymphocytes Relative: 27 %
Lymphs Abs: 2 10*3/uL (ref 0.7–4.0)
MCH: 28.1 pg (ref 26.0–34.0)
MCHC: 35.2 g/dL (ref 30.0–36.0)
MCV: 79.8 fL — ABNORMAL LOW (ref 80.0–100.0)
Monocytes Absolute: 0.5 10*3/uL (ref 0.1–1.0)
Monocytes Relative: 6 %
Neutro Abs: 4.7 10*3/uL (ref 1.7–7.7)
Neutrophils Relative %: 65 %
Platelets: 219 10*3/uL (ref 150–400)
RBC: 4.2 MIL/uL (ref 3.87–5.11)
RDW: 14.4 % (ref 11.5–15.5)
WBC: 7.4 10*3/uL (ref 4.0–10.5)
nRBC: 0 % (ref 0.0–0.2)

## 2020-01-31 LAB — COMPREHENSIVE METABOLIC PANEL
ALT: 15 U/L (ref 0–44)
AST: 19 U/L (ref 15–41)
Albumin: 3.9 g/dL (ref 3.5–5.0)
Alkaline Phosphatase: 96 U/L (ref 38–126)
Anion gap: 9 (ref 5–15)
BUN: 16 mg/dL (ref 8–23)
CO2: 27 mmol/L (ref 22–32)
Calcium: 9.2 mg/dL (ref 8.9–10.3)
Chloride: 105 mmol/L (ref 98–111)
Creatinine, Ser: 1.24 mg/dL — ABNORMAL HIGH (ref 0.44–1.00)
GFR calc Af Amer: 51 mL/min — ABNORMAL LOW (ref >60)
GFR calc non Af Amer: 44 mL/min — ABNORMAL LOW (ref >60)
Glucose, Bld: 94 mg/dL (ref 70–99)
Potassium: 3.7 mmol/L (ref 3.5–5.1)
Sodium: 141 mmol/L (ref 135–145)
Total Bilirubin: 0.6 mg/dL (ref 0.3–1.2)
Total Protein: 7.2 g/dL (ref 6.5–8.1)

## 2020-01-31 MED ORDER — SODIUM CHLORIDE 0.9 % IV SOLN
Freq: Once | INTRAVENOUS | Status: AC
  Filled 2020-01-31: qty 250

## 2020-01-31 MED ORDER — SODIUM CHLORIDE 0.9% FLUSH
10.0000 mL | INTRAVENOUS | Status: DC | PRN
  Administered 2020-01-31: 10 mL via INTRAVENOUS
  Filled 2020-01-31: qty 10

## 2020-01-31 MED ORDER — SODIUM CHLORIDE 0.9 % IV SOLN
840.0000 mg | Freq: Once | INTRAVENOUS | Status: AC
  Administered 2020-01-31: 840 mg via INTRAVENOUS
  Filled 2020-01-31: qty 14

## 2020-01-31 MED ORDER — HEPARIN SOD (PORK) LOCK FLUSH 100 UNIT/ML IV SOLN
500.0000 [IU] | Freq: Once | INTRAVENOUS | Status: AC | PRN
  Administered 2020-01-31: 500 [IU]
  Filled 2020-01-31: qty 5

## 2020-01-31 NOTE — Research (Signed)
Patient Carrie Alvarez presents to clinic this morning for consideration of her C25D1 Carrie Alvarez only treatment on the [id number] [name] study. Patient reports she is only having 2 bowel movements per day and denies any diarrhea. States she is still having some numbness and tingling in her fingers, but this has improved and reports the neuropathy in her feet and ankles bilaterally has worsened since her last infusion assessment. She continues to reports her chronic and worsening back pain, which becomes more intense with activity. She is using the wheelchair again today, but states she is ambulating independently at home and going outside almost daily walking down to her mailbox unassisted. She continues to be seen monthly in the pain management clinic for her back pain and pain medication management, and is currently waiting to have another injection in her back. Patient still reporting some ongoing fatigue, but states she now has periods where she actually feels rested at times. She also reports managing all of her self-care needs without assistance. Carrie Alvarez remains afebrile and SpO2 was measured at 99% at rest, on room air while while wearing a mask. Her appetite remains poor but patient reports she eats at least 3 times daily. Her weight remains stable. Patient denies any recent abdominal pain today, but is experiencing more back pain and does not have a pain clinic appointment until 02/12/2020. Solicited adverse events were assessed by this RN today, and patient completed the PRO-CTCAE booklet in clinic prior to infusion. Local labs were collected via port-a-cath this morning and CBC and chemistry values were reviewed by Dr. Grayland Ormond. Potassium remains within normal limits at 3.7 mmol/L and patient denies taking her potassium supplement now. Serum creatinine is still elevated today at 1.24 mg/dL, and CrCl calculation is 61.26 mL/min. Hgb remains low at 11.8 g/dL this morning. Other labs are WNL for  patient to receive her treatment as scheduled today. H&P completed by Dr. Tasia Catchings today and patient will proceed with Cycle 25 Atezolizumab as scheduled today. Carrie Alvarez was informed that she will have a CT scan in the next 3-4 weeks and will return to see Dr. Grayland Ormond after this. She will also be referred back to her gastroenterologist, Dr. Allen Norris for a follow up colonoscopy. Spoke with Dr. Grayland Ormond at patient's request earlier this week regarding having her port-a-cath removed. Dr. Grayland Ormond has said that he will not object as long as her CT scan and colonoscopy are normal. Patient was informed of this and is in agreement. Solicited and other Adverse events with grade and attribution are as noted below:   Adverse Event Log  Study/Protocol: [id number] [name] Cycle: Cycle 24 Event Grade Onset Date Resolved Date Drug Name Attribution Treatment Comments  Diarrhea 0        Constipation 0        Nausea 0     unrelated    Fatigue 1 01/01/2020   possible  Reports feeling rested at times  Anorexia 1 07/13/2008   unrelated  States this is her norm   Cough 0        Dyspnea 0        Fever 0        UTI 0        Palmer-plantar erythrodesia 1 03/13/2019 01/17/2020  probable  Reports this is now resolved  Total biliruben 0        Neutrophils 0        TSH 0        Sensory Peripheral Neuropathy 2 07/11/2019  unrelated Gabapentin - dose recently increased  Numbness & Tingling in fingertips, toes & feet bil. from prior Taxol & Oxaliplatin  Anemia 1 01/01/2020     possible    Elevated creatinine 1 11/20/2019    unrelated    Abdominal pain 1 10/23/2019  01/31/2020   unlikely  Reports none recently  Chronic back & left leg pain 2 07/13/2017   unrelated  Next appt. 02/12/20 at pain management clinic;          pain scale 10/10 today  [name], BSN, [place], OCN 01/31/2020 9:30 AM

## 2020-01-31 NOTE — Progress Notes (Signed)
Patient reports chronic back pain is 10/10 today and she does follow up with pain clinic.

## 2020-01-31 NOTE — Progress Notes (Signed)
[place]  [phone] [phone]  ID: Carrie Alvarez OB: 1951/03/14  MR#: [id number]  XIH#:[id number]  Patient Care Team: Paulene Floor as PCP - General (Physician Assistant) [name], MD as Consulting Physician (Oncology) [name], MD as Attending Physician (Pain Medicine) [name]., MD as Consulting Physician (Ophthalmology) [name], RN as Oncology Nurse Navigator Normajean Glasgow, MD as Attending Physician (Physical Medicine and Rehabilitation)  CHIEF COMPLAINT: Stage IIa ER+ left breast cancer with no breast lesion and axillary lymph node metastasis.  Now with stage IIIa colon cancer.  INTERVAL HISTORY:  Patient is oncology care have been with Dr. Grayland Ormond.  I am covering Dr. Grayland Ormond today to see this patient. Patient is here for evaluation prior to her last dose of Tecentriq on clinical trial. She reports no new complaints.  Denies any nausea, vomiting, diarrhea, shortness of breath. She continues to have back and left lower extremity pain.  Chronic weakness and fatigue symptoms are unchanged.  REVIEW OF SYSTEMS:   Review of Systems  Constitutional: Positive for malaise/fatigue. Negative for fever and weight loss.  Respiratory: Negative.  Negative for cough and shortness of breath.   Cardiovascular: Negative.  Negative for chest pain and leg swelling.  Gastrointestinal: Negative.  Negative for abdominal pain, blood in stool, constipation, diarrhea, melena, nausea and vomiting.  Genitourinary: Negative.  Negative for dysuria.  Musculoskeletal: Positive for back pain. Negative for neck pain.  Skin: Negative.  Negative for rash.  Neurological: Positive for tingling, sensory change and weakness. Negative for dizziness, focal weakness and headaches.  Psychiatric/Behavioral: Negative.  Negative for depression. The patient is not nervous/anxious.     As per HPI. Otherwise, a complete review of  systems is negative.  PAST MEDICAL HISTORY: Past Medical History:  Diagnosis Date  . Allergy   . Anemia   . Back pain   . Breast cancer ([place]) 2015   LT LUMPECTOMY 12-2014 FOLLOWING CHEMO  . Carpal tunnel syndrome    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  . Cataract   . Colon cancer ([place]) 01/2019  . Depression   . Family history of breast cancer   . Family history of colon cancer   . GERD (gastroesophageal reflux disease)    problem with reflux during chemo  . History of methicillin resistant staphylococcus aureus (MRSA)   . Hypercholesteremia   . Hypertension   . Lumbosacral pain    sees pain management in [place]  . Obesity   . Personal history of chemotherapy 2016   left breast ca  . Personal history of chemotherapy 2020   colon ca  . Pinched nerve    left side, lumbar area  . Uterine cancer ([place]) 1994    PAST SURGICAL HISTORY: Past Surgical History:  Procedure Laterality Date  . ABDOMINAL HYSTERECTOMY  1994   UTERINE CA  . AXILLARY LYMPH NODE BIOPSY Left 12/18/2014   Procedure: AXILLARY LYMPH NODE BIOPSY/;  Surgeon: Robert Bellow, MD;  Location: [facility];  Service: General;  Laterality: Left;  . AXILLARY LYMPH NODE DISSECTION Left 12/18/2014   Procedure: AXILLARY LYMPH NODE DISSECTION;  Surgeon: Robert Bellow, MD;  Location: [facility];  Service: General;  Laterality: Left;  . BREAST BIOPSY Left 05/2014   CORE BX OF LN, METASTATIC ADENOCARCINOMA  . BREAST LUMPECTOMY Left 12/2014   CHEMO FIRST THEN SURGERY OF LN  . BREAST SURGERY     lymph node removal  . CHOLECYSTECTOMY N/A 04/19/2016  Procedure: LAPAROSCOPIC CHOLECYSTECTOMY WITH INTRAOPERATIVE CHOLANGIOGRAM;  Surgeon: Hubbard Robinson, MD;  Location: [facility];  Service: General;  Laterality: N/A;  . COLON RESECTION Right 01/10/2019   Procedure: HAND ASSISTED LAPAROSCOPIC RIGHT COLON RESECTION;  Surgeon: Jules Husbands, MD;  Location: [facility];  Service: General;  Laterality: Right;  . COLONOSCOPY WITH  PROPOFOL N/A 12/30/2018   Procedure: COLONOSCOPY WITH PROPOFOL;  Surgeon: Lucilla Lame, MD;  Location: [facility];  Service: Endoscopy;  Laterality: N/A;  . medial branch block  11/06/2015   lumbar facet Dr. Primus Bravo  . POLYPECTOMY N/A 12/30/2018   Procedure: POLYPECTOMY;  Surgeon: Lucilla Lame, MD;  Location: [facility];  Service: Endoscopy;  Laterality: N/A;  Clips placed at Hepatic Flexure Polyp (2) and Transverse Colon Polyp (4) removal sites  . PORTACATH PLACEMENT Right 02/02/2019   Procedure: INSERTION PORT-A-CATH;  Surgeon: Jules Husbands, MD;  Location: [facility];  Service: General;  Laterality: Right;  . SENTINEL NODE BIOPSY Left 12/18/2014   Procedure: SENTINEL NODE BIOPSY;  Surgeon: Robert Bellow, MD;  Location: [facility];  Service: General;  Laterality: Left;    FAMILY HISTORY Family History  Problem Relation Age of Onset  . Breast cancer Sister 41  . Colon cancer Sister   . Colon cancer Mother        dx 23s  . Hypertension Mother   . Asthma Mother   . Cancer Father        voice box removed  . Cancer Maternal Grandmother        unsure type  . Colon cancer Cousin   . Cancer Cousin        unsure type       ADVANCED DIRECTIVES:    HEALTH MAINTENANCE: Social History   Tobacco Use  . Smoking status: Former Smoker    Packs/day: 0.50    Types: Cigarettes    Quit date: 07/18/2014    Years since quitting: 5.5  . Smokeless tobacco: Never Used  Vaping Use  . Vaping Use: Never used  Substance Use Topics  . Alcohol use: No    Alcohol/week: 0.0 standard drinks  . Drug use: No    No Known Allergies  Current Outpatient Medications  Medication Sig Dispense Refill  . acetaminophen (TYLENOL) 500 MG tablet Take 500 mg by mouth every 6 (six) hours as needed for mild pain or moderate pain.     Marland Kitchen albuterol (PROVENTIL HFA;VENTOLIN HFA) 108 (90 Base) MCG/ACT inhaler Inhale 2 puffs into the lungs every 6 (six) hours as needed for wheezing or shortness of breath. 1  Inhaler 2  . amLODipine (NORVASC) 5 MG tablet Take 1 tablet (5 mg total) by mouth daily. MD re-ordered at decreased dose 30 tablet 5  . atorvastatin (LIPITOR) 10 MG tablet TAKE 1 TABLET BY MOUTH IN THE MORNING 90 tablet 0  . baclofen (LIORESAL) 10 MG tablet Take 1 tablet (10 mg total) by mouth 2 (two) times daily. 30 each 0  . cholecalciferol (VITAMIN D) 1000 units tablet Take 1,000 Units by mouth daily.    . diphenoxylate-atropine (LOMOTIL) 2.5-0.025 MG tablet Take 1 tablet by mouth 4 (four) times daily as needed for diarrhea or loose stools. 30 tablet 2  . fluticasone (FLONASE) 50 MCG/ACT nasal spray Place 2 sprays into both nostrils daily. 16 g 2  . gabapentin (NEURONTIN) 300 MG capsule Take 300-600 mg by mouth 4 (four) times daily as needed (neuropathic pain).     Marland Kitchen letrozole (FEMARA) 2.5 MG  tablet Take 2.5 mg by mouth daily.    Marland Kitchen lidocaine-prilocaine (EMLA) cream Apply to affected area once 30 g 3  . NARCAN 4 MG/0.1ML LIQD nasal spray kit Place 1 spray into the nose.    Marland Kitchen Oxycodone HCl 10 MG TABS LIMIT ONE HALF TO ONE TABLET BY MOUTH 3 TO 5 TIMES DAILY IF TOLERATED    . potassium chloride SA (KLOR-CON) 20 MEQ tablet Take 1 tablet (20 mEq total) by mouth 2 (two) times daily. 60 tablet 1  . pyridoxine (B-6) 100 MG tablet Take 100 mg by mouth daily.    . sertraline (ZOLOFT) 100 MG tablet Take 2 tablets by mouth once daily 180 tablet 1  . vitamin B-12 (CYANOCOBALAMIN) 500 MCG tablet Take 500 mcg by mouth daily.     No current facility-administered medications for this visit.   Facility-Administered Medications Ordered in Other Visits  Medication Dose Route Frequency Provider Last Rate Last Admin  . heparin lock flush 100 unit/mL  500 Units Intravenous Once [name], MD      . heparin lock flush 100 unit/mL  500 Units Intravenous Once [name], MD      . sodium chloride flush (NS) 0.9 % injection 10 mL  10 mL Intravenous PRN [name], MD   10 mL at 02/27/19  0925    OBJECTIVE: Vitals:   01/31/20 0856  BP: 123/73  Pulse: 78  Resp: 18  Temp: (!) 95.9 F (35.5 C)  SpO2: 99%     Body mass index is 35.39 kg/m.    ECOG FS:1 - Symptomatic but completely ambulatory  General: Well-developed, obese, no acute distress.  Patient sits in a wheelchair.   Eyes: Pink conjunctiva, anicteric sclera. HEENT: Normocephalic, moist mucous membranes. Lungs: No audible wheezing or coughing. Heart: Regular rate and rhythm. Abdomen: Soft, nontender, no obvious distention. Musculoskeletal: No edema, cyanosis, or clubbing. Neuro: Alert, answering all questions appropriately. Cranial nerves grossly intact. Skin: No rashes or petechiae noted. Psych: Normal affect.   LAB RESULTS:  Lab Results  Component Value Date   NA 141 01/17/2020   K 3.8 01/17/2020   CL 106 01/17/2020   CO2 27 01/17/2020   GLUCOSE 96 01/17/2020   BUN 10 01/17/2020   CREATININE 1.02 (H) 01/17/2020   CALCIUM 9.3 01/17/2020   PROT 6.9 01/17/2020   ALBUMIN 3.7 01/17/2020   AST 18 01/17/2020   ALT 16 01/17/2020   ALKPHOS 86 01/17/2020   BILITOT 0.6 01/17/2020   GFRNONAA 56 (L) 01/17/2020   GFRAA >60 01/17/2020    Lab Results  Component Value Date   WBC 7.4 01/31/2020   NEUTROABS 4.7 01/31/2020   HGB 11.8 (L) 01/31/2020   HCT 33.5 (L) 01/31/2020   MCV 79.8 (L) 01/31/2020   PLT 219 01/31/2020     STUDIES: No results found.  ASSESSMENT: Stage IIA ER+ left breast cancer with no breast lesion and axillary lymph node metastasis. (TxN1M0)  HER-2 negative.  Now with stage IIIa colon cancer.  PLAN:   1.  Stage IIIa colon cancer:  Completed 12 cycles of chemotherapy on July 31, 2019. She has also been on maintenance Tecentriq and today is her last dose. Overall she tolerates well.  Labs are reviewed and discussed with patient. Proceed with Tecentriq treatment today. Patient is on clinical trial and nurse navigator will arrange patient to have a CT scan approximately 4  weeks after today, as well as a colonoscopy.  Patient will see Dr. Grayland Ormond after  her CT scan.   2. Stage IIa ER+ left breast cancer with no breast lesion and axillary lymph node metastasis: Despite no obvious breast lesion on mammogram or breast MRI, pathology and pattern of spread was consistent with primary breast cancer. CT and bone scan at time of diagnosis revealed no other evidence of malignancy.  She only received 3 cycles of Adriamycin and Cytoxan. Taxol was discontinued early as well secondary to persistent peripheral neuropathy. Patient completed her chemotherapy on Nov 19, 2014.  She elected not to pursue axillary node dissection given the potential morbidity of this procedure. It was also elected not to pursue adjuvant XRT given there was no primary breast lesion.  Patient completed approximately 4 years of treatment with letrozole, but this was discontinued secondary to enrolling in clinical trial for her newly diagnosed colon cancer.  Patient is due for repeat mammogram in December 2021  3.  Bone health: Patient's most recent bone mineral density on February 03, 2018 reported T score of -0.7 which is unchanged from 3 years prior.  Recommend bone mineral density in July 2021.  We will wait and to after her CT scan.  Dr. Grayland Ormond to arrange.  4.  Lynch syndrome: Genetic testing confirmed patient has Lynch syndrome.  She reports her sister and son both have tested positive.  She plans to talk to the rest of her children about genetic testing.  Appreciate genetics input.  5.  Endometrial cancer: Patient has had a total hysterectomy.  6.  Back pain/sciatica: Chronic and unchanged.  Follow-up with neurology and pain clinic. 7.  Peripheral neuropathy: Grade 3.  Patient is now taking gabapentin 600 mg 4 times per day.  Continue follow-up with pain clinic as scheduled.  8.  Anemia: Mild microcytic anemia, hemoglobin stable at 11.8. 9.  Renal insufficiency: Chronic.  Creatinine is 1.24 today.  Avoid  nephrotoxins.  Encourage oral hydration. 10.  Port associated clot: Patient has now completed greater than 3 months of Eliquis and has discontinued treatment.  Patient has expressed interest in removing her port once treatment is completed.  Patient expressed understanding and was in agreement with this plan. She also understands that She can call clinic at any time with any questions, concerns, or complaints.  Follow-up with Dr. Grayland Ormond in 4 weeks, after CT scan.  Plan was discussed with clinical trial nurse navigator  Earlie Server, MD   01/31/2020 8:46 AM

## 2020-02-02 ENCOUNTER — Telehealth: Payer: Self-pay | Admitting: Oncology

## 2020-02-02 NOTE — Addendum Note (Signed)
Addended by: Peggye Fothergill on: 02/02/2020 03:47 PM   Modules accepted: Orders

## 2020-02-02 NOTE — Telephone Encounter (Signed)
Left VM that CT and follow-up with Dr. Grayland Ormond had been scheduled. Instructed to contact office for appt details. Also mailed AVS to home.

## 2020-02-07 DIAGNOSIS — M13861 Other specified arthritis, right knee: Secondary | ICD-10-CM | POA: Diagnosis not present

## 2020-02-07 DIAGNOSIS — M5136 Other intervertebral disc degeneration, lumbar region: Secondary | ICD-10-CM | POA: Diagnosis not present

## 2020-02-07 DIAGNOSIS — M19011 Primary osteoarthritis, right shoulder: Secondary | ICD-10-CM | POA: Diagnosis not present

## 2020-02-07 DIAGNOSIS — M7062 Trochanteric bursitis, left hip: Secondary | ICD-10-CM | POA: Diagnosis not present

## 2020-02-09 ENCOUNTER — Other Ambulatory Visit: Payer: Self-pay

## 2020-02-09 ENCOUNTER — Telehealth (INDEPENDENT_AMBULATORY_CARE_PROVIDER_SITE_OTHER): Payer: Self-pay | Admitting: Gastroenterology

## 2020-02-09 DIAGNOSIS — Z800 Family history of malignant neoplasm of digestive organs: Secondary | ICD-10-CM

## 2020-02-09 DIAGNOSIS — C184 Malignant neoplasm of transverse colon: Secondary | ICD-10-CM

## 2020-02-09 DIAGNOSIS — Z1211 Encounter for screening for malignant neoplasm of colon: Secondary | ICD-10-CM

## 2020-02-09 NOTE — Progress Notes (Signed)
Gastroenterology Pre-Procedure Review  Request Date: Tuesday 03/05/20 Requesting Physician: Dr. Allen Norris  PATIENT REVIEW QUESTIONS: The patient responded to the following health history questions as indicated:    1. Are you having any GI issues? no 2. Do you have a personal history of Polyps? yes (Colon Polyps noted by Dr. Allen Norris 12/30/18) 3. Do you have a family history of Colon Cancer or Polyps? yes (mother colon cancer) 4. Diabetes Mellitus? no 5. Joint replacements in the past 12 months?no 6. Major health problems in the past 3 months?no 7. Any artificial heart valves, MVP, or defibrillator?no    MEDICATIONS & ALLERGIES:    Patient reports the following regarding taking any anticoagulation/antiplatelet therapy:   Plavix, Coumadin, Eliquis, Xarelto, Lovenox, Pradaxa, Brilinta, or Effient? no Aspirin? no  Patient confirms/reports the following medications:  Current Outpatient Medications  Medication Sig Dispense Refill  . acetaminophen (TYLENOL) 500 MG tablet Take 500 mg by mouth every 6 (six) hours as needed for mild pain or moderate pain.     Marland Kitchen albuterol (PROVENTIL HFA;VENTOLIN HFA) 108 (90 Base) MCG/ACT inhaler Inhale 2 puffs into the lungs every 6 (six) hours as needed for wheezing or shortness of breath. 1 Inhaler 2  . amLODipine (NORVASC) 5 MG tablet Take 1 tablet (5 mg total) by mouth daily. MD re-ordered at decreased dose 30 tablet 5  . atorvastatin (LIPITOR) 10 MG tablet TAKE 1 TABLET BY MOUTH IN THE MORNING 90 tablet 0  . baclofen (LIORESAL) 10 MG tablet Take 1 tablet (10 mg total) by mouth 2 (two) times daily. 30 each 0  . cholecalciferol (VITAMIN D) 1000 units tablet Take 1,000 Units by mouth daily.    . fluticasone (FLONASE) 50 MCG/ACT nasal spray Place 2 sprays into both nostrils daily. 16 g 2  . gabapentin (NEURONTIN) 300 MG capsule Take 300-600 mg by mouth 4 (four) times daily as needed (neuropathic pain).     Marland Kitchen lidocaine-prilocaine (EMLA) cream Apply to affected area once  30 g 3  . pyridoxine (B-6) 100 MG tablet Take 100 mg by mouth daily.    . sertraline (ZOLOFT) 100 MG tablet Take 2 tablets by mouth once daily 180 tablet 1  . vitamin B-12 (CYANOCOBALAMIN) 500 MCG tablet Take 500 mcg by mouth daily.    . diphenoxylate-atropine (LOMOTIL) 2.5-0.025 MG tablet Take 1 tablet by mouth 4 (four) times daily as needed for diarrhea or loose stools. (Patient not taking: Reported on 01/31/2020) 30 tablet 2  . letrozole (FEMARA) 2.5 MG tablet Take 2.5 mg by mouth daily. (Patient not taking: Reported on 01/31/2020)    . NARCAN 4 MG/0.1ML LIQD nasal spray kit Place 1 spray into the nose. (Patient not taking: Reported on 01/31/2020)    . Oxycodone HCl 10 MG TABS LIMIT ONE HALF TO ONE TABLET BY MOUTH 3 TO 5 TIMES DAILY IF TOLERATED    . potassium chloride SA (KLOR-CON) 20 MEQ tablet Take 1 tablet (20 mEq total) by mouth 2 (two) times daily. (Patient not taking: Reported on 01/31/2020) 60 tablet 1   No current facility-administered medications for this visit.   Facility-Administered Medications Ordered in Other Visits  Medication Dose Route Frequency Provider Last Rate Last Admin  . heparin lock flush 100 unit/mL  500 Units Intravenous Once [name], MD      . heparin lock flush 100 unit/mL  500 Units Intravenous Once [name], MD      . sodium chloride flush (NS) 0.9 % injection 10 mL  10 mL  Intravenous PRN [name], MD   10 mL at 02/27/19 6333    Patient confirms/reports the following allergies:  No Known Allergies  No orders of the defined types were placed in this encounter.   AUTHORIZATION INFORMATION Primary Insurance: 1D#: Group #:  Secondary Insurance: 1D#: Group #:  SCHEDULE INFORMATION: Date: Tuesday 0/24/21 Time: Location:[facility]

## 2020-02-12 DIAGNOSIS — G894 Chronic pain syndrome: Secondary | ICD-10-CM | POA: Diagnosis not present

## 2020-02-12 DIAGNOSIS — M5416 Radiculopathy, lumbar region: Secondary | ICD-10-CM | POA: Diagnosis not present

## 2020-02-20 ENCOUNTER — Ambulatory Visit
Admission: RE | Admit: 2020-02-20 | Discharge: 2020-02-20 | Disposition: A | Discharge status: Home / Self Care | Payer: Medicare HMO | Source: Ambulatory Visit | Attending: Oncology | Admitting: Oncology

## 2020-02-20 ENCOUNTER — Other Ambulatory Visit: Payer: Self-pay

## 2020-02-20 DIAGNOSIS — C184 Malignant neoplasm of transverse colon: Secondary | ICD-10-CM | POA: Insufficient documentation

## 2020-02-20 DIAGNOSIS — C189 Malignant neoplasm of colon, unspecified: Secondary | ICD-10-CM | POA: Diagnosis not present

## 2020-02-20 DIAGNOSIS — K7689 Other specified diseases of liver: Secondary | ICD-10-CM | POA: Diagnosis not present

## 2020-02-20 DIAGNOSIS — R911 Solitary pulmonary nodule: Secondary | ICD-10-CM | POA: Diagnosis not present

## 2020-02-20 DIAGNOSIS — K6389 Other specified diseases of intestine: Secondary | ICD-10-CM | POA: Diagnosis not present

## 2020-02-20 MED ORDER — IOHEXOL 300 MG/ML  SOLN
80.0000 mL | Freq: Once | INTRAMUSCULAR | Status: AC | PRN
  Administered 2020-02-20: 80 mL via INTRAVENOUS

## 2020-02-23 NOTE — Progress Notes (Signed)
Carrie Alvarez  [phone] [phone]  ID: Norm Salt OB: Apr 13, 1951  MR#: [id number]  YKD#:[id number]  Patient Care Team: Paulene Floor as PCP - General (Physician Assistant) [name], MD as Consulting Physician (Oncology) [name], MD as Attending Physician (Pain Medicine) [name]., MD as Consulting Physician (Ophthalmology) [name], RN as Oncology Nurse Navigator Normajean Glasgow, MD as Attending Physician (Physical Medicine and Rehabilitation)  CHIEF COMPLAINT: Stage IIa ER+ left breast cancer with no breast lesion and axillary lymph node metastasis.  Now with stage IIIa colon cancer.  INTERVAL HISTORY: Patient returns to clinic today for further evaluation and discussion of her imaging results.  She continues to have back and leg pain who is scheduled for injection in the next several weeks.  She is also noted a lesion on her left calf that is growing in size.  She otherwise feels well. She continues to have chronic weakness and fatigue. She denies any recent fevers or illnesses.  She has no chest pain, shortness of breath, cough, or hemoptysis.  She denies any nausea, vomiting, constipation, or diarrhea. She has no melena or hematochezia.  She has no urinary complaints.  Patient offers no further specific complaints today.  REVIEW OF SYSTEMS:   Review of Systems  Constitutional: Positive for malaise/fatigue. Negative for fever and weight loss.  Respiratory: Negative.  Negative for cough and shortness of breath.   Cardiovascular: Negative.  Negative for chest pain and leg swelling.  Gastrointestinal: Negative.  Negative for abdominal pain, blood in stool, constipation, diarrhea, melena, nausea and vomiting.  Genitourinary: Negative.  Negative for dysuria.  Musculoskeletal: Positive for back pain. Negative for neck pain.  Skin: Negative.  Negative for rash.  Neurological: Positive for tingling,  sensory change and weakness. Negative for dizziness, focal weakness and headaches.  Psychiatric/Behavioral: Negative.  Negative for depression. The patient is not nervous/anxious.     As per HPI. Otherwise, a complete review of systems is negative.  PAST MEDICAL HISTORY: Past Medical History:  Diagnosis Date  . Allergy   . Anemia   . Back pain   . Breast cancer ([place]) 2015   LT LUMPECTOMY 12-2014 FOLLOWING CHEMO  . Carpal tunnel syndrome    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  . Cataract   . Colon cancer ([place]) 01/2019  . Depression   . Family history of breast cancer   . Family history of colon cancer   . GERD (gastroesophageal reflux disease)    problem with reflux during chemo  . History of methicillin resistant staphylococcus aureus (MRSA)   . Hypercholesteremia   . Hypertension   . Lumbosacral pain    sees pain management in [place]  . Obesity   . Personal history of chemotherapy 2016   left breast ca  . Personal history of chemotherapy 2020   colon ca  . Pinched nerve    left side, lumbar area  . Uterine cancer ([place]) 1994    PAST SURGICAL HISTORY: Past Surgical History:  Procedure Laterality Date  . ABDOMINAL HYSTERECTOMY  1994   UTERINE CA  . AXILLARY LYMPH NODE BIOPSY Left 12/18/2014   Procedure: AXILLARY LYMPH NODE BIOPSY/;  Surgeon: Robert Bellow, MD;  Location: [facility];  Service: General;  Laterality: Left;  . AXILLARY LYMPH NODE DISSECTION Left 12/18/2014   Procedure: AXILLARY LYMPH NODE DISSECTION;  Surgeon: Robert Bellow, MD;  Location: [facility];  Service: General;  Laterality: Left;  . BREAST  BIOPSY Left 05/2014   CORE BX OF LN, METASTATIC ADENOCARCINOMA  . BREAST LUMPECTOMY Left 12/2014   CHEMO FIRST THEN SURGERY OF LN  . BREAST SURGERY     lymph node removal  . CHOLECYSTECTOMY N/A 04/19/2016   Procedure: LAPAROSCOPIC CHOLECYSTECTOMY WITH INTRAOPERATIVE CHOLANGIOGRAM;  Surgeon: Hubbard Robinson, MD;  Location: [facility];  Service: General;   Laterality: N/A;  . COLON RESECTION Right 01/10/2019   Procedure: HAND ASSISTED LAPAROSCOPIC RIGHT COLON RESECTION;  Surgeon: Jules Husbands, MD;  Location: [facility];  Service: General;  Laterality: Right;  . COLONOSCOPY WITH PROPOFOL N/A 12/30/2018   Procedure: COLONOSCOPY WITH PROPOFOL;  Surgeon: Lucilla Lame, MD;  Location: [facility];  Service: Endoscopy;  Laterality: N/A;  . medial branch block  11/06/2015   lumbar facet Dr. Primus Bravo  . POLYPECTOMY N/A 12/30/2018   Procedure: POLYPECTOMY;  Surgeon: Lucilla Lame, MD;  Location: [facility];  Service: Endoscopy;  Laterality: N/A;  Clips placed at Hepatic Flexure Polyp (2) and Transverse Colon Polyp (4) removal sites  . PORTACATH PLACEMENT Right 02/02/2019   Procedure: INSERTION PORT-A-CATH;  Surgeon: Jules Husbands, MD;  Location: [facility];  Service: General;  Laterality: Right;  . SENTINEL NODE BIOPSY Left 12/18/2014   Procedure: SENTINEL NODE BIOPSY;  Surgeon: Robert Bellow, MD;  Location: [facility];  Service: General;  Laterality: Left;    FAMILY HISTORY Family History  Problem Relation Age of Onset  . Breast cancer Sister 30  . Colon cancer Sister   . Colon cancer Mother        dx 76s  . Hypertension Mother   . Asthma Mother   . Cancer Father        voice box removed  . Cancer Maternal Grandmother        unsure type  . Colon cancer Cousin   . Cancer Cousin        unsure type       ADVANCED DIRECTIVES:    HEALTH MAINTENANCE: Social History   Tobacco Use  . Smoking status: Former Smoker    Packs/day: 0.50    Types: Cigarettes    Quit date: 07/18/2014    Years since quitting: 5.6  . Smokeless tobacco: Never Used  Vaping Use  . Vaping Use: Never used  Substance Use Topics  . Alcohol use: No    Alcohol/week: 0.0 standard drinks  . Drug use: No    No Known Allergies  Current Outpatient Medications  Medication Sig Dispense Refill  . acetaminophen (TYLENOL) 500 MG tablet Take 500 mg by mouth every 6  (six) hours as needed for mild pain or moderate pain.     Marland Kitchen albuterol (PROVENTIL HFA;VENTOLIN HFA) 108 (90 Base) MCG/ACT inhaler Inhale 2 puffs into the lungs every 6 (six) hours as needed for wheezing or shortness of breath. 1 Inhaler 2  . amLODipine (NORVASC) 5 MG tablet Take 1 tablet (5 mg total) by mouth daily. MD re-ordered at decreased dose 30 tablet 5  . atorvastatin (LIPITOR) 10 MG tablet TAKE 1 TABLET BY MOUTH IN THE MORNING 90 tablet 0  . baclofen (LIORESAL) 10 MG tablet Take 1 tablet (10 mg total) by mouth 2 (two) times daily. 30 each 0  . cholecalciferol (VITAMIN D) 1000 units tablet Take 1,000 Units by mouth daily.    . diphenoxylate-atropine (LOMOTIL) 2.5-0.025 MG tablet Take 1 tablet by mouth 4 (four) times daily as needed for diarrhea or loose stools. 30 tablet 2  . fluticasone (FLONASE)  50 MCG/ACT nasal spray Place 2 sprays into both nostrils daily. 16 g 2  . gabapentin (NEURONTIN) 300 MG capsule Take 300-600 mg by mouth 4 (four) times daily as needed (neuropathic pain).     Marland Kitchen letrozole (FEMARA) 2.5 MG tablet Take 2.5 mg by mouth daily.     Marland Kitchen lidocaine-prilocaine (EMLA) cream Apply to affected area once 30 g 3  . NARCAN 4 MG/0.1ML LIQD nasal spray kit Place 1 spray into the nose.     Marland Kitchen Oxycodone HCl 10 MG TABS LIMIT ONE HALF TO ONE TABLET BY MOUTH 3 TO 5 TIMES DAILY IF TOLERATED    . potassium chloride SA (KLOR-CON) 20 MEQ tablet Take 1 tablet (20 mEq total) by mouth 2 (two) times daily. 60 tablet 1  . pyridoxine (B-6) 100 MG tablet Take 100 mg by mouth daily.    . sertraline (ZOLOFT) 100 MG tablet Take 2 tablets by mouth once daily 180 tablet 1  . vitamin B-12 (CYANOCOBALAMIN) 500 MCG tablet Take 500 mcg by mouth daily.     No current facility-administered medications for this visit.   Facility-Administered Medications Ordered in Other Visits  Medication Dose Route Frequency Provider Last Rate Last Admin  . heparin lock flush 100 unit/mL  500 Units Intravenous Once [name], MD      . heparin lock flush 100 unit/mL  500 Units Intravenous Once [name], MD      . sodium chloride flush (NS) 0.9 % injection 10 mL  10 mL Intravenous PRN [name], MD   10 mL at 02/27/19 0925    OBJECTIVE: Vitals:   02/26/20 1015  BP: 107/89  Pulse: 73  Resp: 20  Temp: (!) 97.3 F (36.3 C)  SpO2: 100%     Body mass index is 35.52 kg/m.    ECOG FS:1 - Symptomatic but completely ambulatory  General: Well-developed, well-nourished, no acute distress. Eyes: Pink conjunctiva, anicteric sclera. HEENT: Normocephalic, moist mucous membranes. Lungs: No audible wheezing or coughing. Heart: Regular rate and rhythm. Abdomen: Soft, nontender, no obvious distention. Musculoskeletal: No edema, cyanosis, or clubbing. Neuro: Alert, answering all questions appropriately. Cranial nerves grossly intact. Skin: No rashes or petechiae noted.  Subcentimeter dark lesion noted on patient's calf. Psych: Normal affect.   LAB RESULTS:  Lab Results  Component Value Date   NA 143 02/26/2020   K 3.9 02/26/2020   CL 109 02/26/2020   CO2 27 02/26/2020   GLUCOSE 93 02/26/2020   BUN 13 02/26/2020   CREATININE 1.13 (H) 02/26/2020   CALCIUM 9.4 02/26/2020   PROT 7.6 02/26/2020   ALBUMIN 4.1 02/26/2020   AST 19 02/26/2020   ALT 17 02/26/2020   ALKPHOS 83 02/26/2020   BILITOT 0.8 02/26/2020   GFRNONAA 50 (L) 02/26/2020   GFRAA 57 (L) 02/26/2020    Lab Results  Component Value Date   WBC 8.0 02/26/2020   NEUTROABS 6.0 02/26/2020   HGB 12.6 02/26/2020   HCT 35.2 (L) 02/26/2020   MCV 79.3 (L) 02/26/2020   PLT 229 02/26/2020     STUDIES: CT Chest W Contrast  Result Date: 02/20/2020 CLINICAL DATA:  Colon cancer diagnosed July 2020 status post surgical resection and chemotherapy. Restaging. Remote history of uterine cancer status post hysterectomy. EXAM: CT CHEST, ABDOMEN, AND PELVIS WITH CONTRAST TECHNIQUE: Multidetector CT imaging of the chest, abdomen and  pelvis was performed following the standard protocol during bolus administration of intravenous contrast. CONTRAST:  20m OMNIPAQUE IOHEXOL 300 MG/ML  SOLN COMPARISON:  08/07/2019 CT chest, abdomen and pelvis. FINDINGS: CT CHEST FINDINGS Cardiovascular: Normal heart size. No significant pericardial effusion/thickening. Atherosclerotic thoracic aorta with ectatic 4.1 cm ascending thoracic aorta, stable. Normal caliber pulmonary arteries. Right internal jugular Port-A-Cath terminates in middle third of the SVC. Previously described thrombus in the right internal jugular vein surrounding the Port-A-Cath has resolved. No central pulmonary emboli. Mediastinum/Nodes: No discrete thyroid nodules. Unremarkable esophagus. No pathologically enlarged axillary, mediastinal or hilar lymph nodes. Lungs/Pleura: No pneumothorax. No pleural effusion. No acute consolidative airspace disease or lung masses. Tiny peripheral left upper lobe 2 mm pulmonary nodule (series 3/image 81), stable. No new significant pulmonary nodules. Stable calcified subcentimeter anterior left upper lobe granulomas. Musculoskeletal: No aggressive appearing focal osseous lesions. Mild thoracic spondylosis. CT ABDOMEN PELVIS FINDINGS Hepatobiliary: Normal liver size. Sub 5 mm hypodense posterior right liver dome lesion is too small to characterize and unchanged. No new liver lesions. Cholecystectomy. No biliary ductal dilatation. Pancreas: Normal, with no mass or duct dilation. Spleen: Normal size. No mass. Adrenals/Urinary Tract: Normal adrenals. Normal kidneys with no hydronephrosis and no renal mass. Normal bladder. Stomach/Bowel: Normal non-distended stomach. Stable postsurgical changes from right hemicolectomy with intact appearing ileocolic anastomosis in the right abdomen. No dilated or thick-walled small bowel loops. Oral contrast transits to the rectum. No large bowel wall thickening, diverticulosis or acute pericolonic stranding in the remnant  large-bowel. Vascular/Lymphatic: Atherosclerotic nonaneurysmal abdominal aorta. Patent portal, splenic, hepatic and renal veins. No pathologically enlarged lymph nodes in the abdomen or pelvis. Reproductive: Status post hysterectomy, with no abnormal findings at the vaginal cuff. No adnexal mass. Other: No pneumoperitoneum, ascites or focal fluid collection. Centrally fatty 0.8 cm peripherally soft tissue density lesion in left paracolic gutter (series 2/image 66), decreased from 1.7 cm favoring resolving fat necrosis. Musculoskeletal: No aggressive appearing focal osseous lesions. Mild lumbar spondylosis. IMPRESSION: 1. No findings suspicious for metastatic disease in the chest, abdomen or pelvis. 2. Right hemicolectomy. Small centrally fatty lesion in the left paracolic gutter is decreased, favoring resolving fat necrosis. Continued CT surveillance advised. 3.  Aortic Atherosclerosis (ICD10-I70.0). Electronically Signed   By: Ilona Sorrel M.D.   On: 02/20/2020 11:01   CT Abdomen Pelvis W Contrast  Result Date: 02/20/2020 CLINICAL DATA:  Colon cancer diagnosed July 2020 status post surgical resection and chemotherapy. Restaging. Remote history of uterine cancer status post hysterectomy. EXAM: CT CHEST, ABDOMEN, AND PELVIS WITH CONTRAST TECHNIQUE: Multidetector CT imaging of the chest, abdomen and pelvis was performed following the standard protocol during bolus administration of intravenous contrast. CONTRAST:  89m OMNIPAQUE IOHEXOL 300 MG/ML  SOLN COMPARISON:  08/07/2019 CT chest, abdomen and pelvis. FINDINGS: CT CHEST FINDINGS Cardiovascular: Normal heart size. No significant pericardial effusion/thickening. Atherosclerotic thoracic aorta with ectatic 4.1 cm ascending thoracic aorta, stable. Normal caliber pulmonary arteries. Right internal jugular Port-A-Cath terminates in middle third of the SVC. Previously described thrombus in the right internal jugular vein surrounding the Port-A-Cath has resolved. No  central pulmonary emboli. Mediastinum/Nodes: No discrete thyroid nodules. Unremarkable esophagus. No pathologically enlarged axillary, mediastinal or hilar lymph nodes. Lungs/Pleura: No pneumothorax. No pleural effusion. No acute consolidative airspace disease or lung masses. Tiny peripheral left upper lobe 2 mm pulmonary nodule (series 3/image 81), stable. No new significant pulmonary nodules. Stable calcified subcentimeter anterior left upper lobe granulomas. Musculoskeletal: No aggressive appearing focal osseous lesions. Mild thoracic spondylosis. CT ABDOMEN PELVIS FINDINGS Hepatobiliary: Normal liver size. Sub 5 mm hypodense posterior right liver dome lesion is too small to characterize and  unchanged. No new liver lesions. Cholecystectomy. No biliary ductal dilatation. Pancreas: Normal, with no mass or duct dilation. Spleen: Normal size. No mass. Adrenals/Urinary Tract: Normal adrenals. Normal kidneys with no hydronephrosis and no renal mass. Normal bladder. Stomach/Bowel: Normal non-distended stomach. Stable postsurgical changes from right hemicolectomy with intact appearing ileocolic anastomosis in the right abdomen. No dilated or thick-walled small bowel loops. Oral contrast transits to the rectum. No large bowel wall thickening, diverticulosis or acute pericolonic stranding in the remnant large-bowel. Vascular/Lymphatic: Atherosclerotic nonaneurysmal abdominal aorta. Patent portal, splenic, hepatic and renal veins. No pathologically enlarged lymph nodes in the abdomen or pelvis. Reproductive: Status post hysterectomy, with no abnormal findings at the vaginal cuff. No adnexal mass. Other: No pneumoperitoneum, ascites or focal fluid collection. Centrally fatty 0.8 cm peripherally soft tissue density lesion in left paracolic gutter (series 2/image 66), decreased from 1.7 cm favoring resolving fat necrosis. Musculoskeletal: No aggressive appearing focal osseous lesions. Mild lumbar spondylosis. IMPRESSION: 1. No  findings suspicious for metastatic disease in the chest, abdomen or pelvis. 2. Right hemicolectomy. Small centrally fatty lesion in the left paracolic gutter is decreased, favoring resolving fat necrosis. Continued CT surveillance advised. 3.  Aortic Atherosclerosis (ICD10-I70.0). Electronically Signed   By: Ilona Sorrel M.D.   On: 02/20/2020 11:01    ASSESSMENT: Stage IIa ER+ left breast cancer with no breast lesion and axillary lymph node metastasis. (TxN1M0)  HER-2 negative.  Now with stage IIIa colon cancer.  PLAN:   1.  Stage IIIa colon cancer: Pathology report reviewed independently.  Previously, patient agreed to enroll in clinical trial.  Oxaliplatin was discontinued early secondary to worsening neuropathy.  Patient completed 12 cycles of chemotherapy on July 31, 2019 and then completed maintenance Tecentriq on January 29, 2020.  Restaging CT scan on February 20, 2020 reviewed independently and reported as above with no obvious evidence of recurrent or metastatic disease.  She has a colonoscopy scheduled in the next several weeks.  No further interventions are needed.  Return to clinic in 3 months with repeat laboratory work and further evaluation.   2. Stage IIa ER+ left breast cancer with no breast lesion and axillary lymph node metastasis: Despite no obvious breast lesion on mammogram or breast MRI, pathology and pattern of spread was consistent with primary breast cancer. CT and bone scan at time of diagnosis revealed no other evidence of malignancy.  She only received 3 cycles of Adriamycin and Cytoxan. Taxol was discontinued early as well secondary to persistent peripheral neuropathy. Patient completed her chemotherapy on Nov 19, 2014.  She elected not to pursue axillary node dissection given the potential morbidity of this procedure. It was also elected not to pursue adjuvant XRT given there was no primary breast lesion.  Patient completed approximately 4 years of treatment with letrozole, but  this was discontinued secondary to enrolling in clinical trial for her newly diagnosed colon cancer.  Her most recent mammogram on July 04, 2019 was reported as BI-RADS 1.  Repeat in December 2021.  3.  Bone health: Patient's most recent bone mineral density on February 03, 2018 reported T score of -0.7 which is unchanged from 3 years prior.  Consider repeat bone mineral density in July 2021.   4.  Lynch syndrome: Genetic testing confirmed patient has Lynch syndrome.  She reports her sister and son both have tested positive.  She plans to talk to the rest of her children about genetic testing.  Appreciate genetics input.  5.  Endometrial cancer: Patient has  had a total hysterectomy.  6.  Back pain/sciatica: Chronic and unchanged.  Continue monthly follow-up with pain clinic as scheduled.  Patient reports insurance will only allow 3 injections every 6 months.  Patient has an injection scheduled in the next several weeks. 7.  Peripheral neuropathy: Grade 3.  Patient is now taking gabapentin 600 mg 4 times per day.  Continue follow-up with pain clinic as scheduled.  8.  Anemia: Resolved. 9.  Renal insufficiency: Resolved. 10.  Hypokalemia: Resolved.  Continue oral potassium supplementation.  11.  Diarrhea: Resolved.  Continue Imodium and Lomotil as needed. 12.  Fatigue: Chronic and unchanged.   13.  Hypotension: Resolved.  Continue Norvasc 5 mg daily. 14.  Port associated clot: Patient has now completed greater than 3 months of Eliquis and has discontinued treatment.  Patient is request port removal and referral has been sent to surgery. 15: Left leg lesion: Will make referral to dermatology for biopsy.  Patient expressed understanding and was in agreement with this plan. She also understands that She can call clinic at any time with any questions, concerns, or complaints.   Breast cancer metastasized to axillary lymph node   Staging form: Breast, AJCC 7th Edition     Clinical stage from  10/22/2014: Stage Unknown (TX, N1, M0) - Signed by [name], MD on 10/22/2014   [name], MD   02/26/2020 12:19 PM

## 2020-02-26 ENCOUNTER — Inpatient Hospital Stay: Payer: Medicare HMO

## 2020-02-26 ENCOUNTER — Other Ambulatory Visit: Payer: Self-pay

## 2020-02-26 ENCOUNTER — Encounter: Payer: Self-pay | Admitting: *Deleted

## 2020-02-26 ENCOUNTER — Encounter: Payer: Self-pay | Admitting: Oncology

## 2020-02-26 ENCOUNTER — Inpatient Hospital Stay: Payer: Medicare HMO | Attending: Oncology | Admitting: Oncology

## 2020-02-26 VITALS — BP 107/89 | HR 73 | Temp 97.3°F | Resp 20 | Wt 200.5 lb

## 2020-02-26 DIAGNOSIS — Z9221 Personal history of antineoplastic chemotherapy: Secondary | ICD-10-CM | POA: Insufficient documentation

## 2020-02-26 DIAGNOSIS — Z1509 Genetic susceptibility to other malignant neoplasm: Secondary | ICD-10-CM | POA: Insufficient documentation

## 2020-02-26 DIAGNOSIS — C184 Malignant neoplasm of transverse colon: Secondary | ICD-10-CM

## 2020-02-26 DIAGNOSIS — G629 Polyneuropathy, unspecified: Secondary | ICD-10-CM | POA: Insufficient documentation

## 2020-02-26 DIAGNOSIS — L989 Disorder of the skin and subcutaneous tissue, unspecified: Secondary | ICD-10-CM

## 2020-02-26 DIAGNOSIS — Z87891 Personal history of nicotine dependence: Secondary | ICD-10-CM | POA: Insufficient documentation

## 2020-02-26 DIAGNOSIS — C189 Malignant neoplasm of colon, unspecified: Secondary | ICD-10-CM

## 2020-02-26 DIAGNOSIS — Z8542 Personal history of malignant neoplasm of other parts of uterus: Secondary | ICD-10-CM | POA: Insufficient documentation

## 2020-02-26 DIAGNOSIS — Z006 Encounter for examination for normal comparison and control in clinical research program: Secondary | ICD-10-CM | POA: Diagnosis not present

## 2020-02-26 DIAGNOSIS — M5430 Sciatica, unspecified side: Secondary | ICD-10-CM | POA: Insufficient documentation

## 2020-02-26 DIAGNOSIS — Z853 Personal history of malignant neoplasm of breast: Secondary | ICD-10-CM | POA: Insufficient documentation

## 2020-02-26 DIAGNOSIS — Z79899 Other long term (current) drug therapy: Secondary | ICD-10-CM | POA: Insufficient documentation

## 2020-02-26 DIAGNOSIS — M549 Dorsalgia, unspecified: Secondary | ICD-10-CM | POA: Insufficient documentation

## 2020-02-26 DIAGNOSIS — Z95828 Presence of other vascular implants and grafts: Secondary | ICD-10-CM

## 2020-02-26 DIAGNOSIS — Z85038 Personal history of other malignant neoplasm of large intestine: Secondary | ICD-10-CM | POA: Insufficient documentation

## 2020-02-26 DIAGNOSIS — R5383 Other fatigue: Secondary | ICD-10-CM | POA: Insufficient documentation

## 2020-02-26 LAB — CBC WITH DIFFERENTIAL/PLATELET
Abs Immature Granulocytes: 0.03 10*3/uL (ref 0.00–0.07)
Basophils Absolute: 0 10*3/uL (ref 0.0–0.1)
Basophils Relative: 1 %
Eosinophils Absolute: 0.1 10*3/uL (ref 0.0–0.5)
Eosinophils Relative: 2 %
HCT: 35.2 % — ABNORMAL LOW (ref 36.0–46.0)
Hemoglobin: 12.6 g/dL (ref 12.0–15.0)
Immature Granulocytes: 0 %
Lymphocytes Relative: 17 %
Lymphs Abs: 1.3 10*3/uL (ref 0.7–4.0)
MCH: 28.4 pg (ref 26.0–34.0)
MCHC: 35.8 g/dL (ref 30.0–36.0)
MCV: 79.3 fL — ABNORMAL LOW (ref 80.0–100.0)
Monocytes Absolute: 0.5 10*3/uL (ref 0.1–1.0)
Monocytes Relative: 6 %
Neutro Abs: 6 10*3/uL (ref 1.7–7.7)
Neutrophils Relative %: 74 %
Platelets: 229 10*3/uL (ref 150–400)
RBC: 4.44 MIL/uL (ref 3.87–5.11)
RDW: 14.3 % (ref 11.5–15.5)
WBC: 8 10*3/uL (ref 4.0–10.5)
nRBC: 0 % (ref 0.0–0.2)

## 2020-02-26 LAB — COMPREHENSIVE METABOLIC PANEL
ALT: 17 U/L (ref 0–44)
AST: 19 U/L (ref 15–41)
Albumin: 4.1 g/dL (ref 3.5–5.0)
Alkaline Phosphatase: 83 U/L (ref 38–126)
Anion gap: 7 (ref 5–15)
BUN: 13 mg/dL (ref 8–23)
CO2: 27 mmol/L (ref 22–32)
Calcium: 9.4 mg/dL (ref 8.9–10.3)
Chloride: 109 mmol/L (ref 98–111)
Creatinine, Ser: 1.13 mg/dL — ABNORMAL HIGH (ref 0.44–1.00)
GFR calc Af Amer: 57 mL/min — ABNORMAL LOW (ref >60)
GFR calc non Af Amer: 50 mL/min — ABNORMAL LOW (ref >60)
Glucose, Bld: 93 mg/dL (ref 70–99)
Potassium: 3.9 mmol/L (ref 3.5–5.1)
Sodium: 143 mmol/L (ref 135–145)
Total Bilirubin: 0.8 mg/dL (ref 0.3–1.2)
Total Protein: 7.6 g/dL (ref 6.5–8.1)

## 2020-02-26 MED ORDER — HEPARIN SOD (PORK) LOCK FLUSH 100 UNIT/ML IV SOLN
INTRAVENOUS | Status: AC
  Filled 2020-02-26: qty 5

## 2020-02-26 MED ORDER — HEPARIN SOD (PORK) LOCK FLUSH 100 UNIT/ML IV SOLN
500.0000 [IU] | Freq: Once | INTRAVENOUS | Status: AC
  Administered 2020-02-26: 500 [IU] via INTRAVENOUS
  Filled 2020-02-26: qty 5

## 2020-02-26 MED ORDER — SODIUM CHLORIDE 0.9% FLUSH
10.0000 mL | INTRAVENOUS | Status: DC | PRN
  Administered 2020-02-26: 10 mL via INTRAVENOUS
  Filled 2020-02-26: qty 10

## 2020-02-26 NOTE — Progress Notes (Signed)
Patient here today for follow up. Reports pain in left leg. Rates pain at 8. Also reports a small black bump/spot on back of left leg. States she noticed 3 weeks ago. States sometimes it is painful. Denies any other pain or concerns at this time.

## 2020-02-26 NOTE — Research (Signed)
Patient Carrie Alvarez presents to clinic this morning for her one month follow up visit on the [id number] [name] study. She had her CT of chest/abdomen and pelvis on 02/20/2020 and has her follow up colonoscopy scheduled for next week on 03/05/2020. Patient reports she is only having 2 bowel movements per day and denies any further diarrhea. Reports she is still having some numbness and tingling in her fingers, and reports this and the neuropathy in her feet and ankles bilaterally are essentially unchanged since her last visit. She continues to reports her chronic and worsening back pain, but she is ambulating independently using a cane while in clinic this morning and states she has an appointment for another injection in her back on 03/12/2020. She continues to be seen monthly in the pain management clinic for her back pain and pain medication management. Patient still reporting some ongoing fatigue, but states she now has periods where she actually feels rested at times. She also reports managing all of her self-care needs without assistance. Ms. Maddison remains afebrile and SpO2 was measured at 100% at rest, on room air while while wearing a mask. Her appetite remains poor but patient reports she eats at least 3 times daily. Her weight remains stable. Patient continues to deny any further abdominal pain. States she is ready to have her port-a-cath removed and will discuss this with Dr. Grayland Ormond again. Solicited adverse events were assessed by this RN today, and patient completed the PRO-CTCAE booklet in clinic prior to seeing Dr. Grayland Ormond, and was informed that this is her last questionnaire booklet for the study. Local labs were collected via port-a-cath this morning and CBC and chemistry values were reviewed by Dr. Grayland Ormond. CEA and TSH will result in the morning.  All previous abnormal values have now returned to the normal range with the exception of her serum creatinine level, which remains slightly  elevated at 1.13mg /dL. Patient will return to see Dr. Grayland Ormond in 3 months per his request, and again in 5-6 months for her 18 month study follow up and next CT scan. Ms. Karis did report a mole on the back of her left calf, which is black and has reportedly increased in size. Dr. Grayland Ormond will refer her to dermatology for a biopsy of this mole. Solicited and other Adverse events with grade and attribution are as noted below:   Adverse Event Log  Study/Protocol: [id number] [name] Cycle: 1 month follow up Event Grade Onset Date Resolved Date Drug Name Attribution Treatment Comments  Diarrhea 0        Constipation 0        Nausea 0     unrelated    Fatigue 1 01/01/2020   possible  Reports feeling rested at times  Anorexia 1 07/13/2008   unrelated  States this is her norm   Cough 0        Dyspnea 0        Fever 0        UTI 0        Palmer-plantar erythrodesia 0        Total biliruben 0        Neutrophils 0        TSH 0        Sensory Peripheral Neuropathy 2 07/11/2019     unrelated Gabapentin - dose recently increased  Numbness & Tingling in fingertips, toes & feet bil. from prior Taxol & Oxaliplatin  Anemia 1 01/01/2020  02/26/2020     Recovered: Hgb  12.6 today  Elevated creatinine 1 11/20/2019    unrelated  ongoing  Abdominal pain 0          Chronic back & left leg pain 2 07/13/2017   unrelated  Next appt. 02/12/20 at pain management clinic;          pain scale 10/10 today  [name], BSN, [place], [facility] 02/26/2020 10:44 AM

## 2020-02-27 LAB — CEA: CEA: 4.9 ng/mL — ABNORMAL HIGH (ref 0.0–4.7)

## 2020-02-27 LAB — THYROID PANEL WITH TSH
Free Thyroxine Index: 0.9 — ABNORMAL LOW (ref 1.2–4.9)
T3 Uptake Ratio: 24 % (ref 24–39)
T4, Total: 3.7 ug/dL — ABNORMAL LOW (ref 4.5–12.0)
TSH: 2.4 u[IU]/mL (ref 0.450–4.500)

## 2020-03-01 ENCOUNTER — Other Ambulatory Visit: Payer: Self-pay

## 2020-03-01 ENCOUNTER — Other Ambulatory Visit
Admission: RE | Admit: 2020-03-01 | Discharge: 2020-03-01 | Disposition: A | Discharge status: Home / Self Care | Payer: Medicare HMO | Source: Ambulatory Visit | Attending: Gastroenterology | Admitting: Gastroenterology

## 2020-03-01 DIAGNOSIS — Z20822 Contact with and (suspected) exposure to covid-19: Secondary | ICD-10-CM | POA: Insufficient documentation

## 2020-03-01 DIAGNOSIS — Z01812 Encounter for preprocedural laboratory examination: Secondary | ICD-10-CM | POA: Insufficient documentation

## 2020-03-01 LAB — SARS CORONAVIRUS 2 (TAT 6-24 HRS): SARS Coronavirus 2: NEGATIVE

## 2020-03-04 ENCOUNTER — Ambulatory Visit: Payer: Medicare HMO | Admitting: Oncology

## 2020-03-04 ENCOUNTER — Other Ambulatory Visit: Payer: Medicare HMO

## 2020-03-05 ENCOUNTER — Ambulatory Visit
Admission: RE | Admit: 2020-03-05 | Discharge: 2020-03-05 | Disposition: A | Discharge status: Home / Self Care | Payer: Medicare HMO | Attending: Gastroenterology | Admitting: Gastroenterology

## 2020-03-05 ENCOUNTER — Encounter
Admission: RE | Disposition: A | Discharge status: Home / Self Care | Payer: Self-pay | Source: Home / Self Care | Attending: Gastroenterology

## 2020-03-05 ENCOUNTER — Other Ambulatory Visit: Payer: Self-pay

## 2020-03-05 ENCOUNTER — Encounter: Payer: Self-pay | Admitting: Gastroenterology

## 2020-03-05 ENCOUNTER — Ambulatory Visit: Payer: Medicare HMO | Admitting: Certified Registered Nurse Anesthetist

## 2020-03-05 DIAGNOSIS — Z800 Family history of malignant neoplasm of digestive organs: Secondary | ICD-10-CM | POA: Diagnosis not present

## 2020-03-05 DIAGNOSIS — M549 Dorsalgia, unspecified: Secondary | ICD-10-CM | POA: Insufficient documentation

## 2020-03-05 DIAGNOSIS — Z87891 Personal history of nicotine dependence: Secondary | ICD-10-CM | POA: Insufficient documentation

## 2020-03-05 DIAGNOSIS — D124 Benign neoplasm of descending colon: Secondary | ICD-10-CM | POA: Insufficient documentation

## 2020-03-05 DIAGNOSIS — C184 Malignant neoplasm of transverse colon: Secondary | ICD-10-CM

## 2020-03-05 DIAGNOSIS — Z825 Family history of asthma and other chronic lower respiratory diseases: Secondary | ICD-10-CM | POA: Insufficient documentation

## 2020-03-05 DIAGNOSIS — K635 Polyp of colon: Secondary | ICD-10-CM | POA: Diagnosis not present

## 2020-03-05 DIAGNOSIS — Z9221 Personal history of antineoplastic chemotherapy: Secondary | ICD-10-CM | POA: Insufficient documentation

## 2020-03-05 DIAGNOSIS — E669 Obesity, unspecified: Secondary | ICD-10-CM | POA: Insufficient documentation

## 2020-03-05 DIAGNOSIS — Z1211 Encounter for screening for malignant neoplasm of colon: Secondary | ICD-10-CM | POA: Diagnosis not present

## 2020-03-05 DIAGNOSIS — Z980 Intestinal bypass and anastomosis status: Secondary | ICD-10-CM | POA: Insufficient documentation

## 2020-03-05 DIAGNOSIS — Z9049 Acquired absence of other specified parts of digestive tract: Secondary | ICD-10-CM | POA: Diagnosis not present

## 2020-03-05 DIAGNOSIS — Z803 Family history of malignant neoplasm of breast: Secondary | ICD-10-CM | POA: Insufficient documentation

## 2020-03-05 DIAGNOSIS — K5730 Diverticulosis of large intestine without perforation or abscess without bleeding: Secondary | ICD-10-CM | POA: Diagnosis not present

## 2020-03-05 DIAGNOSIS — E7800 Pure hypercholesterolemia, unspecified: Secondary | ICD-10-CM | POA: Insufficient documentation

## 2020-03-05 DIAGNOSIS — Z90710 Acquired absence of both cervix and uterus: Secondary | ICD-10-CM | POA: Diagnosis not present

## 2020-03-05 DIAGNOSIS — D649 Anemia, unspecified: Secondary | ICD-10-CM | POA: Insufficient documentation

## 2020-03-05 DIAGNOSIS — K219 Gastro-esophageal reflux disease without esophagitis: Secondary | ICD-10-CM | POA: Insufficient documentation

## 2020-03-05 DIAGNOSIS — Z8614 Personal history of Methicillin resistant Staphylococcus aureus infection: Secondary | ICD-10-CM | POA: Diagnosis not present

## 2020-03-05 DIAGNOSIS — Z853 Personal history of malignant neoplasm of breast: Secondary | ICD-10-CM | POA: Insufficient documentation

## 2020-03-05 DIAGNOSIS — Z6837 Body mass index (BMI) 37.0-37.9, adult: Secondary | ICD-10-CM | POA: Insufficient documentation

## 2020-03-05 DIAGNOSIS — F329 Major depressive disorder, single episode, unspecified: Secondary | ICD-10-CM | POA: Insufficient documentation

## 2020-03-05 DIAGNOSIS — Z8349 Family history of other endocrine, nutritional and metabolic diseases: Secondary | ICD-10-CM | POA: Insufficient documentation

## 2020-03-05 DIAGNOSIS — Z85038 Personal history of other malignant neoplasm of large intestine: Secondary | ICD-10-CM | POA: Insufficient documentation

## 2020-03-05 DIAGNOSIS — Z809 Family history of malignant neoplasm, unspecified: Secondary | ICD-10-CM | POA: Insufficient documentation

## 2020-03-05 DIAGNOSIS — Z8542 Personal history of malignant neoplasm of other parts of uterus: Secondary | ICD-10-CM | POA: Insufficient documentation

## 2020-03-05 DIAGNOSIS — Z8249 Family history of ischemic heart disease and other diseases of the circulatory system: Secondary | ICD-10-CM | POA: Insufficient documentation

## 2020-03-05 DIAGNOSIS — I10 Essential (primary) hypertension: Secondary | ICD-10-CM | POA: Diagnosis not present

## 2020-03-05 DIAGNOSIS — M1990 Unspecified osteoarthritis, unspecified site: Secondary | ICD-10-CM | POA: Insufficient documentation

## 2020-03-05 DIAGNOSIS — Z86010 Personal history of colonic polyps: Secondary | ICD-10-CM | POA: Diagnosis not present

## 2020-03-05 DIAGNOSIS — Z79899 Other long term (current) drug therapy: Secondary | ICD-10-CM | POA: Insufficient documentation

## 2020-03-05 HISTORY — DX: Other chronic pain: G89.29

## 2020-03-05 HISTORY — PX: COLONOSCOPY WITH PROPOFOL: SHX5780

## 2020-03-05 HISTORY — DX: Dyspnea, unspecified: R06.00

## 2020-03-05 SURGERY — COLONOSCOPY WITH PROPOFOL
Anesthesia: General

## 2020-03-05 MED ORDER — PROPOFOL 10 MG/ML IV BOLUS
INTRAVENOUS | Status: DC | PRN
  Administered 2020-03-05: 90 mg via INTRAVENOUS

## 2020-03-05 MED ORDER — PROPOFOL 500 MG/50ML IV EMUL
INTRAVENOUS | Status: DC | PRN
  Administered 2020-03-05: 130 ug/kg/min via INTRAVENOUS

## 2020-03-05 MED ORDER — LIDOCAINE HCL (CARDIAC) PF 100 MG/5ML IV SOSY
PREFILLED_SYRINGE | INTRAVENOUS | Status: DC | PRN
  Administered 2020-03-05: 50 mg via INTRAVENOUS

## 2020-03-05 MED ORDER — PROPOFOL 10 MG/ML IV BOLUS
INTRAVENOUS | Status: AC
  Filled 2020-03-05: qty 20

## 2020-03-05 MED ORDER — SODIUM CHLORIDE 0.9 % IV SOLN: INTRAVENOUS | Status: DC

## 2020-03-05 MED ORDER — PROPOFOL 500 MG/50ML IV EMUL
INTRAVENOUS | Status: AC
  Filled 2020-03-05: qty 50

## 2020-03-05 MED ORDER — LIDOCAINE HCL (PF) 2 % IJ SOLN
INTRAMUSCULAR | Status: AC
  Filled 2020-03-05: qty 5

## 2020-03-05 NOTE — Transfer of Care (Signed)
Immediate Anesthesia Transfer of Care Note  Patient: Norm Salt  Procedure(s) Performed: COLONOSCOPY WITH PROPOFOL (N/A )  Patient Location: PACU and Endoscopy Unit  Anesthesia Type:General  Level of Consciousness: awake, alert , oriented and patient cooperative  Airway & Oxygen Therapy: Patient Spontanous Breathing  Post-op Assessment: Report given to RN and Post -op Vital signs reviewed and stable  Post vital signs: Reviewed and stable  Last Vitals:  Vitals Value Taken Time  BP 120/87 03/05/20 0854  Temp 36 C 03/05/20 0854  Pulse 92 03/05/20 0854  Resp 16 03/05/20 0854  SpO2 97 % 03/05/20 0854    Last Pain:  Vitals:   03/05/20 0854  TempSrc: Temporal  PainSc: 0-No pain      Patients Stated Pain Goal:  (always high and did not take oxycodone today) (37/85/88 5027)  Complications: No complications documented.

## 2020-03-05 NOTE — H&P (Signed)
Carrie Lame, MD [address] [place], [place] [address] Phone:[phone] Fax : [phone]  Primary Care Physician:  Trinna Post, PA-C Primary Gastroenterologist:  Dr. Allen Norris  Pre-Procedure History & Physical: HPI:  Carrie Alvarez is a 69 y.o. female is here for an colonoscopy.   Past Medical History:  Diagnosis Date  . Allergy   . Anemia   . Back pain   . Breast cancer ([place]) 2015   LT LUMPECTOMY 12-2014 FOLLOWING CHEMO  . Carpal tunnel syndrome    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  . Cataract   . Colon cancer ([place]) 01/2019  . Depression   . Family history of breast cancer   . Family history of colon cancer   . GERD (gastroesophageal reflux disease)    problem with reflux during chemo  . History of methicillin resistant staphylococcus aureus (MRSA)   . Hypercholesteremia   . Hypertension   . Lumbosacral pain    sees pain management in [place]  . Obesity   . Personal history of chemotherapy 2016   left breast ca  . Personal history of chemotherapy 2020   colon ca  . Pinched nerve    left side, lumbar area  . Uterine cancer ([place]) 1994    Past Surgical History:  Procedure Laterality Date  . ABDOMINAL HYSTERECTOMY  1994   UTERINE CA  . AXILLARY LYMPH NODE BIOPSY Left 12/18/2014   Procedure: AXILLARY LYMPH NODE BIOPSY/;  Surgeon: Robert Bellow, MD;  Location: [facility] ORS;  Service: General;  Laterality: Left;  . AXILLARY LYMPH NODE DISSECTION Left 12/18/2014   Procedure: AXILLARY LYMPH NODE DISSECTION;  Surgeon: Robert Bellow, MD;  Location: [facility] ORS;  Service: General;  Laterality: Left;  . BREAST BIOPSY Left 05/2014   CORE BX OF LN, METASTATIC ADENOCARCINOMA  . BREAST LUMPECTOMY Left 12/2014   CHEMO FIRST THEN SURGERY OF LN  . BREAST SURGERY     lymph node removal  . CHOLECYSTECTOMY N/A 04/19/2016   Procedure: LAPAROSCOPIC CHOLECYSTECTOMY WITH INTRAOPERATIVE CHOLANGIOGRAM;  Surgeon: Hubbard Robinson, MD;  Location: [facility]  ORS;  Service: General;  Laterality: N/A;  . COLON RESECTION Right 01/10/2019   Procedure: HAND ASSISTED LAPAROSCOPIC RIGHT COLON RESECTION;  Surgeon: Jules Husbands, MD;  Location: [facility] ORS;  Service: General;  Laterality: Right;  . COLONOSCOPY WITH PROPOFOL N/A 12/30/2018   Procedure: COLONOSCOPY WITH PROPOFOL;  Surgeon: Carrie Lame, MD;  Location: [facility];  Service: Endoscopy;  Laterality: N/A;  . medial branch block  11/06/2015   lumbar facet Dr. Primus Bravo  . POLYPECTOMY N/A 12/30/2018   Procedure: POLYPECTOMY;  Surgeon: Carrie Lame, MD;  Location: [facility];  Service: Endoscopy;  Laterality: N/A;  Clips placed at Hepatic Flexure Polyp (2) and Transverse Colon Polyp (4) removal sites  . PORTACATH PLACEMENT Right 02/02/2019   Procedure: INSERTION PORT-A-CATH;  Surgeon: Jules Husbands, MD;  Location: [facility] ORS;  Service: General;  Laterality: Right;  . SENTINEL NODE BIOPSY Left 12/18/2014   Procedure: SENTINEL NODE BIOPSY;  Surgeon: Robert Bellow, MD;  Location: [facility] ORS;  Service: General;  Laterality: Left;    Prior to Admission medications   Medication Sig Start Date End Date Taking? Authorizing Provider  acetaminophen (TYLENOL) 500 MG tablet Take 500 mg by mouth every 6 (six) hours as needed for mild pain or moderate pain.     [provider]  albuterol (PROVENTIL HFA;VENTOLIN HFA) 108 (90 Base) MCG/ACT inhaler Inhale 2 puffs into  the lungs every 6 (six) hours as needed for wheezing or shortness of breath. 09/28/17   Trinna Post, PA-C  amLODipine (NORVASC) 5 MG tablet Take 1 tablet (5 mg total) by mouth daily. MD re-ordered at decreased dose 09/25/19   [name], MD  atorvastatin (LIPITOR) 10 MG tablet TAKE 1 TABLET BY MOUTH IN THE MORNING 01/10/20   Trinna Post, PA-C  baclofen (LIORESAL) 10 MG tablet Take 1 tablet (10 mg total) by mouth 2 (two) times daily. 10/28/17   Trinna Post, PA-C  cholecalciferol (VITAMIN D) 1000 units tablet Take  1,000 Units by mouth daily.    [provider]  diphenoxylate-atropine (LOMOTIL) 2.5-0.025 MG tablet Take 1 tablet by mouth 4 (four) times daily as needed for diarrhea or loose stools. 11/20/19   [name], MD  fluticasone (FLONASE) 50 MCG/ACT nasal spray Place 2 sprays into both nostrils daily. 01/17/20   Trinna Post, PA-C  gabapentin (NEURONTIN) 300 MG capsule Take 300-600 mg by mouth 4 (four) times daily as needed (neuropathic pain).     [provider]  letrozole (FEMARA) 2.5 MG tablet Take 2.5 mg by mouth daily.  04/07/19   [provider]  lidocaine-prilocaine (EMLA) cream Apply to affected area once 01/22/19   [name], MD  [facility] 4 MG/0.1ML LIQD nasal spray kit Place 1 spray into the nose.  06/06/19   [provider]  Oxycodone HCl 10 MG TABS LIMIT ONE HALF TO ONE TABLET BY MOUTH 3 TO 5 TIMES DAILY IF TOLERATED 02/21/19   [provider]  potassium chloride SA (KLOR-CON) 20 MEQ tablet Take 1 tablet (20 mEq total) by mouth 2 (two) times daily. 05/29/19   [name], MD  pyridoxine (B-6) 100 MG tablet Take 100 mg by mouth daily.    [provider]  sertraline (ZOLOFT) 100 MG tablet Take 2 tablets by mouth once daily 01/10/20   Trinna Post, PA-C  vitamin B-12 (CYANOCOBALAMIN) 500 MCG tablet Take 500 mcg by mouth daily.    [provider]    Allergies as of 02/09/2020  . (No Known Allergies)    Family History  Problem Relation Age of Onset  . Breast cancer Sister 82  . Colon cancer Sister   . Colon cancer Mother        dx 70s  . Hypertension Mother   . Asthma Mother   . Cancer Father        voice box removed  . Cancer Maternal Grandmother        unsure type  . Colon cancer Cousin   . Cancer Cousin        unsure type    Social History   Socioeconomic History  . Marital status: Married    Spouse name: Vicente Serene  . Number of children: 3  . Years of education: Not on file  .  Highest education level: Some college, no degree  Occupational History  . Occupation: retired    Comment: was on disability  Tobacco Use  . Smoking status: Former Smoker    Packs/day: 0.50    Types: Cigarettes    Quit date: 07/18/2014    Years since quitting: 5.6  . Smokeless tobacco: Never Used  Vaping Use  . Vaping Use: Never used  Substance and Sexual Activity  . Alcohol use: No    Alcohol/week: 0.0 standard drinks  . Drug use: No  . Sexual activity: Not on file  Other Topics  Concern  . Not on file  Social History Narrative   Husband and patient have not lived together for 10 years.  He is still helpful with patient.   Social Determinants of Health   Financial Resource Strain: Low Risk   . Difficulty of Paying Living Expenses: Not hard at all  Food Insecurity: No Food Insecurity  . Worried About Charity fundraiser in the Last Year: Never true  . Ran Out of Food in the Last Year: Never true  Transportation Needs: No Transportation Needs  . Lack of Transportation (Medical): No  . Lack of Transportation (Non-Medical): No  Physical Activity: Inactive  . Days of Exercise per Week: 0 days  . Minutes of Exercise per Session: 0 min  Stress: No Stress Concern Present  . Feeling of Stress : Not at all  Social Connections: Moderately Integrated  . Frequency of Communication with Friends and Family: Twice a week  . Frequency of Social Gatherings with Friends and Family: More than three times a week  . Attends Religious Services: More than 4 times per year  . Active Member of Clubs or Organizations: No  . Attends Archivist Meetings: Never  . Marital Status: Married  Human resources officer Violence: Not At Risk  . Fear of Current or Ex-Partner: No  . Emotionally Abused: No  . Physically Abused: No  . Sexually Abused: No    Review of Systems: See HPI, otherwise negative ROS  Physical Exam: There were no vitals taken for this visit. General:   Alert,  pleasant and  cooperative in NAD Head:  Normocephalic and atraumatic. Neck:  Supple; no masses or thyromegaly. Lungs:  Clear throughout to auscultation.    Heart:  Regular rate and rhythm. Abdomen:  Soft, nontender and nondistended. Normal bowel sounds, without guarding, and without rebound.   Neurologic:  Alert and  oriented x4;  grossly normal neurologically.  Impression/Plan: Carrie Alvarez is here for an colonoscopy to be performed for history of colon cancer in June 2020  Risks, benefits, limitations, and alternatives regarding  colonoscopy have been reviewed with the patient.  Questions have been answered.  All parties agreeable.   Carrie Lame, MD  03/05/2020, 7:46 AM

## 2020-03-05 NOTE — Op Note (Signed)
[facility] Gastroenterology Patient Name: Carrie Alvarez Procedure Date: 03/05/2020 8:28 AM MRN: [id number] Account #: [id number] Date of Birth: 1950-07-30 Admit Type: Outpatient Age: 69 Room: [facility] ENDO ROOM 4 Gender: Female Note Status: Finalized Procedure:             Colonoscopy Indications:           High risk colon cancer surveillance: Personal history                         of colon cancer Providers:             Lucilla Lame MD, MD Referring MD:          Kipp Brood. Margretta Sidle (Referring MD) Medicines:             Propofol per Anesthesia Complications:         No immediate complications. Procedure:             Pre-Anesthesia Assessment:                        - Prior to the procedure, a History and Physical was                         performed, and patient medications and allergies were                         reviewed. The patient's tolerance of previous                         anesthesia was also reviewed. The risks and benefits                         of the procedure and the sedation options and risks                         were discussed with the patient. All questions were                         answered, and informed consent was obtained. Prior                         Anticoagulants: The patient has taken no previous                         anticoagulant or antiplatelet agents. ASA Grade                         Assessment: II - A patient with mild systemic disease.                         After reviewing the risks and benefits, the patient                         was deemed in satisfactory condition to undergo the                         procedure.  After obtaining informed consent, the colonoscope was                         passed under direct vision. Throughout the procedure,                         the patient's blood pressure, pulse, and oxygen                         saturations were monitored continuously. The                          Colonoscope was introduced through the anus and                         advanced to the the cecum, identified by appendiceal                         orifice and ileocecal valve. The colonoscopy was                         performed without difficulty. The patient tolerated                         the procedure well. The quality of the bowel                         preparation was excellent. Findings:      The perianal and digital rectal examinations were normal.      A 4 mm polyp was found in the descending colon. The polyp was sessile.       The polyp was removed with a cold biopsy forceps. Resection and       retrieval were complete.      A few small-mouthed diverticula were found in the sigmoid colon.      There was evidence of a prior end-to-end colo-colonic anastomosis in the       transverse colon. This was patent and was characterized by healthy       appearing mucosa. Impression:            - One 4 mm polyp in the descending colon, removed with                         a cold biopsy forceps. Resected and retrieved.                        - Diverticulosis in the sigmoid colon.                        - Patent end-to-end colo-colonic anastomosis,                         characterized by healthy appearing mucosa. Recommendation:        - Discharge patient to home.                        - Resume previous diet.                        -  Continue present medications.                        - Await pathology results.                        - Repeat colonoscopy in 5 years for surveillance. Procedure Code(s):     --- Professional ---                        [phone], Colonoscopy, flexible; with biopsy, single or                         multiple Diagnosis Code(s):     --- Professional ---                        [phone], Personal history of other malignant neoplasm                         of large intestine                        K63.5, Polyp of colon CPT copyright 2019  American Medical Association. All rights reserved. The codes documented in this report are preliminary and upon coder review may  be revised to meet current compliance requirements. Lucilla Lame MD, MD 03/05/2020 8:51:23 AM This report has been signed electronically. Number of Addenda: 0 Note Initiated On: 03/05/2020 8:28 AM Scope Withdrawal Time: 0 hours 4 minutes 43 seconds  Total Procedure Duration: 0 hours 7 minutes 23 seconds  Estimated Blood Loss:  Estimated blood loss: none.      [facility]

## 2020-03-05 NOTE — Anesthesia Preprocedure Evaluation (Addendum)
Anesthesia Evaluation  Patient identified by MRN, date of birth, ID band Patient awake    Reviewed: Allergy & Precautions, NPO status , Patient's Chart, lab work & pertinent test results  History of Anesthesia Complications Negative for: history of anesthetic complications  Airway Mallampati: II  TM Distance: >3 FB Neck ROM: Full    Dental  (+) Dental Advidsory Given   Pulmonary neg shortness of breath, neg sleep apnea, neg COPD, neg recent URI, former smoker,    breath sounds clear to auscultation- rhonchi (-) wheezing      Cardiovascular hypertension, Pt. on medications (-) angina(-) CAD, (-) Past MI, (-) Cardiac Stents and (-) CABG  Rhythm:Regular Rate:Normal - Systolic murmurs and - Diastolic murmurs    Neuro/Psych neg Seizures PSYCHIATRIC DISORDERS Depression negative neurological ROS     GI/Hepatic Neg liver ROS, GERD  ,  Endo/Other  negative endocrine ROSneg diabetes  Renal/GU negative Renal ROS     Musculoskeletal  (+) Arthritis ,   Abdominal (+) + obese,   Peds  Hematology  (+) Blood dyscrasia, anemia ,   Anesthesia Other Findings Past Medical History: No date: Allergy No date: Anemia No date: Back pain 2015: Breast cancer ([place])     Comment:  LT LUMPECTOMY 12-2014 FOLLOWING CHEMO No date: Carpal tunnel syndrome     Comment:  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 No date: Cataract 2020: Colon cancer ([facility]) No date: Depression No date: GERD (gastroesophageal reflux disease)     Comment:  problem with reflux during chemo No date: History of methicillin resistant staphylococcus aureus (MRSA) No date: Hypercholesteremia No date: Hypertension No date: Lumbosacral pain     Comment:  sees pain management in [place] No date: Obesity 2016: Personal history of chemotherapy No date: Pinched nerve     Comment:  left side, lumbar area 1994: Uterine cancer ([place])   Reproductive/Obstetrics                             Anesthesia Physical  Anesthesia Plan  ASA: III  Anesthesia Plan: General   Post-op Pain Management:    Induction: Intravenous  PONV Risk Score and Plan: 2 and Propofol infusion and TIVA  Airway Management Planned: Natural Airway and Nasal Cannula  Additional Equipment:   Intra-op Plan:   Post-operative Plan:   Informed Consent: I have reviewed the patients History and Physical, chart, labs and discussed the procedure including the risks, benefits and alternatives for the proposed anesthesia with the patient or authorized representative who has indicated his/her understanding and acceptance.     Dental advisory given  Plan Discussed with: CRNA and Anesthesiologist  Anesthesia Plan Comments:         Anesthesia Quick Evaluation

## 2020-03-05 NOTE — Anesthesia Postprocedure Evaluation (Signed)
Anesthesia Post Note  Patient: Norm Salt  Procedure(s) Performed: COLONOSCOPY WITH PROPOFOL (N/A )  Patient location during evaluation: Endoscopy Anesthesia Type: General Level of consciousness: awake and alert Pain management: pain level controlled Vital Signs Assessment: post-procedure vital signs reviewed and stable Respiratory status: spontaneous breathing, nonlabored ventilation, respiratory function stable and patient connected to nasal cannula oxygen Cardiovascular status: blood pressure returned to baseline and stable Postop Assessment: no apparent nausea or vomiting Anesthetic complications: no   No complications documented.   Last Vitals:  Vitals:   03/05/20 0904 03/05/20 0914  BP: 121/87 (!) 120/97  Pulse: 74 74  Resp: 15 15  Temp:    SpO2: 97% 99%    Last Pain:  Vitals:   03/05/20 0914  TempSrc:   PainSc: 0-No pain                 Martha Clan

## 2020-03-06 ENCOUNTER — Encounter: Payer: Self-pay | Admitting: Gastroenterology

## 2020-03-06 DIAGNOSIS — M542 Cervicalgia: Secondary | ICD-10-CM | POA: Diagnosis not present

## 2020-03-06 DIAGNOSIS — G894 Chronic pain syndrome: Secondary | ICD-10-CM | POA: Diagnosis not present

## 2020-03-06 DIAGNOSIS — M545 Low back pain: Secondary | ICD-10-CM | POA: Diagnosis not present

## 2020-03-06 LAB — SURGICAL PATHOLOGY

## 2020-03-06 NOTE — Progress Notes (Addendum)
   03/05/20 0750  Clinical Encounter Type  Visited With Patient;Other (Comment)  Visit Type Initial  Referral From Chaplain  Consult/Referral To Chaplain  While rounding, chaplain briefly spoke with patient. She said she was fine. Patient said she does  Have a pitched nerve that is bothering her and she asked chaplain to just pray for everything.

## 2020-03-07 ENCOUNTER — Encounter: Payer: Self-pay | Admitting: Gastroenterology

## 2020-03-12 ENCOUNTER — Encounter: Payer: Self-pay | Admitting: Gastroenterology

## 2020-03-12 DIAGNOSIS — M5416 Radiculopathy, lumbar region: Secondary | ICD-10-CM | POA: Diagnosis not present

## 2020-03-13 DIAGNOSIS — M542 Cervicalgia: Secondary | ICD-10-CM | POA: Diagnosis not present

## 2020-03-13 DIAGNOSIS — G894 Chronic pain syndrome: Secondary | ICD-10-CM | POA: Diagnosis not present

## 2020-03-13 DIAGNOSIS — M545 Low back pain: Secondary | ICD-10-CM | POA: Diagnosis not present

## 2020-03-20 ENCOUNTER — Ambulatory Visit (INDEPENDENT_AMBULATORY_CARE_PROVIDER_SITE_OTHER): Payer: Medicare HMO | Admitting: Surgery

## 2020-03-20 ENCOUNTER — Encounter: Payer: Self-pay | Admitting: Surgery

## 2020-03-20 ENCOUNTER — Other Ambulatory Visit: Payer: Self-pay

## 2020-03-20 VITALS — BP 109/73 | HR 92 | Temp 98.4°F | Resp 12 | Ht 63.0 in | Wt 203.0 lb

## 2020-03-20 DIAGNOSIS — C184 Malignant neoplasm of transverse colon: Secondary | ICD-10-CM | POA: Diagnosis not present

## 2020-03-20 NOTE — Patient Instructions (Signed)
Do not shower for 48 hours. There will be glue over the wound. Do not apply any creams or ointments to the area.      Implanted Port Removal Implanted port removal is a procedure to remove the port and catheter (port-a-cath) that is implanted under your skin. The port is a small disc under your skin that can be punctured with a needle. It is connected to a vein in your chest or neck by a small flexible tube (catheter). The port-a-cath is used for treatment through an IV tube and for taking blood samples. Your health care provider will remove the port-a-cath if:  You no longer need it for treatment.  It is not working properly.  The area around it gets infected.  Tell a health care provider about:  Any allergies you have.  All medicines you are taking, including vitamins, herbs, eye drops, creams, and over-the-counter medicines.  Any problems you or family members have had with anesthetic medicines.  Any blood disorders you have.  Any surgeries you have had.  Any medical conditions you have.  Whether you are pregnant or may be pregnant. What are the risks? Generally, this is a safe procedure. However, problems may occur, including:  Infection.  Bleeding.  Allergic reactions to anesthetic medicines.  Damage to nerves or blood vessels.  What happens before the procedure?  You will have: ? A physical exam. ? Blood tests. ? Imaging tests, including a chest X-ray.  Follow instructions from your health care provider about eating or drinking restrictions.  Ask your health care provider about: ? Changing or stopping your regular medicines. This is especially important if you are taking diabetes medicines or blood thinners. ? Taking medicines such as aspirin and ibuprofen. These medicines can thin your blood. Do not take these medicines before your procedure if your surgeon instructs you not to.  Ask your health care provider how your surgical site will be marked or  identified.  You may be given antibiotic medicine to help prevent infection.  Plan to have someone take you home after the procedure.  If you will be going home right after the procedure, plan to have someone stay with you for 24 hours. What happens during the procedure?  To reduce your risk of infection: ? Your health care team will wash or sanitize their hands. ? Your skin will be washed with soap.  You may be given one or more of the following: ? A medicine to help you relax (sedative). ? A medicine to numb the area (local anesthetic).  A small cut (incision) will be made at the site of your port-a-cath.  The port-a-cath and the catheter that has been inside your vein will gently be removed.  The incision will be closed with stitches (sutures), adhesive strips, or skin glue.  A bandage (dressing) will be placed over the incision. The procedure may vary among health care providers and hospitals. What happens after the procedure?  Your blood pressure, heart rate, breathing rate, and blood oxygen level will be monitored often until the medicines you were given have worn off.  Do not drive for 24 hours if you received a sedative. This information is not intended to replace advice given to you by your health care provider. Make sure you discuss any questions you have with your health care provider. Document Released: 06/10/2015 Document Revised: 12/05/2015 Document Reviewed: 04/03/2015 Elsevier Interactive Patient Education  [employer].

## 2020-03-20 NOTE — Progress Notes (Signed)
Procedure Note 1. Removal port  Anesthesia: lidocaine 1% w epi total 10cc  EBL: minimal  Complications: none  After informed consent was obtained she was placed in the supine position and prepped and draped in the standard fashion.  Local anesthetic was infiltrated and incision was created with a 15 blade knife.  Using Metzenbaum scissors we dissected into the pocket of the port detached from adjacent structures.  We also cut prolene Sutures .  We asked the patient to do a Valsalva maneuver and remove before in the standard fashion.  We placed pressure for a few minutes.  The wound was closed in a 2 layer fashion with 3-0 Vicryl and 4-0 Monocryl for the skin in a subcuticular fashion.  Dermabond was applied.  No complications

## 2020-03-27 ENCOUNTER — Other Ambulatory Visit: Payer: Self-pay

## 2020-03-27 ENCOUNTER — Telehealth (INDEPENDENT_AMBULATORY_CARE_PROVIDER_SITE_OTHER): Payer: Medicare HMO | Admitting: Surgery

## 2020-03-27 ENCOUNTER — Encounter: Payer: Self-pay | Admitting: Surgery

## 2020-03-27 DIAGNOSIS — Z09 Encounter for follow-up examination after completed treatment for conditions other than malignant neoplasm: Secondary | ICD-10-CM

## 2020-03-27 NOTE — Progress Notes (Signed)
Phone call placed to her cell phone. S/p port removal Doing well, no pain No concerns, no fevers RTC prn

## 2020-04-08 ENCOUNTER — Other Ambulatory Visit: Payer: Self-pay | Admitting: Physician Assistant

## 2020-04-08 ENCOUNTER — Other Ambulatory Visit: Payer: Self-pay | Admitting: Oncology

## 2020-04-08 DIAGNOSIS — M545 Low back pain: Secondary | ICD-10-CM | POA: Diagnosis not present

## 2020-04-08 DIAGNOSIS — M7061 Trochanteric bursitis, right hip: Secondary | ICD-10-CM | POA: Diagnosis not present

## 2020-04-08 DIAGNOSIS — G894 Chronic pain syndrome: Secondary | ICD-10-CM | POA: Diagnosis not present

## 2020-04-08 DIAGNOSIS — E7800 Pure hypercholesterolemia, unspecified: Secondary | ICD-10-CM

## 2020-04-08 DIAGNOSIS — M7062 Trochanteric bursitis, left hip: Secondary | ICD-10-CM | POA: Diagnosis not present

## 2020-04-08 DIAGNOSIS — M5136 Other intervertebral disc degeneration, lumbar region: Secondary | ICD-10-CM | POA: Diagnosis not present

## 2020-04-08 NOTE — Telephone Encounter (Signed)
Requested medication (s) are due for refill today -yes  Requested medication (s) are on the active medication list -yes  Future visit scheduled -no  Last refill: 01/10/20  Notes to clinic: Request RF- fails lab protocol- visit up to date.Sent for review of request  Requested Prescriptions  Pending Prescriptions Disp Refills   atorvastatin (LIPITOR) 10 MG tablet [Pharmacy Med Name: Atorvastatin Calcium 10 MG Oral Tablet] 90 tablet 0    Sig: TAKE 1 TABLET BY MOUTH IN THE MORNING      Cardiovascular:  Antilipid - Statins Failed - 04/08/2020 10:51 AM      Failed - Total Cholesterol in normal range and within 360 days    Cholesterol, Total  Date Value Ref Range Status  03/23/2019 139 100 - 199 mg/dL Final          Failed - LDL in normal range and within 360 days    LDL Chol Calc (NIH)  Date Value Ref Range Status  03/23/2019 48 0 - 99 mg/dL Final          Failed - HDL in normal range and within 360 days    HDL  Date Value Ref Range Status  03/23/2019 76 >39 mg/dL Final          Failed - Triglycerides in normal range and within 360 days    Triglycerides  Date Value Ref Range Status  03/23/2019 79 0 - 149 mg/dL Final          Passed - Patient is not pregnant      Passed - Valid encounter within last 12 months    Recent Outpatient Visits           4 months ago Annual physical exam   [employer], Adriana M, PA-C   1 year ago Essential hypertension   [employer], Adriana M, [place]   1 year ago Annual physical exam   [employer], Adriana M, [place]   1 year ago Acute non-recurrent frontal sinusitis   [facility] Marquez, Adriana M, [place]   1 year ago Essential hypertension   [employer], Wendee Beavers, PA-C       Future Appointments             In 1 month [place], [place], MD [place]   In 7 months [place], Adriana M, PA-C [employer], [facility]                 Requested Prescriptions  Pending Prescriptions Disp Refills   atorvastatin (LIPITOR) 10 MG tablet [Pharmacy Med Name: Atorvastatin Calcium 10 MG Oral Tablet] 90 tablet 0    Sig: TAKE 1 TABLET BY MOUTH IN THE MORNING      Cardiovascular:  Antilipid - Statins Failed - 04/08/2020 10:51 AM      Failed - Total Cholesterol in normal range and within 360 days    Cholesterol, Total  Date Value Ref Range Status  03/23/2019 139 100 - 199 mg/dL Final          Failed - LDL in normal range and within 360 days    LDL Chol Calc (NIH)  Date Value Ref Range Status  03/23/2019 48 0 - 99 mg/dL Final          Failed - HDL in normal range and within 360 days    HDL  Date Value Ref Range Status  03/23/2019 76 >39 mg/dL Final  Failed - Triglycerides in normal range and within 360 days    Triglycerides  Date Value Ref Range Status  03/23/2019 79 0 - 149 mg/dL Final          Passed - Patient is not pregnant      Passed - Valid encounter within last 12 months    Recent Outpatient Visits           4 months ago Annual physical exam   [facility] Trinna Post, [place]   1 year ago Essential hypertension   [facility] Trinna Post, [place]   1 year ago Annual physical exam   [facility] Carles Collet M, [place]   1 year ago Acute non-recurrent frontal sinusitis   [facility] [place], Wendee Beavers, [place]   1 year ago Essential hypertension   [facility] Trinna Post, [place]       Future Appointments             In 1 month Laurence Ferrari, [place], MD [place]   In 7 months Terrilee Croak, Wendee Beavers, [place], [place]

## 2020-04-09 NOTE — Telephone Encounter (Signed)
L.O.V. was on 11/20/2019 and next appointment is on 11/20/2020. Medication send into pharmacy.

## 2020-04-19 ENCOUNTER — Telehealth (INDEPENDENT_AMBULATORY_CARE_PROVIDER_SITE_OTHER): Payer: Medicare HMO | Admitting: Physician Assistant

## 2020-04-19 DIAGNOSIS — Z20822 Contact with and (suspected) exposure to covid-19: Secondary | ICD-10-CM

## 2020-04-19 DIAGNOSIS — I10 Essential (primary) hypertension: Secondary | ICD-10-CM | POA: Diagnosis not present

## 2020-04-19 DIAGNOSIS — J40 Bronchitis, not specified as acute or chronic: Secondary | ICD-10-CM

## 2020-04-19 MED ORDER — PREDNISONE 20 MG PO TABS: 20.0000 mg | ORAL_TABLET | Freq: Every day | ORAL | 0 refills | Status: DC

## 2020-04-19 MED ORDER — AMLODIPINE BESYLATE 5 MG PO TABS: 5.0000 mg | ORAL_TABLET | Freq: Every day | ORAL | 1 refills | Status: DC

## 2020-04-19 MED ORDER — ALBUTEROL SULFATE HFA 108 (90 BASE) MCG/ACT IN AERS: 2.0000 | INHALATION_SPRAY | Freq: Four times a day (QID) | RESPIRATORY_TRACT | 0 refills | Status: DC | PRN

## 2020-04-19 NOTE — Progress Notes (Signed)
MyChart Video Visit    Virtual Visit via Video Note   This visit type was conducted due to national recommendations for restrictions regarding the COVID-19 Pandemic (e.g. social distancing) in an effort to limit this patient's exposure and mitigate transmission in our community. This patient is at least at moderate risk for complications without adequate follow up. This format is felt to be most appropriate for this patient at this time. Physical exam was limited by quality of the video and audio technology used for the visit.   Patient location: Home Provider location: Office   I discussed the limitations of evaluation and management by telemedicine and the availability of in person appointments. The patient expressed understanding and agreed to proceed.  Patient: Carrie Alvarez   DOB: Apr 04, 1951   69 y.o. Female  MRN: [id number] Visit Date: 04/19/2020  Today's healthcare provider: Trinna Post, PA-C   Chief Complaint  Patient presents with  . Sinusitis   Subjective    HPI   Patient presents today with 7 days of shortness of breath, particularly at night. She denies fevers, chills, nausea, vomiting. No muscle aches worse than usual. She reports a sensation of choking. She is coughing a little. She is vaccinated against COVID with second dose in March 2021. She is wheezing. She reports she has run out of her albuterol inhaler. She lives at home with school aged grand children.     Medications: Outpatient Medications Prior to Visit  Medication Sig  . acetaminophen (TYLENOL) 500 MG tablet Take 500 mg by mouth every 6 (six) hours as needed for mild pain or moderate pain.   Marland Kitchen albuterol (PROVENTIL HFA;VENTOLIN HFA) 108 (90 Base) MCG/ACT inhaler Inhale 2 puffs into the lungs every 6 (six) hours as needed for wheezing or shortness of breath.  Marland Kitchen amLODipine (NORVASC) 5 MG tablet Take 1 tablet (5 mg total) by mouth daily. MD re-ordered at decreased dose  . atorvastatin  (LIPITOR) 10 MG tablet TAKE 1 TABLET BY MOUTH IN THE MORNING  . baclofen (LIORESAL) 10 MG tablet Take 1 tablet (10 mg total) by mouth 2 (two) times daily.  . cholecalciferol (VITAMIN D) 1000 units tablet Take 1,000 Units by mouth daily.  . fluticasone (FLONASE) 50 MCG/ACT nasal spray Place 2 sprays into both nostrils daily.  Marland Kitchen gabapentin (NEURONTIN) 300 MG capsule Take 300-600 mg by mouth 4 (four) times daily as needed (neuropathic pain).   Marland Kitchen letrozole (FEMARA) 2.5 MG tablet Take 2.5 mg by mouth daily.   Marland Kitchen NARCAN 4 MG/0.1ML LIQD nasal spray kit Place 1 spray into the nose.   Marland Kitchen Oxycodone HCl 10 MG TABS LIMIT ONE HALF TO ONE TABLET BY MOUTH 3 TO 5 TIMES DAILY IF TOLERATED  . pyridoxine (B-6) 100 MG tablet Take 100 mg by mouth daily.  . sertraline (ZOLOFT) 100 MG tablet Take 2 tablets by mouth once daily  . vitamin B-12 (CYANOCOBALAMIN) 500 MCG tablet Take 500 mcg by mouth daily.   Facility-Administered Medications Prior to Visit  Medication Dose Route Frequency Provider  . heparin lock flush 100 unit/mL  500 Units Intravenous Once [name], MD  . heparin lock flush 100 unit/mL  500 Units Intravenous Once [name], MD  . sodium chloride flush (NS) 0.9 % injection 10 mL  10 mL Intravenous PRN Grayland Ormond Kathlene November, MD    Review of Systems  Constitutional: Positive for fatigue.  HENT: Positive for congestion, sinus pressure, sinus pain and sneezing.   Respiratory: Positive  for cough.   Neurological: Positive for headaches.      Objective    There were no vitals taken for this visit.   Physical Exam Constitutional:      Appearance: Normal appearance.  Pulmonary:     Effort: Pulmonary effort is normal. No respiratory distress.  Neurological:     Mental Status: She is alert.  Psychiatric:        Mood and Affect: Mood normal.        Behavior: Behavior normal.        Assessment & Plan    1. Bronchitis  - albuterol (VENTOLIN HFA) 108 (90 Base) MCG/ACT  inhaler; Inhale 2 puffs into the lungs every 6 (six) hours as needed for wheezing or shortness of breath.  Dispense: 6.7 g; Refill: 0 - predniSONE (DELTASONE) 20 MG tablet; Take 1 tablet (20 mg total) by mouth daily with breakfast.  Dispense: 5 tablet; Refill: 0  2. Contact with and (suspected) exposure to covid-19  She will attend drive by testing at office today.   - Novel Coronavirus, NAA ([facility])  3. Suspected COVID-19 virus infection   4. Primary hypertension  - amLODipine (NORVASC) 5 MG tablet; Take 1 tablet (5 mg total) by mouth daily. MD re-ordered at decreased dose  Dispense: 90 tablet; Refill: 1   No follow-ups on file.     I discussed the assessment and treatment plan with the patient. The patient was provided an opportunity to ask questions and all were answered. The patient agreed with the plan and demonstrated an understanding of the instructions.   The patient was advised to call back or seek an in-person evaluation if the symptoms worsen or if the condition fails to improve as anticipated.   ITrinna Post, PA-C, have reviewed all documentation for this visit. The documentation on 04/19/20 for the exam, diagnosis, procedures, and orders are all accurate and complete.  The entirety of the information documented in the History of Present Illness, Review of Systems and Physical Exam were personally obtained by me. Portions of this information were initially documented by Wilburt Finlay, CMA and reviewed by me for thoroughness and accuracy.    Paulene Floor [facility] [phone] (phone) [phone] (fax)  [place]

## 2020-04-22 DIAGNOSIS — G8929 Other chronic pain: Secondary | ICD-10-CM | POA: Diagnosis not present

## 2020-04-22 DIAGNOSIS — M5416 Radiculopathy, lumbar region: Secondary | ICD-10-CM | POA: Diagnosis not present

## 2020-04-22 DIAGNOSIS — M545 Low back pain, unspecified: Secondary | ICD-10-CM | POA: Diagnosis not present

## 2020-04-22 LAB — NOVEL CORONAVIRUS, NAA: SARS-CoV-2, NAA: NOT DETECTED

## 2020-04-22 LAB — SPECIMEN STATUS REPORT

## 2020-05-06 DIAGNOSIS — M5136 Other intervertebral disc degeneration, lumbar region: Secondary | ICD-10-CM | POA: Diagnosis not present

## 2020-05-06 DIAGNOSIS — M19011 Primary osteoarthritis, right shoulder: Secondary | ICD-10-CM | POA: Diagnosis not present

## 2020-05-06 DIAGNOSIS — M13861 Other specified arthritis, right knee: Secondary | ICD-10-CM | POA: Diagnosis not present

## 2020-05-06 DIAGNOSIS — G894 Chronic pain syndrome: Secondary | ICD-10-CM | POA: Diagnosis not present

## 2020-05-08 DIAGNOSIS — M545 Low back pain, unspecified: Secondary | ICD-10-CM | POA: Diagnosis not present

## 2020-05-08 DIAGNOSIS — M9963 Osseous and subluxation stenosis of intervertebral foramina of lumbar region: Secondary | ICD-10-CM | POA: Diagnosis not present

## 2020-05-08 DIAGNOSIS — M5116 Intervertebral disc disorders with radiculopathy, lumbar region: Secondary | ICD-10-CM | POA: Diagnosis not present

## 2020-05-08 DIAGNOSIS — M4316 Spondylolisthesis, lumbar region: Secondary | ICD-10-CM | POA: Diagnosis not present

## 2020-05-13 DIAGNOSIS — G894 Chronic pain syndrome: Secondary | ICD-10-CM | POA: Diagnosis not present

## 2020-05-17 DIAGNOSIS — M5416 Radiculopathy, lumbar region: Secondary | ICD-10-CM | POA: Diagnosis not present

## 2020-05-25 NOTE — Progress Notes (Signed)
[place]  [phone] [phone]  ID: Carrie Alvarez OB: 04/02/1951  MR#: [id number]  PYK#:[id number]  Patient Care Team: Paulene Floor as PCP - General (Physician Assistant) [name], MD as Consulting Physician (Oncology) [name], MD as Attending Physician (Pain Medicine) [name]., MD as Consulting Physician (Ophthalmology) [name], RN as Oncology Nurse Navigator Normajean Glasgow, MD as Attending Physician (Physical Medicine and Rehabilitation)  CHIEF COMPLAINT: Stage IIa ER+ left breast cancer with no breast lesion and axillary lymph node metastasis.  Now with stage IIIa colon cancer.  INTERVAL HISTORY: Patient returns to clinic today for routine 20-monthevaluation.  Her back and leg pain have significantly improved.  She currently feels well.  She does not complain of weakness or fatigue today.  She denies any recent fevers or illnesses.  She has no chest pain, shortness of breath, cough, or hemoptysis.  She denies any nausea, vomiting, constipation, or diarrhea. She has no melena or hematochezia.  She has no urinary complaints.  Patient offers no specific complaints today.  REVIEW OF SYSTEMS:   Review of Systems  Constitutional: Negative.  Negative for fever, malaise/fatigue and weight loss.  Respiratory: Negative.  Negative for cough and shortness of breath.   Cardiovascular: Negative.  Negative for chest pain and leg swelling.  Gastrointestinal: Negative.  Negative for abdominal pain, blood in stool, constipation, diarrhea, melena, nausea and vomiting.  Genitourinary: Negative.  Negative for dysuria.  Musculoskeletal: Positive for back pain. Negative for neck pain.  Skin: Negative.  Negative for rash.  Neurological: Positive for sensory change. Negative for dizziness, tingling, focal weakness, weakness and headaches.  Psychiatric/Behavioral: Negative.  Negative for depression. The patient is not  nervous/anxious.     As per HPI. Otherwise, a complete review of systems is negative.  PAST MEDICAL HISTORY: Past Medical History:  Diagnosis Date  . Allergy   . Anemia   . Back pain   . Breast cancer ([place] 2015   LT LUMPECTOMY 12-2014 FOLLOWING CHEMO  . Carpal tunnel syndrome    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  . Cataract   . Chronic pain   . Colon cancer ([place] 01[place]  . Depression   . Dyspnea   . Family history of breast cancer   . Family history of colon cancer   . GERD (gastroesophageal reflux disease)    problem with reflux during chemo  . History of methicillin resistant staphylococcus aureus (MRSA)   . Hypercholesteremia   . Hypertension   . Lumbosacral pain    sees pain management in [place]  . Obesity   . Personal history of chemotherapy 2016   left breast ca  . Personal history of chemotherapy 2020   colon ca  . Pinched nerve    left side, lumbar area  . [place]terine cancer ([place]    PAST SURGICAL HISTORY: Past Surgical History:  Procedure Laterality Date  . ABDOMINAL HYSTERECTOMY  1994   UTERINE CA  . AXILLARY LYMPH NODE BIOPSY Left 12/18/2014   Procedure: AXILLARY LYMPH NODE BIOPSY/;  Surgeon: JRobert Bellow MD;  Location: [facility] ORS;  Service: General;  Laterality: Left;  . AXILLARY LYMPH NODE DISSECTION Left 12/18/2014   Procedure: AXILLARY LYMPH NODE DISSECTION;  Surgeon: JRobert Bellow MD;  Location: [facility] ORS;  Service: General;  Laterality: Left;  . BREAST BIOPSY Left 05/2014   CORE BX OF LN, METASTATIC ADENOCARCINOMA  . BREAST LUMPECTOMY Left 12/2014   CHEMO FIRST  THEN SURGERY OF LN  . BREAST SURGERY     lymph node removal  . CHOLECYSTECTOMY N/A 04/19/2016   Procedure: LAPAROSCOPIC CHOLECYSTECTOMY WITH INTRAOPERATIVE CHOLANGIOGRAM;  Surgeon: Hubbard Robinson, MD;  Location: [facility] ORS;  Service: General;  Laterality: N/A;  . COLON RESECTION Right 01/10/2019   Procedure: HAND ASSISTED LAPAROSCOPIC RIGHT COLON RESECTION;  Surgeon:  Jules Husbands, MD;  Location: [facility] OR[facility];  Service: General;  Laterality: Right;  . COLONOSCOPY WITH PROPOFOL N/A 12/30/2018   Procedure: COLONOSCOPY WITH PROPOFOL;  Surgeon: Lucilla Lame, MD;  Location: [place];  Service: Endoscopy;  Laterality: N/A;  . COLONOSCOPY WITH PROPOFOL N/A 03/05/2020   Procedure: COLONOSCOPY WITH PROPOFOL;  Surgeon: Lucilla Lame, MD;  Location[facility] END[facility]COPY;  Service: Endoscopy;  Laterality: N/A;  . medial branch block  11/06/2015   lumbar facet Dr. Primus Bravo  . POLYPECTOMY N/A 12/30/2018   Procedure: POLYPECTOMY;  Surgeon: Lucilla Lame, MD;  Location: [place];  Service: Endoscopy;  Laterality: N/A;  Clips placed at Hepatic Flexure Polyp (2) and Transverse Colon Polyp (4) removal sites  . PORTACATH PLACEMENT Right 02/02/2019   Procedure: INSERTION PORT-A-CATH;  Surgeon: Jules Husbands, MD;  Location: [facility] OR[facility];  Service: General;  Laterality: Right;  . SENTINEL NODE BIOPSY Left 12/18/2014   Procedure: SENTINEL NODE BIOPSY;  Surgeon: Robert Bellow, MD;  Location: [facility] OR[facility];  Service: General;  Laterality: Left;    FAMILY HISTORY Family History  Problem Relation Age of Onset  . Breast cancer Sister 43  . Colon cancer Sister   . Colon cancer Mother        dx 67s  . Hypertension Mother   . Asthma Mother   . Cancer Father        voice box removed  . Cancer Maternal Grandmother        unsure type  . Colon cancer Cousin   . Cancer Cousin        unsure type       ADVANCED DIRECTIVES:    HEALTH MAINTENANCE: Social History   Tobacco Use  . Smoking status: Former Smoker    Packs/day: 0.50    Types: Cigarettes    Quit date: 07/18/2014    Years since quitting: 5.8  . Smokeless tobacco: Never Used  Vaping Use  . Vaping Use: Never used  Substance Use Topics  . Alcohol use: No    Alcohol/week: 0.0 standard drinks  . Drug use: No    No Known Allergies  Current Outpatient Medications  Medication Sig Dispense Refill  . acetaminophen  (TYLENOL) 500 MG tablet Take 500 mg by mouth every 6 (six) hours as needed for mild pain or moderate pain.     Marland Kitchen albuterol (VENTOLIN HFA) 108 (90 Base) MCG/ACT inhaler Inhale 2 puffs into the lungs every 6 (six) hours as needed for wheezing or shortness of breath. 6.7 g 0  . amLODipine (NORVASC) 5 MG tablet Take 1 tablet (5 mg total) by mouth daily. MD re-ordered at decreased dose 90 tablet 1  . atorvastatin (LIPITOR) 10 MG tablet TAKE 1 TABLET BY MOUTH IN THE MORNING 90 tablet 2  . baclofen (LIORESAL) 10 MG tablet Take 1 tablet (10 mg total) by mouth 2 (two) times daily. 30 each 0  . cholecalciferol (VITAMIN D) 1000 units tablet Take 1,000 Units by mouth daily.    . fluticasone (FLONASE) 50 MCG/ACT nasal spray Place 2 sprays into both nostrils daily. 16 g 2  . gabapentin (NEURONTIN) 300 MG  capsule Take 300-600 mg by mouth 4 (four) times daily as needed (neuropathic pain).     Marland Kitchen letrozole (FEMARA) 2.5 MG tablet Take 2.5 mg by mouth daily.     Marland Kitchen NARCAN 4 MG/0.1ML LIQD nasal spray kit Place 1 spray into the nose.     Marland Kitchen Oxycodone HCl 10 MG TABS LIMIT ONE HALF TO ONE TABLET BY MOUTH 3 TO 5 TIMES DAILY IF TOLERATED    . pyridoxine (B-6) 100 MG tablet Take 100 mg by mouth daily.    . sertraline (ZOLOFT) 100 MG tablet Take 2 tablets by mouth once daily 180 tablet 1  . vitamin B-12 (CYANOCOBALAMIN) 500 MCG tablet Take 500 mcg by mouth daily.    . predniSONE (DELTASONE) 20 MG tablet Take 1 tablet (20 mg total) by mouth daily with breakfast. (Patient not taking: Reported on 05/30/2020) 5 tablet 0   No current facility-administered medications for this visit.   Facility-Administered Medications Ordered in Other Visits  Medication Dose Route Frequency Provider Last Rate Last Admin  . heparin lock flush 100 unit/mL  500 Units Intravenous Once [name], MD      . heparin lock flush 100 unit/mL  500 Units Intravenous Once [name], MD      . sodium chloride flush (NS) 0.9 % injection 10  mL  10 mL Intravenous PRN [name], MD   10 mL at 02/27/19 0925    OBJECTIVE: Vitals:   05/30/20 1018  BP: 111/86  Pulse: 71  Temp: 98.8 F (37.1 C)  SpO2: 100%     Body mass index is 36.4 kg/m.    ECOG FS:1 - Symptomatic but completely ambulatory  General: Well-developed, well-nourished, no acute distress. Eyes: Pink conjunctiva, anicteric sclera. HEENT: Normocephalic, moist mucous membranes. Lungs: No audible wheezing or coughing. Heart: Regular rate and rhythm. Abdomen: Soft, nontender, no obvious distention. Musculoskeletal: No edema, cyanosis, or clubbing. Neuro: Alert, answering all questions appropriately. Cranial nerves grossly intact. Skin: No rashes or petechiae noted. Psych: Normal affect.   LAB RESULTS:  Lab Results  Component Value Date   NA 140 05/30/2020   K 4.0 05/30/2020   CL 107 05/30/2020   CO2 25 05/30/2020   GLUCOSE 97 05/30/2020   BUN 15 05/30/2020   CREATININE 1.04 (H) 05/30/2020   CALCIUM 9.5 05/30/2020   PROT 7.2 05/30/2020   ALBUMIN 3.9 05/30/2020   AST 17 05/30/2020   ALT 17 05/30/2020   ALKPHOS 73 05/30/2020   BILITOT 0.8 05/30/2020   GFRNONAA 58 (L) 05/30/2020   GFRAA 57 (L) 02/26/2020    Lab Results  Component Value Date   WBC 9.2 05/30/2020   NEUTROABS 6.4 05/30/2020   HGB 12.6 05/30/2020   HCT 35.8 (L) 05/30/2020   MCV 81.4 05/30/2020   PLT 209 05/30/2020     STUDIES: No results found.  ASSESSMENT: Stage IIa ER+ left breast cancer with no breast lesion and axillary lymph node metastasis. (TxN1M0)  HER-2 negative.  Now with stage IIIa colon cancer.  PLAN:   1.  Stage IIIa colon cancer: Pathology report reviewed independently.  Previously, patient agreed to enroll in clinical trial.  Oxaliplatin was discontinued early secondary to worsening neuropathy.  Patient completed 12 cycles of chemotherapy on July 31, 2019 and then completed maintenance Tecentriq on January 29, 2020.  Restaging CT scan on February 20, 2020  reviewed independently with no obvious evidence of recurrent or metastatic disease.  Colonoscopy on March 05, 2020 which only revealed a  4 mm benign polyp.  Because she has Lynch syndrome, this should be repeated in 1 year.  Return to clinic in 3 months for repeat laboratory work and routine evaluation.   2. Stage IIa ER+ left breast cancer with no breast lesion and axillary lymph node metastasis: Despite no obvious breast lesion on mammogram or breast MRI, pathology and pattern of spread was consistent with primary breast cancer. CT and bone scan at time of diagnosis revealed no other evidence of malignancy.  She only received 3 cycles of Adriamycin and Cytoxan. Taxol was discontinued early as well secondary to persistent peripheral neuropathy. Patient completed her chemotherapy on Nov 19, 2014.  She elected not to pursue axillary node dissection given the potential morbidity of this procedure. It was also elected not to pursue adjuvant XRT given there was no primary breast lesion.  Patient completed approximately 4 years of treatment with letrozole, but this was discontinued secondary to enrolling in clinical trial for her newly diagnosed colon cancer.  Her most recent mammogram on July 04, 2019 was reported as BI-RADS 1.  Repeat mammogram in December 2021.  3.  Bone health: Patient's most recent bone mineral density on February 03, 2018 reported T score of -0.7 which is unchanged from 3 years prior.  Consider repeat bone mineral density in the near future.  4.  Lynch syndrome: Genetic testing confirmed patient has Lynch syndrome.  She reports her sister and son both have tested positive.  She reports her children are not interested in genetic testing.  Appreciate genetics input.  Continue yearly colonoscopies as above.  5.  Endometrial cancer: Patient has had a total hysterectomy. 6.  Back pain/sciatica: Improved.  Continue monthly follow-up with pain clinic as scheduled.  Patient reports insurance will  only allow 3 injections every 6 months.   7.  Peripheral neuropathy: Improved.  Patient is now taking gabapentin 600 mg 4 times per day.  Continue follow-up with pain clinic as scheduled.  8.  Anemia: Resolved. 9.  Renal insufficiency: Resolved. 10.  Hypokalemia: Resolved.  Continue oral potassium supplementation.  11.  Diarrhea: Resolved.  Continue Imodium and Lomotil as needed. 12.  Fatigue: Improved. 13.  Hypotension: Resolved.  Continue Norvasc 5 mg daily. 14.  Port associated clot: Patient has now completed greater than 3 months of Eliquis and has discontinued treatment.  Port has been removed. 15: Left leg lesion: Patient was previously given a referral to dermatology.  Patient expressed understanding and was in agreement with this plan. She also understands that She can call clinic at any time with any questions, concerns, or complaints.   Breast cancer metastasized to axillary lymph node   Staging form: Breast, AJCC 7th Edition     Clinical stage from 10/22/2014: Stage Unknown (TX, N1, M0) - Signed by [name], MD on 10/22/2014   [name], MD   06/01/2020 10:03 AM

## 2020-05-30 ENCOUNTER — Inpatient Hospital Stay: Payer: Medicare HMO | Attending: Oncology

## 2020-05-30 ENCOUNTER — Encounter: Payer: Self-pay | Admitting: Oncology

## 2020-05-30 ENCOUNTER — Other Ambulatory Visit: Payer: Self-pay

## 2020-05-30 ENCOUNTER — Inpatient Hospital Stay: Payer: Medicare HMO | Admitting: Oncology

## 2020-05-30 VITALS — BP 111/86 | HR 71 | Temp 98.8°F | Wt 205.5 lb

## 2020-05-30 DIAGNOSIS — M549 Dorsalgia, unspecified: Secondary | ICD-10-CM | POA: Diagnosis not present

## 2020-05-30 DIAGNOSIS — C50912 Malignant neoplasm of unspecified site of left female breast: Secondary | ICD-10-CM | POA: Diagnosis not present

## 2020-05-30 DIAGNOSIS — M48062 Spinal stenosis, lumbar region with neurogenic claudication: Secondary | ICD-10-CM | POA: Diagnosis not present

## 2020-05-30 DIAGNOSIS — Z170 Estrogen receptor positive status [ER+]: Secondary | ICD-10-CM | POA: Diagnosis not present

## 2020-05-30 DIAGNOSIS — Z800 Family history of malignant neoplasm of digestive organs: Secondary | ICD-10-CM | POA: Diagnosis not present

## 2020-05-30 DIAGNOSIS — M48061 Spinal stenosis, lumbar region without neurogenic claudication: Secondary | ICD-10-CM | POA: Insufficient documentation

## 2020-05-30 DIAGNOSIS — G629 Polyneuropathy, unspecified: Secondary | ICD-10-CM | POA: Insufficient documentation

## 2020-05-30 DIAGNOSIS — M4316 Spondylolisthesis, lumbar region: Secondary | ICD-10-CM | POA: Insufficient documentation

## 2020-05-30 DIAGNOSIS — Z803 Family history of malignant neoplasm of breast: Secondary | ICD-10-CM

## 2020-05-30 DIAGNOSIS — C184 Malignant neoplasm of transverse colon: Secondary | ICD-10-CM

## 2020-05-30 DIAGNOSIS — C541 Malignant neoplasm of endometrium: Secondary | ICD-10-CM | POA: Diagnosis not present

## 2020-05-30 DIAGNOSIS — Z90710 Acquired absence of both cervix and uterus: Secondary | ICD-10-CM | POA: Insufficient documentation

## 2020-05-30 DIAGNOSIS — L989 Disorder of the skin and subcutaneous tissue, unspecified: Secondary | ICD-10-CM | POA: Insufficient documentation

## 2020-05-30 DIAGNOSIS — Z87891 Personal history of nicotine dependence: Secondary | ICD-10-CM | POA: Diagnosis not present

## 2020-05-30 DIAGNOSIS — M5430 Sciatica, unspecified side: Secondary | ICD-10-CM | POA: Insufficient documentation

## 2020-05-30 DIAGNOSIS — Z808 Family history of malignant neoplasm of other organs or systems: Secondary | ICD-10-CM | POA: Diagnosis not present

## 2020-05-30 DIAGNOSIS — Z79899 Other long term (current) drug therapy: Secondary | ICD-10-CM | POA: Insufficient documentation

## 2020-05-30 DIAGNOSIS — M5416 Radiculopathy, lumbar region: Secondary | ICD-10-CM | POA: Diagnosis not present

## 2020-05-30 DIAGNOSIS — C189 Malignant neoplasm of colon, unspecified: Secondary | ICD-10-CM | POA: Diagnosis not present

## 2020-05-30 DIAGNOSIS — C773 Secondary and unspecified malignant neoplasm of axilla and upper limb lymph nodes: Secondary | ICD-10-CM | POA: Insufficient documentation

## 2020-05-30 LAB — CBC WITH DIFFERENTIAL/PLATELET
Abs Immature Granulocytes: 0.04 10*3/uL (ref 0.00–0.07)
Basophils Absolute: 0.1 10*3/uL (ref 0.0–0.1)
Basophils Relative: 1 %
Eosinophils Absolute: 0.2 10*3/uL (ref 0.0–0.5)
Eosinophils Relative: 2 %
HCT: 35.8 % — ABNORMAL LOW (ref 36.0–46.0)
Hemoglobin: 12.6 g/dL (ref 12.0–15.0)
Immature Granulocytes: 0 %
Lymphocytes Relative: 21 %
Lymphs Abs: 1.9 10*3/uL (ref 0.7–4.0)
MCH: 28.6 pg (ref 26.0–34.0)
MCHC: 35.2 g/dL (ref 30.0–36.0)
MCV: 81.4 fL (ref 80.0–100.0)
Monocytes Absolute: 0.6 10*3/uL (ref 0.1–1.0)
Monocytes Relative: 6 %
Neutro Abs: 6.4 10*3/uL (ref 1.7–7.7)
Neutrophils Relative %: 70 %
Platelets: 209 10*3/uL (ref 150–400)
RBC: 4.4 MIL/uL (ref 3.87–5.11)
RDW: 15 % (ref 11.5–15.5)
WBC: 9.2 10*3/uL (ref 4.0–10.5)
nRBC: 0 % (ref 0.0–0.2)

## 2020-05-30 LAB — COMPREHENSIVE METABOLIC PANEL
ALT: 17 U/L (ref 0–44)
AST: 17 U/L (ref 15–41)
Albumin: 3.9 g/dL (ref 3.5–5.0)
Alkaline Phosphatase: 73 U/L (ref 38–126)
Anion gap: 8 (ref 5–15)
BUN: 15 mg/dL (ref 8–23)
CO2: 25 mmol/L (ref 22–32)
Calcium: 9.5 mg/dL (ref 8.9–10.3)
Chloride: 107 mmol/L (ref 98–111)
Creatinine, Ser: 1.04 mg/dL — ABNORMAL HIGH (ref 0.44–1.00)
GFR, Estimated: 58 mL/min — ABNORMAL LOW (ref >60)
Glucose, Bld: 97 mg/dL (ref 70–99)
Potassium: 4 mmol/L (ref 3.5–5.1)
Sodium: 140 mmol/L (ref 135–145)
Total Bilirubin: 0.8 mg/dL (ref 0.3–1.2)
Total Protein: 7.2 g/dL (ref 6.5–8.1)

## 2020-05-30 NOTE — Progress Notes (Signed)
Patient denies new problems/concerns today.   °

## 2020-05-31 LAB — THYROID PANEL WITH TSH
Free Thyroxine Index: 1.3 (ref 1.2–4.9)
T3 Uptake Ratio: 28 % (ref 24–39)
T4, Total: 4.5 ug/dL (ref 4.5–12.0)
TSH: 1.8 u[IU]/mL (ref 0.450–4.500)

## 2020-05-31 LAB — CEA: CEA: 4 ng/mL (ref 0.0–4.7)

## 2020-06-03 DIAGNOSIS — M19011 Primary osteoarthritis, right shoulder: Secondary | ICD-10-CM | POA: Diagnosis not present

## 2020-06-03 DIAGNOSIS — M13861 Other specified arthritis, right knee: Secondary | ICD-10-CM | POA: Diagnosis not present

## 2020-06-03 DIAGNOSIS — M13862 Other specified arthritis, left knee: Secondary | ICD-10-CM | POA: Diagnosis not present

## 2020-06-03 DIAGNOSIS — M7062 Trochanteric bursitis, left hip: Secondary | ICD-10-CM | POA: Diagnosis not present

## 2020-06-05 ENCOUNTER — Ambulatory Visit: Payer: Medicare HMO | Admitting: Dermatology

## 2020-06-12 DIAGNOSIS — G894 Chronic pain syndrome: Secondary | ICD-10-CM | POA: Diagnosis not present

## 2020-06-20 DIAGNOSIS — M47816 Spondylosis without myelopathy or radiculopathy, lumbar region: Secondary | ICD-10-CM | POA: Diagnosis not present

## 2020-06-25 ENCOUNTER — Other Ambulatory Visit: Payer: Self-pay

## 2020-06-25 ENCOUNTER — Ambulatory Visit (INDEPENDENT_AMBULATORY_CARE_PROVIDER_SITE_OTHER): Payer: Medicare HMO | Admitting: Physician Assistant

## 2020-06-25 DIAGNOSIS — Z23 Encounter for immunization: Secondary | ICD-10-CM

## 2020-06-25 NOTE — Progress Notes (Signed)
Administered covid booster and high dose flu vaccine IM in right deltoid. Patient tolerated injections well with no adverse reactions. Patient was observed in office for 94mins. She refused to wait the 53mins. She was accompanied with her daughter today.

## 2020-06-28 ENCOUNTER — Other Ambulatory Visit: Payer: Self-pay | Admitting: *Deleted

## 2020-06-28 DIAGNOSIS — C184 Malignant neoplasm of transverse colon: Secondary | ICD-10-CM

## 2020-07-01 DIAGNOSIS — M19011 Primary osteoarthritis, right shoulder: Secondary | ICD-10-CM | POA: Diagnosis not present

## 2020-07-01 DIAGNOSIS — M7062 Trochanteric bursitis, left hip: Secondary | ICD-10-CM | POA: Diagnosis not present

## 2020-07-01 DIAGNOSIS — M13862 Other specified arthritis, left knee: Secondary | ICD-10-CM | POA: Diagnosis not present

## 2020-07-01 DIAGNOSIS — M13861 Other specified arthritis, right knee: Secondary | ICD-10-CM | POA: Diagnosis not present

## 2020-07-04 ENCOUNTER — Ambulatory Visit
Admission: RE | Admit: 2020-07-04 | Discharge: 2020-07-04 | Disposition: A | Discharge status: Home / Self Care | Payer: Medicare HMO | Source: Ambulatory Visit | Attending: Oncology | Admitting: Oncology

## 2020-07-04 ENCOUNTER — Other Ambulatory Visit: Payer: Self-pay

## 2020-07-04 DIAGNOSIS — Z1231 Encounter for screening mammogram for malignant neoplasm of breast: Secondary | ICD-10-CM | POA: Insufficient documentation

## 2020-07-04 DIAGNOSIS — Z853 Personal history of malignant neoplasm of breast: Secondary | ICD-10-CM | POA: Diagnosis not present

## 2020-07-04 DIAGNOSIS — C184 Malignant neoplasm of transverse colon: Secondary | ICD-10-CM

## 2020-07-04 DIAGNOSIS — C50912 Malignant neoplasm of unspecified site of left female breast: Secondary | ICD-10-CM

## 2020-07-09 ENCOUNTER — Ambulatory Visit: Payer: Medicare HMO | Admitting: Oncology

## 2020-07-09 ENCOUNTER — Other Ambulatory Visit: Payer: Medicare HMO

## 2020-07-13 DIAGNOSIS — G894 Chronic pain syndrome: Secondary | ICD-10-CM | POA: Diagnosis not present

## 2020-07-17 DIAGNOSIS — M47816 Spondylosis without myelopathy or radiculopathy, lumbar region: Secondary | ICD-10-CM | POA: Diagnosis not present

## 2020-07-26 DIAGNOSIS — J309 Allergic rhinitis, unspecified: Secondary | ICD-10-CM | POA: Diagnosis not present

## 2020-07-26 DIAGNOSIS — I10 Essential (primary) hypertension: Secondary | ICD-10-CM | POA: Diagnosis not present

## 2020-07-26 DIAGNOSIS — G629 Polyneuropathy, unspecified: Secondary | ICD-10-CM | POA: Diagnosis not present

## 2020-07-26 DIAGNOSIS — J42 Unspecified chronic bronchitis: Secondary | ICD-10-CM | POA: Diagnosis not present

## 2020-07-26 DIAGNOSIS — I951 Orthostatic hypotension: Secondary | ICD-10-CM | POA: Diagnosis not present

## 2020-07-26 DIAGNOSIS — G8929 Other chronic pain: Secondary | ICD-10-CM | POA: Diagnosis not present

## 2020-07-26 DIAGNOSIS — R69 Illness, unspecified: Secondary | ICD-10-CM | POA: Diagnosis not present

## 2020-07-26 DIAGNOSIS — E785 Hyperlipidemia, unspecified: Secondary | ICD-10-CM | POA: Diagnosis not present

## 2020-08-01 DIAGNOSIS — M13862 Other specified arthritis, left knee: Secondary | ICD-10-CM | POA: Diagnosis not present

## 2020-08-01 DIAGNOSIS — M7062 Trochanteric bursitis, left hip: Secondary | ICD-10-CM | POA: Diagnosis not present

## 2020-08-01 DIAGNOSIS — M19011 Primary osteoarthritis, right shoulder: Secondary | ICD-10-CM | POA: Diagnosis not present

## 2020-08-01 DIAGNOSIS — M13861 Other specified arthritis, right knee: Secondary | ICD-10-CM | POA: Diagnosis not present

## 2020-08-06 ENCOUNTER — Telehealth (INDEPENDENT_AMBULATORY_CARE_PROVIDER_SITE_OTHER): Payer: Medicare HMO | Admitting: Physician Assistant

## 2020-08-06 DIAGNOSIS — R059 Cough, unspecified: Secondary | ICD-10-CM | POA: Diagnosis not present

## 2020-08-06 DIAGNOSIS — J40 Bronchitis, not specified as acute or chronic: Secondary | ICD-10-CM

## 2020-08-06 DIAGNOSIS — M47816 Spondylosis without myelopathy or radiculopathy, lumbar region: Secondary | ICD-10-CM | POA: Diagnosis not present

## 2020-08-06 MED ORDER — ALBUTEROL SULFATE HFA 108 (90 BASE) MCG/ACT IN AERS: 2.0000 | INHALATION_SPRAY | Freq: Four times a day (QID) | RESPIRATORY_TRACT | 0 refills | Status: DC | PRN

## 2020-08-06 MED ORDER — DOXYCYCLINE HYCLATE 100 MG PO TABS: 100.0000 mg | ORAL_TABLET | Freq: Two times a day (BID) | ORAL | 0 refills | Status: AC

## 2020-08-06 MED ORDER — PREDNISONE 20 MG PO TABS: 20.0000 mg | ORAL_TABLET | Freq: Every day | ORAL | 0 refills | Status: DC

## 2020-08-06 NOTE — Progress Notes (Signed)
MyChart Video Visit    Virtual Visit via Video Note   This visit type was conducted due to national recommendations for restrictions regarding the COVID-19 Pandemic (e.g. social distancing) in an effort to limit this patient's exposure and mitigate transmission in our community. This patient is at least at moderate risk for complications without adequate follow up. This format is felt to be most appropriate for this patient at this time. Physical exam was limited by quality of the video and audio technology used for the visit.   Patient location: Home Provider location: Office   I discussed the limitations of evaluation and management by telemedicine and the availability of in person appointments. The patient expressed understanding and agreed to proceed.  Patient: Carrie Alvarez   DOB: Dec 01, 1950   70 y.o. Female  MRN: [id number] Visit Date: 08/06/2020  Today's healthcare provider: Trinna Post, PA-C   Chief Complaint  Patient presents with  . Sinus Problem  I,Porsha C McClurkin,acting as a scribe for Trinna Post, PA-C.,have documented all relevant documentation on the behalf of Trinna Post, PA-C,as directed by  Trinna Post, PA-C while in the presence of Trinna Post, PA-C.  Subjective    Sinus Problem This is a new problem. The current episode started in the past 7 days. The problem has been gradually worsening since onset. There has been no fever. Her pain is at a severity of 0/10. She is experiencing no pain. Associated symptoms include congestion, coughing, a hoarse voice, shortness of breath, sinus pressure and sneezing. Pertinent negatives include no chills, ear pain, headaches or sore throat. Past treatments include oral decongestants. The treatment provided no relief.    Patient with history of lynch syndrome and multiple cancers, most recently colon cancer reports having cough and shortness of breath that have been present for 5 days. She does  have three vaccinations against COVID. She denies fevers. She is having some shortness of breath. She had a procedure this morning at [facility] and her O2 sats there were 97%. Has been having some wheezing and is using an albuterol inhaler PRN, which is helpful.     Medications: Outpatient Medications Prior to Visit  Medication Sig  . acetaminophen (TYLENOL) 500 MG tablet Take 500 mg by mouth every 6 (six) hours as needed for mild pain or moderate pain.   Marland Kitchen albuterol (VENTOLIN HFA) 108 (90 Base) MCG/ACT inhaler Inhale 2 puffs into the lungs every 6 (six) hours as needed for wheezing or shortness of breath.  Marland Kitchen amLODipine (NORVASC) 5 MG tablet Take 1 tablet (5 mg total) by mouth daily. MD re-ordered at decreased dose  . atorvastatin (LIPITOR) 10 MG tablet TAKE 1 TABLET BY MOUTH IN THE MORNING  . baclofen (LIORESAL) 10 MG tablet Take 1 tablet (10 mg total) by mouth 2 (two) times daily.  . cholecalciferol (VITAMIN D) 1000 units tablet Take 1,000 Units by mouth daily.  . fluticasone (FLONASE) 50 MCG/ACT nasal spray Place 2 sprays into both nostrils daily.  Marland Kitchen gabapentin (NEURONTIN) 300 MG capsule Take 300-600 mg by mouth 4 (four) times daily as needed (neuropathic pain).   Marland Kitchen letrozole (FEMARA) 2.5 MG tablet Take 2.5 mg by mouth daily.   Marland Kitchen NARCAN 4 MG/0.1ML LIQD nasal spray kit Place 1 spray into the nose.   Marland Kitchen Oxycodone HCl 10 MG TABS LIMIT ONE HALF TO ONE TABLET BY MOUTH 3 TO 5 TIMES DAILY IF TOLERATED  . pyridoxine (B-6) 100 MG tablet Take 100  mg by mouth daily.  . sertraline (ZOLOFT) 100 MG tablet Take 2 tablets by mouth once daily  . vitamin B-12 (CYANOCOBALAMIN) 500 MCG tablet Take 500 mcg by mouth daily.  . predniSONE (DELTASONE) 20 MG tablet Take 1 tablet (20 mg total) by mouth daily with breakfast. (Patient not taking: Reported on 08/06/2020)   Facility-Administered Medications Prior to Visit  Medication Dose Route Frequency Provider  . heparin lock flush 100 unit/mL  500 Units Intravenous  Once [name], MD  . heparin lock flush 100 unit/mL  500 Units Intravenous Once [name], MD  . sodium chloride flush (NS) 0.9 % injection 10 mL  10 mL Intravenous PRN [name], MD    Review of Systems  Constitutional: Negative for appetite change, chills, fatigue and fever.  HENT: Positive for congestion, hoarse voice, rhinorrhea, sinus pressure, sinus pain and sneezing. Negative for ear pain, postnasal drip and sore throat.   Respiratory: Positive for cough, chest tightness, shortness of breath and wheezing.   Cardiovascular: Negative for chest pain.  Gastrointestinal: Negative for diarrhea, nausea and vomiting.  Neurological: Negative for weakness and headaches.      Objective    There were no vitals taken for this visit.   Physical Exam Constitutional:      Appearance: Normal appearance.  Pulmonary:     Effort: Pulmonary effort is normal. No respiratory distress.  Neurological:     Mental Status: She is alert.  Psychiatric:        Mood and Affect: Mood normal.        Behavior: Behavior normal.        Assessment & Plan     1. Cough  Concern for COVID, flu, bronchitis, PNA. Will have her come to clinic tomorrow for outpatient testing and CXR. In the mean time, start prednisone and continue inhaler. Hold off on antibiotics until xray returns. Return precautions counseled.   - COVID-19, Flu A+B and RSV - DG Chest 2 View; Future - predniSONE (DELTASONE) 20 MG tablet; Take 1 tablet (20 mg total) by mouth daily with breakfast.  Dispense: 5 tablet; Refill: 0 - doxycycline (VIBRA-TABS) 100 MG tablet; Take 1 tablet (100 mg total) by mouth 2 (two) times daily for 7 days.  Dispense: 14 tablet; Refill: 0  2. Bronchitis  - COVID-19, Flu A+B and RSV - predniSONE (DELTASONE) 20 MG tablet; Take 1 tablet (20 mg total) by mouth daily with breakfast.  Dispense: 5 tablet; Refill: 0 - doxycycline (VIBRA-TABS) 100 MG tablet; Take 1 tablet (100 mg total)  by mouth 2 (two) times daily for 7 days.  Dispense: 14 tablet; Refill: 0 - albuterol (VENTOLIN HFA) 108 (90 Base) MCG/ACT inhaler; Inhale 2 puffs into the lungs every 6 (six) hours as needed for wheezing or shortness of breath.  Dispense: 6.7 g; Refill: 0   No follow-ups on file.     I discussed the assessment and treatment plan with the patient. The patient was provided an opportunity to ask questions and all were answered. The patient agreed with the plan and demonstrated an understanding of the instructions.   The patient was advised to call back or seek an in-person evaluation if the symptoms worsen or if the condition fails to improve as anticipated.   ITrinna Post, PA-C, have reviewed all documentation for this visit. The documentation on 08/06/20 for the exam, diagnosis, procedures, and orders are all accurate and complete.  The entirety of the information documented in the History  of Present Illness, Review of Systems and Physical Exam were personally obtained by me. Portions of this information were initially documented by [facility] and reviewed by me for thoroughness and accuracy.    Paulene Floor [facility] [phone] (phone) [phone] (fax)  [place]

## 2020-08-07 ENCOUNTER — Ambulatory Visit
Admission: RE | Admit: 2020-08-07 | Discharge: 2020-08-07 | Disposition: A | Discharge status: Home / Self Care | Payer: Medicare HMO | Source: Ambulatory Visit | Attending: Physician Assistant | Admitting: Physician Assistant

## 2020-08-07 DIAGNOSIS — R059 Cough, unspecified: Secondary | ICD-10-CM | POA: Insufficient documentation

## 2020-08-07 DIAGNOSIS — J40 Bronchitis, not specified as acute or chronic: Secondary | ICD-10-CM | POA: Diagnosis not present

## 2020-08-09 LAB — COVID-19, FLU A+B AND RSV
Influenza A, NAA: NOT DETECTED
Influenza B, NAA: NOT DETECTED
RSV, NAA: NOT DETECTED
SARS-CoV-2, NAA: NOT DETECTED

## 2020-08-13 DIAGNOSIS — M542 Cervicalgia: Secondary | ICD-10-CM | POA: Diagnosis not present

## 2020-08-13 DIAGNOSIS — G894 Chronic pain syndrome: Secondary | ICD-10-CM | POA: Diagnosis not present

## 2020-08-13 DIAGNOSIS — M545 Low back pain, unspecified: Secondary | ICD-10-CM | POA: Diagnosis not present

## 2020-08-26 DIAGNOSIS — G894 Chronic pain syndrome: Secondary | ICD-10-CM | POA: Diagnosis not present

## 2020-08-26 DIAGNOSIS — M545 Low back pain, unspecified: Secondary | ICD-10-CM | POA: Diagnosis not present

## 2020-08-26 DIAGNOSIS — M542 Cervicalgia: Secondary | ICD-10-CM | POA: Diagnosis not present

## 2020-08-28 DIAGNOSIS — M47816 Spondylosis without myelopathy or radiculopathy, lumbar region: Secondary | ICD-10-CM | POA: Diagnosis not present

## 2020-09-01 NOTE — Progress Notes (Signed)
[place]  Telephone:([phone]) [phone] [phone]) [phone]  ID: Norm Salt OB: Sep 24, 1950  MR#: [id number]  YOK#:[id number]  Patient Care Team: Paulene Floor as PCP - General (Physician Assistant) [name], MD as Consulting Physician (Oncology) [name], MD as Attending Physician (Pain Medicine) [name]., MD as Consulting Physician (Ophthalmology) [name], RN as Oncology Nurse Navigator Normajean Glasgow, MD as Attending Physician (Physical Medicine and Rehabilitation)  CHIEF COMPLAINT: Stage IIa ER+ left breast cancer with no breast lesion and axillary lymph node metastasis.  Now with stage IIIa colon cancer.  INTERVAL HISTORY: Patient returns to clinic today for routine 63-monthevaluation.  She is having significant back and leg pain today, but otherwise feels well.  She denies any weakness or fatigue.  She does not complain of peripheral neuropathy and has no neurologic complaints.  She denies any recent fevers or illnesses.  She has no chest pain, shortness of breath, cough, or hemoptysis.  She denies any nausea, vomiting, constipation, or diarrhea. She has no melena or hematochezia.  She has no urinary complaints.  Patient offers no further specific complaints today.  REVIEW OF SYSTEMS:   Review of Systems  Constitutional: Negative.  Negative for fever, malaise/fatigue and weight loss.  Respiratory: Negative.  Negative for cough and shortness of breath.   Cardiovascular: Negative.  Negative for chest pain and leg swelling.  Gastrointestinal: Negative.  Negative for abdominal pain, blood in stool, constipation, diarrhea, melena, nausea and vomiting.  Genitourinary: Negative.  Negative for dysuria.  Musculoskeletal: Positive for back pain. Negative for neck pain.  Skin: Negative.  Negative for rash.  Neurological: Negative.  Negative for dizziness, tingling, sensory change, focal weakness, weakness and headaches.   Psychiatric/Behavioral: Negative.  Negative for depression. The patient is not nervous/anxious.     As per HPI. Otherwise, a complete review of systems is negative.  PAST MEDICAL HISTORY: Past Medical History:  Diagnosis Date  . Allergy   . Anemia   . Back pain   . Breast cancer ([facility] [place]   LT LUMPECTOMY 12-2014 FOLLOWING CHEMO  . Carpal tunnel syndrome    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  . Cataract   . Chronic pain   . [place] [place]  . Depression   . Dyspnea   . Family history of breast cancer   . Family history of colon cancer   . GERD (gastroesophageal reflux disease)    problem with reflux during chemo  . History of methicillin resistant staphylococcus aureus (MRSA)   . Hypercholesteremia   . Hypertension   . Lumbosacral pain    sees pain management in [place]  . Obesity   . Personal history of chemotherapy 2016   left breast ca  . Personal history of chemotherapy 2020   colon ca  . Pinched nerve    left side, lumbar area  . [place]ine cancer ([facility] [place]    PAST SURGICAL HISTORY: Past Surgical History:  Procedure Laterality Date  . ABDOMINAL HYSTERECTOMY  1994   UTERINE CA  . AXILLARY LYMPH NODE BIOPSY Left 12/18/2014   Procedure: AXILLARY LYMPH NODE BIOPSY/;  Surgeon: JRobert Bellow MD;  Location: [facility] OR[facility];  Service: General;  Laterality: Left;  . AXILLARY LYMPH NODE DISSECTION Left 12/18/2014   Procedure: AXILLARY LYMPH NODE DISSECTION;  Surgeon: JRobert Bellow MD;  Location: [facility];  Service: General;  Laterality: Left;  . BREAST BIOPSY Left 05/2014   CORE BX OF LN, METASTATIC  ADENOCARCINOMA  . BREAST LUMPECTOMY Left 12/2014   CHEMO FIRST THEN SURGERY OF LN  . BREAST SURGERY     lymph node removal  . CHOLECYSTECTOMY N/A 04/19/2016   Procedure: LAPAROSCOPIC CHOLECYSTECTOMY WITH INTRAOPERATIVE CHOLANGIOGRAM;  Surgeon: Hubbard Robinson, MD;  Location: [facility] OR[facility];  Service: General;  Laterality: N/A;  . COLON RESECTION Right  01/10/2019   Procedure: HAND ASSISTED LAPAROSCOPIC RIGHT COLON RESECTION;  Surgeon: Jules Husbands, MD;  Location: [facility];  Service: General;  Laterality: Right;  . COLONOSCOPY WITH PROPOFOL N/A 12/30/2018   Procedure: COLONOSCOPY WITH PROPOFOL;  Surgeon: Lucilla Lame, MD;  Location: [place];  Service: Endoscopy;  Laterality: N/A;  . COLONOSCOPY WITH PROPOFOL N/A 03/05/2020   Procedure: COLONOSCOPY WITH PROPOFOL;  Surgeon: Lucilla Lame, MD;  Location: [facility] ENDOSCOPY;  Service: Endoscopy;  Laterality: N/A;  . medial branch block  11/06/2015   lumbar facet Dr. Primus Bravo  . POLYPECTOMY N/A 12/30/2018   Procedure: POLYPECTOMY;  Surgeon: Lucilla Lame, MD;  Location: [place];  Service: Endoscopy;  Laterality: N/A;  Clips placed at Hepatic Flexure Polyp (2) and Transverse Colon Polyp (4) removal sites  . PORTACATH PLACEMENT Right 02/02/2019   Procedure: INSERTION PORT-A-CATH;  Surgeon: Jules Husbands, MD;  Location: [facility];  Service: General;  Laterality: Right;  . SENTINEL NODE BIOPSY Left 12/18/2014   Procedure: SENTINEL NODE BIOPSY;  Surgeon: Robert Bellow, MD;  Location: [facility];  Service: General;  Laterality: Left;    FAMILY HISTORY Family History  Problem Relation Age of Onset  . Breast cancer Sister 28  . Colon cancer Sister   . Colon cancer Mother        dx 61s  . Hypertension Mother   . Asthma Mother   . Cancer Father        voice box removed  . Cancer Maternal Grandmother        unsure type  . Colon cancer Cousin   . Cancer Cousin        unsure type       ADVANCED DIRECTIVES:    HEALTH MAINTENANCE: Social History   Tobacco Use  . Smoking status: Former Smoker    Packs/day: 0.50    Types: Cigarettes    Quit date: 07/18/2014    Years since quitting: 6.1  . Smokeless tobacco: Never Used  Vaping Use  . Vaping Use: Never used  Substance Use Topics  . Alcohol use: No    Alcohol/week: 0.0 standard drinks  . Drug use: No    No Known  Allergies  Current Outpatient Medications  Medication Sig Dispense Refill  . acetaminophen (TYLENOL) 500 MG tablet Take 500 mg by mouth every 6 (six) hours as needed for mild pain or moderate pain.     Marland Kitchen albuterol (VENTOLIN HFA) 108 (90 Base) MCG/ACT inhaler Inhale 2 puffs into the lungs every 6 (six) hours as needed for wheezing or shortness of breath. 6.7 g 0  . amLODipine (NORVASC) 5 MG tablet Take 1 tablet (5 mg total) by mouth daily. MD re-ordered at decreased dose 90 tablet 1  . atorvastatin (LIPITOR) 10 MG tablet TAKE 1 TABLET BY MOUTH IN THE MORNING 90 tablet 2  . baclofen (LIORESAL) 10 MG tablet Take 1 tablet (10 mg total) by mouth 2 (two) times daily. 30 each 0  . cholecalciferol (VITAMIN D) 1000 units tablet Take 1,000 Units by mouth daily.    . fluticasone (FLONASE) 50 MCG/ACT nasal spray Place 2 sprays into both  nostrils daily. 16 g 2  . gabapentin (NEURONTIN) 300 MG capsule Take 300-600 mg by mouth 4 (four) times daily as needed (neuropathic pain).     Marland Kitchen letrozole (FEMARA) 2.5 MG tablet Take 2.5 mg by mouth daily.     Marland Kitchen NARCAN 4 MG/0.1ML LIQD nasal spray kit Place 1 spray into the nose.     Marland Kitchen Oxycodone HCl 10 MG TABS LIMIT ONE HALF TO ONE TABLET BY MOUTH 3 TO 5 TIMES DAILY IF TOLERATED    . predniSONE (DELTASONE) 20 MG tablet Take 1 tablet (20 mg total) by mouth daily with breakfast. 5 tablet 0  . pyridoxine (B-6) 100 MG tablet Take 100 mg by mouth daily.    . sertraline (ZOLOFT) 100 MG tablet Take 2 tablets by mouth once daily 180 tablet 1  . vitamin B-12 (CYANOCOBALAMIN) 500 MCG tablet Take 500 mcg by mouth daily.     No current facility-administered medications for this visit.   Facility-Administered Medications Ordered in Other Visits  Medication Dose Route Frequency Provider Last Rate Last Admin  . heparin lock flush 100 unit/mL  500 Units Intravenous Once [name], MD      . heparin lock flush 100 unit/mL  500 Units Intravenous Once [name], MD       . sodium chloride flush (NS) 0.9 % injection 10 mL  10 mL Intravenous PRN [name], MD   10 mL at 02/27/19 0925    OBJECTIVE: Vitals:   09/05/20 1008  BP: 125/86  Pulse: 83  Resp: 18  Temp: 97.6 F (36.4 C)  SpO2: 98%     Body mass index is 36.14 kg/m.    ECOG FS:1 - Symptomatic but completely ambulatory  General: Well-developed, well-nourished, no acute distress. Eyes: Pink conjunctiva, anicteric sclera. HEENT: Normocephalic, moist mucous membranes. Lungs: No audible wheezing or coughing. Heart: Regular rate and rhythm. Abdomen: Soft, nontender, no obvious distention. Musculoskeletal: No edema, cyanosis, or clubbing. Neuro: Alert, answering all questions appropriately. Cranial nerves grossly intact. Skin: No rashes or petechiae noted. Psych: Normal affect.   LAB RESULTS:  Lab Results  Component Value Date   NA 140 09/05/2020   K 4.1 09/05/2020   CL 103 09/05/2020   CO2 27 09/05/2020   GLUCOSE 93 09/05/2020   BUN 11 09/05/2020   CREATININE 1.30 (H) 09/05/2020   CALCIUM 9.5 09/05/2020   PROT 7.6 09/05/2020   ALBUMIN 4.1 09/05/2020   AST 19 09/05/2020   ALT 16 09/05/2020   ALKPHOS 77 09/05/2020   BILITOT 1.2 09/05/2020   GFRNONAA 45 (L) 09/05/2020   GFRAA 57 (L) 02/26/2020    Lab Results  Component Value Date   WBC 8.7 09/05/2020   NEUTROABS 6.0 09/05/2020   HGB 12.7 09/05/2020   HCT 36.4 09/05/2020   MCV 82.9 09/05/2020   PLT 254 09/05/2020     STUDIES: DG Chest 2 View  Result Date: 08/07/2020 CLINICAL DATA:  Cough EXAM: CHEST - 2 VIEW COMPARISON:  02/02/2019 FINDINGS: Tortuosity of the thoracic aorta. Heart is normal size. Lungs clear. No effusions or acute bony abnormality. IMPRESSION: No active cardiopulmonary disease. Electronically Signed   By: Rolm Baptise M.D.   On: 08/07/2020 21:26   CT CHEST ABDOMEN PELVIS W CONTRAST  Result Date: 09/05/2020 CLINICAL DATA:  Colon cancer restaging, status post resection EXAM: CT CHEST, ABDOMEN,  AND PELVIS WITH CONTRAST TECHNIQUE: Multidetector CT imaging of the chest, abdomen and pelvis was performed following the standard protocol during bolus administration  of intravenous contrast. CONTRAST:  135m OMNIPAQUE IOHEXOL 300 MG/ML SOLN, additional oral enteric contrast COMPARISON:  02/20/2020 FINDINGS: CT CHEST FINDINGS Cardiovascular: Aortic atherosclerosis. Normal heart size. No pericardial effusion. Mediastinum/Nodes: No enlarged mediastinal, hilar, or axillary lymph nodes. Thyroid gland, trachea, and esophagus demonstrate no significant findings. Lungs/Pleura: Minimal bandlike scarring of the lung bases. No pleural effusion or pneumothorax. Musculoskeletal: No chest wall mass or suspicious bone lesions identified. CT ABDOMEN PELVIS FINDINGS Hepatobiliary: No focal liver abnormality is seen. Status post cholecystectomy. No biliary dilatation. Pancreas: Unremarkable. No pancreatic ductal dilatation or surrounding inflammatory changes. Spleen: Normal in size without significant abnormality. Adrenals/Urinary Tract: Adrenal glands are unremarkable. Kidneys are normal, without renal calculi, solid lesion, or hydronephrosis. Bladder is unremarkable. Stomach/Bowel: Stomach is within normal limits. Status post right hemicolectomy and reanastomosis. No evidence of bowel wall thickening, distention, or inflammatory changes. Vascular/Lymphatic: Aortic atherosclerosis. No enlarged abdominal or pelvic lymph nodes. Reproductive: Status post hysterectomy. Other: No abdominal wall hernia or abnormality. No abdominopelvic ascites. Continued interval diminishment of a small, ground-glass and fat attenuation nodule in the left paracolic gutter (series 2, image 70). Musculoskeletal: No acute or significant osseous findings. IMPRESSION: 1. Status post right hemicolectomy and reanastomosis. 2. No evidence of metastatic disease in the chest, abdomen, or pelvis. 3. Continued interval diminishment of a small, ground-glass and fat  attenuation nodule in the left paracolic gutter, consistent with a small focus of resolving postoperative fat necrosis. Aortic Atherosclerosis (ICD10-I70.0). Electronically Signed   By: AEddie CandleM.D.   On: 09/05/2020 10:20    ASSESSMENT: Stage IIa ER+ left breast cancer with no breast lesion and axillary lymph node metastasis. (TxN1M0)  HER-2 negative.  Now with stage IIIa colon cancer.  PLAN:   1.  Stage IIIa colon cancer: Pathology report reviewed independently.  Previously, patient agreed to enroll in clinical trial.  Oxaliplatin was discontinued early secondary to worsening neuropathy.  Patient completed 12 cycles of chemotherapy on July 31, 2019 and then completed maintenance Tecentriq on January 29, 2020.  Restaging CT scan results on September 05, 2020 reviewed independently and report as above with no obvious evidence of recurrent or metastatic disease.  Colonoscopy on March 05, 2020 which only revealed a 4 mm benign polyp.  Because she has Lynch syndrome, this should be repeated in 1 year.  Return to clinic in 3 months for laboratory work only and then in 6 months for laboratory work and further evaluation.  2. Stage IIa ER+ left breast cancer with no breast lesion and axillary lymph node metastasis: Despite no obvious breast lesion on mammogram or breast MRI, pathology and pattern of spread was consistent with primary breast cancer. CT and bone scan at time of diagnosis revealed no other evidence of malignancy.  She only received 3 cycles of Adriamycin and Cytoxan. Taxol was discontinued early as well secondary to persistent peripheral neuropathy. Patient completed her chemotherapy on Nov 19, 2014.  She elected not to pursue axillary node dissection given the potential morbidity of this procedure. It was also elected not to pursue adjuvant XRT given there was no primary breast lesion.  Patient completed approximately 4 years of treatment with letrozole, but this was discontinued secondary to  enrolling in clinical trial for her newly diagnosed colon cancer.  Her most recent mammogram on July 04, 2019 was reported as BI-RADS 1.  Repeat mammogram in December 2021.  Follow-up as above.  3.  Bone health: Patient's most recent bone mineral density on February 03, 2018 reported T score  of -0.7 which is unchanged from 3 years prior.  Continue monitoring and evaluation per primary care.  4.  Lynch syndrome: Genetic testing confirmed patient has Lynch syndrome.  Likely maternal patient reports her mother had colon cancer at a very young age.  She reports her sister and son both have tested positive.  She reports her other children are not interested in genetic testing.  Appreciate genetics input.  Continue yearly colonoscopies as above.  5.  Endometrial cancer: Patient has had a total hysterectomy. 6.  Back pain/sciatica: Significantly worse today.  Continue monthly follow-up with pain clinic as scheduled.  Patient reports insurance will only allow 3 injections every 6 months.   7.  Peripheral neuropathy: Patient does not complain of this today.  Continue gabapentin 600 mg 4 times per day.  Continue follow-up with pain clinic as scheduled.  8.  Anemia: Resolved. 9.  Renal insufficiency: Slightly worse today, monitor. 10.  Hypokalemia: Resolved.  Continue oral potassium supplementation.  11.  Diarrhea: Resolved.  Continue Imodium and Lomotil as needed. 12.  Fatigue: Resolved. 13.  Hypotension: Resolved.  Continue Norvasc 5 mg daily. 14.  Port associated clot: Patient has now completed greater than 3 months of Eliquis and has discontinued treatment.  Port has been removed. 15: Left leg lesion: Patient was previously given a referral to dermatology.  Patient expressed understanding and was in agreement with this plan. She also understands that She can call clinic at any time with any questions, concerns, or complaints.   Breast cancer metastasized to axillary lymph node   Staging form: Breast,  AJCC 7th Edition     Clinical stage from 10/22/2014: Stage Unknown (TX, N1, M0) - Signed by [name], MD on 10/22/2014   [name], MD   09/06/2020 5:56 AM

## 2020-09-04 ENCOUNTER — Other Ambulatory Visit: Payer: Self-pay

## 2020-09-04 ENCOUNTER — Ambulatory Visit
Admission: RE | Admit: 2020-09-04 | Discharge: 2020-09-04 | Disposition: A | Discharge status: Home / Self Care | Payer: Medicare HMO | Source: Ambulatory Visit | Attending: Oncology | Admitting: Oncology

## 2020-09-04 DIAGNOSIS — Z9049 Acquired absence of other specified parts of digestive tract: Secondary | ICD-10-CM | POA: Diagnosis not present

## 2020-09-04 DIAGNOSIS — C184 Malignant neoplasm of transverse colon: Secondary | ICD-10-CM

## 2020-09-04 DIAGNOSIS — R911 Solitary pulmonary nodule: Secondary | ICD-10-CM | POA: Diagnosis not present

## 2020-09-04 DIAGNOSIS — C189 Malignant neoplasm of colon, unspecified: Secondary | ICD-10-CM | POA: Diagnosis not present

## 2020-09-04 DIAGNOSIS — J984 Other disorders of lung: Secondary | ICD-10-CM | POA: Diagnosis not present

## 2020-09-04 DIAGNOSIS — I700 Atherosclerosis of aorta: Secondary | ICD-10-CM | POA: Diagnosis not present

## 2020-09-04 DIAGNOSIS — Z90710 Acquired absence of both cervix and uterus: Secondary | ICD-10-CM | POA: Diagnosis not present

## 2020-09-04 LAB — POCT I-STAT CREATININE: Creatinine, Ser: 1.2 mg/dL — ABNORMAL HIGH (ref 0.44–1.00)

## 2020-09-04 MED ORDER — IOHEXOL 300 MG/ML  SOLN
100.0000 mL | Freq: Once | INTRAMUSCULAR | Status: AC | PRN
  Administered 2020-09-04: 100 mL via INTRAVENOUS

## 2020-09-05 ENCOUNTER — Inpatient Hospital Stay: Payer: Medicare HMO | Attending: Oncology | Admitting: Oncology

## 2020-09-05 ENCOUNTER — Inpatient Hospital Stay: Payer: Medicare HMO

## 2020-09-05 ENCOUNTER — Encounter: Payer: Self-pay | Admitting: Oncology

## 2020-09-05 ENCOUNTER — Other Ambulatory Visit: Payer: Self-pay

## 2020-09-05 VITALS — BP 125/86 | HR 83 | Temp 97.6°F | Resp 18 | Ht 63.0 in | Wt 204.0 lb

## 2020-09-05 DIAGNOSIS — M549 Dorsalgia, unspecified: Secondary | ICD-10-CM | POA: Insufficient documentation

## 2020-09-05 DIAGNOSIS — C184 Malignant neoplasm of transverse colon: Secondary | ICD-10-CM

## 2020-09-05 DIAGNOSIS — Z85038 Personal history of other malignant neoplasm of large intestine: Secondary | ICD-10-CM | POA: Diagnosis present

## 2020-09-05 DIAGNOSIS — C189 Malignant neoplasm of colon, unspecified: Secondary | ICD-10-CM

## 2020-09-05 DIAGNOSIS — L989 Disorder of the skin and subcutaneous tissue, unspecified: Secondary | ICD-10-CM | POA: Insufficient documentation

## 2020-09-05 DIAGNOSIS — Z853 Personal history of malignant neoplasm of breast: Secondary | ICD-10-CM | POA: Insufficient documentation

## 2020-09-05 DIAGNOSIS — Z170 Estrogen receptor positive status [ER+]: Secondary | ICD-10-CM | POA: Insufficient documentation

## 2020-09-05 DIAGNOSIS — T451X5A Adverse effect of antineoplastic and immunosuppressive drugs, initial encounter: Secondary | ICD-10-CM | POA: Insufficient documentation

## 2020-09-05 DIAGNOSIS — M5430 Sciatica, unspecified side: Secondary | ICD-10-CM | POA: Insufficient documentation

## 2020-09-05 DIAGNOSIS — G620 Drug-induced polyneuropathy: Secondary | ICD-10-CM | POA: Insufficient documentation

## 2020-09-05 DIAGNOSIS — Z8542 Personal history of malignant neoplasm of other parts of uterus: Secondary | ICD-10-CM | POA: Insufficient documentation

## 2020-09-05 DIAGNOSIS — Z1509 Genetic susceptibility to other malignant neoplasm: Secondary | ICD-10-CM | POA: Insufficient documentation

## 2020-09-05 DIAGNOSIS — N289 Disorder of kidney and ureter, unspecified: Secondary | ICD-10-CM | POA: Insufficient documentation

## 2020-09-05 DIAGNOSIS — Z006 Encounter for examination for normal comparison and control in clinical research program: Secondary | ICD-10-CM | POA: Diagnosis not present

## 2020-09-05 DIAGNOSIS — Z9221 Personal history of antineoplastic chemotherapy: Secondary | ICD-10-CM | POA: Insufficient documentation

## 2020-09-05 LAB — CBC WITH DIFFERENTIAL/PLATELET
Abs Immature Granulocytes: 0.03 10*3/uL (ref 0.00–0.07)
Basophils Absolute: 0.1 10*3/uL (ref 0.0–0.1)
Basophils Relative: 1 %
Eosinophils Absolute: 0.4 10*3/uL (ref 0.0–0.5)
Eosinophils Relative: 4 %
HCT: 36.4 % (ref 36.0–46.0)
Hemoglobin: 12.7 g/dL (ref 12.0–15.0)
Immature Granulocytes: 0 %
Lymphocytes Relative: 19 %
Lymphs Abs: 1.7 10*3/uL (ref 0.7–4.0)
MCH: 28.9 pg (ref 26.0–34.0)
MCHC: 34.9 g/dL (ref 30.0–36.0)
MCV: 82.9 fL (ref 80.0–100.0)
Monocytes Absolute: 0.6 10*3/uL (ref 0.1–1.0)
Monocytes Relative: 7 %
Neutro Abs: 6 10*3/uL (ref 1.7–7.7)
Neutrophils Relative %: 69 %
Platelets: 254 10*3/uL (ref 150–400)
RBC: 4.39 MIL/uL (ref 3.87–5.11)
RDW: 14.6 % (ref 11.5–15.5)
WBC: 8.7 10*3/uL (ref 4.0–10.5)
nRBC: 0 % (ref 0.0–0.2)

## 2020-09-05 LAB — COMPREHENSIVE METABOLIC PANEL
ALT: 16 U/L (ref 0–44)
AST: 19 U/L (ref 15–41)
Albumin: 4.1 g/dL (ref 3.5–5.0)
Alkaline Phosphatase: 77 U/L (ref 38–126)
Anion gap: 10 (ref 5–15)
BUN: 11 mg/dL (ref 8–23)
CO2: 27 mmol/L (ref 22–32)
Calcium: 9.5 mg/dL (ref 8.9–10.3)
Chloride: 103 mmol/L (ref 98–111)
Creatinine, Ser: 1.3 mg/dL — ABNORMAL HIGH (ref 0.44–1.00)
GFR, Estimated: 45 mL/min — ABNORMAL LOW (ref >60)
Glucose, Bld: 93 mg/dL (ref 70–99)
Potassium: 4.1 mmol/L (ref 3.5–5.1)
Sodium: 140 mmol/L (ref 135–145)
Total Bilirubin: 1.2 mg/dL (ref 0.3–1.2)
Total Protein: 7.6 g/dL (ref 6.5–8.1)

## 2020-09-05 NOTE — Research (Addendum)
Patient Carrie Alvarez presents to clinic this morning for her follow up visit on the [id number] [name] study. She had her CT of chest/abdomen and pelvis on 09/04/2020. The patient denies having any abdominal pain, constipation, nausea, fatigue, loss of appetite, fever or UTI's. She does report having a cough with shortness of breath related to her bronchitis she has currently, she has completed her treatments but is still using her inhaler. She continues to report her chronic and worsening back pain, but she is ambulating independently while in clinic this morning and states she has had a procedure done at [facility] with a new doctor last week. She states she refused to use a wheelchair that was offered to her this am, she's just not going to do that. She was told the injections could take 2 days to 2 weeks to take any effect. She states she is on the 2 week side of that now. She also reports managing all of her self-care needs without assistance. Carrie Alvarez remains afebrile and SpO2 was measured at 98% at rest, on room air while while wearing a mask. Her weight remains stable. Patient continues to deny any further abdominal pain. Solicited adverse events were assessed by this RN today. Local and research labs were collected via peripheral vein this morning and CEA  will result in the morning. The patient has refused to provided the research stool specimen for the study at this time. She states she never wants to do that again. Withdrawal of consent for stool specimens only provided for patient review and signature today. The patient did sign the withdrawal of consent for stool specimens only.  All previous abnormal values have now returned to the normal range with the exception of her serum GFR, which is on the low side according to Dr. Grayland Ormond but reassured patient it's not to worry about currently. Dr. Grayland Ormond reviewed the patients recent CT exam and mammogram results with the patient. He states the CT  is excellent and her mammogram is negative so she is doing great. Patient will return to clinic for labs in 3 months per his request, and again in 6 months for her 24 month study follow up and next CT scan. Carrie Alvarez did report a skin area of concern at the corner of her left eye, which is black and irritating. Dr. Grayland Ormond states it is a skin tag and referred  her to follow up with her dermatologist for removal.  Solicited and other Adverse events with grade and attribution are as noted below:   Adverse Event Log  Study/Protocol: [id number] [name] Cycle: 18 Month Visit Event Grade Onset Date Resolved Date Drug Name Attribution Treatment Comments  Diarrhea 0        Constipation 0        Nausea 0     unrelated    Fatigue 1 01/01/2020 09/05/2020  possible    Anorexia 1 07/13/2008 09/05/2020  unrelated    Cough 1 08/26/2020   Unrelated  States due to bronchitis, treatment completed  Dyspnea 1 08/26/2020   unrelated  Due to bronchitis, treatment completed.  Fever 0        UTI 0        Palmer-plantar erythrodesia 0        Total biliruben 0        Neutrophils 0        TSH 0        Sensory Peripheral Neuropathy 2 07/11/2019  09/05/2020   unrelated Gabapentin -  dose recently increased  Denies any neuropathy symptoms.  Anemia 1 01/01/2020  02/26/2020     Recovered: Hgb 12.7 today  Elevated creatinine 1 11/20/2019  09/05/2020  unrelated    Abdominal pain 0          Chronic back & left leg pain 2 07/13/2017   unrelated  Received injections last week per pain management-09/05/2020 SB   Jeral Fruit, RN 09/05/2020 9:54 AM

## 2020-09-06 LAB — CEA: CEA: 3.7 ng/mL (ref 0.0–4.7)

## 2020-09-10 ENCOUNTER — Other Ambulatory Visit: Payer: Medicare HMO

## 2020-09-10 ENCOUNTER — Ambulatory Visit: Payer: Medicare HMO | Admitting: Oncology

## 2020-09-12 DIAGNOSIS — M542 Cervicalgia: Secondary | ICD-10-CM | POA: Diagnosis not present

## 2020-09-12 DIAGNOSIS — G894 Chronic pain syndrome: Secondary | ICD-10-CM | POA: Diagnosis not present

## 2020-09-12 DIAGNOSIS — M545 Low back pain, unspecified: Secondary | ICD-10-CM | POA: Diagnosis not present

## 2020-09-23 DIAGNOSIS — M542 Cervicalgia: Secondary | ICD-10-CM | POA: Diagnosis not present

## 2020-09-23 DIAGNOSIS — M545 Low back pain, unspecified: Secondary | ICD-10-CM | POA: Diagnosis not present

## 2020-09-23 DIAGNOSIS — G894 Chronic pain syndrome: Secondary | ICD-10-CM | POA: Diagnosis not present

## 2020-10-02 NOTE — Progress Notes (Addendum)
Subjective:   Carrie Alvarez is a 70 y.o. female who presents for Medicare Annual (Subsequent) preventive examination.  I connected with Jerrel Ivory today by telephone and verified that I am speaking with the correct person using two identifiers. Location patient: home Location provider: work Persons participating in the virtual visit: patient, provider.   I discussed the limitations, risks, security and privacy concerns of performing an evaluation and management service by telephone and the availability of in person appointments. I also discussed with the patient that there may be a patient responsible charge related to this service. The patient expressed understanding and verbally consented to this telephonic visit.    Interactive audio and video telecommunications were attempted between this provider and patient, however failed, due to patient having technical difficulties OR patient did not have access to video capability.  We continued and completed visit with audio only.   Review of Systems    N/A  Cardiac Risk Factors include: advanced age (>50mn, >>34women);dyslipidemia;hypertension;obesity (BMI >30kg/m2)     Objective:    Today's Vitals   10/03/20 1341  PainSc: 6    There is no height or weight on file to calculate BMI.  Advanced Directives 10/03/2020 05/30/2020 03/05/2020 02/26/2020 01/16/2020 12/15/2019 12/04/2019  Does Patient Have a Medical Advance Directive? _0  No No  Does patient want to make changes to medical advance directive? - - - No - Patient declined No - Patient declined No - Patient declined No - Patient declined  Would patient like information on creating a medical advance directive? No - Patient declined No - Patient declined No - Patient declined - - - -    Current Medications (verified) Outpatient Encounter Medications as of 10/03/2020  Medication Sig   acetaminophen (TYLENOL) 500 MG tablet Take 500 mg by mouth every 6 (six)  hours as needed for mild pain or moderate pain.    albuterol (VENTOLIN HFA) 108 (90 Base) MCG/ACT inhaler Inhale 2 puffs into the lungs every 6 (six) hours as needed for wheezing or shortness of breath.   amLODipine (NORVASC) 5 MG tablet Take 1 tablet (5 mg total) by mouth daily. MD re-ordered at decreased dose   atorvastatin (LIPITOR) 10 MG tablet TAKE 1 TABLET BY MOUTH IN THE MORNING   baclofen (LIORESAL) 10 MG tablet Take 1 tablet (10 mg total) by mouth 2 (two) times daily.   cetirizine (ZYRTEC) 10 MG tablet Take 10 mg by mouth daily at 6 (six) AM.   cholecalciferol (VITAMIN D) 1000 units tablet Take 1,000 Units by mouth daily.   diclofenac Sodium (VOLTAREN) 1 % GEL Apply 4 g topically 4 (four) times daily.   fluticasone (FLONASE) 50 MCG/ACT nasal spray Place 2 sprays into both nostrils daily.   gabapentin (NEURONTIN) 300 MG capsule Take 300-600 mg by mouth 4 (four) times daily as needed (neuropathic pain).    letrozole (FEMARA) 2.5 MG tablet Take 2.5 mg by mouth daily.    NARCAN 4 MG/0.1ML LIQD nasal spray kit Place 1 spray into the nose.    Oxycodone HCl 10 MG TABS LIMIT ONE HALF TO ONE TABLET BY MOUTH 3 TO 5 TIMES DAILY IF TOLERATED   predniSONE (DELTASONE) 20 MG tablet Take 1 tablet (20 mg total) by mouth daily with breakfast.   PREVIDENT 5000 DRY MOUTH 1.1 % GEL dental gel Place onto teeth at bedtime.   pyridoxine (B-6) 100 MG tablet Take 100 mg by mouth daily.   sertraline (ZOLOFT) 100 MG tablet  Take 2 tablets by mouth once daily   vitamin B-12 (CYANOCOBALAMIN) 500 MCG tablet Take 500 mcg by mouth daily.   Facility-Administered Encounter Medications as of 10/03/2020  Medication   heparin lock flush 100 unit/mL   heparin lock flush 100 unit/mL   sodium chloride flush (NS) 0.9 % injection 10 mL    Allergies (verified) Patient has no known allergies.   History: Past Medical History:  Diagnosis Date   Allergy    Anemia    Back pain    Breast cancer ([place]) 2015   LT LUMPECTOMY  12-2014 FOLLOWING CHEMO   Carpal tunnel syndrome    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   Cataract    Chronic pain    Colon cancer ([place]) 01/2019   Depression    Dyspnea    Family history of breast cancer    Family history of colon cancer    GERD (gastroesophageal reflux disease)    problem with reflux during chemo   History of methicillin resistant staphylococcus aureus (MRSA)    Hypercholesteremia    Hypertension    Lumbosacral pain    sees pain management in [place]   Obesity    Personal history of chemotherapy 2016   left breast ca   Personal history of chemotherapy 2020   colon ca   Pinched nerve    left side, lumbar area   Uterine cancer ([place]) 1994   Past Surgical History:  Procedure Laterality Date   ABDOMINAL HYSTERECTOMY  1994   UTERINE CA   AXILLARY LYMPH NODE BIOPSY Left 12/18/2014   Procedure: AXILLARY LYMPH NODE BIOPSY/;  Surgeon: Robert Bellow, MD;  Location: [facility];  Service: General;  Laterality: Left;   AXILLARY LYMPH NODE DISSECTION Left 12/18/2014   Procedure: AXILLARY LYMPH NODE DISSECTION;  Surgeon: Robert Bellow, MD;  Location: [facility];  Service: General;  Laterality: Left;   BREAST BIOPSY Left 05/2014   CORE BX OF LN, METASTATIC ADENOCARCINOMA   BREAST LUMPECTOMY Left 12/2014   CHEMO FIRST THEN SURGERY OF LN   BREAST SURGERY     lymph node removal   CHOLECYSTECTOMY N/A 04/19/2016   Procedure: LAPAROSCOPIC CHOLECYSTECTOMY WITH INTRAOPERATIVE CHOLANGIOGRAM;  Surgeon: Hubbard Robinson, MD;  Location: [facility];  Service: General;  Laterality: N/A;   COLON RESECTION Right 01/10/2019   Procedure: HAND ASSISTED LAPAROSCOPIC RIGHT COLON RESECTION;  Surgeon: Jules Husbands, MD;  Location: [facility];  Service: General;  Laterality: Right;   COLONOSCOPY WITH PROPOFOL N/A 12/30/2018   Procedure: COLONOSCOPY WITH PROPOFOL;  Surgeon: Lucilla Lame, MD;  Location: [facility];  Service: Endoscopy;  Laterality: N/A;   COLONOSCOPY WITH PROPOFOL N/A  03/05/2020   Procedure: COLONOSCOPY WITH PROPOFOL;  Surgeon: Lucilla Lame, MD;  Location: [facility];  Service: Endoscopy;  Laterality: N/A;   medial branch block  11/06/2015   lumbar facet Dr. Primus Bravo   POLYPECTOMY N/A 12/30/2018   Procedure: POLYPECTOMY;  Surgeon: Lucilla Lame, MD;  Location: [facility];  Service: Endoscopy;  Laterality: N/A;  Clips placed at Hepatic Flexure Polyp (2) and Transverse Colon Polyp (4) removal sites   PORTACATH PLACEMENT Right 02/02/2019   Procedure: INSERTION PORT-A-CATH;  Surgeon: Jules Husbands, MD;  Location: [facility];  Service: General;  Laterality: Right;   RADIOFREQUENCY ABLATION     lumbar   SENTINEL NODE BIOPSY Left 12/18/2014   Procedure: SENTINEL NODE BIOPSY;  Surgeon: Robert Bellow, MD;  Location: [facility];  Service: General;  Laterality: Left;  Family History  Problem Relation Age of Onset   Breast cancer Sister 25   Colon cancer Sister    Colon cancer Mother        dx 50s   Hypertension Mother    Asthma Mother    Cancer Father        voice box removed   Cancer Maternal Grandmother        unsure type   Colon cancer Cousin    Cancer Cousin        unsure type   Social History   Socioeconomic History   Marital status: Married    Spouse name: Vicente Serene   Number of children: 3   Years of education: Not on file   Highest education level: Some college, no degree  Occupational History   Occupation: retired    Comment: was on disability  Tobacco Use   Smoking status: Former Smoker    Packs/day: 0.50    Types: Cigarettes    Quit date: 07/18/2014    Years since quitting: 6.2   Smokeless tobacco: Never Used  Vaping Use   Vaping Use: Never used  Substance and Sexual Activity   Alcohol use: No    Alcohol/week: 0.0 standard drinks   Drug use: No   Sexual activity: Not on file  Other Topics Concern   Not on file  Social History Narrative   Husband and patient have not lived together for 10 years.  He is still helpful with  patient.   Social Determinants of Health   Financial Resource Strain: Low Risk    Difficulty of Paying Living Expenses: Not hard at all  Food Insecurity: No Food Insecurity   Worried About Charity fundraiser in the Last Year: Never true   [place] in the Last Year: Never true  Transportation Needs: No Transportation Needs   Lack of Transportation (Medical): No   Lack of Transportation (Non-Medical): No  Physical Activity: Inactive   Days of Exercise per Week: 0 days   Minutes of Exercise per Session: 0 min  Stress: No Stress Concern Present   Feeling of Stress : Not at all  Social Connections: Moderately Integrated   Frequency of Communication with Friends and Family: Once a week   Frequency of Social Gatherings with Friends and Family: More than three times a week   Attends Religious Services: More than 4 times per year   Active Member of [employer] or Organizations: No   Attends Music therapist: Never   Marital Status: Married    Tobacco Counseling Counseling given: Not Answered   Clinical Intake:  Pre-visit preparation completed: Yes  Pain : 0-10 Pain Score: 6  Pain Type: Chronic pain Pain Location:  (whole left side of body) Pain Orientation: Left Pain Descriptors / Indicators: Aching Pain Frequency: Intermittent Pain Relieving Factors: Taking Oxycodone, Baclofen and Gabapentin as needed for pain.  Pain Relieving Factors: Taking Oxycodone, Baclofen and Gabapentin as needed for pain.  Nutritional Risks: Nausea/ vomitting/ diarrhea (Diarrhea occasionally with urges due to partial colon removal.) Diabetes: No  How often do you need to have someone help you when you read instructions, pamphlets, or other written materials from your doctor or pharmacy?: 1 - Never  Diabetic? No  Interpreter Needed?: No  Information entered by :: [facility], LPN   Activities of Daily Living In your present state of health, do you have any difficulty performing  the following activities: 10/03/2020  Hearing? N  Vision? Y  Comment  Has cataracts in both eyes.  Difficulty concentrating or making decisions? N  Walking or climbing stairs? N  Dressing or bathing? N  Doing errands, shopping? N  Preparing Food and eating ? N  Using the Toilet? N  In the past six months, have you accidently leaked urine? N  Do you have problems with loss of bowel control? N  Managing your Medications? N  Managing your Finances? N  Housekeeping or managing your Housekeeping? N  Some recent data might be hidden    Patient Care Team: Chrismon, Vickki Muff, PA-C as PCP - General (Family Medicine) [name], MD as Consulting Physician (Oncology) [name], MD as Attending Physician (Pain Medicine) [name]., MD as Consulting Physician (Ophthalmology) [name], RN as Oncology Nurse Navigator [name], [name], MD as Referring Physician (Anesthesiology)  Indicate any recent Medical Services you may have received from other than [facility] providers in the past year (date may be approximate).     Assessment:   This is a routine wellness examination for Khilynn.  Hearing/Vision screen No exam data present  Dietary issues and exercise activities discussed: Current Exercise Habits: The patient does not participate in regular exercise at present, Exercise limited by: orthopedic condition(s)  Goals      DIET - REDUCE PORTION SIZE     Recommend to decrease portion sizes by eating 3 small healthy meals and at least 2 healthy snacks per day.       Depression Screen PHQ 2/9 Scores 10/03/2020 10/02/2019 09/30/2018 09/30/2018 09/28/2017 10/27/2016 09/22/2016  PHQ - 2 Score 0 0 0 0 1 0 2  PHQ- 9 Score - - 2 - - - 9  Exception Documentation - - - - - - -    Fall Risk Fall Risk  10/03/2020 03/20/2020 10/02/2019 09/11/2019 03/01/2019  Falls in the past year? 0 0 0 0 0  Comment - - - - -  Number falls in past yr: 0 - 0 - -  Injury with Fall? 0 - 0 - -   Risk for fall due to : - - - - -  Follow up - - - - -    [place]:  Any stairs in or around the home? Yes  If so, are there any without handrails? Yes  Home free of loose throw rugs in walkways, pet beds, electrical cords, etc? Yes  Adequate lighting in your home to reduce risk of falls? Yes   ASSISTIVE DEVICES UTILIZED TO PREVENT FALLS:  Life alert? No  Use of a cane, walker or w/c? Yes  Grab bars in the bathroom? No  Shower chair or bench in shower? Yes  Elevated toilet seat or a handicapped toilet? No    Cognitive Function: Normal cognitive status assessed by observation by this Nurse Health Advisor. No abnormalities found.      6CIT Screen 10/02/2019 09/22/2016  What Year? 0 points 0 points  What month? 0 points 0 points  What time? 0 points 0 points  Count back from 20 0 points 0 points  Months in reverse 0 points 0 points  Repeat phrase 0 points 0 points  Total Score 0 0    Immunizations Immunization History  Administered Date(s) Administered   Fluad Quad(high Dose 65+) 03/23/2019, 06/25/2020   Influenza, High Dose Seasonal PF 06/07/2017, 06/07/2018   Influenza,inj,Quad PF,6+ Mos 04/20/2016   PFIZER(Purple Top)SARS-COV-2 Vaccination 09/06/2019, 09/27/2019, 06/25/2020   Pneumococcal Conjugate-13 09/22/2016   Pneumococcal Polysaccharide-23 09/28/2017  TDAP status: Due, Education has been provided regarding the importance of this vaccine. Advised may receive this vaccine at local pharmacy or Health Dept. Aware to provide a copy of the vaccination record if obtained from local pharmacy or Health Dept. Verbalized acceptance and understanding.  Flu Vaccine status: Up to date  Pneumococcal vaccine status: Up to date  Covid-19 vaccine status: Completed vaccines  Qualifies for Shingles Vaccine? Yes   Zostavax completed No   Shingrix Completed?: No.    Education has been provided regarding the importance of this vaccine. Patient  has been advised to call insurance company to determine out of pocket expense if they have not yet received this vaccine. Advised may also receive vaccine at local pharmacy or Health Dept. Verbalized acceptance and understanding.  Screening Tests Health Maintenance  Topic Date Due   TETANUS/TDAP  07/13/2026 (Originally 09/21/1969)   COVID-19 Vaccine (4 - Booster for [place] series) 12/24/2020   MAMMOGRAM  07/04/2022   COLONOSCOPY (Pts 45-55yr Insurance coverage will need to be confirmed)  03/05/2025   INFLUENZA VACCINE  Completed   DEXA SCAN  Completed   Hepatitis C Screening  Completed   PNA vac Low Risk Adult  Completed   HPV VACCINES  Aged Out    Health Maintenance  There are no preventive care reminders to display for this patient.  Colorectal cancer screening: Type of screening: Colonoscopy. Completed 03/05/20. Repeat every 5 years  Mammogram status: Completed 07/04/20. Repeat every year  Bone Density status: Completed 02/03/18. Results reflect: Bone density results: OSTEOPENIA. Repeat every 5 years.  Lung Cancer Screening: (Low Dose CT Chest recommended if Age 70-80years, 30 pack-year currently smoking OR have quit w/in 15years.) does qualify however had this completed 09/05/20. Repeat scheduled for 02/24/21.  Additional Screening:  Hepatitis C Screening: Up to date  Vision Screening: Recommended annual ophthalmology exams for early detection of glaucoma and other disorders of the eye. Is the patient up to date with their annual eye exam?  Yes  Who is the provider or what is the name of the office in which the patient attends annual eye exams? Dr BGloriann Loan If pt is not established with a provider, would they like to be referred to a provider to establish care? No .   Dental Screening: Recommended annual dental exams for proper oral hygiene  Community Resource Referral / Chronic Care Management: CRR required this visit?  No   CCM required this visit?  No      Plan:     I  have personally reviewed and noted the following in the patient's chart:   Medical and social history Use of alcohol, tobacco or illicit drugs  Current medications and supplements Functional ability and status Nutritional status Physical activity Advanced directives List of other physicians Hospitalizations, surgeries, and ER visits in previous 12 months Vitals Screenings to include cognitive, depression, and falls Referrals and appointments  In addition, I have reviewed and discussed with patient certain preventive protocols, quality metrics, and best practice recommendations. A written personalized care plan for preventive services as well as general preventive health recommendations were provided to patient.     Rebbie Lauricella [place]  [id number]  Nurse Notes: None.  Reviewed note of Nurse Health Advisor's screening. Agree with documentation and recommendations.

## 2020-10-03 ENCOUNTER — Ambulatory Visit (INDEPENDENT_AMBULATORY_CARE_PROVIDER_SITE_OTHER): Payer: Medicare HMO

## 2020-10-03 DIAGNOSIS — M5416 Radiculopathy, lumbar region: Secondary | ICD-10-CM | POA: Diagnosis not present

## 2020-10-03 DIAGNOSIS — Z0000 Encounter for general adult medical examination without abnormal findings: Secondary | ICD-10-CM | POA: Diagnosis not present

## 2020-10-03 DIAGNOSIS — G8929 Other chronic pain: Secondary | ICD-10-CM | POA: Diagnosis not present

## 2020-10-03 DIAGNOSIS — M47816 Spondylosis without myelopathy or radiculopathy, lumbar region: Secondary | ICD-10-CM | POA: Diagnosis not present

## 2020-10-03 DIAGNOSIS — M545 Low back pain, unspecified: Secondary | ICD-10-CM | POA: Diagnosis not present

## 2020-10-03 NOTE — Patient Instructions (Signed)
Ms. Carrie Alvarez , Thank you for taking time to come for your Medicare Wellness Visit. I appreciate your ongoing commitment to your health goals. Please review the following plan we discussed and let me know if I can assist you in the future.   Screening recommendations/referrals: Colonoscopy: Up to date, due 02/2025 Mammogram: Up to date, due 06/2021 Bone Density: Previous DEXA scan was normal. No repeat needed unless advised by a physician. Recommended yearly ophthalmology/optometry visit for glaucoma screening and checkup Recommended yearly dental visit for hygiene and checkup  Vaccinations: Influenza vaccine: Done 06/25/20 Pneumococcal vaccine: Completed series Tdap vaccine: Currently due, declined receiving. Shingles vaccine: Shingrix discussed. Please contact your pharmacy for coverage information.     Advanced directives: Advance directive discussed with you today. Even though you declined this today please call our office should you change your mind and we can give you the proper paperwork for you to fill out.  Conditions/risks identified: Recommend to decrease portion sizes by eating 3 small healthy meals and at least 2 healthy snacks per day.  Next appointment: 11/21/20 @ 9:00 AM with [name] 65 Years and Older, Female Preventive care refers to lifestyle choices and visits with your health care provider that can promote health and wellness. What does preventive care include?  A yearly physical exam. This is also called an annual well check.  Dental exams once or twice a year.  Routine eye exams. Ask your health care provider how often you should have your eyes checked.  Personal lifestyle choices, including:  Daily care of your teeth and gums.  Regular physical activity.  Eating a healthy diet.  Avoiding tobacco and drug use.  Limiting alcohol use.  Practicing safe sex.  Taking low-dose aspirin every day.  Taking vitamin and mineral  supplements as recommended by your health care provider. What happens during an annual well check? The services and screenings done by your health care provider during your annual well check will depend on your age, overall health, lifestyle risk factors, and family history of disease. Counseling  Your health care provider may ask you questions about your:  Alcohol use.  Tobacco use.  Drug use.  Emotional well-being.  Home and relationship well-being.  Sexual activity.  Eating habits.  History of falls.  Memory and ability to understand (cognition).  Work and work Statistician.  Reproductive health. Screening  You may have the following tests or measurements:  Height, weight, and BMI.  Blood pressure.  Lipid and cholesterol levels. These may be checked every 5 years, or more frequently if you are over 70 years old.  Skin check.  Lung cancer screening. You may have this screening every year starting at age 70 if you have a 30-pack-year history of smoking and currently smoke or have quit within the past 15 years.  Fecal occult blood test (FOBT) of the stool. You may have this test every year starting at age 70.  Flexible sigmoidoscopy or colonoscopy. You may have a sigmoidoscopy every 5 years or a colonoscopy every 10 years starting at age 70.  Hepatitis C blood test.  Hepatitis B blood test.  Sexually transmitted disease (STD) testing.  Diabetes screening. This is done by checking your blood sugar (glucose) after you have not eaten for a while (fasting). You may have this done every 1-3 years.  Bone density scan. This is done to screen for osteoporosis. You may have this done starting at age 70.  Mammogram. This may be done  every 1-2 years. Talk to your health care provider about how often you should have regular mammograms. Talk with your health care provider about your test results, treatment options, and if necessary, the need for more tests. Vaccines  Your  health care provider may recommend certain vaccines, such as:  Influenza vaccine. This is recommended every year.  Tetanus, diphtheria, and acellular pertussis (Tdap, Td) vaccine. You may need a Td booster every 10 years.  Zoster vaccine. You may need this after age 70.  Pneumococcal 13-valent conjugate (PCV13) vaccine. One dose is recommended after age 70.  Pneumococcal polysaccharide (PPSV23) vaccine. One dose is recommended after age 70. Talk to your health care provider about which screenings and vaccines you need and how often you need them. This information is not intended to replace advice given to you by your health care provider. Make sure you discuss any questions you have with your health care provider. Document Released: 07/26/2015 Document Revised: 03/18/2016 Document Reviewed: 04/30/2015 Elsevier Interactive Patient Education  2017 [place] Prevention in the Home Falls can cause injuries. They can happen to people of all ages. There are many things you can do to make your home safe and to help prevent falls. What can I do on the outside of my home?  Regularly fix the edges of walkways and driveways and fix any cracks.  Remove anything that might make you trip as you walk through a door, such as a raised step or threshold.  Trim any bushes or trees on the path to your home.  Use bright outdoor lighting.  Clear any walking paths of anything that might make someone trip, such as rocks or tools.  Regularly check to see if handrails are loose or broken. Make sure that both sides of any steps have handrails.  Any raised decks and porches should have guardrails on the edges.  Have any leaves, snow, or ice cleared regularly.  Use sand or salt on walking paths during winter.  Clean up any spills in your garage right away. This includes oil or grease spills. What can I do in the bathroom?  Use night lights.  Install grab bars by the toilet and in the tub and  shower. Do not use towel bars as grab bars.  Use non-skid mats or decals in the tub or shower.  If you need to sit down in the shower, use a plastic, non-slip stool.  Keep the floor dry. Clean up any water that spills on the floor as soon as it happens.  Remove soap buildup in the tub or shower regularly.  Attach bath mats securely with double-sided non-slip rug tape.  Do not have throw rugs and other things on the floor that can make you trip. What can I do in the bedroom?  Use night lights.  Make sure that you have a light by your bed that is easy to reach.  Do not use any sheets or blankets that are too big for your bed. They should not hang down onto the floor.  Have a firm chair that has side arms. You can use this for support while you get dressed.  Do not have throw rugs and other things on the floor that can make you trip. What can I do in the kitchen?  Clean up any spills right away.  Avoid walking on wet floors.  Keep items that you use a lot in easy-to-reach places.  If you need to reach something above you, use a  strong step stool that has a grab bar.  Keep electrical cords out of the way.  Do not use floor polish or wax that makes floors slippery. If you must use wax, use non-skid floor wax.  Do not have throw rugs and other things on the floor that can make you trip. What can I do with my stairs?  Do not leave any items on the stairs.  Make sure that there are handrails on both sides of the stairs and use them. Fix handrails that are broken or loose. Make sure that handrails are as long as the stairways.  Check any carpeting to make sure that it is firmly attached to the stairs. Fix any carpet that is loose or worn.  Avoid having throw rugs at the top or bottom of the stairs. If you do have throw rugs, attach them to the floor with carpet tape.  Make sure that you have a light switch at the top of the stairs and the bottom of the stairs. If you do not  have them, ask someone to add them for you. What else can I do to help prevent falls?  Wear shoes that:  Do not have high heels.  Have rubber bottoms.  Are comfortable and fit you well.  Are closed at the toe. Do not wear sandals.  If you use a stepladder:  Make sure that it is fully opened. Do not climb a closed stepladder.  Make sure that both sides of the stepladder are locked into place.  Ask someone to hold it for you, if possible.  Clearly mark and make sure that you can see:  Any grab bars or handrails.  First and last steps.  Where the edge of each step is.  Use tools that help you move around (mobility aids) if they are needed. These include:  Canes.  Walkers.  Scooters.  Crutches.  Turn on the lights when you go into a dark area. Replace any light bulbs as soon as they burn out.  Set up your furniture so you have a clear path. Avoid moving your furniture around.  If any of your floors are uneven, fix them.  If there are any pets around you, be aware of where they are.  Review your medicines with your doctor. Some medicines can make you feel dizzy. This can increase your chance of falling. Ask your doctor what other things that you can do to help prevent falls. This information is not intended to replace advice given to you by your health care provider. Make sure you discuss any questions you have with your health care provider. Document Released: 04/25/2009 Document Revised: 12/05/2015 Document Reviewed: 08/03/2014 Elsevier Interactive Patient Education  2017 [employer].

## 2020-10-12 DIAGNOSIS — G894 Chronic pain syndrome: Secondary | ICD-10-CM | POA: Diagnosis not present

## 2020-10-12 DIAGNOSIS — M545 Low back pain, unspecified: Secondary | ICD-10-CM | POA: Diagnosis not present

## 2020-10-12 DIAGNOSIS — M542 Cervicalgia: Secondary | ICD-10-CM | POA: Diagnosis not present

## 2020-10-16 DIAGNOSIS — H40003 Preglaucoma, unspecified, bilateral: Secondary | ICD-10-CM | POA: Diagnosis not present

## 2020-10-20 ENCOUNTER — Emergency Department: Payer: Medicare HMO

## 2020-10-20 ENCOUNTER — Encounter: Payer: Self-pay | Admitting: Emergency Medicine

## 2020-10-20 ENCOUNTER — Emergency Department
Admission: EM | Admit: 2020-10-20 | Discharge: 2020-10-20 | Disposition: A | Discharge status: Home / Self Care | Payer: Medicare HMO | Attending: Emergency Medicine | Admitting: Emergency Medicine

## 2020-10-20 ENCOUNTER — Other Ambulatory Visit: Payer: Self-pay

## 2020-10-20 DIAGNOSIS — Z20822 Contact with and (suspected) exposure to covid-19: Secondary | ICD-10-CM | POA: Diagnosis not present

## 2020-10-20 DIAGNOSIS — R059 Cough, unspecified: Secondary | ICD-10-CM | POA: Diagnosis not present

## 2020-10-20 DIAGNOSIS — I10 Essential (primary) hypertension: Secondary | ICD-10-CM | POA: Insufficient documentation

## 2020-10-20 DIAGNOSIS — Z87891 Personal history of nicotine dependence: Secondary | ICD-10-CM | POA: Insufficient documentation

## 2020-10-20 DIAGNOSIS — Z79899 Other long term (current) drug therapy: Secondary | ICD-10-CM | POA: Diagnosis not present

## 2020-10-20 DIAGNOSIS — Z853 Personal history of malignant neoplasm of breast: Secondary | ICD-10-CM | POA: Insufficient documentation

## 2020-10-20 DIAGNOSIS — K529 Noninfective gastroenteritis and colitis, unspecified: Secondary | ICD-10-CM | POA: Insufficient documentation

## 2020-10-20 DIAGNOSIS — Z8542 Personal history of malignant neoplasm of other parts of uterus: Secondary | ICD-10-CM | POA: Diagnosis not present

## 2020-10-20 DIAGNOSIS — R079 Chest pain, unspecified: Secondary | ICD-10-CM | POA: Diagnosis not present

## 2020-10-20 DIAGNOSIS — Z85038 Personal history of other malignant neoplasm of large intestine: Secondary | ICD-10-CM | POA: Insufficient documentation

## 2020-10-20 DIAGNOSIS — R109 Unspecified abdominal pain: Secondary | ICD-10-CM | POA: Diagnosis not present

## 2020-10-20 LAB — COMPREHENSIVE METABOLIC PANEL
ALT: 17 U/L (ref 0–44)
AST: 21 U/L (ref 15–41)
Albumin: 4.4 g/dL (ref 3.5–5.0)
Alkaline Phosphatase: 78 U/L (ref 38–126)
Anion gap: 10 (ref 5–15)
BUN: 13 mg/dL (ref 8–23)
CO2: 24 mmol/L (ref 22–32)
Calcium: 9.9 mg/dL (ref 8.9–10.3)
Chloride: 108 mmol/L (ref 98–111)
Creatinine, Ser: 0.91 mg/dL (ref 0.44–1.00)
GFR, Estimated: >60 mL/min (ref >60)
Glucose, Bld: 95 mg/dL (ref 70–99)
Potassium: 3.7 mmol/L (ref 3.5–5.1)
Sodium: 142 mmol/L (ref 135–145)
Total Bilirubin: 1.1 mg/dL (ref 0.3–1.2)
Total Protein: 8 g/dL (ref 6.5–8.1)

## 2020-10-20 LAB — URINALYSIS, COMPLETE (UACMP) WITH MICROSCOPIC
Bilirubin Urine: NEGATIVE
Glucose, UA: NEGATIVE mg/dL
Hgb urine dipstick: NEGATIVE
Ketones, ur: 5 mg/dL — AB
Leukocytes,Ua: NEGATIVE
Nitrite: NEGATIVE
Protein, ur: NEGATIVE mg/dL
Specific Gravity, Urine: 1.041 — ABNORMAL HIGH (ref 1.005–1.030)
pH: 5 (ref 5.0–8.0)

## 2020-10-20 LAB — TROPONIN I (HIGH SENSITIVITY): Troponin I (High Sensitivity): 5 ng/L (ref <18)

## 2020-10-20 LAB — CBC
HCT: 39.1 % (ref 36.0–46.0)
Hemoglobin: 13.4 g/dL (ref 12.0–15.0)
MCH: 28.5 pg (ref 26.0–34.0)
MCHC: 34.3 g/dL (ref 30.0–36.0)
MCV: 83.2 fL (ref 80.0–100.0)
Platelets: 244 10*3/uL (ref 150–400)
RBC: 4.7 MIL/uL (ref 3.87–5.11)
RDW: 14.3 % (ref 11.5–15.5)
WBC: 8.6 10*3/uL (ref 4.0–10.5)
nRBC: 0 % (ref 0.0–0.2)

## 2020-10-20 LAB — RESP PANEL BY RT-PCR (FLU A&B, COVID) ARPGX2
Influenza A by PCR: NEGATIVE
Influenza B by PCR: NEGATIVE
SARS Coronavirus 2 by RT PCR: NEGATIVE

## 2020-10-20 LAB — LACTIC ACID, PLASMA: Lactic Acid, Venous: 1.4 mmol/L (ref 0.5–1.9)

## 2020-10-20 LAB — LIPASE, BLOOD: Lipase: 27 U/L (ref 11–51)

## 2020-10-20 MED ORDER — IOHEXOL 300 MG/ML  SOLN
100.0000 mL | Freq: Once | INTRAMUSCULAR | Status: AC | PRN
  Administered 2020-10-20: 100 mL via INTRAVENOUS

## 2020-10-20 MED ORDER — PIPERACILLIN-TAZOBACTAM 3.375 G IVPB 30 MIN
3.3750 g | Freq: Once | INTRAVENOUS | Status: AC
  Administered 2020-10-20: 3.375 g via INTRAVENOUS
  Filled 2020-10-20: qty 50

## 2020-10-20 MED ORDER — ONDANSETRON 8 MG PO TBDP: 8.0000 mg | ORAL_TABLET | Freq: Three times a day (TID) | ORAL | 0 refills | Status: DC | PRN

## 2020-10-20 MED ORDER — MORPHINE SULFATE (PF) 4 MG/ML IV SOLN
4.0000 mg | Freq: Once | INTRAVENOUS | Status: AC
  Administered 2020-10-20: 4 mg via INTRAVENOUS
  Filled 2020-10-20: qty 1

## 2020-10-20 MED ORDER — AMOXICILLIN-POT CLAVULANATE 875-125 MG PO TABS: 1.0000 | ORAL_TABLET | Freq: Two times a day (BID) | ORAL | 0 refills | Status: AC

## 2020-10-20 MED ORDER — ONDANSETRON HCL 4 MG/2ML IJ SOLN
4.0000 mg | Freq: Once | INTRAMUSCULAR | Status: AC
  Administered 2020-10-20: 4 mg via INTRAVENOUS
  Filled 2020-10-20: qty 2

## 2020-10-20 MED ORDER — SODIUM CHLORIDE 0.9 % IV BOLUS
500.0000 mL | Freq: Once | INTRAVENOUS | Status: AC
  Administered 2020-10-20: 500 mL via INTRAVENOUS

## 2020-10-20 MED ORDER — TRAMADOL HCL 50 MG PO TABS: 50.0000 mg | ORAL_TABLET | Freq: Four times a day (QID) | ORAL | 0 refills | Status: AC | PRN

## 2020-10-20 NOTE — ED Provider Notes (Signed)
[facility] Emergency Department Provider Note ____________________________________________   Event Date/Time   First MD Initiated Contact with Patient 10/20/20 1213     (approximate)  I have reviewed the triage vital signs and the nursing notes.   HISTORY  Chief Complaint Chills, Cough, chest congestion, and Diarrhea    HPI Carrie Alvarez is a 70 y.o. female with PMH as noted below who presents with abdominal pain since yesterday, diffuse location, nonradiating, and associated with nausea, vomiting, and nonbloody diarrhea.  The pain occurs in waves.  The patient also reports nonproductive cough some chest discomfort but no shortness of breath.  She has had decreased appetite and some nausea for several days but the pain did not start until yesterday.  She denies any urinary symptoms.   Past Medical History:  Diagnosis Date  . Allergy   . Anemia   . Back pain   . Breast cancer ([place]) 2015   LT LUMPECTOMY 12-2014 FOLLOWING CHEMO  . Carpal tunnel syndrome    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  . Cataract   . Chronic pain   . Colon cancer ([place]) 01/2019  . Depression   . Dyspnea   . Family history of breast cancer   . Family history of colon cancer   . GERD (gastroesophageal reflux disease)    problem with reflux during chemo  . History of methicillin resistant staphylococcus aureus (MRSA)   . Hypercholesteremia   . Hypertension   . Lumbosacral pain    sees pain management in [place]  . Obesity   . Personal history of chemotherapy 2016   left breast ca  . Personal history of chemotherapy 2020   colon ca  . Pinched nerve    left side, lumbar area  . Uterine cancer [facility]) 1994    Patient Active Problem List   Diagnosis Date Noted  . Polyp of descending colon   . MLH1-related Lynch syndrome (HNPCC2) 06/01/2019  . Genetic testing 06/01/2019  . Family history of breast cancer   . Family history of colon cancer   . Goals of care,  counseling/discussion 01/22/2019  . Colon cancer ([place]) 01/10/2019  . Personal history of colonic polyps   . Polyp of transverse colon   . Neoplasm of digestive system   . Morbid obesity ([place]) 06/07/2018  . Acute cholecystitis   . Transaminitis   . Cholangitis 04/18/2016  . Allergic rhinitis 09/04/2015  . AA (alopecia areata) 09/04/2015  . Arthritis, degenerative 09/04/2015  . Cancer of uterus ([place]) 09/04/2015  . Avitaminosis D 09/04/2015  . Benign positional vertigo 09/04/2015  . Hypertension 03/15/2015  . DDD (degenerative disc disease), lumbar 03/07/2015  . Dizziness 12/25/2014  . Lumbosacral facet joint syndrome 12/10/2014  . Sacroiliac joint dysfunction 12/10/2014  . DDD (degenerative disc disease), lumbosacral 11/13/2014  . Depression 10/13/2014  . Breast cancer metastasized to axillary lymph node ([place]) 06/21/2014  . Hypercholesteremia 08/30/2009  . Compulsive tobacco user syndrome 04/21/2008  . Adaptation reaction 07/18/2007    Past Surgical History:  Procedure Laterality Date  . ABDOMINAL HYSTERECTOMY  1994   UTERINE CA  . AXILLARY LYMPH NODE BIOPSY Left 12/18/2014   Procedure: AXILLARY LYMPH NODE BIOPSY/;  Surgeon: Robert Bellow, MD;  Location: [facility] ORS;  Service: General;  Laterality: Left;  . AXILLARY LYMPH NODE DISSECTION Left 12/18/2014   Procedure: AXILLARY LYMPH NODE DISSECTION;  Surgeon: Robert Bellow, MD;  Location: [facility] ORS;  Service: General;  Laterality: Left;  . BREAST  BIOPSY Left 05/2014   CORE BX OF LN, METASTATIC ADENOCARCINOMA  . BREAST LUMPECTOMY Left 12/2014   CHEMO FIRST THEN SURGERY OF LN  . BREAST SURGERY     lymph node removal  . CHOLECYSTECTOMY N/A 04/19/2016   Procedure: LAPAROSCOPIC CHOLECYSTECTOMY WITH INTRAOPERATIVE CHOLANGIOGRAM;  Surgeon: Hubbard Robinson, MD;  Location: [facility] ORS;  Service: General;  Laterality: N/A;  . COLON RESECTION Right 01/10/2019   Procedure: HAND ASSISTED LAPAROSCOPIC RIGHT COLON RESECTION;  Surgeon: Jules Husbands, MD;  Location: [facility] ORS;  Service: General;  Laterality: Right;  . COLONOSCOPY WITH PROPOFOL N/A 12/30/2018   Procedure: COLONOSCOPY WITH PROPOFOL;  Surgeon: Lucilla Lame, MD;  Location: [place];  Service: Endoscopy;  Laterality: N/A;  . COLONOSCOPY WITH PROPOFOL N/A 03/05/2020   Procedure: COLONOSCOPY WITH PROPOFOL;  Surgeon: Lucilla Lame, MD;  Location: [facility] ENDOSCOPY;  Service: Endoscopy;  Laterality: N/A;  . medial branch block  11/06/2015   lumbar facet Dr. Primus Bravo  . POLYPECTOMY N/A 12/30/2018   Procedure: POLYPECTOMY;  Surgeon: Lucilla Lame, MD;  Location: [place];  Service: Endoscopy;  Laterality: N/A;  Clips placed at Hepatic Flexure Polyp (2) and Transverse Colon Polyp (4) removal sites  . PORTACATH PLACEMENT Right 02/02/2019   Procedure: INSERTION PORT-A-CATH;  Surgeon: Jules Husbands, MD;  Location: [facility] ORS;  Service: General;  Laterality: Right;  . RADIOFREQUENCY ABLATION     lumbar  . SENTINEL NODE BIOPSY Left 12/18/2014   Procedure: SENTINEL NODE BIOPSY;  Surgeon: Robert Bellow, MD;  Location: [facility] ORS;  Service: General;  Laterality: Left;    Prior to Admission medications   Medication Sig Start Date End Date Taking? Authorizing Provider  amoxicillin-clavulanate (AUGMENTIN) 875-125 MG tablet Take 1 tablet by mouth 2 (two) times daily for 10 days. 10/20/20 10/30/20 Yes Arta Silence, MD  ondansetron (ZOFRAN ODT) 8 MG disintegrating tablet Take 1 tablet (8 mg total) by mouth every 8 (eight) hours as needed for nausea or vomiting. 10/20/20  Yes Arta Silence, MD  traMADol (ULTRAM) 50 MG tablet Take 1 tablet (50 mg total) by mouth every 6 (six) hours as needed for up to 5 days. 10/20/20 10/25/20 Yes Arta Silence, MD  acetaminophen (TYLENOL) 500 MG tablet Take 500 mg by mouth every 6 (six) hours as needed for mild pain or moderate pain.     [provider]  albuterol (VENTOLIN HFA) 108 (90 Base) MCG/ACT inhaler Inhale 2 puffs into  the lungs every 6 (six) hours as needed for wheezing or shortness of breath. 08/06/20   Trinna Post, PA-C  amLODipine (NORVASC) 5 MG tablet Take 1 tablet (5 mg total) by mouth daily. MD re-ordered at decreased dose 04/19/20   Trinna Post, PA-C  atorvastatin (LIPITOR) 10 MG tablet TAKE 1 TABLET BY MOUTH IN THE MORNING 04/09/20   Trinna Post, PA-C  baclofen (LIORESAL) 10 MG tablet Take 1 tablet (10 mg total) by mouth 2 (two) times daily. 10/28/17   Trinna Post, PA-C  cetirizine (ZYRTEC) 10 MG tablet Take 10 mg by mouth daily at 6 (six) AM.    [provider]  cholecalciferol (VITAMIN D) 1000 units tablet Take 1,000 Units by mouth daily.    [provider]  diclofenac Sodium (VOLTAREN) 1 % GEL Apply 4 g topically 4 (four) times daily. 06/05/20   [provider]  fluticasone (FLONASE) 50 MCG/ACT nasal spray Place 2 sprays into both nostrils daily. 01/17/20   Trinna Post, PA-C  gabapentin (  NEURONTIN) 300 MG capsule Take 300-600 mg by mouth 4 (four) times daily as needed (neuropathic pain).     [provider]  letrozole (FEMARA) 2.5 MG tablet Take 2.5 mg by mouth daily.  04/07/19   [provider]  NARCAN 4 MG/0.1ML LIQD nasal spray kit Place 1 spray into the nose.  06/06/19   [provider]  Oxycodone HCl 10 MG TABS LIMIT ONE HALF TO ONE TABLET BY MOUTH 3 TO 5 TIMES DAILY IF TOLERATED 02/21/19   [provider]  predniSONE (DELTASONE) 20 MG tablet Take 1 tablet (20 mg total) by mouth daily with breakfast. 08/06/20   Carles Collet M, PA-C  PREVIDENT 5000 DRY MOUTH 1.1 % GEL dental gel Place onto teeth at bedtime. 06/28/20   [provider]  pyridoxine (B-6) 100 MG tablet Take 100 mg by mouth daily.    [provider]  sertraline (ZOLOFT) 100 MG tablet Take 2 tablets by mouth once daily 01/10/20   Trinna Post, PA-C  vitamin B-12 (CYANOCOBALAMIN) 500 MCG tablet Take 500 mcg by mouth daily.     [provider]    Allergies Patient has no known allergies.  Family History  Problem Relation Age of Onset  . Breast cancer Sister 55  . Colon cancer Sister   . Colon cancer Mother        dx 76s  . Hypertension Mother   . Asthma Mother   . Cancer Father        voice box removed  . Cancer Maternal Grandmother        unsure type  . Colon cancer Cousin   . Cancer Cousin        unsure type    Social History Social History   Tobacco Use  . Smoking status: Former Smoker    Packs/day: 0.50    Types: Cigarettes    Quit date: 07/18/2014    Years since quitting: 6.2  . Smokeless tobacco: Never Used  Vaping Use  . Vaping Use: Never used  Substance Use Topics  . Alcohol use: No    Alcohol/week: 0.0 standard drinks  . Drug use: No    Review of Systems  Constitutional: No fever. Eyes: No redness. ENT: No sore throat. Cardiovascular: Positive for chest pain. Respiratory: Denies shortness of breath. Gastrointestinal: Positive for vomiting and diarrhea.  Genitourinary: Negative for dysuria.  Musculoskeletal: Negative for back pain. Skin: Negative for rash. Neurological: Negative for headache.   ____________________________________________   PHYSICAL EXAM:  VITAL SIGNS: ED Triage Vitals  Enc Vitals Group     BP 10/20/20 1207 (!) 121/103     Pulse Rate 10/20/20 1207 81     Resp 10/20/20 1207 16     Temp 10/20/20 1207 98.6 F (37 C)     Temp Source 10/20/20 1207 Oral     SpO2 10/20/20 1207 100 %     Weight 10/20/20 1208 208 lb (94.3 kg)     Height 10/20/20 1208 5' 3" (1.6 m)     Head Circumference --      Peak Flow --      Pain Score 10/20/20 1208 7     Pain Loc --      Pain Edu? --      Excl. in [place]? --     Constitutional: Alert and oriented.  Uncomfortable appearing but in no acute distress. Eyes: Conjunctivae are normal.  No scleral icterus. Head: Atraumatic. Nose: No congestion/rhinnorhea. Mouth/Throat: Mucous membranes are moist.  Neck:  Normal range of motion.  Cardiovascular: Normal rate, regular rhythm. Grossly normal heart sounds.  Good peripheral circulation. Respiratory: Normal respiratory effort.  No retractions. Lungs CTAB. Gastrointestinal: Soft with mild diffuse discomfort but no focal tenderness or peritoneal signs.  No distention.  Genitourinary: No flank tenderness. Musculoskeletal: No lower extremity edema.  Extremities warm and well perfused.  Neurologic:  Normal speech and language. No gross focal neurologic deficits are appreciated.  Skin:  Skin is warm and dry. No rash noted. Psychiatric: Mood and affect are normal. Speech and behavior are normal.  ____________________________________________   LABS (all labs ordered are listed, but only abnormal results are displayed)  Labs Reviewed  URINALYSIS, COMPLETE (UACMP) WITH MICROSCOPIC - Abnormal; Notable for the following components:      Result Value   Color, Urine YELLOW (*)    APPearance HAZY (*)    Specific Gravity, Urine 1.041 (*)    Ketones, ur 5 (*)    Bacteria, UA RARE (*)    All other components within normal limits  RESP PANEL BY RT-PCR (FLU A&B, COVID) ARPGX2  CBC  LACTIC ACID, PLASMA  COMPREHENSIVE METABOLIC PANEL  LIPASE, BLOOD  TROPONIN I (HIGH SENSITIVITY)   ____________________________________________  EKG  ED ECG REPORT I, Arta Silence, the attending physician, personally viewed and interpreted this ECG.  Date: 10/20/2020 EKG Time: 1251 Rate: 70 Rhythm: normal sinus rhythm QRS Axis: Borderline left axis Intervals: normal ST/T Wave abnormalities: normal Narrative Interpretation: no evidence of acute ischemia  ____________________________________________  RADIOLOGY  Chest x-ray interpreted by me shows no focal infiltrate or edema CT abdomen/pelvis: Possible mild colitis  ____________________________________________   PROCEDURES  Procedure(s) performed: No  Procedures  Critical Care performed:  No ____________________________________________   INITIAL IMPRESSION / ASSESSMENT AND PLAN / ED COURSE  Pertinent labs & imaging results that were available during my care of the patient were reviewed by me and considered in my medical decision making (see chart for details).  70 year old female with PMH as noted above including stage IIIa colon cancer presents with abdominal pain along with nausea, vomiting, and diarrhea as well as some cough for the last day.  I reviewed the past medical records in [place].  The patient was most recently seen by oncology on 2/24 and at that time had no recurrent or metastatic colon disease.  On exam today the patient is uncomfortable appearing but in no acute distress.  Her vital signs are normal.  The abdomen is soft with mild diffuse discomfort but no focal tenderness.  Physical exam is otherwise unremarkable.  Differential includes gastroenteritis, gastritis, diverticulitis, colitis, or less likely acute appendicitis, small bowel obstruction, or hepatobiliary cause.  We will obtain lab work-up, CT, chest x-ray, give symptomatic treatment and reassess.  ----------------------------------------- 3:39 PM on 10/20/2020 -----------------------------------------  CT shows colitis.  On reassessment the patient's pain has improved although she still appears somewhat uncomfortable.  I will order additional pain medication and a dose of Zosyn.  Plan will be to reassess after this and if the patient's pain is well controlled and she is tolerating p.o. she may be able to go home with oral antibiotics.  I signed her out to the oncoming ED physician Dr. Corky Downs.  ____________________________________________   FINAL CLINICAL IMPRESSION(S) / ED DIAGNOSES  Final diagnoses:  Colitis      NEW MEDICATIONS STARTED DURING THIS VISIT:  New Prescriptions   AMOXICILLIN-CLAVULANATE (AUGMENTIN) 875-125 MG TABLET    Take 1 tablet by mouth 2 (two) times daily for  10 days.    ONDANSETRON (ZOFRAN ODT) 8 MG DISINTEGRATING TABLET    Take 1 tablet (8 mg total) by mouth every 8 (eight) hours as needed for nausea or vomiting.   TRAMADOL (ULTRAM) 50 MG TABLET    Take 1 tablet (50 mg total) by mouth every 6 (six) hours as needed for up to 5 days.     Note:  This document was prepared using Dragon voice recognition software and may include unintentional dictation errors.   Arta Silence, MD 10/20/20 1539

## 2020-10-20 NOTE — ED Notes (Signed)
Dr. Cherylann Banas, MD at bedside.

## 2020-10-20 NOTE — ED Notes (Signed)
Pt transported to CT scan and X-Ray.

## 2020-10-20 NOTE — Discharge Instructions (Addendum)
Return to the ER for new, worsening, or persistent severe abdominal pain, vomiting, worsening diarrhea especially any blood in the stool, fever, weakness, or any other new or worsening symptom that concerns you.  Take antibiotic as prescribed and finish the full course.  Follow-up with your primary doctor in 1 week.

## 2020-10-20 NOTE — ED Triage Notes (Signed)
Pt to ED via POV c/o N/V/D, chest congestion, chills, and cough x 3 days. Pt states that the last time she vomited was this morning. Pt is in NAD.

## 2020-10-20 NOTE — ED Notes (Signed)
Pt presented to ED with chills, cough, chest congestion, NVD. Pt states that the cough, chills, and the chest congestion started 3 days ago. Pt states yellow sputum associated with productive cough, sputum is clear today. Pt is updated on covid vaccinations but has not been covid tested recently. Pt denies fever and chest pain associated with cough and chest congestion. Pt complains of abdominal pain in the epigastric area that started yesterday. Pt had episodes of vomiting and diarrhea x3 today. Pt denies history of abdominal surgery. Pt is A&Ox4 and NAD.

## 2020-10-24 ENCOUNTER — Other Ambulatory Visit: Payer: Self-pay | Admitting: Physician Assistant

## 2020-10-24 DIAGNOSIS — F32A Depression, unspecified: Secondary | ICD-10-CM

## 2020-10-24 DIAGNOSIS — J309 Allergic rhinitis, unspecified: Secondary | ICD-10-CM

## 2020-10-28 ENCOUNTER — Other Ambulatory Visit: Payer: Self-pay | Admitting: Physician Assistant

## 2020-10-28 DIAGNOSIS — M25519 Pain in unspecified shoulder: Secondary | ICD-10-CM | POA: Diagnosis not present

## 2020-10-28 DIAGNOSIS — M545 Low back pain, unspecified: Secondary | ICD-10-CM | POA: Diagnosis not present

## 2020-10-28 DIAGNOSIS — M48062 Spinal stenosis, lumbar region with neurogenic claudication: Secondary | ICD-10-CM | POA: Diagnosis not present

## 2020-10-28 DIAGNOSIS — G894 Chronic pain syndrome: Secondary | ICD-10-CM | POA: Diagnosis not present

## 2020-10-28 DIAGNOSIS — M25559 Pain in unspecified hip: Secondary | ICD-10-CM | POA: Diagnosis not present

## 2020-10-28 DIAGNOSIS — R519 Headache, unspecified: Secondary | ICD-10-CM | POA: Diagnosis not present

## 2020-10-28 DIAGNOSIS — G8929 Other chronic pain: Secondary | ICD-10-CM | POA: Diagnosis not present

## 2020-10-28 DIAGNOSIS — M792 Neuralgia and neuritis, unspecified: Secondary | ICD-10-CM | POA: Diagnosis not present

## 2020-10-28 DIAGNOSIS — M542 Cervicalgia: Secondary | ICD-10-CM | POA: Diagnosis not present

## 2020-10-28 DIAGNOSIS — M47897 Other spondylosis, lumbosacral region: Secondary | ICD-10-CM | POA: Diagnosis not present

## 2020-10-28 DIAGNOSIS — M4807 Spinal stenosis, lumbosacral region: Secondary | ICD-10-CM | POA: Diagnosis not present

## 2020-10-28 DIAGNOSIS — M9973 Connective tissue and disc stenosis of intervertebral foramina of lumbar region: Secondary | ICD-10-CM | POA: Diagnosis not present

## 2020-10-28 DIAGNOSIS — I10 Essential (primary) hypertension: Secondary | ICD-10-CM

## 2020-10-28 DIAGNOSIS — M9943 Connective tissue stenosis of neural canal of lumbar region: Secondary | ICD-10-CM | POA: Diagnosis not present

## 2020-10-30 ENCOUNTER — Ambulatory Visit (INDEPENDENT_AMBULATORY_CARE_PROVIDER_SITE_OTHER): Payer: Medicare HMO | Admitting: Family Medicine

## 2020-10-30 ENCOUNTER — Encounter: Payer: Self-pay | Admitting: Family Medicine

## 2020-10-30 ENCOUNTER — Other Ambulatory Visit: Payer: Self-pay

## 2020-10-30 VITALS — BP 128/89 | HR 96 | Temp 98.2°F | Wt 199.0 lb

## 2020-10-30 DIAGNOSIS — R110 Nausea: Secondary | ICD-10-CM

## 2020-10-30 DIAGNOSIS — K529 Noninfective gastroenteritis and colitis, unspecified: Secondary | ICD-10-CM | POA: Diagnosis not present

## 2020-10-30 DIAGNOSIS — M47816 Spondylosis without myelopathy or radiculopathy, lumbar region: Secondary | ICD-10-CM | POA: Diagnosis not present

## 2020-10-30 DIAGNOSIS — M5416 Radiculopathy, lumbar region: Secondary | ICD-10-CM | POA: Diagnosis not present

## 2020-10-30 MED ORDER — ONDANSETRON HCL 4 MG PO TABS: 4.0000 mg | ORAL_TABLET | Freq: Three times a day (TID) | ORAL | 0 refills | Status: DC | PRN

## 2020-10-30 NOTE — Patient Instructions (Signed)
Colitis  Colitis is a condition in which the colon is inflamed. It can cause diarrhea, blood in the stool, and abdominal pain. Colitis can last a short time (be acute), or it may last a long time (become chronic). What are the causes? This condition may be caused by:  Infections from viruses or bacteria.  A reaction to medicine.  Certain autoimmune diseases, such as Crohn's disease or ulcerative colitis.  Radiation treatment.  Decreased blood flow to the bowel (ischemia). What are the signs or symptoms? Symptoms of this condition include:  Diarrhea, blood in the stool, or black, tarry stool.  Pain in the joints or abdominal pain.  Fever or fatigue.  Vomiting.  Weight loss.  Bloating.  Having fewer bowel movements than usual.  A strong and sudden urge to have a bowel movement.  Feeling like the bowel is not empty after a bowel movement. How is this diagnosed? This condition may be diagnosed based on a stool test and a blood test. You may also have other tests, such as:  X-rays.  CT scan.  Colonoscopy.  Endoscopy.  Biopsy. How is this treated? Treatment for this condition depends on the cause. This condition may be treated with:  Steps to rest the bowel, such as not eating or drinking for a period of time.  Fluids that are given through an IV.  Medicine for pain and diarrhea.  Antibiotic medicines.  Cortisone medicines.  Surgery. Follow these instructions at home: Eating and drinking  Follow instructions from your health care provider about eating or drinking restrictions.  Drink enough fluid to keep your urine pale yellow.  Work with a dietitian to determine whether certain foods cause your condition to flare up.  Avoid foods or drinks that cause flare-ups.  Eat a well-balanced diet.   General instructions  If you were prescribed an antibiotic medicine, take it as told by your health care provider. Do not stop taking the antibiotic even if you  start to feel better.  Take over-the-counter and prescription medicines only as told by your health care provider.  Keep all follow-up visits. This is important. Contact a health care provider if:  Your symptoms do not go away.  You develop new symptoms. Get help right away if:  You have a fever that does not go away with treatment.  You develop chills.  You have extreme weakness, fainting, or dehydration.  You vomit repeatedly.  You develop severe pain in your abdomen.  You pass bloody or tarry stool. Summary  Colitis is a condition in which the colon is inflamed. Colitis can last a short time (be acute), or it may last a long time (become chronic).  Treatment for this condition depends on the cause and may include resting the bowel, taking medicines, or having surgery.  If you were prescribed an antibiotic medicine, take it as told by your health care provider. Do not stop taking the antibiotic even if you start to feel better.  Get help right away if you develop severe pain in your abdomen.  Keep all follow-up visits. This is important. This information is not intended to replace advice given to you by your health care provider. Make sure you discuss any questions you have with your health care provider. Document Revised: 03/05/2020 Document Reviewed: 03/05/2020 Elsevier Patient Education  2021 [employer] Inc.  

## 2020-10-30 NOTE — Progress Notes (Signed)
Established patient visit   Patient: Carrie Alvarez   DOB: 1951-02-23   70 y.o. Female  MRN: [id number] Visit Date: 10/30/2020  Today's healthcare provider: Laurita Quint Elisavet Buehrer, FNP   Chief Complaint  Patient presents with  . Follow-up    ER follow up Colitis    Subjective    HPI HPI    Follow-up     Additional comments: ER follow up Colitis        Last edited by Kizzie Furnish, CMA on 10/30/2020  2:50 PM. (History)      Follow up ER visit  Patient was seen in ER for abdominal pain on 10/20/2020. She was treated for Collitis. Treatment for this included:    AMOXICILLIN-CLAVULANATE (AUGMENTIN) 875-125 MG TABLET    Take 1 tablet by mouth 2 (two) times daily for 10 days.   ONDANSETRON (ZOFRAN ODT) 8 MG DISINTEGRATING TABLET    Take 1 tablet (8 mg total) by mouth every 8 (eight) hours as needed for nausea or vomiting.   TRAMADOL (ULTRAM) 50 MG TABLET    Take 1 tablet (50 mg total) by mouth every 6 (six) hours as needed for up to 5 days.    She reports excellent compliance with treatment. She reports this condition is Improved, but not completely better. Didn't pick up the tramadol On last day of antibiotics  Still having abdominal pain and nausea Feels like could throw up all the time Zofran made the nausea worse Has been working on increasing fluid intake (water and Gatorade) Eating simple and bland foods  Symptoms are better than in the ED Needs a form faxed to pain doctor saying she can get pain medications  -----------------------------------------------------------------------------------------   Patient Active Problem List   Diagnosis Date Noted  . Polyp of descending colon   . MLH1-related Lynch syndrome (HNPCC2) 06/01/2019  . Genetic testing 06/01/2019  . Family history of breast cancer   . Family history of colon cancer   . Goals of care, counseling/discussion 01/22/2019  . Colon cancer ([place]) 01/10/2019  . Personal history of colonic  polyps   . Polyp of transverse colon   . Neoplasm of digestive system   . Morbid obesity ([place]) 06/07/2018  . Acute cholecystitis   . Transaminitis   . Cholangitis 04/18/2016  . Allergic rhinitis 09/04/2015  . AA (alopecia areata) 09/04/2015  . Arthritis, degenerative 09/04/2015  . Cancer of uterus ([place]) 09/04/2015  . Avitaminosis D 09/04/2015  . Benign positional vertigo 09/04/2015  . Hypertension 03/15/2015  . DDD (degenerative disc disease), lumbar 03/07/2015  . Dizziness 12/25/2014  . Lumbosacral facet joint syndrome 12/10/2014  . Sacroiliac joint dysfunction 12/10/2014  . DDD (degenerative disc disease), lumbosacral 11/13/2014  . Depression 10/13/2014  . Breast cancer metastasized to axillary lymph node ([place]) 06/21/2014  . Hypercholesteremia 08/30/2009  . Compulsive tobacco user syndrome 04/21/2008  . Adaptation reaction 07/18/2007   Past Medical History:  Diagnosis Date  . Allergy   . Anemia   . Back pain   . Breast cancer ([place]) 2015   LT LUMPECTOMY 12-2014 FOLLOWING CHEMO  . Carpal tunnel syndrome    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  . Cataract   . Chronic pain   . Colon cancer ([place]) 01/2019  . Depression   . Dyspnea   . Family history of breast cancer   . Family history of colon cancer   . GERD (gastroesophageal reflux disease)    problem with reflux during chemo  .  History of methicillin resistant staphylococcus aureus (MRSA)   . Hypercholesteremia   . Hypertension   . Lumbosacral pain    sees pain management in [place]  . Obesity   . Personal history of chemotherapy 2016   left breast ca  . Personal history of chemotherapy 2020   colon ca  . Pinched nerve    left side, lumbar area  . Uterine cancer ([place]) 1994   Social History   Tobacco Use  . Smoking status: Former Smoker    Packs/day: 0.50    Types: Cigarettes    Quit date: 07/18/2014    Years since quitting: 6.2  . Smokeless tobacco: Never Used  Vaping Use  . Vaping Use: Never used   Substance Use Topics  . Alcohol use: No    Alcohol/week: 0.0 standard drinks  . Drug use: No   No Known Allergies   Medications: Outpatient Medications Prior to Visit  Medication Sig  . acetaminophen (TYLENOL) 500 MG tablet Take 500 mg by mouth every 6 (six) hours as needed for mild pain or moderate pain.   Marland Kitchen albuterol (VENTOLIN HFA) 108 (90 Base) MCG/ACT inhaler Inhale 2 puffs into the lungs every 6 (six) hours as needed for wheezing or shortness of breath.  Marland Kitchen amLODipine (NORVASC) 5 MG tablet Take 1 tablet by mouth once daily  . amoxicillin-clavulanate (AUGMENTIN) 875-125 MG tablet Take 1 tablet by mouth 2 (two) times daily for 10 days.  Marland Kitchen atorvastatin (LIPITOR) 10 MG tablet TAKE 1 TABLET BY MOUTH IN THE MORNING  . baclofen (LIORESAL) 10 MG tablet Take 1 tablet (10 mg total) by mouth 2 (two) times daily.  . cetirizine (ZYRTEC) 10 MG tablet Take 10 mg by mouth daily at 6 (six) AM.  . cholecalciferol (VITAMIN D) 1000 units tablet Take 1,000 Units by mouth daily.  . fluticasone (FLONASE) 50 MCG/ACT nasal spray Use 2 spray(s) in each nostril once daily  . gabapentin (NEURONTIN) 300 MG capsule Take 300-600 mg by mouth 4 (four) times daily as needed (neuropathic pain).   Marland Kitchen letrozole (FEMARA) 2.5 MG tablet Take 2.5 mg by mouth daily.   Marland Kitchen NARCAN 4 MG/0.1ML LIQD nasal spray kit Place 1 spray into the nose.   Marland Kitchen Oxycodone HCl 10 MG TABS LIMIT ONE HALF TO ONE TABLET BY MOUTH 3 TO 5 TIMES DAILY IF TOLERATED  . predniSONE (DELTASONE) 20 MG tablet Take 1 tablet (20 mg total) by mouth daily with breakfast.  . PREVIDENT 5000 DRY MOUTH 1.1 % GEL dental gel Place onto teeth at bedtime.  . pyridoxine (B-6) 100 MG tablet Take 100 mg by mouth daily.  . sertraline (ZOLOFT) 100 MG tablet Take 2 tablets by mouth once daily  . [DISCONTINUED] diclofenac Sodium (VOLTAREN) 1 % GEL Apply 4 g topically 4 (four) times daily.  . [DISCONTINUED] ondansetron (ZOFRAN ODT) 8 MG disintegrating tablet Take 1 tablet (8 mg  total) by mouth every 8 (eight) hours as needed for nausea or vomiting.  . [DISCONTINUED] vitamin B-12 (CYANOCOBALAMIN) 500 MCG tablet Take 500 mcg by mouth daily.   Facility-Administered Medications Prior to Visit  Medication Dose Route Frequency Provider  . heparin lock flush 100 unit/mL  500 Units Intravenous Once [name], MD  . heparin lock flush 100 unit/mL  500 Units Intravenous Once [name], MD  . sodium chloride flush (NS) 0.9 % injection 10 mL  10 mL Intravenous PRN Grayland Ormond Kathlene November, MD    Review of Systems  Constitutional: Positive for appetite  change (No appetitie) and fever. Negative for activity change, chills, diaphoresis, fatigue and unexpected weight change.  Respiratory: Negative.   Cardiovascular: Negative.   Gastrointestinal: Positive for abdominal distention, abdominal pain and nausea. Negative for anal bleeding, blood in stool, constipation, diarrhea, rectal pain and vomiting.  Neurological: Positive for dizziness. Negative for light-headedness and headaches.        Objective    BP 128/89 (BP Location: Right Arm, Patient Position: Sitting, Cuff Size: Large)   Pulse 96   Temp 98.2 F (36.8 C) (Oral)   Wt 199 lb (90.3 kg)   BMI 35.25 kg/m    Physical Exam Constitutional:      General: She is not in acute distress.    Appearance: Normal appearance. She is not ill-appearing.  HENT:     Head: Normocephalic.  Cardiovascular:     Rate and Rhythm: Normal rate and regular rhythm.     Pulses: Normal pulses.     Heart sounds: Normal heart sounds. No murmur heard. No friction rub. No gallop.   Pulmonary:     Effort: Pulmonary effort is normal. No respiratory distress.     Breath sounds: Normal breath sounds. No stridor. No wheezing, rhonchi or rales.  Abdominal:     General: Bowel sounds are normal. There is no distension.     Palpations: Abdomen is soft. There is no mass.     Tenderness: There is no abdominal tenderness. There is no  guarding or rebound.  Musculoskeletal:     Right lower leg: No edema.     Left lower leg: No edema.  Skin:    General: Skin is warm and dry.  Neurological:     Mental Status: She is alert and oriented to person, place, and time.  Psychiatric:        Mood and Affect: Mood normal.        Behavior: Behavior normal.      No results found for any visits on 10/30/20.  Assessment & Plan     Problem List Items Addressed This Visit   None   Visit Diagnoses    Nausea without vomiting    -  Primary   Relevant Medications   ondansetron (ZOFRAN) 4 MG tablet   Acute colitis         Plan Continue with zofran as needed for nausea Increase fluids as able Bland diet as able If no improvement seen in a week follow up at the clinic Note provided to pain doctor as requested  Return in about 3 months (around 01/29/2021).       [place], [place] [phone] (phone) [phone] (fax)  [place]

## 2020-11-04 DIAGNOSIS — M5416 Radiculopathy, lumbar region: Secondary | ICD-10-CM | POA: Diagnosis not present

## 2020-11-04 DIAGNOSIS — M47816 Spondylosis without myelopathy or radiculopathy, lumbar region: Secondary | ICD-10-CM | POA: Diagnosis not present

## 2020-11-06 DIAGNOSIS — M5416 Radiculopathy, lumbar region: Secondary | ICD-10-CM | POA: Diagnosis not present

## 2020-11-06 DIAGNOSIS — M47816 Spondylosis without myelopathy or radiculopathy, lumbar region: Secondary | ICD-10-CM | POA: Diagnosis not present

## 2020-11-11 DIAGNOSIS — M542 Cervicalgia: Secondary | ICD-10-CM | POA: Diagnosis not present

## 2020-11-11 DIAGNOSIS — M545 Low back pain, unspecified: Secondary | ICD-10-CM | POA: Diagnosis not present

## 2020-11-11 DIAGNOSIS — G894 Chronic pain syndrome: Secondary | ICD-10-CM | POA: Diagnosis not present

## 2020-11-12 DIAGNOSIS — H2512 Age-related nuclear cataract, left eye: Secondary | ICD-10-CM | POA: Diagnosis not present

## 2020-11-13 DIAGNOSIS — M5416 Radiculopathy, lumbar region: Secondary | ICD-10-CM | POA: Diagnosis not present

## 2020-11-13 DIAGNOSIS — M47816 Spondylosis without myelopathy or radiculopathy, lumbar region: Secondary | ICD-10-CM | POA: Diagnosis not present

## 2020-11-14 DIAGNOSIS — M47816 Spondylosis without myelopathy or radiculopathy, lumbar region: Secondary | ICD-10-CM | POA: Diagnosis not present

## 2020-11-20 ENCOUNTER — Encounter: Payer: Self-pay | Admitting: Ophthalmology

## 2020-11-20 ENCOUNTER — Encounter: Payer: Self-pay | Admitting: Physician Assistant

## 2020-11-21 ENCOUNTER — Ambulatory Visit (INDEPENDENT_AMBULATORY_CARE_PROVIDER_SITE_OTHER): Payer: Medicare HMO | Admitting: Family Medicine

## 2020-11-21 ENCOUNTER — Encounter: Payer: Self-pay | Admitting: Family Medicine

## 2020-11-21 ENCOUNTER — Other Ambulatory Visit: Payer: Self-pay

## 2020-11-21 VITALS — BP 97/68 | HR 76 | Temp 97.9°F | Resp 18 | Ht 63.0 in | Wt 200.6 lb

## 2020-11-21 DIAGNOSIS — Z8542 Personal history of malignant neoplasm of other parts of uterus: Secondary | ICD-10-CM | POA: Diagnosis not present

## 2020-11-21 DIAGNOSIS — Z85038 Personal history of other malignant neoplasm of large intestine: Secondary | ICD-10-CM

## 2020-11-21 DIAGNOSIS — M5137 Other intervertebral disc degeneration, lumbosacral region: Secondary | ICD-10-CM

## 2020-11-21 DIAGNOSIS — I10 Essential (primary) hypertension: Secondary | ICD-10-CM

## 2020-11-21 DIAGNOSIS — Z853 Personal history of malignant neoplasm of breast: Secondary | ICD-10-CM | POA: Diagnosis not present

## 2020-11-21 DIAGNOSIS — Z23 Encounter for immunization: Secondary | ICD-10-CM

## 2020-11-21 DIAGNOSIS — Z0000 Encounter for general adult medical examination without abnormal findings: Secondary | ICD-10-CM

## 2020-11-21 DIAGNOSIS — R197 Diarrhea, unspecified: Secondary | ICD-10-CM

## 2020-11-21 NOTE — Progress Notes (Signed)
Complete physical exam   Patient: Carrie Alvarez   DOB: [birth year]   70 y.o. Female  MRN: [id number] Visit Date: 11/21/2020  Today's healthcare provider: Vernie Murders, PA-C   Chief Complaint  Patient presents with   Annual Exam   Subjective    Carrie Alvarez is a 70 y.o. female who presents today for a complete physical exam.  She reports consuming a general diet. The patient does not participate in regular exercise at present. She generally feels fairly well. She reports sleeping fairly well. She does have additional problems to discuss today.   Had AWV with [facility] on 10/03/2020.   HPI  Diarrhea:  Patient complains of diarrhea and nausea. She was seen in the ER on 10/20/20 for colitis. Patient states this problem has not improved since onset.   Past Medical History:  Diagnosis Date   Allergy    Anemia    Arthritis    Back pain    Breast cancer ([place]) 2015   LT LUMPECTOMY 12-2014 FOLLOWING CHEMO   Carpal tunnel syndrome    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   Cataract    Chronic pain    Colon cancer ([place]) 01/2019   Depression    Dyspnea    Family history of breast cancer    Family history of colon cancer    GERD (gastroesophageal reflux disease)    problem with reflux during chemo   History of methicillin resistant staphylococcus aureus (MRSA)    Hypercholesteremia    Hypertension    Lumbosacral pain    sees pain management in [place]   Obesity    Personal history of chemotherapy 2016   left breast ca   Personal history of chemotherapy 2020   colon ca   Pinched nerve    left side, lumbar area   Uterine cancer ([place]) 1994   Past Surgical History:  Procedure Laterality Date   ABDOMINAL HYSTERECTOMY  1994   UTERINE CA   AXILLARY LYMPH NODE BIOPSY Left 12/18/2014   Procedure: AXILLARY LYMPH NODE BIOPSY/;  Surgeon: Robert Bellow, MD;  Location: [facility];  Service: General;  Laterality: Left;   AXILLARY LYMPH NODE DISSECTION Left  12/18/2014   Procedure: AXILLARY LYMPH NODE DISSECTION;  Surgeon: Robert Bellow, MD;  Location: [facility];  Service: General;  Laterality: Left;   BREAST BIOPSY Left 05/2014   CORE BX OF LN, METASTATIC ADENOCARCINOMA   BREAST LUMPECTOMY Left 12/2014   CHEMO FIRST THEN SURGERY OF LN   BREAST SURGERY     lymph node removal   CHOLECYSTECTOMY N/A 04/19/2016   Procedure: LAPAROSCOPIC CHOLECYSTECTOMY WITH INTRAOPERATIVE CHOLANGIOGRAM;  Surgeon: Hubbard Robinson, MD;  Location: [facility];  Service: General;  Laterality: N/A;   COLON RESECTION Right 01/10/2019   Procedure: HAND ASSISTED LAPAROSCOPIC RIGHT COLON RESECTION;  Surgeon: Jules Husbands, MD;  Location: [facility];  Service: General;  Laterality: Right;   COLONOSCOPY WITH PROPOFOL N/A 12/30/2018   Procedure: COLONOSCOPY WITH PROPOFOL;  Surgeon: Lucilla Lame, MD;  Location: [facility];  Service: Endoscopy;  Laterality: N/A;   COLONOSCOPY WITH PROPOFOL N/A 03/05/2020   Procedure: COLONOSCOPY WITH PROPOFOL;  Surgeon: Lucilla Lame, MD;  Location: [facility];  Service: Endoscopy;  Laterality: N/A;   medial branch block  11/06/2015   lumbar facet Dr. Primus Bravo   POLYPECTOMY N/A 12/30/2018   Procedure: POLYPECTOMY;  Surgeon: Lucilla Lame, MD;  Location: [facility];  Service: Endoscopy;  Laterality: N/A;  Clips  placed at Hepatic Flexure Polyp (2) and Transverse Colon Polyp (4) removal sites   PORTACATH PLACEMENT Right 02/02/2019   Procedure: INSERTION PORT-A-CATH;  Surgeon: Jules Husbands, MD;  Location: [facility];  Service: General;  Laterality: Right;   RADIOFREQUENCY ABLATION     lumbar   SENTINEL NODE BIOPSY Left 12/18/2014   Procedure: SENTINEL NODE BIOPSY;  Surgeon: Robert Bellow, MD;  Location: [facility];  Service: General;  Laterality: Left;   Social History   Socioeconomic History   Marital status: Married    Spouse name: Vicente Serene   Number of children: 3   Years of education: Not on file   Highest education level: Some  college, no degree  Occupational History   Occupation: retired    Comment: was on disability  Tobacco Use   Smoking status: Former Smoker    Packs/day: 0.50    Types: Cigarettes    Quit date: 07/18/2014    Years since quitting: 6.3   Smokeless tobacco: Never Used  Vaping Use   Vaping Use: Never used  Substance and Sexual Activity   Alcohol use: No    Alcohol/week: 0.0 standard drinks   Drug use: No   Sexual activity: Not on file  Other Topics Concern   Not on file  Social History Narrative   Husband and patient have not lived together for 10 years.  He is still helpful with patient.   Social Determinants of Health   Financial Resource Strain: Low Risk    Difficulty of Paying Living Expenses: Not hard at all  Food Insecurity: No Food Insecurity   Worried About Charity fundraiser in the Last Year: Never true   [place] in the Last Year: Never true  Transportation Needs: No Transportation Needs   Lack of Transportation (Medical): No   Lack of Transportation (Non-Medical): No  Physical Activity: Inactive   Days of Exercise per Week: 0 days   Minutes of Exercise per Session: 0 min  Stress: No Stress Concern Present   Feeling of Stress : Not at all  Social Connections: Moderately Integrated   Frequency of Communication with Friends and Family: Once a week   Frequency of Social Gatherings with Friends and Family: More than three times a week   Attends Religious Services: More than 4 times per year   Active Member of [employer] or Organizations: No   Attends Archivist Meetings: Never   Marital Status: Married  Human resources officer Violence: Not At Risk   Fear of Current or Ex-Partner: No   Emotionally Abused: No   Physically Abused: No   Sexually Abused: No   Family Status  Relation Name Status   Sister  Alive   Mother  Deceased       fell- had spine sx, dependent. Pt is caregiver.   Father  Deceased   Brother  Alive   [place]  Deceased   Mat Uncle x3  Deceased   Pat Aunt x2 Deceased   Pat Uncle x3 Deceased   MGM  Deceased   MGF  Deceased   PGM  Deceased   PGF  Deceased   Sister  Alive   Sister  Alive   Cousin maternal Alive   Cousin maternal 59   Son  Alive   Son  Alive   Daughter  Alive   Family History  Problem Relation Age of Onset   Breast cancer Sister 79   Colon cancer Sister    Colon  cancer Mother        dx 47s   Hypertension Mother    Asthma Mother    Cancer Father        voice box removed   Cancer Maternal Grandmother        unsure type   Colon cancer Cousin    Cancer Cousin        unsure type   No Known Allergies  Patient Care Team: Zian Mohamed, Vickki Muff, PA-C as PCP - General (Family Medicine) [name], MD as Consulting Physician (Oncology) [name], MD as Attending Physician (Pain Medicine) [name]., MD as Consulting Physician (Ophthalmology) [name], RN as Oncology Nurse Navigator [name], [name], MD as Referring Physician (Anesthesiology)   Medications: Outpatient Medications Prior to Visit  Medication Sig   acetaminophen (TYLENOL) 500 MG tablet Take 500 mg by mouth every 6 (six) hours as needed for mild pain or moderate pain.    albuterol (VENTOLIN HFA) 108 (90 Base) MCG/ACT inhaler Inhale 2 puffs into the lungs every 6 (six) hours as needed for wheezing or shortness of breath.   amLODipine (NORVASC) 5 MG tablet Take 1 tablet by mouth once daily   atorvastatin (LIPITOR) 10 MG tablet TAKE 1 TABLET BY MOUTH IN THE MORNING   baclofen (LIORESAL) 10 MG tablet Take 1 tablet (10 mg total) by mouth 2 (two) times daily.   cetirizine (ZYRTEC) 10 MG tablet Take 10 mg by mouth daily at 6 (six) AM.   cholecalciferol (VITAMIN D) 1000 units tablet Take 1,000 Units by mouth daily.   fluticasone (FLONASE) 50 MCG/ACT nasal spray Use 2 spray(s) in each nostril once daily   gabapentin (NEURONTIN) 300 MG capsule Take 300-600 mg by mouth 4 (four) times daily as needed  (neuropathic pain).    letrozole (FEMARA) 2.5 MG tablet Take 2.5 mg by mouth daily.    NARCAN 4 MG/0.1ML LIQD nasal spray kit Place 1 spray into the nose.    ondansetron (ZOFRAN) 4 MG tablet Take 1 tablet (4 mg total) by mouth every 8 (eight) hours as needed for nausea or vomiting.   Oxycodone HCl 10 MG TABS LIMIT ONE HALF TO ONE TABLET BY MOUTH 3 TO 5 TIMES DAILY IF TOLERATED   PREVIDENT 5000 DRY MOUTH 1.1 % GEL dental gel Place onto teeth at bedtime.   pyridoxine (B-6) 100 MG tablet Take 100 mg by mouth daily.   sertraline (ZOLOFT) 100 MG tablet Take 2 tablets by mouth once daily   vitamin B-12 (CYANOCOBALAMIN) 500 MCG tablet Take 500 mcg by mouth daily.   [DISCONTINUED] predniSONE (DELTASONE) 20 MG tablet Take 1 tablet (20 mg total) by mouth daily with breakfast. (Patient not taking: Reported on 11/21/2020)   Facility-Administered Medications Prior to Visit  Medication Dose Route Frequency Provider   heparin lock flush 100 unit/mL  500 Units Intravenous Once [name], MD   heparin lock flush 100 unit/mL  500 Units Intravenous Once [name], MD   sodium chloride flush (NS) 0.9 % injection 10 mL  10 mL Intravenous PRN [name], MD    Review of Systems  Constitutional: Negative for chills, fatigue and fever.  HENT: Negative for congestion, ear pain, rhinorrhea, sneezing and sore throat.   Eyes: Positive for itching. Negative for pain and redness.  Respiratory: Negative for cough, shortness of breath and wheezing.   Cardiovascular: Negative for chest pain and leg swelling.  Gastrointestinal: Positive for diarrhea and nausea. Negative for abdominal pain,  blood in stool and constipation.  Endocrine: Negative for polydipsia and polyphagia.  Genitourinary: Negative.  Negative for dysuria, flank pain, hematuria, pelvic pain, vaginal bleeding and vaginal discharge.  Musculoskeletal: Positive for back pain. Negative for arthralgias, gait problem and joint swelling.   Skin: Negative for rash.  Neurological: Negative.  Negative for dizziness, tremors, seizures, weakness, light-headedness, numbness and headaches.  Hematological: Negative for adenopathy.  Psychiatric/Behavioral: Negative.  Negative for behavioral problems, confusion and dysphoric mood. The patient is not nervous/anxious and is not hyperactive.        Objective    BP 97/68 (BP Location: Right Arm, Patient Position: Sitting, Cuff Size: Large)   Pulse 76   Temp 97.9 F (36.6 C) (Temporal)   Resp 18   Ht 5' 3"  (1.6 m)   Wt 200 lb 9.6 oz (91 kg)   BMI 35.53 kg/m    BP Readings from Last 3 Encounters:  11/21/20 97/68  10/30/20 128/89  10/20/20 (!) 144/101   Wt Readings from Last 3 Encounters:  11/21/20 200 lb 9.6 oz (91 kg)  10/30/20 199 lb (90.3 kg)  10/20/20 208 lb (94.3 kg)      Physical Exam Constitutional:      Appearance: She is well-developed.  HENT:     Head: Normocephalic and atraumatic.     Right Ear: External ear normal.     Left Ear: External ear normal.     Nose: Nose normal.  Eyes:     General:        Right eye: No discharge.     Conjunctiva/sclera: Conjunctivae normal.     Pupils: Pupils are equal, round, and reactive to light.  Neck:     Thyroid: No thyromegaly.     Trachea: No tracheal deviation.  Cardiovascular:     Rate and Rhythm: Normal rate and regular rhythm.     Heart sounds: Normal heart sounds. No murmur heard.   Pulmonary:     Effort: Pulmonary effort is normal. No respiratory distress.     Breath sounds: Normal breath sounds. No wheezing or rales.  Chest:     Chest wall: No tenderness.  Abdominal:     General: There is no distension.     Palpations: Abdomen is soft. There is no mass.     Tenderness: There is no abdominal tenderness. There is no guarding or rebound.  Musculoskeletal:        General: No tenderness. Normal range of motion.     Cervical back: Normal range of motion and neck supple.  Lymphadenopathy:     Cervical: No  cervical adenopathy.  Skin:    General: Skin is warm and dry.     Findings: No erythema or rash.  Neurological:     Mental Status: She is alert and oriented to person, place, and time.     Cranial Nerves: No cranial nerve deficit.     Motor: No abnormal muscle tone.     Coordination: Coordination normal.     Deep Tendon Reflexes: Reflexes are normal and symmetric. Reflexes normal.  Psychiatric:        Behavior: Behavior normal.        Thought Content: Thought content normal.        Judgment: Judgment normal.       Last depression screening scores PHQ 2/9 Scores 11/21/2020 10/30/2020 10/03/2020  PHQ - 2 Score 1 0 0  PHQ- 9 Score 3 6 -  Exception Documentation - - -   Last fall risk  screening Fall Risk  11/21/2020  Falls in the past year? 0  Comment -  Number falls in past yr: 0  Injury with Fall? 0  Risk for fall due to : -  Follow up Falls evaluation completed   Last Audit-C alcohol use screening Alcohol Use Disorder Test (AUDIT) 11/21/2020  1. How often do you have a drink containing alcohol? 0  2. How many drinks containing alcohol do you have on a typical day when you are drinking? 0  3. How often do you have six or more drinks on one occasion? 0  AUDIT-C Score 0  Alcohol Brief Interventions/Follow-up -   A score of 3 or more in women, and 4 or more in men indicates increased risk for alcohol abuse, EXCEPT if all of the points are from question 1   No results found for any visits on 11/21/20.  Assessment & Plan    Routine Health Maintenance and Physical Exam  Exercise Activities and Dietary recommendations Goals      DIET - REDUCE PORTION SIZE     Recommend to decrease portion sizes by eating 3 small healthy meals and at least 2 healthy snacks per day.        Immunization History  Administered Date(s) Administered   Fluad Quad(high Dose 65+) 03/23/2019, 06/25/2020   Influenza, High Dose Seasonal PF 06/07/2017, 06/07/2018   Influenza,inj,Quad PF,6+ Mos  04/20/2016   PFIZER(Purple Top)SARS-COV-2 Vaccination 09/06/2019, 09/27/2019, 06/25/2020   Pneumococcal Conjugate-13 09/22/2016   Pneumococcal Polysaccharide-23 09/28/2017    Health Maintenance  Topic Date Due   COVID-19 Vaccine (4 - Booster for Pfizer series) 09/23/2020   TETANUS/TDAP  07/13/2026 (Originally 09/21/1969)   INFLUENZA VACCINE  02/10/2021   MAMMOGRAM  07/04/2022   COLONOSCOPY (Pts 45-61yr Insurance coverage will need to be confirmed)  03/05/2025   DEXA SCAN  Completed   Hepatitis C Screening  Completed   PNA vac Low Risk Adult  Completed   HPV VACCINES  Aged Out    Discussed health benefits of physical activity, and encouraged her to engage in regular exercise appropriate for her age and condition.  1. Annual physical exam  - CBC with Differential/Platelet - Comprehensive metabolic panel - Lipid panel - TSH  2. Essential hypertension  - CBC with Differential/Platelet - Comprehensive metabolic panel - Lipid panel - TSH  3. DDD (degenerative disc disease), lumbosacral   4. History of breast cancer  - CBC with Differential/Platelet - Comprehensive metabolic panel  5. History of colon cancer  - CBC with Differential/Platelet - Comprehensive metabolic panel  6. History of uterine cancer  - CBC with Differential/Platelet - Comprehensive metabolic panel  7. Diarrhea, unspecified type  - CBC with Differential/Platelet - Comprehensive metabolic panel  8. Need for COVID-19 vaccine   9. Encounter for immunization  - PFIZER Comirnaty(GRAY TOP)COVID-19 Vaccine  No follow-ups on file.   Am the supervising physician for DFannie Knee PA and I am signing off on this unfinished note with no further clinical information RWilhemena Durie, MD   I, DVernie Murders PA-C, have reviewed all documentation for this visit. The documentation on 11/21/20 for the exam, diagnosis, procedures, and orders are all accurate and complete.    DVernie Murders PA-C  [phone](phone) [phone](fax)  [place]

## 2020-11-22 LAB — LIPID PANEL
Chol/HDL Ratio: 2 ratio (ref 0.0–4.4)
Cholesterol, Total: 123 mg/dL (ref 100–199)
HDL: 61 mg/dL (ref >39)
LDL Chol Calc (NIH): 51 mg/dL (ref 0–99)
Triglycerides: 48 mg/dL (ref 0–149)
VLDL Cholesterol Cal: 11 mg/dL (ref 5–40)

## 2020-11-22 LAB — CBC WITH DIFFERENTIAL/PLATELET
Basophils Absolute: 0 10*3/uL (ref 0.0–0.2)
Basos: 1 %
EOS (ABSOLUTE): 0.2 10*3/uL (ref 0.0–0.4)
Eos: 3 %
Hematocrit: 34.3 % (ref 34.0–46.6)
Hemoglobin: 11.6 g/dL (ref 11.1–15.9)
Immature Grans (Abs): 0 10*3/uL (ref 0.0–0.1)
Immature Granulocytes: 0 %
Lymphocytes Absolute: 1.9 10*3/uL (ref 0.7–3.1)
Lymphs: 23 %
MCH: 29 pg (ref 26.6–33.0)
MCHC: 33.8 g/dL (ref 31.5–35.7)
MCV: 86 fL (ref 79–97)
Monocytes Absolute: 0.5 10*3/uL (ref 0.1–0.9)
Monocytes: 6 %
Neutrophils Absolute: 5.4 10*3/uL (ref 1.4–7.0)
Neutrophils: 67 %
Platelets: 269 10*3/uL (ref 150–450)
RBC: 4 x10E6/uL (ref 3.77–5.28)
RDW: 13.9 % (ref 11.7–15.4)
WBC: 8.1 10*3/uL (ref 3.4–10.8)

## 2020-11-22 LAB — COMPREHENSIVE METABOLIC PANEL
ALT: 13 IU/L (ref 0–32)
AST: 16 IU/L (ref 0–40)
Albumin/Globulin Ratio: 1.5 (ref 1.2–2.2)
Albumin: 4.1 g/dL (ref 3.8–4.8)
Alkaline Phosphatase: 96 IU/L (ref 44–121)
BUN/Creatinine Ratio: 10 — ABNORMAL LOW (ref 12–28)
BUN: 12 mg/dL (ref 8–27)
Bilirubin Total: 0.6 mg/dL (ref 0.0–1.2)
CO2: 22 mmol/L (ref 20–29)
Calcium: 9.7 mg/dL (ref 8.7–10.3)
Chloride: 105 mmol/L (ref 96–106)
Creatinine, Ser: 1.19 mg/dL — ABNORMAL HIGH (ref 0.57–1.00)
Globulin, Total: 2.8 g/dL (ref 1.5–4.5)
Glucose: 84 mg/dL (ref 65–99)
Potassium: 4.5 mmol/L (ref 3.5–5.2)
Sodium: 141 mmol/L (ref 134–144)
Total Protein: 6.9 g/dL (ref 6.0–8.5)
eGFR: 49 mL/min/{1.73_m2} — ABNORMAL LOW (ref >59)

## 2020-11-22 LAB — TSH: TSH: 1.29 u[IU]/mL (ref 0.450–4.500)

## 2020-11-25 NOTE — Discharge Instructions (Signed)

## 2020-11-26 ENCOUNTER — Other Ambulatory Visit: Payer: Self-pay

## 2020-11-26 ENCOUNTER — Ambulatory Visit: Payer: Medicare HMO | Admitting: Anesthesiology

## 2020-11-26 ENCOUNTER — Ambulatory Visit
Admission: RE | Admit: 2020-11-26 | Discharge: 2020-11-26 | Disposition: A | Discharge status: Home / Self Care | Payer: Medicare HMO | Attending: Ophthalmology | Admitting: Ophthalmology

## 2020-11-26 ENCOUNTER — Encounter
Admission: RE | Disposition: A | Discharge status: Home / Self Care | Payer: Self-pay | Source: Home / Self Care | Attending: Ophthalmology

## 2020-11-26 DIAGNOSIS — Z90710 Acquired absence of both cervix and uterus: Secondary | ICD-10-CM | POA: Diagnosis not present

## 2020-11-26 DIAGNOSIS — Z79899 Other long term (current) drug therapy: Secondary | ICD-10-CM | POA: Insufficient documentation

## 2020-11-26 DIAGNOSIS — Z853 Personal history of malignant neoplasm of breast: Secondary | ICD-10-CM | POA: Diagnosis not present

## 2020-11-26 DIAGNOSIS — Z9221 Personal history of antineoplastic chemotherapy: Secondary | ICD-10-CM | POA: Diagnosis not present

## 2020-11-26 DIAGNOSIS — Z87891 Personal history of nicotine dependence: Secondary | ICD-10-CM | POA: Diagnosis not present

## 2020-11-26 DIAGNOSIS — Z9049 Acquired absence of other specified parts of digestive tract: Secondary | ICD-10-CM | POA: Diagnosis not present

## 2020-11-26 DIAGNOSIS — H2512 Age-related nuclear cataract, left eye: Secondary | ICD-10-CM | POA: Insufficient documentation

## 2020-11-26 DIAGNOSIS — Z8542 Personal history of malignant neoplasm of other parts of uterus: Secondary | ICD-10-CM | POA: Insufficient documentation

## 2020-11-26 DIAGNOSIS — H25812 Combined forms of age-related cataract, left eye: Secondary | ICD-10-CM | POA: Diagnosis not present

## 2020-11-26 HISTORY — PX: CATARACT EXTRACTION W/PHACO: SHX586

## 2020-11-26 HISTORY — DX: Unspecified osteoarthritis, unspecified site: M19.90

## 2020-11-26 SURGERY — PHACOEMULSIFICATION, CATARACT, WITH IOL INSERTION
Anesthesia: General | Site: Eye | Laterality: Left

## 2020-11-26 MED ORDER — LACTATED RINGERS IV SOLN: INTRAVENOUS | Status: DC

## 2020-11-26 MED ORDER — LIDOCAINE HCL (PF) 2 % IJ SOLN
INTRAOCULAR | Status: DC | PRN
  Administered 2020-11-26: 1 mL via INTRAMUSCULAR

## 2020-11-26 MED ORDER — TETRACAINE HCL 0.5 % OP SOLN
1.0000 [drp] | OPHTHALMIC | Status: DC | PRN
  Administered 2020-11-26 (×3): 1 [drp] via OPHTHALMIC

## 2020-11-26 MED ORDER — EPINEPHRINE PF 1 MG/ML IJ SOLN
INTRAOCULAR | Status: DC | PRN
  Administered 2020-11-26: 68 mL via OPHTHALMIC

## 2020-11-26 MED ORDER — MIDAZOLAM HCL 2 MG/2ML IJ SOLN
INTRAMUSCULAR | Status: DC | PRN
  Administered 2020-11-26: 2 mg via INTRAVENOUS

## 2020-11-26 MED ORDER — NA CHONDROIT SULF-NA HYALURON 40-17 MG/ML IO SOLN
INTRAOCULAR | Status: DC | PRN
  Administered 2020-11-26: 1 mL via INTRAOCULAR

## 2020-11-26 MED ORDER — FENTANYL CITRATE (PF) 100 MCG/2ML IJ SOLN
INTRAMUSCULAR | Status: DC | PRN
  Administered 2020-11-26: 50 ug via INTRAVENOUS

## 2020-11-26 MED ORDER — MOXIFLOXACIN HCL 0.5 % OP SOLN
OPHTHALMIC | Status: DC | PRN
  Administered 2020-11-26: 0.2 mL via OPHTHALMIC

## 2020-11-26 MED ORDER — BRIMONIDINE TARTRATE-TIMOLOL 0.2-0.5 % OP SOLN
OPHTHALMIC | Status: DC | PRN
  Administered 2020-11-26: 1 [drp] via OPHTHALMIC

## 2020-11-26 MED ORDER — ARMC OPHTHALMIC DILATING DROPS
1.0000 "application " | OPHTHALMIC | Status: DC | PRN
  Administered 2020-11-26 (×3): 1 via OPHTHALMIC

## 2020-11-26 SURGICAL SUPPLY — 16 items
CANNULA ANT/CHMB 27GA (MISCELLANEOUS) ×4 IMPLANT
GLOVE BIOGEL M 8.0 STRL (GLOVE) ×2 IMPLANT
GLOVE SURG TRIUMPH 8.0 PF LTX (GLOVE) ×2 IMPLANT
GOWN STRL REUS W/ TWL LRG LVL3 (GOWN DISPOSABLE) ×2 IMPLANT
GOWN STRL REUS W/TWL LRG LVL3 (GOWN DISPOSABLE) ×4
LENS IOL TECNIS EYHANCE 19.0 (Intraocular Lens) ×2 IMPLANT
MARKER SKIN DUAL TIP RULER LAB (MISCELLANEOUS) ×2 IMPLANT
NEEDLE FILTER BLUNT 18X 1/2SAF (NEEDLE) ×1
NEEDLE FILTER BLUNT 18X1 1/2 (NEEDLE) ×1 IMPLANT
PACK EYE AFTER SURG (MISCELLANEOUS) ×2 IMPLANT
PACK OPTHALMIC (MISCELLANEOUS) ×2 IMPLANT
PACK PORFILIO (MISCELLANEOUS) ×2 IMPLANT
SYR 3ML LL SCALE MARK (SYRINGE) ×2 IMPLANT
SYR TB 1ML LUER SLIP (SYRINGE) ×2 IMPLANT
WATER STERILE IRR 250ML POUR (IV SOLUTION) ×2 IMPLANT
WIPE NON LINTING 3.25X3.25 (MISCELLANEOUS) ×2 IMPLANT

## 2020-11-26 NOTE — Anesthesia Procedure Notes (Signed)
Procedure Name: MAC Date/Time: 11/26/2020 11:47 AM Performed by: Silvana Newness, CRNA Pre-anesthesia Checklist: Patient identified, Emergency Drugs available, Suction available, Patient being monitored and Timeout performed Patient Re-evaluated:Patient Re-evaluated prior to induction Oxygen Delivery Method: Nasal cannula Placement Confirmation: positive ETCO2

## 2020-11-26 NOTE — Op Note (Signed)
PREOPERATIVE DIAGNOSIS:  Nuclear sclerotic cataract of the left eye.   POSTOPERATIVE DIAGNOSIS:  Nuclear sclerotic cataract of the left eye.   OPERATIVE PROCEDURE:@   SURGEON:  Birder Robson, MD.   ANESTHESIA:  Anesthesiologist: Rochel Brome, MD CRNA: Silvana Newness, CRNA  1.      Managed anesthesia care. 2.     0.70ml of Shugarcaine was instilled following the paracentesis   COMPLICATIONS:  None.   TECHNIQUE:   Stop and chop   DESCRIPTION OF PROCEDURE:  The patient was examined and consented in the preoperative holding area where the aforementioned topical anesthesia was applied to the left eye and then brought back to the Operating Room where the left eye was prepped and draped in the usual sterile ophthalmic fashion and a lid speculum was placed. A paracentesis was created with the side port blade and the anterior chamber was filled with viscoelastic. A near clear corneal incision was performed with the steel keratome. A continuous curvilinear capsulorrhexis was performed with a cystotome followed by the capsulorrhexis forceps. Hydrodissection and hydrodelineation were carried out with BSS on a blunt cannula. The lens was removed in a stop and chop  technique and the remaining cortical material was removed with the irrigation-aspiration handpiece. The capsular bag was inflated with viscoelastic and the Technis [id number] lens was placed in the capsular bag without complication. The remaining viscoelastic was removed from the eye with the irrigation-aspiration handpiece. The wounds were hydrated. The anterior chamber was flushed with BSS and the eye was inflated to physiologic pressure. 0.55ml Vigamox was placed in the anterior chamber. The wounds were found to be water tight. The eye was dressed with Combigan. The patient was given protective glasses to wear throughout the day and a shield with which to sleep tonight. The patient was also given drops with which to begin a drop regimen today and  will follow-up with me in one day. Implant Name Type Inv. Item Serial No. Manufacturer Lot No. LRB No. Used Action  LENS IOL TECNIS EYHANCE 19.0 - [id number] Intraocular Lens LENS IOL TECNIS EYHANCE 19.0 [id number] JOHNSON   Left 1 Implanted    Procedure(s) with comments: CATARACT EXTRACTION PHACO AND INTRAOCULAR LENS PLACEMENT (IOC) LEFT (Left) - 6.40 00:43.9  Electronically signed: Birder Robson 11/26/2020 12:01 PM

## 2020-11-26 NOTE — Anesthesia Preprocedure Evaluation (Signed)
Anesthesia Evaluation  Patient identified by MRN, date of birth, ID band Patient awake    Reviewed: Allergy & Precautions, H&P , NPO status , Patient's Chart, lab work & pertinent test results, reviewed documented beta blocker date and time   Airway Mallampati: II  TM Distance: >3 FB Neck ROM: full    Dental no notable dental hx.    Pulmonary former smoker,    Pulmonary exam normal breath sounds clear to auscultation       Cardiovascular Exercise Tolerance: Good hypertension,  Rhythm:regular Rate:Normal     Neuro/Psych PSYCHIATRIC DISORDERS Depression negative neurological ROS     GI/Hepatic Neg liver ROS, GERD  ,  Endo/Other  negative endocrine ROS  Renal/GU negative Renal ROS  negative genitourinary   Musculoskeletal   Abdominal   Peds  Hematology Hx of breast cancer   Anesthesia Other Findings   Reproductive/Obstetrics negative OB ROS                             Anesthesia Physical Anesthesia Plan  ASA: II  Anesthesia Plan: General   Post-op Pain Management:    Induction:   PONV Risk Score and Plan:   Airway Management Planned:   Additional Equipment:   Intra-op Plan:   Post-operative Plan:   Informed Consent: I have reviewed the patients History and Physical, chart, labs and discussed the procedure including the risks, benefits and alternatives for the proposed anesthesia with the patient or authorized representative who has indicated his/her understanding and acceptance.     Dental Advisory Given  Plan Discussed with: CRNA  Anesthesia Plan Comments:         Anesthesia Quick Evaluation

## 2020-11-26 NOTE — Transfer of Care (Signed)
Immediate Anesthesia Transfer of Care Note  Patient: Carrie Alvarez  Procedure(s) Performed: CATARACT EXTRACTION PHACO AND INTRAOCULAR LENS PLACEMENT (IOC) LEFT (Left Eye)  Patient Location: PACU  Anesthesia Type: No value filed.  Level of Consciousness: awake, alert  and patient cooperative  Airway and Oxygen Therapy: Patient Spontanous Breathing and Patient connected to supplemental oxygen  Post-op Assessment: Post-op Vital signs reviewed, Patient's Cardiovascular Status Stable, Respiratory Function Stable, Patent Airway and No signs of Nausea or vomiting  Post-op Vital Signs: Reviewed and stable  Complications: No complications documented.

## 2020-11-26 NOTE — Anesthesia Postprocedure Evaluation (Signed)
Anesthesia Post Note  Patient: Carrie Alvarez  Procedure(s) Performed: CATARACT EXTRACTION PHACO AND INTRAOCULAR LENS PLACEMENT (IOC) LEFT (Left Eye)     Patient location during evaluation: PACU Anesthesia Type: General Level of consciousness: awake and alert Pain management: pain level controlled Vital Signs Assessment: post-procedure vital signs reviewed and stable Respiratory status: spontaneous breathing, nonlabored ventilation, respiratory function stable and patient connected to nasal cannula oxygen Cardiovascular status: stable and blood pressure returned to baseline Postop Assessment: no apparent nausea or vomiting Anesthetic complications: no   No complications documented.  Trecia Rogers

## 2020-11-26 NOTE — H&P (Signed)
[facility]   Primary Care Physician:  Natale Milch Vickki Muff, PA-C Ophthalmologist: Dr. George Ina  Pre-Procedure History & Physical: HPI:  Carrie Alvarez is a 70 y.o. female here for cataract surgery.   Past Medical History:  Diagnosis Date  . Allergy   . Anemia   . Arthritis   . Back pain   . Breast cancer ([place]) 2015   LT LUMPECTOMY 12-2014 FOLLOWING CHEMO  . Carpal tunnel syndrome    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  . Cataract   . Chronic pain   . Colon cancer ([place]) 01/2019  . Depression   . Dyspnea   . Family history of breast cancer   . Family history of colon cancer   . GERD (gastroesophageal reflux disease)    problem with reflux during chemo  . History of methicillin resistant staphylococcus aureus (MRSA)   . Hypercholesteremia   . Hypertension   . Lumbosacral pain    sees pain management in [place]  . Obesity   . Personal history of chemotherapy 2016   left breast ca  . Personal history of chemotherapy 2020   colon ca  . Pinched nerve    left side, lumbar area  . Uterine cancer ([place]) 1994    Past Surgical History:  Procedure Laterality Date  . ABDOMINAL HYSTERECTOMY  1994   UTERINE CA  . AXILLARY LYMPH NODE BIOPSY Left 12/18/2014   Procedure: AXILLARY LYMPH NODE BIOPSY/;  Surgeon: Robert Bellow, MD;  Location: [facility];  Service: General;  Laterality: Left;  . AXILLARY LYMPH NODE DISSECTION Left 12/18/2014   Procedure: AXILLARY LYMPH NODE DISSECTION;  Surgeon: Robert Bellow, MD;  Location: [facility];  Service: General;  Laterality: Left;  . BREAST BIOPSY Left 05/2014   CORE BX OF LN, METASTATIC ADENOCARCINOMA  . BREAST LUMPECTOMY Left 12/2014   CHEMO FIRST THEN SURGERY OF LN  . BREAST SURGERY     lymph node removal  . CHOLECYSTECTOMY N/A 04/19/2016   Procedure: LAPAROSCOPIC CHOLECYSTECTOMY WITH INTRAOPERATIVE CHOLANGIOGRAM;  Surgeon: Hubbard Robinson, MD;  Location: [facility];  Service: General;  Laterality: N/A;  . COLON  RESECTION Right 01/10/2019   Procedure: HAND ASSISTED LAPAROSCOPIC RIGHT COLON RESECTION;  Surgeon: Jules Husbands, MD;  Location: [facility];  Service: General;  Laterality: Right;  . COLONOSCOPY WITH PROPOFOL N/A 12/30/2018   Procedure: COLONOSCOPY WITH PROPOFOL;  Surgeon: Lucilla Lame, MD;  Location: [place];  Service: Endoscopy;  Laterality: N/A;  . COLONOSCOPY WITH PROPOFOL N/A 03/05/2020   Procedure: COLONOSCOPY WITH PROPOFOL;  Surgeon: Lucilla Lame, MD;  Location: [facility] END[facility];  Service: Endoscopy;  Laterality: N/A;  . medial branch block  11/06/2015   lumbar facet Dr. Primus Bravo  . POLYPECTOMY N/A 12/30/2018   Procedure: POLYPECTOMY;  Surgeon: Lucilla Lame, MD;  Location: [place];  Service: Endoscopy;  Laterality: N/A;  Clips placed at Hepatic Flexure Polyp (2) and Transverse Colon Polyp (4) removal sites  . PORTACATH PLACEMENT Right 02/02/2019   Procedure: INSERTION PORT-A-CATH;  Surgeon: Jules Husbands, MD;  Location: [facility];  Service: General;  Laterality: Right;  . RADIOFREQUENCY ABLATION     lumbar  . SENTINEL NODE BIOPSY Left 12/18/2014   Procedure: SENTINEL NODE BIOPSY;  Surgeon: Robert Bellow, MD;  Location: [facility];  Service: General;  Laterality: Left;    Prior to Admission medications   Medication Sig Start Date End Date Taking? Authorizing Provider  acetaminophen (TYLENOL) 500 MG tablet Take 500 mg by  mouth every 6 (six) hours as needed for mild pain or moderate pain.    Yes [provider]  albuterol (VENTOLIN HFA) 108 (90 Base) MCG/ACT inhaler Inhale 2 puffs into the lungs every 6 (six) hours as needed for wheezing or shortness of breath. 08/06/20  Yes Terrilee Croak, Adriana M, PA-C  amLODipine (NORVASC) 5 MG tablet Take 1 tablet by mouth once daily 10/28/20  Yes Chrismon, Dennis E, PA-C  atorvastatin (LIPITOR) 10 MG tablet TAKE 1 TABLET BY MOUTH IN THE MORNING 04/09/20  Yes Pollak, Adriana M, PA-C  baclofen (LIORESAL) 10 MG tablet Take 1 tablet (10 mg  total) by mouth 2 (two) times daily. 10/28/17  Yes Trinna Post, PA-C  cetirizine (ZYRTEC) 10 MG tablet Take 10 mg by mouth daily at 6 (six) AM.   Yes [provider]  cholecalciferol (VITAMIN D) 1000 units tablet Take 1,000 Units by mouth daily.   Yes [provider]  fluticasone (FLONASE) 50 MCG/ACT nasal spray Use 2 spray(s) in each nostril once daily 10/24/20  Yes Chrismon, Vickki Muff, PA-C  gabapentin (NEURONTIN) 300 MG capsule Take 300-600 mg by mouth 4 (four) times daily as needed (neuropathic pain).    Yes [provider]  ondansetron (ZOFRAN) 4 MG tablet Take 1 tablet (4 mg total) by mouth every 8 (eight) hours as needed for nausea or vomiting. 10/30/20  Yes Just, Laurita Quint, FNP  Oxycodone HCl 10 MG TABS LIMIT ONE HALF TO ONE TABLET BY MOUTH 3 TO 5 TIMES DAILY IF TOLERATED 02/21/19  Yes [provider]  PREVIDENT 5000 DRY MOUTH 1.1 % GEL dental gel Place onto teeth at bedtime. 06/28/20  Yes [provider]  pyridoxine (B-6) 100 MG tablet Take 100 mg by mouth daily.   Yes [provider]  sertraline (ZOLOFT) 100 MG tablet Take 2 tablets by mouth once daily 10/24/20  Yes Chrismon, Vickki Muff, PA-C  vitamin B-12 (CYANOCOBALAMIN) 500 MCG tablet Take 500 mcg by mouth daily.   Yes [provider]  letrozole (FEMARA) 2.5 MG tablet Take 2.5 mg by mouth daily.  Patient not taking: Reported on 11/26/2020 04/07/19   [provider]  NARCAN 4 MG/0.1ML LIQD nasal spray kit Place 1 spray into the nose.  06/06/19   [provider]    Allergies as of 10/17/2020  . (No Known Allergies)    Family History  Problem Relation Age of Onset  . Breast cancer Sister 72  . Colon cancer Sister   . Colon cancer Mother        dx 41s  . Hypertension Mother   . Asthma Mother   . Cancer Father        voice box removed  . Cancer Maternal Grandmother        unsure type  . Colon cancer Cousin   . Cancer Cousin        unsure type     Social History   Socioeconomic History  . Marital status: Married    Spouse name: Vicente Serene  . Number of children: 3  . Years of education: Not on file  . Highest education level: Some college, no degree  Occupational History  . Occupation: retired    Comment: was on disability  Tobacco Use  . Smoking status: Former Smoker    Packs/day: 0.50    Types: Cigarettes    Quit date: 07/18/2014    Years since quitting: 6.3  . Smokeless tobacco: Never Used  Vaping Use  . Vaping  Use: Never used  Substance and Sexual Activity  . Alcohol use: No    Alcohol/week: 0.0 standard drinks  . Drug use: No  . Sexual activity: Not on file  Other Topics Concern  . Not on file  Social History Narrative   Husband and patient have not lived together for 10 years.  He is still helpful with patient.   Social Determinants of Health   Financial Resource Strain: Low Risk   . Difficulty of Paying Living Expenses: Not hard at all  Food Insecurity: No Food Insecurity  . Worried About Charity fundraiser in the Last Year: Never true  . Ran Out of Food in the Last Year: Never true  Transportation Needs: No Transportation Needs  . Lack of Transportation (Medical): No  . Lack of Transportation (Non-Medical): No  Physical Activity: Inactive  . Days of Exercise per Week: 0 days  . Minutes of Exercise per Session: 0 min  Stress: No Stress Concern Present  . Feeling of Stress : Not at all  Social Connections: Moderately Integrated  . Frequency of Communication with Friends and Family: Once a week  . Frequency of Social Gatherings with Friends and Family: More than three times a week  . Attends Religious Services: More than 4 times per year  . Active Member of Clubs or Organizations: No  . Attends Archivist Meetings: Never  . Marital Status: Married  Human resources officer Violence: Not At Risk  . Fear of Current or Ex-Partner: No  . Emotionally Abused: No  . Physically Abused: No  . Sexually  Abused: No    Review of Systems: See HPI, otherwise negative ROS  Physical Exam: BP 119/88   Pulse 72   Temp 97.6 F (36.4 C)   Resp 16   Ht 5' 3"  (1.6 m)   Wt 89.9 kg   SpO2 96%   BMI 35.11 kg/m  General:   Alert,  pleasant and cooperative in NAD Head:  Normocephalic and atraumatic. Respiratory:  Normal work of breathing. Cardiovascular:  RRR  Impression/Plan: Norm Salt is here for cataract surgery.  Risks, benefits, limitations, and alternatives regarding cataract surgery have been reviewed with the patient.  Questions have been answered.  All parties agreeable.   Birder Robson, MD  11/26/2020, 11:38 AM

## 2020-11-27 ENCOUNTER — Encounter: Payer: Self-pay | Admitting: Ophthalmology

## 2020-12-02 ENCOUNTER — Other Ambulatory Visit: Payer: Self-pay | Admitting: *Deleted

## 2020-12-02 DIAGNOSIS — H2511 Age-related nuclear cataract, right eye: Secondary | ICD-10-CM | POA: Diagnosis not present

## 2020-12-02 DIAGNOSIS — C184 Malignant neoplasm of transverse colon: Secondary | ICD-10-CM

## 2020-12-02 NOTE — Progress Notes (Signed)
cmp

## 2020-12-03 ENCOUNTER — Inpatient Hospital Stay: Payer: Medicare HMO | Attending: Oncology

## 2020-12-03 DIAGNOSIS — C184 Malignant neoplasm of transverse colon: Secondary | ICD-10-CM | POA: Diagnosis present

## 2020-12-03 LAB — CBC WITH DIFFERENTIAL/PLATELET
Abs Immature Granulocytes: 0.03 10*3/uL (ref 0.00–0.07)
Basophils Absolute: 0.1 10*3/uL (ref 0.0–0.1)
Basophils Relative: 1 %
Eosinophils Absolute: 0.2 10*3/uL (ref 0.0–0.5)
Eosinophils Relative: 2 %
HCT: 34.8 % — ABNORMAL LOW (ref 36.0–46.0)
Hemoglobin: 12.3 g/dL (ref 12.0–15.0)
Immature Granulocytes: 0 %
Lymphocytes Relative: 18 %
Lymphs Abs: 1.4 10*3/uL (ref 0.7–4.0)
MCH: 29.6 pg (ref 26.0–34.0)
MCHC: 35.3 g/dL (ref 30.0–36.0)
MCV: 83.7 fL (ref 80.0–100.0)
Monocytes Absolute: 0.5 10*3/uL (ref 0.1–1.0)
Monocytes Relative: 6 %
Neutro Abs: 5.5 10*3/uL (ref 1.7–7.7)
Neutrophils Relative %: 73 %
Platelets: 243 10*3/uL (ref 150–400)
RBC: 4.16 MIL/uL (ref 3.87–5.11)
RDW: 13.9 % (ref 11.5–15.5)
WBC: 7.6 10*3/uL (ref 4.0–10.5)
nRBC: 0 % (ref 0.0–0.2)

## 2020-12-03 LAB — COMPREHENSIVE METABOLIC PANEL
ALT: 15 U/L (ref 0–44)
AST: 19 U/L (ref 15–41)
Albumin: 3.8 g/dL (ref 3.5–5.0)
Alkaline Phosphatase: 73 U/L (ref 38–126)
Anion gap: 11 (ref 5–15)
BUN: 15 mg/dL (ref 8–23)
CO2: 25 mmol/L (ref 22–32)
Calcium: 9.7 mg/dL (ref 8.9–10.3)
Chloride: 106 mmol/L (ref 98–111)
Creatinine, Ser: 1.04 mg/dL — ABNORMAL HIGH (ref 0.44–1.00)
GFR, Estimated: 58 mL/min — ABNORMAL LOW (ref >60)
Glucose, Bld: 109 mg/dL — ABNORMAL HIGH (ref 70–99)
Potassium: 3.6 mmol/L (ref 3.5–5.1)
Sodium: 142 mmol/L (ref 135–145)
Total Bilirubin: 1 mg/dL (ref 0.3–1.2)
Total Protein: 7.2 g/dL (ref 6.5–8.1)

## 2020-12-04 LAB — CEA: CEA: 3.3 ng/mL (ref 0.0–4.7)

## 2020-12-05 DIAGNOSIS — M542 Cervicalgia: Secondary | ICD-10-CM | POA: Diagnosis not present

## 2020-12-05 DIAGNOSIS — M545 Low back pain, unspecified: Secondary | ICD-10-CM | POA: Diagnosis not present

## 2020-12-05 DIAGNOSIS — G894 Chronic pain syndrome: Secondary | ICD-10-CM | POA: Diagnosis not present

## 2020-12-05 NOTE — Discharge Instructions (Signed)

## 2020-12-10 ENCOUNTER — Encounter: Payer: Self-pay | Admitting: Ophthalmology

## 2020-12-10 ENCOUNTER — Ambulatory Visit: Payer: Medicare HMO | Admitting: Anesthesiology

## 2020-12-10 ENCOUNTER — Other Ambulatory Visit: Payer: Self-pay

## 2020-12-10 ENCOUNTER — Encounter
Admission: RE | Disposition: A | Discharge status: Home / Self Care | Payer: Self-pay | Source: Home / Self Care | Attending: Ophthalmology

## 2020-12-10 ENCOUNTER — Ambulatory Visit
Admission: RE | Admit: 2020-12-10 | Discharge: 2020-12-10 | Disposition: A | Discharge status: Home / Self Care | Payer: Medicare HMO | Attending: Ophthalmology | Admitting: Ophthalmology

## 2020-12-10 DIAGNOSIS — Z825 Family history of asthma and other chronic lower respiratory diseases: Secondary | ICD-10-CM | POA: Insufficient documentation

## 2020-12-10 DIAGNOSIS — I10 Essential (primary) hypertension: Secondary | ICD-10-CM | POA: Insufficient documentation

## 2020-12-10 DIAGNOSIS — Z87891 Personal history of nicotine dependence: Secondary | ICD-10-CM | POA: Insufficient documentation

## 2020-12-10 DIAGNOSIS — Z803 Family history of malignant neoplasm of breast: Secondary | ICD-10-CM | POA: Diagnosis not present

## 2020-12-10 DIAGNOSIS — Z8249 Family history of ischemic heart disease and other diseases of the circulatory system: Secondary | ICD-10-CM | POA: Insufficient documentation

## 2020-12-10 DIAGNOSIS — Z7951 Long term (current) use of inhaled steroids: Secondary | ICD-10-CM | POA: Diagnosis not present

## 2020-12-10 DIAGNOSIS — H2511 Age-related nuclear cataract, right eye: Secondary | ICD-10-CM | POA: Insufficient documentation

## 2020-12-10 DIAGNOSIS — Z800 Family history of malignant neoplasm of digestive organs: Secondary | ICD-10-CM | POA: Diagnosis not present

## 2020-12-10 DIAGNOSIS — H25811 Combined forms of age-related cataract, right eye: Secondary | ICD-10-CM | POA: Diagnosis not present

## 2020-12-10 HISTORY — PX: CATARACT EXTRACTION W/PHACO: SHX586

## 2020-12-10 SURGERY — PHACOEMULSIFICATION, CATARACT, WITH IOL INSERTION
Anesthesia: Monitor Anesthesia Care | Site: Eye | Laterality: Right

## 2020-12-10 MED ORDER — BRIMONIDINE TARTRATE-TIMOLOL 0.2-0.5 % OP SOLN
OPHTHALMIC | Status: DC | PRN
  Administered 2020-12-10: 1 [drp] via OPHTHALMIC

## 2020-12-10 MED ORDER — NA CHONDROIT SULF-NA HYALURON 40-17 MG/ML IO SOLN
INTRAOCULAR | Status: DC | PRN
  Administered 2020-12-10: 1 mL via INTRAOCULAR

## 2020-12-10 MED ORDER — LIDOCAINE HCL (PF) 2 % IJ SOLN
INTRAOCULAR | Status: DC | PRN
  Administered 2020-12-10: 1 mL

## 2020-12-10 MED ORDER — TETRACAINE HCL 0.5 % OP SOLN
1.0000 [drp] | OPHTHALMIC | Status: DC | PRN
  Administered 2020-12-10 (×3): 1 [drp] via OPHTHALMIC

## 2020-12-10 MED ORDER — MOXIFLOXACIN HCL 0.5 % OP SOLN
OPHTHALMIC | Status: DC | PRN
  Administered 2020-12-10: 0.2 mL via OPHTHALMIC

## 2020-12-10 MED ORDER — MIDAZOLAM HCL 2 MG/2ML IJ SOLN
INTRAMUSCULAR | Status: DC | PRN
  Administered 2020-12-10: 2 mg via INTRAVENOUS

## 2020-12-10 MED ORDER — ARMC OPHTHALMIC DILATING DROPS
1.0000 "application " | OPHTHALMIC | Status: DC | PRN
  Administered 2020-12-10 (×3): 1 via OPHTHALMIC

## 2020-12-10 MED ORDER — LACTATED RINGERS IV SOLN: INTRAVENOUS | Status: DC

## 2020-12-10 MED ORDER — EPINEPHRINE PF 1 MG/ML IJ SOLN
INTRAOCULAR | Status: DC | PRN
  Administered 2020-12-10: 59 mL via OPHTHALMIC

## 2020-12-10 SURGICAL SUPPLY — 19 items
CANNULA ANT/CHMB 27GA (MISCELLANEOUS) ×4 IMPLANT
GLOVE SURG LX 8.0 MICRO (GLOVE) ×1
GLOVE SURG LX STRL 8.0 MICRO (GLOVE) ×1 IMPLANT
GLOVE SURG TRIUMPH 8.0 PF LTX (GLOVE) ×6 IMPLANT
GOWN STRL REUS W/ TWL LRG LVL3 (GOWN DISPOSABLE) ×2 IMPLANT
GOWN STRL REUS W/TWL LRG LVL3 (GOWN DISPOSABLE) ×2
LENS IOL TECNIS EYHANCE 19.0 (Intraocular Lens) ×2 IMPLANT
MARKER SKIN DUAL TIP RULER LAB (MISCELLANEOUS) ×2 IMPLANT
NEEDLE FILTER BLUNT 18X 1/2SAF (NEEDLE) ×1
NEEDLE FILTER BLUNT 18X1 1/2 (NEEDLE) ×1 IMPLANT
PACK EYE AFTER SURG (MISCELLANEOUS) ×2 IMPLANT
PACK OPTHALMIC (MISCELLANEOUS) ×2 IMPLANT
PACK PORFILIO (MISCELLANEOUS) ×2 IMPLANT
SUT ETHILON 10-0 CS-B-6CS-B-6 (SUTURE)
SUTURE EHLN 10-0 CS-B-6CS-B-6 (SUTURE) IMPLANT
SYR 3ML LL SCALE MARK (SYRINGE) ×2 IMPLANT
SYR TB 1ML LUER SLIP (SYRINGE) ×2 IMPLANT
WATER STERILE IRR 250ML POUR (IV SOLUTION) ×2 IMPLANT
WIPE NON LINTING 3.25X3.25 (MISCELLANEOUS) ×2 IMPLANT

## 2020-12-10 NOTE — H&P (Signed)
[facility]   Primary Care Physician:  Natale Milch Vickki Muff, PA-C Ophthalmologist: Dr. George Ina  Pre-Procedure History & Physical: HPI:  Carrie Alvarez is a 70 y.o. female here for cataract surgery.   Past Medical History:  Diagnosis Date  . Allergy   . Anemia   . Arthritis   . Back pain   . Breast cancer ([place]) 2015   LT LUMPECTOMY 12-2014 FOLLOWING CHEMO  . Carpal tunnel syndrome    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  . Cataract   . Chronic pain   . Colon cancer ([place]) 01/2019  . Depression   . Dyspnea   . Family history of breast cancer   . Family history of colon cancer   . GERD (gastroesophageal reflux disease)    problem with reflux during chemo  . History of methicillin resistant staphylococcus aureus (MRSA)   . Hypercholesteremia   . Hypertension   . Lumbosacral pain    sees pain management in [place]  . Obesity   . Personal history of chemotherapy 2016   left breast ca  . Personal history of chemotherapy 2020   colon ca  . Pinched nerve    left side, lumbar area  . Uterine cancer ([place]) 1994    Past Surgical History:  Procedure Laterality Date  . ABDOMINAL HYSTERECTOMY  1994   UTERINE CA  . AXILLARY LYMPH NODE BIOPSY Left 12/18/2014   Procedure: AXILLARY LYMPH NODE BIOPSY/;  Surgeon: Robert Bellow, MD;  Location: [facility];  Service: General;  Laterality: Left;  . AXILLARY LYMPH NODE DISSECTION Left 12/18/2014   Procedure: AXILLARY LYMPH NODE DISSECTION;  Surgeon: Robert Bellow, MD;  Location: [facility];  Service: General;  Laterality: Left;  . BREAST BIOPSY Left 05/2014   CORE BX OF LN, METASTATIC ADENOCARCINOMA  . BREAST LUMPECTOMY Left 12/2014   CHEMO FIRST THEN SURGERY OF LN  . BREAST SURGERY     lymph node removal  . CATARACT EXTRACTION W/PHACO Left 11/26/2020   Procedure: CATARACT EXTRACTION PHACO AND INTRAOCULAR LENS PLACEMENT ([place]) LEFT;  Surgeon: Birder Robson, MD;  Location: [place];  Service: Ophthalmology;   Laterality: Left;  6.40 00:43.9  . CHOLECYSTECTOMY N/A 04/19/2016   Procedure: LAPAROSCOPIC CHOLECYSTECTOMY WITH INTRAOPERATIVE CHOLANGIOGRAM;  Surgeon: Hubbard Robinson, MD;  Location: [facility];  Service: General;  Laterality: N/A;  . COLON RESECTION Right 01/10/2019   Procedure: HAND ASSISTED LAPAROSCOPIC RIGHT COLON RESECTION;  Surgeon: Jules Husbands, MD;  Location: [facility];  Service: General;  Laterality: Right;  . COLONOSCOPY WITH PROPOFOL N/A 12/30/2018   Procedure: COLONOSCOPY WITH PROPOFOL;  Surgeon: Lucilla Lame, MD;  Location: [place];  Service: Endoscopy;  Laterality: N/A;  . COLONOSCOPY WITH PROPOFOL N/A 03/05/2020   Procedure: COLONOSCOPY WITH PROPOFOL;  Surgeon: Lucilla Lame, MD;  Location: [facility] END[facility]COPY;  Service: Endoscopy;  Laterality: N/A;  . medial branch block  11/06/2015   lumbar facet Dr. Primus Bravo  . POLYPECTOMY N/A 12/30/2018   Procedure: POLYPECTOMY;  Surgeon: Lucilla Lame, MD;  Location: [place];  Service: Endoscopy;  Laterality: N/A;  Clips placed at Hepatic Flexure Polyp (2) and Transverse Colon Polyp (4) removal sites  . PORTACATH PLACEMENT Right 02/02/2019   Procedure: INSERTION PORT-A-CATH;  Surgeon: Jules Husbands, MD;  Location: [facility];  Service: General;  Laterality: Right;  . RADIOFREQUENCY ABLATION     lumbar  . SENTINEL NODE BIOPSY Left 12/18/2014   Procedure: SENTINEL NODE BIOPSY;  Surgeon: Robert Bellow, MD;  Location: [facility];  Service: General;  Laterality: Left;    Prior to Admission medications   Medication Sig Start Date End Date Taking? Authorizing Provider  acetaminophen (TYLENOL) 500 MG tablet Take 500 mg by mouth every 6 (six) hours as needed for mild pain or moderate pain.    Yes [provider]  albuterol (VENTOLIN HFA) 108 (90 Base) MCG/ACT inhaler Inhale 2 puffs into the lungs every 6 (six) hours as needed for wheezing or shortness of breath. 08/06/20  Yes Terrilee Croak, Adriana M, PA-C  amLODipine (NORVASC) 5 MG  tablet Take 1 tablet by mouth once daily 10/28/20  Yes Chrismon, Dennis E, PA-C  atorvastatin (LIPITOR) 10 MG tablet TAKE 1 TABLET BY MOUTH IN THE MORNING 04/09/20  Yes Pollak, Adriana M, PA-C  baclofen (LIORESAL) 10 MG tablet Take 1 tablet (10 mg total) by mouth 2 (two) times daily. 10/28/17  Yes Trinna Post, PA-C  cetirizine (ZYRTEC) 10 MG tablet Take 10 mg by mouth daily at 6 (six) AM.   Yes [provider]  cholecalciferol (VITAMIN D) 1000 units tablet Take 1,000 Units by mouth daily.   Yes [provider]  fluticasone (FLONASE) 50 MCG/ACT nasal spray Use 2 spray(s) in each nostril once daily 10/24/20  Yes Chrismon, Vickki Muff, PA-C  gabapentin (NEURONTIN) 300 MG capsule Take 300-600 mg by mouth 4 (four) times daily as needed (neuropathic pain).    Yes [provider]  letrozole (FEMARA) 2.5 MG tablet Take 2.5 mg by mouth daily. 04/07/19  Yes [provider]  NARCAN 4 MG/0.1ML LIQD nasal spray kit Place 1 spray into the nose.  06/06/19  Yes [provider]  Oxycodone HCl 10 MG TABS LIMIT ONE HALF TO ONE TABLET BY MOUTH 3 TO 5 TIMES DAILY IF TOLERATED 02/21/19  Yes [provider]  PREVIDENT 5000 DRY MOUTH 1.1 % GEL dental gel Place onto teeth at bedtime. 06/28/20  Yes [provider]  pyridoxine (B-6) 100 MG tablet Take 100 mg by mouth daily.   Yes [provider]  sertraline (ZOLOFT) 100 MG tablet Take 2 tablets by mouth once daily 10/24/20  Yes Chrismon, Vickki Muff, PA-C  vitamin B-12 (CYANOCOBALAMIN) 500 MCG tablet Take 500 mcg by mouth daily.   Yes [provider]  ondansetron (ZOFRAN) 4 MG tablet Take 1 tablet (4 mg total) by mouth every 8 (eight) hours as needed for nausea or vomiting. 10/30/20   Just, Laurita Quint, FNP    Allergies as of 10/17/2020  . (No Known Allergies)    Family History  Problem Relation Age of Onset  . Breast cancer Sister 51  . Colon cancer Sister   . Colon cancer Mother        dx 34s  .  Hypertension Mother   . Asthma Mother   . Cancer Father        voice box removed  . Cancer Maternal Grandmother        unsure type  . Colon cancer Cousin   . Cancer Cousin        unsure type    Social History   Socioeconomic History  . Marital status: Married    Spouse name: Vicente Serene  . Number of children: 3  . Years of education: Not on file  . Highest education level: Some college, no degree  Occupational History  . Occupation: retired    Comment: was on disability  Tobacco Use  . Smoking status: Former Smoker    Packs/day: 0.50  Types: Cigarettes    Quit date: 07/18/2014    Years since quitting: 6.4  . Smokeless tobacco: Never Used  Vaping Use  . Vaping Use: Never used  Substance and Sexual Activity  . Alcohol use: No    Alcohol/week: 0.0 standard drinks  . Drug use: No  . Sexual activity: Not on file  Other Topics Concern  . Not on file  Social History Narrative   Husband and patient have not lived together for 10 years.  He is still helpful with patient.   Social Determinants of Health   Financial Resource Strain: Low Risk   . Difficulty of Paying Living Expenses: Not hard at all  Food Insecurity: No Food Insecurity  . Worried About Charity fundraiser in the Last Year: Never true  . Ran Out of Food in the Last Year: Never true  Transportation Needs: No Transportation Needs  . Lack of Transportation (Medical): No  . Lack of Transportation (Non-Medical): No  Physical Activity: Inactive  . Days of Exercise per Week: 0 days  . Minutes of Exercise per Session: 0 min  Stress: No Stress Concern Present  . Feeling of Stress : Not at all  Social Connections: Moderately Integrated  . Frequency of Communication with Friends and Family: Once a week  . Frequency of Social Gatherings with Friends and Family: More than three times a week  . Attends Religious Services: More than 4 times per year  . Active Member of Clubs or Organizations: No  . Attends Theatre manager Meetings: Never  . Marital Status: Married  Human resources officer Violence: Not At Risk  . Fear of Current or Ex-Partner: No  . Emotionally Abused: No  . Physically Abused: No  . Sexually Abused: No    Review of Systems: See HPI, otherwise negative ROS  Physical Exam: BP 108/82   Pulse 74   Temp (!) 97.5 F (36.4 C) (Temporal)   Resp 18   Ht _0  (1.6 m)   Wt 90.7 kg   SpO2 99%   BMI 35.43 kg/m  General:   Alert,  pleasant and cooperative in NAD Head:  Normocephalic and atraumatic. Respiratory:  Normal work of breathing. Cardiovascular:  RRR  Impression/Plan: Norm Salt is here for cataract surgery.  Risks, benefits, limitations, and alternatives regarding cataract surgery have been reviewed with the patient.  Questions have been answered.  All parties agreeable.   Birder Robson, MD  12/10/2020, 10:42 AM

## 2020-12-10 NOTE — Op Note (Signed)
PREOPERATIVE DIAGNOSIS:  Nuclear sclerotic cataract of the right eye.   POSTOPERATIVE DIAGNOSIS:  Cataract   OPERATIVE PROCEDURE:@   SURGEON:  Birder Robson, MD.   ANESTHESIA:  Anesthesiologist: Elgie Collard, MD CRNA: Nyoka Cowden, CRNA  1.      Managed anesthesia care. 2.      0.68ml of Shugarcaine was instilled in the eye following the paracentesis.   COMPLICATIONS:  None.   TECHNIQUE:   Stop and chop   DESCRIPTION OF PROCEDURE:  The patient was examined and consented in the preoperative holding area where the aforementioned topical anesthesia was applied to the right eye and then brought back to the Operating Room where the right eye was prepped and draped in the usual sterile ophthalmic fashion and a lid speculum was placed. A paracentesis was created with the side port blade and the anterior chamber was filled with viscoelastic. A near clear corneal incision was performed with the steel keratome. A continuous curvilinear capsulorrhexis was performed with a cystotome followed by the capsulorrhexis forceps. Hydrodissection and hydrodelineation were carried out with BSS on a blunt cannula. The lens was removed in a stop and chop  technique and the remaining cortical material was removed with the irrigation-aspiration handpiece. The capsular bag was inflated with viscoelastic and the Technis [id number]  lens was placed in the capsular bag without complication. The remaining viscoelastic was removed from the eye with the irrigation-aspiration handpiece. The wounds were hydrated. The anterior chamber was flushed with BSS and the eye was inflated to physiologic pressure. 0.89ml of Vigamox was placed in the anterior chamber. The wounds were found to be water tight. The eye was dressed with Combigan. The patient was given protective glasses to wear throughout the day and a shield with which to sleep tonight. The patient was also given drops with which to begin a drop regimen today and will  follow-up with me in one day. Implant Name Type Inv. Item Serial No. Manufacturer Lot No. LRB No. Used Action  LENS IOL TECNIS EYHANCE 19.0 - [id number] Intraocular Lens LENS IOL TECNIS EYHANCE 19.0 [id number] JOHNSON   Right 1 Implanted   Procedure(s) with comments: CATARACT EXTRACTION PHACO AND INTRAOCULAR LENS PLACEMENT ([facility]) RIGHT (Right) - 7.59 0:47.4  Electronically signed: Birder Robson 12/10/2020 11:09 AM

## 2020-12-10 NOTE — Anesthesia Preprocedure Evaluation (Addendum)
Anesthesia Evaluation  Patient identified by MRN, date of birth, ID band Patient awake    Reviewed: Allergy & Precautions, H&P , NPO status , Patient's Chart, lab work & pertinent test results  Airway Mallampati: II  TM Distance: >3 FB Neck ROM: full    Dental no notable dental hx.    Pulmonary former smoker,    Pulmonary exam normal        Cardiovascular hypertension, On Medications Normal cardiovascular exam Rhythm:regular Rate:Normal     Neuro/Psych PSYCHIATRIC DISORDERS Depression negative neurological ROS     GI/Hepatic Neg liver ROS, Medicated,  Endo/Other  negative endocrine ROS  Renal/GU negative Renal ROS  negative genitourinary   Musculoskeletal   Abdominal   Peds  Hematology   Anesthesia Other Findings   Reproductive/Obstetrics                            Anesthesia Physical Anesthesia Plan  ASA: II  Anesthesia Plan: MAC   Post-op Pain Management:    Induction:   PONV Risk Score and Plan: 2 and Treatment may vary due to age or medical condition  Airway Management Planned:   Additional Equipment:   Intra-op Plan:   Post-operative Plan:   Informed Consent: I have reviewed the patients History and Physical, chart, labs and discussed the procedure including the risks, benefits and alternatives for the proposed anesthesia with the patient or authorized representative who has indicated his/her understanding and acceptance.       Plan Discussed with:   Anesthesia Plan Comments:         Anesthesia Quick Evaluation

## 2020-12-10 NOTE — Transfer of Care (Signed)
Immediate Anesthesia Transfer of Care Note  Patient: Carrie Alvarez  Procedure(s) Performed: CATARACT EXTRACTION PHACO AND INTRAOCULAR LENS PLACEMENT ([facility]) RIGHT (Right Eye)  Patient Location: PACU  Anesthesia Type: MAC  Level of Consciousness: awake, alert  and patient cooperative  Airway and Oxygen Therapy: Patient Spontanous Breathing and Patient connected to supplemental oxygen  Post-op Assessment: Post-op Vital signs reviewed, Patient's Cardiovascular Status Stable, Respiratory Function Stable, Patent Airway and No signs of Nausea or vomiting  Post-op Vital Signs: Reviewed and stable  Complications: No complications documented.

## 2020-12-10 NOTE — Anesthesia Postprocedure Evaluation (Signed)
Anesthesia Post Note  Patient: Carrie Alvarez  Procedure(s) Performed: CATARACT EXTRACTION PHACO AND INTRAOCULAR LENS PLACEMENT ([facility]) RIGHT (Right Eye)     Patient location during evaluation: PACU Anesthesia Type: MAC Level of consciousness: awake and alert Pain management: pain level controlled Vital Signs Assessment: post-procedure vital signs reviewed and stable Respiratory status: spontaneous breathing Cardiovascular status: stable Anesthetic complications: no   No complications documented.  Gillian Scarce

## 2020-12-11 DIAGNOSIS — G894 Chronic pain syndrome: Secondary | ICD-10-CM | POA: Diagnosis not present

## 2020-12-12 ENCOUNTER — Encounter: Payer: Self-pay | Admitting: Oncology

## 2020-12-23 DIAGNOSIS — M545 Low back pain, unspecified: Secondary | ICD-10-CM | POA: Diagnosis not present

## 2020-12-23 DIAGNOSIS — M542 Cervicalgia: Secondary | ICD-10-CM | POA: Diagnosis not present

## 2020-12-23 DIAGNOSIS — G894 Chronic pain syndrome: Secondary | ICD-10-CM | POA: Diagnosis not present

## 2020-12-27 ENCOUNTER — Telehealth: Payer: Self-pay | Admitting: Family Medicine

## 2020-12-27 NOTE — Telephone Encounter (Signed)
[facility] faxed refill request for the following medications:   atorvastatin (LIPITOR) 10 MG tablet    Please advise.

## 2020-12-30 ENCOUNTER — Other Ambulatory Visit: Payer: Self-pay | Admitting: Family Medicine

## 2020-12-30 DIAGNOSIS — E7800 Pure hypercholesterolemia, unspecified: Secondary | ICD-10-CM

## 2020-12-30 MED ORDER — ATORVASTATIN CALCIUM 10 MG PO TABS: 1.0000 | ORAL_TABLET | Freq: Every morning | ORAL | 3 refills | Status: DC

## 2021-01-06 DIAGNOSIS — Z961 Presence of intraocular lens: Secondary | ICD-10-CM | POA: Diagnosis not present

## 2021-01-10 DIAGNOSIS — G894 Chronic pain syndrome: Secondary | ICD-10-CM | POA: Diagnosis not present

## 2021-01-10 DIAGNOSIS — M545 Low back pain, unspecified: Secondary | ICD-10-CM | POA: Diagnosis not present

## 2021-01-10 DIAGNOSIS — M542 Cervicalgia: Secondary | ICD-10-CM | POA: Diagnosis not present

## 2021-01-20 DIAGNOSIS — M545 Low back pain, unspecified: Secondary | ICD-10-CM | POA: Diagnosis not present

## 2021-01-20 DIAGNOSIS — M5136 Other intervertebral disc degeneration, lumbar region: Secondary | ICD-10-CM | POA: Diagnosis not present

## 2021-01-20 DIAGNOSIS — G894 Chronic pain syndrome: Secondary | ICD-10-CM | POA: Diagnosis not present

## 2021-01-20 DIAGNOSIS — M542 Cervicalgia: Secondary | ICD-10-CM | POA: Diagnosis not present

## 2021-01-22 ENCOUNTER — Other Ambulatory Visit: Payer: Self-pay | Admitting: Family Medicine

## 2021-01-22 DIAGNOSIS — I10 Essential (primary) hypertension: Secondary | ICD-10-CM

## 2021-01-31 ENCOUNTER — Ambulatory Visit: Payer: Medicare HMO | Admitting: Family Medicine

## 2021-02-10 DIAGNOSIS — M542 Cervicalgia: Secondary | ICD-10-CM | POA: Diagnosis not present

## 2021-02-10 DIAGNOSIS — M545 Low back pain, unspecified: Secondary | ICD-10-CM | POA: Diagnosis not present

## 2021-02-10 DIAGNOSIS — G894 Chronic pain syndrome: Secondary | ICD-10-CM | POA: Diagnosis not present

## 2021-02-11 ENCOUNTER — Encounter: Payer: Self-pay | Admitting: Family Medicine

## 2021-02-11 ENCOUNTER — Other Ambulatory Visit: Payer: Self-pay

## 2021-02-11 ENCOUNTER — Ambulatory Visit (INDEPENDENT_AMBULATORY_CARE_PROVIDER_SITE_OTHER): Payer: Medicare HMO | Admitting: Family Medicine

## 2021-02-11 VITALS — BP 117/87 | HR 65 | Temp 98.5°F | Wt 198.0 lb

## 2021-02-11 DIAGNOSIS — J309 Allergic rhinitis, unspecified: Secondary | ICD-10-CM | POA: Diagnosis not present

## 2021-02-11 DIAGNOSIS — E7800 Pure hypercholesterolemia, unspecified: Secondary | ICD-10-CM | POA: Diagnosis not present

## 2021-02-11 DIAGNOSIS — J40 Bronchitis, not specified as acute or chronic: Secondary | ICD-10-CM | POA: Diagnosis not present

## 2021-02-11 DIAGNOSIS — M5136 Other intervertebral disc degeneration, lumbar region: Secondary | ICD-10-CM

## 2021-02-11 DIAGNOSIS — I10 Essential (primary) hypertension: Secondary | ICD-10-CM | POA: Diagnosis not present

## 2021-02-11 DIAGNOSIS — M51369 Other intervertebral disc degeneration, lumbar region without mention of lumbar back pain or lower extremity pain: Secondary | ICD-10-CM

## 2021-02-11 MED ORDER — FLUTICASONE PROPIONATE 50 MCG/ACT NA SUSP: NASAL | 3 refills | Status: DC

## 2021-02-11 MED ORDER — ALBUTEROL SULFATE HFA 108 (90 BASE) MCG/ACT IN AERS: 2.0000 | INHALATION_SPRAY | Freq: Four times a day (QID) | RESPIRATORY_TRACT | 3 refills | Status: DC | PRN

## 2021-02-11 NOTE — Progress Notes (Signed)
Established patient visit   Patient: Carrie Alvarez   DOB: 1951-04-12   70 y.o. Female  MRN: [id number] Visit Date: 02/11/2021  Today's healthcare provider: Vernie Murders, PA-C   No chief complaint on file.  Subjective    HPI  Hypertension, follow-up  BP Readings from Last 3 Encounters:  02/11/21 117/87  12/10/20 (!) 138/92  11/26/20 95/71   Wt Readings from Last 3 Encounters:  02/11/21 198 lb (89.8 kg)  12/10/20 200 lb (90.7 kg)  11/26/20 198 lb 3.2 oz (89.9 kg)     She was last seen for hypertension 3 months ago.  BP at that visit was as above. Management since that visit includes none.  She reports good compliance with treatment. She is not having side effects.    Outside blood pressures are patient states she has been getting reading close to what we have today. Symptoms: No chest pain No chest pressure  No palpitations No syncope  No dyspnea No orthopnea  No paroxysmal nocturnal dyspnea No lower extremity edema   Pertinent labs: Lab Results  Component Value Date   CHOL 123 11/21/2020   HDL 61 11/21/2020   LDLCALC 51 11/21/2020   TRIG 48 11/21/2020   CHOLHDL 2.0 11/21/2020   Lab Results  Component Value Date   NA 142 12/03/2020   K 3.6 12/03/2020   CREATININE 1.04 (H) 12/03/2020   GFRNONAA 58 (L) 12/03/2020   GFRAA 57 (L) 02/26/2020   GLUCOSE 109 (H) 12/03/2020     The ASCVD Risk score Mikey Bussing DC Jr., et al., 2013) failed to calculate for the following reasons:   The valid total cholesterol range is 130 to 320 mg/dL   --------------------------------------------------------------------------------------------------- Past Medical History:  Diagnosis Date   Allergy    Anemia    Arthritis    Back pain    Breast cancer ([place]) 2015   LT LUMPECTOMY 12-2014 FOLLOWING CHEMO   Carpal tunnel syndrome    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   Cataract    Chronic pain    Colon cancer ([place]) 01/2019   Depression    Dyspnea    Family  history of breast cancer    Family history of colon cancer    GERD (gastroesophageal reflux disease)    problem with reflux during chemo   History of methicillin resistant staphylococcus aureus (MRSA)    Hypercholesteremia    Hypertension    Lumbosacral pain    sees pain management in [place]   Obesity    Personal history of chemotherapy 2016   left breast ca   Personal history of chemotherapy 2020   colon ca   Pinched nerve    left side, lumbar area   Uterine cancer ([place]) 1994   Past Surgical History:  Procedure Laterality Date   ABDOMINAL HYSTERECTOMY  1994   UTERINE CA   AXILLARY LYMPH NODE BIOPSY Left 12/18/2014   Procedure: AXILLARY LYMPH NODE BIOPSY/;  Surgeon: Robert Bellow, MD;  Location: [facility];  Service: General;  Laterality: Left;   AXILLARY LYMPH NODE DISSECTION Left 12/18/2014   Procedure: AXILLARY LYMPH NODE DISSECTION;  Surgeon: Robert Bellow, MD;  Location: [facility];  Service: General;  Laterality: Left;   BREAST BIOPSY Left 05/2014   CORE BX OF LN, METASTATIC ADENOCARCINOMA   BREAST LUMPECTOMY Left 12/2014   CHEMO FIRST THEN SURGERY OF LN   BREAST SURGERY     lymph node removal   CATARACT EXTRACTION W/PHACO Left  11/26/2020   Procedure: CATARACT EXTRACTION PHACO AND INTRAOCULAR LENS PLACEMENT ([place]) LEFT;  Surgeon: Birder Robson, MD;  Location: [place];  Service: Ophthalmology;  Laterality: Left;  6.40 00:43.9   CATARACT EXTRACTION W/PHACO Right 12/10/2020   Procedure: CATARACT EXTRACTION PHACO AND INTRAOCULAR LENS PLACEMENT ([facility]) RIGHT;  Surgeon: Birder Robson, MD;  Location: [place];  Service: Ophthalmology;  Laterality: Right;  7.59 0:47.4   CHOLECYSTECTOMY N/A 04/19/2016   Procedure: LAPAROSCOPIC CHOLECYSTECTOMY WITH INTRAOPERATIVE CHOLANGIOGRAM;  Surgeon: Hubbard Robinson, MD;  Location: [facility];  Service: General;  Laterality: N/A;   COLON RESECTION Right 01/10/2019   Procedure: HAND ASSISTED LAPAROSCOPIC RIGHT COLON  RESECTION;  Surgeon: Jules Husbands, MD;  Location: [facility];  Service: General;  Laterality: Right;   COLONOSCOPY WITH PROPOFOL N/A 12/30/2018   Procedure: COLONOSCOPY WITH PROPOFOL;  Surgeon: Lucilla Lame, MD;  Location: [place];  Service: Endoscopy;  Laterality: N/A;   COLONOSCOPY WITH PROPOFOL N/A 03/05/2020   Procedure: COLONOSCOPY WITH PROPOFOL;  Surgeon: Lucilla Lame, MD;  Location: [facility];  Service: Endoscopy;  Laterality: N/A;   medial branch block  11/06/2015   lumbar facet Dr. Primus Bravo   POLYPECTOMY N/A 12/30/2018   Procedure: POLYPECTOMY;  Surgeon: Lucilla Lame, MD;  Location: [place];  Service: Endoscopy;  Laterality: N/A;  Clips placed at Hepatic Flexure Polyp (2) and Transverse Colon Polyp (4) removal sites   PORTACATH PLACEMENT Right 02/02/2019   Procedure: INSERTION PORT-A-CATH;  Surgeon: Jules Husbands, MD;  Location: [facility];  Service: General;  Laterality: Right;   RADIOFREQUENCY ABLATION     lumbar   SENTINEL NODE BIOPSY Left 12/18/2014   Procedure: SENTINEL NODE BIOPSY;  Surgeon: Robert Bellow, MD;  Location: [facility];  Service: General;  Laterality: Left;   Family History  Problem Relation Age of Onset   Breast cancer Sister 24   Colon cancer Sister    Colon cancer Mother        dx 29s   Hypertension Mother    Asthma Mother    Cancer Father        voice box removed   Cancer Maternal Grandmother        unsure type   Colon cancer Cousin    Cancer Cousin        unsure type   Social History   Tobacco Use   Smoking status: Former    Packs/day: 0.50    Types: Cigarettes    Quit date: 07/18/2014    Years since quitting: 6.5   Smokeless tobacco: Never  Vaping Use   Vaping Use: Never used  Substance Use Topics   Alcohol use: No    Alcohol/week: 0.0 standard drinks   Drug use: No   No Known Allergies  Medications: Outpatient Medications Prior to Visit  Medication Sig   acetaminophen (TYLENOL) 500 MG tablet Take 500 mg by mouth  every 6 (six) hours as needed for mild pain or moderate pain.    albuterol (VENTOLIN HFA) 108 (90 Base) MCG/ACT inhaler Inhale 2 puffs into the lungs every 6 (six) hours as needed for wheezing or shortness of breath.   amLODipine (NORVASC) 5 MG tablet Take 1 tablet by mouth once daily   atorvastatin (LIPITOR) 10 MG tablet Take 1 tablet (10 mg total) by mouth every morning.   baclofen (LIORESAL) 10 MG tablet Take 1 tablet (10 mg total) by mouth 2 (two) times daily.   cetirizine (ZYRTEC) 10 MG tablet Take 10 mg by  mouth daily at 6 (six) AM.   cholecalciferol (VITAMIN D) 1000 units tablet Take 1,000 Units by mouth daily.   fluticasone (FLONASE) 50 MCG/ACT nasal spray Use 2 spray(s) in each nostril once daily   gabapentin (NEURONTIN) 300 MG capsule Take 300-600 mg by mouth 4 (four) times daily as needed (neuropathic pain).    letrozole (FEMARA) 2.5 MG tablet Take 2.5 mg by mouth daily.   NARCAN 4 MG/0.1ML LIQD nasal spray kit Place 1 spray into the nose.    Oxycodone HCl 10 MG TABS LIMIT ONE HALF TO ONE TABLET BY MOUTH 3 TO 5 TIMES DAILY IF TOLERATED   PREVIDENT 5000 DRY MOUTH 1.1 % GEL dental gel Place onto teeth at bedtime.   pyridoxine (B-6) 100 MG tablet Take 100 mg by mouth daily.   sertraline (ZOLOFT) 100 MG tablet Take 2 tablets by mouth once daily   vitamin B-12 (CYANOCOBALAMIN) 500 MCG tablet Take 500 mcg by mouth daily.   ondansetron (ZOFRAN) 4 MG tablet Take 1 tablet (4 mg total) by mouth every 8 (eight) hours as needed for nausea or vomiting.   Facility-Administered Medications Prior to Visit  Medication Dose Route Frequency Provider   heparin lock flush 100 unit/mL  500 Units Intravenous Once [name], MD   heparin lock flush 100 unit/mL  500 Units Intravenous Once [name], MD   sodium chloride flush (NS) 0.9 % injection 10 mL  10 mL Intravenous PRN [name], MD   Review of Systems  Constitutional: Negative.   HENT: Negative.    Respiratory:  Negative.    Cardiovascular: Negative.   Gastrointestinal: Negative.   Genitourinary: Negative.      Objective    BP 117/87 (BP Location: Right Arm, Patient Position: Sitting, Cuff Size: Large)   Pulse 65   Temp 98.5 F (36.9 C) (Oral)   Wt 198 lb (89.8 kg)   SpO2 99%   BMI 35.07 kg/m  Wt Readings from Last 3 Encounters:  02/11/21 198 lb (89.8 kg)  12/10/20 200 lb (90.7 kg)  11/26/20 198 lb 3.2 oz (89.9 kg)   Physical Exam Constitutional:      General: She is not in acute distress.    Appearance: She is well-developed.  HENT:     Head: Normocephalic and atraumatic.     Right Ear: Hearing normal.     Left Ear: Hearing normal.     Nose: Nose normal.  Eyes:     General: Lids are normal. No scleral icterus.       Right eye: No discharge.        Left eye: No discharge.     Conjunctiva/sclera: Conjunctivae normal.  Cardiovascular:     Rate and Rhythm: Normal rate and regular rhythm.     Heart sounds: Normal heart sounds.  Pulmonary:     Effort: Pulmonary effort is normal. No respiratory distress.     Breath sounds: Normal breath sounds.  Abdominal:     General: Bowel sounds are normal.     Palpations: Abdomen is soft.  Musculoskeletal:        General: Normal range of motion.     Cervical back: Neck supple.  Skin:    Findings: No lesion or rash.  Neurological:     Mental Status: She is alert and oriented to person, place, and time.     Sensory: Sensory deficit present.     Motor: Weakness present.     Comments: Weakness and numbness in the  left L5 distribution. DTR's diminished bilaterally throughout.  Psychiatric:        Speech: Speech normal.        Behavior: Behavior normal.        Thought Content: Thought content normal.      No results found for any visits on 02/11/21.  Assessment & Plan     1. Essential hypertension Good BP control on Amlodipine 5 mg qd. No chest pains, palpitations or significant peripheral edema. Recheck routine follow up labs. - CBC  with Differential/Platelet - Basic Metabolic Panel (BMET)  2. Hypercholesteremia Tolerating Lipitor 10 mg qd without significant side effects. Last lipid panel on 11-21-20 showed total cholesterol 123, HDL 61 and LDL 51. Continue present medication regimen and diet control.  3. Allergic rhinitis, unspecified seasonality, unspecified trigger Intermittent rhinorrhea and sneezing. May add Flonase back to the Zyrtec and recheck CBC for signs of infection. - fluticasone (FLONASE) 50 MCG/ACT nasal spray; Use 2 spray(s) in each nostril once daily  Dispense: 16 g; Refill: 3 - CBC with Differential/Platelet  4. Bronchitis Mild intermittent congestion. No significant wheeze, rale or rhonchus today. History being a smoker and stopped 07-18-14. History of breast and colon cancer with a pulmonary nodule. Scheduled for follow up with oncologist (Dr. Grayland Ormond) for CT scan of chest abdomen and pelvis 02-24-21. Will refill albuterol inhaler and recheck CBC with diff. - albuterol (VENTOLIN HFA) 108 (90 Base) MCG/ACT inhaler; Inhale 2 puffs into the lungs every 6 (six) hours as needed for wheezing or shortness of breath.  Dispense: 6.7 g; Refill: 3 - CBC with Differential/Platelet  5. DDD (degenerative disc disease), lumbar Chronic back pain with pain radiating down the left leg. Followed by Dr. Primus Bravo (pain management) with tolerable control.   No follow-ups on file.      I, Samanatha Brammer, PA-C, have reviewed all documentation for this visit. The documentation on 02/11/21 for the exam, diagnosis, procedures, and orders are all accurate and complete.    Vernie Murders, PA-C  [employer] [phone] (phone) [phone] (fax)  [place]

## 2021-02-12 ENCOUNTER — Encounter: Payer: Self-pay | Admitting: Oncology

## 2021-02-12 LAB — CBC WITH DIFFERENTIAL/PLATELET
Basophils Absolute: 0.1 10*3/uL (ref 0.0–0.2)
Basos: 1 %
EOS (ABSOLUTE): 0.3 10*3/uL (ref 0.0–0.4)
Eos: 3 %
Hematocrit: 36.3 % (ref 34.0–46.6)
Hemoglobin: 12.3 g/dL (ref 11.1–15.9)
Immature Grans (Abs): 0 10*3/uL (ref 0.0–0.1)
Immature Granulocytes: 0 %
Lymphocytes Absolute: 2.5 10*3/uL (ref 0.7–3.1)
Lymphs: 29 %
MCH: 28.1 pg (ref 26.6–33.0)
MCHC: 33.9 g/dL (ref 31.5–35.7)
MCV: 83 fL (ref 79–97)
Monocytes Absolute: 0.7 10*3/uL (ref 0.1–0.9)
Monocytes: 8 %
Neutrophils Absolute: 4.9 10*3/uL (ref 1.4–7.0)
Neutrophils: 59 %
Platelets: 238 10*3/uL (ref 150–450)
RBC: 4.37 x10E6/uL (ref 3.77–5.28)
RDW: 12.3 % (ref 11.7–15.4)
WBC: 8.4 10*3/uL (ref 3.4–10.8)

## 2021-02-12 LAB — BASIC METABOLIC PANEL
BUN/Creatinine Ratio: 13 (ref 12–28)
BUN: 15 mg/dL (ref 8–27)
CO2: 26 mmol/L (ref 20–29)
Calcium: 10 mg/dL (ref 8.7–10.3)
Chloride: 102 mmol/L (ref 96–106)
Creatinine, Ser: 1.16 mg/dL — ABNORMAL HIGH (ref 0.57–1.00)
Glucose: 76 mg/dL (ref 65–99)
Potassium: 4.9 mmol/L (ref 3.5–5.2)
Sodium: 143 mmol/L (ref 134–144)
eGFR: 51 mL/min/{1.73_m2} — ABNORMAL LOW (ref >59)

## 2021-02-17 ENCOUNTER — Encounter: Payer: Self-pay | Admitting: Oncology

## 2021-02-17 DIAGNOSIS — G894 Chronic pain syndrome: Secondary | ICD-10-CM | POA: Diagnosis not present

## 2021-02-17 DIAGNOSIS — M792 Neuralgia and neuritis, unspecified: Secondary | ICD-10-CM | POA: Diagnosis not present

## 2021-02-17 DIAGNOSIS — M545 Low back pain, unspecified: Secondary | ICD-10-CM | POA: Diagnosis not present

## 2021-02-17 DIAGNOSIS — M542 Cervicalgia: Secondary | ICD-10-CM | POA: Diagnosis not present

## 2021-02-17 DIAGNOSIS — M25559 Pain in unspecified hip: Secondary | ICD-10-CM | POA: Diagnosis not present

## 2021-02-17 DIAGNOSIS — M9973 Connective tissue and disc stenosis of intervertebral foramina of lumbar region: Secondary | ICD-10-CM | POA: Diagnosis not present

## 2021-02-17 DIAGNOSIS — G8929 Other chronic pain: Secondary | ICD-10-CM | POA: Diagnosis not present

## 2021-02-17 DIAGNOSIS — M47897 Other spondylosis, lumbosacral region: Secondary | ICD-10-CM | POA: Diagnosis not present

## 2021-02-17 DIAGNOSIS — M9943 Connective tissue stenosis of neural canal of lumbar region: Secondary | ICD-10-CM | POA: Diagnosis not present

## 2021-02-17 DIAGNOSIS — M4807 Spinal stenosis, lumbosacral region: Secondary | ICD-10-CM | POA: Diagnosis not present

## 2021-02-17 DIAGNOSIS — M4726 Other spondylosis with radiculopathy, lumbar region: Secondary | ICD-10-CM | POA: Diagnosis not present

## 2021-02-17 DIAGNOSIS — M48062 Spinal stenosis, lumbar region with neurogenic claudication: Secondary | ICD-10-CM | POA: Diagnosis not present

## 2021-02-17 DIAGNOSIS — M5136 Other intervertebral disc degeneration, lumbar region: Secondary | ICD-10-CM | POA: Diagnosis not present

## 2021-02-17 DIAGNOSIS — R519 Headache, unspecified: Secondary | ICD-10-CM | POA: Diagnosis not present

## 2021-02-24 ENCOUNTER — Ambulatory Visit: Payer: Medicare HMO | Attending: Oncology

## 2021-02-25 ENCOUNTER — Telehealth: Payer: Self-pay | Admitting: Oncology

## 2021-02-25 NOTE — Telephone Encounter (Signed)
Called patient x 2 to reschedule missed CT scan on 8/15 and MD visit on 8/25. Pt did not answer. Left VM to contact office to reschedule appts.

## 2021-02-26 ENCOUNTER — Other Ambulatory Visit: Payer: Self-pay

## 2021-02-26 DIAGNOSIS — C189 Malignant neoplasm of colon, unspecified: Secondary | ICD-10-CM

## 2021-02-28 NOTE — Progress Notes (Deleted)
[place]  [phone] [phone]) [phone]  ID: Carrie Alvarez OB: [birth year]  MR#: [id number]  RSW#:[id number]  Patient Care Team: [name], PA-C as PCP - General (Family Medicine) [name], MD as Consulting Physician (Oncology) [name], MD as Attending Physician (Pain Medicine) [name]., MD as Consulting Physician (Ophthalmology) [name], RN as Oncology Nurse Navigator [name], [name], MD as Referring Physician (Anesthesiology)  CHIEF COMPLAINT: Stage IIa ER+ left breast cancer with no breast lesion and axillary lymph node metastasis.  Now with stage IIIa colon cancer.  INTERVAL HISTORY: Patient returns to clinic today for routine 51-monthevaluation.  She is having significant back and leg pain today, but otherwise feels well.  She denies any weakness or fatigue.  She does not complain of peripheral neuropathy and has no neurologic complaints.  She denies any recent fevers or illnesses.  She has no chest pain, shortness of breath, cough, or hemoptysis.  She denies any nausea, vomiting, constipation, or diarrhea. She has no melena or hematochezia.  She has no urinary complaints.  Patient offers no further specific complaints today.  REVIEW OF SYSTEMS:   Review of Systems  Constitutional: Negative.  Negative for fever, malaise/fatigue and weight loss.  Respiratory: Negative.  Negative for cough and shortness of breath.   Cardiovascular: Negative.  Negative for chest pain and leg swelling.  Gastrointestinal: Negative.  Negative for abdominal pain, blood in stool, constipation, diarrhea, melena, nausea and vomiting.  Genitourinary: Negative.  Negative for dysuria.  Musculoskeletal:  Positive for back pain. Negative for neck pain.  Skin: Negative.  Negative for rash.  Neurological: Negative.  Negative for dizziness, tingling, sensory change, focal weakness, weakness and headaches.   Psychiatric/Behavioral: Negative.  Negative for depression. The patient is not nervous/anxious.    As per HPI. Otherwise, a complete review of systems is negative.  PAST MEDICAL HISTORY: Past Medical History:  Diagnosis Date   Allergy    Anemia    Arthritis    Back pain    [place]   LT LUMPECTOMY 12-2014 FOLLOWING CHEMO   Carpal tunnel syndrome    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   Cataract    Chronic pain    [place]   Depression    Dyspnea    Family history of breast cancer    Family history of colon cancer    GERD (gastroesophageal reflux disease)    problem with reflux during chemo   History of methicillin resistant staphylococcus aureus (MRSA)    Hypercholesteremia    Hypertension    Lumbosacral pain    sees pain management in [place]   Obesity    Personal history of chemotherapy 2016   left breast ca   Personal history of chemotherapy 2020   colon ca   Pinched nerve    left side, lumbar area   [place]    PAST SURGICAL HISTORY: Past Surgical History:  Procedure Laterality Date   ABDOMINAL HYSTERECTOMY  1994   UTERINE CA   AXILLARY LYMPH NODE BIOPSY Left 12/18/2014   Procedure: AXILLARY LYMPH NODE BIOPSY/;  Surgeon: JRobert Bellow MD;  Location: [facility];  Service: General;  Laterality: Left;   AXILLARY LYMPH NODE DISSECTION Left 12/18/2014   Procedure: AXILLARY LYMPH NODE DISSECTION;  Surgeon: JRobert Bellow MD;  Location: [facility];  Service: General;  Laterality: Left;   BREAST BIOPSY Left 05/2014   CORE BX OF  LN, METASTATIC ADENOCARCINOMA   BREAST LUMPECTOMY Left 12/2014   CHEMO FIRST THEN SURGERY OF LN   BREAST SURGERY     lymph node removal   CATARACT EXTRACTION W/PHACO Left 11/26/2020   Procedure: CATARACT EXTRACTION PHACO AND INTRAOCULAR LENS PLACEMENT ([place]) LEFT;  Surgeon: Birder Robson, MD;  Location: [place];  Service: Ophthalmology;  Laterality: Left;  6.40 00:43.9    CATARACT EXTRACTION W/PHACO Right 12/10/2020   Procedure: CATARACT EXTRACTION PHACO AND INTRAOCULAR LENS PLACEMENT (IOC) RIGHT;  Surgeon: Birder Robson, MD;  Location: [place];  Service: Ophthalmology;  Laterality: Right;  7.59 0:47.4   CHOLECYSTECTOMY N/A 04/19/2016   Procedure: LAPAROSCOPIC CHOLECYSTECTOMY WITH INTRAOPERATIVE CHOLANGIOGRAM;  Surgeon: Hubbard Robinson, MD;  Location: [facility];  Service: General;  Laterality: N/A;   COLON RESECTION Right 01/10/2019   Procedure: HAND ASSISTED LAPAROSCOPIC RIGHT COLON RESECTION;  Surgeon: Jules Husbands, MD;  Location: [facility];  Service: General;  Laterality: Right;   COLONOSCOPY WITH PROPOFOL N/A 12/30/2018   Procedure: COLONOSCOPY WITH PROPOFOL;  Surgeon: Lucilla Lame, MD;  Location: [place];  Service: Endoscopy;  Laterality: N/A;   COLONOSCOPY WITH PROPOFOL N/A 03/05/2020   Procedure: COLONOSCOPY WITH PROPOFOL;  Surgeon: Lucilla Lame, MD;  Location[facility];  Service: Endoscopy;  Laterality: N/A;   medial branch block  11/06/2015   lumbar facet Dr. Primus Bravo   POLYPECTOMY N/A 12/30/2018   Procedure: POLYPECTOMY;  Surgeon: Lucilla Lame, MD;  [facility];  Service: Endoscopy;  Laterality: N/A;  Clips placed at Hepatic Flexure Polyp (2) and Transverse Colon Polyp (4) removal sites   PORTACATH PLACEMENT Right 02/02/2019   Procedure: INSERTION PORT-A-CATH;  Surgeon: Jules Husbands, MD;  Location: [facility];  Service: General;  Laterality: Right;   RADIOFREQUENCY ABLATION     lumbar   SENTINEL NODE BIOPSY Left 12/18/2014   Procedure: SENTINEL NODE BIOPSY;  Surgeon: Robert Bellow, MD;  Location: [facility];  Service: General;  Laterality: Left;    FAMILY HISTORY Family History  Problem Relation Age of Onset   Breast cancer Sister 94   Colon cancer Sister    Colon cancer Mother        dx 42s   Hypertension Mother    Asthma Mother    Cancer Father        voice box removed   Cancer Maternal  Grandmother        unsure type   Colon cancer Cousin    Cancer Cousin        unsure type       ADVANCED DIRECTIVES:    HEALTH MAINTENANCE: Social History   Tobacco Use   Smoking status: Former    Packs/day: 0.50    Types: Cigarettes    Quit date: 07/18/2014    Years since quitting: 6.6   Smokeless tobacco: Never  Vaping Use   Vaping Use: Never used  Substance Use Topics   Alcohol use: No    Alcohol/week: 0.0 standard drinks   Drug use: No    No Known Allergies  Current Outpatient Medications  Medication Sig Dispense Refill   acetaminophen (TYLENOL) 500 MG tablet Take 500 mg by mouth every 6 (six) hours as needed for mild pain or moderate pain.      albuterol (VENTOLIN HFA) 108 (90 Base) MCG/ACT inhaler Inhale 2 puffs into the lungs every 6 (six) hours as needed for wheezing or shortness of breath. 6.7 g 3   amLODipine (NORVASC) 5 MG tablet Take  1 tablet by mouth once daily 90 tablet 0   atorvastatin (LIPITOR) 10 MG tablet Take 1 tablet (10 mg total) by mouth every morning. 90 tablet 3   baclofen (LIORESAL) 10 MG tablet Take 1 tablet (10 mg total) by mouth 2 (two) times daily. 30 each 0   cetirizine (ZYRTEC) 10 MG tablet Take 10 mg by mouth daily at 6 (six) AM.     cholecalciferol (VITAMIN D) 1000 units tablet Take 1,000 Units by mouth daily.     fluticasone (FLONASE) 50 MCG/ACT nasal spray Use 2 spray(s) in each nostril once daily 16 g 3   gabapentin (NEURONTIN) 300 MG capsule Take 300-600 mg by mouth 4 (four) times daily as needed (neuropathic pain).      letrozole (FEMARA) 2.5 MG tablet Take 2.5 mg by mouth daily.     NARCAN 4 MG/0.1ML LIQD nasal spray kit Place 1 spray into the nose.      ondansetron (ZOFRAN) 4 MG tablet Take 1 tablet (4 mg total) by mouth every 8 (eight) hours as needed for nausea or vomiting. 20 tablet 0   Oxycodone HCl 10 MG TABS LIMIT ONE HALF TO ONE TABLET BY MOUTH 3 TO 5 TIMES DAILY IF TOLERATED     PREVIDENT 5000 DRY MOUTH 1.1 % GEL dental gel  Place onto teeth at bedtime.     pyridoxine (B-6) 100 MG tablet Take 100 mg by mouth daily.     sertraline (ZOLOFT) 100 MG tablet Take 2 tablets by mouth once daily 180 tablet 0   vitamin B-12 (CYANOCOBALAMIN) 500 MCG tablet Take 500 mcg by mouth daily.     No current facility-administered medications for this visit.   Facility-Administered Medications Ordered in Other Visits  Medication Dose Route Frequency Provider Last Rate Last Admin   heparin lock flush 100 unit/mL  500 Units Intravenous Once [name], MD       heparin lock flush 100 unit/mL  500 Units Intravenous Once [name], MD       sodium chloride flush (NS) 0.9 % injection 10 mL  10 mL Intravenous PRN [name], MD   10 mL at 02/27/19 3754    OBJECTIVE: There were no vitals filed for this visit.    There is no height or weight on file to calculate BMI.    ECOG FS:1 - Symptomatic but completely ambulatory  General: Well-developed, well-nourished, no acute distress. Eyes: Pink conjunctiva, anicteric sclera. HEENT: Normocephalic, moist mucous membranes. Lungs: No audible wheezing or coughing. Heart: Regular rate and rhythm. Abdomen: Soft, nontender, no obvious distention. Musculoskeletal: No edema, cyanosis, or clubbing. Neuro: Alert, answering all questions appropriately. Cranial nerves grossly intact. Skin: No rashes or petechiae noted. Psych: Normal affect.   LAB RESULTS:  Lab Results  Component Value Date   NA 143 02/11/2021   K 4.9 02/11/2021   CL 102 02/11/2021   CO2 26 02/11/2021   GLUCOSE 76 02/11/2021   BUN 15 02/11/2021   CREATININE 1.16 (H) 02/11/2021   CALCIUM 10.0 02/11/2021   PROT 7.2 12/03/2020   ALBUMIN 3.8 12/03/2020   AST 19 12/03/2020   ALT 15 12/03/2020   ALKPHOS 73 12/03/2020   BILITOT 1.0 12/03/2020   GFRNONAA 58 (L) 12/03/2020   GFRAA 57 (L) 02/26/2020    Lab Results  Component Value Date   WBC 8.4 02/11/2021   NEUTROABS 4.9 02/11/2021   HGB 12.3  02/11/2021   HCT 36.3 02/11/2021   MCV 83 02/11/2021  PLT 238 02/11/2021     STUDIES: No results found.   ASSESSMENT: Stage IIa ER+ left breast cancer with no breast lesion and axillary lymph node metastasis. (TxN1M0)  HER-2 negative.  Now with stage IIIa colon cancer.  PLAN:   1.  Stage IIIa colon cancer: Pathology report reviewed independently.  Previously, patient agreed to enroll in clinical trial.  Oxaliplatin was discontinued early secondary to worsening neuropathy.  Patient completed 12 cycles of chemotherapy on July 31, 2019 and then completed maintenance Tecentriq on January 29, 2020.  Restaging CT scan results on September 05, 2020 reviewed independently and report as above with no obvious evidence of recurrent or metastatic disease.  Colonoscopy on March 05, 2020 which only revealed a 4 mm benign polyp.  Because she has Lynch syndrome, this should be repeated in 1 year.  Return to clinic in 3 months for laboratory work only and then in 6 months for laboratory work and further evaluation.  2. Stage IIa ER+ left breast cancer with no breast lesion and axillary lymph node metastasis: Despite no obvious breast lesion on mammogram or breast MRI, pathology and pattern of spread was consistent with primary breast cancer. CT and bone scan at time of diagnosis revealed no other evidence of malignancy.  She only received 3 cycles of Adriamycin and Cytoxan. Taxol was discontinued early as well secondary to persistent peripheral neuropathy. Patient completed her chemotherapy on Nov 19, 2014.  She elected not to pursue axillary node dissection given the potential morbidity of this procedure. It was also elected not to pursue adjuvant XRT given there was no primary breast lesion.  Patient completed approximately 4 years of treatment with letrozole, but this was discontinued secondary to enrolling in clinical trial for her newly diagnosed colon cancer.  Her most recent mammogram on July 04, 2019 was  reported as BI-RADS 1.  Repeat mammogram in December 2021.  Follow-up as above.  3.  Bone health: Patient's most recent bone mineral density on February 03, 2018 reported T score of -0.7 which is unchanged from 3 years prior.  Continue monitoring and evaluation per primary care.  4.  Lynch syndrome: Genetic testing confirmed patient has Lynch syndrome.  Likely maternal patient reports her mother had colon cancer at a very young age.  She reports her sister and son both have tested positive.  She reports her other children are not interested in genetic testing.  Appreciate genetics input.  Continue yearly colonoscopies as above.  5.  Endometrial cancer: Patient has had a total hysterectomy. 6.  Back pain/sciatica: Significantly worse today.  Continue monthly follow-up with pain clinic as scheduled.  Patient reports insurance will only allow 3 injections every 6 months.   7.  Peripheral neuropathy: Patient does not complain of this today.  Continue gabapentin 600 mg 4 times per day.  Continue follow-up with pain clinic as scheduled.  8.  Anemia: Resolved. 9.  Renal insufficiency: Slightly worse today, monitor. 10.  Hypokalemia: Resolved.  Continue oral potassium supplementation.  11.  Diarrhea: Resolved.  Continue Imodium and Lomotil as needed. 12.  Fatigue: Resolved. 13.  Hypotension: Resolved.  Continue Norvasc 5 mg daily. 14.  Port associated clot: Patient has now completed greater than 3 months of Eliquis and has discontinued treatment.  Port has been removed. 15: Left leg lesion: Patient was previously given a referral to dermatology.  Patient expressed understanding and was in agreement with this plan. She also understands that She can call clinic at any time with  any questions, concerns, or complaints.   Breast cancer metastasized to axillary lymph node   Staging form: Breast, AJCC 7th Edition     Clinical stage from 10/22/2014: Stage Unknown (TX, N1, M0) - Signed by [name], MD on  10/22/2014   [name], MD   02/28/2021 5:27 PM

## 2021-03-06 ENCOUNTER — Inpatient Hospital Stay: Payer: Medicare HMO | Admitting: Oncology

## 2021-03-06 DIAGNOSIS — C184 Malignant neoplasm of transverse colon: Secondary | ICD-10-CM

## 2021-03-13 DIAGNOSIS — G894 Chronic pain syndrome: Secondary | ICD-10-CM | POA: Diagnosis not present

## 2021-03-13 DIAGNOSIS — M545 Low back pain, unspecified: Secondary | ICD-10-CM | POA: Diagnosis not present

## 2021-03-13 DIAGNOSIS — M542 Cervicalgia: Secondary | ICD-10-CM | POA: Diagnosis not present

## 2021-03-18 DIAGNOSIS — G894 Chronic pain syndrome: Secondary | ICD-10-CM | POA: Diagnosis not present

## 2021-03-18 DIAGNOSIS — M545 Low back pain, unspecified: Secondary | ICD-10-CM | POA: Diagnosis not present

## 2021-03-18 DIAGNOSIS — M13862 Other specified arthritis, left knee: Secondary | ICD-10-CM | POA: Diagnosis not present

## 2021-03-18 DIAGNOSIS — M13861 Other specified arthritis, right knee: Secondary | ICD-10-CM | POA: Diagnosis not present

## 2021-03-19 ENCOUNTER — Encounter: Payer: Self-pay | Admitting: Emergency Medicine

## 2021-03-19 ENCOUNTER — Emergency Department: Payer: Medicare HMO

## 2021-03-19 ENCOUNTER — Encounter: Payer: Self-pay | Admitting: Oncology

## 2021-03-19 ENCOUNTER — Telehealth: Payer: Self-pay

## 2021-03-19 ENCOUNTER — Emergency Department
Admission: EM | Admit: 2021-03-19 | Discharge: 2021-03-19 | Disposition: A | Discharge status: Home / Self Care | Payer: Medicare HMO | Attending: Emergency Medicine | Admitting: Emergency Medicine

## 2021-03-19 ENCOUNTER — Other Ambulatory Visit: Payer: Self-pay

## 2021-03-19 DIAGNOSIS — I10 Essential (primary) hypertension: Secondary | ICD-10-CM | POA: Diagnosis not present

## 2021-03-19 DIAGNOSIS — W01198A Fall on same level from slipping, tripping and stumbling with subsequent striking against other object, initial encounter: Secondary | ICD-10-CM | POA: Diagnosis not present

## 2021-03-19 DIAGNOSIS — R0789 Other chest pain: Secondary | ICD-10-CM | POA: Diagnosis not present

## 2021-03-19 DIAGNOSIS — Z87891 Personal history of nicotine dependence: Secondary | ICD-10-CM | POA: Diagnosis not present

## 2021-03-19 DIAGNOSIS — Z79899 Other long term (current) drug therapy: Secondary | ICD-10-CM | POA: Insufficient documentation

## 2021-03-19 DIAGNOSIS — Z853 Personal history of malignant neoplasm of breast: Secondary | ICD-10-CM | POA: Insufficient documentation

## 2021-03-19 DIAGNOSIS — Z8541 Personal history of malignant neoplasm of cervix uteri: Secondary | ICD-10-CM | POA: Insufficient documentation

## 2021-03-19 DIAGNOSIS — Z85038 Personal history of other malignant neoplasm of large intestine: Secondary | ICD-10-CM | POA: Insufficient documentation

## 2021-03-19 DIAGNOSIS — R079 Chest pain, unspecified: Secondary | ICD-10-CM | POA: Diagnosis not present

## 2021-03-19 DIAGNOSIS — W19XXXA Unspecified fall, initial encounter: Secondary | ICD-10-CM

## 2021-03-19 LAB — BASIC METABOLIC PANEL
Anion gap: 6 (ref 5–15)
BUN: 17 mg/dL (ref 8–23)
CO2: 27 mmol/L (ref 22–32)
Calcium: 9.8 mg/dL (ref 8.9–10.3)
Chloride: 104 mmol/L (ref 98–111)
Creatinine, Ser: 1.11 mg/dL — ABNORMAL HIGH (ref 0.44–1.00)
GFR, Estimated: 53 mL/min — ABNORMAL LOW (ref >60)
Glucose, Bld: 87 mg/dL (ref 70–99)
Potassium: 4.1 mmol/L (ref 3.5–5.1)
Sodium: 137 mmol/L (ref 135–145)

## 2021-03-19 LAB — CBC
HCT: 33.6 % — ABNORMAL LOW (ref 36.0–46.0)
Hemoglobin: 12.1 g/dL (ref 12.0–15.0)
MCH: 29.7 pg (ref 26.0–34.0)
MCHC: 36 g/dL (ref 30.0–36.0)
MCV: 82.6 fL (ref 80.0–100.0)
Platelets: 205 10*3/uL (ref 150–400)
RBC: 4.07 MIL/uL (ref 3.87–5.11)
RDW: 14.4 % (ref 11.5–15.5)
WBC: 10.4 10*3/uL (ref 4.0–10.5)
nRBC: 0 % (ref 0.0–0.2)

## 2021-03-19 LAB — TROPONIN I (HIGH SENSITIVITY): Troponin I (High Sensitivity): 6 ng/L (ref <18)

## 2021-03-19 MED ORDER — HYDROCODONE-ACETAMINOPHEN 5-325 MG PO TABS
1.0000 | ORAL_TABLET | Freq: Once | ORAL | Status: AC
  Administered 2021-03-19: 1 via ORAL
  Filled 2021-03-19: qty 1

## 2021-03-19 MED ORDER — HYDROCODONE-ACETAMINOPHEN 5-325 MG PO TABS: 1.0000 | ORAL_TABLET | ORAL | 0 refills | Status: DC | PRN

## 2021-03-19 NOTE — Telephone Encounter (Signed)
Called patient back to schedule appt.  She said she fell 03/18/21 flat on her face.  She is having difficulty breathing, hurting in chest area and over right breast where she had breast cancer, in severe pain. I advised her to go to the ED asap to be evaluated for this.   She said she had already decided to go because she couldn't take the pain any longer.

## 2021-03-19 NOTE — Telephone Encounter (Signed)
Copied from [place] [id number]. Topic: Appointment Scheduling - Scheduling Inquiry for Clinic >> Mar 19, 2021 11:56 AM Pawlus, Brayton Layman A wrote: Reason for CRM: Pt called in wanting to schedule an appt, no open appts until October, pt wanted to know if they can be worked in sometime this week, please advise.

## 2021-03-19 NOTE — ED Provider Notes (Signed)
[facility] Emergency Department Provider Note   ____________________________________________   Event Date/Time   First MD Initiated Contact with Patient 03/19/21 1823     (approximate)  I have reviewed the triage vital signs and the nursing notes.   HISTORY  Chief Complaint Chest Pain and Fall    HPI Carrie Alvarez is a 70 y.o. female who presents for chest pain following a fall yesterday  LOCATION: Left chest DURATION: 1 day prior to arrival TIMING: Worsening since onset SEVERITY: 8/10 QUALITY: Aching pain CONTEXT: Patient states that she fell yesterday flat onto her chest and has been having left-sided anterior chest wall pain ever since MODIFYING FACTORS: Palpation and deep breaths worsen this pain and pressure relieved at rest ASSOCIATED SYMPTOMS: Shortness of breath   Per medical record review, patient has history of chronic pain and is currently seeing pain management          Past Medical History:  Diagnosis Date   Allergy    Anemia    Arthritis    Back pain    Breast cancer ([place]) 2015   LT LUMPECTOMY 12-2014 FOLLOWING CHEMO   Carpal tunnel syndrome    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   Cataract    Chronic pain    Colon cancer ([place]) 01/2019   Depression    Dyspnea    Family history of breast cancer    Family history of colon cancer    GERD (gastroesophageal reflux disease)    problem with reflux during chemo   History of methicillin resistant staphylococcus aureus (MRSA)    Hypercholesteremia    Hypertension    Lumbosacral pain    sees pain management in [place]   Obesity    Personal history of chemotherapy 2016   left breast ca   Personal history of chemotherapy 2020   colon ca   Pinched nerve    left side, lumbar area   Uterine cancer ([place]) 1994    Patient Active Problem List   Diagnosis Date Noted   Polyp of descending colon    MLH1-related Lynch syndrome (HNPCC2) 06/01/2019   Genetic  testing 06/01/2019   Family history of breast cancer    Family history of colon cancer    Goals of care, counseling/discussion 01/22/2019   Colon cancer ([place]) 01/10/2019   Personal history of colonic polyps    Polyp of transverse colon    Neoplasm of digestive system    Morbid obesity ([place]) 06/07/2018   Acute cholecystitis    Transaminitis    Cholangitis 04/18/2016   Allergic rhinitis 09/04/2015   AA (alopecia areata) 09/04/2015   Arthritis, degenerative 09/04/2015   Cancer of uterus ([place]) 09/04/2015   Avitaminosis D 09/04/2015   Benign positional vertigo 09/04/2015   Hypertension 03/15/2015   DDD (degenerative disc disease), lumbar 03/07/2015   Dizziness 12/25/2014   Lumbosacral facet joint syndrome 12/10/2014   Sacroiliac joint dysfunction 12/10/2014   DDD (degenerative disc disease), lumbosacral 11/13/2014   Depression 10/13/2014   Breast cancer metastasized to axillary lymph node ([place]) 06/21/2014   Hypercholesteremia 08/30/2009   Compulsive tobacco user syndrome 04/21/2008   Adaptation reaction 07/18/2007    Past Surgical History:  Procedure Laterality Date   ABDOMINAL HYSTERECTOMY  1994   UTERINE CA   AXILLARY LYMPH NODE BIOPSY Left 12/18/2014   Procedure: AXILLARY LYMPH NODE BIOPSY/;  Surgeon: Robert Bellow, MD;  Location: [facility];  Service: General;  Laterality: Left;   AXILLARY LYMPH NODE DISSECTION  Left 12/18/2014   Procedure: AXILLARY LYMPH NODE DISSECTION;  Surgeon: Robert Bellow, MD;  Location: [facility];  Service: General;  Laterality: Left;   BREAST BIOPSY Left 05/2014   CORE BX OF LN, METASTATIC ADENOCARCINOMA   BREAST LUMPECTOMY Left 12/2014   CHEMO FIRST THEN SURGERY OF LN   BREAST SURGERY     lymph node removal   CATARACT EXTRACTION W/PHACO Left 11/26/2020   Procedure: CATARACT EXTRACTION PHACO AND INTRAOCULAR LENS PLACEMENT ([place]) LEFT;  Surgeon: Birder Robson, MD;  Location: [place];  Service: Ophthalmology;  Laterality: Left;   6.40 00:43.9   CATARACT EXTRACTION W/PHACO Right 12/10/2020   Procedure: CATARACT EXTRACTION PHACO AND INTRAOCULAR LENS PLACEMENT ([facility]) RIGHT;  Surgeon: Birder Robson, MD;  Location: [place];  Service: Ophthalmology;  Laterality: Right;  7.59 0:47.4   CHOLECYSTECTOMY N/A 04/19/2016   Procedure: LAPAROSCOPIC CHOLECYSTECTOMY WITH INTRAOPERATIVE CHOLANGIOGRAM;  Surgeon: Hubbard Robinson, MD;  Location: [facility];  Service: General;  Laterality: N/A;   COLON RESECTION Right 01/10/2019   Procedure: HAND ASSISTED LAPAROSCOPIC RIGHT COLON RESECTION;  Surgeon: Jules Husbands, MD;  Location: [facility];  Service: General;  Laterality: Right;   COLONOSCOPY WITH PROPOFOL N/A 12/30/2018   Procedure: COLONOSCOPY WITH PROPOFOL;  Surgeon: Lucilla Lame, MD;  Location: [place];  Service: Endoscopy;  Laterality: N/A;   COLONOSCOPY WITH PROPOFOL N/A 03/05/2020   Procedure: COLONOSCOPY WITH PROPOFOL;  Surgeon: Lucilla Lame, MD;  Location: [facility] ENDOSCOPY;  Service: Endoscopy;  Laterality: N/A;   medial branch block  11/06/2015   lumbar facet Dr. Primus Bravo   POLYPECTOMY N/A 12/30/2018   Procedure: POLYPECTOMY;  Surgeon: Lucilla Lame, MD;  Location: [place];  Service: Endoscopy;  Laterality: N/A;  Clips placed at Hepatic Flexure Polyp (2) and Transverse Colon Polyp (4) removal sites   PORTACATH PLACEMENT Right 02/02/2019   Procedure: INSERTION PORT-A-CATH;  Surgeon: Jules Husbands, MD;  Location: [facility];  Service: General;  Laterality: Right;   RADIOFREQUENCY ABLATION     lumbar   SENTINEL NODE BIOPSY Left 12/18/2014   Procedure: SENTINEL NODE BIOPSY;  Surgeon: Robert Bellow, MD;  Location: [facility];  Service: General;  Laterality: Left;    Prior to Admission medications   Medication Sig Start Date End Date Taking? Authorizing Provider  HYDROcodone-acetaminophen (NORCO) 5-325 MG tablet Take 1 tablet by mouth every 4 (four) hours as needed for moderate pain. 03/19/21  Yes Naaman Plummer, MD  acetaminophen (TYLENOL) 500 MG tablet Take 500 mg by mouth every 6 (six) hours as needed for mild pain or moderate pain.     [provider]  albuterol (VENTOLIN HFA) 108 (90 Base) MCG/ACT inhaler Inhale 2 puffs into the lungs every 6 (six) hours as needed for wheezing or shortness of breath. 02/11/21   Chrismon, Vickki Muff, PA-C  amLODipine (NORVASC) 5 MG tablet Take 1 tablet by mouth once daily 01/22/21   Chrismon, Vickki Muff, PA-C  atorvastatin (LIPITOR) 10 MG tablet Take 1 tablet (10 mg total) by mouth every morning. 12/30/20   Chrismon, Vickki Muff, PA-C  baclofen (LIORESAL) 10 MG tablet Take 1 tablet (10 mg total) by mouth 2 (two) times daily. 10/28/17   Trinna Post, PA-C  cetirizine (ZYRTEC) 10 MG tablet Take 10 mg by mouth daily at 6 (six) AM.    [provider]  cholecalciferol (VITAMIN D) 1000 units tablet Take 1,000 Units by mouth daily.    [provider]  fluticasone (FLONASE) 50 MCG/ACT  nasal spray Use 2 spray(s) in each nostril once daily 02/11/21   Chrismon, Simona Huh E, PA-C  gabapentin (NEURONTIN) 300 MG capsule Take 300-600 mg by mouth 4 (four) times daily as needed (neuropathic pain).     [provider]  letrozole (FEMARA) 2.5 MG tablet Take 2.5 mg by mouth daily. 04/07/19   [provider]  NARCAN 4 MG/0.1ML LIQD nasal spray kit Place 1 spray into the nose.  06/06/19   [provider]  ondansetron (ZOFRAN) 4 MG tablet Take 1 tablet (4 mg total) by mouth every 8 (eight) hours as needed for nausea or vomiting. 10/30/20   Just, Laurita Quint, FNP  Oxycodone HCl 10 MG TABS LIMIT ONE HALF TO ONE TABLET BY MOUTH 3 TO 5 TIMES DAILY IF TOLERATED 02/21/19   [provider]  PREVIDENT 5000 DRY MOUTH 1.1 % GEL dental gel Place onto teeth at bedtime. 06/28/20   [provider]  pyridoxine (B-6) 100 MG tablet Take 100 mg by mouth daily.    [provider]  sertraline (ZOLOFT) 100 MG tablet Take 2 tablets by mouth once  daily 10/24/20   Chrismon, Vickki Muff, PA-C  vitamin B-12 (CYANOCOBALAMIN) 500 MCG tablet Take 500 mcg by mouth daily.    [provider]    Allergies Patient has no known allergies.  Family History  Problem Relation Age of Onset   Breast cancer Sister 31   Colon cancer Sister    Colon cancer Mother        dx 37s   Hypertension Mother    Asthma Mother    Cancer Father        voice box removed   Cancer Maternal Grandmother        unsure type   Colon cancer Cousin    Cancer Cousin        unsure type    Social History Social History   Tobacco Use   Smoking status: Former    Packs/day: 0.50    Types: Cigarettes    Quit date: 07/18/2014    Years since quitting: 6.6   Smokeless tobacco: Never  Vaping Use   Vaping Use: Never used  Substance Use Topics   Alcohol use: No    Alcohol/week: 0.0 standard drinks   Drug use: No    Review of Systems Constitutional: No fever/chills Eyes: No visual changes. ENT: No sore throat. Cardiovascular: Endorses left-sided chest pain. Respiratory: Denies shortness of breath. Gastrointestinal: No abdominal pain.  No nausea, no vomiting.  No diarrhea. Genitourinary: Negative for dysuria. Musculoskeletal: Negative for acute arthralgias Skin: Negative for rash. Neurological: Negative for headaches, weakness/numbness/paresthesias in any extremity Psychiatric: Negative for suicidal ideation/homicidal ideation   ____________________________________________   PHYSICAL EXAM:  VITAL SIGNS: ED Triage Vitals  Enc Vitals Group     BP 03/19/21 1637 (!) 146/98     Pulse Rate 03/19/21 1637 80     Resp 03/19/21 1637 20     Temp 03/19/21 1637 99.9 F (37.7 C)     Temp Source 03/19/21 1637 Oral     SpO2 03/19/21 1637 97 %     Weight 03/19/21 1640 198 lb (89.8 kg)     Height 03/19/21 1640 5' 3"  (1.6 m)     Head Circumference --      Peak Flow --      Pain Score 03/19/21 1640 10     Pain Loc --      Pain Edu? --      Excl.  in [place]? --     Constitutional: Alert and oriented. Well appearing and in no acute distress. Eyes: Conjunctivae are normal. PERRL. Head: Atraumatic. Nose: No congestion/rhinnorhea. Mouth/Throat: Mucous membranes are moist. Neck: No stridor Cardiovascular: Grossly normal heart sounds.  Good peripheral circulation. Respiratory: Normal respiratory effort.  No retractions. Gastrointestinal: Soft and nontender. No distention. Musculoskeletal: No obvious deformities.  Tenderness to palpation of the left chest wall Neurologic:  Normal speech and language. No gross focal neurologic deficits are appreciated. Skin:  Skin is warm and dry. No rash noted. Psychiatric: Mood and affect are normal. Speech and behavior are normal.  ____________________________________________   LABS (all labs ordered are listed, but only abnormal results are displayed)  Labs Reviewed  BASIC METABOLIC PANEL - Abnormal; Notable for the following components:      Result Value   Creatinine, Ser 1.11 (*)    GFR, Estimated 53 (*)    All other components within normal limits  CBC - Abnormal; Notable for the following components:   HCT 33.6 (*)    All other components within normal limits  TROPONIN I (HIGH SENSITIVITY)  TROPONIN I (HIGH SENSITIVITY)   ____________________________________________  EKG  ED ECG REPORT I, Naaman Plummer, the attending physician, personally viewed and interpreted this ECG.  Date: 03/19/2021 EKG Time: 1635 Rate: 79 Rhythm: normal sinus rhythm QRS Axis: normal Intervals: normal ST/T Wave abnormalities: normal Narrative Interpretation: no evidence of acute ischemia  ____________________________________________  RADIOLOGY  ED MD interpretation: 2 view chest x-ray shows no evidence of acute abnormalities including no pneumonia, pneumothorax, or widened mediastinum  Official radiology report(s): DG Chest 2 View  Result Date: 03/19/2021 CLINICAL DATA:  Chest pain after fall EXAM: CHEST - 2  VIEW COMPARISON:  October 21, 2020. FINDINGS: The heart size and mediastinal contours are within normal limits. Tortuous thoracic aorta. Chronic bronchitic lung changes with pulmonary hyperinflation. The visualized skeletal structures are unremarkable. IMPRESSION: No active cardiopulmonary disease. Electronically Signed   By: Dahlia Bailiff M.D.   On: 03/19/2021 17:44    ____________________________________________   PROCEDURES  Procedure(s) performed (including Critical Care):  .1-3 Lead EKG Interpretation  Date/Time: 03/19/2021 7:32 PM Performed by: Naaman Plummer, MD Authorized by: Naaman Plummer, MD     Interpretation: normal     ECG rate:  69   ECG rate assessment: normal     Rhythm: sinus rhythm     Ectopy: none     Conduction: normal     ____________________________________________   INITIAL IMPRESSION / ASSESSMENT AND PLAN / ED COURSE  As part of my medical decision making, I reviewed the following data within the electronic medical record, if available:  Nursing notes reviewed and incorporated, Labs reviewed, EKG interpreted, Old chart reviewed, Radiograph reviewed and Notes from prior ED visits reviewed and incorporated      Workup: ECG, CXR, CBC, BMP, Troponin Findings: ECG: No overt evidence of STEMI. No evidence of Brugadas sign, delta wave, epsilon wave, significantly prolonged QTc, or malignant arrhythmia HS Troponin: Negative x1 Other Labs unremarkable for emergent problems. CXR: Without PTX, PNA, or widened mediastinum Last Stress Test: Never Last Heart Catheterization: Never HEART Score: 2  Given History, Exam, and Workup I have low suspicion for ACS, Pneumothorax, Pneumonia, Pulmonary Embolus, Tamponade, Aortic Dissection or other emergent problem as a cause for this presentation.   Reassesment: Prior to discharge patients pain was controlled and they were well appearing.  Disposition:  Discharge. Strict return precautions discussed with patient with  full  understanding. Advised patient to follow up promptly with primary care provider      ____________________________________________   FINAL CLINICAL IMPRESSION(S) / ED DIAGNOSES  Final diagnoses:  Fall, initial encounter  Other chest pain     ED Discharge Orders          Ordered    HYDROcodone-acetaminophen (NORCO) 5-325 MG tablet  Every 4 hours PRN        03/19/21 1910             Note:  This document was prepared using Dragon voice recognition software and may include unintentional dictation errors.    Naaman Plummer, MD 03/19/21 [phone]

## 2021-03-19 NOTE — ED Triage Notes (Signed)
Pt reports that she was outside yesterday and fell face first hitting her chest on the ground. No LOC. She thought that she was ok yesterday, but her chest became sore last night where she could not sleep and got worse today. She reports that it hurts worse when she moves, takes a deep breath or coughs.

## 2021-03-20 ENCOUNTER — Other Ambulatory Visit: Payer: Self-pay

## 2021-03-20 ENCOUNTER — Ambulatory Visit
Admission: RE | Admit: 2021-03-20 | Discharge: 2021-03-20 | Disposition: A | Discharge status: Home / Self Care | Payer: Medicare HMO | Source: Ambulatory Visit | Attending: Oncology | Admitting: Oncology

## 2021-03-20 DIAGNOSIS — I700 Atherosclerosis of aorta: Secondary | ICD-10-CM | POA: Diagnosis not present

## 2021-03-20 DIAGNOSIS — I2510 Atherosclerotic heart disease of native coronary artery without angina pectoris: Secondary | ICD-10-CM | POA: Diagnosis not present

## 2021-03-20 DIAGNOSIS — C184 Malignant neoplasm of transverse colon: Secondary | ICD-10-CM | POA: Diagnosis not present

## 2021-03-20 DIAGNOSIS — K449 Diaphragmatic hernia without obstruction or gangrene: Secondary | ICD-10-CM | POA: Diagnosis not present

## 2021-03-20 DIAGNOSIS — J984 Other disorders of lung: Secondary | ICD-10-CM | POA: Diagnosis not present

## 2021-03-20 DIAGNOSIS — C189 Malignant neoplasm of colon, unspecified: Secondary | ICD-10-CM | POA: Diagnosis not present

## 2021-03-20 MED ORDER — IOHEXOL 350 MG/ML SOLN
100.0000 mL | Freq: Once | INTRAVENOUS | Status: AC | PRN
  Administered 2021-03-20: 100 mL via INTRAVENOUS

## 2021-03-21 ENCOUNTER — Ambulatory Visit: Payer: Self-pay | Admitting: *Deleted

## 2021-03-21 NOTE — Telephone Encounter (Signed)
Really should go to an UC or the ER to evaluate cough and shortness of breath. Need to be sure she is not having any pneumonia or need to follow up with her oncologist. Carrie Alvarez she did not get her CT scan to follow up with Carrie Alvarez.

## 2021-03-21 NOTE — Telephone Encounter (Signed)
Simona Huh, do you feel its appropriate for patient to wait until Monday to be seen? Or should she go back to the ER? She had a fall on Monday (4 days ago) and was evaluated in the ER. She now has cough and shortness of breath. Please advise. Thanks!

## 2021-03-21 NOTE — Telephone Encounter (Signed)
Advised patient as below.  

## 2021-03-21 NOTE — Telephone Encounter (Signed)
Chronic SOB with increased SOB for several days. Cough with  cloudy phlegm. Using inhaler every 4 hours instead of Q6 hours.Pain below left breast that began with a fall on Monday-was seen in ED but feels a tugging with movement. Advised to go to the ED but patient refused after being at the ED earlier this week for hours. Requested appointment for Monday. Routing to office for consideration.   Reason for Disposition  [1] MODERATE longstanding difficulty breathing (e.g., speaks in phrases, SOB even at rest, pulse 100-120) AND [2] SAME as normal  Answer Assessment - Initial Assessment Questions 1. RESPIRATORY STATUS: "Describe your breathing?" (e.g., wheezing, shortness of breath, unable to speak, severe coughing)  Shortness of breath, wheezing a few days, coughing 2. ONSET: "When did this breathing problem begin?"      One week 3. PATTERN "Does the difficult breathing come and go, or has it been constant since it started?"      constant 4. SEVERITY: "How bad is your breathing?" (e.g., mild, moderate, severe)    - MILD: No SOB at rest, mild SOB with walking, speaks normally in sentences, can lie down, no retractions, pulse < 100.    - MODERATE: SOB at rest, SOB with minimal exertion and prefers to sit, cannot lie down flat, speaks in phrases, mild retractions, audible wheezing, pulse 100-120.    - SEVERE: Very SOB at rest, speaks in single words, struggling to breathe, sitting hunched forward, retractions, pulse > 120      SOB  sitting 5. RECURRENT SYMPTOM: "Have you had difficulty breathing before?" If Yes, ask: "When was the last time?" and "What happened that time?"      Yes hx of bronchitis 6. CARDIAC HISTORY: "Do you have any history of heart disease?" (e.g., heart attack, angina, bypass surgery, angioplasty)      yes 7. LUNG HISTORY: "Do you have any history of lung disease?"  (e.g., pulmonary embolus, asthma, emphysema)     Chronic bronchitis 8. CAUSE: "What do you think is causing the  breathing problem?"      unknown 9. OTHER SYMPTOMS: "Do you have any other symptoms? (e.g., dizziness, runny nose, cough, chest pain, fever)     Cough for several days 10. O2 SATURATION MONITOR:  "Do you use an oxygen saturation monitor (pulse oximeter) at home?" If Yes, "What is your reading (oxygen level) today?" "What is your usual oxygen saturation reading?" (e.g., 95%)       no 11. PREGNANCY: "Is there any chance you are pregnant?" "When was your last menstrual period?"       na 12. TRAVEL: "Have you traveled out of the country in the last month?" (e.g., travel history, exposures)       no  Protocols used: Breathing Difficulty-A-AH

## 2021-03-22 NOTE — Progress Notes (Signed)
Carrie Alvarez  [phone] [phone]) [phone]  ID: Carrie Alvarez OB: 27-Feb-1951  MR#: [id number]  QPY#:[id number]  Patient Care Team: Tania Ade as PCP - General (Family Medicine) [name], MD as Consulting Physician (Oncology) [name], MD as Attending Physician (Pain Medicine) [name]., MD as Consulting Physician (Ophthalmology) [name], RN as Oncology Nurse Navigator [name], [name], MD as Referring Physician (Anesthesiology)  CHIEF COMPLAINT: Stage IIa ER+ left breast cancer with no breast lesion and axillary lymph node metastasis.  Now with stage IIIa colon cancer.  INTERVAL HISTORY: Patient returns to clinic today for routine 65-monthevaluation and discussion of her imaging results.  She had a fall approximately 1 week ago.  She did not sustain any injuries, but has significant soreness and flank tenderness since that time.  She otherwise feels well.  She does not complain of back or leg pain today.  Her peripheral neuropathy is chronic and unchanged.  She has no other neurologic complaints.  She denies any weakness or fatigue. She denies any recent fevers or illnesses.  She has no chest pain, shortness of breath, cough, or hemoptysis.  She denies any nausea, vomiting, constipation, or diarrhea. She has no melena or hematochezia.  She has no urinary complaints.  Patient offers no further specific complaints today.  REVIEW OF SYSTEMS:   Review of Systems  Constitutional: Negative.  Negative for fever, malaise/fatigue and weight loss.  Respiratory: Negative.  Negative for cough and shortness of breath.   Cardiovascular: Negative.  Negative for chest pain and leg swelling.  Gastrointestinal: Negative.  Negative for abdominal pain, blood in stool, constipation, diarrhea, melena, nausea and vomiting.  Genitourinary: Negative.  Negative for dysuria.  Musculoskeletal:  Positive for falls. Negative  for back pain and neck pain.  Skin: Negative.  Negative for rash.  Neurological:  Positive for tingling and sensory change. Negative for dizziness, focal weakness, weakness and headaches.  Psychiatric/Behavioral: Negative.  Negative for depression. The patient is not nervous/anxious.    As per HPI. Otherwise, a complete review of systems is negative.  PAST MEDICAL HISTORY: Past Medical History:  Diagnosis Date   Allergy    Anemia    Arthritis    Back pain    Breast cancer ([facility] 2015   LT LUMPECTOMY 12-2014 FOLLOWING CHEMO   Carpal tunnel syndrome    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   Cataract    Chronic pain    [place] cancer [place] 01[place]   Depression    Dyspnea    Family history of breast cancer    Family history of colon cancer    GERD (gastroesophageal reflux disease)    problem with reflux during chemo   History of methicillin resistant staphylococcus aureus (MRSA)    Hypercholesteremia    Hypertension    Lumbosacral pain    sees pain management in [place]   Obesity    Personal history of chemotherapy 2016   left breast ca   Personal history of chemotherapy 2020   colon ca   Pinched nerve    left side, lumbar area   [place]ter[place]    PAST SURGICAL HISTORY: Past Surgical History:  Procedure Laterality Date   ABDOMINAL HYSTERECTOMY  1994   UTERINE CA   AXILLARY LYMPH NODE BIOPSY Left 12/18/2014   Procedure: AXILLARY LYMPH NODE BIOPSY/;  Surgeon: JRobert Bellow MD;  Location: [facility];  Service: General;  Laterality: Left;   AXILLARY  LYMPH NODE DISSECTION Left 12/18/2014   Procedure: AXILLARY LYMPH NODE DISSECTION;  Surgeon: Robert Bellow, MD;  Location: [facility];  Service: General;  Laterality: Left;   BREAST BIOPSY Left 05/2014   CORE BX OF LN, METASTATIC ADENOCARCINOMA   BREAST LUMPECTOMY Left 12/2014   CHEMO FIRST THEN SURGERY OF LN   BREAST SURGERY     lymph node removal   CATARACT EXTRACTION W/PHACO Left 11/26/2020   Procedure:  CATARACT EXTRACTION PHACO AND INTRAOCULAR LENS PLACEMENT ([place]) LEFT;  Surgeon: Birder Robson, MD;  Location: [place];  Service: Ophthalmology;  Laterality: Left;  6.40 00:43.9   CATARACT EXTRACTION W/PHACO Right 12/10/2020   Procedure: CATARACT EXTRACTION PHACO AND INTRAOCULAR LENS PLACEMENT ([facility]) RIGHT;  Surgeon: Birder Robson, MD;  Location: [place];  Service: Ophthalmology;  Laterality: Right;  7.59 0:47.4   CHOLECYSTECTOMY N/A 04/19/2016   Procedure: LAPAROSCOPIC CHOLECYSTECTOMY WITH INTRAOPERATIVE CHOLANGIOGRAM;  Surgeon: Hubbard Robinson, MD;  Location: [facility];  Service: General;  Laterality: N/A;   COLON RESECTION Right 01/10/2019   Procedure: HAND ASSISTED LAPAROSCOPIC RIGHT COLON RESECTION;  Surgeon: Jules Husbands, MD;  Location: [facility];  Service: General;  Laterality: Right;   COLONOSCOPY WITH PROPOFOL N/A 12/30/2018   Procedure: COLONOSCOPY WITH PROPOFOL;  Surgeon: Lucilla Lame, MD;  Location: [place];  Service: Endoscopy;  Laterality: N/A;   COLONOSCOPY WITH PROPOFOL N/A 03/05/2020   Procedure: COLONOSCOPY WITH PROPOFOL;  Surgeon: Lucilla Lame, MD;  Location: [facility];  Service: Endoscopy;  Laterality: N/A;   medial branch block  11/06/2015   lumbar facet Dr. Primus Bravo   POLYPECTOMY N/A 12/30/2018   Procedure: POLYPECTOMY;  Surgeon: Lucilla Lame, MD;  Location: [place];  Service: Endoscopy;  Laterality: N/A;  Clips placed at Hepatic Flexure Polyp (2) and Transverse Colon Polyp (4) removal sites   PORTACATH PLACEMENT Right 02/02/2019   Procedure: INSERTION PORT-A-CATH;  Surgeon: Jules Husbands, MD;  Location: [facility];  Service: General;  Laterality: Right;   RADIOFREQUENCY ABLATION     lumbar   SENTINEL NODE BIOPSY Left 12/18/2014   Procedure: SENTINEL NODE BIOPSY;  Surgeon: Robert Bellow, MD;  Location: [facility];  Service: General;  Laterality: Left;    FAMILY HISTORY Family History  Problem Relation Age of Onset    Breast cancer Sister 55   Colon cancer Sister    Colon cancer Mother        dx 78s   Hypertension Mother    Asthma Mother    Cancer Father        voice box removed   Cancer Maternal Grandmother        unsure type   Colon cancer Cousin    Cancer Cousin        unsure type       ADVANCED DIRECTIVES:    HEALTH MAINTENANCE: Social History   Tobacco Use   Smoking status: Former    Packs/day: 0.50    Types: Cigarettes    Quit date: 07/18/2014    Years since quitting: 6.6   Smokeless tobacco: Never  Vaping Use   Vaping Use: Never used  Substance Use Topics   Alcohol use: No    Alcohol/week: 0.0 standard drinks   Drug use: No    No Active Allergies  Current Outpatient Medications  Medication Sig Dispense Refill   acetaminophen (TYLENOL) 500 MG tablet Take 500 mg by mouth every 6 (six) hours as needed for mild pain or moderate pain.  albuterol (VENTOLIN HFA) 108 (90 Base) MCG/ACT inhaler Inhale 2 puffs into the lungs every 6 (six) hours as needed for wheezing or shortness of breath. 6.7 g 3   amLODipine (NORVASC) 5 MG tablet Take 1 tablet by mouth once daily 90 tablet 0   atorvastatin (LIPITOR) 10 MG tablet Take 1 tablet (10 mg total) by mouth every morning. 90 tablet 3   baclofen (LIORESAL) 10 MG tablet Take 1 tablet (10 mg total) by mouth 2 (two) times daily. 30 each 0   cetirizine (ZYRTEC) 10 MG tablet Take 10 mg by mouth daily at 6 (six) AM.     cholecalciferol (VITAMIN D) 1000 units tablet Take 1,000 Units by mouth daily.     fluticasone (FLONASE) 50 MCG/ACT nasal spray Use 2 spray(s) in each nostril once daily 16 g 3   gabapentin (NEURONTIN) 300 MG capsule Take 300-600 mg by mouth 4 (four) times daily as needed (neuropathic pain).      HYDROcodone-acetaminophen (NORCO) 5-325 MG tablet Take 1 tablet by mouth every 4 (four) hours as needed for moderate pain. 12 tablet 0   ondansetron (ZOFRAN) 4 MG tablet Take 1 tablet (4 mg total) by mouth every 8 (eight) hours as  needed for nausea or vomiting. 20 tablet 0   Oxycodone HCl 10 MG TABS LIMIT ONE HALF TO ONE TABLET BY MOUTH 3 TO 5 TIMES DAILY IF TOLERATED     PREVIDENT 5000 DRY MOUTH 1.1 % GEL dental gel Place onto teeth at bedtime.     pyridoxine (B-6) 100 MG tablet Take 100 mg by mouth daily.     sertraline (ZOLOFT) 100 MG tablet Take 2 tablets by mouth once daily 180 tablet 0   letrozole (FEMARA) 2.5 MG tablet Take 2.5 mg by mouth daily.     NARCAN 4 MG/0.1ML LIQD nasal spray kit Place 1 spray into the nose.      vitamin B-12 (CYANOCOBALAMIN) 500 MCG tablet Take 500 mcg by mouth daily.     No current facility-administered medications for this visit.   Facility-Administered Medications Ordered in Other Visits  Medication Dose Route Frequency Provider Last Rate Last Admin   heparin lock flush 100 unit/mL  500 Units Intravenous Once [name], MD       heparin lock flush 100 unit/mL  500 Units Intravenous Once [name], MD       sodium chloride flush (NS) 0.9 % injection 10 mL  10 mL Intravenous PRN [name], MD   10 mL at 02/27/19 0925    OBJECTIVE: Vitals:   03/25/21 1139  BP: (!) 142/96  Pulse: 70  Resp: 16  Temp: (!) 97.2 F (36.2 C)  SpO2: 98%     Body mass index is 37.45 kg/m.    ECOG FS:1 - Symptomatic but completely ambulatory  General: Well-developed, well-nourished, no acute distress. Eyes: Pink conjunctiva, anicteric sclera. HEENT: Normocephalic, moist mucous membranes. Lungs: No audible wheezing or coughing. Heart: Regular rate and rhythm. Abdomen: Soft, nontender, no obvious distention. Musculoskeletal: No edema, cyanosis, or clubbing. Neuro: Alert, answering all questions appropriately. Cranial nerves grossly intact. Skin: No rashes or petechiae noted. Psych: Normal affect.   LAB RESULTS:  Lab Results  Component Value Date   NA 140 03/25/2021   K 3.9 03/25/2021   CL 104 03/25/2021   CO2 29 03/25/2021   GLUCOSE 91 03/25/2021   BUN 11  03/25/2021   CREATININE 1.06 (H) 03/25/2021   CALCIUM 9.0 03/25/2021   PROT 7.1  03/25/2021   ALBUMIN 3.5 03/25/2021   AST 22 03/25/2021   ALT 19 03/25/2021   ALKPHOS 81 03/25/2021   BILITOT 0.7 03/25/2021   GFRNONAA 57 (L) 03/25/2021   GFRAA 57 (L) 02/26/2020    Lab Results  Component Value Date   WBC 7.6 03/25/2021   NEUTROABS 4.9 03/25/2021   HGB 11.0 (L) 03/25/2021   HCT 31.9 (L) 03/25/2021   MCV 83.1 03/25/2021   PLT 219 03/25/2021     STUDIES: DG Chest 2 View  Result Date: 03/19/2021 CLINICAL DATA:  Chest pain after fall EXAM: CHEST - 2 VIEW COMPARISON:  October 21, 2020. FINDINGS: The heart size and mediastinal contours are within normal limits. Tortuous thoracic aorta. Chronic bronchitic lung changes with pulmonary hyperinflation. The visualized skeletal structures are unremarkable. IMPRESSION: No active cardiopulmonary disease. Electronically Signed   By: Dahlia Bailiff M.D.   On: 03/19/2021 17:44   CT CHEST ABDOMEN PELVIS W CONTRAST  Result Date: 03/21/2021 CLINICAL DATA:  Follow-up colon carcinoma.  Surveillance. EXAM: CT CHEST, ABDOMEN, AND PELVIS WITH CONTRAST TECHNIQUE: Multidetector CT imaging of the chest, abdomen and pelvis was performed following the standard protocol during bolus administration of intravenous contrast. CONTRAST:  183m OMNIPAQUE IOHEXOL 350 MG/ML SOLN COMPARISON:  09/04/2020 FINDINGS: CT CHEST FINDINGS Cardiovascular: No acute findings. Aortic atherosclerotic calcification noted. Mediastinum/Lymph Nodes: No masses or pathologically enlarged lymph nodes identified. Lungs/Pleura: No suspicious pulmonary nodules or masses identified. No evidence of infiltrate or pleural effusion. Mild scarring again noted in both lung bases. Musculoskeletal:  No suspicious bone lesions identified. CT ABDOMEN AND PELVIS FINDINGS Hepatobiliary: No masses identified. Prior cholecystectomy. No evidence of biliary obstruction. Pancreas:  No mass or inflammatory changes. Spleen:   Within normal limits in size and appearance. Adrenals/Urinary tract:  No masses or hydronephrosis. Stomach/Bowel: Small hiatal hernia noted. Stable postop changes from prior right hemicolectomy. No masses identified. No evidence of obstruction, inflammatory process, or abnormal fluid collections. Vascular/Lymphatic: No pathologically enlarged lymph nodes identified. No acute vascular findings. Aortic atherosclerotic calcification noted. Reproductive: Prior hysterectomy noted. Adnexal regions are unremarkable in appearance. Other:  None. Musculoskeletal:  No suspicious bone lesions identified. IMPRESSION: No acute findings. No evidence of recurrent or metastatic carcinoma within the chest, abdomen, or pelvis. Small hiatal hernia. Aortic Atherosclerosis (ICD10-I70.0). Electronically Signed   By: JMarlaine HindM.D.   On: 03/21/2021 13:01     ASSESSMENT: Stage IIa ER+ left breast cancer with no breast lesion and axillary lymph node metastasis. (TxN1M0)  HER-2 negative.  Now with stage IIIa colon cancer.  PLAN:   1.  Stage IIIa colon cancer: Pathology report reviewed independently.  Previously, patient agreed to enroll in clinical trial.  Oxaliplatin was discontinued early secondary to worsening neuropathy.  Patient completed 12 cycles of chemotherapy on July 31, 2019 and then completed maintenance Tecentriq on January 29, 2020.  Restaging CT scan from March 20, 2021 reviewed independently and reported as above with no obvious evidence of recurrent or progressive disease.  CEA from today is pending.  Colonoscopy on March 05, 2020 which only revealed a 4 mm benign polyp.  Because she has Lynch syndrome, this should be repeated in 1 year.  A referral has been sent back to GI to repeat colonoscopy.  Return to clinic in 6 months with repeat laboratory work and further evaluation.  2. Stage IIa ER+ left breast cancer with no breast lesion and axillary lymph node metastasis: Despite no obvious breast lesion on  mammogram or breast MRI, pathology and  pattern of spread was consistent with primary breast cancer. CT and bone scan at time of diagnosis revealed no other evidence of malignancy.  She only received 3 cycles of Adriamycin and Cytoxan. Taxol was discontinued early as well secondary to persistent peripheral neuropathy. Patient completed her chemotherapy on Nov 19, 2014.  She elected not to pursue axillary node dissection given the potential morbidity of this procedure. It was also elected not to pursue adjuvant XRT given there was no primary breast lesion.  Patient completed approximately 4 years of treatment with letrozole, but this was discontinued secondary to enrolling in clinical trial for her newly diagnosed colon cancer.  Her most recent mammogram on July 04, 2020 was reported as BI-RADS 1.  Repeat in December 2022.    3.  Bone health: Patient's most recent bone mineral density on February 03, 2018 reported T score of -0.7 which is unchanged from 3 years prior.  Continue monitoring and evaluation per primary care.  4.  Lynch syndrome: Genetic testing confirmed patient has Lynch syndrome.  Likely maternal patient reports her mother had colon cancer at a very young age.  She reports her sister and son both have tested positive.  She reports her other children are not interested in genetic testing.  Appreciate genetics input.  Continue yearly colonoscopies as above.  5.  Endometrial cancer: Patient has had a total hysterectomy. 6.  Back pain/sciatica: Patient does not complain of this today.  Continue monthly follow-up with pain clinic as scheduled.  Patient reports insurance will only allow 3 injections every 6 months.   7.  Peripheral neuropathy: Patient does not complain of this today.  Continue gabapentin 600 mg 4 times per day.  Continue follow-up with pain clinic as scheduled.  She declined referral to neuro oncology. 8.  Anemia: Resolved. 9.  Renal insufficiency: Nearly resolved.   Patient  expressed understanding and was in agreement with this plan. She also understands that She can call clinic at any time with any questions, concerns, or complaints.   Breast cancer metastasized to axillary lymph node   Staging form: Breast, AJCC 7th Edition     Clinical stage from 10/22/2014: Stage Unknown (TX, N1, M0) - Signed by [name], MD on 10/22/2014   [name], MD   03/25/2021 2:14 PM

## 2021-03-25 ENCOUNTER — Inpatient Hospital Stay: Payer: Medicare HMO | Attending: Oncology | Admitting: Oncology

## 2021-03-25 ENCOUNTER — Encounter: Payer: Self-pay | Admitting: Medical Oncology

## 2021-03-25 ENCOUNTER — Other Ambulatory Visit: Payer: Self-pay

## 2021-03-25 ENCOUNTER — Inpatient Hospital Stay: Payer: Medicare HMO

## 2021-03-25 VITALS — BP 142/96 | HR 70 | Temp 97.2°F | Resp 16 | Wt 211.4 lb

## 2021-03-25 DIAGNOSIS — Z006 Encounter for examination for normal comparison and control in clinical research program: Secondary | ICD-10-CM | POA: Diagnosis not present

## 2021-03-25 DIAGNOSIS — C184 Malignant neoplasm of transverse colon: Secondary | ICD-10-CM

## 2021-03-25 DIAGNOSIS — C189 Malignant neoplasm of colon, unspecified: Secondary | ICD-10-CM | POA: Diagnosis present

## 2021-03-25 DIAGNOSIS — Z8542 Personal history of malignant neoplasm of other parts of uterus: Secondary | ICD-10-CM | POA: Insufficient documentation

## 2021-03-25 DIAGNOSIS — Z79899 Other long term (current) drug therapy: Secondary | ICD-10-CM | POA: Insufficient documentation

## 2021-03-25 DIAGNOSIS — Z1509 Genetic susceptibility to other malignant neoplasm: Secondary | ICD-10-CM | POA: Insufficient documentation

## 2021-03-25 DIAGNOSIS — Z9221 Personal history of antineoplastic chemotherapy: Secondary | ICD-10-CM | POA: Insufficient documentation

## 2021-03-25 DIAGNOSIS — Z853 Personal history of malignant neoplasm of breast: Secondary | ICD-10-CM | POA: Insufficient documentation

## 2021-03-25 DIAGNOSIS — T451X5A Adverse effect of antineoplastic and immunosuppressive drugs, initial encounter: Secondary | ICD-10-CM | POA: Insufficient documentation

## 2021-03-25 DIAGNOSIS — G620 Drug-induced polyneuropathy: Secondary | ICD-10-CM | POA: Insufficient documentation

## 2021-03-25 LAB — CBC WITH DIFFERENTIAL/PLATELET
Abs Immature Granulocytes: 0.06 10*3/uL (ref 0.00–0.07)
Basophils Absolute: 0.1 10*3/uL (ref 0.0–0.1)
Basophils Relative: 1 %
Eosinophils Absolute: 0.4 10*3/uL (ref 0.0–0.5)
Eosinophils Relative: 5 %
HCT: 31.9 % — ABNORMAL LOW (ref 36.0–46.0)
Hemoglobin: 11 g/dL — ABNORMAL LOW (ref 12.0–15.0)
Immature Granulocytes: 1 %
Lymphocytes Relative: 20 %
Lymphs Abs: 1.5 10*3/uL (ref 0.7–4.0)
MCH: 28.6 pg (ref 26.0–34.0)
MCHC: 34.5 g/dL (ref 30.0–36.0)
MCV: 83.1 fL (ref 80.0–100.0)
Monocytes Absolute: 0.6 10*3/uL (ref 0.1–1.0)
Monocytes Relative: 8 %
Neutro Abs: 4.9 10*3/uL (ref 1.7–7.7)
Neutrophils Relative %: 65 %
Platelets: 219 10*3/uL (ref 150–400)
RBC: 3.84 MIL/uL — ABNORMAL LOW (ref 3.87–5.11)
RDW: 14.6 % (ref 11.5–15.5)
WBC: 7.6 10*3/uL (ref 4.0–10.5)
nRBC: 0 % (ref 0.0–0.2)

## 2021-03-25 LAB — COMPREHENSIVE METABOLIC PANEL
ALT: 19 U/L (ref 0–44)
AST: 22 U/L (ref 15–41)
Albumin: 3.5 g/dL (ref 3.5–5.0)
Alkaline Phosphatase: 81 U/L (ref 38–126)
Anion gap: 7 (ref 5–15)
BUN: 11 mg/dL (ref 8–23)
CO2: 29 mmol/L (ref 22–32)
Calcium: 9 mg/dL (ref 8.9–10.3)
Chloride: 104 mmol/L (ref 98–111)
Creatinine, Ser: 1.06 mg/dL — ABNORMAL HIGH (ref 0.44–1.00)
GFR, Estimated: 57 mL/min — ABNORMAL LOW (ref >60)
Glucose, Bld: 91 mg/dL (ref 70–99)
Potassium: 3.9 mmol/L (ref 3.5–5.1)
Sodium: 140 mmol/L (ref 135–145)
Total Bilirubin: 0.7 mg/dL (ref 0.3–1.2)
Total Protein: 7.1 g/dL (ref 6.5–8.1)

## 2021-03-25 LAB — CEA: CEA: 4.7 ng/mL (ref 0.0–4.7)

## 2021-03-25 NOTE — Research (Addendum)
RANDOMIZED TRIAL OF STANDARD CHEMOTHERAPY ALONE OR COMBINED WITH ATEZOLIZUMAB AS ADJUVANT THERAPY FOR PATIENTS WITH STAGE III COLON CANCER AND DEFICIENT DNA MISMATCH REPAIR   Patient in clinic this morning for her 24 month study follow up visit on the [id number] [name] study.  I met with patient, here alone, in exam room and prior to her visit with Dr. Grayland Ormond. I introduced myself and informed her that I will be completing her research visit today. Patient gave her verbal understanding. Patient informed me that she fell last week, while she was in her garden. She states she tripped over something and fell on her left side. She had an ED visit related to this fall on 03/20/21. Patient with no broken bones, but confirms to be sore and in pain. See other AE's listed below.   Labs: CEA collected and result is pending. H&P:  Patient met with Dr. Grayland Ormond who completed patient visit and answered patient's questions. Patient's v/s and weight collected  (see flow sheet).  AE's: Patient states she has ongoing fatigue, that continues most days, but is able to complete her daily ADL's without much difficulty. Patient with pain to left side of body, arm and leg, due to fall (as noted above) and rates this pain as a 7/8 on a scale of 10 being the worst. Patient denies; diarrhea, constipation, nausea, fever, UTI, abdominal pain. See additional solicited and other Adverse events with grade and attribution noted below. CT/MRI: CT completed 03/20/21, Dr. Grayland Ormond reviewed results with patient.   Plan: Patient will be schedule to return to clinic for lab (CEA) and adverse events assessment 86-monthvisit. Patient denied having any questions at this time. Patient thanked for her time and encouraged to call with questions or concerns.   Adverse Event Log  Study/Protocol: [name] Cycle: 24 Month Visit Event Grade Onset Date Resolved Date Drug Name Attribution Treatment Comments  Diarrhea 0        Constipation 0         Nausea 0     unrelated    Fatigue 1 01/01/2020 09/05/2020  possible    Fatigue 1  ongoing      Anorexia 1 07/13/2008 09/05/2020  unrelated    Cough 1 08/26/2020 ongoing  Unrelated  States due to bronchitis, treatment completed  Dyspnea 1 08/26/2020 ongoing  unrelated  Due to bronchitis, treatment completed.  Fever 0        UTI 0        Palmer-plantar erythrodesia 0        Total biliruben 0        Neutrophils 0        TSH 0      Not checked 03/25/21  Sensory Peripheral Neuropathy 2 07/11/2019  09/05/2020   unrelated Gabapentin - dose recently increased  Denies any neuropathy symptoms.  Anemia 1 01/01/2020  02/26/2020     Recovered: Hgb 12.7 today  Anemia  1 03/25/21 ongoing    Hgb 11.0  Elevated creatinine 1 11/20/2019  09/05/2020  unrelated    Elevated Creatinine 1 03/25/21     1.06  Abdominal pain 0          Chronic back & left leg pain 2 07/13/2017 ongoing  unrelated  Received injections last week per pain management-09/05/2020 SB  Pain- left side extremities 2 03/19/21 ongoing  unrelated  Related to a fall   LEmmaline Life RN 03/25/2021 2:18 PM

## 2021-03-25 NOTE — Progress Notes (Signed)
Pt c/o left sided arm and rib pain after falling last week. Seen in ER for same. Reports no broken bones but continuous pain.

## 2021-03-26 ENCOUNTER — Telehealth: Payer: Self-pay | Admitting: Emergency Medicine

## 2021-03-26 NOTE — Telephone Encounter (Signed)
error 

## 2021-04-03 ENCOUNTER — Encounter: Payer: Self-pay | Admitting: Oncology

## 2021-04-03 NOTE — Progress Notes (Signed)
Unable to contact patient. Letter will be sent out.

## 2021-04-12 DIAGNOSIS — M545 Low back pain, unspecified: Secondary | ICD-10-CM | POA: Diagnosis not present

## 2021-04-12 DIAGNOSIS — M542 Cervicalgia: Secondary | ICD-10-CM | POA: Diagnosis not present

## 2021-04-12 DIAGNOSIS — G894 Chronic pain syndrome: Secondary | ICD-10-CM | POA: Diagnosis not present

## 2021-04-14 DIAGNOSIS — M545 Low back pain, unspecified: Secondary | ICD-10-CM | POA: Diagnosis not present

## 2021-04-14 DIAGNOSIS — M542 Cervicalgia: Secondary | ICD-10-CM | POA: Diagnosis not present

## 2021-04-14 DIAGNOSIS — G894 Chronic pain syndrome: Secondary | ICD-10-CM | POA: Diagnosis not present

## 2021-05-05 ENCOUNTER — Other Ambulatory Visit: Payer: Self-pay

## 2021-05-05 ENCOUNTER — Ambulatory Visit: Payer: Self-pay

## 2021-05-05 DIAGNOSIS — J209 Acute bronchitis, unspecified: Secondary | ICD-10-CM | POA: Diagnosis not present

## 2021-05-05 DIAGNOSIS — H109 Unspecified conjunctivitis: Secondary | ICD-10-CM | POA: Diagnosis not present

## 2021-05-05 DIAGNOSIS — B9689 Other specified bacterial agents as the cause of diseases classified elsewhere: Secondary | ICD-10-CM | POA: Diagnosis not present

## 2021-05-05 DIAGNOSIS — I10 Essential (primary) hypertension: Secondary | ICD-10-CM

## 2021-05-05 DIAGNOSIS — J019 Acute sinusitis, unspecified: Secondary | ICD-10-CM | POA: Diagnosis not present

## 2021-05-05 MED ORDER — AMLODIPINE BESYLATE 5 MG PO TABS: 5.0000 mg | ORAL_TABLET | Freq: Every day | ORAL | 0 refills | Status: DC

## 2021-05-05 NOTE — Telephone Encounter (Signed)
Pt requesting refill of amlodipine. Pt upset stating that she has requested the refill many times.

## 2021-05-05 NOTE — Telephone Encounter (Signed)
Requested Prescriptions  Pending Prescriptions Disp Refills  . amLODipine (NORVASC) 5 MG tablet 90 tablet 0    Sig: Take 1 tablet (5 mg total) by mouth daily.     Cardiovascular:  Calcium Channel Blockers Failed - 05/05/2021  9:56 AM      Failed - Last BP in normal range    BP Readings from Last 1 Encounters:  03/25/21 (!) 142/96         Passed - Valid encounter within last 6 months    Recent Outpatient Visits          2 months ago Essential hypertension   [place], PA-C   5 months ago Annual physical exam   [place], PA-C   6 months ago Nausea without vomiting   [employer] Just, Laurita Quint, FNP   9 months ago Cough   [employer], Wendee Beavers, [place]   1 year ago [place], Wendee Beavers, [place]      Future Appointments            Tomorrow Fisher, Kirstie Peri, MD [facility], [place]

## 2021-05-05 NOTE — Telephone Encounter (Signed)
Pt c/o SOB at rest and productive cough beginning last week. Phlegm is greenish yellow. Pt uses inhaler for wheezing. Denies wheezing at present. Pt c/o nasal congestion and drainage. Pt has frequent bouts of bronchitis. Pt is able to talk in full sentences.  Care advice given but pt does not want to go to [facility] and prefers an in office visit. Pt offered a virtual visit option but prefers in office.  Called practice to speak to [facility] but no answer.  Care advice given and pt verbalized understanding.   Pt requesting refill of amlodipine.  Called [facility] and no openings today. Called pt back and advised her to go to [facility]. Pt stated she will go.  Reason for Disposition  [1] MILD difficulty breathing (e.g., minimal/no SOB at rest, SOB with walking, pulse <100) AND [2] NEW-onset or WORSE than normal  Answer Assessment - Initial Assessment Questions 1. RESPIRATORY STATUS: "Describe your breathing?" (e.g., wheezing, shortness of breath, unable to speak, severe coughing)      SOB intermittent wheezing 2. ONSET: "When did this breathing problem begin?"      Last week 3. PATTERN "Does the difficult breathing come and go, or has it been constant since it started?"      Constant last few days 4. SEVERITY: "How bad is your breathing?" (e.g., mild, moderate, severe)    - MILD: No SOB at rest, mild SOB with walking, speaks normally in sentences, can lie down, no retractions, pulse < 100.    - MODERATE: SOB at rest, SOB with minimal exertion and prefers to sit, cannot lie down flat, speaks in phrases, mild retractions, audible wheezing, pulse 100-120.    - SEVERE: Very SOB at rest, speaks in single words, struggling to breathe, sitting hunched forward, retractions, pulse > 120      moderate 5. RECURRENT SYMPTOM: "Have you had difficulty breathing before?" If Yes, ask: "When was the last time?" and "What happened that time?"      Yes-no-inhaler and abx 6. CARDIAC HISTORY: "Do you have any history of heart  disease?" (e.g., heart attack, angina, bypass surgery, angioplasty)      HTN 7. LUNG HISTORY: "Do you have any history of lung disease?"  (e.g., pulmonary embolus, asthma, emphysema)     Frequent bronchitis  8. CAUSE: "What do you think is causing the breathing problem?"      Bronchitis- green white yellow 9. OTHER SYMPTOMS: "Do you have any other symptoms? (e.g., dizziness, runny nose, cough, chest pain, fever)     Cough congested weakness nasal 10. O2 SATURATION MONITOR:  "Do you use an oxygen saturation monitor (pulse oximeter) at home?" If Yes, "What is your reading (oxygen level) today?" "What is your usual oxygen saturation reading?" (e.g., 95%)       no 11. PREGNANCY: "Is there any chance you are pregnant?" "When was your last menstrual period?"       no 12. TRAVEL: "Have you traveled out of the country in the last month?" (e.g., travel history, exposures)       no  Protocols used: Breathing Difficulty-A-AH

## 2021-05-06 ENCOUNTER — Ambulatory Visit: Payer: Medicare HMO | Admitting: Family Medicine

## 2021-05-06 NOTE — Progress Notes (Deleted)
Established patient visit   Patient: Carrie Alvarez   DOB: [birth year]   70 y.o. Female  MRN: [id number] Visit Date: 05/06/2021  Today's healthcare provider: Lelon Huh, MD   No chief complaint on file.  Subjective    Cough This is a new problem. Episode onset: 1 week ago. The cough is Productive of sputum (greenish yellow). Associated symptoms include postnasal drip and shortness of breath. Treatments tried: albuterol inhaler.   ***  {Link to patient history deactivated due to formatting error:1}  Medications: Outpatient Medications Prior to Visit  Medication Sig   acetaminophen (TYLENOL) 500 MG tablet Take 500 mg by mouth every 6 (six) hours as needed for mild pain or moderate pain.    albuterol (VENTOLIN HFA) 108 (90 Base) MCG/ACT inhaler Inhale 2 puffs into the lungs every 6 (six) hours as needed for wheezing or shortness of breath.   amLODipine (NORVASC) 5 MG tablet Take 1 tablet (5 mg total) by mouth daily.   atorvastatin (LIPITOR) 10 MG tablet Take 1 tablet (10 mg total) by mouth every morning.   baclofen (LIORESAL) 10 MG tablet Take 1 tablet (10 mg total) by mouth 2 (two) times daily.   cetirizine (ZYRTEC) 10 MG tablet Take 10 mg by mouth daily at 6 (six) AM.   cholecalciferol (VITAMIN D) 1000 units tablet Take 1,000 Units by mouth daily.   fluticasone (FLONASE) 50 MCG/ACT nasal spray Use 2 spray(s) in each nostril once daily   gabapentin (NEURONTIN) 300 MG capsule Take 300-600 mg by mouth 4 (four) times daily as needed (neuropathic pain).    HYDROcodone-acetaminophen (NORCO) 5-325 MG tablet Take 1 tablet by mouth every 4 (four) hours as needed for moderate pain.   letrozole (FEMARA) 2.5 MG tablet Take 2.5 mg by mouth daily.   NARCAN 4 MG/0.1ML LIQD nasal spray kit Place 1 spray into the nose.    ondansetron (ZOFRAN) 4 MG tablet Take 1 tablet (4 mg total) by mouth every 8 (eight) hours as needed for nausea or vomiting.   Oxycodone HCl 10 MG TABS LIMIT ONE  HALF TO ONE TABLET BY MOUTH 3 TO 5 TIMES DAILY IF TOLERATED   PREVIDENT 5000 DRY MOUTH 1.1 % GEL dental gel Place onto teeth at bedtime.   pyridoxine (B-6) 100 MG tablet Take 100 mg by mouth daily.   sertraline (ZOLOFT) 100 MG tablet Take 2 tablets by mouth once daily   vitamin B-12 (CYANOCOBALAMIN) 500 MCG tablet Take 500 mcg by mouth daily.   Facility-Administered Medications Prior to Visit  Medication Dose Route Frequency Provider   heparin lock flush 100 unit/mL  500 Units Intravenous Once [name], MD   heparin lock flush 100 unit/mL  500 Units Intravenous Once [name], MD   sodium chloride flush (NS) 0.9 % injection 10 mL  10 mL Intravenous PRN [name], MD    Review of Systems  HENT:  Positive for congestion (nasal drainage) and postnasal drip.   Respiratory:  Positive for cough and shortness of breath.    {Labs  Heme  Chem  Endocrine  Serology  Results Review (optional):23779}   Objective    There were no vitals taken for this visit. {Show previous vital signs (optional):23777}  Physical Exam  ***  No results found for any visits on 05/06/21.  Assessment & Plan     ***  No follow-ups on file.      {provider attestation***:1}   Lelon Huh, MD  [facility]  Family Practice [phone] (phone) [phone] (fax)  [place]

## 2021-05-12 DIAGNOSIS — G894 Chronic pain syndrome: Secondary | ICD-10-CM | POA: Diagnosis not present

## 2021-05-12 DIAGNOSIS — M545 Low back pain, unspecified: Secondary | ICD-10-CM | POA: Diagnosis not present

## 2021-05-12 DIAGNOSIS — M542 Cervicalgia: Secondary | ICD-10-CM | POA: Diagnosis not present

## 2021-05-12 IMAGING — MG DIGITAL SCREENING BILAT W/ TOMO W/ CAD
8 series · 8 of 24 positions shown · non-contrast
Comparison: Previous exam(s).

CLINICAL DATA: Screening.

EXAM:
DIGITAL SCREENING BILATERAL MAMMOGRAM WITH TOMO AND CAD

[L CC synth-2D]
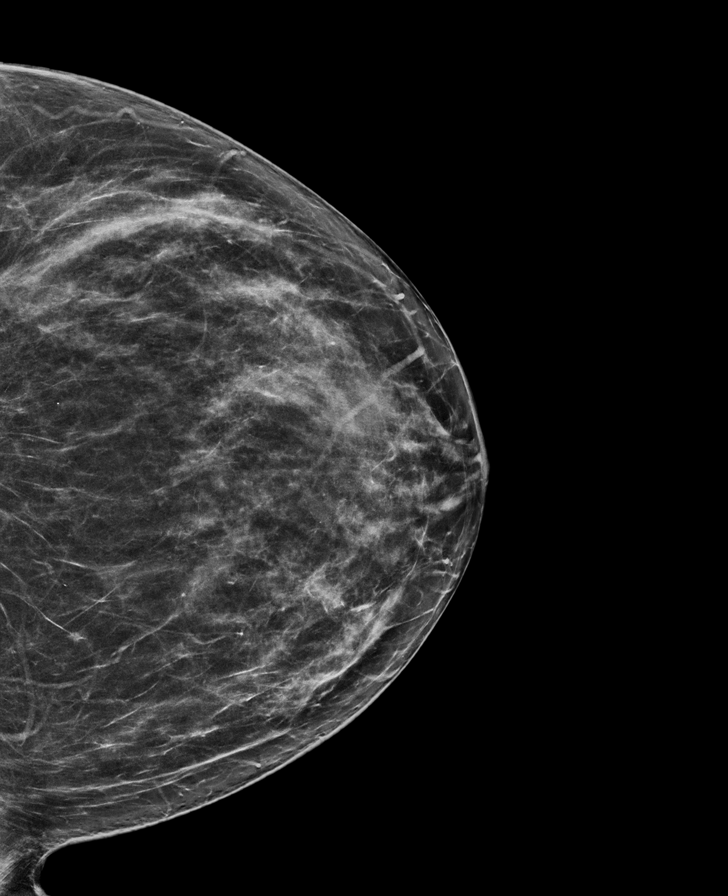

[R CC synth-2D]
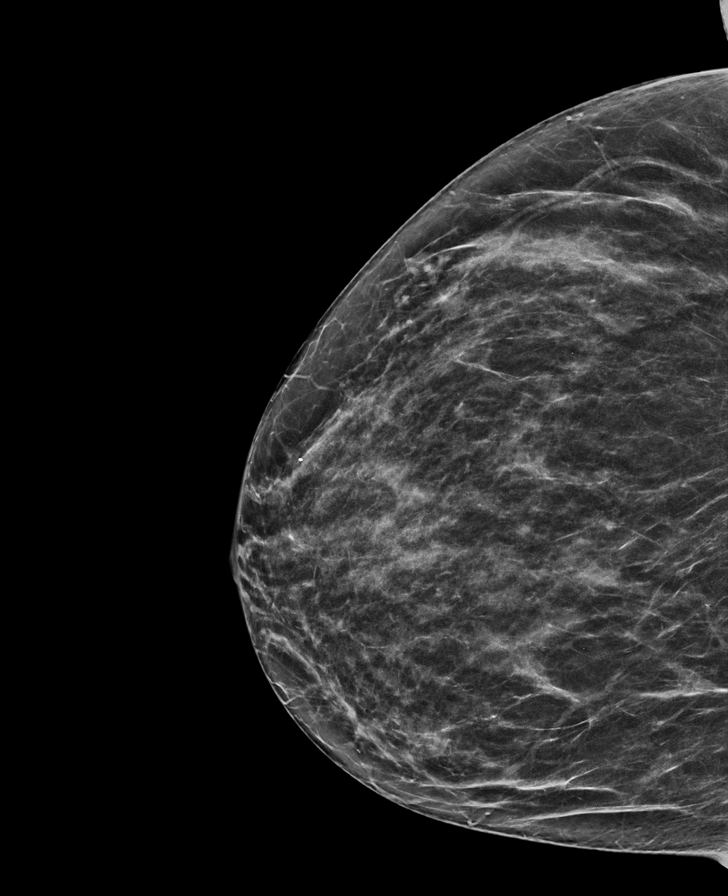

[R MLO synth-2D]
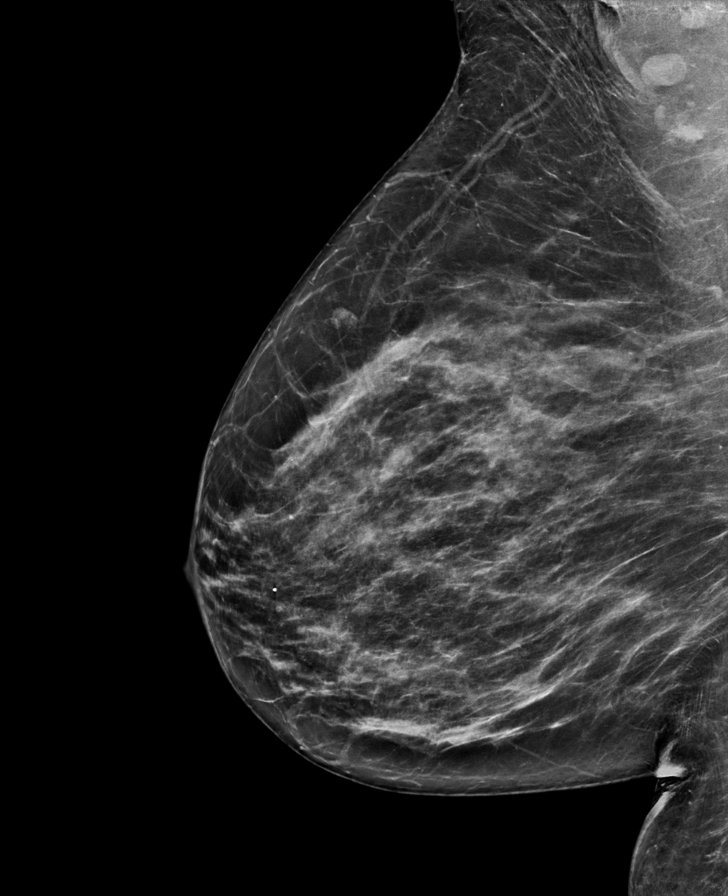

[L MLO synth-2D]
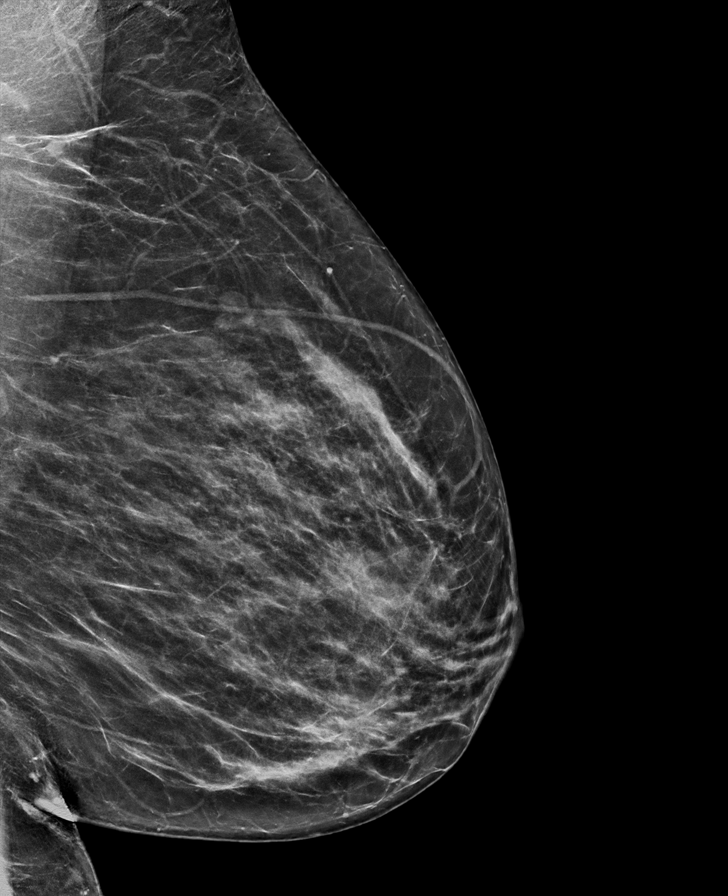

[R CC tomo · tomo slice 31/62.0]
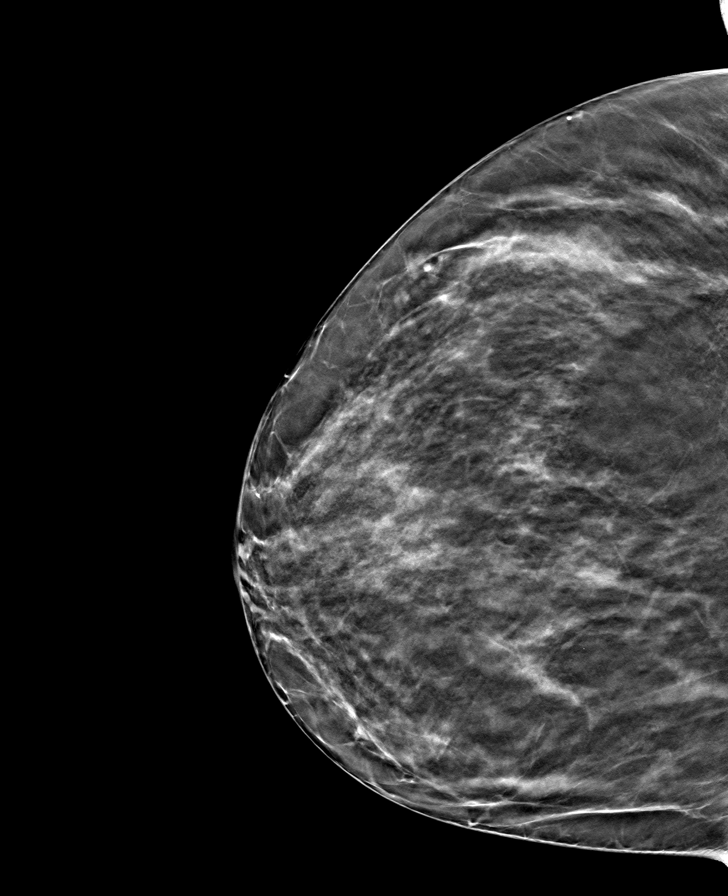

[L MLO tomo · tomo slice 35/68.0]
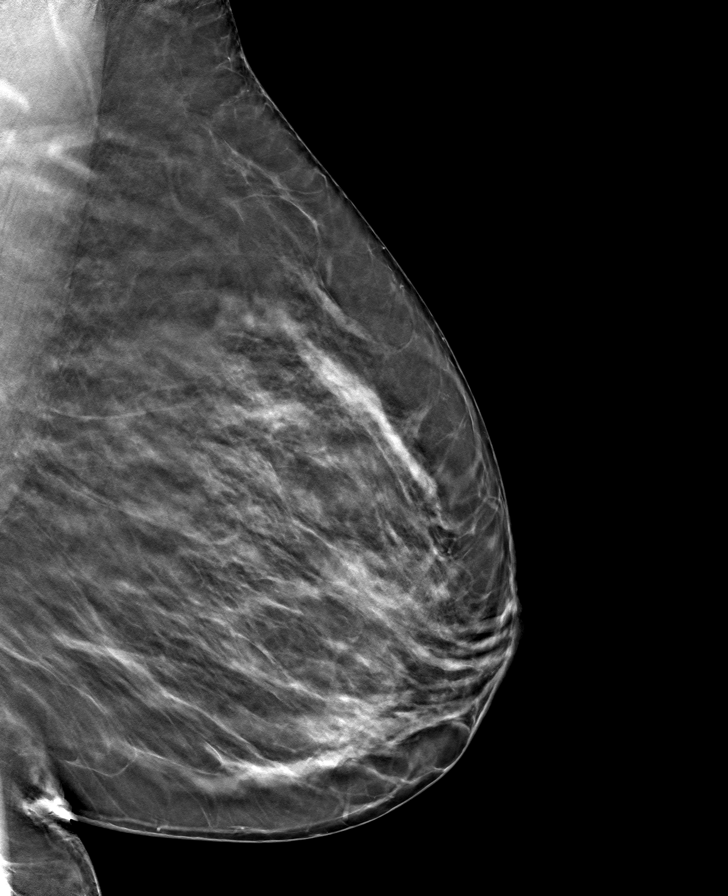

[R MLO tomo · tomo slice 36/71.0]
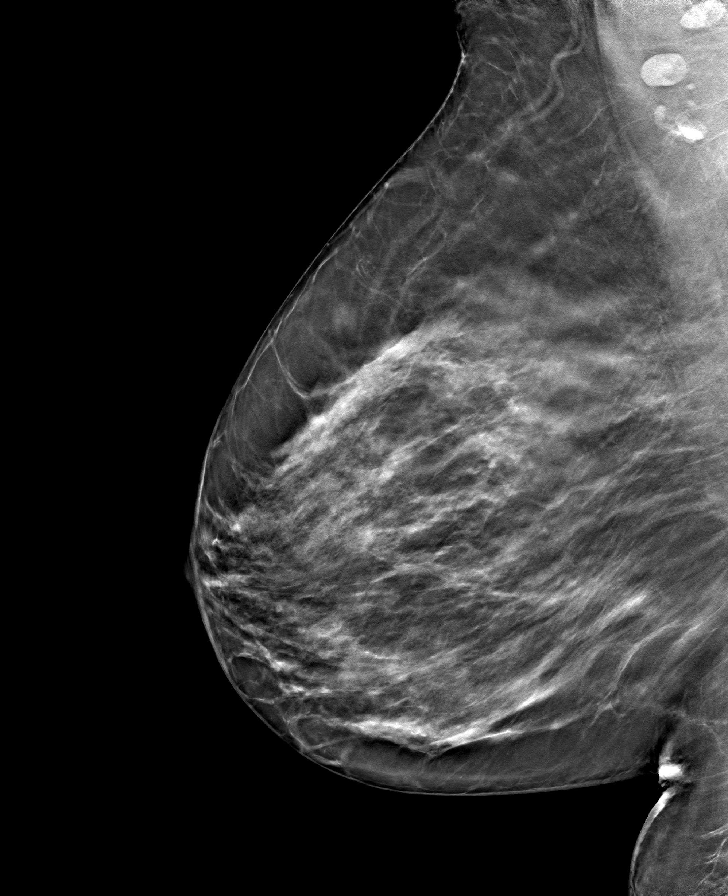

[L CC tomo · tomo slice 35/70.0]
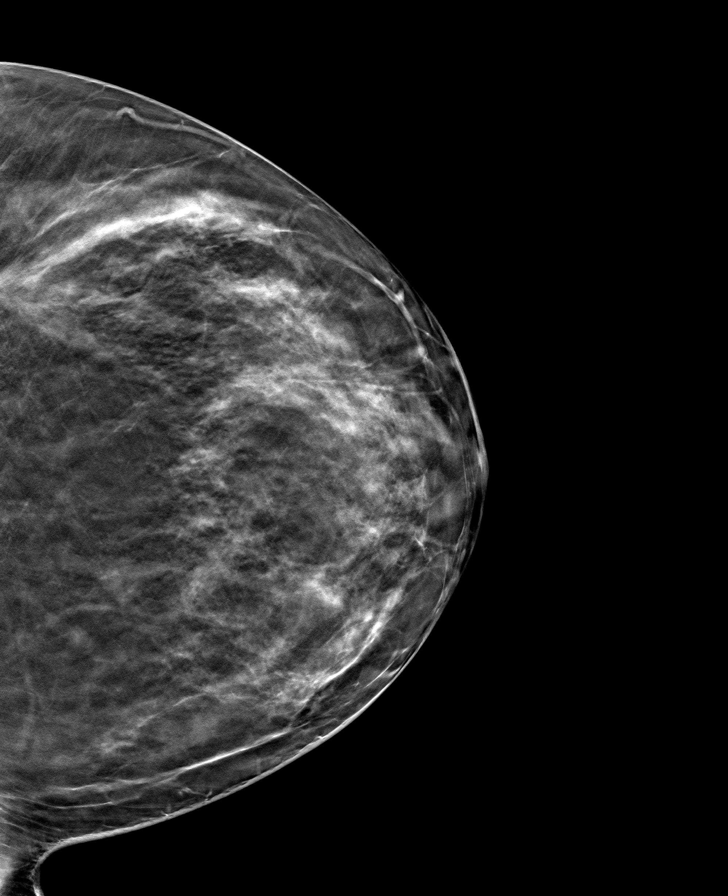

[8 of 24 positions shown; findings below may reference images not displayed]

ACR Breast Density Category c: The breast tissue is heterogeneously
dense, which may obscure small masses.
FINDINGS: There are no findings suspicious for malignancy. Images were
processed with CAD.
IMPRESSION: No mammographic evidence of malignancy. A result letter of this
screening mammogram will be mailed directly to the patient.

RECOMMENDATION:
Screening mammogram in one year. (Code:[id number])

BI-RADS CATEGORY  1: Negative.

## 2021-05-13 ENCOUNTER — Other Ambulatory Visit: Payer: Self-pay

## 2021-05-13 DIAGNOSIS — I10 Essential (primary) hypertension: Secondary | ICD-10-CM

## 2021-05-13 DIAGNOSIS — G894 Chronic pain syndrome: Secondary | ICD-10-CM | POA: Diagnosis not present

## 2021-05-13 DIAGNOSIS — M545 Low back pain, unspecified: Secondary | ICD-10-CM | POA: Diagnosis not present

## 2021-05-13 MED ORDER — AMLODIPINE BESYLATE 5 MG PO TABS: 5.0000 mg | ORAL_TABLET | Freq: Every day | ORAL | 0 refills | Status: DC

## 2021-05-13 NOTE — Telephone Encounter (Signed)
Copied from [place] [id number]. Topic: Quick Communication - Rx Refill/Question >> May 13, 2021  1:55 PM Lenon Curt, [place] A wrote: Medication: amLODipine (NORVASC) 5 MG tablet [[id number]] - patient has been without their medication for 5 days   Has the patient contacted their pharmacy? Yes.  The patient has been in contact with their pharmacy and been directed to contact their PCP. The patient's pharmacy said that they still haven't received a prescription  (Agent: If no, request that the patient contact the pharmacy for the refill. If patient does not wish to contact the pharmacy document the reason why and proceed with request.) (Agent: If yes, when and what did the pharmacy advise?)  Preferred Pharmacy (with phone number or street name): [facility] [place] [place] - [address]  Phone:  [phone] Fax:  [phone]  Has the patient been seen for an appointment in the last year OR does the patient have an upcoming appointment? Yes.    Agent: Please be advised that RX refills may take up to 3 business days. We ask that you follow-up with your pharmacy.

## 2021-05-13 NOTE — Telephone Encounter (Signed)
Requested Prescriptions  Pending Prescriptions Disp Refills  . amLODipine (NORVASC) 5 MG tablet 90 tablet 0    Sig: Take 1 tablet (5 mg total) by mouth daily. OFFICE VISIT NEEDED FOR ADDITIONAL REFILLS     Cardiovascular:  Calcium Channel Blockers Failed - 05/13/2021  3:43 PM      Failed - Last BP in normal range    BP Readings from Last 1 Encounters:  03/25/21 (!) 142/96         Passed - Valid encounter within last 6 months    Recent Outpatient Visits          3 months ago Essential hypertension   [place], PA-C   5 months ago Annual physical exam   [place], PA-C   6 months ago Nausea without vomiting   [employer] [place], Laurita Quint, FNP   9 months ago Cough   [facility] [place], Wendee Beavers, [place]   1 year ago [place], [place], [place]

## 2021-06-09 ENCOUNTER — Ambulatory Visit: Payer: Self-pay | Admitting: *Deleted

## 2021-06-09 ENCOUNTER — Encounter: Payer: Self-pay | Admitting: Oncology

## 2021-06-09 DIAGNOSIS — R519 Headache, unspecified: Secondary | ICD-10-CM | POA: Diagnosis not present

## 2021-06-09 DIAGNOSIS — M545 Low back pain, unspecified: Secondary | ICD-10-CM | POA: Diagnosis not present

## 2021-06-09 DIAGNOSIS — M25559 Pain in unspecified hip: Secondary | ICD-10-CM | POA: Diagnosis not present

## 2021-06-09 DIAGNOSIS — M9943 Connective tissue stenosis of neural canal of lumbar region: Secondary | ICD-10-CM | POA: Diagnosis not present

## 2021-06-09 DIAGNOSIS — M792 Neuralgia and neuritis, unspecified: Secondary | ICD-10-CM | POA: Diagnosis not present

## 2021-06-09 DIAGNOSIS — G8929 Other chronic pain: Secondary | ICD-10-CM | POA: Diagnosis not present

## 2021-06-09 DIAGNOSIS — M48062 Spinal stenosis, lumbar region with neurogenic claudication: Secondary | ICD-10-CM | POA: Diagnosis not present

## 2021-06-09 DIAGNOSIS — M47897 Other spondylosis, lumbosacral region: Secondary | ICD-10-CM | POA: Diagnosis not present

## 2021-06-09 DIAGNOSIS — G894 Chronic pain syndrome: Secondary | ICD-10-CM | POA: Diagnosis not present

## 2021-06-09 DIAGNOSIS — M542 Cervicalgia: Secondary | ICD-10-CM | POA: Diagnosis not present

## 2021-06-09 NOTE — Telephone Encounter (Signed)
Appointment scheduled tomorrow at 4pm with Dr. Caryn Section. Patient plans to take a home COVID test tonight.

## 2021-06-09 NOTE — Telephone Encounter (Signed)
Reason for Disposition  [1] MODERATE weakness (i.e., interferes with work, school, normal activities) AND [2] persists > 3 days  Answer Assessment - Initial Assessment Questions 1. DESCRIPTION: "Describe how you are feeling."     I'm fatigue.   I have diarrhea  Bp 120/89 Diarrhea started today.   Every time I go which has been 5 times I'm having diarrhea.   Since [place]. I don't have any energy. 2. SEVERITY: "How bad is it?"  "Can you stand and walk?"   - MILD - Feels weak or tired, but does not interfere with work, school or normal activities   - [place] to stand and walk; weakness interferes with work, school, or normal activities   - SEVERE - Unable to stand or walk     Moderate  The whole weekend my grandson told me to sit down and rest. 3. ONSET:  "When did the weakness begin?"     Last Thursday.    No fever, no runny nose, no coughing.   I'm having nausea.  I vomited a little mucus yesterday. 4. CAUSE: "What do you think is causing the weakness?"     I don't know 5. MEDICINES: "Have you recently started a new medicine or had a change in the amount of a medicine?"     No 6. OTHER SYMPTOMS: "Do you have any other symptoms?" (e.g., chest pain, fever, cough, SOB, vomiting, diarrhea, bleeding, other areas of pain)      No fever, cough, runny nose. My stomach is hurting 7. PREGNANCY: "Is there any chance you are pregnant?" "When was your last menstrual period?"     N/A  Protocols used: Weakness (Generalized) and Fatigue-A-AH

## 2021-06-09 NOTE — Telephone Encounter (Signed)
I returned pt's call.   She had called in earlier today c/o fatigue and no energy.   She did not have fever, coughing or chills since Friday. She is c/o diarrhea that started today.   She has nausea and her stomach hurts today.    There are no appts available at [facility] so I've sent a note seeing if they can schedule her with one of the provider the [facility] does not schedule for.

## 2021-06-10 ENCOUNTER — Ambulatory Visit (INDEPENDENT_AMBULATORY_CARE_PROVIDER_SITE_OTHER): Payer: Medicare HMO | Admitting: Family Medicine

## 2021-06-10 ENCOUNTER — Encounter: Payer: Self-pay | Admitting: Family Medicine

## 2021-06-10 ENCOUNTER — Other Ambulatory Visit: Payer: Self-pay

## 2021-06-10 VITALS — BP 107/87 | HR 83 | Temp 98.5°F | Resp 18 | Wt 200.0 lb

## 2021-06-10 DIAGNOSIS — R197 Diarrhea, unspecified: Secondary | ICD-10-CM

## 2021-06-10 MED ORDER — METRONIDAZOLE 500 MG PO TABS: 500.0000 mg | ORAL_TABLET | Freq: Two times a day (BID) | ORAL | 0 refills | Status: AC

## 2021-06-10 NOTE — Progress Notes (Signed)
Established patient visit   Patient: Carrie Alvarez   DOB: [birth year]   70 y.o. Female  MRN: [id number] Visit Date: 06/10/2021  Today's healthcare provider: Lelon Huh, MD   Chief Complaint  Patient presents with   Diarrhea   Subjective    This is a new problem. Episode onset: 5 days ago. The problem occurs 5 to 10 times per day. The problem has been gradually worsening. Associated symptoms include abdominal pain, myalgias and vomiting. Pertinent negatives include no chills, coughing, fever or headaches.  no blood in stool. No recent travel.  Patient has been tested for COVID.     Medications: Outpatient Medications Prior to Visit  Medication Sig   acetaminophen (TYLENOL) 500 MG tablet Take 500 mg by mouth every 6 (six) hours as needed for mild pain or moderate pain.    albuterol (VENTOLIN HFA) 108 (90 Base) MCG/ACT inhaler Inhale 2 puffs into the lungs every 6 (six) hours as needed for wheezing or shortness of breath.   amLODipine (NORVASC) 5 MG tablet Take 1 tablet (5 mg total) by mouth daily. OFFICE VISIT NEEDED FOR ADDITIONAL REFILLS   atorvastatin (LIPITOR) 10 MG tablet Take 1 tablet (10 mg total) by mouth every morning.   baclofen (LIORESAL) 10 MG tablet Take 1 tablet (10 mg total) by mouth 2 (two) times daily.   cetirizine (ZYRTEC) 10 MG tablet Take 10 mg by mouth daily at 6 (six) AM.   cholecalciferol (VITAMIN D) 1000 units tablet Take 1,000 Units by mouth daily.   fluticasone (FLONASE) 50 MCG/ACT nasal spray Use 2 spray(s) in each nostril once daily   gabapentin (NEURONTIN) 300 MG capsule Take 300-600 mg by mouth 4 (four) times daily as needed (neuropathic pain).    HYDROcodone-acetaminophen (NORCO) 5-325 MG tablet Take 1 tablet by mouth every 4 (four) hours as needed for moderate pain.   letrozole (FEMARA) 2.5 MG tablet Take 2.5 mg by mouth daily.   NARCAN 4 MG/0.1ML LIQD nasal spray kit Place 1 spray into the nose.    ondansetron (ZOFRAN) 4 MG tablet  Take 1 tablet (4 mg total) by mouth every 8 (eight) hours as needed for nausea or vomiting.   Oxycodone HCl 10 MG TABS LIMIT ONE HALF TO ONE TABLET BY MOUTH 3 TO 5 TIMES DAILY IF TOLERATED   PREVIDENT 5000 DRY MOUTH 1.1 % GEL dental gel Place onto teeth at bedtime.   pyridoxine (B-6) 100 MG tablet Take 100 mg by mouth daily.   sertraline (ZOLOFT) 100 MG tablet Take 2 tablets by mouth once daily   vitamin B-12 (CYANOCOBALAMIN) 500 MCG tablet Take 500 mcg by mouth daily.   Facility-Administered Medications Prior to Visit  Medication Dose Route Frequency Provider   heparin lock flush 100 unit/mL  500 Units Intravenous Once [name], MD   heparin lock flush 100 unit/mL  500 Units Intravenous Once [name], MD   sodium chloride flush (NS) 0.9 % injection 10 mL  10 mL Intravenous PRN [name], MD    Review of Systems  Constitutional:  Positive for fatigue. Negative for appetite change, chills and fever.  Respiratory:  Negative for cough, chest tightness and shortness of breath.   Cardiovascular:  Negative for chest pain and palpitations.  Gastrointestinal:  Positive for abdominal pain, diarrhea, nausea and vomiting.  Endocrine: Positive for cold intolerance.  Musculoskeletal:  Positive for myalgias.  Neurological:  Positive for dizziness, weakness and light-headedness. Negative for headaches.  Objective    BP 107/87 (BP Location: Left Arm, Patient Position: Sitting, Cuff Size: Large)   Pulse 83   Temp 98.5 F (36.9 C) (Oral)   Resp 18   Wt 200 lb (90.7 kg)   SpO2 100% Comment: room air  BMI 35.43 kg/m  {Show previous vital signs (optional):23777}  Physical Exam  General Appearance:    Mildly obese female, alert, cooperative, in no acute distress  Eyes:    PERRL, conjunctiva/corneas clear, EOM's intact       Lungs:     Clear to auscultation bilaterally, respirations unlabored  Heart:    Normal heart rate. Normal rhythm. No murmurs, rubs, or  gallops.    Abdomen:   bowel sounds present and normal in all 4 quadrants, soft, nontender, diffusely tender.          Assessment & Plan     1.Diarrhea of presumed infectious origin  - C difficile Toxins A+B W/Rflx - Calprotectin, Fecal - Clostridium Difficile by PCR - E Coli Shiga Toxin, EIA - Ova and parasite examination - Stool culture  - metroNIDAZOLE (FLAGYL) 500 MG tablet; Take 1 tablet (500 mg total) by mouth 2 (two) times daily for 7 days.  Dispense: 14 tablet; Refill: 0   Strongly encourage push clear liquids.       The entirety of the information documented in the History of Present Illness, Review of Systems and Physical Exam were personally obtained by me. Portions of this information were initially documented by the CMA and reviewed by me for thoroughness and accuracy.     Lelon Huh, MD  [facility] [phone] (phone) [phone] (fax)  [place]

## 2021-07-01 ENCOUNTER — Other Ambulatory Visit: Payer: Self-pay

## 2021-07-01 ENCOUNTER — Ambulatory Visit: Payer: Self-pay | Admitting: *Deleted

## 2021-07-01 ENCOUNTER — Encounter: Payer: Self-pay | Admitting: Emergency Medicine

## 2021-07-01 ENCOUNTER — Encounter: Payer: Self-pay | Admitting: Oncology

## 2021-07-01 ENCOUNTER — Emergency Department
Admission: EM | Admit: 2021-07-01 | Discharge: 2021-07-01 | Disposition: A | Discharge status: Home / Self Care | Payer: Medicare HMO | Attending: Emergency Medicine | Admitting: Emergency Medicine

## 2021-07-01 ENCOUNTER — Emergency Department: Payer: Medicare HMO

## 2021-07-01 DIAGNOSIS — Z79899 Other long term (current) drug therapy: Secondary | ICD-10-CM | POA: Diagnosis not present

## 2021-07-01 DIAGNOSIS — R109 Unspecified abdominal pain: Secondary | ICD-10-CM | POA: Diagnosis not present

## 2021-07-01 DIAGNOSIS — Z853 Personal history of malignant neoplasm of breast: Secondary | ICD-10-CM | POA: Diagnosis not present

## 2021-07-01 DIAGNOSIS — K449 Diaphragmatic hernia without obstruction or gangrene: Secondary | ICD-10-CM | POA: Diagnosis not present

## 2021-07-01 DIAGNOSIS — Z85038 Personal history of other malignant neoplasm of large intestine: Secondary | ICD-10-CM | POA: Diagnosis not present

## 2021-07-01 DIAGNOSIS — I10 Essential (primary) hypertension: Secondary | ICD-10-CM | POA: Insufficient documentation

## 2021-07-01 DIAGNOSIS — Z20822 Contact with and (suspected) exposure to covid-19: Secondary | ICD-10-CM | POA: Insufficient documentation

## 2021-07-01 DIAGNOSIS — Z8579 Personal history of other malignant neoplasms of lymphoid, hematopoietic and related tissues: Secondary | ICD-10-CM | POA: Diagnosis not present

## 2021-07-01 DIAGNOSIS — I700 Atherosclerosis of aorta: Secondary | ICD-10-CM | POA: Insufficient documentation

## 2021-07-01 DIAGNOSIS — Z87891 Personal history of nicotine dependence: Secondary | ICD-10-CM | POA: Diagnosis not present

## 2021-07-01 DIAGNOSIS — K5900 Constipation, unspecified: Secondary | ICD-10-CM

## 2021-07-01 LAB — COMPREHENSIVE METABOLIC PANEL
ALT: 15 U/L (ref 0–44)
AST: 20 U/L (ref 15–41)
Albumin: 4.1 g/dL (ref 3.5–5.0)
Alkaline Phosphatase: 71 U/L (ref 38–126)
Anion gap: 7 (ref 5–15)
BUN: 11 mg/dL (ref 8–23)
CO2: 26 mmol/L (ref 22–32)
Calcium: 10.1 mg/dL (ref 8.9–10.3)
Chloride: 105 mmol/L (ref 98–111)
Creatinine, Ser: 0.82 mg/dL (ref 0.44–1.00)
GFR, Estimated: >60 mL/min (ref >60)
Glucose, Bld: 94 mg/dL (ref 70–99)
Potassium: 3.5 mmol/L (ref 3.5–5.1)
Sodium: 138 mmol/L (ref 135–145)
Total Bilirubin: 1.3 mg/dL — ABNORMAL HIGH (ref 0.3–1.2)
Total Protein: 7.5 g/dL (ref 6.5–8.1)

## 2021-07-01 LAB — CBC WITH DIFFERENTIAL/PLATELET
Abs Immature Granulocytes: 0.04 10*3/uL (ref 0.00–0.07)
Basophils Absolute: 0.1 10*3/uL (ref 0.0–0.1)
Basophils Relative: 1 %
Eosinophils Absolute: 0.2 10*3/uL (ref 0.0–0.5)
Eosinophils Relative: 2 %
HCT: 36.4 % (ref 36.0–46.0)
Hemoglobin: 13 g/dL (ref 12.0–15.0)
Immature Granulocytes: 0 %
Lymphocytes Relative: 24 %
Lymphs Abs: 2.1 10*3/uL (ref 0.7–4.0)
MCH: 30 pg (ref 26.0–34.0)
MCHC: 35.7 g/dL (ref 30.0–36.0)
MCV: 84.1 fL (ref 80.0–100.0)
Monocytes Absolute: 0.5 10*3/uL (ref 0.1–1.0)
Monocytes Relative: 5 %
Neutro Abs: 6.1 10*3/uL (ref 1.7–7.7)
Neutrophils Relative %: 68 %
Platelets: 289 10*3/uL (ref 150–400)
RBC: 4.33 MIL/uL (ref 3.87–5.11)
RDW: 14.4 % (ref 11.5–15.5)
WBC: 9 10*3/uL (ref 4.0–10.5)
nRBC: 0 % (ref 0.0–0.2)

## 2021-07-01 LAB — URINALYSIS, ROUTINE W REFLEX MICROSCOPIC
Bilirubin Urine: NEGATIVE
Glucose, UA: NEGATIVE mg/dL
Hgb urine dipstick: NEGATIVE
Ketones, ur: NEGATIVE mg/dL
Leukocytes,Ua: NEGATIVE
Nitrite: NEGATIVE
Protein, ur: NEGATIVE mg/dL
Specific Gravity, Urine: 1.015 (ref 1.005–1.030)
pH: 7 (ref 5.0–8.0)

## 2021-07-01 LAB — LIPASE, BLOOD: Lipase: 26 U/L (ref 11–51)

## 2021-07-01 LAB — RESP PANEL BY RT-PCR (FLU A&B, COVID) ARPGX2
Influenza A by PCR: NEGATIVE
Influenza B by PCR: NEGATIVE
SARS Coronavirus 2 by RT PCR: NEGATIVE

## 2021-07-01 MED ORDER — ONDANSETRON HCL 4 MG PO TABS: 4.0000 mg | ORAL_TABLET | Freq: Three times a day (TID) | ORAL | 0 refills | Status: DC | PRN

## 2021-07-01 MED ORDER — FLEET ENEMA 7-19 GM/118ML RE ENEM
1.0000 | ENEMA | Freq: Once | RECTAL | Status: AC
  Administered 2021-07-01: 1 via RECTAL

## 2021-07-01 MED ORDER — METOCLOPRAMIDE HCL 5 MG/ML IJ SOLN
10.0000 mg | Freq: Once | INTRAMUSCULAR | Status: AC
  Administered 2021-07-01: 10 mg via INTRAVENOUS
  Filled 2021-07-01: qty 2

## 2021-07-01 MED ORDER — IOHEXOL 300 MG/ML  SOLN
100.0000 mL | Freq: Once | INTRAMUSCULAR | Status: AC | PRN
  Administered 2021-07-01: 100 mL via INTRAVENOUS

## 2021-07-01 NOTE — Discharge Instructions (Addendum)
Your CT today showed: IMPRESSION: 1. Couple scattered colonic diverticula. No associated acute diverticulitis. 2. Small hiatal hernia. 3. Grade 1 anterolisthesis of L4 on L5. 4.  Aortic Atherosclerosis (ICD10-I70.0).     Bowel Cleanout   When you wake up: Begin a liquid diet. (See list below for suggestions.) Take 2 Dulcolax (bisacodyl) tablets. (DO NOT CHEW.) Begin MiraLAX on a daily basis per instructions 1 hour after waking up: Mix the entire 238-gram bottle of MiraLAX with 64 ounces of a sports drink. Drink all of the mixture over the next few hours until gone. (Suggestion: An 8-ounce glass every 15-30 minutes equals 2-4 hours.) It is very important to drink plenty of water and other liquids in order to avoid dehydration and to flush the bowel. (Although alcohol is a liquid, it can make you dehydrated. You should NOT drink alcohol while doing the cleanout.) NOTE: Please stay home once you have started your cleanout. Also, the use of moist towelettes or wipes may help to minimize discomfort during the cleanout. A nonprescription 1% hydrocortisone cream may also be soothing when applied to the rectal area after each bowel movement. It is common during the cleanout to experience some nausea, bloating, and/or abdominal distention. If you chilled the mixture prior to drinking it, you could experience chills from consuming so much cold liquid in a short time period. If you develop nausea or vomiting, slow down the rate at which you drink the solution. Please attempt to drink all of the laxative solution even if it takes you longer. Once stooling slows down, you may resume eating solid food.

## 2021-07-01 NOTE — Telephone Encounter (Signed)
To appt to be seen or go to urgent care.

## 2021-07-01 NOTE — ED Provider Notes (Signed)
[facility] Emergency Department Provider Note  ____________________________________________   Event Date/Time   First MD Initiated Contact with Patient 07/01/21 1957     (approximate)  I have reviewed the triage vital signs and the nursing notes.   HISTORY  Chief Complaint Emesis, Hypertension, and Dizziness   HPI Carrie Alvarez is a 70 y.o. female with below noted past medical history who presents for assessment of 3 days of some nausea and dizziness as well as some crampy lower abdominal pain and constipation.  Patient states she vomited yesterday but has not vomited today.  She states she had a very small bowel movement with a hard stool today and took over-the-counter stool softener to constipation does not feel this helped much.  She has not had any difficulty urinating and denies any urinary symptoms.  She denies any back pain, chest pain, cough, shortness of breath, fevers, chills, headache or earache, sore throat rash or extremity pain.  No other acute concerns at this time.         Past Medical History:  Diagnosis Date   Allergy    Anemia    Arthritis    Back pain    Breast cancer ([place]) 2015   LT LUMPECTOMY 12-2014 FOLLOWING CHEMO   Carpal tunnel syndrome    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   Cataract    Chronic pain    Colon cancer ([place]) 01/2019   Depression    Dyspnea    Family history of breast cancer    Family history of colon cancer    GERD (gastroesophageal reflux disease)    problem with reflux during chemo   History of methicillin resistant staphylococcus aureus (MRSA)    Hypercholesteremia    Hypertension    Lumbosacral pain    sees pain management in [place]   Obesity    Personal history of chemotherapy 2016   left breast ca   Personal history of chemotherapy 2020   colon ca   Pinched nerve    left side, lumbar area   Uterine cancer ([place]) 1994    Patient Active Problem List   Diagnosis Date Noted    Polyp of descending colon    MLH1-related Lynch syndrome (HNPCC2) 06/01/2019   Genetic testing 06/01/2019   Family history of breast cancer    Family history of colon cancer    Goals of care, counseling/discussion 01/22/2019   Colon cancer ([place]) 01/10/2019   Personal history of colonic polyps    Polyp of transverse colon    Neoplasm of digestive system    Morbid obesity ([place]) 06/07/2018   Acute cholecystitis    Transaminitis    Cholangitis 04/18/2016   Allergic rhinitis 09/04/2015   AA (alopecia areata) 09/04/2015   Arthritis, degenerative 09/04/2015   Cancer of uterus ([place]) 09/04/2015   Avitaminosis D 09/04/2015   Benign positional vertigo 09/04/2015   Hypertension 03/15/2015   DDD (degenerative disc disease), lumbar 03/07/2015   Dizziness 12/25/2014   Lumbosacral facet joint syndrome 12/10/2014   Sacroiliac joint dysfunction 12/10/2014   DDD (degenerative disc disease), lumbosacral 11/13/2014   Depression 10/13/2014   Breast cancer metastasized to axillary lymph node ([place]) 06/21/2014   Hypercholesteremia 08/30/2009   Compulsive tobacco user syndrome 04/21/2008   Adaptation reaction 07/18/2007    Past Surgical History:  Procedure Laterality Date   ABDOMINAL HYSTERECTOMY  1994   UTERINE CA   AXILLARY LYMPH NODE BIOPSY Left 12/18/2014   Procedure: AXILLARY LYMPH NODE BIOPSY/;  Surgeon: Robert Bellow, MD;  Location: [facility];  Service: General;  Laterality: Left;   AXILLARY LYMPH NODE DISSECTION Left 12/18/2014   Procedure: AXILLARY LYMPH NODE DISSECTION;  Surgeon: Robert Bellow, MD;  Location: [facility];  Service: General;  Laterality: Left;   BREAST BIOPSY Left 05/2014   CORE BX OF LN, METASTATIC ADENOCARCINOMA   BREAST LUMPECTOMY Left 12/2014   CHEMO FIRST THEN SURGERY OF LN   BREAST SURGERY     lymph node removal   CATARACT EXTRACTION W/PHACO Left 11/26/2020   Procedure: CATARACT EXTRACTION PHACO AND INTRAOCULAR LENS PLACEMENT ([place]) LEFT;  Surgeon: Birder Robson, MD;  Location: [place];  Service: Ophthalmology;  Laterality: Left;  6.40 00:43.9   CATARACT EXTRACTION W/PHACO Right 12/10/2020   Procedure: CATARACT EXTRACTION PHACO AND INTRAOCULAR LENS PLACEMENT ([facility]) RIGHT;  Surgeon: Birder Robson, MD;  Location: [place];  Service: Ophthalmology;  Laterality: Right;  7.59 0:47.4   CHOLECYSTECTOMY N/A 04/19/2016   Procedure: LAPAROSCOPIC CHOLECYSTECTOMY WITH INTRAOPERATIVE CHOLANGIOGRAM;  Surgeon: Hubbard Robinson, MD;  Location: [facility];  Service: General;  Laterality: N/A;   COLON RESECTION Right 01/10/2019   Procedure: HAND ASSISTED LAPAROSCOPIC RIGHT COLON RESECTION;  Surgeon: Jules Husbands, MD;  Location: [facility];  Service: General;  Laterality: Right;   COLONOSCOPY WITH PROPOFOL N/A 12/30/2018   Procedure: COLONOSCOPY WITH PROPOFOL;  Surgeon: Lucilla Lame, MD;  Location: [place];  Service: Endoscopy;  Laterality: N/A;   COLONOSCOPY WITH PROPOFOL N/A 03/05/2020   Procedure: COLONOSCOPY WITH PROPOFOL;  Surgeon: Lucilla Lame, MD;  Location: [facility] ENDOSCOPY;  Service: Endoscopy;  Laterality: N/A;   medial branch block  11/06/2015   lumbar facet Dr. Primus Bravo   POLYPECTOMY N/A 12/30/2018   Procedure: POLYPECTOMY;  Surgeon: Lucilla Lame, MD;  Location: [place];  Service: Endoscopy;  Laterality: N/A;  Clips placed at Hepatic Flexure Polyp (2) and Transverse Colon Polyp (4) removal sites   PORTACATH PLACEMENT Right 02/02/2019   Procedure: INSERTION PORT-A-CATH;  Surgeon: Jules Husbands, MD;  Location: [facility];  Service: General;  Laterality: Right;   RADIOFREQUENCY ABLATION     lumbar   SENTINEL NODE BIOPSY Left 12/18/2014   Procedure: SENTINEL NODE BIOPSY;  Surgeon: Robert Bellow, MD;  Location: [facility];  Service: General;  Laterality: Left;    Prior to Admission medications   Medication Sig Start Date End Date Taking? Authorizing Provider  ondansetron (ZOFRAN) 4 MG tablet Take 1 tablet (4 mg  total) by mouth every 8 (eight) hours as needed for up to 10 doses for nausea or vomiting. 07/01/21  Yes Lucrezia Starch, MD  acetaminophen (TYLENOL) 500 MG tablet Take 500 mg by mouth every 6 (six) hours as needed for mild pain or moderate pain.     [provider]  albuterol (VENTOLIN HFA) 108 (90 Base) MCG/ACT inhaler Inhale 2 puffs into the lungs every 6 (six) hours as needed for wheezing or shortness of breath. 02/11/21   Chrismon, Vickki Muff, PA-C  amLODipine (NORVASC) 5 MG tablet Take 1 tablet (5 mg total) by mouth daily. OFFICE VISIT NEEDED FOR ADDITIONAL REFILLS 05/13/21   Jerrol Banana., MD  atorvastatin (LIPITOR) 10 MG tablet Take 1 tablet (10 mg total) by mouth every morning. 12/30/20   Chrismon, Vickki Muff, PA-C  baclofen (LIORESAL) 10 MG tablet Take 1 tablet (10 mg total) by mouth 2 (two) times daily. 10/28/17   Trinna Post, PA-C  cetirizine (ZYRTEC) 10 MG tablet Take  10 mg by mouth daily at 6 (six) AM.    [provider]  cholecalciferol (VITAMIN D) 1000 units tablet Take 1,000 Units by mouth daily.    [provider]  fluticasone Asencion Islam) 50 MCG/ACT nasal spray Use 2 spray(s) in each nostril once daily 02/11/21   Chrismon, Simona Huh E, PA-C  gabapentin (NEURONTIN) 300 MG capsule Take 300-600 mg by mouth 4 (four) times daily as needed (neuropathic pain).     [provider]  HYDROcodone-acetaminophen (NORCO) 5-325 MG tablet Take 1 tablet by mouth every 4 (four) hours as needed for moderate pain. 03/19/21   Naaman Plummer, MD  letrozole [facility]) 2.5 MG tablet Take 2.5 mg by mouth daily. 04/07/19   [provider]  NARCAN 4 MG/0.1ML LIQD nasal spray kit Place 1 spray into the nose.  06/06/19   [provider]  Oxycodone HCl 10 MG TABS LIMIT ONE HALF TO ONE TABLET BY MOUTH 3 TO 5 TIMES DAILY IF TOLERATED 02/21/19   [provider]  PREVIDENT 5000 DRY MOUTH 1.1 % GEL dental gel Place onto teeth at bedtime. 06/28/20   [provider]  pyridoxine (B-6) 100 MG tablet Take 100 mg by mouth daily.    [provider]  sertraline (ZOLOFT) 100 MG tablet Take 2 tablets by mouth once daily 10/24/20   Chrismon, Vickki Muff, PA-C  vitamin B-12 (CYANOCOBALAMIN) 500 MCG tablet Take 500 mcg by mouth daily.    [provider]    Allergies Patient has no known allergies.  Family History  Problem Relation Age of Onset   Breast cancer Sister 55   Colon cancer Sister    Colon cancer Mother        dx 31s   Hypertension Mother    Asthma Mother    Cancer Father        voice box removed   Cancer Maternal Grandmother        unsure type   Colon cancer Cousin    Cancer Cousin        unsure type    Social History Social History   Tobacco Use   Smoking status: Former    Packs/day: 0.50    Types: Cigarettes    Quit date: 07/18/2014    Years since quitting: 6.9   Smokeless tobacco: Never  Vaping Use   Vaping Use: Never used  Substance Use Topics   Alcohol use: No    Alcohol/week: 0.0 standard drinks   Drug use: No    Review of Systems  Review of Systems  Constitutional:  Negative for chills and fever.  HENT:  Negative for sore throat.   Eyes:  Negative for pain.  Respiratory:  Negative for cough and stridor.   Cardiovascular:  Negative for chest pain.  Gastrointestinal:  Positive for abdominal pain, constipation and nausea. Negative for blood in stool, diarrhea, melena and vomiting.  Genitourinary:  Negative for dysuria.  Musculoskeletal:  Negative for myalgias.  Skin:  Negative for rash.  Neurological:  Positive for dizziness. Negative for seizures, loss of consciousness and headaches.  Psychiatric/Behavioral:  Negative for suicidal ideas.   All other systems reviewed and are negative.    ____________________________________________   PHYSICAL EXAM:  VITAL SIGNS: ED Triage Vitals  Enc Vitals Group     BP 07/01/21 1835 (!) 134/109     Pulse Rate 07/01/21 1835 85     Resp 07/01/21  1835 18     Temp 07/01/21 1835 98.4 F (36.9 C)  Temp Source 07/01/21 1835 Oral     SpO2 07/01/21 1835 98 %     Weight 07/01/21 1844 200 lb (90.7 kg)     Height 07/01/21 1844 5' 3"  (1.6 m)     Head Circumference --      Peak Flow --      Pain Score 07/01/21 1844 6     Pain Loc --      Pain Edu? --      Excl. in [place]? --    Vitals:   07/01/21 2145 07/01/21 2237  BP:  (!) 140/98  Pulse: (!) 111 100  Resp:  17  Temp:    SpO2: 100% 98%   Physical Exam Vitals and nursing note reviewed.  Constitutional:      General: She is not in acute distress.    Appearance: She is well-developed.  HENT:     Head: Normocephalic and atraumatic.     Right Ear: External ear normal.     Left Ear: External ear normal.     Nose: Nose normal.  Eyes:     Conjunctiva/sclera: Conjunctivae normal.  Cardiovascular:     Rate and Rhythm: Normal rate and regular rhythm.     Heart sounds: No murmur heard. Pulmonary:     Effort: Pulmonary effort is normal. No respiratory distress.     Breath sounds: Normal breath sounds.  Abdominal:     Palpations: Abdomen is soft.     Tenderness: There is abdominal tenderness.  Musculoskeletal:        General: No swelling.     Cervical back: Neck supple.  Skin:    General: Skin is warm and dry.     Capillary Refill: Capillary refill takes less than 2 seconds.  Neurological:     Mental Status: She is alert and oriented to person, place, and time.  Psychiatric:        Mood and Affect: Mood normal.     ____________________________________________   LABS (all labs ordered are listed, but only abnormal results are displayed)  Labs Reviewed  COMPREHENSIVE METABOLIC PANEL - Abnormal; Notable for the following components:      Result Value   Total Bilirubin 1.3 (*)    All other components within normal limits  RESP PANEL BY RT-PCR (FLU A&B, COVID) ARPGX2  LIPASE, BLOOD  CBC WITH DIFFERENTIAL/PLATELET  URINALYSIS, ROUTINE W REFLEX MICROSCOPIC    ____________________________________________  EKG  ECG shows sinus rhythm with a ventricular rate of 98, normal axis, unremarkable intervals without evidence of acute ischemia or significant arrhythmia. ____________________________________________  RADIOLOGY  ED MD interpretation: CT abdomen pelvis with some scattered diverticuli without evidence of diverticulitis, appendicitis, cholecystitis, kidney stone, abscess, SBO or other acute abdominal pelvic process.  There is evidence of some anterolisthesis of L4 and L5 and aortic atherosclerosis.  There is also evidence of a small hiatal hernia.  Official radiology report(s): CT Abdomen Pelvis W Contrast  Result Date: 07/01/2021 CLINICAL DATA:  LLQ abdominal pain. c/o dizziness, HTN and not able to urinate, also having N/V EXAM: CT ABDOMEN AND PELVIS WITH CONTRAST TECHNIQUE: Multidetector CT imaging of the abdomen and pelvis was performed using the standard protocol following bolus administration of intravenous contrast. CONTRAST:  1105m OMNIPAQUE IOHEXOL 300 MG/ML  SOLN COMPARISON:  None. FINDINGS: Lower chest: No acute abnormality.  Small hiatal hernia. Hepatobiliary: No focal liver abnormality. Status post cholecystectomy. No biliary dilatation. Pancreas: No focal lesion. Normal pancreatic contour. No surrounding inflammatory changes. No main pancreatic ductal dilatation. Spleen: Normal  in size without focal abnormality. Adrenals/Urinary Tract: No adrenal nodule bilaterally. Bilateral kidneys enhance symmetrically. No hydronephrosis. No hydroureter. The urinary bladder is unremarkable. On delayed imaging, there is no urothelial wall thickening and there are no filling defects in the opacified portions of the bilateral collecting systems or ureters. Stomach/Bowel: Surgical changes related to a partial large bowel resection. Stomach is within normal limits. No evidence of bowel wall thickening or dilatation. Couple scattered colonic diverticula.  Vascular/Lymphatic: No abdominal aorta or iliac aneurysm. Mild to moderate atherosclerotic plaque of the aorta and its branches. No abdominal, pelvic, or inguinal lymphadenopathy. Reproductive: Status post hysterectomy. No adnexal masses. Other: No intraperitoneal free fluid. No intraperitoneal free gas. No organized fluid collection. Musculoskeletal: No abdominal wall hernia or abnormality. No suspicious lytic or blastic osseous lesions. No acute displaced fracture. Grade 1 anterolisthesis of L4 on L5. IMPRESSION: 1. Couple scattered colonic diverticula. No associated acute diverticulitis. 2. Small hiatal hernia. 3. Grade 1 anterolisthesis of L4 on L5. 4.  Aortic Atherosclerosis (ICD10-I70.0). Electronically Signed   By: Iven Finn M.D.   On: 07/01/2021 20:18    ____________________________________________   PROCEDURES  Procedure(s) performed (including Critical Care):  Procedures   ____________________________________________   INITIAL IMPRESSION / ASSESSMENT AND PLAN / ED COURSE      Patient presents with above-stated history and exam for assessment of some lower crampy abdominal pain associate with nausea and vomiting as well as some dizziness earlier today.  Patient states she was worried was related to her blood pressure.  She took an over-the-counter stool softener and does not feel this is helped her constipation.  She thinks this may be the primary cause of her symptoms.  Possibly possible patient is experiencing fairly significant symptomatic constipation additional differential considerations include SBO, diverticulitis cystitis, kidney stone, appendicitis, pancreatitis, cholecystitis, atypical presentation for ACS and metabolic derangements.  ECG is not suggestive of cardiac ischemia.  COVID and influenza PCR is negative.  CMP shows no significant electrolyte or metabolic derangements.  No evidence of hepatitis and no focal tenderness in the right upper quadrant.  Lipase not  consistent with acute pancreatitis.  CBC shows no leukocytosis or acute anemia.  UA is not suggestive of cystitis.  CT abdomen pelvis with some scattered diverticuli without evidence of diverticulitis, appendicitis, cholecystitis, kidney stone, abscess, SBO or other acute abdominal pelvic process.  There is evidence of some anterolisthesis of L4 and L5 and aortic atherosclerosis.  There is also evidence of a small hiatal hernia.  Given some Reglan and a Fleet enema and following this was able to have significant large bowel movement.  She states she feels vastly much better and is able to tolerate p.o.  Given improvement in symptoms with patient tolerating p.o. and otherwise reassuring exam work-up I think she is stable for discharge close outpatient PCP follow-up.  Recommended MiraLAX bowel cleanout and will prescribe short course of Zofran to help with this.  Discharged in stable condition.  Strict return precautions advised and discussed.     ____________________________________________   FINAL CLINICAL IMPRESSION(S) / ED DIAGNOSES  Final diagnoses:  Constipation, unspecified constipation type  Hypertension, unspecified type  Hiatal hernia  Aortic atherosclerosis (HCC)    Medications  iohexol (OMNIPAQUE) 300 MG/ML solution 100 mL (100 mLs Intravenous Contrast Given 07/01/21 1946)  metoCLOPramide (REGLAN) injection 10 mg (10 mg Intravenous Given 07/01/21 2110)  sodium phosphate (FLEET) 7-19 GM/118ML enema 1 enema (1 enema Rectal Given 07/01/21 2205)     ED Discharge Orders  Ordered    ondansetron (ZOFRAN) 4 MG tablet  Every 8 hours PRN        07/01/21 2241             Note:  This document was prepared using Dragon voice recognition software and may include unintentional dictation errors.    Lucrezia Starch, MD 07/01/21 [phone]

## 2021-07-01 NOTE — Telephone Encounter (Signed)
C/o stomach discomfort x 3 days, unable to have BM N/V x 3 days .   Called patient to review sx.   Chief Complaint: N/V constipation x 3 days. Requesting appt / medication  Symptoms: low - middle abdominal pain , nausea, vomiting, unable to eat  unable to have BM only small hard BM. Frequency: x 3 days  Pertinent Negatives: Patient denies vomiting now  Disposition: [] ED /[x] Urgent Care (no appt availability in office) / [] Appointment(In office/virtual)/ [x]  [facility]/ [] Home Care/ [] Refused Recommended Disposition  Additional Notes:  No available appt prior to Jan . Recommended UC or My Chart E- visit today .  Has been taking oxycodone for pain . Hx c- diff 06/10/21. Please advise .        Reason for Disposition  Unable to have a bowel movement (BM) without manually removing stool (using finger to pull out stool or perform disimpaction)    Has not attempted removal of stool manually  Answer Assessment - Initial Assessment Questions 1. STOOL PATTERN OR FREQUENCY: "How often do you have a bowel movement (BM)?"  (Normal range: 3 times a day to every 3 days)  "When was your last BM?"       Every day  2. STRAINING: "Do you have to strain to have a BM?"      Yes  3. RECTAL PAIN: "Does your rectum hurt when the stool comes out?" If Yes, ask: "Do you have hemorrhoids? How bad is the pain?"  (Scale 1-10; or mild, moderate, severe)     No  4. STOOL COMPOSITION: "Are the stools hard?"      Hard  5. BLOOD ON STOOLS: "Has there been any blood on the toilet tissue or on the surface of the BM?" If Yes, ask: "When was the last time?"      No  6. CHRONIC CONSTIPATION: "Is this a new problem for you?"  If no, ask: "How long have you had this problem?" (days, weeks, months)      No  7. CHANGES IN DIET OR HYDRATION: "Have there been any recent changes in your diet?" "How much fluids are you drinking on a daily basis?"  "How much have you had to drink today?"     Yes  unable to eat due  to nausea 8. MEDICATIONS: "Have you been ing any new medications?" "Are you taking any narcotic pain medications?" (e.g., Vicodin, Percocet, morphine, Dilaudid)     Yes oxycodone  9. LAXATIVES: "Have you been using any stool softeners, laxatives, or enemas?"  If yes, ask "What, how often, and when was the last time?"     no 10. ACTIVITY:  "How much walking do you do every day?"  "Has your activity level decreased in the past week?"        Not much  11. CAUSE: "What do you think is causing the constipation?"        Pain medication 12. OTHER SYMPTOMS: "Do you have any other symptoms?" (e.g., abdominal pain, bloating, fever, vomiting)       Abdominal discomfort, low to middle abd. N/V unable to eat  13. MEDICAL HISTORY: "Do you have a history of hemorrhoids, rectal fissures, or rectal surgery or rectal abscess?"         Hx c diff 06/10/21 14. PREGNANCY: "Is there any chance you are pregnant?" "When was your last menstrual period?"       na  Protocols used: Constipation-A-AH

## 2021-07-01 NOTE — ED Triage Notes (Signed)
Pt to ED via POV with c/o dizziness, HTN and not able to urinate, also having N/V

## 2021-07-01 NOTE — ED Provider Notes (Signed)
  Emergency Medicine Provider Triage Evaluation Note  Carrie Alvarez , a 70 y.o.female,  was evaluated in triage.  Pt complains of lower abdominal pain and nausea.  Patient states that this been going on for the past 3 days.  Additionally reports dizziness.  Denies chest pain, shortness of breath, back pain, or urinary symptoms.   Review of Systems  Positive: Abdominal pain, nausea, diarrhea Negative: Denies fever, chest pain, vomiting  Physical Exam   Vitals:   07/01/21 1835  BP: (!) 134/109  Pulse: 85  Resp: 18  Temp: 98.4 F (36.9 C)  SpO2: 98%   Gen:   Awake, no distress   Resp:  Normal effort  MSK:   Moves extremities without difficulty  Other:  Significant tenderness when palpating the lower left and lower right quadrant.  Medical Decision Making  Given the patient's initial medical screening exam, the following diagnostic evaluation has been ordered. The patient will be placed in the appropriate treatment space, once one is available, to complete the evaluation and treatment. I have discussed the plan of care with the patient and I have advised the patient that an ED physician or mid-level practitioner will reevaluate their condition after the test results have been received, as the results may give them additional insight into the type of treatment they may need.    Diagnostics: Labs, respiratory panel, abdominal CT  Treatments: none immediately   Teodoro Spray, PA 07/01/21 2353    Lucrezia Starch, MD 07/02/21 [phone]

## 2021-07-01 NOTE — Telephone Encounter (Signed)
Pt advised. She is going to go to Urgent care.   Thanks,   -Mickel Baas

## 2021-07-08 DIAGNOSIS — G894 Chronic pain syndrome: Secondary | ICD-10-CM | POA: Diagnosis not present

## 2021-07-08 DIAGNOSIS — M545 Low back pain, unspecified: Secondary | ICD-10-CM | POA: Diagnosis not present

## 2021-07-08 DIAGNOSIS — M542 Cervicalgia: Secondary | ICD-10-CM | POA: Diagnosis not present

## 2021-07-11 DIAGNOSIS — M3501 Sicca syndrome with keratoconjunctivitis: Secondary | ICD-10-CM | POA: Diagnosis not present

## 2021-07-16 ENCOUNTER — Encounter: Payer: Self-pay | Admitting: Oncology

## 2021-07-17 ENCOUNTER — Encounter: Payer: Self-pay | Admitting: Oncology

## 2021-07-17 ENCOUNTER — Ambulatory Visit
Admission: RE | Admit: 2021-07-17 | Discharge: 2021-07-17 | Disposition: A | Discharge status: Home / Self Care | Payer: Medicare (Managed Care) | Source: Ambulatory Visit | Attending: Oncology | Admitting: Oncology

## 2021-07-17 ENCOUNTER — Other Ambulatory Visit: Payer: Self-pay

## 2021-07-17 DIAGNOSIS — C189 Malignant neoplasm of colon, unspecified: Secondary | ICD-10-CM

## 2021-07-17 DIAGNOSIS — C184 Malignant neoplasm of transverse colon: Secondary | ICD-10-CM | POA: Diagnosis not present

## 2021-07-17 DIAGNOSIS — Z1231 Encounter for screening mammogram for malignant neoplasm of breast: Secondary | ICD-10-CM | POA: Insufficient documentation

## 2021-07-23 ENCOUNTER — Ambulatory Visit (INDEPENDENT_AMBULATORY_CARE_PROVIDER_SITE_OTHER): Payer: Medicare (Managed Care) | Admitting: Family Medicine

## 2021-07-23 ENCOUNTER — Encounter: Payer: Self-pay | Admitting: Oncology

## 2021-07-23 ENCOUNTER — Other Ambulatory Visit: Payer: Self-pay

## 2021-07-23 ENCOUNTER — Ambulatory Visit: Payer: Self-pay | Admitting: *Deleted

## 2021-07-23 VITALS — BP 168/81 | HR 92 | Temp 98.4°F

## 2021-07-23 DIAGNOSIS — K529 Noninfective gastroenteritis and colitis, unspecified: Secondary | ICD-10-CM

## 2021-07-23 MED ORDER — PROMETHAZINE HCL 25 MG PO TABS
25.0000 mg | ORAL_TABLET | Freq: Three times a day (TID) | ORAL | 0 refills | Status: DC | PRN
Start: 2021-07-23 — End: 2021-10-01

## 2021-07-23 NOTE — Progress Notes (Signed)
Established patient visit   Patient: Carrie [place]   DOB: [birth year]   71 y.o. Female  MRN: [id number] Visit Date: 07/23/2021  Today's healthcare provider: Lelon Huh, MD   No chief complaint on file.  Subjective    Diarrhea  This is a new problem. The current episode started yesterday. Associated symptoms include abdominal pain, chills, myalgias and vomiting. Pertinent negatives include no coughing, fever or headaches.   Symptoms started quickly. Only thing she had to eat yesterday was some mash potatoes from [facility]. Denies blood in stool and denies blood in vomit.. Abdominal pain is diffuse and crampy. Have a little bit of chills. She has not had any vomiting today, but very nauseated and only drank a few sips of water.   Patient complains of feeling lightheaded and was very off balance when brought through the office.  She is currently having chills and states she feel terrible. She is having shortness of breath with exertion that has just started since with these other symptoms.   Medications: Outpatient Medications Prior to Visit  Medication Sig   acetaminophen (TYLENOL) 500 MG tablet Take 500 mg by mouth every 6 (six) hours as needed for mild pain or moderate pain.    albuterol (VENTOLIN HFA) 108 (90 Base) MCG/ACT inhaler Inhale 2 puffs into the lungs every 6 (six) hours as needed for wheezing or shortness of breath.   amLODipine (NORVASC) 5 MG tablet Take 1 tablet (5 mg total) by mouth daily. OFFICE VISIT NEEDED FOR ADDITIONAL REFILLS   atorvastatin (LIPITOR) 10 MG tablet Take 1 tablet (10 mg total) by mouth every morning.   baclofen (LIORESAL) 10 MG tablet Take 1 tablet (10 mg total) by mouth 2 (two) times daily.   cetirizine (ZYRTEC) 10 MG tablet Take 10 mg by mouth daily at 6 (six) AM.   cholecalciferol (VITAMIN D) 1000 units tablet Take 1,000 Units by mouth daily.   fluticasone (FLONASE) 50 MCG/ACT nasal spray Use 2 spray(s) in each nostril once daily    gabapentin (NEURONTIN) 300 MG capsule Take 300-600 mg by mouth 4 (four) times daily as needed (neuropathic pain).    HYDROcodone-acetaminophen (NORCO) 5-325 MG tablet Take 1 tablet by mouth every 4 (four) hours as needed for moderate pain.   letrozole (FEMARA) 2.5 MG tablet Take 2.5 mg by mouth daily.   NARCAN 4 MG/0.1ML LIQD nasal spray kit Place 1 spray into the nose.    ondansetron (ZOFRAN) 4 MG tablet Take 1 tablet (4 mg total) by mouth every 8 (eight) hours as needed for up to 10 doses for nausea or vomiting.   Oxycodone HCl 10 MG TABS LIMIT ONE HALF TO ONE TABLET BY MOUTH 3 TO 5 TIMES DAILY IF TOLERATED   PREVIDENT 5000 DRY MOUTH 1.1 % GEL dental gel Place onto teeth at bedtime.   pyridoxine (B-6) 100 MG tablet Take 100 mg by mouth daily.   sertraline (ZOLOFT) 100 MG tablet Take 2 tablets by mouth once daily   vitamin B-12 (CYANOCOBALAMIN) 500 MCG tablet Take 500 mcg by mouth daily.   Facility-Administered Medications Prior to Visit  Medication Dose Route Frequency Provider   heparin lock flush 100 unit/mL  500 Units Intravenous Once [name], MD   heparin lock flush 100 unit/mL  500 Units Intravenous Once [name], MD   sodium chloride flush (NS) 0.9 % injection 10 mL  10 mL Intravenous PRN [name], MD    Review of Systems  Constitutional:  Positive for chills, diaphoresis and fatigue. Negative for fever.  HENT:  Positive for congestion, postnasal drip and rhinorrhea. Negative for sinus pressure, sinus pain, sneezing and sore throat.   Respiratory:  Positive for shortness of breath. Negative for cough.   Gastrointestinal:  Positive for abdominal distention, abdominal pain, diarrhea, nausea and vomiting. Negative for anal bleeding, blood in stool and constipation.  Musculoskeletal:  Positive for myalgias.  Neurological:  Positive for dizziness and light-headedness. Negative for headaches.      Objective    BP (!) 168/81 (BP Location: Right Arm,  Patient Position: Sitting, Cuff Size: Large)   Pulse 92   Temp 98.4 F (36.9 C) (Oral)   SpO2 100%  Vitals:   07/23/21 0831 07/23/21 0836  BP: 130/86 (!) 168/81  Pulse: 92   Temp: 98.4 F (36.9 C)   TempSrc: Oral   SpO2: 100%      Physical Exam    General: Appearance:    Mildly obese female in no acute distress. Generally weak. Required the use of an office wheelchair. (Does not usually require any mobility assistance.   Eyes:    PERRL, conjunctiva/corneas clear, EOM's intact       Lungs:     Clear to auscultation bilaterally, respirations unlabored  Heart:    Normal heart rate. Normal rhythm. No murmurs, rubs, or gallops.    MS:   All extremities are intact.    Neurologic:   Awake, alert, oriented x 3. No apparent focal neurological defect.   Abd:   Obese, no focal tenderness of masses.       Assessment & Plan     1. Gastroenteritis Generally weak, but not hypotensive, tachycardic or tachypnic. Counseled extensively on importance of keeping herself hydrated, mostly with water.  - promethazine (PHENERGAN) 25 MG tablet; Take 1 tablet (25 mg total) by mouth every 8 (eight) hours as needed for nausea or vomiting.  Dispense: 20 tablet; Refill: 0   Patient is having one of her children come pick her to take her home and advised not to drive until improving.   Advised to go to ER if unable to drink and keep down 8oz water every 1-2 hours.  She did have c.diff in November, but current symptoms are typical for viral GE. Call if diarrhea does not resolve in 2-days.          [place],acting as a scribe for Lelon Huh, MD.,have documented all relevant documentation on the behalf of Lelon Huh, MD,as directed by  Lelon Huh, MD while in the presence of Lelon Huh, MD.  The entirety of the information documented in the History of Present Illness, Review of Systems and Physical Exam were personally obtained by me. Portions of this information were initially  documented by the CMA and reviewed by me for thoroughness and accuracy.     Lelon Huh, MD  [facility] [phone] (phone) [phone] (fax)  [place]

## 2021-07-23 NOTE — Telephone Encounter (Signed)
Reason for Disposition  [1] MODERATE diarrhea (e.g., 4-6 times / day more than normal) AND [2] age > 70 years  Answer Assessment - Initial Assessment Questions 1. DIARRHEA SEVERITY: "How bad is the diarrhea?" "How many more stools have you had in the past 24 hours than normal?"    - NO DIARRHEA (SCALE 0)   - MILD (SCALE 1-3): Few loose or mushy BMs; increase of 1-3 stools over normal daily number of stools; mild increase in ostomy output.   -  MODERATE (SCALE 4-7): Increase of 4-6 stools daily over normal; moderate increase in ostomy output. * SEVERE (SCALE 8-10; OR 'WORST POSSIBLE'): Increase of 7 or more stools daily over normal; moderate increase in ostomy output; incontinence.     Pt c/o diarrhea and vomiting. 2. ONSET: "When did the diarrhea begin?"      Yesterday while I was at the laundry mat.   I vomited.   Diarrhea started yesterday too.   Has not taken any Pepto-Bismal or Immodium.   3. BM CONSISTENCY: "How loose or watery is the diarrhea?"      I'm up and down constantly 4. VOMITING: "Are you also vomiting?" If Yes, ask: "How many times in the past 24 hours?"      Yes 5. ABDOMINAL PAIN: "Are you having any abdominal pain?" If Yes, ask: "What does it feel like?" (e.g., crampy, dull, intermittent, constant)      Yes abd pain. 6. ABDOMINAL PAIN SEVERITY: If present, ask: "How bad is the pain?"  (e.g., Scale 1-10; mild, moderate, or severe)   - MILD (1-3): doesn't interfere with normal activities, abdomen soft and not tender to touch    - MODERATE (4-7): interferes with normal activities or awakens from sleep, abdomen tender to touch    - SEVERE (8-10): excruciating pain, doubled over, unable to do any normal activities       *No Answer* 7. ORAL INTAKE: If vomiting, "Have you been able to drink liquids?" "How much liquids have you had in the past 24 hours?"     *No Answer* 8. HYDRATION: "Any signs of dehydration?" (e.g., dry mouth [not just dry lips], too weak to stand, dizziness, new  weight loss) "When did you last urinate?"     *No Answer* 9. EXPOSURE: "Have you traveled to a foreign country recently?" "Have you been exposed to anyone with diarrhea?" "Could you have eaten any food that was spoiled?"     *No Answer* 10. ANTIBIOTIC USE: "Are you taking antibiotics now or have you taken antibiotics in the past 2 months?"       *No Answer* 11. OTHER SYMPTOMS: "Do you have any other symptoms?" (e.g., fever, blood in stool)       My sinuses are draining.   No headaches or fever.  No sore throat or coughing. 12. PREGNANCY: "Is there any chance you are pregnant?" "When was your last menstrual period?"       *No Answer*  Protocols used: [facility]

## 2021-07-23 NOTE — Patient Instructions (Signed)
.   Please review the attached list of medications and notify my office if there are any errors.   . Please bring all of your medications to every appointment so we can make sure that our medication list is the same as yours.   

## 2021-07-23 NOTE — Telephone Encounter (Signed)
  Chief Complaint: vomiting and diarrhea, sinus drainage Symptoms: abd pain, body aches Frequency: since yesterday Pertinent Negatives: Patient denies fever, sore throat.   Disposition: [] ED /[] Urgent Care (no appt availability in office) / [x] Appointment(In office/virtual)/ []  [place] [facility]/ [] Home Care/ [] Refused Recommended Disposition /[] [facility] [facility]/ []  Follow-up with PCP Additional Notes:

## 2021-08-09 ENCOUNTER — Other Ambulatory Visit: Payer: Self-pay | Admitting: Family Medicine

## 2021-08-09 DIAGNOSIS — I10 Essential (primary) hypertension: Secondary | ICD-10-CM

## 2021-08-09 NOTE — Telephone Encounter (Signed)
Requested medication (s) are due for refill today: yes  Requested medication (s) are on the active medication list: yes  Last refill:  05/13/21 #90  Future visit scheduled: yes in 3 days Virtual  Notes to clinic:  called pt and appt made- please advise if should have 3 day refill or wait until OV   Requested Prescriptions  Pending Prescriptions Disp Refills   amLODipine (NORVASC) 5 MG tablet [Pharmacy Med Name: amLODIPine Besylate 5 MG Oral Tablet] 90 tablet 0    Sig: TAKE 1 TABLET BY MOUTH ONCE DAILY. OFFICE VISIT NEEDED FOR FURTHER REFILLS.     Cardiovascular:  Calcium Channel Blockers Failed - 08/09/2021  9:28 AM      Failed - Last BP in normal range    BP Readings from Last 1 Encounters:  07/23/21 (!) 168/81          Passed - Valid encounter within last 6 months    Recent Outpatient Visits           2 weeks ago [place], Donald E, MD   2 months ago Diarrhea of presumed infectious origin   [place], Kirstie Peri, MD   5 months ago Essential hypertension   [place], Vickki Muff, PA-C   8 months ago Annual physical exam   [place], PA-C   9 months ago Nausea without vomiting   [employer] Just, Laurita Quint, FNP       Future Appointments             In 3 days Gwyneth Sprout, [place], [place]

## 2021-08-12 ENCOUNTER — Encounter: Payer: Self-pay | Admitting: Family Medicine

## 2021-08-12 ENCOUNTER — Telehealth (INDEPENDENT_AMBULATORY_CARE_PROVIDER_SITE_OTHER): Payer: Medicare (Managed Care) | Admitting: Family Medicine

## 2021-08-12 ENCOUNTER — Other Ambulatory Visit: Payer: Self-pay

## 2021-08-12 VITALS — BP 134/95 | Wt 200.0 lb

## 2021-08-12 DIAGNOSIS — I10 Essential (primary) hypertension: Secondary | ICD-10-CM | POA: Diagnosis not present

## 2021-08-12 DIAGNOSIS — G629 Polyneuropathy, unspecified: Secondary | ICD-10-CM | POA: Diagnosis not present

## 2021-08-12 DIAGNOSIS — I700 Atherosclerosis of aorta: Secondary | ICD-10-CM | POA: Diagnosis not present

## 2021-08-12 DIAGNOSIS — Z131 Encounter for screening for diabetes mellitus: Secondary | ICD-10-CM | POA: Diagnosis not present

## 2021-08-12 MED ORDER — AMLODIPINE BESYLATE 5 MG PO TABS: 5.0000 mg | ORAL_TABLET | Freq: Every day | ORAL | 3 refills | Status: DC

## 2021-08-12 MED ORDER — ATORVASTATIN CALCIUM 80 MG PO TABS: 80.0000 mg | ORAL_TABLET | Freq: Every day | ORAL | 3 refills | Status: DC

## 2021-08-12 NOTE — Assessment & Plan Note (Signed)
Recommend increase in statin given aortic atherosclerosis; new dose sent Encouraged to get lipid panel; order placed

## 2021-08-12 NOTE — Patient Instructions (Signed)
Recommend in visit 1 month follow up for BP check and further neuropathy w/u.  Labs ordered for in office or [facility]

## 2021-08-12 NOTE — Progress Notes (Signed)
MyChart Video Visit    Virtual Visit via Video Note   This visit type was conducted due to national recommendations for restrictions regarding the COVID-19 Pandemic (e.g. social distancing) in an effort to limit this patient's exposure and mitigate transmission in our community. This patient is at least at moderate risk for complications without adequate follow up. This format is felt to be most appropriate for this patient at this time. Physical exam was limited by quality of the video and audio technology used for the visit.   Patient location: home Provider location: [facility]  I discussed the limitations of evaluation and management by telemedicine and the availability of in person appointments. The patient expressed understanding and agreed to proceed.  Patient: Carrie [place]   DOB: May 10, 1951   71 y.o. Female  MRN: [id number] Visit Date: 08/12/2021  Today's healthcare provider: Gwyneth Sprout, FNP   No chief complaint on file.  Subjective    HPI  .Hypertension, follow-up  BP Readings from Last 3 Encounters:  08/12/21 (!) 134/95  07/23/21 (!) 168/81  07/01/21 (!) 140/98   Wt Readings from Last 3 Encounters:  08/12/21 200 lb (90.7 kg)  07/01/21 200 lb (90.7 kg)  06/10/21 200 lb (90.7 kg)     She was last seen for hypertension 5 months ago.  BP at that visit was 117/87. Management since that visit includes none.  She reports excellent compliance with treatment. She is not having side effects.  She is following a Regular diet. She is exercising. She does not smoke.  Use of agents associated with hypertension: none.   Outside blood pressures are not checked. Symptoms: No chest pain No chest pressure  No palpitations No syncope  No dyspnea No orthopnea  No paroxysmal nocturnal dyspnea No lower extremity edema   Pertinent labs: Lab Results  Component Value Date   CHOL 123 11/21/2020   HDL 61 11/21/2020   LDLCALC 51 11/21/2020   TRIG 48 11/21/2020    CHOLHDL 2.0 11/21/2020   Lab Results  Component Value Date   NA 138 07/01/2021   K 3.5 07/01/2021   CREATININE 0.82 07/01/2021   GFRNONAA >60 07/01/2021   GLUCOSE 94 07/01/2021   TSH 1.290 11/21/2020     The ASCVD Risk score (Arnett DK, et al., 2019) failed to calculate for the following reasons:   The valid total cholesterol range is 130 to 320 mg/dL   --------------------------------------------------------------------------------------------------- Lipid/Cholesterol, Follow-up  Last lipid panel Other pertinent labs  Lab Results  Component Value Date   CHOL 123 11/21/2020   HDL 61 11/21/2020   LDLCALC 51 11/21/2020   TRIG 48 11/21/2020   CHOLHDL 2.0 11/21/2020   Lab Results  Component Value Date   ALT 15 07/01/2021   AST 20 07/01/2021   PLT 289 07/01/2021   TSH 1.290 11/21/2020     She was last seen for this 5 months ago.  Management since that visit includes none.  She reports excellent compliance with treatment. She is not having side effects.   Symptoms: No chest pain No chest pressure/discomfort  No dyspnea No lower extremity edema  No numbness or tingling of extremity No orthopnea  No palpitations No paroxysmal nocturnal dyspnea  No speech difficulty No syncope   Current diet: well balanced Current exercise: walking  The ASCVD Risk score (Arnett DK, et al., 2019) failed to calculate for the following reasons:   The valid total cholesterol range is 130 to 320 mg/dL  ---------------------------------------------------------------------------------------------------  Medications: Outpatient Medications Prior to Visit  Medication Sig   acetaminophen (TYLENOL) 500 MG tablet Take 500 mg by mouth every 6 (six) hours as needed for mild pain or moderate pain.    albuterol (VENTOLIN HFA) 108 (90 Base) MCG/ACT inhaler Inhale 2 puffs into the lungs every 6 (six) hours as needed for wheezing or shortness of breath.   baclofen (LIORESAL) 10 MG tablet Take 1  tablet (10 mg total) by mouth 2 (two) times daily.   cetirizine (ZYRTEC) 10 MG tablet Take 10 mg by mouth daily at 6 (six) AM.   cholecalciferol (VITAMIN D) 1000 units tablet Take 1,000 Units by mouth daily.   fluticasone (FLONASE) 50 MCG/ACT nasal spray Use 2 spray(s) in each nostril once daily   gabapentin (NEURONTIN) 300 MG capsule Take 300-600 mg by mouth 4 (four) times daily as needed (neuropathic pain).    HYDROcodone-acetaminophen (NORCO) 5-325 MG tablet Take 1 tablet by mouth every 4 (four) hours as needed for moderate pain.   letrozole (FEMARA) 2.5 MG tablet Take 2.5 mg by mouth daily.   NARCAN 4 MG/0.1ML LIQD nasal spray kit Place 1 spray into the nose.    ondansetron (ZOFRAN) 4 MG tablet Take 1 tablet (4 mg total) by mouth every 8 (eight) hours as needed for up to 10 doses for nausea or vomiting.   Oxycodone HCl 10 MG TABS LIMIT ONE HALF TO ONE TABLET BY MOUTH 3 TO 5 TIMES DAILY IF TOLERATED   PREVIDENT 5000 DRY MOUTH 1.1 % GEL dental gel Place onto teeth at bedtime.   promethazine (PHENERGAN) 25 MG tablet Take 1 tablet (25 mg total) by mouth every 8 (eight) hours as needed for nausea or vomiting.   pyridoxine (B-6) 100 MG tablet Take 100 mg by mouth daily.   sertraline (ZOLOFT) 100 MG tablet Take 2 tablets by mouth once daily   vitamin B-12 (CYANOCOBALAMIN) 500 MCG tablet Take 500 mcg by mouth daily.   [DISCONTINUED] amLODipine (NORVASC) 5 MG tablet TAKE 1 TABLET BY MOUTH ONCE DAILY. OFFICE VISIT NEEDED FOR FURTHER REFILLS.   [DISCONTINUED] atorvastatin (LIPITOR) 10 MG tablet Take 1 tablet (10 mg total) by mouth every morning.   Facility-Administered Medications Prior to Visit  Medication Dose Route Frequency Provider   heparin lock flush 100 unit/mL  500 Units Intravenous Once [name], MD   heparin lock flush 100 unit/mL  500 Units Intravenous Once [place], Kathlene November, MD   sodium chloride flush (NS) 0.9 % injection 10 mL  10 mL Intravenous PRN [name], MD     Review of Systems    Objective    BP (!) 134/95 Comment: home  Wt 200 lb (90.7 kg) Comment: self report  BMI 35.43 kg/m    Physical Exam Pulmonary:     Effort: Pulmonary effort is normal.  Psychiatric:        Mood and Affect: Mood normal.        Behavior: Behavior normal.        Thought Content: Thought content normal.        Judgment: Judgment normal.       Assessment & Plan     Problem List Items Addressed This Visit       Cardiovascular and Mediastinum   Hypertension - Primary    Reported BP 134/95 today Pt advised to increase water intake- reports 32 oz of water per day plus 16 oz of tea per day Encouraged to increase to minimum of 48 oz per  day to assist with DBP elevation Recommend 1 month office f/u  Denies LE edema endorses SOB- reports use of inhaler once/week, when rushing around  Reports 200 bs- reports purposeful weight loss, attempting to walk more for exercise       Relevant Medications   atorvastatin (LIPITOR) 80 MG tablet   amLODipine (NORVASC) 5 MG tablet   Aortic atherosclerosis (HCC)    Recommend increase in statin given aortic atherosclerosis; new dose sent Encouraged to get lipid panel; order placed      Relevant Medications   atorvastatin (LIPITOR) 80 MG tablet   amLODipine (NORVASC) 5 MG tablet   Other Relevant Orders   Lipid panel     Nervous and Auditory   Neuropathy    Reports neuropathy in all 4 limbs Check a1c Encourage in office w/u- pt in agreement       Relevant Orders   Hemoglobin A1c     Other   Diabetes mellitus screening    Hx of HTN and AA- check A1c given complaints of neuropathy  Hx of back pain      Relevant Orders   Hemoglobin A1c     Return in about 4 weeks (around 09/09/2021) for blood pressure check.     I discussed the assessment and treatment plan with the patient. The patient was provided an opportunity to ask questions and all were answered. The patient agreed with the plan and  demonstrated an understanding of the instructions.   The patient was advised to call back or seek an in-person evaluation if the symptoms worsen or if the condition fails to improve as anticipated.  I provided 11 minutes of non-face-to-face time during this encounter.  Vonna Kotyk, FNP, have reviewed all documentation for this visit. The documentation on 08/12/21 for the exam, diagnosis, procedures, and orders are all accurate and complete.  Patient seen and examined by Tally Joe,  FNP note scribed by [employer], [place], [place] [phone] (phone) [phone] (fax)  [place]

## 2021-08-12 NOTE — Assessment & Plan Note (Signed)
Reported BP 134/95 today Pt advised to increase water intake- reports 32 oz of water per day plus 16 oz of tea per day Encouraged to increase to minimum of 48 oz per day to assist with DBP elevation Recommend 1 month office f/u  Denies LE edema endorses SOB- reports use of inhaler once/week, when rushing around  Reports 200 bs- reports purposeful weight loss, attempting to walk more for exercise

## 2021-08-12 NOTE — Assessment & Plan Note (Signed)
Reports neuropathy in all 4 limbs Check a1c Encourage in office w/u- pt in agreement

## 2021-08-12 NOTE — Assessment & Plan Note (Signed)
Hx of HTN and AA- check A1c given complaints of neuropathy  Hx of back pain

## 2021-08-13 DIAGNOSIS — Z131 Encounter for screening for diabetes mellitus: Secondary | ICD-10-CM | POA: Diagnosis not present

## 2021-08-13 DIAGNOSIS — I700 Atherosclerosis of aorta: Secondary | ICD-10-CM | POA: Diagnosis not present

## 2021-08-13 DIAGNOSIS — G629 Polyneuropathy, unspecified: Secondary | ICD-10-CM | POA: Diagnosis not present

## 2021-08-14 LAB — LIPID PANEL
Chol/HDL Ratio: 1.9 ratio (ref 0.0–4.4)
Cholesterol, Total: 127 mg/dL (ref 100–199)
HDL: 66 mg/dL (ref >39)
LDL Chol Calc (NIH): 48 mg/dL (ref 0–99)
Triglycerides: 59 mg/dL (ref 0–149)
VLDL Cholesterol Cal: 13 mg/dL (ref 5–40)

## 2021-08-14 LAB — HEMOGLOBIN A1C
Est. average glucose Bld gHb Est-mCnc: 85 mg/dL
Hgb A1c MFr Bld: 4.6 % — ABNORMAL LOW (ref 4.8–5.6)

## 2021-09-16 DIAGNOSIS — M48062 Spinal stenosis, lumbar region with neurogenic claudication: Secondary | ICD-10-CM | POA: Diagnosis not present

## 2021-09-16 DIAGNOSIS — M9943 Connective tissue stenosis of neural canal of lumbar region: Secondary | ICD-10-CM | POA: Diagnosis not present

## 2021-09-16 DIAGNOSIS — R519 Headache, unspecified: Secondary | ICD-10-CM | POA: Diagnosis not present

## 2021-09-16 DIAGNOSIS — G8929 Other chronic pain: Secondary | ICD-10-CM | POA: Diagnosis not present

## 2021-09-16 DIAGNOSIS — M47897 Other spondylosis, lumbosacral region: Secondary | ICD-10-CM | POA: Diagnosis not present

## 2021-09-16 DIAGNOSIS — M792 Neuralgia and neuritis, unspecified: Secondary | ICD-10-CM | POA: Diagnosis not present

## 2021-09-16 DIAGNOSIS — M25519 Pain in unspecified shoulder: Secondary | ICD-10-CM | POA: Diagnosis not present

## 2021-09-16 DIAGNOSIS — M25559 Pain in unspecified hip: Secondary | ICD-10-CM | POA: Diagnosis not present

## 2021-09-22 ENCOUNTER — Other Ambulatory Visit: Payer: Self-pay | Admitting: *Deleted

## 2021-09-22 DIAGNOSIS — C184 Malignant neoplasm of transverse colon: Secondary | ICD-10-CM

## 2021-09-23 ENCOUNTER — Other Ambulatory Visit: Payer: Self-pay

## 2021-09-23 ENCOUNTER — Ambulatory Visit
Admission: RE | Admit: 2021-09-23 | Discharge: 2021-09-23 | Disposition: A | Discharge status: Home / Self Care | Payer: Medicare (Managed Care) | Source: Ambulatory Visit | Attending: Oncology | Admitting: Oncology

## 2021-09-23 DIAGNOSIS — K449 Diaphragmatic hernia without obstruction or gangrene: Secondary | ICD-10-CM | POA: Diagnosis not present

## 2021-09-23 DIAGNOSIS — C184 Malignant neoplasm of transverse colon: Secondary | ICD-10-CM | POA: Diagnosis not present

## 2021-09-23 DIAGNOSIS — K5900 Constipation, unspecified: Secondary | ICD-10-CM | POA: Diagnosis not present

## 2021-09-23 DIAGNOSIS — C189 Malignant neoplasm of colon, unspecified: Secondary | ICD-10-CM | POA: Diagnosis not present

## 2021-09-23 DIAGNOSIS — C19 Malignant neoplasm of rectosigmoid junction: Secondary | ICD-10-CM | POA: Diagnosis not present

## 2021-09-23 DIAGNOSIS — K5730 Diverticulosis of large intestine without perforation or abscess without bleeding: Secondary | ICD-10-CM | POA: Diagnosis not present

## 2021-09-23 DIAGNOSIS — J929 Pleural plaque without asbestos: Secondary | ICD-10-CM | POA: Diagnosis not present

## 2021-09-23 LAB — POCT I-STAT CREATININE: Creatinine, Ser: 1.2 mg/dL — ABNORMAL HIGH (ref 0.44–1.00)

## 2021-09-23 MED ORDER — IOHEXOL 300 MG/ML  SOLN
100.0000 mL | Freq: Once | INTRAMUSCULAR | Status: AC | PRN
  Administered 2021-09-23: 100 mL via INTRAVENOUS

## 2021-09-28 ENCOUNTER — Emergency Department: Payer: Medicare (Managed Care)

## 2021-09-28 ENCOUNTER — Inpatient Hospital Stay
Admission: EM | Admit: 2021-09-28 | Discharge: 2021-10-01 | DRG: 872 | Disposition: A | Discharge status: Home / Self Care | Payer: Medicare (Managed Care) | Attending: Internal Medicine | Admitting: Internal Medicine

## 2021-09-28 ENCOUNTER — Other Ambulatory Visit: Payer: Self-pay

## 2021-09-28 DIAGNOSIS — M1990 Unspecified osteoarthritis, unspecified site: Secondary | ICD-10-CM | POA: Diagnosis present

## 2021-09-28 DIAGNOSIS — R100 Acute abdomen: Secondary | ICD-10-CM | POA: Diagnosis not present

## 2021-09-28 DIAGNOSIS — Z825 Family history of asthma and other chronic lower respiratory diseases: Secondary | ICD-10-CM

## 2021-09-28 DIAGNOSIS — R109 Unspecified abdominal pain: Secondary | ICD-10-CM | POA: Insufficient documentation

## 2021-09-28 DIAGNOSIS — A4189 Other specified sepsis: Secondary | ICD-10-CM | POA: Diagnosis present

## 2021-09-28 DIAGNOSIS — I10 Essential (primary) hypertension: Secondary | ICD-10-CM | POA: Diagnosis present

## 2021-09-28 DIAGNOSIS — K219 Gastro-esophageal reflux disease without esophagitis: Secondary | ICD-10-CM | POA: Diagnosis present

## 2021-09-28 DIAGNOSIS — G8929 Other chronic pain: Secondary | ICD-10-CM | POA: Diagnosis present

## 2021-09-28 DIAGNOSIS — D649 Anemia, unspecified: Secondary | ICD-10-CM | POA: Diagnosis present

## 2021-09-28 DIAGNOSIS — Z79899 Other long term (current) drug therapy: Secondary | ICD-10-CM | POA: Diagnosis not present

## 2021-09-28 DIAGNOSIS — Z87891 Personal history of nicotine dependence: Secondary | ICD-10-CM | POA: Diagnosis not present

## 2021-09-28 DIAGNOSIS — K529 Noninfective gastroenteritis and colitis, unspecified: Secondary | ICD-10-CM

## 2021-09-28 DIAGNOSIS — Z20822 Contact with and (suspected) exposure to covid-19: Secondary | ICD-10-CM | POA: Diagnosis present

## 2021-09-28 DIAGNOSIS — Z853 Personal history of malignant neoplasm of breast: Secondary | ICD-10-CM

## 2021-09-28 DIAGNOSIS — E669 Obesity, unspecified: Secondary | ICD-10-CM | POA: Diagnosis present

## 2021-09-28 DIAGNOSIS — Z803 Family history of malignant neoplasm of breast: Secondary | ICD-10-CM | POA: Diagnosis not present

## 2021-09-28 DIAGNOSIS — F32A Depression, unspecified: Secondary | ICD-10-CM | POA: Diagnosis present

## 2021-09-28 DIAGNOSIS — Z6835 Body mass index (BMI) 35.0-35.9, adult: Secondary | ICD-10-CM | POA: Diagnosis not present

## 2021-09-28 DIAGNOSIS — R112 Nausea with vomiting, unspecified: Secondary | ICD-10-CM | POA: Diagnosis present

## 2021-09-28 DIAGNOSIS — Z8542 Personal history of malignant neoplasm of other parts of uterus: Secondary | ICD-10-CM

## 2021-09-28 DIAGNOSIS — R197 Diarrhea, unspecified: Secondary | ICD-10-CM | POA: Diagnosis not present

## 2021-09-28 DIAGNOSIS — Z85038 Personal history of other malignant neoplasm of large intestine: Secondary | ICD-10-CM

## 2021-09-28 DIAGNOSIS — E7800 Pure hypercholesterolemia, unspecified: Secondary | ICD-10-CM | POA: Diagnosis present

## 2021-09-28 DIAGNOSIS — Z8249 Family history of ischemic heart disease and other diseases of the circulatory system: Secondary | ICD-10-CM

## 2021-09-28 DIAGNOSIS — R000 Tachycardia, unspecified: Secondary | ICD-10-CM | POA: Diagnosis not present

## 2021-09-28 DIAGNOSIS — R1084 Generalized abdominal pain: Secondary | ICD-10-CM | POA: Diagnosis not present

## 2021-09-28 DIAGNOSIS — Z9221 Personal history of antineoplastic chemotherapy: Secondary | ICD-10-CM | POA: Diagnosis not present

## 2021-09-28 DIAGNOSIS — C189 Malignant neoplasm of colon, unspecified: Secondary | ICD-10-CM | POA: Diagnosis present

## 2021-09-28 DIAGNOSIS — A0811 Acute gastroenteropathy due to Norwalk agent: Secondary | ICD-10-CM | POA: Diagnosis present

## 2021-09-28 DIAGNOSIS — N179 Acute kidney failure, unspecified: Secondary | ICD-10-CM | POA: Diagnosis present

## 2021-09-28 DIAGNOSIS — Z800 Family history of malignant neoplasm of digestive organs: Secondary | ICD-10-CM

## 2021-09-28 DIAGNOSIS — A419 Sepsis, unspecified organism: Secondary | ICD-10-CM | POA: Diagnosis present

## 2021-09-28 LAB — CBC
HCT: 38 % (ref 36.0–46.0)
Hemoglobin: 12.9 g/dL (ref 12.0–15.0)
MCH: 28.1 pg (ref 26.0–34.0)
MCHC: 33.9 g/dL (ref 30.0–36.0)
MCV: 82.8 fL (ref 80.0–100.0)
Platelets: 233 10*3/uL (ref 150–400)
RBC: 4.59 MIL/uL (ref 3.87–5.11)
RDW: 13.6 % (ref 11.5–15.5)
WBC: 13.4 10*3/uL — ABNORMAL HIGH (ref 4.0–10.5)
nRBC: 0 % (ref 0.0–0.2)

## 2021-09-28 LAB — COMPREHENSIVE METABOLIC PANEL
ALT: 18 U/L (ref 0–44)
AST: 25 U/L (ref 15–41)
Albumin: 4.1 g/dL (ref 3.5–5.0)
Alkaline Phosphatase: 75 U/L (ref 38–126)
Anion gap: 9 (ref 5–15)
BUN: 15 mg/dL (ref 8–23)
CO2: 23 mmol/L (ref 22–32)
Calcium: 9.7 mg/dL (ref 8.9–10.3)
Chloride: 108 mmol/L (ref 98–111)
Creatinine, Ser: 1.11 mg/dL — ABNORMAL HIGH (ref 0.44–1.00)
GFR, Estimated: 53 mL/min — ABNORMAL LOW (ref >60)
Glucose, Bld: 113 mg/dL — ABNORMAL HIGH (ref 70–99)
Potassium: 3.6 mmol/L (ref 3.5–5.1)
Sodium: 140 mmol/L (ref 135–145)
Total Bilirubin: 1.2 mg/dL (ref 0.3–1.2)
Total Protein: 7.7 g/dL (ref 6.5–8.1)

## 2021-09-28 LAB — LIPASE, BLOOD: Lipase: 26 U/L (ref 11–51)

## 2021-09-28 LAB — RESP PANEL BY RT-PCR (FLU A&B, COVID) ARPGX2
Influenza A by PCR: NEGATIVE
Influenza B by PCR: NEGATIVE
SARS Coronavirus 2 by RT PCR: NEGATIVE

## 2021-09-28 LAB — GLUCOSE, CAPILLARY: Glucose-Capillary: 94 mg/dL (ref 70–99)

## 2021-09-28 LAB — PROCALCITONIN: Procalcitonin: 0.27 ng/mL

## 2021-09-28 MED ORDER — ALBUTEROL SULFATE (2.5 MG/3ML) 0.083% IN NEBU: 2.5000 mg | INHALATION_SOLUTION | Freq: Four times a day (QID) | RESPIRATORY_TRACT | Status: DC | PRN

## 2021-09-28 MED ORDER — HYDROMORPHONE HCL 1 MG/ML IJ SOLN
0.5000 mg | Freq: Once | INTRAMUSCULAR | Status: AC
  Administered 2021-09-28: 0.5 mg via INTRAVENOUS
  Filled 2021-09-28: qty 1

## 2021-09-28 MED ORDER — SERTRALINE HCL 50 MG PO TABS
200.0000 mg | ORAL_TABLET | Freq: Every day | ORAL | Status: DC
  Administered 2021-09-28 – 2021-10-01 (×4): 200 mg via ORAL
  Filled 2021-09-28 (×4): qty 4

## 2021-09-28 MED ORDER — ACETAMINOPHEN 500 MG PO TABS: 500.0000 mg | ORAL_TABLET | Freq: Four times a day (QID) | ORAL | Status: DC | PRN

## 2021-09-28 MED ORDER — FLUTICASONE PROPIONATE 50 MCG/ACT NA SUSP
1.0000 | Freq: Every day | NASAL | Status: DC
  Administered 2021-09-29 – 2021-10-01 (×3): 1 via NASAL
  Filled 2021-09-28: qty 16

## 2021-09-28 MED ORDER — BACLOFEN 10 MG PO TABS
10.0000 mg | ORAL_TABLET | Freq: Two times a day (BID) | ORAL | Status: DC
  Administered 2021-09-28 – 2021-10-01 (×6): 10 mg via ORAL
  Filled 2021-09-28 (×6): qty 1

## 2021-09-28 MED ORDER — SODIUM FLUORIDE 1.1 % DT GEL: Freq: Every day | DENTAL | Status: DC

## 2021-09-28 MED ORDER — PROMETHAZINE HCL 25 MG PO TABS
25.0000 mg | ORAL_TABLET | Freq: Three times a day (TID) | ORAL | Status: DC | PRN
  Administered 2021-09-29: 25 mg via ORAL
  Filled 2021-09-28 (×2): qty 1

## 2021-09-28 MED ORDER — IOHEXOL 300 MG/ML  SOLN
100.0000 mL | Freq: Once | INTRAMUSCULAR | Status: AC | PRN
  Administered 2021-09-28: 100 mL via INTRAVENOUS

## 2021-09-28 MED ORDER — AMLODIPINE BESYLATE 5 MG PO TABS
5.0000 mg | ORAL_TABLET | Freq: Every day | ORAL | Status: DC
  Administered 2021-09-28 – 2021-10-01 (×4): 5 mg via ORAL
  Filled 2021-09-28 (×4): qty 1

## 2021-09-28 MED ORDER — OXYCODONE HCL 5 MG PO TABS
5.0000 mg | ORAL_TABLET | Freq: Four times a day (QID) | ORAL | Status: DC | PRN
  Administered 2021-09-28: 5 mg via ORAL
  Filled 2021-09-28: qty 1

## 2021-09-28 MED ORDER — VITAMIN B-6 50 MG PO TABS
100.0000 mg | ORAL_TABLET | Freq: Every day | ORAL | Status: DC
  Administered 2021-09-29 – 2021-10-01 (×3): 100 mg via ORAL
  Filled 2021-09-28 (×4): qty 2

## 2021-09-28 MED ORDER — ASPIRIN EC 81 MG PO TBEC
81.0000 mg | DELAYED_RELEASE_TABLET | Freq: Every day | ORAL | Status: DC
  Administered 2021-09-28 – 2021-10-01 (×4): 81 mg via ORAL
  Filled 2021-09-28 (×4): qty 1

## 2021-09-28 MED ORDER — VITAMIN D 25 MCG (1000 UNIT) PO TABS
1000.0000 [IU] | ORAL_TABLET | Freq: Every day | ORAL | Status: DC
  Administered 2021-09-29 – 2021-10-01 (×3): 1000 [IU] via ORAL
  Filled 2021-09-28 (×3): qty 1

## 2021-09-28 MED ORDER — LETROZOLE 2.5 MG PO TABS
2.5000 mg | ORAL_TABLET | Freq: Every day | ORAL | Status: DC
  Administered 2021-09-29 – 2021-10-01 (×3): 2.5 mg via ORAL
  Filled 2021-09-28 (×4): qty 1

## 2021-09-28 MED ORDER — SODIUM CHLORIDE 0.9 % IV SOLN: INTRAVENOUS | Status: DC

## 2021-09-28 MED ORDER — ATORVASTATIN CALCIUM 20 MG PO TABS
80.0000 mg | ORAL_TABLET | Freq: Every day | ORAL | Status: DC
  Administered 2021-09-28 – 2021-10-01 (×4): 80 mg via ORAL
  Filled 2021-09-28 (×4): qty 4

## 2021-09-28 MED ORDER — HEPARIN SODIUM (PORCINE) 5000 UNIT/ML IJ SOLN
5000.0000 [IU] | Freq: Three times a day (TID) | INTRAMUSCULAR | Status: DC
  Administered 2021-09-28 – 2021-09-30 (×7): 5000 [IU] via SUBCUTANEOUS
  Filled 2021-09-28 (×7): qty 1

## 2021-09-28 MED ORDER — GABAPENTIN 300 MG PO CAPS
600.0000 mg | ORAL_CAPSULE | Freq: Three times a day (TID) | ORAL | Status: DC
  Administered 2021-09-28 – 2021-10-01 (×8): 600 mg via ORAL
  Filled 2021-09-28 (×8): qty 2

## 2021-09-28 MED ORDER — SODIUM CHLORIDE 0.9 % IV BOLUS
1000.0000 mL | Freq: Once | INTRAVENOUS | Status: AC
  Administered 2021-09-28: 1000 mL via INTRAVENOUS

## 2021-09-28 MED ORDER — ONDANSETRON HCL 4 MG/2ML IJ SOLN
4.0000 mg | Freq: Once | INTRAMUSCULAR | Status: AC
  Administered 2021-09-28: 4 mg via INTRAVENOUS
  Filled 2021-09-28: qty 2

## 2021-09-28 MED ORDER — PANTOPRAZOLE SODIUM 40 MG IV SOLR
40.0000 mg | Freq: Two times a day (BID) | INTRAVENOUS | Status: DC
  Administered 2021-09-28 – 2021-10-01 (×6): 40 mg via INTRAVENOUS
  Filled 2021-09-28 (×6): qty 10

## 2021-09-28 MED ORDER — MORPHINE SULFATE (PF) 4 MG/ML IV SOLN
4.0000 mg | Freq: Once | INTRAVENOUS | Status: AC
  Administered 2021-09-28: 4 mg via INTRAVENOUS
  Filled 2021-09-28: qty 1

## 2021-09-28 MED ORDER — CYANOCOBALAMIN 500 MCG PO TABS
500.0000 ug | ORAL_TABLET | Freq: Every day | ORAL | Status: DC
  Administered 2021-09-28 – 2021-10-01 (×4): 500 ug via ORAL
  Filled 2021-09-28 (×4): qty 1

## 2021-09-28 MED ORDER — DICYCLOMINE HCL 10 MG PO CAPS
10.0000 mg | ORAL_CAPSULE | Freq: Once | ORAL | Status: AC
  Administered 2021-09-28: 10 mg via ORAL
  Filled 2021-09-28: qty 1

## 2021-09-28 MED ORDER — METOCLOPRAMIDE HCL 5 MG/ML IJ SOLN
10.0000 mg | Freq: Once | INTRAMUSCULAR | Status: AC
  Administered 2021-09-28: 10 mg via INTRAVENOUS
  Filled 2021-09-28: qty 2

## 2021-09-28 MED ORDER — ONDANSETRON HCL 4 MG PO TABS: 4.0000 mg | ORAL_TABLET | Freq: Three times a day (TID) | ORAL | Status: DC | PRN

## 2021-09-28 MED ORDER — HYDROCODONE-ACETAMINOPHEN 5-325 MG PO TABS
1.0000 | ORAL_TABLET | ORAL | Status: DC | PRN
  Administered 2021-09-29 – 2021-09-30 (×2): 1 via ORAL
  Filled 2021-09-28 (×2): qty 1

## 2021-09-28 MED ORDER — LORATADINE 10 MG PO TABS
10.0000 mg | ORAL_TABLET | Freq: Every day | ORAL | Status: DC
  Administered 2021-09-28 – 2021-10-01 (×4): 10 mg via ORAL
  Filled 2021-09-28 (×4): qty 1

## 2021-09-28 MED ORDER — INSULIN ASPART 100 UNIT/ML IJ SOLN: 0.0000 [IU] | Freq: Three times a day (TID) | INTRAMUSCULAR | Status: DC

## 2021-09-28 NOTE — Progress Notes (Deleted)
[place]  [phone] [phone]  ID: Carrie Alvarez OB: [birth year]  MR#: [id number]  VZD#:[id number]  Patient Care Team: Birdie Sons, MD as PCP - General (Family Medicine) [name], MD as Consulting Physician (Oncology) [name], MD as Attending Physician (Pain Medicine) [name]., MD as Consulting Physician (Ophthalmology) [name], RN as Oncology Nurse Navigator [name], [name], MD as Referring Physician (Anesthesiology)  CHIEF COMPLAINT: Stage IIa ER+ left breast cancer with no breast lesion and axillary lymph node metastasis.  Now with stage IIIa colon cancer.  INTERVAL HISTORY: Patient returns to clinic today for routine 36-monthevaluation and discussion of her imaging results.  She had a fall approximately 1 week ago.  She did not sustain any injuries, but has significant soreness and flank tenderness since that time.  She otherwise feels well.  She does not complain of back or leg pain today.  Her peripheral neuropathy is chronic and unchanged.  She has no other neurologic complaints.  She denies any weakness or fatigue. She denies any recent fevers or illnesses.  She has no chest pain, shortness of breath, cough, or hemoptysis.  She denies any nausea, vomiting, constipation, or diarrhea. She has no melena or hematochezia.  She has no urinary complaints.  Patient offers no further specific complaints today.  REVIEW OF SYSTEMS:   Review of Systems  Constitutional: Negative.  Negative for fever, malaise/fatigue and weight loss.  Respiratory: Negative.  Negative for cough and shortness of breath.   Cardiovascular: Negative.  Negative for chest pain and leg swelling.  Gastrointestinal: Negative.  Negative for abdominal pain, blood in stool, constipation, diarrhea, melena, nausea and vomiting.  Genitourinary: Negative.  Negative for dysuria.  Musculoskeletal:  Positive for falls. Negative for  back pain and neck pain.  Skin: Negative.  Negative for rash.  Neurological:  Positive for tingling and sensory change. Negative for dizziness, focal weakness, weakness and headaches.  Psychiatric/Behavioral: Negative.  Negative for depression. The patient is not nervous/anxious.    As per HPI. Otherwise, a complete review of systems is negative.  PAST MEDICAL HISTORY: Past Medical History:  Diagnosis Date   Allergy    Anemia    Arthritis    Back pain    Breast [place]   LT LUMPECTOMY 12-2014 FOLLOWING CHEMO   Carpal tunnel syndrome    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   Cataract    Chronic pain    Colon cancer ([facility] 01[place]2019   Depression    Dyspnea    Family history of breast cancer    Family history of colon cancer    GERD (gastroesophageal reflux disease)    problem with reflux during chemo   History of methicillin resistant staphylococcus aureus (MRSA)    Hypercholesteremia    Hypertension    Lumbosacral pain    sees pain management in [place]   Obesity    Personal history of chemotherapy 2016   left breast ca   Personal history of chemotherapy 2020   colon ca   Pinched nerve    left side, lumbar area   [place]    PAST SURGICAL HISTORY: Past Surgical History:  Procedure Laterality Date   ABDOMINAL HYSTERECTOMY  1994   UTERINE CA   AXILLARY LYMPH NODE BIOPSY Left 12/18/2014   Procedure: AXILLARY LYMPH NODE BIOPSY/;  Surgeon: JRobert Bellow MD;  Location: [facility];  Service: General;  Laterality: Left;   AXILLARY  LYMPH NODE DISSECTION Left 12/18/2014   Procedure: AXILLARY LYMPH NODE DISSECTION;  Surgeon: Robert Bellow, MD;  Location: [facility];  Service: General;  Laterality: Left;   BREAST BIOPSY Left 05/2014   CORE BX OF LN, METASTATIC ADENOCARCINOMA   BREAST LUMPECTOMY Left 12/2014   CHEMO FIRST THEN SURGERY OF LN   BREAST SURGERY     lymph node removal   CATARACT EXTRACTION W/PHACO Left 11/26/2020   Procedure: CATARACT  EXTRACTION PHACO AND INTRAOCULAR LENS PLACEMENT ([place]) LEFT;  Surgeon: Birder Robson, MD;  Location: [place];  Service: Ophthalmology;  Laterality: Left;  6.40 00:43.9   CATARACT EXTRACTION W/PHACO Right 12/10/2020   Procedure: CATARACT EXTRACTION PHACO AND INTRAOCULAR LENS PLACEMENT (IOC) RIGHT;  Surgeon: Birder Robson, MD;  Location: [place];  Service: Ophthalmology;  Laterality: Right;  7.59 0:47.4   CHOLECYSTECTOMY N/A 04/19/2016   Procedure: LAPAROSCOPIC CHOLECYSTECTOMY WITH INTRAOPERATIVE CHOLANGIOGRAM;  Surgeon: Hubbard Robinson, MD;  Location: [facility];  Service: General;  Laterality: N/A;   COLON RESECTION Right 01/10/2019   Procedure: HAND ASSISTED LAPAROSCOPIC RIGHT COLON RESECTION;  Surgeon: Jules Husbands, MD;  Location: [facility];  Service: General;  Laterality: Right;   COLONOSCOPY WITH PROPOFOL N/A 12/30/2018   Procedure: COLONOSCOPY WITH PROPOFOL;  Surgeon: Lucilla Lame, MD;  Location: [place];  Service: Endoscopy;  Laterality: N/A;   COLONOSCOPY WITH PROPOFOL N/A 03/05/2020   Procedure: COLONOSCOPY WITH PROPOFOL;  Surgeon: Lucilla Lame, MD;  Location: [facility]COPY;  Service: Endoscopy;  Laterality: N/A;   medial branch block  11/06/2015   lumbar facet Dr. Primus Bravo   POLYPECTOMY N/A 12/30/2018   Procedure: POLYPECTOMY;  Surgeon: Lucilla Lame, MD;  Location: [place];  Service: Endoscopy;  Laterality: N/A;  Clips placed at Hepatic Flexure Polyp (2) and Transverse Colon Polyp (4) removal sites   PORTACATH PLACEMENT Right 02/02/2019   Procedure: INSERTION PORT-A-CATH;  Surgeon: Jules Husbands, MD;  Location: [facility];  Service: General;  Laterality: Right;   RADIOFREQUENCY ABLATION     lumbar   SENTINEL NODE BIOPSY Left 12/18/2014   Procedure: SENTINEL NODE BIOPSY;  Surgeon: Robert Bellow, MD;  Location: [facility];  Service: General;  Laterality: Left;    FAMILY HISTORY Family History  Problem Relation Age of Onset   Breast  cancer Sister 14   Colon cancer Sister    Colon cancer Mother        dx 82s   Hypertension Mother    Asthma Mother    Cancer Father        voice box removed   Cancer Maternal Grandmother        unsure type   Colon cancer Cousin    Cancer Cousin        unsure type       ADVANCED DIRECTIVES:    HEALTH MAINTENANCE: Social History   Tobacco Use   Smoking status: Former    Packs/day: 0.50    Types: Cigarettes    Quit date: 07/18/2014    Years since quitting: 7.2   Smokeless tobacco: Never  Vaping Use   Vaping Use: Never used  Substance Use Topics   Alcohol use: No    Alcohol/week: 0.0 standard drinks   Drug use: No    No Known Allergies  Current Outpatient Medications  Medication Sig Dispense Refill   acetaminophen (TYLENOL) 500 MG tablet Take 500 mg by mouth every 6 (six) hours as needed for mild pain or moderate pain.  albuterol (VENTOLIN HFA) 108 (90 Base) MCG/ACT inhaler Inhale 2 puffs into the lungs every 6 (six) hours as needed for wheezing or shortness of breath. 6.7 g 3   amLODipine (NORVASC) 5 MG tablet Take 1 tablet (5 mg total) by mouth daily. 90 tablet 3   atorvastatin (LIPITOR) 80 MG tablet Take 1 tablet (80 mg total) by mouth daily. 90 tablet 3   baclofen (LIORESAL) 10 MG tablet Take 1 tablet (10 mg total) by mouth 2 (two) times daily. 30 each 0   cetirizine (ZYRTEC) 10 MG tablet Take 10 mg by mouth daily at 6 (six) AM.     cholecalciferol (VITAMIN D) 1000 units tablet Take 1,000 Units by mouth daily.     fluticasone (FLONASE) 50 MCG/ACT nasal spray Use 2 spray(s) in each nostril once daily 16 g 3   gabapentin (NEURONTIN) 300 MG capsule Take 300-600 mg by mouth 4 (four) times daily as needed (neuropathic pain).      HYDROcodone-acetaminophen (NORCO) 5-325 MG tablet Take 1 tablet by mouth every 4 (four) hours as needed for moderate pain. 12 tablet 0   letrozole (FEMARA) 2.5 MG tablet Take 2.5 mg by mouth daily.     NARCAN 4 MG/0.1ML LIQD nasal spray kit  Place 1 spray into the nose.      ondansetron (ZOFRAN) 4 MG tablet Take 1 tablet (4 mg total) by mouth every 8 (eight) hours as needed for up to 10 doses for nausea or vomiting. 10 tablet 0   Oxycodone HCl 10 MG TABS LIMIT ONE HALF TO ONE TABLET BY MOUTH 3 TO 5 TIMES DAILY IF TOLERATED     PREVIDENT 5000 DRY MOUTH 1.1 % GEL dental gel Place onto teeth at bedtime.     promethazine (PHENERGAN) 25 MG tablet Take 1 tablet (25 mg total) by mouth every 8 (eight) hours as needed for nausea or vomiting. 20 tablet 0   pyridoxine (B-6) 100 MG tablet Take 100 mg by mouth daily.     sertraline (ZOLOFT) 100 MG tablet Take 2 tablets by mouth once daily 180 tablet 0   vitamin B-12 (CYANOCOBALAMIN) 500 MCG tablet Take 500 mcg by mouth daily.     No current facility-administered medications for this visit.   Facility-Administered Medications Ordered in Other Visits  Medication Dose Route Frequency Provider Last Rate Last Admin   heparin lock flush 100 unit/mL  500 Units Intravenous Once [name], MD       heparin lock flush 100 unit/mL  500 Units Intravenous Once [name], MD       sodium chloride flush (NS) 0.9 % injection 10 mL  10 mL Intravenous PRN [name], MD   10 mL at 02/27/19 2751    OBJECTIVE: There were no vitals filed for this visit.    There is no height or weight on file to calculate BMI.    ECOG FS:1 - Symptomatic but completely ambulatory  General: Well-developed, well-nourished, no acute distress. Eyes: Pink conjunctiva, anicteric sclera. HEENT: Normocephalic, moist mucous membranes. Lungs: No audible wheezing or coughing. Heart: Regular rate and rhythm. Abdomen: Soft, nontender, no obvious distention. Musculoskeletal: No edema, cyanosis, or clubbing. Neuro: Alert, answering all questions appropriately. Cranial nerves grossly intact. Skin: No rashes or petechiae noted. Psych: Normal affect.   LAB RESULTS:  Lab Results  Component Value Date   NA  138 07/01/2021   K 3.5 07/01/2021   CL 105 07/01/2021   CO2 26 07/01/2021   GLUCOSE 94  07/01/2021   BUN 11 07/01/2021   CREATININE 1.20 (H) 09/23/2021   CALCIUM 10.1 07/01/2021   PROT 7.5 07/01/2021   ALBUMIN 4.1 07/01/2021   AST 20 07/01/2021   ALT 15 07/01/2021   ALKPHOS 71 07/01/2021   BILITOT 1.3 (H) 07/01/2021   GFRNONAA >60 07/01/2021   GFRAA 57 (L) 02/26/2020    Lab Results  Component Value Date   WBC 9.0 07/01/2021   NEUTROABS 6.1 07/01/2021   HGB 13.0 07/01/2021   HCT 36.4 07/01/2021   MCV 84.1 07/01/2021   PLT 289 07/01/2021     STUDIES: CT CHEST ABDOMEN PELVIS W CONTRAST  Result Date: 09/23/2021 CLINICAL DATA:  Colorectal cancer, surveillance. EXAM: CT CHEST, ABDOMEN, AND PELVIS WITH CONTRAST TECHNIQUE: Multidetector CT imaging of the chest, abdomen and pelvis was performed following the standard protocol during bolus administration of intravenous contrast. RADIATION DOSE REDUCTION: This exam was performed according to the departmental dose-optimization program which includes automated exposure control, adjustment of the mA and/or kV according to patient size and/or use of iterative reconstruction technique. CONTRAST:  184m OMNIPAQUE IOHEXOL 300 MG/ML  SOLN COMPARISON:  Multiple priors including most recent CT March 20, 2021 July 01, 2021. FINDINGS: CT CHEST FINDINGS Cardiovascular: Aortic atherosclerosis. No central embolus on this nondedicated study. Normal size heart. No significant pericardial effusion/thickening. Mediastinum/Nodes: No supraclavicular adenopathy. No discrete thyroid nodule. No pathologically enlarged mediastinal hilar or axillary lymph nodes. Patulous esophagus with retained versus refluxed contrast in the esophagus. Lungs/Pleura: No suspicious nodules or masses. Mild diffuse bronchial wall thickening. No pleural effusion. No pneumothorax. Musculoskeletal: Multilevel degenerative changes spine. No aggressive lytic or blastic lesion of bone. No  suspicious chest wall mass. CT ABDOMEN PELVIS FINDINGS Hepatobiliary: No focal liver abnormality is seen. Status post cholecystectomy. No biliary dilatation. Pancreas: No pancreatic ductal dilatation or surrounding inflammatory changes. Spleen: Normal in size without focal abnormality. Adrenals/Urinary Tract: Adrenal glands are unremarkable. Kidneys are normal, without renal calculi, solid enhancing lesion, or hydronephrosis. Bladder is unremarkable. Stomach/Bowel: Small hiatal hernia. Stomach is otherwise unremarkable for degree of distension. No pathologic dilation of small or large bowel. Similar changes of right hemicolectomy. Scattered colonic diverticulosis. Moderate volume of formed stool in the colon. Vascular/Lymphatic: Aortic atherosclerosis without aneurysmal dilation. No pathologically enlarged abdominal or pelvic lymph nodes. Reproductive: Status post hysterectomy. No adnexal masses. Other: No significant abdominopelvic free fluid. No discrete peritoneal or omental nodularity. Musculoskeletal: No suspicious bone lesions identified. Multilevel degenerative changes spine with degenerative grade 1 anterolisthesis of L4 on L5. IMPRESSION: 1. Stable examination without new or progressive findings to suggest local recurrence or metastatic disease within the chest, abdomen, or pelvis. 2. Small hiatal hernia with patulous esophagus and retained versus refluxed contrast in the esophagus, may reflect esophageal reflux. 3. Aortic Atherosclerosis (ICD10-I70.0). Electronically Signed   By: JDahlia BailiffM.D.   On: 09/23/2021 11:50     ASSESSMENT: Stage IIa ER+ left breast cancer with no breast lesion and axillary lymph node metastasis. (TxN1M0)  HER-2 negative.  Now with stage IIIa colon cancer.  PLAN:   1.  Stage IIIa colon cancer: Pathology report reviewed independently.  Previously, patient agreed to enroll in clinical trial.  Oxaliplatin was discontinued early secondary to worsening neuropathy.  Patient  completed 12 cycles of chemotherapy on July 31, 2019 and then completed maintenance Tecentriq on January 29, 2020.  Restaging CT scan from March 20, 2021 reviewed independently and reported as above with no obvious evidence of recurrent or progressive disease.  CEA from today  is pending.  Colonoscopy on March 05, 2020 which only revealed a 4 mm benign polyp.  Because she has Lynch syndrome, this should be repeated in 1 year.  A referral has been sent back to GI to repeat colonoscopy.  Return to clinic in 6 months with repeat laboratory work and further evaluation.  2. Stage IIa ER+ left breast cancer with no breast lesion and axillary lymph node metastasis: Despite no obvious breast lesion on mammogram or breast MRI, pathology and pattern of spread was consistent with primary breast cancer. CT and bone scan at time of diagnosis revealed no other evidence of malignancy.  She only received 3 cycles of Adriamycin and Cytoxan. Taxol was discontinued early as well secondary to persistent peripheral neuropathy. Patient completed her chemotherapy on Nov 19, 2014.  She elected not to pursue axillary node dissection given the potential morbidity of this procedure. It was also elected not to pursue adjuvant XRT given there was no primary breast lesion.  Patient completed approximately 4 years of treatment with letrozole, but this was discontinued secondary to enrolling in clinical trial for her newly diagnosed colon cancer.  Her most recent mammogram on July 04, 2020 was reported as BI-RADS 1.  Repeat in December 2022.    3.  Bone health: Patient's most recent bone mineral density on February 03, 2018 reported T score of -0.7 which is unchanged from 3 years prior.  Continue monitoring and evaluation per primary care.  4.  Lynch syndrome: Genetic testing confirmed patient has Lynch syndrome.  Likely maternal patient reports her mother had colon cancer at a very young age.  She reports her sister and son both have  tested positive.  She reports her other children are not interested in genetic testing.  Appreciate genetics input.  Continue yearly colonoscopies as above.  5.  Endometrial cancer: Patient has had a total hysterectomy. 6.  Back pain/sciatica: Patient does not complain of this today.  Continue monthly follow-up with pain clinic as scheduled.  Patient reports insurance will only allow 3 injections every 6 months.   7.  Peripheral neuropathy: Patient does not complain of this today.  Continue gabapentin 600 mg 4 times per day.  Continue follow-up with pain clinic as scheduled.  She declined referral to neuro oncology. 8.  Anemia: Resolved. 9.  Renal insufficiency: Nearly resolved.   Patient expressed understanding and was in agreement with this plan. She also understands that She can call clinic at any time with any questions, concerns, or complaints.   Breast cancer metastasized to axillary lymph node   Staging form: Breast, AJCC 7th Edition     Clinical stage from 10/22/2014: Stage Unknown (TX, N1, M0) - Signed by [name], MD on 10/22/2014   [name], MD   09/28/2021 8:35 AM

## 2021-09-28 NOTE — Assessment & Plan Note (Addendum)
Due to acute viral gastroenteritis.  Continue symptomatic management and IV hydration

## 2021-09-28 NOTE — Assessment & Plan Note (Addendum)
Outpatient follow-up with oncology. 

## 2021-09-28 NOTE — Assessment & Plan Note (Addendum)
-   Continue amlodipine ?

## 2021-09-28 NOTE — ED Triage Notes (Signed)
Pt comes pov with n/v/d since this morning. Chills, hasn't checked for fever.

## 2021-09-28 NOTE — H&P (Signed)
History and Physical    Patient: Carrie Alvarez IFO:[id number] DOB: 09-03-1950 DOA: 09/28/2021 DOS: the patient was seen and examined on 09/28/2021 PCP: Birdie Sons, MD  Patient coming from: Home  Chief Complaint:  Chief Complaint  Patient presents with   Emesis   Diarrhea   HPI: Carrie Alvarez is a 71 y.o. female with medical history significant of colon cancer, anemia, breast cancer, hypertension, depression presenting with nausea vomiting and diarrhea since last night. Patient denies any alleviating or aggravating factors patient did not use any over-the-counter medications for her diarrhea.  Stool she has described as watery stool with no blood or color change.  Otherwise also patient is not having any melena or hematochezia. Patient is currently in pain and uncomfortable.  Patient does not report any headaches blurred vision chest pain shortness of breath.  No fevers or chills or any other complaints otherwise.  Review of Systems: Review of Systems  Gastrointestinal:  Positive for abdominal pain, diarrhea, nausea and vomiting.  All other systems reviewed and are negative.  Past Medical History:  Diagnosis Date   Allergy    Anemia    Arthritis    Back pain    Breast cancer ([place]) 2015   LT LUMPECTOMY 12-2014 FOLLOWING CHEMO   Carpal tunnel syndrome    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   Cataract    Chronic pain    Colon cancer ([place]) 01/2019   Depression    Dyspnea    Family history of breast cancer    Family history of colon cancer    GERD (gastroesophageal reflux disease)    problem with reflux during chemo   History of methicillin resistant staphylococcus aureus (MRSA)    Hypercholesteremia    Hypertension    Lumbosacral pain    sees pain management in [place]   Obesity    Personal history of chemotherapy 2016   left breast ca   Personal history of chemotherapy 2020   colon ca   Pinched nerve    left side, lumbar area   Uterine  cancer ([place]) 1994   Past Surgical History:  Procedure Laterality Date   ABDOMINAL HYSTERECTOMY  1994   UTERINE CA   AXILLARY LYMPH NODE BIOPSY Left 12/18/2014   Procedure: AXILLARY LYMPH NODE BIOPSY/;  Surgeon: Robert Bellow, MD;  Location: [facility];  Service: General;  Laterality: Left;   AXILLARY LYMPH NODE DISSECTION Left 12/18/2014   Procedure: AXILLARY LYMPH NODE DISSECTION;  Surgeon: Robert Bellow, MD;  Location: [facility];  Service: General;  Laterality: Left;   BREAST BIOPSY Left 05/2014   CORE BX OF LN, METASTATIC ADENOCARCINOMA   BREAST LUMPECTOMY Left 12/2014   CHEMO FIRST THEN SURGERY OF LN   BREAST SURGERY     lymph node removal   CATARACT EXTRACTION W/PHACO Left 11/26/2020   Procedure: CATARACT EXTRACTION PHACO AND INTRAOCULAR LENS PLACEMENT ([place]) LEFT;  Surgeon: Birder Robson, MD;  Location: [place] ;  Service: Ophthalmology;  Laterality: Left;  6.40 00:43.9   CATARACT EXTRACTION W/PHACO Right 12/10/2020   Procedure: CATARACT EXTRACTION PHACO AND INTRAOCULAR LENS PLACEMENT (IOC) RIGHT;  Surgeon: Birder Robson, MD;  Location: [place];  Service: Ophthalmology;  Laterality: Right;  7.59 0:47.4   CHOLECYSTECTOMY N/A 04/19/2016   Procedure: LAPAROSCOPIC CHOLECYSTECTOMY WITH INTRAOPERATIVE CHOLANGIOGRAM;  Surgeon: Hubbard Robinson, MD;  Location: [facility];  Service: General;  Laterality: N/A;   COLON RESECTION Right 01/10/2019   Procedure: HAND ASSISTED LAPAROSCOPIC RIGHT COLON  RESECTION;  Surgeon: Jules Husbands, MD;  Location: [facility];  Service: General;  Laterality: Right;   COLONOSCOPY WITH PROPOFOL N/A 12/30/2018   Procedure: COLONOSCOPY WITH PROPOFOL;  Surgeon: Lucilla Lame, MD;  Location: [place];  Service: Endoscopy;  Laterality: N/A;   COLONOSCOPY WITH PROPOFOL N/A 03/05/2020   Procedure: COLONOSCOPY WITH PROPOFOL;  Surgeon: Lucilla Lame, MD;  Location: [facility];  Service: Endoscopy;  Laterality: N/A;   medial branch  block  11/06/2015   lumbar facet Dr. Primus Bravo   POLYPECTOMY N/A 12/30/2018   Procedure: POLYPECTOMY;  Surgeon: Lucilla Lame, MD;  Location: [place];  Service: Endoscopy;  Laterality: N/A;  Clips placed at Hepatic Flexure Polyp (2) and Transverse Colon Polyp (4) removal sites   PORTACATH PLACEMENT Right 02/02/2019   Procedure: INSERTION PORT-A-CATH;  Surgeon: Jules Husbands, MD;  Location: [facility];  Service: General;  Laterality: Right;   RADIOFREQUENCY ABLATION     lumbar   SENTINEL NODE BIOPSY Left 12/18/2014   Procedure: SENTINEL NODE BIOPSY;  Surgeon: Robert Bellow, MD;  Location: [facility];  Service: General;  Laterality: Left;   Social History:  reports that she quit smoking about 7 years ago. Her smoking use included cigarettes. She smoked an average of .5 packs per day. She has never used smokeless tobacco. She reports that she does not drink alcohol and does not use drugs.  No Known Allergies  Family History  Problem Relation Age of Onset   Breast cancer Sister 87   Colon cancer Sister    Colon cancer Mother        dx 55s   Hypertension Mother    Asthma Mother    Cancer Father        voice box removed   Cancer Maternal Grandmother        unsure type   Colon cancer Cousin    Cancer Cousin        unsure type    Prior to Admission medications   Medication Sig Start Date End Date Taking? Authorizing Provider  acetaminophen (TYLENOL) 500 MG tablet Take 500 mg by mouth every 6 (six) hours as needed for mild pain or moderate pain.     [provider]  albuterol (VENTOLIN HFA) 108 (90 Base) MCG/ACT inhaler Inhale 2 puffs into the lungs every 6 (six) hours as needed for wheezing or shortness of breath. 02/11/21   Chrismon, Vickki Muff, PA-C  amLODipine (NORVASC) 5 MG tablet Take 1 tablet (5 mg total) by mouth daily. 08/12/21   Gwyneth Sprout, FNP  atorvastatin (LIPITOR) 80 MG tablet Take 1 tablet (80 mg total) by mouth daily. 08/12/21   Gwyneth Sprout, FNP  baclofen  (LIORESAL) 10 MG tablet Take 1 tablet (10 mg total) by mouth 2 (two) times daily. 10/28/17   Trinna Post, PA-C  cetirizine (ZYRTEC) 10 MG tablet Take 10 mg by mouth daily at 6 (six) AM.    [provider]  cholecalciferol (VITAMIN D) 1000 units tablet Take 1,000 Units by mouth daily.    [provider]  fluticasone Asencion Islam) 50 MCG/ACT nasal spray Use 2 spray(s) in each nostril once daily 02/11/21   Chrismon, Simona Huh E, PA-C  gabapentin (NEURONTIN) 300 MG capsule Take 300-600 mg by mouth 4 (four) times daily as needed (neuropathic pain).     [provider]  HYDROcodone-acetaminophen (NORCO) 5-325 MG tablet Take 1 tablet by mouth every 4 (four) hours as needed for moderate pain. 03/19/21  Naaman Plummer, MD  letrozole [facility]) 2.5 MG tablet Take 2.5 mg by mouth daily. 04/07/19   [provider]  NARCAN 4 MG/0.1ML LIQD nasal spray kit Place 1 spray into the nose.  06/06/19   [provider]  ondansetron (ZOFRAN) 4 MG tablet Take 1 tablet (4 mg total) by mouth every 8 (eight) hours as needed for up to 10 doses for nausea or vomiting. 07/01/21   Lucrezia Starch, MD  Oxycodone HCl 10 MG TABS LIMIT ONE HALF TO ONE TABLET BY MOUTH 3 TO 5 TIMES DAILY IF TOLERATED 02/21/19   [provider]  PREVIDENT 5000 DRY MOUTH 1.1 % GEL dental gel Place onto teeth at bedtime. 06/28/20   [provider]  promethazine (PHENERGAN) 25 MG tablet Take 1 tablet (25 mg total) by mouth every 8 (eight) hours as needed for nausea or vomiting. 07/23/21   Birdie Sons, MD  pyridoxine (B-6) 100 MG tablet Take 100 mg by mouth daily.    [provider]  sertraline (ZOLOFT) 100 MG tablet Take 2 tablets by mouth once daily 10/24/20   Chrismon, Vickki Muff, PA-C  vitamin B-12 (CYANOCOBALAMIN) 500 MCG tablet Take 500 mcg by mouth daily.    [provider]    Physical Exam: Vitals:   09/28/21 1619 09/28/21 1630 09/28/21 1930 09/28/21 2114  BP:  (!) 142/93  138/86 (!) 142/88  Pulse:  (!) 111 (!) 104 (!) 102  Resp:  (!) _0 Temp:  98.9 F (37.2 C)  98.4 F (36.9 C)  TempSrc:  Oral  Oral  SpO2:  99% 99% 99%  Weight: 90.7 kg     Height: _1  (1.6 m)      Physical Exam Vitals and nursing note reviewed.  Constitutional:      General: She is not in acute distress.    Appearance: Normal appearance. She is not ill-appearing, toxic-appearing or diaphoretic.  HENT:     Head: Normocephalic and atraumatic.     Right Ear: Hearing and external ear normal.     Left Ear: Hearing and external ear normal.     Nose: Nose normal. No nasal deformity.     Mouth/Throat:     Lips: Pink.     Mouth: Mucous membranes are moist.     Tongue: No lesions.     Pharynx: Oropharynx is clear.  Eyes:     Extraocular Movements: Extraocular movements intact.     Pupils: Pupils are equal, round, and reactive to light.  Cardiovascular:     Rate and Rhythm: Normal rate and regular rhythm.     Pulses: Normal pulses.     Heart sounds: Normal heart sounds.  Pulmonary:     Effort: Pulmonary effort is normal.     Breath sounds: Normal breath sounds.  Abdominal:     General: Bowel sounds are normal. There is no distension.     Palpations: Abdomen is soft. There is no mass.     Tenderness: There is abdominal tenderness. There is guarding.     Hernia: No hernia is present.  Musculoskeletal:     Right lower leg: No edema.     Left lower leg: No edema.  Skin:    General: Skin is warm.  Neurological:     General: No focal deficit present.     Mental Status: She is alert and oriented to person, place, and time.     Cranial Nerves: Cranial nerves 2-12 are intact. No cranial  nerve deficit.     Motor: Motor function is intact.  Psychiatric:        Attention and Perception: Attention normal.        Mood and Affect: Mood normal.        Speech: Speech normal.        Behavior: Behavior normal. Behavior is cooperative.        Cognition and Memory: Cognition normal.     Data Reviewed: Results for orders placed or performed during the hospital encounter of 09/28/21 (from the past 24 hour(s))  Lipase, blood     Status: None   Collection Time: 09/28/21  4:20 PM  Result Value Ref Range   Lipase 26 11 - 51 U/L  Comprehensive metabolic panel     Status: Abnormal   Collection Time: 09/28/21  4:20 PM  Result Value Ref Range   Sodium 140 135 - 145 mmol/L   Potassium 3.6 3.5 - 5.1 mmol/L   Chloride 108 98 - 111 mmol/L   CO2 23 22 - 32 mmol/L   Glucose, Bld 113 (H) 70 - 99 mg/dL   BUN 15 8 - 23 mg/dL   Creatinine, Ser 1.11 (H) 0.44 - 1.00 mg/dL   Calcium 9.7 8.9 - 10.3 mg/dL   Total Protein 7.7 6.5 - 8.1 g/dL   Albumin 4.1 3.5 - 5.0 g/dL   AST 25 15 - 41 U/L   ALT 18 0 - 44 U/L   Alkaline Phosphatase 75 38 - 126 U/L   Total Bilirubin 1.2 0.3 - 1.2 mg/dL   GFR, Estimated 53 (L) >60 mL/min   Anion gap 9 5 - 15  CBC     Status: Abnormal   Collection Time: 09/28/21  4:20 PM  Result Value Ref Range   WBC 13.4 (H) 4.0 - 10.5 K/uL   RBC 4.59 3.87 - 5.11 MIL/uL   Hemoglobin 12.9 12.0 - 15.0 g/dL   HCT 38.0 36.0 - 46.0 %   MCV 82.8 80.0 - 100.0 fL   MCH 28.1 26.0 - 34.0 pg   MCHC 33.9 30.0 - 36.0 g/dL   RDW 13.6 11.5 - 15.5 %   Platelets 233 150 - 400 K/uL   nRBC 0.0 0.0 - 0.2 %  Resp Panel by RT-PCR (Flu A&B, Covid) Nasopharyngeal Swab     Status: None   Collection Time: 09/28/21  4:26 PM   Specimen: Nasopharyngeal Swab; Nasopharyngeal(NP) swabs in vial transport medium  Result Value Ref Range   SARS Coronavirus 2 by RT PCR NEGATIVE NEGATIVE   Influenza A by PCR NEGATIVE NEGATIVE   Influenza B by PCR NEGATIVE NEGATIVE  Procalcitonin - Baseline     Status: None   Collection Time: 09/28/21  5:07 PM  Result Value Ref Range   Procalcitonin 0.27 ng/mL   >> CT abd pelvis: IMPRESSION: 1. Fluid within the colon, suggestive of diarrheal illness. No bowel inflammation or obstruction. 2. Patchy ground-glass and nodular opacities in the right lower  and right middle lobe, new from recent CT, likely infectious or inflammatory bronchiolitis. 3. Small hiatal hernia.  Aortic Atherosclerosis (ICD10-I70.0).    Assessment and Plan: * Abdominal pain Patient presenting with generalized abdominal pain.  Patient has a history of colon cancer and has intermittent abdominal pain that she reports.d/d include malignancy, infectious, vascular.  This time she is having abdominal pain along with nausea vomiting and diarrhea the pain is generalized it started last night and she reports that it has been 10 out of 10  sharp not radiating to any other parts of the belly or the back.  Patient also reports loose watery stools no blood and no melena no hematochezia currently or in the past.lactic pending CT shows concern for diarrheal illness, differentials include bacterial/viral gastroenteritis. We will obtain procalcitonin stool cultures. Consider GI consult per a.m. team if symptoms are persistent and unresolving. Supportive care with IV PPI therapy.  Sepsis [facility]) Patient does meet sepsis criteria with suspected colonic infection, We will obtain a lactic acid level and follow. We will start patient on.  Pain medications, Tylenol. Supportive care with maintenance IV fluid regimen. Blood and stool cultures collected and we will follow.    Nausea vomiting and diarrhea Supportive care with IV PPI therapy, antiemetics. We will consider GI consult if symptoms persist or are unresolving.   AKI (acute kidney injury) ([place]) Lab Results  Component Value Date   CREATININE 1.11 (H) 09/28/2021   CREATININE 1.20 (H) 09/23/2021   CREATININE 0.82 07/01/2021  Patient has acute kidney injury with elevated creatinine as above, will hold all nephrotoxic agents and meds and avoid contrast studies.We will renally dose all needed medications.   Colon cancer [facility]) Patient has a history of colon cancer and will consider GI consult for evaluation if patient's diarrhea  persists or gets worse.  Hypertension Blood pressure (!) 142/88, pulse (!) 102, temperature 98.4 F (36.9 C), temperature source Oral, resp. rate 18, height _0  (1.6 m), weight 90.7 kg, SpO2 99 %. Continue patient on amlodipine 5 mg. As needed hydralazine for systolic blood pressure 962 and above.   Anemia , CBC Latest Ref Rng & Units 09/28/2021 07/01/2021 03/25/2021  WBC 4.0 - 10.5 K/uL 13.4(H) 9.0 7.6  Hemoglobin 12.0 - 15.0 g/dL 12.9 13.0 11.0(L)  Hematocrit 36.0 - 46.0 % 38.0 36.4 31.9(L)  Platelets 150 - 400 K/uL 233 289 219  Patient has been intermittently anemic with her history of colon cancer will consider GI consult.   Depression We will continue patient's Zoloft if she tolerates it p.o..   Advance Care Planning:   Code Status: Full Code   Consults:  None   Family Communication:  Verl Blalock (Daughter)  [phone] (Mobile)  Severity of Illness: The appropriate patient status for this patient is OBSERVATION. Observation status is judged to be reasonable and necessary in order to provide the required intensity of service to ensure the patient's safety. The patient's presenting symptoms, physical exam findings, and initial radiographic and laboratory data in the context of their medical condition is felt to place them at decreased risk for further clinical deterioration. Furthermore, it is anticipated that the patient will be medically stable for discharge from the hospital within 2 midnights of admission.   Author: Para Skeans, MD 09/28/2021 9:41 PM  For on call review [email or web address].

## 2021-09-28 NOTE — Assessment & Plan Note (Addendum)
Acute viral gastroenteritis due to norovirus (stool panel positive for norovirus).  Symptomatic management.  Advance diet as tolerated

## 2021-09-28 NOTE — Assessment & Plan Note (Addendum)
,   CBC Latest Ref Rng & Units 09/28/2021 07/01/2021 03/25/2021  WBC 4.0 - 10.5 K/uL 13.4(H) 9.0 7.6  Hemoglobin 12.0 - 15.0 g/dL 12.9 13.0 11.0(L)  Hematocrit 36.0 - 46.0 % 38.0 36.4 31.9(L)  Platelets 150 - 400 K/uL 233 289 219  Stable H&H

## 2021-09-28 NOTE — ED Provider Notes (Signed)
[facility] Provider Note    Event Date/Time   First MD Initiated Contact with Patient 09/28/21 1625     (approximate)   History   Emesis and Diarrhea   HPI  Carrie Alvarez is a 71 y.o. female with a history of hypertension and high cholesterol who presents with nausea and vomiting, acute onset this morning, persistent course, associated with nonbloody diarrhea as well as with crampy lower abdominal pain.  The patient denies any sick contacts, unusual foods, or travel.     Physical Exam   Triage Vital Signs: ED Triage Vitals  Enc Vitals Group     BP 09/28/21 1630 (!) 142/93     Pulse Rate 09/28/21 1630 (!) 111     Resp 09/28/21 1630 (!) 23     Temp 09/28/21 1630 98.9 F (37.2 C)     Temp Source 09/28/21 1630 Oral     SpO2 09/28/21 1630 99 %     Weight 09/28/21 1619 200 lb (90.7 kg)     Height 09/28/21 1619 '5\' 3"'$  (1.6 m)     Head Circumference --      Peak Flow --      Pain Score 09/28/21 1619 9     Pain Loc --      Pain Edu? --      Excl. in [place]? --     Most recent vital signs: Vitals:   09/28/21 1630 09/28/21 1930  BP: (!) 142/93 138/86  Pulse: (!) 111 (!) 104  Resp: (!) 23 17  Temp: 98.9 F (37.2 C)   SpO2: 99% 99%     General: Alert, uncomfortable appearing. CV:  Good peripheral perfusion.  Resp:  Normal effort.  Abd:  Soft with moderate bilateral lower quadrant tenderness no distention.  Other:  Dry mucous membranes.  No scleral icterus.   ED Results / Procedures / Treatments   Labs (all labs ordered are listed, but only abnormal results are displayed) Labs Reviewed  COMPREHENSIVE METABOLIC PANEL - Abnormal; Notable for the following components:      Result Value   Glucose, Bld 113 (*)    Creatinine, Ser 1.11 (*)    GFR, Estimated 53 (*)    All other components within normal limits  CBC - Abnormal; Notable for the following components:   WBC 13.4 (*)    All other components within normal limits  RESP PANEL  BY RT-PCR (FLU A&B, COVID) [id number]  CULTURE, BLOOD (ROUTINE X 2)  CULTURE, BLOOD (ROUTINE X 2)  LIPASE, BLOOD  URINALYSIS, ROUTINE W REFLEX MICROSCOPIC  LACTIC ACID, PLASMA  LACTIC ACID, PLASMA  PROCALCITONIN  OCCULT BLOOD X 1 CARD TO LAB, STOOL  COMPREHENSIVE METABOLIC PANEL  LACTIC ACID, PLASMA  LACTIC ACID, PLASMA  PROTIME-INR  APTT  TSH  T4, FREE     EKG  ED ECG REPORT I, Arta Silence, the attending physician, personally viewed and interpreted this ECG.  Date: 09/28/2021 EKG Time: 1627 Rate: 108 Rhythm: Sinus tachycardia QRS Axis: Borderline left axis Intervals: normal ST/T Wave abnormalities: Nonspecific T wave abnormalities Narrative Interpretation: Nonspecific T wave abnormalities with no evidence of acute ischemia; no significant change compared to EKG of 07/01/2021    RADIOLOGY  CT abdomen/pelvis: I independently viewed and interpreted the images; there are no thickened or dilated bowel loops, no free air or free fluid  PROCEDURES:  Critical Care performed: No  Procedures   MEDICATIONS ORDERED IN ED: Medications  pantoprazole (PROTONIX) injection 40 mg (has  no administration in time range)  cholecalciferol (VITAMIN D3) tablet 1,000 Units (has no administration in time range)  pyridOXINE (VITAMIN B-6) tablet 100 mg (has no administration in time range)  gabapentin (NEURONTIN) capsule 300-600 mg (has no administration in time range)  baclofen (LIORESAL) tablet 10 mg (has no administration in time range)  Oxycodone HCl TABS 5 mg (has no administration in time range)  letrozole [facility]) tablet 2.5 mg (has no administration in time range)  sodium fluoride (FLUORISHIELD) 1.1 % dental gel (has no administration in time range)  sertraline (ZOLOFT) tablet 200 mg (has no administration in time range)  vitamin B-12 (CYANOCOBALAMIN) tablet 500 mcg (has no administration in time range)  albuterol (VENTOLIN HFA) 108 (90 Base) MCG/ACT inhaler 2 puff (has no  administration in time range)  fluticasone (FLONASE) 50 MCG/ACT nasal spray 1 spray (has no administration in time range)  HYDROcodone-acetaminophen (NORCO/VICODIN) 5-325 MG per tablet 1 tablet (has no administration in time range)  ondansetron (ZOFRAN) tablet 4 mg (has no administration in time range)  promethazine (PHENERGAN) tablet 25 mg (has no administration in time range)  atorvastatin (LIPITOR) tablet 80 mg (has no administration in time range)  amLODipine (NORVASC) tablet 5 mg (has no administration in time range)  loratadine (CLARITIN) tablet 10 mg (has no administration in time range)  insulin aspart (novoLOG) injection 0-9 Units (has no administration in time range)  heparin injection 5,000 Units (has no administration in time range)  0.9 %  sodium chloride infusion (has no administration in time range)  aspirin EC tablet 81 mg (has no administration in time range)  ondansetron (ZOFRAN) injection 4 mg (4 mg Intravenous Given 09/28/21 1718)  morphine (PF) 4 MG/ML injection 4 mg (4 mg Intravenous Given 09/28/21 1718)  sodium chloride 0.9 % bolus 1,000 mL (0 mLs Intravenous Stopped 09/28/21 1828)  iohexol (OMNIPAQUE) 300 MG/ML solution 100 mL (100 mLs Intravenous Contrast Given 09/28/21 1818)  HYDROmorphone (DILAUDID) injection 0.5 mg (0.5 mg Intravenous Given 09/28/21 1925)  metoCLOPramide (REGLAN) injection 10 mg (10 mg Intravenous Given 09/28/21 1926)  dicyclomine (BENTYL) capsule 10 mg (10 mg Oral Given 09/28/21 1926)     IMPRESSION / MDM / ASSESSMENT AND PLAN / ED COURSE  I reviewed the triage vital signs and the nursing notes.  71 year old female with PMH as noted above presents with nausea, vomiting, and diarrhea which started acutely today.  She also has crampy lower abdominal pain.  On exam the patient is slightly tachycardic with otherwise normal vital signs.  She is uncomfortable appearing, actively having abdominal cramps while he was in the room, and is somewhat tender in the  lower abdomen.  Differential diagnosis includes, but is not limited to, viral gastroenteritis, foodborne illness, diverticulitis, colitis.  We will obtain lab work-up, CT abdomen/pelvis, give fluids, antiemetics, analgesia and reassess  ----------------------------------------- 8:00 PM on 09/28/2021 -----------------------------------------  CT shows no acute intra-abdominal findings.  There are some chronic loss opacities in the lower lung fields, however the patient has no cough, shortness of breath or any other clinical evidence of pneumonia.  I have ordered a chest x-ray for further evaluation.  She is still having significant crampy pain and nausea.  I have ordered additional medication, however the patient is not well enough to go home at this time.  I consulted Dr. Posey Pronto from the hospitalist service; based on her discussion she agrees to admit the patient.    FINAL CLINICAL IMPRESSION(S) / ED DIAGNOSES   Final diagnoses:  Gastroenteritis  Rx / DC Orders   ED Discharge Orders     None        Note:  This document was prepared using Dragon voice recognition software and may include unintentional dictation errors.    Arta Silence, MD 09/28/21 2001

## 2021-09-28 NOTE — Assessment & Plan Note (Addendum)
Patient presenting with generalized abdominal pain.  Patient has a history of colon cancer and has intermittent abdominal pain that she reports.d/d include malignancy, infectious, vascular.  This time she is having abdominal pain along with nausea vomiting and diarrhea the pain is generalized it started last night and she reports that it has been 10 out of 10 sharp not radiating to any other parts of the belly or the back.  Patient also reports loose watery stools no blood and no melena no hematochezia currently or in the past.lactic pending CT shows concern for diarrheal illness, differentials include bacterial/viral gastroenteritis. We will obtain procalcitonin stool cultures. Consider GI consult per a.m. team if symptoms are persistent and unresolving. Supportive care with IV PPI therapy.

## 2021-09-28 NOTE — Assessment & Plan Note (Addendum)
Continue Zoloft 

## 2021-09-28 NOTE — Assessment & Plan Note (Addendum)
Lab Results  Component Value Date   CREATININE 1.11 (H) 09/28/2021   CREATININE 1.20 (H) 09/23/2021   CREATININE 0.82 07/01/2021  Due to dehydration from nausea vomiting and diarrhea.  Continue IV hydration and monitor renal function

## 2021-09-29 ENCOUNTER — Inpatient Hospital Stay: Payer: Medicare (Managed Care) | Admitting: Nurse Practitioner

## 2021-09-29 ENCOUNTER — Inpatient Hospital Stay: Payer: Medicare (Managed Care)

## 2021-09-29 DIAGNOSIS — D649 Anemia, unspecified: Secondary | ICD-10-CM | POA: Diagnosis present

## 2021-09-29 DIAGNOSIS — Z20822 Contact with and (suspected) exposure to covid-19: Secondary | ICD-10-CM | POA: Diagnosis present

## 2021-09-29 DIAGNOSIS — Z6835 Body mass index (BMI) 35.0-35.9, adult: Secondary | ICD-10-CM | POA: Diagnosis not present

## 2021-09-29 DIAGNOSIS — I10 Essential (primary) hypertension: Secondary | ICD-10-CM | POA: Diagnosis present

## 2021-09-29 DIAGNOSIS — Z8249 Family history of ischemic heart disease and other diseases of the circulatory system: Secondary | ICD-10-CM | POA: Diagnosis not present

## 2021-09-29 DIAGNOSIS — A4189 Other specified sepsis: Secondary | ICD-10-CM | POA: Diagnosis present

## 2021-09-29 DIAGNOSIS — N179 Acute kidney failure, unspecified: Secondary | ICD-10-CM | POA: Diagnosis present

## 2021-09-29 DIAGNOSIS — Z8542 Personal history of malignant neoplasm of other parts of uterus: Secondary | ICD-10-CM | POA: Diagnosis not present

## 2021-09-29 DIAGNOSIS — Z85038 Personal history of other malignant neoplasm of large intestine: Secondary | ICD-10-CM | POA: Diagnosis not present

## 2021-09-29 DIAGNOSIS — Z87891 Personal history of nicotine dependence: Secondary | ICD-10-CM | POA: Diagnosis not present

## 2021-09-29 DIAGNOSIS — K219 Gastro-esophageal reflux disease without esophagitis: Secondary | ICD-10-CM | POA: Diagnosis present

## 2021-09-29 DIAGNOSIS — Z800 Family history of malignant neoplasm of digestive organs: Secondary | ICD-10-CM | POA: Diagnosis not present

## 2021-09-29 DIAGNOSIS — K529 Noninfective gastroenteritis and colitis, unspecified: Secondary | ICD-10-CM | POA: Diagnosis present

## 2021-09-29 DIAGNOSIS — G8929 Other chronic pain: Secondary | ICD-10-CM | POA: Diagnosis present

## 2021-09-29 DIAGNOSIS — F32A Depression, unspecified: Secondary | ICD-10-CM

## 2021-09-29 DIAGNOSIS — A419 Sepsis, unspecified organism: Secondary | ICD-10-CM | POA: Diagnosis present

## 2021-09-29 DIAGNOSIS — A0811 Acute gastroenteropathy due to Norwalk agent: Secondary | ICD-10-CM | POA: Diagnosis present

## 2021-09-29 DIAGNOSIS — Z803 Family history of malignant neoplasm of breast: Secondary | ICD-10-CM | POA: Diagnosis not present

## 2021-09-29 DIAGNOSIS — E669 Obesity, unspecified: Secondary | ICD-10-CM | POA: Diagnosis present

## 2021-09-29 DIAGNOSIS — Z79899 Other long term (current) drug therapy: Secondary | ICD-10-CM | POA: Diagnosis not present

## 2021-09-29 DIAGNOSIS — Z9221 Personal history of antineoplastic chemotherapy: Secondary | ICD-10-CM | POA: Diagnosis not present

## 2021-09-29 DIAGNOSIS — Z853 Personal history of malignant neoplasm of breast: Secondary | ICD-10-CM | POA: Diagnosis not present

## 2021-09-29 DIAGNOSIS — M1990 Unspecified osteoarthritis, unspecified site: Secondary | ICD-10-CM | POA: Diagnosis present

## 2021-09-29 DIAGNOSIS — Z825 Family history of asthma and other chronic lower respiratory diseases: Secondary | ICD-10-CM | POA: Diagnosis not present

## 2021-09-29 DIAGNOSIS — E7800 Pure hypercholesterolemia, unspecified: Secondary | ICD-10-CM | POA: Diagnosis present

## 2021-09-29 LAB — GASTROINTESTINAL PANEL BY PCR, STOOL (REPLACES STOOL CULTURE)

## 2021-09-29 LAB — URINALYSIS, ROUTINE W REFLEX MICROSCOPIC
Bilirubin Urine: NEGATIVE
Glucose, UA: NEGATIVE mg/dL
Ketones, ur: NEGATIVE mg/dL
Nitrite: NEGATIVE
Protein, ur: NEGATIVE mg/dL
Specific Gravity, Urine: 1.026 (ref 1.005–1.030)
pH: 5 (ref 5.0–8.0)

## 2021-09-29 LAB — APTT: aPTT: 31 seconds (ref 24–36)

## 2021-09-29 LAB — PROTIME-INR
INR: 1.1 (ref 0.8–1.2)
Prothrombin Time: 14.2 seconds (ref 11.4–15.2)

## 2021-09-29 LAB — COMPREHENSIVE METABOLIC PANEL
ALT: 17 U/L (ref 0–44)
AST: 22 U/L (ref 15–41)
Albumin: 3.8 g/dL (ref 3.5–5.0)
Alkaline Phosphatase: 66 U/L (ref 38–126)
Anion gap: 9 (ref 5–15)
BUN: 15 mg/dL (ref 8–23)
CO2: 23 mmol/L (ref 22–32)
Calcium: 9.1 mg/dL (ref 8.9–10.3)
Chloride: 107 mmol/L (ref 98–111)
Creatinine, Ser: 1.1 mg/dL — ABNORMAL HIGH (ref 0.44–1.00)
GFR, Estimated: 54 mL/min — ABNORMAL LOW (ref >60)
Glucose, Bld: 107 mg/dL — ABNORMAL HIGH (ref 70–99)
Potassium: 3.6 mmol/L (ref 3.5–5.1)
Sodium: 139 mmol/L (ref 135–145)
Total Bilirubin: 1 mg/dL (ref 0.3–1.2)
Total Protein: 7.6 g/dL (ref 6.5–8.1)

## 2021-09-29 LAB — GLUCOSE, CAPILLARY
Glucose-Capillary: 98 mg/dL (ref 70–99)
Glucose-Capillary: 97 mg/dL (ref 70–99)
Glucose-Capillary: 102 mg/dL — ABNORMAL HIGH (ref 70–99)

## 2021-09-29 LAB — OCCULT BLOOD X 1 CARD TO LAB, STOOL: Fecal Occult Bld: NEGATIVE

## 2021-09-29 LAB — TSH: TSH: 0.89 u[IU]/mL (ref 0.350–4.500)

## 2021-09-29 LAB — LACTIC ACID, PLASMA
Lactic Acid, Venous: 1 mmol/L (ref 0.5–1.9)
Lactic Acid, Venous: 1.8 mmol/L (ref 0.5–1.9)

## 2021-09-29 LAB — T4, FREE: Free T4: 0.8 ng/dL (ref 0.61–1.12)

## 2021-09-29 MED ORDER — ONDANSETRON HCL 4 MG/2ML IJ SOLN
4.0000 mg | Freq: Three times a day (TID) | INTRAMUSCULAR | Status: DC | PRN
  Administered 2021-09-30: 4 mg via INTRAVENOUS
  Filled 2021-09-29 (×2): qty 2

## 2021-09-29 MED ORDER — PROMETHAZINE HCL 25 MG PO TABS
25.0000 mg | ORAL_TABLET | Freq: Three times a day (TID) | ORAL | Status: DC | PRN
  Filled 2021-09-29: qty 1

## 2021-09-29 MED ORDER — SODIUM CHLORIDE 0.9 % IV SOLN: INTRAVENOUS | Status: AC

## 2021-09-29 NOTE — Progress Notes (Signed)
  Progress Note   Patient: Carrie Alvarez [id number]:[id number] DOB: 04/27/1951 DOA: 09/28/2021     0 DOS: the patient was seen and examined on 09/29/2021   Brief hospital course: 71 y.o. female with medical history significant of colon cancer, anemia, breast cancer, hypertension, depression presenting with nausea vomiting and diarrhea since last night.   3/20 -  Stool panel positive for norovirus   Assessment and Plan: * Sepsis ([place]) Due to acute viral gastroenteritis.  Continue symptomatic management and IV hydration   Abdominal pain-resolved as of 09/29/2021 Patient presenting with generalized abdominal pain.  Patient has a history of colon cancer and has intermittent abdominal pain that she reports.d/d include malignancy, infectious, vascular.  This time she is having abdominal pain along with nausea vomiting and diarrhea the pain is generalized it started last night and she reports that it has been 10 out of 10 sharp not radiating to any other parts of the belly or the back.  Patient also reports loose watery stools no blood and no melena no hematochezia currently or in the past.lactic pending CT shows concern for diarrheal illness, differentials include bacterial/viral gastroenteritis. We will obtain procalcitonin stool cultures. Consider GI consult per a.m. team if symptoms are persistent and unresolving. Supportive care with IV PPI therapy.  Nausea vomiting and diarrhea Acute viral gastroenteritis due to norovirus (stool panel positive for norovirus).  Symptomatic management.  Advance diet as tolerated   AKI (acute kidney injury) ([place]) Lab Results  Component Value Date   CREATININE 1.11 (H) 09/28/2021   CREATININE 1.20 (H) 09/23/2021   CREATININE 0.82 07/01/2021  Due to dehydration from nausea vomiting and diarrhea.  Continue IV hydration and monitor renal function   Colon cancer [facility]) Outpatient follow-up with oncology  Hypertension Continue  amlodipine   Anemia , CBC Latest Ref Rng & Units 09/28/2021 07/01/2021 03/25/2021  WBC 4.0 - 10.5 K/uL 13.4(H) 9.0 7.6  Hemoglobin 12.0 - 15.0 g/dL 12.9 13.0 11.0(L)  Hematocrit 36.0 - 46.0 % 38.0 36.4 31.9(L)  Platelets 150 - 400 K/uL 233 289 219  Stable H&H   Depression Continue Zoloft  Acute gastroenteropathy due to Norovirus Symptomatic management        Subjective: Nauseous but not having any vomiting.  Still having diarrhea.  Feeling very weak and dehydrated.  Not eaten anything yet  Physical Exam: Vitals:   09/28/21 2225 09/29/21 0016 09/29/21 0612 09/29/21 0756  BP: 125/89 (!) 139/91 114/88 114/90  Pulse: 100 (!) 107 91 87  Resp: '18 16 16 16  '$ Temp: 98.2 F (36.8 C) 98.3 F (36.8 C) 97.9 F (36.6 C) 98.1 F (36.7 C)  TempSrc: Oral Oral Oral Oral  SpO2: 100% 100% 100% 99%  Weight:      Height:       71 year old female lying in the bed comfortably without any acute distress Eyes pupil equal round reactive to light and accommodation, no scleral icterus Lungs clear to auscultation bilaterally no wheezing rales rhonchi or crepitation Cardiovascular Swanstrom normal, no murmur rales or Abdomen soft, benign Neuro alert and oriented, nonfocal Skin no rash or lesion Psych normal mood and affect Data Reviewed:  Stool panel positive for norovirus  Family Communication: None  Disposition: Status is: Inpatient Remains inpatient appropriate because: Still very nauseous, not eating food and having diarrhea   Planned Discharge Destination: Home    DVT prophylaxis-subcu heparin Time spent: 35 minutes  Author: Max Sane, MD 09/29/2021 12:10 PM  For on call review [email or web address].

## 2021-09-29 NOTE — Hospital Course (Addendum)
71 y.o. female with medical history significant of colon cancer, anemia, breast cancer, hypertension, depression presenting to the emergency room on 3/19 with nausea vomiting and diarrhea for the previous 24 hours.  Patient brought in for further evaluation and stool panel positive for norovirus.  Symptoms continue to persist being treated accordingly.  By 3/22, patient starting to feel better.  Started on soft bland diet which she tolerated well.  Plan will be for patient to discharge home.

## 2021-09-29 NOTE — Assessment & Plan Note (Signed)
Symptomatic management ?

## 2021-09-29 NOTE — Plan of Care (Signed)
  Problem: Health Behavior/Discharge Planning: Goal: Ability to manage health-related needs will improve Outcome: Progressing   Problem: Clinical Measurements: Goal: Ability to maintain clinical measurements within normal limits will improve Outcome: Progressing   Problem: Nutrition: Goal: Adequate nutrition will be maintained Outcome: Progressing   Problem: Pain Managment: Goal: General experience of comfort will improve Outcome: Progressing   

## 2021-09-30 ENCOUNTER — Inpatient Hospital Stay: Payer: Medicare (Managed Care)

## 2021-09-30 ENCOUNTER — Inpatient Hospital Stay: Payer: Medicare (Managed Care) | Admitting: Oncology

## 2021-09-30 DIAGNOSIS — C184 Malignant neoplasm of transverse colon: Secondary | ICD-10-CM

## 2021-09-30 LAB — BASIC METABOLIC PANEL
Anion gap: 7 (ref 5–15)
BUN: 16 mg/dL (ref 8–23)
CO2: 25 mmol/L (ref 22–32)
Calcium: 8.9 mg/dL (ref 8.9–10.3)
Chloride: 111 mmol/L (ref 98–111)
Creatinine, Ser: 1.16 mg/dL — ABNORMAL HIGH (ref 0.44–1.00)
GFR, Estimated: 50 mL/min — ABNORMAL LOW (ref >60)
Glucose, Bld: 89 mg/dL (ref 70–99)
Potassium: 3.3 mmol/L — ABNORMAL LOW (ref 3.5–5.1)
Sodium: 143 mmol/L (ref 135–145)

## 2021-09-30 LAB — CBC
HCT: 31.4 % — ABNORMAL LOW (ref 36.0–46.0)
Hemoglobin: 10.8 g/dL — ABNORMAL LOW (ref 12.0–15.0)
MCH: 28.1 pg (ref 26.0–34.0)
MCHC: 34.4 g/dL (ref 30.0–36.0)
MCV: 81.6 fL (ref 80.0–100.0)
Platelets: 197 10*3/uL (ref 150–400)
RBC: 3.85 MIL/uL — ABNORMAL LOW (ref 3.87–5.11)
RDW: 13.5 % (ref 11.5–15.5)
WBC: 7.3 10*3/uL (ref 4.0–10.5)
nRBC: 0 % (ref 0.0–0.2)

## 2021-09-30 MED ORDER — POTASSIUM CHLORIDE CRYS ER 20 MEQ PO TBCR
40.0000 meq | EXTENDED_RELEASE_TABLET | Freq: Once | ORAL | Status: AC
  Administered 2021-09-30: 40 meq via ORAL
  Filled 2021-09-30: qty 2

## 2021-09-30 NOTE — Assessment & Plan Note (Signed)
Lab Results  Component Value Date   CREATININE 1.16 (H) 09/30/2021   CREATININE 1.10 (H) 09/28/2021   CREATININE 1.11 (H) 09/28/2021  Due to dehydration from nausea vomiting and diarrhea.  Continue IV hydration and monitor renal function

## 2021-09-30 NOTE — Assessment & Plan Note (Signed)
Continue Zoloft 

## 2021-09-30 NOTE — Progress Notes (Signed)
Progress Note   Patient: Carrie Alvarez [id number] DOB: January 24, 1951 DOA: 09/28/2021     1 DOS: the patient was seen and examined on 09/30/2021   Brief hospital course: 71 y.o. female with medical history significant of colon cancer, anemia, breast cancer, hypertension, depression presenting with nausea vomiting and diarrhea since last night.   3/20 -  Stool panel positive for norovirus 3/21: Not able to eat much.  No appetite.  Continues to have diarrhea and nausea   Assessment and Plan: * Sepsis ([place]) Due to acute viral gastroenteritis.  Continue symptomatic management and IV hydration   Abdominal pain-resolved as of 09/29/2021 Patient presenting with generalized abdominal pain.  Patient has a history of colon cancer and has intermittent abdominal pain that she reports.d/d include malignancy, infectious, vascular.  This time she is having abdominal pain along with nausea vomiting and diarrhea the pain is generalized it started last night and she reports that it has been 10 out of 10 sharp not radiating to any other parts of the belly or the back.  Patient also reports loose watery stools no blood and no melena no hematochezia currently or in the past.lactic pending CT shows concern for diarrheal illness, differentials include bacterial/viral gastroenteritis. We will obtain procalcitonin stool cultures. Consider GI consult per a.m. team if symptoms are persistent and unresolving. Supportive care with IV PPI therapy.  Nausea vomiting and diarrhea Acute viral gastroenteritis due to norovirus (stool panel positive for norovirus).  Symptomatic management.  Advance diet as tolerated   AKI (acute kidney injury) ([place]) Lab Results  Component Value Date   CREATININE 1.16 (H) 09/30/2021   CREATININE 1.10 (H) 09/28/2021   CREATININE 1.11 (H) 09/28/2021  Due to dehydration from nausea vomiting and diarrhea.  Continue IV hydration and monitor renal function   Colon cancer  [facility]) Outpatient follow-up with oncology/Dr. Grayland Ormond  Hypertension Continue amlodipine   Anemia , CBC Latest Ref Rng & Units 09/30/2021 09/28/2021 07/01/2021  WBC 4.0 - 10.5 K/uL 7.3 13.4(H) 9.0  Hemoglobin 12.0 - 15.0 g/dL 10.8(L) 12.9 13.0  Hematocrit 36.0 - 46.0 % 31.4(L) 38.0 36.4  Platelets 150 - 400 K/uL 197 233 289  Stable H&H   Depression Continue Zoloft  Acute gastroenteropathy due to Norovirus Symptomatic management.  She is not having any vomiting.  Still nauseous and having diarrhea.  Does not have appetite and not eating much        Subjective: Very poor appetite, nausea and diarrhea.  She reports 2 loose watery bowel movement this morning.  No vomiting  Physical Exam: Vitals:   09/29/21 1536 09/29/21 2159 09/30/21 0335 09/30/21 0808  BP: 125/88 (!) 133/92 (!) 131/92 129/89  Pulse: 76 83 78 68  Resp: '17 18 18 15  '$ Temp: 98.8 F (37.1 C) 99 F (37.2 C) 97.9 F (36.6 C) 98.2 F (36.8 C)  TempSrc:  Oral    SpO2: 98% 99% 100% 99%  Weight:      Height:       71 year old female lying in the bed comfortably without any acute distress Eyes pupil equal round reactive to light and accommodation, no scleral icterus Lungs clear to auscultation bilaterally no wheezing rales rhonchi or crepitation Cardiovascular Swanstrom normal, no murmur rales or Abdomen soft, benign Neuro alert and oriented, nonfocal Skin no rash or lesion Psych normal mood and affect  Data Reviewed:  K 3.3  Family Communication: None  Disposition: Status is: Inpatient Remains inpatient appropriate because: Ongoing nausea and diarrhea.  Not tolerating diet.Marland Kitchen  On IV hydration   Planned Discharge Destination: Home    DVT prophylaxis-subcu heparin Time spent: 35 minutes  Author: Max Sane, MD 09/30/2021 1:21 PM  For on call review [email or web address].

## 2021-09-30 NOTE — Assessment & Plan Note (Signed)
Symptomatic management.  She is not having any vomiting.  Still nauseous and having diarrhea.  Does not have appetite and not eating much

## 2021-09-30 NOTE — Assessment & Plan Note (Signed)
-   Continue amlodipine ?

## 2021-09-30 NOTE — Assessment & Plan Note (Signed)
Outpatient follow-up with oncology/Dr. Grayland Ormond

## 2021-09-30 NOTE — Assessment & Plan Note (Signed)
Acute viral gastroenteritis due to norovirus (stool panel positive for norovirus).  Symptomatic management.  Advance diet as tolerated

## 2021-09-30 NOTE — Assessment & Plan Note (Signed)
,   CBC Latest Ref Rng & Units 09/30/2021 09/28/2021 07/01/2021  WBC 4.0 - 10.5 K/uL 7.3 13.4(H) 9.0  Hemoglobin 12.0 - 15.0 g/dL 10.8(L) 12.9 13.0  Hematocrit 36.0 - 46.0 % 31.4(L) 38.0 36.4  Platelets 150 - 400 K/uL 197 233 289  Stable H&H

## 2021-09-30 NOTE — Assessment & Plan Note (Signed)
Due to acute viral gastroenteritis.  Continue symptomatic management and IV hydration

## 2021-09-30 NOTE — Research (Signed)
Attestation for [id number] " [name] " Protocol :   Patient was admitted on 09/28/2021 for acute gastroenteritis due to the norovirus. The study requires a 10 day SAE report in [place] as the hospitalization was greater than 24 hours and the patient is within 10 years of protocol treatment completion.   Dr. Grayland Ormond notified via secure chat,  states this is absolutely not related to the [name] study.   Jeral Fruit, RN 09/30/21 10:06 AM

## 2021-10-01 DIAGNOSIS — A419 Sepsis, unspecified organism: Secondary | ICD-10-CM

## 2021-10-01 DIAGNOSIS — E669 Obesity, unspecified: Secondary | ICD-10-CM | POA: Diagnosis present

## 2021-10-01 LAB — BASIC METABOLIC PANEL
Anion gap: 6 (ref 5–15)
BUN: 10 mg/dL (ref 8–23)
CO2: 24 mmol/L (ref 22–32)
Calcium: 9 mg/dL (ref 8.9–10.3)
Chloride: 113 mmol/L — ABNORMAL HIGH (ref 98–111)
Creatinine, Ser: 0.98 mg/dL (ref 0.44–1.00)
GFR, Estimated: >60 mL/min (ref >60)
Glucose, Bld: 87 mg/dL (ref 70–99)
Potassium: 3.5 mmol/L (ref 3.5–5.1)
Sodium: 143 mmol/L (ref 135–145)

## 2021-10-01 LAB — CBC
HCT: 31.3 % — ABNORMAL LOW (ref 36.0–46.0)
Hemoglobin: 11 g/dL — ABNORMAL LOW (ref 12.0–15.0)
MCH: 28 pg (ref 26.0–34.0)
MCHC: 35.1 g/dL (ref 30.0–36.0)
MCV: 79.6 fL — ABNORMAL LOW (ref 80.0–100.0)
Platelets: 195 10*3/uL (ref 150–400)
RBC: 3.93 MIL/uL (ref 3.87–5.11)
RDW: 13.2 % (ref 11.5–15.5)
WBC: 7.5 10*3/uL (ref 4.0–10.5)
nRBC: 0 % (ref 0.0–0.2)

## 2021-10-01 LAB — H. PYLORI ANTIGEN, STOOL: H. Pylori Stool Ag, Eia: NEGATIVE

## 2021-10-01 MED ORDER — PROMETHAZINE HCL 25 MG PO TABS: 25.0000 mg | ORAL_TABLET | Freq: Three times a day (TID) | ORAL | 0 refills | Status: DC | PRN

## 2021-10-01 MED ORDER — ONDANSETRON HCL 4 MG PO TABS: 4.0000 mg | ORAL_TABLET | Freq: Three times a day (TID) | ORAL | 0 refills | Status: DC | PRN

## 2021-10-01 NOTE — Assessment & Plan Note (Signed)
Patient presenting with generalized abdominal pain.  Patient has a history of colon cancer and has intermittent abdominal pain that she reports.d/d include malignancy, infectious, vascular.  This time she is having abdominal pain along with nausea vomiting and diarrhea the pain is generalized it started last night and she reports that it has been 10 out of 10 sharp not radiating to any other parts of the belly or the back.  Patient also reports loose watery stools no blood and no melena no hematochezia currently or in the past.lactic pending CT shows concern for diarrheal illness, differentials include bacterial/viral gastroenteritis. We will obtain procalcitonin stool cultures. Consider GI consult per a.m. team if symptoms are persistent and unresolving. Supportive care with IV PPI therapy.

## 2021-10-01 NOTE — Assessment & Plan Note (Signed)
,      Latest Ref Rng & Units 10/01/2021    4:28 AM 09/30/2021    5:07 AM 09/28/2021    4:20 PM  CBC  WBC 4.0 - 10.5 K/uL 7.5   7.3   13.4    Hemoglobin 12.0 - 15.0 g/dL 11.0   10.8   12.9    Hematocrit 36.0 - 46.0 % 31.3   31.4   38.0    Platelets 150 - 400 K/uL 195   197   233    Stable H&H

## 2021-10-01 NOTE — Assessment & Plan Note (Signed)
Acute viral gastroenteritis due to norovirus (stool panel positive for norovirus).  Symptomatic management.  Advance diet as tolerated

## 2021-10-01 NOTE — Assessment & Plan Note (Signed)
-   Continue amlodipine ?

## 2021-10-01 NOTE — Assessment & Plan Note (Signed)
Continue Zoloft 

## 2021-10-01 NOTE — Care Management (Signed)
  Transition of Care [facility]) Screening Note   Patient Details  Name: Carrie Alvarez Date of Birth: [birth year]   Transition of Care [facility]) CM/SW Contact:    Pete Pelt, RN Phone Number: 10/01/2021, 2:39 PM    Transition of Care Department [facility]) has reviewed patient and no TOC needs have been identified at this time. We will continue to monitor patient advancement through interdisciplinary progression rounds. If new patient transition needs arise, please place a TOC consult.

## 2021-10-01 NOTE — Assessment & Plan Note (Signed)
Outpatient follow-up with oncology/Dr. Grayland Ormond

## 2021-10-01 NOTE — Discharge Summary (Addendum)
Physician Discharge Summary   Patient: Carrie Alvarez MRN: [id number] DOB: 1951-06-11  Admit date:     09/28/2021  Discharge date: 10/01/21  Discharge Physician: Annita Brod   PCP: Birdie Sons, MD   Recommendations at discharge:    Patient received refills on as needed Phenergan for nausea and vomiting and as needed Zofran for refractory nausea and vomiting Patient will follow-up with her PCP in several weeks  Discharge Diagnoses: Principal Problem:   Sepsis ([place]) Active Problems:   Acute gastroenteropathy due to Norovirus   Hypertension   Colon cancer ([place])   Anemia   Obesity (BMI 30-39.9)   Depression  Resolved Problems:   AKI (acute kidney injury) [facility])  Hospital Course: 71 y.o. female with medical history significant of colon cancer, anemia, breast cancer, hypertension, depression presenting to the emergency room on 3/19 with nausea vomiting and diarrhea for the previous 24 hours.  Patient brought in for further evaluation and stool panel positive for norovirus.  Symptoms continue to persist being treated accordingly.  By 3/22, patient starting to feel better.  Started on soft bland diet which she tolerated well.  Plan will be for patient to discharge home.  Assessment and Plan: * Sepsis ([place]) Due to acute viral gastroenteritis.  Met criteria secondary to tachypnea and tachycardia.  Continue symptomatic management and IV hydration.  Sepsis has since resolved.   Acute gastroenteropathy due to Norovirus Symptomatic management.  She is not having any vomiting.  By 3/22, diarrhea had somewhat improved.  Nausea had resolved and patient was able to tolerate soft bland diet.  At that point, patient felt she was getting better and could manage her symptoms at home.  Colon cancer [place]) Outpatient follow-up with oncology/Dr. Grayland Ormond  Hypertension Continue amlodipine   AKI (acute kidney injury) (HCC)-resolved as of 10/01/2021 Lab Results  Component Value  Date   CREATININE 0.98 10/01/2021   CREATININE 1.16 (H) 09/30/2021   CREATININE 1.10 (H) 09/28/2021  Due to dehydration from nausea vomiting and diarrhea.  By 3/22, renal function at baseline.  Anemia ,    Latest Ref Rng & Units 10/01/2021    4:28 AM 09/30/2021    5:07 AM 09/28/2021    4:20 PM  CBC  WBC 4.0 - 10.5 K/uL 7.5   7.3   13.4    Hemoglobin 12.0 - 15.0 g/dL 11.0   10.8   12.9    Hematocrit 36.0 - 46.0 % 31.3   31.4   38.0    Platelets 150 - 400 K/uL 195   197   233    Stable H&H   Obesity (BMI 30-39.9) Patient meets criteria BMI greater than 30  Depression Continue Zoloft        Consultants: None Procedures performed: None Disposition: Home Diet recommendation:  Discharge Diet Orders (From admission, onward)     Start     Ordered   10/01/21 0000  Diet - low sodium heart healthy        10/01/21 1621           Discharge diet for soft bland diet at first and then back to low-sodium DISCHARGE MEDICATION: Allergies as of 10/01/2021   No Known Allergies      Medication List     TAKE these medications    acetaminophen 500 MG tablet Commonly known as: TYLENOL Take 500 mg by mouth every 6 (six) hours as needed for mild pain or moderate pain.   albuterol 108 (90 Base) MCG/ACT inhaler  Commonly known as: VENTOLIN HFA Inhale 2 puffs into the lungs every 6 (six) hours as needed for wheezing or shortness of breath.   amLODipine 5 MG tablet Commonly known as: NORVASC Take 1 tablet (5 mg total) by mouth daily.   atorvastatin 80 MG tablet Commonly known as: LIPITOR Take 1 tablet (80 mg total) by mouth daily.   baclofen 10 MG tablet Commonly known as: LIORESAL Take 1 tablet (10 mg total) by mouth 2 (two) times daily.   cetirizine 10 MG tablet Commonly known as: ZYRTEC Take 10 mg by mouth daily at 6 (six) AM.   cholecalciferol 1000 units tablet Commonly known as: VITAMIN D Take 1,000 Units by mouth daily.   fluticasone 50 MCG/ACT nasal  spray Commonly known as: FLONASE Use 2 spray(s) in each nostril once daily   gabapentin 300 MG capsule Commonly known as: NEURONTIN Take 300-600 mg by mouth 4 (four) times daily as needed (neuropathic pain).   HYDROcodone-acetaminophen 5-325 MG tablet Commonly known as: Norco Take 1 tablet by mouth every 4 (four) hours as needed for moderate pain.   letrozole 2.5 MG tablet Commonly known as: FEMARA Take 2.5 mg by mouth daily.   Narcan 4 MG/0.1ML Liqd nasal spray kit Generic drug: naloxone Place 1 spray into the nose.   ondansetron 4 MG tablet Commonly known as: Zofran Take 1 tablet (4 mg total) by mouth every 8 (eight) hours as needed for up to 20 doses for refractory nausea / vomiting. What changed: reasons to take this   Oxycodone HCl 10 MG Tabs LIMIT ONE HALF TO ONE TABLET BY MOUTH 3 TO 5 TIMES DAILY IF TOLERATED   PreviDent 5000 Dry Mouth 1.1 % Gel dental gel Generic drug: sodium fluoride Place onto teeth at bedtime.   promethazine 25 MG tablet Commonly known as: PHENERGAN Take 1 tablet (25 mg total) by mouth every 8 (eight) hours as needed for nausea or vomiting.   pyridoxine 100 MG tablet Commonly known as: B-6 Take 100 mg by mouth daily.   sertraline 100 MG tablet Commonly known as: ZOLOFT Take 2 tablets by mouth once daily   vitamin B-12 500 MCG tablet Commonly known as: CYANOCOBALAMIN Take 500 mcg by mouth daily.        Discharge Exam: Filed Weights   09/28/21 1619  Weight: 90.7 kg   General: Alert and oriented x3, no acute distress Cardiovascular: Regular rate and rhythm, S1-S2 Lungs: Clear to auscultation bilaterally  Condition at discharge: good  The results of significant diagnostics from this hospitalization (including imaging, microbiology, ancillary and laboratory) are listed below for reference.   Imaging Studies: DG Chest 2 View  Result Date: 09/28/2021 CLINICAL DATA:  Nausea, vomiting, diarrhea EXAM: CHEST - 2 VIEW COMPARISON:   03/19/2021 FINDINGS: Patchy pulmonary infiltrate has developed within the lung bases bilaterally, new since prior CT examination of the abdomen pelvis of 09/23/2021, but similar to that noted on prior chest radiograph of 03/19/2021, likely infectious or inflammatory in nature. No pneumothorax or pleural effusion. Cardiac size is within normal limits. The thoracic aorta is tortuous and aneurysmal, but unchanged in contour since prior examination. Pulmonary vascularity is normal. No acute bone abnormality. IMPRESSION: Interval development of patchy bibasilar pulmonary infiltrate, likely infectious or inflammatory. Grossly stable tortuosity and aneurysm of the thoracic aorta Electronically Signed   By: Fidela Salisbury M.D.   On: 09/28/2021 20:21   CT ABDOMEN PELVIS W CONTRAST  Result Date: 09/28/2021 CLINICAL DATA:  Acute nonlocalized abdominal pain. Nausea, vomiting,  diarrhea since this morning. EXAM: CT ABDOMEN AND PELVIS WITH CONTRAST TECHNIQUE: Multidetector CT imaging of the abdomen and pelvis was performed using the standard protocol following bolus administration of intravenous contrast. RADIATION DOSE REDUCTION: This exam was performed according to the departmental dose-optimization program which includes automated exposure control, adjustment of the mA and/or kV according to patient size and/or use of iterative reconstruction technique. CONTRAST:  128m OMNIPAQUE IOHEXOL 300 MG/ML  SOLN COMPARISON:  CT 5 days ago 09/23/2021 FINDINGS: Lower chest: Patchy ground-glass and nodular opacities in the right lower and right middle lobe, new from recent CT. No pleural fluid. Breathing motion artifact partially obscures detailed assessment. Hepatobiliary: No focal liver abnormality is seen. Status post cholecystectomy. No biliary dilatation. Pancreas: No ductal dilatation or inflammation. No evidence of pancreatic mass. Spleen: Normal in size without focal abnormality. Adrenals/Urinary Tract: Normal adrenal glands.  No hydronephrosis or perinephric edema. Homogeneous renal enhancement with symmetric excretion on delayed phase imaging. No visualized renal stone or focal renal lesion. Urinary bladder is physiologically distended without wall thickening. Stomach/Bowel: Small hiatal hernia. The distal esophagus is patulous. The stomach is nondistended. No small bowel obstruction or inflammation. Right hemicolectomy with surgical anastomosis in the mid transverse colon. No associated colonic wall thickening. There is fluid within the remainder of the colon. No pericolonic edema or associated wall thickening. Vascular/Lymphatic: Aortic atherosclerosis. Mild bi-iliac tortuosity. No aortic aneurysm. Patent portal and mesenteric veins. No abdominopelvic adenopathy. Reproductive: Status post hysterectomy. No adnexal masses. Other: No free air, ascites, or focal fluid collection. There is a tiny fat containing umbilical hernia. Musculoskeletal: Grade 1 anterolisthesis of L4 on L5 with associated facet hypertrophy and degenerative disc disease no focal bone lesion or acute osseous findings. IMPRESSION: 1. Fluid within the colon, suggestive of diarrheal illness. No bowel inflammation or obstruction. 2. Patchy ground-glass and nodular opacities in the right lower and right middle lobe, new from recent CT, likely infectious or inflammatory bronchiolitis. 3. Small hiatal hernia. Aortic Atherosclerosis (ICD10-I70.0). Electronically Signed   By: MKeith RakeM.D.   On: 09/28/2021 18:35   CT CHEST ABDOMEN PELVIS W CONTRAST  Result Date: 09/23/2021 CLINICAL DATA:  Colorectal cancer, surveillance. EXAM: CT CHEST, ABDOMEN, AND PELVIS WITH CONTRAST TECHNIQUE: Multidetector CT imaging of the chest, abdomen and pelvis was performed following the standard protocol during bolus administration of intravenous contrast. RADIATION DOSE REDUCTION: This exam was performed according to the departmental dose-optimization program which includes automated  exposure control, adjustment of the mA and/or kV according to patient size and/or use of iterative reconstruction technique. CONTRAST:  1055mOMNIPAQUE IOHEXOL 300 MG/ML  SOLN COMPARISON:  Multiple priors including most recent CT March 20, 2021 July 01, 2021. FINDINGS: CT CHEST FINDINGS Cardiovascular: Aortic atherosclerosis. No central embolus on this nondedicated study. Normal size heart. No significant pericardial effusion/thickening. Mediastinum/Nodes: No supraclavicular adenopathy. No discrete thyroid nodule. No pathologically enlarged mediastinal hilar or axillary lymph nodes. Patulous esophagus with retained versus refluxed contrast in the esophagus. Lungs/Pleura: No suspicious nodules or masses. Mild diffuse bronchial wall thickening. No pleural effusion. No pneumothorax. Musculoskeletal: Multilevel degenerative changes spine. No aggressive lytic or blastic lesion of bone. No suspicious chest wall mass. CT ABDOMEN PELVIS FINDINGS Hepatobiliary: No focal liver abnormality is seen. Status post cholecystectomy. No biliary dilatation. Pancreas: No pancreatic ductal dilatation or surrounding inflammatory changes. Spleen: Normal in size without focal abnormality. Adrenals/Urinary Tract: Adrenal glands are unremarkable. Kidneys are normal, without renal calculi, solid enhancing lesion, or hydronephrosis. Bladder is unremarkable. Stomach/Bowel: Small hiatal hernia. Stomach  is otherwise unremarkable for degree of distension. No pathologic dilation of small or large bowel. Similar changes of right hemicolectomy. Scattered colonic diverticulosis. Moderate volume of formed stool in the colon. Vascular/Lymphatic: Aortic atherosclerosis without aneurysmal dilation. No pathologically enlarged abdominal or pelvic lymph nodes. Reproductive: Status post hysterectomy. No adnexal masses. Other: No significant abdominopelvic free fluid. No discrete peritoneal or omental nodularity. Musculoskeletal: No suspicious bone  lesions identified. Multilevel degenerative changes spine with degenerative grade 1 anterolisthesis of L4 on L5. IMPRESSION: 1. Stable examination without new or progressive findings to suggest local recurrence or metastatic disease within the chest, abdomen, or pelvis. 2. Small hiatal hernia with patulous esophagus and retained versus refluxed contrast in the esophagus, may reflect esophageal reflux. 3. Aortic Atherosclerosis (ICD10-I70.0). Electronically Signed   By: Dahlia Bailiff M.D.   On: 09/23/2021 11:50    Microbiology: Results for orders placed or performed during the hospital encounter of 09/28/21  Resp Panel by RT-PCR (Flu A&B, Covid) Nasopharyngeal Swab     Status: None   Collection Time: 09/28/21  4:26 PM   Specimen: Nasopharyngeal Swab; Nasopharyngeal(NP) swabs in vial transport medium  Result Value Ref Range Status   SARS Coronavirus 2 by RT PCR NEGATIVE NEGATIVE Final    Comment: (NOTE) SARS-CoV-2 target nucleic acids are NOT DETECTED.  The SARS-CoV-2 RNA is generally detectable in upper respiratory specimens during the acute phase of infection. The lowest concentration of SARS-CoV-2 viral copies this assay can detect is 138 copies/mL. A negative result does not preclude SARS-Cov-2 infection and should not be used as the sole basis for treatment or other patient management decisions. A negative result may occur with  improper specimen collection/handling, submission of specimen other than nasopharyngeal swab, presence of viral mutation(s) within the areas targeted by this assay, and inadequate number of viral copies(<138 copies/mL). A negative result must be combined with clinical observations, patient history, and epidemiological information. The expected result is Negative.  Fact Sheet for Patients:  EntrepreneurPulse.com.au  Fact Sheet for Healthcare Providers:  IncredibleEmployment.be  This test is no t yet approved or cleared by  the [place] FDA and  has been authorized for detection and/or diagnosis of SARS-CoV-2 by FDA under an Emergency Use Authorization (EUA). This EUA will remain  in effect (meaning this test can be used) for the duration of the COVID-19 declaration under Section 564(b)(1) of the Act, 21 U.S.C.section 360bbb-3(b)(1), unless the authorization is terminated  or revoked sooner.       Influenza A by PCR NEGATIVE NEGATIVE Final   Influenza B by PCR NEGATIVE NEGATIVE Final    Comment: (NOTE) The Xpert Xpress SARS-CoV-2/FLU/RSV plus assay is intended as an aid in the diagnosis of influenza from Nasopharyngeal swab specimens and should not be used as a sole basis for treatment. Nasal washings and aspirates are unacceptable for Xpert Xpress SARS-CoV-2/FLU/RSV testing.  Fact Sheet for Patients: EntrepreneurPulse.com.au  Fact Sheet for Healthcare Providers: IncredibleEmployment.be  This test is not yet approved or cleared by the [place] FDA and has been authorized for detection and/or diagnosis of SARS-CoV-2 by FDA under an Emergency Use Authorization (EUA). This EUA will remain in effect (meaning this test can be used) for the duration of the COVID-19 declaration under Section 564(b)(1) of the Act, 21 U.S.C. section 360bbb-3(b)(1), unless the authorization is terminated or revoked.  Performed at [facility], [address] [place], [place] [address]   Culture, blood (x 2)     Status: None (Preliminary result)   Collection  Time: 09/28/21 11:44 PM   Specimen: BLOOD  Result Value Ref Range Status   Specimen Description BLOOD BLOOD RIGHT HAND  Final   Special Requests   Final    BOTTLES DRAWN AEROBIC AND ANAEROBIC Blood Culture results may not be optimal due to an inadequate volume of blood received in culture bottles   Culture   Final    NO GROWTH 3 DAYS Performed at [facility], [address] [place], [place]  [address]    Report Status PENDING  Incomplete  Culture, blood (Routine X 2) w Reflex to ID Panel     Status: None (Preliminary result)   Collection Time: 09/29/21  3:40 AM   Specimen: BLOOD  Result Value Ref Range Status   Specimen Description BLOOD RIGHT HAND  Final   Special Requests   Final    BOTTLES DRAWN AEROBIC AND ANAEROBIC Blood Culture adequate volume   Culture   Final    NO GROWTH 2 DAYS Performed at [facility], [address] [place], [place] [address]    Report Status PENDING  Incomplete  Gastrointestinal Panel by PCR , Stool     Status: Abnormal   Collection Time: 09/29/21  7:53 AM   Specimen: Stool  Result Value Ref Range Status   Campylobacter species NOT DETECTED NOT DETECTED Final   Plesimonas shigelloides NOT DETECTED NOT DETECTED Final   Salmonella species NOT DETECTED NOT DETECTED Final   Yersinia enterocolitica NOT DETECTED NOT DETECTED Final   Vibrio species NOT DETECTED NOT DETECTED Final   Vibrio cholerae NOT DETECTED NOT DETECTED Final   Enteroaggregative E coli (EAEC) NOT DETECTED NOT DETECTED Final   Enteropathogenic E coli (EPEC) NOT DETECTED NOT DETECTED Final   Enterotoxigenic E coli (ETEC) NOT DETECTED NOT DETECTED Final   Shiga like toxin producing E coli (STEC) NOT DETECTED NOT DETECTED Final   Shigella/Enteroinvasive E coli (EIEC) NOT DETECTED NOT DETECTED Final   Cryptosporidium NOT DETECTED NOT DETECTED Final   Cyclospora cayetanensis NOT DETECTED NOT DETECTED Final   Entamoeba histolytica NOT DETECTED NOT DETECTED Final   Giardia lamblia NOT DETECTED NOT DETECTED Final   Adenovirus F40/41 NOT DETECTED NOT DETECTED Final   Astrovirus NOT DETECTED NOT DETECTED Final   Norovirus GI/GII DETECTED (A) NOT DETECTED Final    Comment: RESULT CALLED TO, READ BACK BY AND VERIFIED WITH: Gwendolyn Lima RN [phone] 09/29/21 HNM    Rotavirus A NOT DETECTED NOT DETECTED Final   Sapovirus (I, II, IV, and V) NOT DETECTED NOT DETECTED Final    Comment:  Performed at [facility], [address] [place], [place] [address]    Labs: CBC: Recent Labs  Lab 09/28/21 1620 09/30/21 0507 10/01/21 0428  WBC 13.4* 7.3 7.5  HGB 12.9 10.8* 11.0*  HCT 38.0 31.4* 31.3*  MCV 82.8 81.6 79.6*  PLT 233 197 993   Basic Metabolic Panel: Recent Labs  Lab 09/28/21 1620 09/28/21 2344 09/30/21 0507 10/01/21 0428  NA 140 139 143 143  K 3.6 3.6 3.3* 3.5  CL 108 107 111 113*  CO2 _0 GLUCOSE 113* 107* 89 87  BUN _1 CREATININE 1.11* 1.10* 1.16* 0.98  CALCIUM 9.7 9.1 8.9 9.0   Liver Function Tests: Recent Labs  Lab 09/28/21 1620 09/28/21 2344  AST 25 22  ALT 18 17  ALKPHOS 75 66  BILITOT 1.2 1.0  PROT 7.7 7.6  ALBUMIN 4.1 3.8   CBG: Recent Labs  Lab 09/28/21 2228 09/29/21 0815 09/29/21 1206 09/29/21 1709  GLUCAP 94 98 97 102*    Discharge time spent: less than 30 minutes.  Signed: Annita Brod, MD [facility] 10/01/2021

## 2021-10-01 NOTE — Assessment & Plan Note (Signed)
Patient meets criteria BMI greater than 30 

## 2021-10-01 NOTE — Assessment & Plan Note (Addendum)
Symptomatic management.  She is not having any vomiting.  By 3/22, diarrhea had somewhat improved.  Nausea had resolved and patient was able to tolerate soft bland diet.  At that point, patient felt she was getting better and could manage her symptoms at home.

## 2021-10-01 NOTE — Assessment & Plan Note (Addendum)
Lab Results  Component Value Date   CREATININE 0.98 10/01/2021   CREATININE 1.16 (H) 09/30/2021   CREATININE 1.10 (H) 09/28/2021  Due to dehydration from nausea vomiting and diarrhea.  By 3/22, renal function at baseline.

## 2021-10-01 NOTE — Assessment & Plan Note (Signed)
Due to acute viral gastroenteritis.  Met criteria secondary to tachypnea and tachycardia.  Continue symptomatic management and IV hydration.  Sepsis has since resolved.  

## 2021-10-02 ENCOUNTER — Telehealth: Payer: Self-pay

## 2021-10-02 NOTE — Telephone Encounter (Signed)
Transition Care Management Follow-up Telephone Call Date of discharge and from where: [facility] 3/22 How have you been since you were released from the hospital? WEAK Any questions or concerns? No  Items Reviewed: Did the pt receive and understand the discharge instructions provided? Yes  Medications obtained and verified? Yes  Other? No  Any new allergies since your discharge? No  Dietary orders reviewed? No Do you have support at home? Yes   Home Care and Equipment/Supplies: Were home health services ordered? no I  Functional Questionnaire: (I = Independent and D = Dependent) ADLs: I  Bathing/Dressing- I  Meal Prep- I  Eating- I  Maintaining continence- I  Transferring/Ambulation- I  Managing Meds- I  Follow up appointments reviewed:  PCP Hospital f/u appt confirmed? Yes  Scheduled to see Dr.Fisher on 4/17 @ 3:40. Are transportation arrangements needed? No  If their condition worsens, is the pt aware to call PCP or go to the Emergency Dept.? Yes Was the patient provided with contact information for the PCP's office or ED? Yes Was to pt encouraged to call back with questions or concerns? Yes

## 2021-10-03 LAB — CULTURE, BLOOD (ROUTINE X 2): Culture: NO GROWTH

## 2021-10-03 NOTE — Telephone Encounter (Signed)
Hospital follow up visits are required within 14 days of discharge. You can now use SameDay slots for hospital follow ups. Please reschedule pt to be seen before April 5. Thanks!

## 2021-10-04 LAB — CULTURE, BLOOD (ROUTINE X 2)
Culture: NO GROWTH
Special Requests: ADEQUATE

## 2021-10-06 ENCOUNTER — Telehealth: Payer: Self-pay

## 2021-10-06 NOTE — Telephone Encounter (Signed)
LVM- Need to reschedule hospital fu before 4/5- can use same day appt per Dr.Fisher

## 2021-10-07 ENCOUNTER — Other Ambulatory Visit: Payer: Self-pay

## 2021-10-07 ENCOUNTER — Inpatient Hospital Stay: Payer: Medicare (Managed Care) | Admitting: Oncology

## 2021-10-07 ENCOUNTER — Encounter: Payer: Self-pay | Admitting: Oncology

## 2021-10-07 ENCOUNTER — Inpatient Hospital Stay: Payer: Medicare (Managed Care) | Attending: Nurse Practitioner

## 2021-10-07 VITALS — BP 110/85 | HR 83 | Temp 96.3°F | Resp 16 | Ht 62.0 in | Wt 204.0 lb

## 2021-10-07 DIAGNOSIS — G620 Drug-induced polyneuropathy: Secondary | ICD-10-CM | POA: Insufficient documentation

## 2021-10-07 DIAGNOSIS — C184 Malignant neoplasm of transverse colon: Secondary | ICD-10-CM | POA: Diagnosis not present

## 2021-10-07 DIAGNOSIS — E876 Hypokalemia: Secondary | ICD-10-CM | POA: Insufficient documentation

## 2021-10-07 DIAGNOSIS — Z006 Encounter for examination for normal comparison and control in clinical research program: Secondary | ICD-10-CM | POA: Diagnosis not present

## 2021-10-07 DIAGNOSIS — T451X5A Adverse effect of antineoplastic and immunosuppressive drugs, initial encounter: Secondary | ICD-10-CM | POA: Insufficient documentation

## 2021-10-07 DIAGNOSIS — Z853 Personal history of malignant neoplasm of breast: Secondary | ICD-10-CM | POA: Insufficient documentation

## 2021-10-07 DIAGNOSIS — Z8542 Personal history of malignant neoplasm of other parts of uterus: Secondary | ICD-10-CM | POA: Insufficient documentation

## 2021-10-07 DIAGNOSIS — Z1509 Genetic susceptibility to other malignant neoplasm: Secondary | ICD-10-CM | POA: Insufficient documentation

## 2021-10-07 DIAGNOSIS — Z9221 Personal history of antineoplastic chemotherapy: Secondary | ICD-10-CM | POA: Insufficient documentation

## 2021-10-07 DIAGNOSIS — Z90710 Acquired absence of both cervix and uterus: Secondary | ICD-10-CM | POA: Insufficient documentation

## 2021-10-07 DIAGNOSIS — C189 Malignant neoplasm of colon, unspecified: Secondary | ICD-10-CM | POA: Diagnosis present

## 2021-10-07 DIAGNOSIS — D649 Anemia, unspecified: Secondary | ICD-10-CM | POA: Insufficient documentation

## 2021-10-07 LAB — COMPREHENSIVE METABOLIC PANEL
ALT: 23 U/L (ref 0–44)
AST: 24 U/L (ref 15–41)
Albumin: 3.4 g/dL — ABNORMAL LOW (ref 3.5–5.0)
Alkaline Phosphatase: 66 U/L (ref 38–126)
Anion gap: 5 (ref 5–15)
BUN: 11 mg/dL (ref 8–23)
CO2: 28 mmol/L (ref 22–32)
Calcium: 8.9 mg/dL (ref 8.9–10.3)
Chloride: 108 mmol/L (ref 98–111)
Creatinine, Ser: 1.09 mg/dL — ABNORMAL HIGH (ref 0.44–1.00)
GFR, Estimated: 54 mL/min — ABNORMAL LOW (ref >60)
Glucose, Bld: 116 mg/dL — ABNORMAL HIGH (ref 70–99)
Potassium: 3.3 mmol/L — ABNORMAL LOW (ref 3.5–5.1)
Sodium: 141 mmol/L (ref 135–145)
Total Bilirubin: 0.4 mg/dL (ref 0.3–1.2)
Total Protein: 6.6 g/dL (ref 6.5–8.1)

## 2021-10-07 LAB — CBC WITH DIFFERENTIAL/PLATELET
Abs Immature Granulocytes: 0.06 10*3/uL (ref 0.00–0.07)
Basophils Absolute: 0 10*3/uL (ref 0.0–0.1)
Basophils Relative: 1 %
Eosinophils Absolute: 0.2 10*3/uL (ref 0.0–0.5)
Eosinophils Relative: 2 %
HCT: 32.4 % — ABNORMAL LOW (ref 36.0–46.0)
Hemoglobin: 11.3 g/dL — ABNORMAL LOW (ref 12.0–15.0)
Immature Granulocytes: 1 %
Lymphocytes Relative: 22 %
Lymphs Abs: 1.7 10*3/uL (ref 0.7–4.0)
MCH: 28.8 pg (ref 26.0–34.0)
MCHC: 34.9 g/dL (ref 30.0–36.0)
MCV: 82.4 fL (ref 80.0–100.0)
Monocytes Absolute: 0.5 10*3/uL (ref 0.1–1.0)
Monocytes Relative: 6 %
Neutro Abs: 5.2 10*3/uL (ref 1.7–7.7)
Neutrophils Relative %: 68 %
Platelets: 246 10*3/uL (ref 150–400)
RBC: 3.93 MIL/uL (ref 3.87–5.11)
RDW: 14 % (ref 11.5–15.5)
WBC: 7.6 10*3/uL (ref 4.0–10.5)
nRBC: 0 % (ref 0.0–0.2)

## 2021-10-07 NOTE — Research (Addendum)
RANDOMIZED TRIAL OF STANDARD CHEMOTHERAPY ALONE OR COMBINED WITH ATEZOLIZUMAB AS ADJUVANT THERAPY FOR PATIENTS WITH STAGE III COLON CANCER AND DEFICIENT DNA MISMATCH REPAIR   Patient in clinic this morning for her 30 month study follow up visit on the [id number] [name] study.  I met with patient, here alone, in exam room and prior to her visit with Dr. Grayland Ormond. I re-introduced myself to the patient as it has been 6 months since her last visit. Patient states she has recently been in the hospital with the norovirus. Denies any further symptoms from that, states she just needs to get her energy back again.  See other AE's listed below.   Labs: CEA collected and result is pending. H&P:  Patient met with Dr. Grayland Ormond who completed patient visit and answered patient's questions. Patient's v/s and weight collected  (see flow sheet).  AE's: Patient states she has ongoing fatigue, that continues most days, but is able to complete her daily ADL's without much difficulty. Patient denies; diarrhea, constipation, nausea, fever, UTI, abdominal pain. Patient does complain of bilateral lower extremity peripheral neuropathy, unchanged since her last visit. States this does not interfere with her ADL's, she adjusts to it. States it feels like the bottom of her feet are swollen, like she's walking on something weird. See additional solicited and other Adverse events with grade and attribution noted below. CT/MRI: CT completed 09/28/2021, Dr. Grayland Ormond reviewed results with patient.   Plan: Patient will be scheduled to return to clinic for lab (CEA) and adverse events assessment 75-monthvisit. Dr. FGrayland Ormondinformed of the research 36 month follow up requirements with the CT C/A/P in 6 months. Patient denied having any questions at this time. Patient thanked for her time and encouraged to call with questions or concerns.   Adverse Event Log  Study/Protocol: [name] Cycle: 30 Month Visit Event Grade Onset Date  Resolved Date Drug Name Attribution Treatment Comments  Diarrhea 0        Constipation 0        Nausea 0     unrelated    Fatigue 1 01/01/2020  09/05/2020    possible    Fatigue 1 09/28/2021 ongoing  Unrelated  Recent hospitalization with norovirus- SAE reported   Anorexia 1 07/13/2008 09/05/2020  unrelated    Cough 1 08/26/2020 10/07/2021  Unrelated  States due to bronchitis, treatment completed  Dyspnea 1 08/26/2020 10/07/2021  unrelated  Due to bronchitis, treatment completed.  Fever 0        UTI 0        Palmer-plantar erythrodesia 0        Total biliruben 0        Neutrophils 0        TSH 0      Not checked 03/25/21  Sensory Peripheral Neuropathy 1 07/11/2019  ongoing   unrelated Gabapentin - dose recently increased    Anemia 1 01/01/2020  02/26/2020     Recovered: Hgb 12.7 today  Anemia  1 03/25/21 ongoing    Hgb 11.0  Elevated creatinine 1 11/20/2019  09/05/2020  unrelated    Elevated Creatinine 1 03/25/21     1.06  Abdominal pain 0          Chronic back & left leg pain 2 07/13/2017 ongoing  unrelated    Pain- left side extremities 2 03/19/21 10/07/2021  unrelated     SJeral Fruit RN 10/07/2021 10:32 AM

## 2021-10-07 NOTE — Progress Notes (Signed)
[place]  [phone] [phone]  ID: Norm Salt OB: [birth year]  MR#: [id number]  ASN#:[id number]  Patient Care Team: Birdie Sons, MD as PCP - General (Family Medicine) [name], MD as Consulting Physician (Oncology) [name], MD as Attending Physician (Pain Medicine) [name]., MD as Consulting Physician (Ophthalmology) [name], RN as Oncology Nurse Navigator [name], [name], MD as Referring Physician (Anesthesiology)  CHIEF COMPLAINT: Stage IIa ER+ left breast cancer with no breast lesion and axillary lymph node metastasis.  Now with stage IIIa colon cancer.  INTERVAL HISTORY: Patient returns to clinic today for routine 62-monthevaluation and discussion of her imaging and laboratory results.  She was recently in the hospital with adenovirus and has persistent weakness and fatigue from that admission.  Her GI symptoms have resolved.  She does not complain of back pain today.  She has no neurologic complaints.  She denies any fevers.  She has no chest pain, shortness of breath, cough, or hemoptysis.  She denies any current nausea, vomiting, constipation, or diarrhea.  She has no urinary complaints.  Patient offers no further specific complaints today.    REVIEW OF SYSTEMS:   Review of Systems  Constitutional:  Positive for malaise/fatigue. Negative for fever and weight loss.  Respiratory: Negative.  Negative for cough and shortness of breath.   Cardiovascular: Negative.  Negative for chest pain and leg swelling.  Gastrointestinal: Negative.  Negative for abdominal pain, blood in stool, constipation, diarrhea, melena, nausea and vomiting.  Genitourinary: Negative.  Negative for dysuria.  Musculoskeletal: Negative.  Negative for back pain, falls and neck pain.  Skin: Negative.  Negative for rash.  Neurological:  Positive for tingling, sensory change and weakness. Negative for dizziness, focal  weakness and headaches.  Psychiatric/Behavioral: Negative.  Negative for depression. The patient is not nervous/anxious.    As per HPI. Otherwise, a complete review of systems is negative.  PAST MEDICAL HISTORY: Past Medical History:  Diagnosis Date   Allergy    Anemia    Arthritis    Back pain    [place]   LT LUMPECTOMY 12-2014 FOLLOWING CHEMO   Carpal tunnel syndrome    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   Cataract    Chronic pain    [place]   Depression    Dyspnea    Family history of breast cancer    Family history of colon cancer    GERD (gastroesophageal reflux disease)    problem with reflux during chemo   History of methicillin resistant staphylococcus aureus (MRSA)    Hypercholesteremia    Hypertension    Lumbosacral pain    sees pain management in [place]   Obesity    Personal history of chemotherapy 2016   left breast ca   Personal history of chemotherapy 2020   colon ca   Pinched nerve    left side, lumbar area   [place]    PAST SURGICAL HISTORY: Past Surgical History:  Procedure Laterality Date   ABDOMINAL HYSTERECTOMY  1994   UTERINE CA   AXILLARY LYMPH NODE BIOPSY Left 12/18/2014   Procedure: AXILLARY LYMPH NODE BIOPSY/;  Surgeon: JRobert Bellow MD;  Location: [facility];  Service: General;  Laterality: Left;   AXILLARY LYMPH NODE DISSECTION Left 12/18/2014   Procedure: AXILLARY LYMPH NODE DISSECTION;  Surgeon: JRobert Bellow MD;  Location: [facility];  Service: General;  Laterality: Left;  BREAST BIOPSY Left 05/2014   CORE BX OF LN, METASTATIC ADENOCARCINOMA   BREAST LUMPECTOMY Left 12/2014   CHEMO FIRST THEN SURGERY OF LN   BREAST SURGERY     lymph node removal   CATARACT EXTRACTION W/PHACO Left 11/26/2020   Procedure: CATARACT EXTRACTION PHACO AND INTRAOCULAR LENS PLACEMENT ([place]) LEFT;  Surgeon: Birder Robson, MD;  Location: [place];  Service: Ophthalmology;   Laterality: Left;  6.40 00:43.9   CATARACT EXTRACTION W/PHACO Right 12/10/2020   Procedure: CATARACT EXTRACTION PHACO AND INTRAOCULAR LENS PLACEMENT ([facility]) RIGHT;  Surgeon: Birder Robson, MD;  Location: [place];  Service: Ophthalmology;  Laterality: Right;  7.59 0:47.4   CHOLECYSTECTOMY N/A 04/19/2016   Procedure: LAPAROSCOPIC CHOLECYSTECTOMY WITH INTRAOPERATIVE CHOLANGIOGRAM;  Surgeon: Hubbard Robinson, MD;  Location: [facility];  Service: General;  Laterality: N/A;   COLON RESECTION Right 01/10/2019   Procedure: HAND ASSISTED LAPAROSCOPIC RIGHT COLON RESECTION;  Surgeon: Jules Husbands, MD;  Location: [facility];  Service: General;  Laterality: Right;   COLONOSCOPY WITH PROPOFOL N/A 12/30/2018   Procedure: COLONOSCOPY WITH PROPOFOL;  Surgeon: Lucilla Lame, MD;  Location: [place];  Service: Endoscopy;  Laterality: N/A;   COLONOSCOPY WITH PROPOFOL N/A 03/05/2020   Procedure: COLONOSCOPY WITH PROPOFOL;  Surgeon: Lucilla Lame, MD;  Location[facility] ENDOSCOPY;  Service: Endoscopy;  Laterality: N/A;   medial branch block  11/06/2015   lumbar facet Dr. Primus Bravo   POLYPECTOMY N/A 12/30/2018   Procedure: POLYPECTOMY;  Surgeon: Lucilla Lame, MD;  Location: [place];  Service: Endoscopy;  Laterality: N/A;  Clips placed at Hepatic Flexure Polyp (2) and Transverse Colon Polyp (4) removal sites   PORTACATH PLACEMENT Right 02/02/2019   Procedure: INSERTION PORT-A-CATH;  Surgeon: Jules Husbands, MD;  Location: [facility];  Service: General;  Laterality: Right;   RADIOFREQUENCY ABLATION     lumbar   SENTINEL NODE BIOPSY Left 12/18/2014   Procedure: SENTINEL NODE BIOPSY;  Surgeon: Robert Bellow, MD;  Location: [facility];  Service: General;  Laterality: Left;    FAMILY HISTORY Family History  Problem Relation Age of Onset   Breast cancer Sister 107   Colon cancer Sister    Colon cancer Mother        dx 58s   Hypertension Mother    Asthma Mother    Cancer Father        voice box  removed   Cancer Maternal Grandmother        unsure type   Colon cancer Cousin    Cancer Cousin        unsure type       ADVANCED DIRECTIVES:    HEALTH MAINTENANCE: Social History   Tobacco Use   Smoking status: Former    Packs/day: 0.50    Types: Cigarettes    Quit date: 07/18/2014    Years since quitting: 7.2   Smokeless tobacco: Never  Vaping Use   Vaping Use: Never used  Substance Use Topics   Alcohol use: No    Alcohol/week: 0.0 standard drinks   Drug use: No    No Known Allergies  Current Outpatient Medications  Medication Sig Dispense Refill   acetaminophen (TYLENOL) 500 MG tablet Take 500 mg by mouth every 6 (six) hours as needed for mild pain or moderate pain.      albuterol (VENTOLIN HFA) 108 (90 Base) MCG/ACT inhaler Inhale 2 puffs into the lungs every 6 (six) hours as needed for wheezing or shortness of breath. 6.7 g  3   amLODipine (NORVASC) 5 MG tablet Take 1 tablet (5 mg total) by mouth daily. 90 tablet 3   atorvastatin (LIPITOR) 80 MG tablet Take 1 tablet (80 mg total) by mouth daily. 90 tablet 3   baclofen (LIORESAL) 10 MG tablet Take 1 tablet (10 mg total) by mouth 2 (two) times daily. 30 each 0   cetirizine (ZYRTEC) 10 MG tablet Take 10 mg by mouth daily at 6 (six) AM.     cholecalciferol (VITAMIN D) 1000 units tablet Take 1,000 Units by mouth daily.     fluticasone (FLONASE) 50 MCG/ACT nasal spray Use 2 spray(s) in each nostril once daily 16 g 3   gabapentin (NEURONTIN) 300 MG capsule Take 300-600 mg by mouth 4 (four) times daily as needed (neuropathic pain).      HYDROcodone-acetaminophen (NORCO) 5-325 MG tablet Take 1 tablet by mouth every 4 (four) hours as needed for moderate pain. 12 tablet 0   letrozole (FEMARA) 2.5 MG tablet Take 2.5 mg by mouth daily.     NARCAN 4 MG/0.1ML LIQD nasal spray kit Place 1 spray into the nose.      ondansetron (ZOFRAN) 4 MG tablet Take 1 tablet (4 mg total) by mouth every 8 (eight) hours as needed for up to 20 doses  for refractory nausea / vomiting. 20 tablet 0   Oxycodone HCl 10 MG TABS LIMIT ONE HALF TO ONE TABLET BY MOUTH 3 TO 5 TIMES DAILY IF TOLERATED     PREVIDENT 5000 DRY MOUTH 1.1 % GEL dental gel Place onto teeth at bedtime.     promethazine (PHENERGAN) 25 MG tablet Take 1 tablet (25 mg total) by mouth every 8 (eight) hours as needed for nausea or vomiting. 20 tablet 0   pyridoxine (B-6) 100 MG tablet Take 100 mg by mouth daily.     sertraline (ZOLOFT) 100 MG tablet Take 2 tablets by mouth once daily 180 tablet 0   vitamin B-12 (CYANOCOBALAMIN) 500 MCG tablet Take 500 mcg by mouth daily.     No current facility-administered medications for this visit.   Facility-Administered Medications Ordered in Other Visits  Medication Dose Route Frequency Provider Last Rate Last Admin   heparin lock flush 100 unit/mL  500 Units Intravenous Once [name], MD       heparin lock flush 100 unit/mL  500 Units Intravenous Once [name], MD       sodium chloride flush (NS) 0.9 % injection 10 mL  10 mL Intravenous PRN [name], MD   10 mL at 02/27/19 0925    OBJECTIVE: Vitals:   10/07/21 1007  BP: 110/85  Pulse: 83  Resp: 16  Temp: (!) 96.3 F (35.7 C)  SpO2: 100%     Body mass index is 37.31 kg/m.    ECOG FS:1 - Symptomatic but completely ambulatory  General: Well-developed, well-nourished, no acute distress. Eyes: Pink conjunctiva, anicteric sclera. HEENT: Normocephalic, moist mucous membranes. Lungs: No audible wheezing or coughing. Heart: Regular rate and rhythm. Abdomen: Soft, nontender, no obvious distention. Musculoskeletal: No edema, cyanosis, or clubbing. Neuro: Alert, answering all questions appropriately. Cranial nerves grossly intact. Skin: No rashes or petechiae noted. Psych: Normal affect.  LAB RESULTS:  Lab Results  Component Value Date   NA 141 10/07/2021   K 3.3 (L) 10/07/2021   CL 108 10/07/2021   CO2 28 10/07/2021   GLUCOSE 116 (H)  10/07/2021   BUN 11 10/07/2021   CREATININE 1.09 (H) 10/07/2021  CALCIUM 8.9 10/07/2021   PROT 6.6 10/07/2021   ALBUMIN 3.4 (L) 10/07/2021   AST 24 10/07/2021   ALT 23 10/07/2021   ALKPHOS 66 10/07/2021   BILITOT 0.4 10/07/2021   GFRNONAA 54 (L) 10/07/2021   GFRAA 57 (L) 02/26/2020    Lab Results  Component Value Date   WBC 7.6 10/07/2021   NEUTROABS 5.2 10/07/2021   HGB 11.3 (L) 10/07/2021   HCT 32.4 (L) 10/07/2021   MCV 82.4 10/07/2021   PLT 246 10/07/2021     STUDIES: DG Chest 2 View  Result Date: 09/28/2021 CLINICAL DATA:  Nausea, vomiting, diarrhea EXAM: CHEST - 2 VIEW COMPARISON:  03/19/2021 FINDINGS: Patchy pulmonary infiltrate has developed within the lung bases bilaterally, new since prior CT examination of the abdomen pelvis of 09/23/2021, but similar to that noted on prior chest radiograph of 03/19/2021, likely infectious or inflammatory in nature. No pneumothorax or pleural effusion. Cardiac size is within normal limits. The thoracic aorta is tortuous and aneurysmal, but unchanged in contour since prior examination. Pulmonary vascularity is normal. No acute bone abnormality. IMPRESSION: Interval development of patchy bibasilar pulmonary infiltrate, likely infectious or inflammatory. Grossly stable tortuosity and aneurysm of the thoracic aorta Electronically Signed   By: Fidela Salisbury M.D.   On: 09/28/2021 20:21   CT ABDOMEN PELVIS W CONTRAST  Result Date: 09/28/2021 CLINICAL DATA:  Acute nonlocalized abdominal pain. Nausea, vomiting, diarrhea since this morning. EXAM: CT ABDOMEN AND PELVIS WITH CONTRAST TECHNIQUE: Multidetector CT imaging of the abdomen and pelvis was performed using the standard protocol following bolus administration of intravenous contrast. RADIATION DOSE REDUCTION: This exam was performed according to the departmental dose-optimization program which includes automated exposure control, adjustment of the mA and/or kV according to patient size and/or  use of iterative reconstruction technique. CONTRAST:  171m OMNIPAQUE IOHEXOL 300 MG/ML  SOLN COMPARISON:  CT 5 days ago 09/23/2021 FINDINGS: Lower chest: Patchy ground-glass and nodular opacities in the right lower and right middle lobe, new from recent CT. No pleural fluid. Breathing motion artifact partially obscures detailed assessment. Hepatobiliary: No focal liver abnormality is seen. Status post cholecystectomy. No biliary dilatation. Pancreas: No ductal dilatation or inflammation. No evidence of pancreatic mass. Spleen: Normal in size without focal abnormality. Adrenals/Urinary Tract: Normal adrenal glands. No hydronephrosis or perinephric edema. Homogeneous renal enhancement with symmetric excretion on delayed phase imaging. No visualized renal stone or focal renal lesion. Urinary bladder is physiologically distended without wall thickening. Stomach/Bowel: Small hiatal hernia. The distal esophagus is patulous. The stomach is nondistended. No small bowel obstruction or inflammation. Right hemicolectomy with surgical anastomosis in the mid transverse colon. No associated colonic wall thickening. There is fluid within the remainder of the colon. No pericolonic edema or associated wall thickening. Vascular/Lymphatic: Aortic atherosclerosis. Mild bi-iliac tortuosity. No aortic aneurysm. Patent portal and mesenteric veins. No abdominopelvic adenopathy. Reproductive: Status post hysterectomy. No adnexal masses. Other: No free air, ascites, or focal fluid collection. There is a tiny fat containing umbilical hernia. Musculoskeletal: Grade 1 anterolisthesis of L4 on L5 with associated facet hypertrophy and degenerative disc disease no focal bone lesion or acute osseous findings. IMPRESSION: 1. Fluid within the colon, suggestive of diarrheal illness. No bowel inflammation or obstruction. 2. Patchy ground-glass and nodular opacities in the right lower and right middle lobe, new from recent CT, likely infectious or  inflammatory bronchiolitis. 3. Small hiatal hernia. Aortic Atherosclerosis (ICD10-I70.0). Electronically Signed   By: MKeith RakeM.D.   On: 09/28/2021 18:35   CT CHEST  ABDOMEN PELVIS W CONTRAST  Result Date: 09/23/2021 CLINICAL DATA:  Colorectal cancer, surveillance. EXAM: CT CHEST, ABDOMEN, AND PELVIS WITH CONTRAST TECHNIQUE: Multidetector CT imaging of the chest, abdomen and pelvis was performed following the standard protocol during bolus administration of intravenous contrast. RADIATION DOSE REDUCTION: This exam was performed according to the departmental dose-optimization program which includes automated exposure control, adjustment of the mA and/or kV according to patient size and/or use of iterative reconstruction technique. CONTRAST:  132m OMNIPAQUE IOHEXOL 300 MG/ML  SOLN COMPARISON:  Multiple priors including most recent CT March 20, 2021 July 01, 2021. FINDINGS: CT CHEST FINDINGS Cardiovascular: Aortic atherosclerosis. No central embolus on this nondedicated study. Normal size heart. No significant pericardial effusion/thickening. Mediastinum/Nodes: No supraclavicular adenopathy. No discrete thyroid nodule. No pathologically enlarged mediastinal hilar or axillary lymph nodes. Patulous esophagus with retained versus refluxed contrast in the esophagus. Lungs/Pleura: No suspicious nodules or masses. Mild diffuse bronchial wall thickening. No pleural effusion. No pneumothorax. Musculoskeletal: Multilevel degenerative changes spine. No aggressive lytic or blastic lesion of bone. No suspicious chest wall mass. CT ABDOMEN PELVIS FINDINGS Hepatobiliary: No focal liver abnormality is seen. Status post cholecystectomy. No biliary dilatation. Pancreas: No pancreatic ductal dilatation or surrounding inflammatory changes. Spleen: Normal in size without focal abnormality. Adrenals/Urinary Tract: Adrenal glands are unremarkable. Kidneys are normal, without renal calculi, solid enhancing lesion, or  hydronephrosis. Bladder is unremarkable. Stomach/Bowel: Small hiatal hernia. Stomach is otherwise unremarkable for degree of distension. No pathologic dilation of small or large bowel. Similar changes of right hemicolectomy. Scattered colonic diverticulosis. Moderate volume of formed stool in the colon. Vascular/Lymphatic: Aortic atherosclerosis without aneurysmal dilation. No pathologically enlarged abdominal or pelvic lymph nodes. Reproductive: Status post hysterectomy. No adnexal masses. Other: No significant abdominopelvic free fluid. No discrete peritoneal or omental nodularity. Musculoskeletal: No suspicious bone lesions identified. Multilevel degenerative changes spine with degenerative grade 1 anterolisthesis of L4 on L5. IMPRESSION: 1. Stable examination without new or progressive findings to suggest local recurrence or metastatic disease within the chest, abdomen, or pelvis. 2. Small hiatal hernia with patulous esophagus and retained versus refluxed contrast in the esophagus, may reflect esophageal reflux. 3. Aortic Atherosclerosis (ICD10-I70.0). Electronically Signed   By: JDahlia BailiffM.D.   On: 09/23/2021 11:50     ASSESSMENT: Stage IIa ER+ left breast cancer with no breast lesion and axillary lymph node metastasis. (TxN1M0)  HER-2 negative.  Now with stage IIIa colon cancer.  PLAN:   1.  Stage IIIa colon cancer: Pathology report reviewed independently.  Previously, patient agreed to enroll in clinical trial.  Oxaliplatin was discontinued early secondary to worsening neuropathy.  Patient completed 12 cycles of chemotherapy on July 31, 2019 and then completed maintenance Tecentriq on January 29, 2020.  Restaging Sof-Kling scan from September 28, 2021 reviewed independently and reported as above with no obvious evidence of recurrent or progressive disease.  CEA continues to be within normal limits at 4.3.  Colonoscopy on March 05, 2020 which only revealed a 4 mm benign polyp.  Patient was supposed  to have colonoscopy in August 2022, but never scheduled.  She wishes to defer further until she is fully covered from her adenovirus infection.  Return to clinic in 6 months for laboratory work, repeat imaging, and further evaluation.    2. Stage IIa ER+ left breast cancer with no breast lesion and axillary lymph node metastasis: Despite no obvious breast lesion on mammogram or breast MRI, pathology and pattern of spread was consistent with primary breast cancer. CT and  bone scan at time of diagnosis revealed no other evidence of malignancy.  She only received 3 cycles of Adriamycin and Cytoxan. Taxol was discontinued early as well secondary to persistent peripheral neuropathy. Patient completed her chemotherapy on Nov 19, 2014.  She elected not to pursue axillary node dissection given the potential morbidity of this procedure. It was also elected not to pursue adjuvant XRT given there was no primary breast lesion.  Patient completed approximately 4 years of treatment with letrozole, but this was discontinued secondary to enrolling in clinical trial for her newly diagnosed colon cancer.  Her most recent mammogram on July 17, 2021 was reported as BI-RADS 1.  Repeat in January 2024.  3.  Bone health: Patient's most recent bone mineral density on February 03, 2018 reported T score of -0.7 which is unchanged from 3 years prior.  Continue monitoring and evaluation per primary care.  4.  Lynch syndrome: Genetic testing confirmed patient has Lynch syndrome.  Likely maternal patient reports her mother had colon cancer at a very young age.  She reports her sister and son both have tested positive.  She reports her other children are not interested in genetic testing.  Appreciate genetics input.  Continue yearly colonoscopies as above.  5.  Endometrial cancer: Patient has had a total hysterectomy. 6.  Back pain/sciatica: Patient does not complain of this today.  Continue monthly follow-up with pain clinic as scheduled.   Patient reports insurance will only allow 3 injections every 6 months.   7.  Peripheral neuropathy: Patient does not complain of this today.  Continue gabapentin 600 mg 4 times per day.  Continue follow-up with pain clinic as scheduled.  She declined referral to neuro oncology. 8.  Anemia: Mild, monitor.  Patient's hemoglobin is 11.3. 9.  Renal insufficiency: Mild. 10.  Hypokalemia: Likely residual from diarrhea secondary to adenovirus.  Monitor.   Patient expressed understanding and was in agreement with this plan. She also understands that She can call clinic at any time with any questions, concerns, or complaints.   Breast cancer metastasized to axillary lymph node   Staging form: Breast, AJCC 7th Edition     Clinical stage from 10/22/2014: Stage Unknown (TX, N1, M0) - Signed by [name], MD on 10/22/2014   [name], MD   10/08/2021 8:49 AM

## 2021-10-08 ENCOUNTER — Encounter: Payer: Self-pay | Admitting: Oncology

## 2021-10-08 LAB — CEA: CEA: 4.3 ng/mL (ref 0.0–4.7)

## 2021-10-09 ENCOUNTER — Ambulatory Visit (INDEPENDENT_AMBULATORY_CARE_PROVIDER_SITE_OTHER): Payer: Medicare (Managed Care)

## 2021-10-09 ENCOUNTER — Other Ambulatory Visit: Payer: Self-pay

## 2021-10-09 VITALS — BP 110/80 | HR 87 | Temp 96.2°F | Ht 63.0 in | Wt 206.9 lb

## 2021-10-09 DIAGNOSIS — Z0000 Encounter for general adult medical examination without abnormal findings: Secondary | ICD-10-CM | POA: Diagnosis not present

## 2021-10-09 DIAGNOSIS — C184 Malignant neoplasm of transverse colon: Secondary | ICD-10-CM

## 2021-10-09 NOTE — Patient Instructions (Signed)
Carrie Alvarez , Thank you for taking time to come for your Medicare Wellness Visit. I appreciate your ongoing commitment to your health goals. Please review the following plan we discussed and let me know if I can assist you in the future.   Screening recommendations/referrals: Colonoscopy: 03/05/20 Mammogram: 07/17/21 Bone Density: 02/03/18 Recommended yearly ophthalmology/optometry visit for glaucoma screening and checkup Recommended yearly dental visit for hygiene and checkup  Vaccinations: Influenza vaccine: n/c Pneumococcal vaccine: 09/28/17 Tdap vaccine: n/d Shingles vaccine: n/d   Covid-19:09/06/19, 09/27/19, 06/25/20, 11/21/20  Advanced directives: no  Conditions/risks identified: none  Next appointment: Follow up in one year for your annual wellness visit - 10/14/22 @ 3:10pm in person   Preventive Care 19 Years and Older, Female Preventive care refers to lifestyle choices and visits with your health care provider that can promote health and wellness. What does preventive care include? A yearly physical exam. This is also called an annual well check. Dental exams once or twice a year. Routine eye exams. Ask your health care provider how often you should have your eyes checked. Personal lifestyle choices, including: Daily care of your teeth and gums. Regular physical activity. Eating a healthy diet. Avoiding tobacco and drug use. Limiting alcohol use. Practicing safe sex. Taking low-dose aspirin every day. Taking vitamin and mineral supplements as recommended by your health care provider. What happens during an annual well check? The services and screenings done by your health care provider during your annual well check will depend on your age, overall health, lifestyle risk factors, and family history of disease. Counseling  Your health care provider may ask you questions about your: Alcohol use. Tobacco use. Drug use. Emotional well-being. Home and relationship  well-being. Sexual activity. Eating habits. History of falls. Memory and ability to understand (cognition). Work and work Statistician. Reproductive health. Screening  You may have the following tests or measurements: Height, weight, and BMI. Blood pressure. Lipid and cholesterol levels. These may be checked every 5 years, or more frequently if you are over 15 years old. Skin check. Lung cancer screening. You may have this screening every year starting at age 51 if you have a 30-pack-year history of smoking and currently smoke or have quit within the past 15 years. Fecal occult blood test (FOBT) of the stool. You may have this test every year starting at age 36. Flexible sigmoidoscopy or colonoscopy. You may have a sigmoidoscopy every 5 years or a colonoscopy every 10 years starting at age 91. Hepatitis C blood test. Hepatitis B blood test. Sexually transmitted disease (STD) testing. Diabetes screening. This is done by checking your blood sugar (glucose) after you have not eaten for a while (fasting). You may have this done every 1-3 years. Bone density scan. This is done to screen for osteoporosis. You may have this done starting at age 33. Mammogram. This may be done every 1-2 years. Talk to your health care provider about how often you should have regular mammograms. Talk with your health care provider about your test results, treatment options, and if necessary, the need for more tests. Vaccines  Your health care provider may recommend certain vaccines, such as: Influenza vaccine. This is recommended every year. Tetanus, diphtheria, and acellular pertussis (Tdap, Td) vaccine. You may need a Td booster every 10 years. Zoster vaccine. You may need this after age 78. Pneumococcal 13-valent conjugate (PCV13) vaccine. One dose is recommended after age 72. Pneumococcal polysaccharide (PPSV23) vaccine. One dose is recommended after age 55. Talk to your health  care provider about which  screenings and vaccines you need and how often you need them. This information is not intended to replace advice given to you by your health care provider. Make sure you discuss any questions you have with your health care provider. Document Released: 07/26/2015 Document Revised: 03/18/2016 Document Reviewed: 04/30/2015 Elsevier Interactive Patient Education  2017 [employer] Prevention in the Home Falls can cause injuries. They can happen to people of all ages. There are many things you can do to make your home safe and to help prevent falls. What can I do on the outside of my home? Regularly fix the edges of walkways and driveways and fix any cracks. Remove anything that might make you trip as you walk through a door, such as a raised step or threshold. Trim any bushes or trees on the path to your home. Use bright outdoor lighting. Clear any walking paths of anything that might make someone trip, such as rocks or tools. Regularly check to see if handrails are loose or broken. Make sure that both sides of any steps have handrails. Any raised decks and porches should have guardrails on the edges. Have any leaves, snow, or ice cleared regularly. Use sand or salt on walking paths during winter. Clean up any spills in your garage right away. This includes oil or grease spills. What can I do in the bathroom? Use night lights. Install grab bars by the toilet and in the tub and shower. Do not use towel bars as grab bars. Use non-skid mats or decals in the tub or shower. If you need to sit down in the shower, use a plastic, non-slip stool. Keep the floor dry. Clean up any water that spills on the floor as soon as it happens. Remove soap buildup in the tub or shower regularly. Attach bath mats securely with double-sided non-slip rug tape. Do not have throw rugs and other things on the floor that can make you trip. What can I do in the bedroom? Use night lights. Make sure that you have a  light by your bed that is easy to reach. Do not use any sheets or blankets that are too big for your bed. They should not hang down onto the floor. Have a firm chair that has side arms. You can use this for support while you get dressed. Do not have throw rugs and other things on the floor that can make you trip. What can I do in the kitchen? Clean up any spills right away. Avoid walking on wet floors. Keep items that you use a lot in easy-to-reach places. If you need to reach something above you, use a strong step stool that has a grab bar. Keep electrical cords out of the way. Do not use floor polish or wax that makes floors slippery. If you must use wax, use non-skid floor wax. Do not have throw rugs and other things on the floor that can make you trip. What can I do with my stairs? Do not leave any items on the stairs. Make sure that there are handrails on both sides of the stairs and use them. Fix handrails that are broken or loose. Make sure that handrails are as long as the stairways. Check any carpeting to make sure that it is firmly attached to the stairs. Fix any carpet that is loose or worn. Avoid having throw rugs at the top or bottom of the stairs. If you do have throw rugs, attach them to  the floor with carpet tape. Make sure that you have a light switch at the top of the stairs and the bottom of the stairs. If you do not have them, ask someone to add them for you. What else can I do to help prevent falls? Wear shoes that: Do not have high heels. Have rubber bottoms. Are comfortable and fit you well. Are closed at the toe. Do not wear sandals. If you use a stepladder: Make sure that it is fully opened. Do not climb a closed stepladder. Make sure that both sides of the stepladder are locked into place. Ask someone to hold it for you, if possible. Clearly mark and make sure that you can see: Any grab bars or handrails. First and last steps. Where the edge of each step  is. Use tools that help you move around (mobility aids) if they are needed. These include: Canes. Walkers. Scooters. Crutches. Turn on the lights when you go into a dark area. Replace any light bulbs as soon as they burn out. Set up your furniture so you have a clear path. Avoid moving your furniture around. If any of your floors are uneven, fix them. If there are any pets around you, be aware of where they are. Review your medicines with your doctor. Some medicines can make you feel dizzy. This can increase your chance of falling. Ask your doctor what other things that you can do to help prevent falls. This information is not intended to replace advice given to you by your health care provider. Make sure you discuss any questions you have with your health care provider. Document Released: 04/25/2009 Document Revised: 12/05/2015 Document Reviewed: 08/03/2014 Elsevier Interactive Patient Education  2017 [employer].

## 2021-10-09 NOTE — Progress Notes (Signed)
Subjective:   Carrie Alvarez is a 71 y.o. female who presents for Medicare Annual (Subsequent) preventive examination.  Review of Systems           Objective:    Today's Vitals   10/09/21 1412 10/09/21 1413  BP: 110/80   Pulse: 87   Temp: (!) 96.2 F (35.7 C)   TempSrc: Skin   SpO2: 100%   Weight: 206 lb 14.4 oz (93.8 kg)   Height: _0  (1.6 m)   PainSc:  0-No pain   Body mass index is 36.65 kg/m.     10/07/2021   10:10 AM 09/28/2021    4:20 PM 07/01/2021    6:44 PM 03/19/2021    4:42 PM 12/10/2020    9:29 AM 11/26/2020   10:57 AM 10/20/2020   12:09 PM  Advanced Directives  Does Patient Have a Medical Advance Directive? _1  No No  Would patient like information on creating a medical advance directive? No - Patient declined    Yes (ED - Information included in AVS) Yes (MAU/Ambulatory/Procedural Areas - Information given) No - Patient declined    Current Medications (verified) Outpatient Encounter Medications as of 10/09/2021  Medication Sig   acetaminophen (TYLENOL) 500 MG tablet Take 500 mg by mouth every 6 (six) hours as needed for mild pain or moderate pain.    albuterol (VENTOLIN HFA) 108 (90 Base) MCG/ACT inhaler Inhale 2 puffs into the lungs every 6 (six) hours as needed for wheezing or shortness of breath.   amLODipine (NORVASC) 5 MG tablet Take 1 tablet (5 mg total) by mouth daily.   atorvastatin (LIPITOR) 80 MG tablet Take 1 tablet (80 mg total) by mouth daily.   baclofen (LIORESAL) 10 MG tablet Take 1 tablet (10 mg total) by mouth 2 (two) times daily.   cetirizine (ZYRTEC) 10 MG tablet Take 10 mg by mouth daily at 6 (six) AM.   cholecalciferol (VITAMIN D) 1000 units tablet Take 1,000 Units by mouth daily.   fluticasone (FLONASE) 50 MCG/ACT nasal spray Use 2 spray(s) in each nostril once daily   gabapentin (NEURONTIN) 300 MG capsule Take 300-600 mg by mouth 4 (four) times daily as needed (neuropathic pain).    HYDROcodone-acetaminophen  (NORCO) 5-325 MG tablet Take 1 tablet by mouth every 4 (four) hours as needed for moderate pain.   letrozole (FEMARA) 2.5 MG tablet Take 2.5 mg by mouth daily.   NARCAN 4 MG/0.1ML LIQD nasal spray kit Place 1 spray into the nose.    ondansetron (ZOFRAN) 4 MG tablet Take 1 tablet (4 mg total) by mouth every 8 (eight) hours as needed for up to 20 doses for refractory nausea / vomiting.   Oxycodone HCl 10 MG TABS LIMIT ONE HALF TO ONE TABLET BY MOUTH 3 TO 5 TIMES DAILY IF TOLERATED   PREVIDENT 5000 DRY MOUTH 1.1 % GEL dental gel Place onto teeth at bedtime.   promethazine (PHENERGAN) 25 MG tablet Take 1 tablet (25 mg total) by mouth every 8 (eight) hours as needed for nausea or vomiting.   pyridoxine (B-6) 100 MG tablet Take 100 mg by mouth daily.   sertraline (ZOLOFT) 100 MG tablet Take 2 tablets by mouth once daily   vitamin B-12 (CYANOCOBALAMIN) 500 MCG tablet Take 500 mcg by mouth daily.   Facility-Administered Encounter Medications as of 10/09/2021  Medication   heparin lock flush 100 unit/mL   heparin lock flush 100 unit/mL   sodium chloride flush (NS) 0.9 %  injection 10 mL    Allergies (verified) Patient has no known allergies.   History: Past Medical History:  Diagnosis Date   Allergy    Anemia    Arthritis    Back pain    Breast cancer ([place]) 2015   LT LUMPECTOMY 12-2014 FOLLOWING CHEMO   Carpal tunnel syndrome    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   Cataract    Chronic pain    Colon cancer ([place]) 01/2019   Depression    Dyspnea    Family history of breast cancer    Family history of colon cancer    GERD (gastroesophageal reflux disease)    problem with reflux during chemo   History of methicillin resistant staphylococcus aureus (MRSA)    Hypercholesteremia    Hypertension    Lumbosacral pain    sees pain management in [place]   Obesity    Personal history of chemotherapy 2016   left breast ca   Personal history of chemotherapy 2020   colon ca   Pinched  nerve    left side, lumbar area   Uterine cancer ([place]) 1994   Past Surgical History:  Procedure Laterality Date   ABDOMINAL HYSTERECTOMY  1994   UTERINE CA   AXILLARY LYMPH NODE BIOPSY Left 12/18/2014   Procedure: AXILLARY LYMPH NODE BIOPSY/;  Surgeon: Robert Bellow, MD;  Location: [facility];  Service: General;  Laterality: Left;   AXILLARY LYMPH NODE DISSECTION Left 12/18/2014   Procedure: AXILLARY LYMPH NODE DISSECTION;  Surgeon: Robert Bellow, MD;  Location: [facility];  Service: General;  Laterality: Left;   BREAST BIOPSY Left 05/2014   CORE BX OF LN, METASTATIC ADENOCARCINOMA   BREAST LUMPECTOMY Left 12/2014   CHEMO FIRST THEN SURGERY OF LN   BREAST SURGERY     lymph node removal   CATARACT EXTRACTION W/PHACO Left 11/26/2020   Procedure: CATARACT EXTRACTION PHACO AND INTRAOCULAR LENS PLACEMENT ([place]) LEFT;  Surgeon: Birder Robson, MD;  Location: [place];  Service: Ophthalmology;  Laterality: Left;  6.40 00:43.9   CATARACT EXTRACTION W/PHACO Right 12/10/2020   Procedure: CATARACT EXTRACTION PHACO AND INTRAOCULAR LENS PLACEMENT ([facility]) RIGHT;  Surgeon: Birder Robson, MD;  Location: [place];  Service: Ophthalmology;  Laterality: Right;  7.59 0:47.4   CHOLECYSTECTOMY N/A 04/19/2016   Procedure: LAPAROSCOPIC CHOLECYSTECTOMY WITH INTRAOPERATIVE CHOLANGIOGRAM;  Surgeon: Hubbard Robinson, MD;  Location: [facility];  Service: General;  Laterality: N/A;   COLON RESECTION Right 01/10/2019   Procedure: HAND ASSISTED LAPAROSCOPIC RIGHT COLON RESECTION;  Surgeon: Jules Husbands, MD;  Location: [facility];  Service: General;  Laterality: Right;   COLONOSCOPY WITH PROPOFOL N/A 12/30/2018   Procedure: COLONOSCOPY WITH PROPOFOL;  Surgeon: Lucilla Lame, MD;  Location: [place];  Service: Endoscopy;  Laterality: N/A;   COLONOSCOPY WITH PROPOFOL N/A 03/05/2020   Procedure: COLONOSCOPY WITH PROPOFOL;  Surgeon: Lucilla Lame, MD;  Location: [facility];  Service:  Endoscopy;  Laterality: N/A;   medial branch block  11/06/2015   lumbar facet Dr. Primus Bravo   POLYPECTOMY N/A 12/30/2018   Procedure: POLYPECTOMY;  Surgeon: Lucilla Lame, MD;  Location: [place];  Service: Endoscopy;  Laterality: N/A;  Clips placed at Hepatic Flexure Polyp (2) and Transverse Colon Polyp (4) removal sites   PORTACATH PLACEMENT Right 02/02/2019   Procedure: INSERTION PORT-A-CATH;  Surgeon: Jules Husbands, MD;  Location: [facility];  Service: General;  Laterality: Right;   RADIOFREQUENCY ABLATION     lumbar  SENTINEL NODE BIOPSY Left 12/18/2014   Procedure: SENTINEL NODE BIOPSY;  Surgeon: Robert Bellow, MD;  Location: [facility];  Service: General;  Laterality: Left;   Family History  Problem Relation Age of Onset   Breast cancer Sister 53   Colon cancer Sister    Colon cancer Mother        dx 29s   Hypertension Mother    Asthma Mother    Cancer Father        voice box removed   Cancer Maternal Grandmother        unsure type   Colon cancer Cousin    Cancer Cousin        unsure type   Social History   Socioeconomic History   Marital status: Married    Spouse name: Vicente Serene   Number of children: 3   Years of education: Not on file   Highest education level: Some college, no degree  Occupational History   Occupation: retired    Comment: was on disability  Tobacco Use   Smoking status: Former    Packs/day: 0.50    Types: Cigarettes    Quit date: 07/18/2014    Years since quitting: 7.2   Smokeless tobacco: Never  Vaping Use   Vaping Use: Never used  Substance and Sexual Activity   Alcohol use: No    Alcohol/week: 0.0 standard drinks   Drug use: No   Sexual activity: Not on file  Other Topics Concern   Not on file  Social History Narrative   Husband and patient have not lived together for 10 years.  He is still helpful with patient.   Social Determinants of Health   Financial Resource Strain: Not on file  Food Insecurity: Not on file   Transportation Needs: Not on file  Physical Activity: Not on file  Stress: Not on file  Social Connections: Not on file    Tobacco Counseling Counseling given: Not Answered   Clinical Intake:  Pre-visit preparation completed: Yes  Pain : No/denies pain Pain Score: 0-No pain     Nutritional Risks: None Diabetes: No  How often do you need to have someone help you when you read instructions, pamphlets, or other written materials from your doctor or pharmacy?: 1 - Never  Diabetic?no  Interpreter Needed?: No  Information entered by :: Kirke Shaggy, LPN   Activities of Daily Living    09/29/2021    8:50 AM 12/10/2020    9:24 AM  In your present state of health, do you have any difficulty performing the following activities:  Hearing? 0 0  Vision? 0 0  Difficulty concentrating or making decisions? 0 0  Walking or climbing stairs? 0 0  Dressing or bathing? 0 0    Patient Care Team: Birdie Sons, MD as PCP - General (Family Medicine) [name], MD as Consulting Physician (Oncology) [name], MD as Attending Physician (Pain Medicine) [name]., MD as Consulting Physician (Ophthalmology) [name], RN as Oncology Nurse Navigator [name], [name], MD as Referring Physician (Anesthesiology)  Indicate any recent Medical Services you may have received from other than [facility] providers in the past year (date may be approximate).     Assessment:   This is a routine wellness examination for Dorlisa.  Hearing/Vision screen No results found.  Dietary issues and exercise activities discussed:     Goals Addressed   None    Depression Screen    07/23/2021   11:08 AM 11/21/2020  9:17 AM 10/30/2020    3:21 PM 10/03/2020    1:52 PM 10/02/2019    8:46 AM 09/30/2018   10:26 AM 09/30/2018   10:23 AM  PHQ 2/9 Scores  PHQ - 2 Score 1 1 0 0 0 0 0  PHQ- 9 Score _0 Fall Risk    11/21/2020    9:17 AM 10/30/2020    3:21  PM 10/03/2020    1:53 PM 03/20/2020   11:08 AM 10/02/2019    8:48 AM  Fall Risk   Falls in the past year? 0 0 0 0 0  Number falls in past yr: 0 0 0  0  Injury with Fall? 0 0 0  0  Risk for fall due to :  No Fall Risks     Follow up Falls evaluation completed Falls evaluation completed       FALL RISK PREVENTION PERTAINING TO THE HOME:  Any stairs in or around the home? No  If so, are there any without handrails? No  Home free of loose throw rugs in walkways, pet beds, electrical cords, etc? Yes  Adequate lighting in your home to reduce risk of falls? Yes   ASSISTIVE DEVICES UTILIZED TO PREVENT FALLS:  Life alert? No  Use of a cane, walker or w/c? Yes  Grab bars in the bathroom? No  Shower chair or bench in shower? Yes  Elevated toilet seat or a handicapped toilet? No   TIMED UP AND GO:  Was the test performed? Yes .  Length of time to ambulate 10 feet: 4 sec.   Gait steady and fast without use of assistive device  Cognitive Function:        10/02/2019    8:52 AM 09/22/2016    9:56 AM  6CIT Screen  What Year? 0 points 0 points  What month? 0 points 0 points  What time? 0 points 0 points  Count back from 20 0 points 0 points  Months in reverse 0 points 0 points  Repeat phrase 0 points 0 points  Total Score 0 points 0 points    Immunizations Immunization History  Administered Date(s) Administered   Fluad Quad(high Dose 65+) 03/23/2019, 06/25/2020   Influenza, High Dose Seasonal PF 06/07/2017, 06/07/2018   Influenza,inj,Quad PF,6+ Mos 04/20/2016   PFIZER Comirnaty(Gray Top)Covid-19 Tri-Sucrose Vaccine 11/21/2020   PFIZER(Purple Top)SARS-COV-2 Vaccination 09/06/2019, 09/27/2019, 06/25/2020   Pneumococcal Conjugate-13 09/22/2016   Pneumococcal Polysaccharide-23 09/28/2017    TDAP status: Due, Education has been provided regarding the importance of this vaccine. Advised may receive this vaccine at local pharmacy or Health Dept. Aware to provide a copy of the  vaccination record if obtained from local pharmacy or Health Dept. Verbalized acceptance and understanding.  Flu Vaccine status: Declined, Education has been provided regarding the importance of this vaccine but patient still declined. Advised may receive this vaccine at local pharmacy or Health Dept. Aware to provide a copy of the vaccination record if obtained from local pharmacy or Health Dept. Verbalized acceptance and understanding.  Pneumococcal vaccine status: Up to date  Covid-19 vaccine status: Completed vaccines  Qualifies for Shingles Vaccine? Yes   Zostavax completed Yes   Shingrix Completed?: No.    Education has been provided regarding the importance of this vaccine. Patient has been advised to call insurance company to determine out of pocket expense if they have not yet received this vaccine. Advised may also receive vaccine at local pharmacy or Health  Dept. Verbalized acceptance and understanding.  Screening Tests Health Maintenance  Topic Date Due   Zoster Vaccines- Shingrix (1 of 2) Never done   COVID-19 Vaccine (5 - Booster for Pfizer series) 01/16/2021   INFLUENZA VACCINE  10/10/2021 (Originally 02/10/2021)   TETANUS/TDAP  07/13/2026 (Originally 09/21/1969)   MAMMOGRAM  07/18/2023   COLONOSCOPY (Pts 45-75yr Insurance coverage will need to be confirmed)  03/05/2025   Pneumonia Vaccine 71 Years old  Completed   DEXA SCAN  Completed   Hepatitis C Screening  Completed   HPV VACCINES  Aged Out    Health Maintenance  Health Maintenance Due  Topic Date Due   Zoster Vaccines- Shingrix (1 of 2) Never done   COVID-19 Vaccine (5 - Booster for Pfizer series) 01/16/2021    Colorectal cancer screening: Type of screening: Colonoscopy. Completed 03/05/20. Repeat every 5 years  Mammogram status: Completed 07/17/21. Repeat every year  Bone Density status: Completed 02/03/18. Results reflect: Bone density results: NORMAL. Repeat every 5 years.  Lung Cancer Screening: (Low Dose  CT Chest recommended if Age 71-80years, 30 pack-year currently smoking OR have quit w/in 15years.) does not qualify.    Additional Screening:  Hepatitis C Screening: does qualify; Completed 08/17/11  Vision Screening: Recommended annual ophthalmology exams for early detection of glaucoma and other disorders of the eye. Is the patient up to date with their annual eye exam?  Yes  Who is the provider or what is the name of the [facility] which the patient [facility] is not established with a provider, would they like to be referred to a provider to establish care? No .   Dental Screening: Recommended annual dental exams for proper oral hygiene  Community Resource Referral / Chronic Care Management: CRR required this visit?  No   CCM required this visit?  No      Plan:     I have personally reviewed and noted the following in the patient's chart:   Medical and social history Use of alcohol, tobacco or illicit drugs  Current medications and supplements including opioid prescriptions.  Functional ability and status Nutritional status Physical activity Advanced directives List of other physicians Hospitalizations, surgeries, and ER visits in previous 12 months Vitals Screenings to include cognitive, depression, and falls Referrals and appointments  In addition, I have reviewed and discussed with patient certain preventive protocols, quality metrics, and best practice recommendations. A written personalized care plan for preventive services as well as general preventive health recommendations were provided to patient.     LDionisio David LPN   [id number]  Nurse Notes: none

## 2021-10-13 ENCOUNTER — Ambulatory Visit (INDEPENDENT_AMBULATORY_CARE_PROVIDER_SITE_OTHER): Payer: Medicare (Managed Care) | Admitting: Family Medicine

## 2021-10-13 ENCOUNTER — Encounter: Payer: Self-pay | Admitting: Family Medicine

## 2021-10-13 VITALS — BP 111/86 | HR 84 | Temp 98.9°F | Resp 14 | Wt 202.0 lb

## 2021-10-13 DIAGNOSIS — A0811 Acute gastroenteropathy due to Norwalk agent: Secondary | ICD-10-CM

## 2021-10-13 DIAGNOSIS — F32A Depression, unspecified: Secondary | ICD-10-CM

## 2021-10-13 MED ORDER — SERTRALINE HCL 100 MG PO TABS: 200.0000 mg | ORAL_TABLET | Freq: Every day | ORAL | 4 refills | Status: DC

## 2021-10-13 NOTE — Progress Notes (Signed)
I,Roshena L Chambers,acting as a scribe for Lelon Huh, MD.,have documented all relevant documentation on the behalf of Lelon Huh, MD,as directed by  Lelon Huh, MD while in the presence of Lelon Huh, MD.   Established patient visit   Patient: Carrie Alvarez   DOB: June 27, 1951   71 y.o. Female  MRN: [id number] Visit Date: 10/13/2021  Today's healthcare provider: Lelon Huh, MD   Chief Complaint  Patient presents with   Hospitalization Follow-up   Subjective    HPI  Follow up Hospitalization  Patient was admitted to [facility] on 09/28/2021 and discharged on 10/01/2021. She was treated for gastroenteritis due to norovirus and sepsis. Treatment for this included IV fluids and anti-emetics. Patient received refills on as needed Phenergan for nausea and vomiting and as needed Zofran for refractory nausea and vomiting. Patient will follow-up with her PCP in several weeks. Telephone follow up was done on 10/06/2021 She reports good compliance with treatment. She reports this condition is resolved.  She has send had routine follow up office visit and labs with oncologist for colon cancer.  Last metabolic panel Lab Results  Component Value Date   GLUCOSE 116 (H) 10/07/2021   NA 141 10/07/2021   K 3.3 (L) 10/07/2021   CL 108 10/07/2021   CO2 28 10/07/2021   BUN 11 10/07/2021   CREATININE 1.09 (H) 10/07/2021   GFRNONAA 54 (L) 10/07/2021   CALCIUM 8.9 10/07/2021   PHOS 2.1 (L) 01/11/2019   PROT 6.6 10/07/2021   ALBUMIN 3.4 (L) 10/07/2021   LABGLOB 2.8 11/21/2020   AGRATIO 1.5 11/21/2020   BILITOT 0.4 10/07/2021   ALKPHOS 66 10/07/2021   AST 24 10/07/2021   ALT 23 10/07/2021   ANIONGAP 5 10/07/2021   Last CBC Lab Results  Component Value Date   WBC 7.6 10/07/2021   HGB 11.3 (L) 10/07/2021   HCT 32.4 (L) 10/07/2021   MCV 82.4 10/07/2021   MCH 28.8 10/07/2021   RDW 14.0 10/07/2021   PLT 246 10/07/2021     ----------------------------------------------------------------------------------------- -   Medications: Outpatient Medications Prior to Visit  Medication Sig   acetaminophen (TYLENOL) 500 MG tablet Take 500 mg by mouth every 6 (six) hours as needed for mild pain or moderate pain.    albuterol (VENTOLIN HFA) 108 (90 Base) MCG/ACT inhaler Inhale 2 puffs into the lungs every 6 (six) hours as needed for wheezing or shortness of breath.   amLODipine (NORVASC) 5 MG tablet Take 1 tablet (5 mg total) by mouth daily.   atorvastatin (LIPITOR) 80 MG tablet Take 1 tablet (80 mg total) by mouth daily.   baclofen (LIORESAL) 10 MG tablet Take 1 tablet (10 mg total) by mouth 2 (two) times daily.   cetirizine (ZYRTEC) 10 MG tablet Take 10 mg by mouth daily at 6 (six) AM.   cholecalciferol (VITAMIN D) 1000 units tablet Take 1,000 Units by mouth daily.   fluticasone (FLONASE) 50 MCG/ACT nasal spray Use 2 spray(s) in each nostril once daily   gabapentin (NEURONTIN) 300 MG capsule Take 300-600 mg by mouth 4 (four) times daily as needed (neuropathic pain).    HYDROcodone-acetaminophen (NORCO) 5-325 MG tablet Take 1 tablet by mouth every 4 (four) hours as needed for moderate pain.   letrozole (FEMARA) 2.5 MG tablet Take 2.5 mg by mouth daily.   NARCAN 4 MG/0.1ML LIQD nasal spray kit Place 1 spray into the nose.    ondansetron (ZOFRAN) 4 MG tablet Take 1 tablet (4 mg total) by  mouth every 8 (eight) hours as needed for up to 20 doses for refractory nausea / vomiting.   Oxycodone HCl 10 MG TABS LIMIT ONE HALF TO ONE TABLET BY MOUTH 3 TO 5 TIMES DAILY IF TOLERATED   PREVIDENT 5000 DRY MOUTH 1.1 % GEL dental gel Place onto teeth at bedtime.   promethazine (PHENERGAN) 25 MG tablet Take 1 tablet (25 mg total) by mouth every 8 (eight) hours as needed for nausea or vomiting.   pyridoxine (B-6) 100 MG tablet Take 100 mg by mouth daily.   sertraline (ZOLOFT) 100 MG tablet Take 2 tablets by mouth once daily   vitamin  B-12 (CYANOCOBALAMIN) 500 MCG tablet Take 500 mcg by mouth daily.   Facility-Administered Medications Prior to Visit  Medication Dose Route Frequency Provider   heparin lock flush 100 unit/mL  500 Units Intravenous Once [name], MD   heparin lock flush 100 unit/mL  500 Units Intravenous Once [name], MD   sodium chloride flush (NS) 0.9 % injection 10 mL  10 mL Intravenous PRN [name], MD    Review of Systems  Constitutional:  Negative for appetite change, chills, fatigue and fever.  Respiratory:  Negative for chest tightness and shortness of breath.   Cardiovascular:  Negative for chest pain and palpitations.  Gastrointestinal:  Negative for abdominal pain, nausea and vomiting.  Neurological:  Negative for dizziness and weakness.      Objective    BP 111/86 (BP Location: Right Arm, Patient Position: Sitting, Cuff Size: Large)   Pulse 84   Temp 98.9 F (37.2 C) (Oral)   Resp 14   Wt 202 lb (91.6 kg)   SpO2 100%   BMI 35.78 kg/m    Physical Exam   General: Appearance:    Obese female in no acute distress  Eyes:    PERRL, conjunctiva/corneas clear, EOM's intact       Lungs:     Clear to auscultation bilaterally, respirations unlabored  Heart:    Normal heart rate. Normal rhythm. No murmurs, rubs, or gallops.    MS:   All extremities are intact.    Neurologic:   Awake, alert, oriented x 3. No apparent focal neurological defect.         Assessment & Plan     1. Acute gastroenteropathy due to Norovirus GI symptoms resolved. Still a bit fatigued, but otherwise recovered. Call if symptoms change or if not rapidly improving.     2. Depression, unspecified depression type She states she is doing well but needs refill sertraline (ZOLOFT) 100 MG tablet; Take 2 tablets (200 mg total) by mouth daily.  Dispense: 180 tablet; Refill: 4      The entirety of the information documented in the History of Present Illness, Review of Systems and Physical  Exam were personally obtained by me. Portions of this information were initially documented by the CMA and reviewed by me for thoroughness and accuracy.     Lelon Huh, MD  [facility] [phone] (phone) [phone] (fax)  [place]

## 2021-10-27 ENCOUNTER — Inpatient Hospital Stay: Payer: Medicare (Managed Care) | Admitting: Family Medicine

## 2022-01-29 DIAGNOSIS — M47816 Spondylosis without myelopathy or radiculopathy, lumbar region: Secondary | ICD-10-CM | POA: Diagnosis not present

## 2022-02-16 ENCOUNTER — Telehealth: Payer: Self-pay

## 2022-02-16 ENCOUNTER — Other Ambulatory Visit: Payer: Self-pay | Admitting: Family Medicine

## 2022-02-16 DIAGNOSIS — I10 Essential (primary) hypertension: Secondary | ICD-10-CM

## 2022-02-16 DIAGNOSIS — F32A Depression, unspecified: Secondary | ICD-10-CM

## 2022-02-16 MED ORDER — AMLODIPINE BESYLATE 5 MG PO TABS: 5.0000 mg | ORAL_TABLET | Freq: Every day | ORAL | 3 refills | Status: DC

## 2022-02-16 MED ORDER — SERTRALINE HCL 100 MG PO TABS: 200.0000 mg | ORAL_TABLET | Freq: Every day | ORAL | 4 refills | Status: DC

## 2022-02-16 MED ORDER — ATORVASTATIN CALCIUM 80 MG PO TABS: 80.0000 mg | ORAL_TABLET | Freq: Every day | ORAL | 3 refills | Status: DC

## 2022-02-16 NOTE — Telephone Encounter (Signed)
Called patient to confirm medication transfer to [facility], per fax from pharmacy.  She reports she will keep using [facility] on [address].

## 2022-02-18 DIAGNOSIS — M47816 Spondylosis without myelopathy or radiculopathy, lumbar region: Secondary | ICD-10-CM | POA: Diagnosis not present

## 2022-03-02 ENCOUNTER — Other Ambulatory Visit: Payer: Self-pay

## 2022-03-02 ENCOUNTER — Telehealth: Payer: Self-pay | Admitting: Family Medicine

## 2022-03-02 DIAGNOSIS — J309 Allergic rhinitis, unspecified: Secondary | ICD-10-CM

## 2022-03-02 DIAGNOSIS — J40 Bronchitis, not specified as acute or chronic: Secondary | ICD-10-CM

## 2022-03-02 DIAGNOSIS — F32A Depression, unspecified: Secondary | ICD-10-CM

## 2022-03-02 MED ORDER — ALBUTEROL SULFATE HFA 108 (90 BASE) MCG/ACT IN AERS: 2.0000 | INHALATION_SPRAY | Freq: Four times a day (QID) | RESPIRATORY_TRACT | 3 refills | Status: DC | PRN

## 2022-03-02 MED ORDER — FLUTICASONE PROPIONATE 50 MCG/ACT NA SUSP: NASAL | 3 refills | Status: DC

## 2022-03-02 NOTE — Telephone Encounter (Signed)
[facility] faxed refill request for the following medications:  fluticasone (FLONASE) 50 MCG/ACT nasal spray   albuterol (VENTOLIN HFA) 108 (90 Base) MCG/ACT inhaler   Please advise.

## 2022-03-02 NOTE — Telephone Encounter (Signed)
Rx's refilled. 

## 2022-03-03 MED ORDER — SERTRALINE HCL 100 MG PO TABS: 200.0000 mg | ORAL_TABLET | Freq: Every day | ORAL | 4 refills | Status: DC

## 2022-03-03 NOTE — Addendum Note (Signed)
Addended by: Birdie Sons on: 03/03/2022 12:37 PM   Modules accepted: Orders

## 2022-03-04 DIAGNOSIS — M47816 Spondylosis without myelopathy or radiculopathy, lumbar region: Secondary | ICD-10-CM | POA: Diagnosis not present

## 2022-03-11 ENCOUNTER — Other Ambulatory Visit: Payer: Self-pay

## 2022-03-11 ENCOUNTER — Ambulatory Visit: Payer: Self-pay | Admitting: *Deleted

## 2022-03-11 ENCOUNTER — Encounter: Payer: Self-pay | Admitting: Oncology

## 2022-03-11 ENCOUNTER — Emergency Department
Admission: EM | Admit: 2022-03-11 | Discharge: 2022-03-11 | Disposition: A | Discharge status: Home / Self Care | Payer: Medicare Other | Attending: Emergency Medicine | Admitting: Emergency Medicine

## 2022-03-11 ENCOUNTER — Ambulatory Visit: Payer: Self-pay

## 2022-03-11 ENCOUNTER — Emergency Department: Payer: Medicare Other

## 2022-03-11 DIAGNOSIS — I10 Essential (primary) hypertension: Secondary | ICD-10-CM | POA: Insufficient documentation

## 2022-03-11 DIAGNOSIS — F17200 Nicotine dependence, unspecified, uncomplicated: Secondary | ICD-10-CM | POA: Diagnosis not present

## 2022-03-11 DIAGNOSIS — Z853 Personal history of malignant neoplasm of breast: Secondary | ICD-10-CM | POA: Diagnosis not present

## 2022-03-11 DIAGNOSIS — R0602 Shortness of breath: Secondary | ICD-10-CM | POA: Diagnosis not present

## 2022-03-11 DIAGNOSIS — Z8541 Personal history of malignant neoplasm of cervix uteri: Secondary | ICD-10-CM | POA: Diagnosis not present

## 2022-03-11 DIAGNOSIS — J209 Acute bronchitis, unspecified: Secondary | ICD-10-CM | POA: Diagnosis not present

## 2022-03-11 DIAGNOSIS — Z20822 Contact with and (suspected) exposure to covid-19: Secondary | ICD-10-CM | POA: Diagnosis not present

## 2022-03-11 LAB — SARS CORONAVIRUS 2 BY RT PCR: SARS Coronavirus 2 by RT PCR: NEGATIVE

## 2022-03-11 MED ORDER — IPRATROPIUM-ALBUTEROL 0.5-2.5 (3) MG/3ML IN SOLN
3.0000 mL | Freq: Once | RESPIRATORY_TRACT | Status: AC
  Administered 2022-03-11: 3 mL via RESPIRATORY_TRACT
  Filled 2022-03-11: qty 3

## 2022-03-11 MED ORDER — PREDNISONE 50 MG PO TABS: ORAL_TABLET | ORAL | 0 refills | Status: DC

## 2022-03-11 MED ORDER — ALBUTEROL SULFATE HFA 108 (90 BASE) MCG/ACT IN AERS
2.0000 | INHALATION_SPRAY | RESPIRATORY_TRACT | Status: DC | PRN
  Filled 2022-03-11: qty 6.7

## 2022-03-11 MED ORDER — PREDNISONE 20 MG PO TABS
60.0000 mg | ORAL_TABLET | Freq: Once | ORAL | Status: AC
  Administered 2022-03-11: 60 mg via ORAL
  Filled 2022-03-11: qty 3

## 2022-03-11 MED ORDER — IPRATROPIUM-ALBUTEROL 0.5-2.5 (3) MG/3ML IN SOLN
3.0000 mL | Freq: Once | RESPIRATORY_TRACT | Status: AC
  Administered 2022-03-11: 3 mL via RESPIRATORY_TRACT

## 2022-03-11 NOTE — ED Provider Notes (Signed)
[facility] Provider Note    Event Date/Time   First MD Initiated Contact with Patient 03/11/22 1645     (approximate)   History   Shortness of Breath   HPI  Carrie Alvarez is a 71 y.o. female  with pmh of colon cancer, anemia, breast cancer, hypertension, remote history of tobacco use who presents with shortness of breath.  Patient notes symptoms been going on for the past 4 days.  She has had cough that is nonproductive and both exertional and shortness of breath at rest.  Has been using albuterol inhaler with some relief.  Symptoms got worse today so she presented to the ED.  She received DuoNeb in triage and now feels improved is no longer having shortness of breath.  Denies chest pain lower extremity edema.  Patient has remote history of breast and colon cancer but this is in remission. The patient denies hx of prior DVT/PE, unilateral leg pain/swelling, hormone use, recent surgery, active cancer, prolonged immobilization, or hemoptysis.       Past Medical History:  Diagnosis Date   Allergy    Anemia    Arthritis    Back pain    Breast cancer ([place]) 2015   LT LUMPECTOMY 12-2014 FOLLOWING CHEMO   Carpal tunnel syndrome    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   Cataract    Chronic pain    Colon cancer ([place]) 01/2019   Depression    Dyspnea    Family history of breast cancer    Family history of colon cancer    GERD (gastroesophageal reflux disease)    problem with reflux during chemo   History of methicillin resistant staphylococcus aureus (MRSA)    Hypercholesteremia    Hypertension    Lumbosacral pain    sees pain management in [place]   Obesity    Personal history of chemotherapy 2016   left breast ca   Personal history of chemotherapy 2020   colon ca   Pinched nerve    left side, lumbar area   Uterine cancer ([place]) 1994    Patient Active Problem List   Diagnosis Date Noted   Obesity (BMI 30-39.9) 10/01/2021   Anemia  09/28/2021   Neuropathy 08/12/2021   Aortic atherosclerosis ([place]) 08/12/2021   Lumbar facet arthropathy 06/20/2020   Anterolisthesis of lumbar spine 05/30/2020   Foraminal stenosis of lumbar region 05/30/2020   Spinal stenosis of lumbar region with neurogenic claudication 05/30/2020   Lumbar radiculopathy 04/22/2020   MLH1-related Lynch syndrome (HNPCC2) 06/01/2019   Genetic testing 06/01/2019   Family history of breast cancer    Family history of colon cancer    Goals of care, counseling/discussion 01/22/2019   Colon cancer ([place]) 01/10/2019   Personal history of colonic polyps    Polyp of transverse colon    Neoplasm of digestive system    Morbid obesity ([place]) 06/07/2018   Acute cholecystitis    Transaminitis    Cholangitis 04/18/2016   Allergic rhinitis 09/04/2015   AA (alopecia areata) 09/04/2015   Arthritis, degenerative 09/04/2015   Cancer of uterus ([place]) 09/04/2015   Avitaminosis D 09/04/2015   Benign positional vertigo 09/04/2015   Hypertension 03/15/2015   DDD (degenerative disc disease), lumbar 03/07/2015   Dizziness 12/25/2014   Lumbosacral facet joint syndrome 12/10/2014   Sacroiliac joint dysfunction 12/10/2014   DDD (degenerative disc disease), lumbosacral 11/13/2014   Depression 10/13/2014   Breast cancer metastasized to axillary lymph node ([place]) 06/21/2014  Hypercholesteremia 08/30/2009   Compulsive tobacco user syndrome 04/21/2008   Adaptation reaction 07/18/2007     Physical Exam  Triage Vital Signs: ED Triage Vitals [03/11/22 1443]  Enc Vitals Group     BP 102/86     Pulse Rate 83     Resp 17     Temp 98.7 F (37.1 C)     Temp Source Oral     SpO2 99 %     Weight 200 lb (90.7 kg)     Height 5' 3"  (1.6 m)     Head Circumference      Peak Flow      Pain Score 0     Pain Loc      Pain Edu?      Excl. in [place]?     Most recent vital signs: Vitals:   03/11/22 1443 03/11/22 1742  BP: 102/86 138/88  Pulse: 83 79  Resp: 17 17  Temp: 98.7 F  (37.1 C)   SpO2: 99% 96%     General: Awake, no distress.  CV:  Good peripheral perfusion. No edema or asymmetry  Resp:  Normal effort. Scattered wheeze and rhonchi, moving good air, no distress Abd:  No distention.  Neuro:             Awake, Alert, Oriented x 3  Other:     ED Results / Procedures / Treatments  Labs (all labs ordered are listed, but only abnormal results are displayed) Labs Reviewed  SARS CORONAVIRUS 2 BY RT PCR     EKG  EKG interpretation performed by myself: NSR, nml axis, nml intervals, no acute ischemic changes    RADIOLOGY I reviewed and interpreted the CXR which does not show any acute cardiopulmonary process    PROCEDURES:  Critical Care performed: No  Procedures    MEDICATIONS ORDERED IN ED: Medications  ipratropium-albuterol (DUONEB) 0.5-2.5 (3) MG/3ML nebulizer solution 3 mL (3 mLs Nebulization Given 03/11/22 1457)  ipratropium-albuterol (DUONEB) 0.5-2.5 (3) MG/3ML nebulizer solution 3 mL (3 mLs Nebulization Given 03/11/22 1744)  predniSONE (DELTASONE) tablet 60 mg (60 mg Oral Given 03/11/22 1740)     IMPRESSION / MDM / ASSESSMENT AND PLAN / ED COURSE  I reviewed the triage vital signs and the nursing notes.                              Patient's presentation is most consistent with acute presentation with potential threat to life or bodily function.  Differential diagnosis includes, but is not limited to, pneumonia, viral illness, COPD, bronchitis, pulmonary embolism, less likely acute coronary syndrome, CHF exacerbation  Patient is a 71 year old female who presents with shortness of breath x4 days associated cough.  She has no prior history diagnosis of COPD but does have albuterol inhaler and she can history.  Has had nonproductive cough no fevers chills no lower extremity edema.  She has history of breast and colon cancer but these are in remission and has no other PE risk factors.  She is satting 99% on room air rest of her vitals  are reassuring she looks quite well.  After receiving DuoNeb in triage she is essentially asymptomatic.  There is no asymmetry to suggest DVT or CHF.  She has some scattered wheeze with rhonchi.  Chest x-ray does not show focal infiltrate to suggest pneumonia.  EKG is nonischemic.  Presentation is a history and wheeze we will treat with a DuoNeb steroid.  Given patient not having chest pain and is otherwise well-appearing troponin or additional labs.  Patient to use albuterol inhaler 4 puffs every 4 hours X 5 days.  Return to ED for symptom worsening.       FINAL CLINICAL IMPRESSION(S) / ED DIAGNOSES   Final diagnoses:  Shortness of breath  Acute bronchitis, unspecified organism     Rx / DC Orders   ED Discharge Orders          Ordered    predniSONE (DELTASONE) 50 MG tablet        03/11/22 1804             Note:  This document was prepared using Dragon voice recognition software and may include unintentional dictation errors.   Rada Hay, MD 03/11/22 Bosie Helper

## 2022-03-11 NOTE — ED Triage Notes (Signed)
Pt has been short of breath for a few days. Pt symptoms worsened today and her doctor was unable to see her, so they sent her here.

## 2022-03-11 NOTE — Telephone Encounter (Signed)
  Chief Complaint: Difficulty Breathing - wheezing, dry cough Symptoms: ibid Frequency: 'Sunday getting worse Pertinent Negatives: Patient denies Chest pain Disposition: [x]ED /[]Urgent Care (no appt availability in office) / []Appointment(In office/virtual)/ [] [place] [facility]/ []Home Care/ []Refused Recommended Disposition /[][place] [facility]/ [] Follow-up with PCP Additional Notes: Pt states she has been having difficulty breathing since Sunday, and it is getting worse. Pt will find someone to take her to ED or she will call EMS. Reason for Disposition  [1] MODERATE difficulty breathing (e.g., speaks in phrases, SOB even at rest, pulse 100-120) AND [2] NEW-onset or WORSE than normal  Answer Assessment - Initial Assessment Questions 1. RESPIRATORY STATUS: "Describe your breathing?" (e.g., wheezing, shortness of breath, unable to speak, severe coughing)      Wheezing 2. ONSET: "When did this breathing problem begin?"      Sunday 3. PATTERN "Does the difficult breathing come and go, or has it been constant since it started?"      Getting worse 4. SEVERITY: "How bad is your breathing?" (e.g., mild, moderate, severe)    - MILD: No SOB at rest, mild SOB with walking, speaks normally in sentences, can lie down, no retractions, pulse < 100.    - MODERATE: SOB at rest, SOB with minimal exertion and prefers to sit, cannot lie down flat, speaks in phrases, mild retractions, audible wheezing, pulse 100-120.    - SEVERE: Very SOB at rest, speaks in single words, struggling to breathe, sitting hunched forward, retractions, pulse > 120      severe 5. RECURRENT SYMPTOM: "Have you had difficulty breathing before?" If Yes, ask: "When was the last time?" and "What happened that time?"      Not recently 6. CARDIAC HISTORY: "Do you have any history of heart disease?" (e.g., heart attack, angina, bypass surgery, angioplasty)      no 7. LUNG HISTORY: "Do you have any history of lung disease?"   (e.g., pulmonary embolus, asthma, emphysema)     Bronchitis 8. CAUSE: "What do you think is causing the breathing problem?"      Might be bronchitis 9. OTHER SYMPTOMS: "Do you have any other symptoms? (e.g., dizziness, runny nose, cough, chest pain, fever)     Cough,  10. O2 SATURATION MONITOR:  "Do you use an oxygen saturation monitor (pulse oximeter) at home?" If Yes, ask: "What is your reading (oxygen level) today?" "What is your usual oxygen saturation reading?" (e.g., 95%)        11'$ . PREGNANCY: "Is there any chance you are pregnant?" "When was your last menstrual period?"       no 12. TRAVEL: "Have you traveled out of the country in the last month?" (e.g., travel history, exposures)  Protocols used: Breathing Difficulty-A-AH

## 2022-03-11 NOTE — Telephone Encounter (Signed)
Chief Complaint: SOB Symptoms: SOB at rest, wheezing at times. Dry cough. Using inhalers more frequently. States "I have bronchitis, diagnosed long time ago." Frequency: 'Sunday Pertinent Negatives: Patient denies fever Disposition: []ED /[x]Urgent Care (no appt availability in office) / []Appointment(In office/virtual)/ [] [place] [facility]/ []Home Care/ []Refused Recommended Disposition /[][facility]/ [] Follow-up with PCP Additional Notes: Directed to UC, states will follow disposition. Reason for Disposition  [1] MODERATE difficulty breathing (e.g., speaks in phrases, SOB even at rest, pulse 100-120) AND [2] NEW-onset or WORSE than normal  Answer Assessment - Initial Assessment Questions 1. ONSET: "When did the cough begin?"      Sunday 2. SEVERITY: "How bad is the cough today?"      "Not a lot" 3. SPUTUM: "Describe the color of your sputum" (none, dry cough; clear, white, yellow, green)     Dry 4. HEMOPTYSIS: "Are you coughing up any blood?" If so ask: "How much?" (flecks, streaks, tablespoons, etc.)     No 5. DIFFICULTY BREATHING: "Are you having difficulty breathing?" If Yes, ask: "How bad is it?" (e.g., mild, moderate, severe)    - MILD: No SOB at rest, mild SOB with walking, speaks normally in sentences, can lie down, no retractions, pulse < 100.    - MODERATE: SOB at rest, SOB with minimal exertion and prefers to sit, cannot lie down flat, speaks in phrases, mild retractions, audible wheezing, pulse 100-120.    - SEVERE: Very SOB at rest, speaks in single words, struggling to breathe, sitting hunched forward, retractions, pulse > 120      At rest 6. FEVER: "Do you have a fever?" If Yes, ask: "What is your temperature, how was it measured, and when did it start?"     no 7. CARDIAC HISTORY: "Do you have any history of heart disease?" (e.g., heart attack, congestive heart failure)       8. LUNG HISTORY: "Do you have any history of lung disease?"  (e.g., pulmonary  embolus, asthma, emphysema)      9. PE RISK FACTORS: "Do you have a history of blood clots?" (or: recent major surgery, recent prolonged travel, bedridden)      10'$ . OTHER SYMPTOMS: "Do you have any other symptoms?" (e.g., runny nose, wheezing, chest pain)       Congested, wheezing at times  Answer Assessment - Initial Assessment Questions 1. RESPIRATORY STATUS: "Describe your breathing?" (e.g., wheezing, shortness of breath, unable to speak, severe coughing)      SOB at rest 2. ONSET: "When did this breathing problem begin?"      Sunday 3. PATTERN "Does the difficult breathing come and go, or has it been constant since it started?"      [place] 4. SEVERITY: "How bad is your breathing?" (e.g., mild, moderate, severe)    - MILD: No SOB at rest, mild SOB with walking, speaks normally in sentences, can lie down, no retractions, pulse < 100.    - MODERATE: SOB at rest, SOB with minimal exertion and prefers to sit, cannot lie down flat, speaks in phrases, mild retractions, audible wheezing, pulse 100-120.    - SEVERE: Very SOB at rest, speaks in single words, struggling to breathe, sitting hunched forward, retractions, pulse > 120      Moderate 5. RECURRENT SYMPTOM: "Have you had difficulty breathing before?" If Yes, ask: "When was the last time?" and "What happened that time?"      Yes "I have bronchitis, diagnosed  long time ago." 6. CARDIAC  HISTORY: "Do you have any history of heart disease?" (e.g., heart attack, angina, bypass surgery, angioplasty)       7. LUNG HISTORY: "Do you have any history of lung disease?"  (e.g., pulmonary embolus, asthma, emphysema)      8. CAUSE: "What do you think is causing the breathing problem?"       9. OTHER SYMPTOMS: "Do you have any other symptoms? (e.g., dizziness, runny nose, cough, chest pain, fever)     Dry cough, wheezing, using inhalers more frequently. 10. O2 SATURATION MONITOR:  "Do you use an oxygen saturation monitor (pulse oximeter) at home?"  If Yes, ask: "What is your reading (oxygen level) today?" "What is your usual oxygen saturation reading?" (e.g., 95%)  Protocols used: Cough - Acute Non-Productive-A-AH, Breathing Difficulty-A-AH

## 2022-03-11 NOTE — Discharge Instructions (Addendum)
Your chest x-ray did not show any pneumonia.  Please take the prednisone once daily for the next 5 days.  Please use your albuterol inhaler 2 to 4 puffs every 4 hours.  If your breathing is worsening despite this please return to the emergency department.

## 2022-03-18 ENCOUNTER — Encounter: Payer: Self-pay | Admitting: Family Medicine

## 2022-03-18 ENCOUNTER — Ambulatory Visit (INDEPENDENT_AMBULATORY_CARE_PROVIDER_SITE_OTHER): Payer: Medicare Other | Admitting: Family Medicine

## 2022-03-18 VITALS — BP 102/76 | HR 77 | Temp 98.6°F | Resp 20 | Wt 216.0 lb

## 2022-03-18 DIAGNOSIS — E559 Vitamin D deficiency, unspecified: Secondary | ICD-10-CM | POA: Diagnosis not present

## 2022-03-18 DIAGNOSIS — E7800 Pure hypercholesterolemia, unspecified: Secondary | ICD-10-CM

## 2022-03-18 DIAGNOSIS — R5383 Other fatigue: Secondary | ICD-10-CM

## 2022-03-18 DIAGNOSIS — I700 Atherosclerosis of aorta: Secondary | ICD-10-CM | POA: Diagnosis not present

## 2022-03-18 DIAGNOSIS — F5101 Primary insomnia: Secondary | ICD-10-CM | POA: Diagnosis not present

## 2022-03-18 DIAGNOSIS — I10 Essential (primary) hypertension: Secondary | ICD-10-CM | POA: Diagnosis not present

## 2022-03-18 MED ORDER — TRAZODONE HCL 50 MG PO TABS: 50.0000 mg | ORAL_TABLET | Freq: Every evening | ORAL | 4 refills | Status: DC | PRN

## 2022-03-18 NOTE — Progress Notes (Signed)
I,Roshena L Chambers,acting as a scribe for Lelon Huh, MD.,have documented all relevant documentation on the behalf of Lelon Huh, MD,as directed by  Lelon Huh, MD while in the presence of Lelon Huh, MD.    Established patient visit   Patient: Carrie Alvarez   DOB: [birth year]   71 y.o. Female  MRN: [id number] Visit Date: 03/18/2022  Today's healthcare provider: Lelon Huh, MD   Chief Complaint  Patient presents with   Hypertension   Depression   Cough   Subjective    HPI  Hypertension, follow-up  BP Readings from Last 3 Encounters:  03/18/22 102/76  03/11/22 138/88  10/13/21 111/86   Wt Readings from Last 3 Encounters:  03/18/22 216 lb (98 kg)  03/11/22 200 lb (90.7 kg)  10/13/21 202 lb (91.6 kg)     She was last seen for hypertension 7 months ago.  BP at that visit was 134/95. Management since that visit includes continuing same medication.  She reports good compliance with treatment. She is not having side effects.  She is following a Regular diet. She is not exercising. She does not smoke.  Use of agents associated with hypertension: none.   Outside blood pressures are not checked. Symptoms: No chest pain No chest pressure  No palpitations No syncope  No dyspnea No orthopnea  No paroxysmal nocturnal dyspnea No lower extremity edema   Pertinent labs Lab Results  Component Value Date   CHOL 127 08/13/2021   HDL 66 08/13/2021   LDLCALC 48 08/13/2021   TRIG 59 08/13/2021   CHOLHDL 1.9 08/13/2021   Lab Results  Component Value Date   NA 141 10/07/2021   K 3.3 (L) 10/07/2021   CREATININE 1.09 (H) 10/07/2021   GFRNONAA 54 (L) 10/07/2021   GLUCOSE 116 (H) 10/07/2021   TSH 0.890 09/28/2021     The ASCVD Risk score (Arnett DK, et al., 2019) failed to calculate for the following reasons:   The valid total cholesterol range is 130 to 320  mg/dL  ---------------------------------------------------------------------------------------------------   Depression, Follow-up  She  was last seen for this 5 months ago. Changes made at last visit include none; continue same medication.   She reports good compliance with treatment. She is not having side effects.   She reports good tolerance of treatment. Current symptoms include: depressed mood She feels she is Unchanged since last visit.     10/09/2021    2:17 PM 07/23/2021   11:08 AM 11/21/2020    9:17 AM  Depression screen PHQ 2/9  Decreased Interest 0 1 0  Down, Depressed, Hopeless 0 0 1  PHQ - 2 Score 0 1 1  Altered sleeping  1 0  Tired, decreased energy  2 2  Change in appetite  1 0  Feeling bad or failure about yourself   0 0  Trouble concentrating  0 0  Moving slowly or fidgety/restless  0 0  Suicidal thoughts  0 0  PHQ-9 Score  5 3  Difficult doing work/chores   Not difficult at all    -----------------------------------------------------------------------------------------  Follow up ER visit  Patient was seen in ER for shortness of breath on 03/11/2022. She was treated for acute bronchitis. Treatment for this included nebulizer treatment and prednisone 19m for 5 days.  She reports good compliance with treatment. She reports this condition is  slightly improved but still has cough and shortness of breath She did have chest xray showing no acute changes.   -----------------------------------------------------------------------------------------  Her main problem today is she feels tired all the time. States she does not sleep well due to problems racing through her mind all night. She falls asleep in the evenings but wakes after a couple of hours and tosses and turns. Does not feel rested in the morning, but does not nap.   Medications: Outpatient Medications Prior to Visit  Medication Sig   acetaminophen (TYLENOL) 500 MG tablet Take 500 mg by mouth  every 6 (six) hours as needed for mild pain or moderate pain.    albuterol (VENTOLIN HFA) 108 (90 Base) MCG/ACT inhaler Inhale 2 puffs into the lungs every 6 (six) hours as needed for wheezing or shortness of breath.   amLODipine (NORVASC) 5 MG tablet Take 1 tablet (5 mg total) by mouth daily.   atorvastatin (LIPITOR) 80 MG tablet Take 1 tablet (80 mg total) by mouth daily.   baclofen (LIORESAL) 10 MG tablet Take 1 tablet (10 mg total) by mouth 2 (two) times daily.   cetirizine (ZYRTEC) 10 MG tablet Take 10 mg by mouth daily at 6 (six) AM.   cholecalciferol (VITAMIN D) 1000 units tablet Take 1,000 Units by mouth daily.   fluticasone (FLONASE) 50 MCG/ACT nasal spray Use 2 spray(s) in each nostril once daily   gabapentin (NEURONTIN) 300 MG capsule Take 300-600 mg by mouth 4 (four) times daily as needed (neuropathic pain).    HYDROcodone-acetaminophen (NORCO) 5-325 MG tablet Take 1 tablet by mouth every 4 (four) hours as needed for moderate pain.   letrozole (FEMARA) 2.5 MG tablet Take 2.5 mg by mouth daily.   NARCAN 4 MG/0.1ML LIQD nasal spray kit Place 1 spray into the nose.    Oxycodone HCl 10 MG TABS LIMIT ONE HALF TO ONE TABLET BY MOUTH 3 TO 5 TIMES DAILY IF TOLERATED   PREVIDENT 5000 DRY MOUTH 1.1 % GEL dental gel Place onto teeth at bedtime.   pyridoxine (B-6) 100 MG tablet Take 100 mg by mouth daily.   sertraline (ZOLOFT) 100 MG tablet Take 2 tablets (200 mg total) by mouth daily.   vitamin B-12 (CYANOCOBALAMIN) 500 MCG tablet Take 500 mcg by mouth daily.   [DISCONTINUED] ondansetron (ZOFRAN) 4 MG tablet Take 1 tablet (4 mg total) by mouth every 8 (eight) hours as needed for up to 20 doses for refractory nausea / vomiting.   [DISCONTINUED] predniSONE (DELTASONE) 50 MG tablet Take one pill daily for the next 5 days (Patient not taking: Reported on 03/18/2022)   [DISCONTINUED] promethazine (PHENERGAN) 25 MG tablet Take 1 tablet (25 mg total) by mouth every 8 (eight) hours as needed for nausea or  vomiting. (Patient not taking: Reported on 03/18/2022)   Facility-Administered Medications Prior to Visit  Medication Dose Route Frequency Provider   heparin lock flush 100 unit/mL  500 Units Intravenous Once [name], MD   heparin lock flush 100 unit/mL  500 Units Intravenous Once [name], MD   sodium chloride flush (NS) 0.9 % injection 10 mL  10 mL Intravenous PRN [name], MD    Review of Systems  Constitutional:  Positive for fatigue. Negative for appetite change, chills and fever.  Respiratory:  Positive for cough, shortness of breath and wheezing. Negative for chest tightness.   Cardiovascular:  Negative for chest pain and palpitations.  Gastrointestinal:  Negative for abdominal pain, nausea and vomiting.  Neurological:  Positive for dizziness. Negative for weakness.       Objective    BP 102/76 (BP Location: Right  Arm, Patient Position: Sitting, Cuff Size: Large)   Pulse 77   Temp 98.6 F (37 C) (Oral)   Resp 20   Wt 216 lb (98 kg)   SpO2 99% Comment: room air  BMI 38.26 kg/m    Physical Exam    General: Appearance:    Obese female in no acute distress  Eyes:    PERRL, conjunctiva/corneas clear, EOM's intact       Lungs:     Clear to auscultation bilaterally, respirations unlabored  Heart:    Normal heart rate. Normal rhythm. No murmurs, rubs, or gallops.    MS:   All extremities are intact.    Neurologic:   Awake, alert, oriented x 3. No apparent focal neurological defect.         Assessment & Plan     1. Primary insomnia Try  traZODone (DESYREL) 50 MG tablet; Take 1-2 tablets (50-100 mg total) by mouth at bedtime as needed for sleep.  Dispense: 30 tablet; Refill: 4 Encourage regular exercise or physical   2. Other fatigue Likely secondary to poor sleep - CBC  3. Avitaminosis D Due to check  VITAMIN D 25 Hydroxy (Vit-D Deficiency, Fractures)  4. Primary hypertension Well controlled.  .ccm   5. Aortic atherosclerosis  ([place])  6. Hypercholesteremia She is tolerating atorvastatin well with no adverse effects.   - Comprehensive metabolic panel - Lipid panel   7. Bronchitis Doing much better, completed prednisone, is to call if symptoms return     The entirety of the information documented in the History of Present Illness, Review of Systems and Physical Exam were personally obtained by me. Portions of this information were initially documented by the CMA and reviewed by me for thoroughness and accuracy.     Lelon Huh, MD  [facility] [phone] (phone) [phone] (fax)  [place]

## 2022-03-19 LAB — COMPREHENSIVE METABOLIC PANEL
ALT: 15 IU/L (ref 0–32)
AST: 14 IU/L (ref 0–40)
Albumin/Globulin Ratio: 1.7 (ref 1.2–2.2)
Albumin: 4 g/dL (ref 3.8–4.8)
Alkaline Phosphatase: 91 IU/L (ref 44–121)
BUN/Creatinine Ratio: 12 (ref 12–28)
BUN: 14 mg/dL (ref 8–27)
Bilirubin Total: 0.8 mg/dL (ref 0.0–1.2)
CO2: 25 mmol/L (ref 20–29)
Calcium: 9.4 mg/dL (ref 8.7–10.3)
Chloride: 105 mmol/L (ref 96–106)
Creatinine, Ser: 1.17 mg/dL — ABNORMAL HIGH (ref 0.57–1.00)
Globulin, Total: 2.4 g/dL (ref 1.5–4.5)
Glucose: 75 mg/dL (ref 70–99)
Potassium: 4.2 mmol/L (ref 3.5–5.2)
Sodium: 143 mmol/L (ref 134–144)
Total Protein: 6.4 g/dL (ref 6.0–8.5)
eGFR: 50 mL/min/{1.73_m2} — ABNORMAL LOW (ref >59)

## 2022-03-19 LAB — CBC
Hematocrit: 32 % — ABNORMAL LOW (ref 34.0–46.6)
Hemoglobin: 11.3 g/dL (ref 11.1–15.9)
MCH: 28.4 pg (ref 26.6–33.0)
MCHC: 35.3 g/dL (ref 31.5–35.7)
MCV: 80 fL (ref 79–97)
Platelets: 228 10*3/uL (ref 150–450)
RBC: 3.98 x10E6/uL (ref 3.77–5.28)
RDW: 13.4 % (ref 11.7–15.4)
WBC: 10.3 10*3/uL (ref 3.4–10.8)

## 2022-03-19 LAB — LIPID PANEL
Chol/HDL Ratio: 1.6 ratio (ref 0.0–4.4)
Cholesterol, Total: 130 mg/dL (ref 100–199)
HDL: 82 mg/dL (ref >39)
LDL Chol Calc (NIH): 37 mg/dL (ref 0–99)
Triglycerides: 44 mg/dL (ref 0–149)
VLDL Cholesterol Cal: 11 mg/dL (ref 5–40)

## 2022-03-19 LAB — VITAMIN D 25 HYDROXY (VIT D DEFICIENCY, FRACTURES): Vit D, 25-Hydroxy: 31.6 ng/mL (ref 30.0–100.0)

## 2022-03-24 ENCOUNTER — Ambulatory Visit: Payer: Self-pay | Admitting: *Deleted

## 2022-03-24 ENCOUNTER — Encounter: Payer: Self-pay | Admitting: Family Medicine

## 2022-03-24 ENCOUNTER — Ambulatory Visit (INDEPENDENT_AMBULATORY_CARE_PROVIDER_SITE_OTHER): Payer: Medicare Other | Admitting: Family Medicine

## 2022-03-24 VITALS — BP 125/76 | HR 68 | Resp 16 | Ht 63.0 in | Wt 225.0 lb

## 2022-03-24 DIAGNOSIS — Z23 Encounter for immunization: Secondary | ICD-10-CM | POA: Diagnosis not present

## 2022-03-24 DIAGNOSIS — M79604 Pain in right leg: Secondary | ICD-10-CM

## 2022-03-24 DIAGNOSIS — R609 Edema, unspecified: Secondary | ICD-10-CM | POA: Diagnosis not present

## 2022-03-24 DIAGNOSIS — R0609 Other forms of dyspnea: Secondary | ICD-10-CM | POA: Diagnosis not present

## 2022-03-24 DIAGNOSIS — M79605 Pain in left leg: Secondary | ICD-10-CM | POA: Diagnosis not present

## 2022-03-24 MED ORDER — FUROSEMIDE 20 MG PO TABS: 20.0000 mg | ORAL_TABLET | Freq: Every day | ORAL | 0 refills | Status: DC | PRN

## 2022-03-24 NOTE — Progress Notes (Signed)
I,Tiffany J Bragg,acting as a scribe for Lelon Huh, MD.,have documented all relevant documentation on the behalf of Lelon Huh, MD,as directed by  Lelon Huh, MD while in the presence of Lelon Huh, MD.   Established patient visit   Patient: Carrie Alvarez   DOB: [birth year]   71 y.o. Female  MRN: [id number] Visit Date: 03/24/2022  Today's healthcare provider: Lelon Huh, MD   Chief Complaint  Patient presents with   Leg Swelling   Subjective    HPI HPI   Patient complains of bilateral leg and ankle swelling starting this morning.  Last edited by Smitty Knudsen, CMA on 03/24/2022  2:54 PM.    Felt completely normal yesterday. No recent injuries. Only new medication is trazodone which she started a week ago. Is hurting in both feel, calves and knees. Has some chronic shortness of breath but has not gotten any worse. No fevers, chills, sweats, chest pains or palpitations.   Medications: Outpatient Medications Prior to Visit  Medication Sig   acetaminophen (TYLENOL) 500 MG tablet Take 500 mg by mouth every 6 (six) hours as needed for mild pain or moderate pain.    albuterol (VENTOLIN HFA) 108 (90 Base) MCG/ACT inhaler Inhale 2 puffs into the lungs every 6 (six) hours as needed for wheezing or shortness of breath.   amLODipine (NORVASC) 5 MG tablet Take 1 tablet (5 mg total) by mouth daily.   atorvastatin (LIPITOR) 80 MG tablet Take 1 tablet (80 mg total) by mouth daily.   baclofen (LIORESAL) 10 MG tablet Take 1 tablet (10 mg total) by mouth 2 (two) times daily.   cetirizine (ZYRTEC) 10 MG tablet Take 10 mg by mouth daily at 6 (six) AM.   cholecalciferol (VITAMIN D) 1000 units tablet Take 1,000 Units by mouth daily.   fluticasone (FLONASE) 50 MCG/ACT nasal spray Use 2 spray(s) in each nostril once daily   gabapentin (NEURONTIN) 300 MG capsule Take 300-600 mg by mouth 4 (four) times daily as needed (neuropathic pain).    HYDROcodone-acetaminophen (NORCO)  5-325 MG tablet Take 1 tablet by mouth every 4 (four) hours as needed for moderate pain.   letrozole (FEMARA) 2.5 MG tablet Take 2.5 mg by mouth daily.   NARCAN 4 MG/0.1ML LIQD nasal spray kit Place 1 spray into the nose.    Oxycodone HCl 10 MG TABS LIMIT ONE HALF TO ONE TABLET BY MOUTH 3 TO 5 TIMES DAILY IF TOLERATED   PREVIDENT 5000 DRY MOUTH 1.1 % GEL dental gel Place onto teeth at bedtime.   pyridoxine (B-6) 100 MG tablet Take 100 mg by mouth daily.   sertraline (ZOLOFT) 100 MG tablet Take 2 tablets (200 mg total) by mouth daily.   traZODone (DESYREL) 50 MG tablet Take 1-2 tablets (50-100 mg total) by mouth at bedtime as needed for sleep.   vitamin B-12 (CYANOCOBALAMIN) 500 MCG tablet Take 500 mcg by mouth daily.   Facility-Administered Medications Prior to Visit  Medication Dose Route Frequency Provider   heparin lock flush 100 unit/mL  500 Units Intravenous Once [name], MD   heparin lock flush 100 unit/mL  500 Units Intravenous Once [name], MD   sodium chloride flush (NS) 0.9 % injection 10 mL  10 mL Intravenous PRN [name], MD    Review of Systems     Objective    BP 125/76 (BP Location: Right Arm, Patient Position: Sitting, Cuff Size: Large)   Pulse 68   Resp  16   Ht _0  (1.6 m)   Wt 225 lb (102.1 kg)   SpO2 100%   BMI 39.86 kg/m    Physical Exam    General: Appearance:    Obese female in no acute distress  Eyes:    PERRL, conjunctiva/corneas clear, EOM's intact       Lungs:     Clear to auscultation bilaterally, respirations unlabored  Heart:    Normal heart rate. Normal rhythm. No murmurs, rubs, or gallops.    MS:   All extremities are intact.  2+ bilateral pitting edema of feet and lower legs. Diffusely tender in front and back or legs. Negative Homan's sign. No erythema.   Neurologic:   Awake, alert, oriented x 3. No apparent focal neurological defect.         Assessment & Plan     1. Need for influenza vaccination  -  Flu Vaccine QUAD High Dose(Fluad)  2. Pain in both lower extremities  3. Edema, unspecified type Sudden onset overnight, no specific trigger.   - D-Dimer, Quantitative - B Nat Peptide - TSH - Renal function panel  - furosemide (LASIX) 20 MG tablet; Take 1 tablet (20 mg total) by mouth daily as needed for up to 10 days for fluid.  Dispense: 10 tablet; Refill: 0      The entirety of the information documented in the History of Present Illness, Review of Systems and Physical Exam were personally obtained by me. Portions of this information were initially documented by the CMA and reviewed by me for thoroughness and accuracy.     Lelon Huh, MD  [facility] [phone] (phone) [phone] (fax)  [place]

## 2022-03-24 NOTE — Telephone Encounter (Signed)
Summary: swelling in both legs/pain   Pt stated both or her legs are swollen and she has some pain. Pt stated this started this morning from 1-10 pain ts about a 6.    Pt seeking clinical advice.        Chief Complaint: bilateral leg swelling  Symptoms: pain and swelling bilateral feet and legs to knees. Denies pitting edema.  Frequency: today  Pertinent Negatives: Patient denies chest  pain no difficulty breathing no fever Disposition: '[]'$ ED /'[]'$ Urgent Care (no appt availability in office) / '[x]'$ Appointment(In office/virtual)/ '[]'$  [facility]/ '[]'$ Home Care/ '[]'$ Refused Recommended Disposition /'[]'$ [facility]/ '[]'$  Follow-up with PCP Additional Notes:   Na        Reason for Disposition  [1] MODERATE leg swelling (e.g., swelling extends up to knees) AND [2] new-onset or worsening  Answer Assessment - Initial Assessment Questions 1. ONSET: "When did the swelling start?" (e.g., minutes, hours, days)     Noted this am  2. LOCATION: "What part of the leg is swollen?"  "Are both legs swollen or just one leg?"     Bilateral feet and legs 3. SEVERITY: "How bad is the swelling?" (e.g., localized; mild, moderate, severe)   - Localized: Small area of swelling localized to one leg.   - MILD pedal edema: Swelling limited to foot and ankle, pitting edema < 1/4 inch (6 mm) deep, rest and elevation eliminate most or all swelling.   - MODERATE edema: Swelling of lower leg to knee, pitting edema > 1/4 inch (6 mm) deep, rest and elevation only partially reduce swelling.   - SEVERE edema: Swelling extends above knee, facial or hand swelling present.      Swelling up to knees denies pitting edema 4. REDNESS: "Does the swelling look red or infected?"     no 5. PAIN: "Is the swelling painful to touch?" If Yes, ask: "How painful is it?"   (Scale 1-10; mild, moderate or severe)     6  6. FEVER: "Do you have a fever?" If Yes, ask: "What is it, how was it measured, and when did it start?"       no 7. CAUSE: "What do you think is causing the leg swelling?"     Not sure  8. MEDICAL HISTORY: "Do you have a history of blood clots (e.g., DVT), cancer, heart failure, kidney disease, or liver failure?"     See hx 9. RECURRENT SYMPTOM: "Have you had leg swelling before?" If Yes, ask: "When was the last time?" "What happened that time?"     na 10. OTHER SYMPTOMS: "Do you have any other symptoms?" (e.g., chest pain, difficulty breathing)       no 11. PREGNANCY: "Is there any chance you are pregnant?" "When was your last menstrual period?"       na  Protocols used: Leg Swelling and Edema-A-AH

## 2022-03-25 LAB — RENAL FUNCTION PANEL
Albumin: 4.3 g/dL (ref 3.8–4.8)
BUN/Creatinine Ratio: 13 (ref 12–28)
BUN: 14 mg/dL (ref 8–27)
CO2: 26 mmol/L (ref 20–29)
Calcium: 9.5 mg/dL (ref 8.7–10.3)
Chloride: 104 mmol/L (ref 96–106)
Creatinine, Ser: 1.1 mg/dL — ABNORMAL HIGH (ref 0.57–1.00)
Glucose: 90 mg/dL (ref 70–99)
Phosphorus: 2.7 mg/dL — ABNORMAL LOW (ref 3.0–4.3)
Potassium: 4.4 mmol/L (ref 3.5–5.2)
Sodium: 142 mmol/L (ref 134–144)
eGFR: 54 mL/min/1.73 — ABNORMAL LOW

## 2022-03-25 LAB — BRAIN NATRIURETIC PEPTIDE: BNP: 95.6 pg/mL (ref 0.0–100.0)

## 2022-03-25 LAB — D-DIMER, QUANTITATIVE: D-DIMER: 0.38 mg/L FEU (ref 0.00–0.49)

## 2022-03-25 LAB — TSH: TSH: 1.26 u[IU]/mL (ref 0.450–4.500)

## 2022-04-03 NOTE — Progress Notes (Deleted)
[place]  [phone] [phone]  ID: Carrie Alvarez OB: 1951-05-26  MR#: [id number]  ZJI#:[id number]  Patient Care Team: Birdie Sons, MD as PCP - General (Family Medicine) [name], MD as Consulting Physician (Oncology) [name], MD as Attending Physician (Pain Medicine) [name]., MD as Consulting Physician (Ophthalmology) [name], RN as Oncology Nurse Navigator [name], [name], MD as Referring Physician (Anesthesiology)  CHIEF COMPLAINT: Stage IIa ER+ left breast cancer with no breast lesion and axillary lymph node metastasis.  Now with stage IIIa colon cancer.  INTERVAL HISTORY: Patient returns to clinic today for routine 46-monthevaluation and discussion of her imaging and laboratory results.  She was recently in the hospital with adenovirus and has persistent weakness and fatigue from that admission.  Her GI symptoms have resolved.  She does not complain of back pain today.  She has no neurologic complaints.  She denies any fevers.  She has no chest pain, shortness of breath, cough, or hemoptysis.  She denies any current nausea, vomiting, constipation, or diarrhea.  She has no urinary complaints.  Patient offers no further specific complaints today.    REVIEW OF SYSTEMS:   Review of Systems  Constitutional:  Positive for malaise/fatigue. Negative for fever and weight loss.  Respiratory: Negative.  Negative for cough and shortness of breath.   Cardiovascular: Negative.  Negative for chest pain and leg swelling.  Gastrointestinal: Negative.  Negative for abdominal pain, blood in stool, constipation, diarrhea, melena, nausea and vomiting.  Genitourinary: Negative.  Negative for dysuria.  Musculoskeletal: Negative.  Negative for back pain, falls and neck pain.  Skin: Negative.  Negative for rash.  Neurological:  Positive for tingling, sensory change and weakness. Negative for dizziness, focal  weakness and headaches.  Psychiatric/Behavioral: Negative.  Negative for depression. The patient is not nervous/anxious.     As per HPI. Otherwise, a complete review of systems is negative.  PAST MEDICAL HISTORY: Past Medical History:  Diagnosis Date   Allergy    Anemia    Arthritis    Back pain    [place]   LT LUMPECTOMY 12-2014 FOLLOWING CHEMO   Carpal tunnel syndrome    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   Cataract    Chronic pain    [place]   Depression    Dyspnea    Family history of breast cancer    Family history of colon cancer    GERD (gastroesophageal reflux disease)    problem with reflux during chemo   History of methicillin resistant staphylococcus aureus (MRSA)    Hypercholesteremia    Hypertension    Lumbosacral pain    sees pain management in [place]   Obesity    Personal history of chemotherapy 2016   left breast ca   Personal history of chemotherapy 2020   colon ca   Pinched nerve    left side, lumbar area   [place]    PAST SURGICAL HISTORY: Past Surgical History:  Procedure Laterality Date   ABDOMINAL HYSTERECTOMY  1994   UTERINE CA   AXILLARY LYMPH NODE BIOPSY Left 12/18/2014   Procedure: AXILLARY LYMPH NODE BIOPSY/;  Surgeon: JRobert Bellow MD;  Location: [facility];  Service: General;  Laterality: Left;   AXILLARY LYMPH NODE DISSECTION Left 12/18/2014   Procedure: AXILLARY LYMPH NODE DISSECTION;  Surgeon: JRobert Bellow MD;  Location: [facility];  Service: General;  Laterality:  Left;   BREAST BIOPSY Left 05/2014   CORE BX OF LN, METASTATIC ADENOCARCINOMA   BREAST LUMPECTOMY Left 12/2014   CHEMO FIRST THEN SURGERY OF LN   BREAST SURGERY     lymph node removal   CATARACT EXTRACTION W/PHACO Left 11/26/2020   Procedure: CATARACT EXTRACTION PHACO AND INTRAOCULAR LENS PLACEMENT ([place]) LEFT;  Surgeon: Birder Robson, MD;  Location: [place];  Service: Ophthalmology;   Laterality: Left;  6.40 00:43.9   CATARACT EXTRACTION W/PHACO Right 12/10/2020   Procedure: CATARACT EXTRACTION PHACO AND INTRAOCULAR LENS PLACEMENT ([facility]) RIGHT;  Surgeon: Birder Robson, MD;  Location: [place];  Service: Ophthalmology;  Laterality: Right;  7.59 0:47.4   CHOLECYSTECTOMY N/A 04/19/2016   Procedure: LAPAROSCOPIC CHOLECYSTECTOMY WITH INTRAOPERATIVE CHOLANGIOGRAM;  Surgeon: Hubbard Robinson, MD;  Location: [facility];  Service: General;  Laterality: N/A;   COLON RESECTION Right 01/10/2019   Procedure: HAND ASSISTED LAPAROSCOPIC RIGHT COLON RESECTION;  Surgeon: Jules Husbands, MD;  Location: [facility];  Service: General;  Laterality: Right;   COLONOSCOPY WITH PROPOFOL N/A 12/30/2018   Procedure: COLONOSCOPY WITH PROPOFOL;  Surgeon: Lucilla Lame, MD;  Location: [place];  Service: Endoscopy;  Laterality: N/A;   COLONOSCOPY WITH PROPOFOL N/A 03/05/2020   Procedure: COLONOSCOPY WITH PROPOFOL;  Surgeon: Lucilla Lame, MD;  Location: [facility];  Service: Endoscopy;  Laterality: N/A;   medial branch block  11/06/2015   lumbar facet Dr. Primus Bravo   POLYPECTOMY N/A 12/30/2018   Procedure: POLYPECTOMY;  Surgeon: Lucilla Lame, MD;  Location: [place];  Service: Endoscopy;  Laterality: N/A;  Clips placed at Hepatic Flexure Polyp (2) and Transverse Colon Polyp (4) removal sites   PORTACATH PLACEMENT Right 02/02/2019   Procedure: INSERTION PORT-A-CATH;  Surgeon: Jules Husbands, MD;  Location: [facility];  Service: General;  Laterality: Right;   RADIOFREQUENCY ABLATION     lumbar   SENTINEL NODE BIOPSY Left 12/18/2014   Procedure: SENTINEL NODE BIOPSY;  Surgeon: Robert Bellow, MD;  Location: [facility];  Service: General;  Laterality: Left;    FAMILY HISTORY Family History  Problem Relation Age of Onset   Breast cancer Sister 44   Colon cancer Sister    Colon cancer Mother        dx 88s   Hypertension Mother    Asthma Mother    Cancer Father        voice box  removed   Cancer Maternal Grandmother        unsure type   Colon cancer Cousin    Cancer Cousin        unsure type       ADVANCED DIRECTIVES:    HEALTH MAINTENANCE: Social History   Tobacco Use   Smoking status: Former    Packs/day: 0.50    Types: Cigarettes    Quit date: 07/18/2014    Years since quitting: 7.7   Smokeless tobacco: Never  Vaping Use   Vaping Use: Never used  Substance Use Topics   Alcohol use: No    Alcohol/week: 0.0 standard drinks of alcohol   Drug use: No    No Known Allergies  Current Outpatient Medications  Medication Sig Dispense Refill   acetaminophen (TYLENOL) 500 MG tablet Take 500 mg by mouth every 6 (six) hours as needed for mild pain or moderate pain.      albuterol (VENTOLIN HFA) 108 (90 Base) MCG/ACT inhaler Inhale 2 puffs into the lungs every 6 (six) hours as needed for wheezing or  shortness of breath. 6.7 g 3   amLODipine (NORVASC) 5 MG tablet Take 1 tablet (5 mg total) by mouth daily. 90 tablet 3   atorvastatin (LIPITOR) 80 MG tablet Take 1 tablet (80 mg total) by mouth daily. 90 tablet 3   baclofen (LIORESAL) 10 MG tablet Take 1 tablet (10 mg total) by mouth 2 (two) times daily. 30 each 0   cetirizine (ZYRTEC) 10 MG tablet Take 10 mg by mouth daily at 6 (six) AM.     cholecalciferol (VITAMIN D) 1000 units tablet Take 1,000 Units by mouth daily.     fluticasone (FLONASE) 50 MCG/ACT nasal spray Use 2 spray(s) in each nostril once daily 16 g 3   furosemide (LASIX) 20 MG tablet Take 1 tablet (20 mg total) by mouth daily as needed for up to 10 days for fluid. 10 tablet 0   gabapentin (NEURONTIN) 300 MG capsule Take 300-600 mg by mouth 4 (four) times daily as needed (neuropathic pain).      HYDROcodone-acetaminophen (NORCO) 5-325 MG tablet Take 1 tablet by mouth every 4 (four) hours as needed for moderate pain. 12 tablet 0   letrozole (FEMARA) 2.5 MG tablet Take 2.5 mg by mouth daily.     NARCAN 4 MG/0.1ML LIQD nasal spray kit Place 1 spray  into the nose.      Oxycodone HCl 10 MG TABS LIMIT ONE HALF TO ONE TABLET BY MOUTH 3 TO 5 TIMES DAILY IF TOLERATED     PREVIDENT 5000 DRY MOUTH 1.1 % GEL dental gel Place onto teeth at bedtime.     pyridoxine (B-6) 100 MG tablet Take 100 mg by mouth daily.     sertraline (ZOLOFT) 100 MG tablet Take 2 tablets (200 mg total) by mouth daily. 180 tablet 4   traZODone (DESYREL) 50 MG tablet Take 1-2 tablets (50-100 mg total) by mouth at bedtime as needed for sleep. 30 tablet 4   vitamin B-12 (CYANOCOBALAMIN) 500 MCG tablet Take 500 mcg by mouth daily.     No current facility-administered medications for this visit.   Facility-Administered Medications Ordered in Other Visits  Medication Dose Route Frequency Provider Last Rate Last Admin   heparin lock flush 100 unit/mL  500 Units Intravenous Once [name], MD       heparin lock flush 100 unit/mL  500 Units Intravenous Once [name], MD       sodium chloride flush (NS) 0.9 % injection 10 mL  10 mL Intravenous PRN [name], MD   10 mL at 02/27/19 5053    OBJECTIVE: There were no vitals filed for this visit.    There is no height or weight on file to calculate BMI.    ECOG FS:1 - Symptomatic but completely ambulatory  General: Well-developed, well-nourished, no acute distress. Eyes: Pink conjunctiva, anicteric sclera. HEENT: Normocephalic, moist mucous membranes. Lungs: No audible wheezing or coughing. Heart: Regular rate and rhythm. Abdomen: Soft, nontender, no obvious distention. Musculoskeletal: No edema, cyanosis, or clubbing. Neuro: Alert, answering all questions appropriately. Cranial nerves grossly intact. Skin: No rashes or petechiae noted. Psych: Normal affect.  LAB RESULTS:  Lab Results  Component Value Date   NA 142 03/24/2022   K 4.4 03/24/2022   CL 104 03/24/2022   CO2 26 03/24/2022   GLUCOSE 90 03/24/2022   BUN 14 03/24/2022   CREATININE 1.10 (H) 03/24/2022   CALCIUM 9.5 03/24/2022    PROT 6.4 03/18/2022   ALBUMIN 4.3 03/24/2022   AST 14  03/18/2022   ALT 15 03/18/2022   ALKPHOS 91 03/18/2022   BILITOT 0.8 03/18/2022   GFRNONAA 54 (L) 10/07/2021   GFRAA 57 (L) 02/26/2020    Lab Results  Component Value Date   WBC 10.3 03/18/2022   NEUTROABS 5.2 10/07/2021   HGB 11.3 03/18/2022   HCT 32.0 (L) 03/18/2022   MCV 80 03/18/2022   PLT 228 03/18/2022     STUDIES: DG Chest 2 View  Result Date: 03/11/2022 CLINICAL DATA:  Shortness of breath EXAM: CHEST - 2 VIEW COMPARISON:  Previous studies including the examination of 09/28/2021 FINDINGS: Transverse diameter of heart is in upper limits of normal. Thoracic aorta is tortuous and ectatic. Lung fields are clear of any infiltrate or pulmonary edema. There is no pleural effusion or pneumothorax. IMPRESSION: No active cardiopulmonary disease. Electronically Signed   By: Elmer Picker M.D.   On: 03/11/2022 15:19     ASSESSMENT: Stage IIa ER+ left breast cancer with no breast lesion and axillary lymph node metastasis. (TxN1M0)  HER-2 negative.  Now with stage IIIa colon cancer.  PLAN:   1.  Stage IIIa colon cancer: Pathology report reviewed independently.  Previously, patient agreed to enroll in clinical trial.  Oxaliplatin was discontinued early secondary to worsening neuropathy.  Patient completed 12 cycles of chemotherapy on July 31, 2019 and then completed maintenance Tecentriq on January 29, 2020.  Restaging Sof-Kling scan from September 28, 2021 reviewed independently and reported as above with no obvious evidence of recurrent or progressive disease.  CEA continues to be within normal limits at 4.3.  Colonoscopy on March 05, 2020 which only revealed a 4 mm benign polyp.  Patient was supposed to have colonoscopy in August 2022, but never scheduled.  She wishes to defer further until she is fully covered from her adenovirus infection.  Return to clinic in 6 months for laboratory work, repeat imaging, and further evaluation.     2. Stage IIa ER+ left breast cancer with no breast lesion and axillary lymph node metastasis: Despite no obvious breast lesion on mammogram or breast MRI, pathology and pattern of spread was consistent with primary breast cancer. CT and bone scan at time of diagnosis revealed no other evidence of malignancy.  She only received 3 cycles of Adriamycin and Cytoxan. Taxol was discontinued early as well secondary to persistent peripheral neuropathy. Patient completed her chemotherapy on Nov 19, 2014.  She elected not to pursue axillary node dissection given the potential morbidity of this procedure. It was also elected not to pursue adjuvant XRT given there was no primary breast lesion.  Patient completed approximately 4 years of treatment with letrozole, but this was discontinued secondary to enrolling in clinical trial for her newly diagnosed colon cancer.  Her most recent mammogram on July 17, 2021 was reported as BI-RADS 1.  Repeat in January 2024.  3.  Bone health: Patient's most recent bone mineral density on February 03, 2018 reported T score of -0.7 which is unchanged from 3 years prior.  Continue monitoring and evaluation per primary care.  4.  Lynch syndrome: Genetic testing confirmed patient has Lynch syndrome.  Likely maternal patient reports her mother had colon cancer at a very young age.  She reports her sister and son both have tested positive.  She reports her other children are not interested in genetic testing.  Appreciate genetics input.  Continue yearly colonoscopies as above.  5.  Endometrial cancer: Patient has had a total hysterectomy. 6.  Back pain/sciatica: Patient does  not complain of this today.  Continue monthly follow-up with pain clinic as scheduled.  Patient reports insurance will only allow 3 injections every 6 months.   7.  Peripheral neuropathy: Patient does not complain of this today.  Continue gabapentin 600 mg 4 times per day.  Continue follow-up with pain clinic as  scheduled.  She declined referral to neuro oncology. 8.  Anemia: Mild, monitor.  Patient's hemoglobin is 11.3. 9.  Renal insufficiency: Mild. 10.  Hypokalemia: Likely residual from diarrhea secondary to adenovirus.  Monitor.   Patient expressed understanding and was in agreement with this plan. She also understands that She can call clinic at any time with any questions, concerns, or complaints.   Breast cancer metastasized to axillary lymph node   Staging form: Breast, AJCC 7th Edition     Clinical stage from 10/22/2014: Stage Unknown (TX, N1, M0) - Signed by [name], MD on 10/22/2014   [name], MD   04/03/2022 8:56 AM

## 2022-04-06 ENCOUNTER — Ambulatory Visit: Admission: RE | Admit: 2022-04-06 | Payer: Medicare Other | Source: Ambulatory Visit

## 2022-04-06 ENCOUNTER — Other Ambulatory Visit: Payer: Self-pay

## 2022-04-06 ENCOUNTER — Inpatient Hospital Stay: Payer: Medicare Other | Attending: Oncology

## 2022-04-06 DIAGNOSIS — Z006 Encounter for examination for normal comparison and control in clinical research program: Secondary | ICD-10-CM | POA: Insufficient documentation

## 2022-04-06 DIAGNOSIS — N289 Disorder of kidney and ureter, unspecified: Secondary | ICD-10-CM | POA: Diagnosis not present

## 2022-04-06 DIAGNOSIS — Z8542 Personal history of malignant neoplasm of other parts of uterus: Secondary | ICD-10-CM | POA: Diagnosis not present

## 2022-04-06 DIAGNOSIS — Z87891 Personal history of nicotine dependence: Secondary | ICD-10-CM | POA: Insufficient documentation

## 2022-04-06 DIAGNOSIS — C184 Malignant neoplasm of transverse colon: Secondary | ICD-10-CM

## 2022-04-06 DIAGNOSIS — Z79899 Other long term (current) drug therapy: Secondary | ICD-10-CM | POA: Insufficient documentation

## 2022-04-06 DIAGNOSIS — Z853 Personal history of malignant neoplasm of breast: Secondary | ICD-10-CM | POA: Diagnosis not present

## 2022-04-06 DIAGNOSIS — G620 Drug-induced polyneuropathy: Secondary | ICD-10-CM | POA: Diagnosis not present

## 2022-04-06 DIAGNOSIS — M5430 Sciatica, unspecified side: Secondary | ICD-10-CM | POA: Insufficient documentation

## 2022-04-06 DIAGNOSIS — D649 Anemia, unspecified: Secondary | ICD-10-CM | POA: Diagnosis not present

## 2022-04-06 DIAGNOSIS — Z1509 Genetic susceptibility to other malignant neoplasm: Secondary | ICD-10-CM | POA: Insufficient documentation

## 2022-04-06 DIAGNOSIS — Z9221 Personal history of antineoplastic chemotherapy: Secondary | ICD-10-CM | POA: Insufficient documentation

## 2022-04-06 DIAGNOSIS — T451X5A Adverse effect of antineoplastic and immunosuppressive drugs, initial encounter: Secondary | ICD-10-CM | POA: Diagnosis not present

## 2022-04-06 DIAGNOSIS — R600 Localized edema: Secondary | ICD-10-CM | POA: Diagnosis not present

## 2022-04-06 DIAGNOSIS — C189 Malignant neoplasm of colon, unspecified: Secondary | ICD-10-CM | POA: Insufficient documentation

## 2022-04-06 LAB — COMPREHENSIVE METABOLIC PANEL
ALT: 18 U/L (ref 0–44)
AST: 22 U/L (ref 15–41)
Albumin: 3.7 g/dL (ref 3.5–5.0)
Alkaline Phosphatase: 68 U/L (ref 38–126)
Anion gap: 4 — ABNORMAL LOW (ref 5–15)
BUN: 13 mg/dL (ref 8–23)
CO2: 30 mmol/L (ref 22–32)
Calcium: 9.4 mg/dL (ref 8.9–10.3)
Chloride: 107 mmol/L (ref 98–111)
Creatinine, Ser: 1.12 mg/dL — ABNORMAL HIGH (ref 0.44–1.00)
GFR, Estimated: 53 mL/min — ABNORMAL LOW (ref >60)
Glucose, Bld: 94 mg/dL (ref 70–99)
Potassium: 3.6 mmol/L (ref 3.5–5.1)
Sodium: 141 mmol/L (ref 135–145)
Total Bilirubin: 0.8 mg/dL (ref 0.3–1.2)
Total Protein: 7.2 g/dL (ref 6.5–8.1)

## 2022-04-06 LAB — CBC WITH DIFFERENTIAL/PLATELET
Abs Immature Granulocytes: 0.02 10*3/uL (ref 0.00–0.07)
Basophils Absolute: 0 10*3/uL (ref 0.0–0.1)
Basophils Relative: 1 %
Eosinophils Absolute: 0.3 10*3/uL (ref 0.0–0.5)
Eosinophils Relative: 5 %
HCT: 33.7 % — ABNORMAL LOW (ref 36.0–46.0)
Hemoglobin: 11.5 g/dL — ABNORMAL LOW (ref 12.0–15.0)
Immature Granulocytes: 0 %
Lymphocytes Relative: 23 %
Lymphs Abs: 1.5 10*3/uL (ref 0.7–4.0)
MCH: 27.8 pg (ref 26.0–34.0)
MCHC: 34.1 g/dL (ref 30.0–36.0)
MCV: 81.6 fL (ref 80.0–100.0)
Monocytes Absolute: 0.6 10*3/uL (ref 0.1–1.0)
Monocytes Relative: 9 %
Neutro Abs: 3.9 10*3/uL (ref 1.7–7.7)
Neutrophils Relative %: 62 %
Platelets: 205 10*3/uL (ref 150–400)
RBC: 4.13 MIL/uL (ref 3.87–5.11)
RDW: 14.9 % (ref 11.5–15.5)
WBC: 6.2 10*3/uL (ref 4.0–10.5)
nRBC: 0 % (ref 0.0–0.2)

## 2022-04-07 ENCOUNTER — Ambulatory Visit
Admission: RE | Admit: 2022-04-07 | Discharge: 2022-04-07 | Disposition: A | Discharge status: Home / Self Care | Payer: Medicare Other | Source: Ambulatory Visit | Attending: Oncology | Admitting: Oncology

## 2022-04-07 DIAGNOSIS — J8410 Pulmonary fibrosis, unspecified: Secondary | ICD-10-CM | POA: Diagnosis not present

## 2022-04-07 DIAGNOSIS — I700 Atherosclerosis of aorta: Secondary | ICD-10-CM | POA: Diagnosis not present

## 2022-04-07 DIAGNOSIS — I3139 Other pericardial effusion (noninflammatory): Secondary | ICD-10-CM | POA: Diagnosis not present

## 2022-04-07 DIAGNOSIS — I739 Peripheral vascular disease, unspecified: Secondary | ICD-10-CM | POA: Diagnosis not present

## 2022-04-07 DIAGNOSIS — C189 Malignant neoplasm of colon, unspecified: Secondary | ICD-10-CM | POA: Diagnosis not present

## 2022-04-07 DIAGNOSIS — C184 Malignant neoplasm of transverse colon: Secondary | ICD-10-CM | POA: Insufficient documentation

## 2022-04-07 DIAGNOSIS — M47814 Spondylosis without myelopathy or radiculopathy, thoracic region: Secondary | ICD-10-CM | POA: Diagnosis not present

## 2022-04-07 DIAGNOSIS — I719 Aortic aneurysm of unspecified site, without rupture: Secondary | ICD-10-CM | POA: Diagnosis not present

## 2022-04-07 MED ORDER — IOHEXOL 300 MG/ML  SOLN
85.0000 mL | Freq: Once | INTRAMUSCULAR | Status: AC | PRN
  Administered 2022-04-07: 85 mL via INTRAVENOUS

## 2022-04-08 LAB — CEA: CEA: 3.1 ng/mL (ref 0.0–4.7)

## 2022-04-09 ENCOUNTER — Inpatient Hospital Stay: Payer: Medicare Other | Admitting: Oncology

## 2022-04-09 ENCOUNTER — Other Ambulatory Visit: Payer: Medicare (Managed Care)

## 2022-04-09 ENCOUNTER — Encounter: Payer: Self-pay | Admitting: Oncology

## 2022-04-09 ENCOUNTER — Ambulatory Visit: Payer: Self-pay | Admitting: *Deleted

## 2022-04-09 VITALS — BP 92/71 | HR 73 | Temp 97.1°F | Ht 63.0 in | Wt 222.0 lb

## 2022-04-09 DIAGNOSIS — Z87891 Personal history of nicotine dependence: Secondary | ICD-10-CM | POA: Diagnosis not present

## 2022-04-09 DIAGNOSIS — Z79899 Other long term (current) drug therapy: Secondary | ICD-10-CM | POA: Diagnosis not present

## 2022-04-09 DIAGNOSIS — N289 Disorder of kidney and ureter, unspecified: Secondary | ICD-10-CM | POA: Diagnosis not present

## 2022-04-09 DIAGNOSIS — Z006 Encounter for examination for normal comparison and control in clinical research program: Secondary | ICD-10-CM | POA: Diagnosis not present

## 2022-04-09 DIAGNOSIS — D649 Anemia, unspecified: Secondary | ICD-10-CM | POA: Diagnosis not present

## 2022-04-09 DIAGNOSIS — Z1211 Encounter for screening for malignant neoplasm of colon: Secondary | ICD-10-CM

## 2022-04-09 DIAGNOSIS — C184 Malignant neoplasm of transverse colon: Secondary | ICD-10-CM

## 2022-04-09 DIAGNOSIS — Z853 Personal history of malignant neoplasm of breast: Secondary | ICD-10-CM | POA: Diagnosis not present

## 2022-04-09 DIAGNOSIS — G620 Drug-induced polyneuropathy: Secondary | ICD-10-CM | POA: Diagnosis not present

## 2022-04-09 DIAGNOSIS — Z8542 Personal history of malignant neoplasm of other parts of uterus: Secondary | ICD-10-CM | POA: Diagnosis not present

## 2022-04-09 DIAGNOSIS — M5430 Sciatica, unspecified side: Secondary | ICD-10-CM | POA: Diagnosis not present

## 2022-04-09 DIAGNOSIS — Z1231 Encounter for screening mammogram for malignant neoplasm of breast: Secondary | ICD-10-CM

## 2022-04-09 DIAGNOSIS — R600 Localized edema: Secondary | ICD-10-CM | POA: Diagnosis not present

## 2022-04-09 DIAGNOSIS — R609 Edema, unspecified: Secondary | ICD-10-CM

## 2022-04-09 DIAGNOSIS — C189 Malignant neoplasm of colon, unspecified: Secondary | ICD-10-CM | POA: Diagnosis present

## 2022-04-09 DIAGNOSIS — Z9221 Personal history of antineoplastic chemotherapy: Secondary | ICD-10-CM | POA: Diagnosis not present

## 2022-04-09 DIAGNOSIS — Z1509 Genetic susceptibility to other malignant neoplasm: Secondary | ICD-10-CM | POA: Diagnosis not present

## 2022-04-09 DIAGNOSIS — T451X5A Adverse effect of antineoplastic and immunosuppressive drugs, initial encounter: Secondary | ICD-10-CM | POA: Diagnosis not present

## 2022-04-09 NOTE — Research (Signed)
RANDOMIZED TRIAL OF STANDARD CHEMOTHERAPY ALONE OR COMBINED WITH ATEZOLIZUMAB AS ADJUVANT THERAPY FOR PATIENTS WITH STAGE III COLON CANCER AND DEFICIENT DNA MISMATCH REPAIR   Patient in clinic this morning for her 36 month study follow up visit on the [id number] [name] study.  I met with patient in exam room 8 and prior to her visit with Dr. Grayland Ormond.  Labs: CEA is 3.1, within normal limits  H&P:  Patient met with Dr. Grayland Ormond who completed patient visit and answered patient's questions. Patient's v/s and weight were BP 92/71, P-73 and 222 lbs. Dr. Grayland Ormond encouraged her to call her primary care provider and let them know the fluid pills work but the swelling came back as soon as she stopped taking them. Dr. Grayland Ormond explained why she is having her lower extremity edema to the patient with all her questions answered.  AE's: Patient states she has ongoing fatigue, that continues most days, but is able to complete her daily ADL's without much difficulty. Patient denies; diarrhea, constipation, nausea, fever, UTI, abdominal pain. Patient does complain of peripheral neuropathy with numbness and tingling to her hands and feet, unchanged since her last visit. States this does not interfere with her ADL's, she adjusts to it.  Patient states she has been having swelling in her lower extremities lately. Her primary care provider put her on some fluid pills that worked as a trial, no prescription for anything currently. States her back and leg pain is continuing, as well as her lower extremity pain. States her dyspnea is continuing due to her chronic bronchitis, not currently being treated with any  medications. Denies nausea, diarrhea, fever, UTI's, vomiting or abdominal pain.  See additional solicited and other Adverse events with grade and attribution noted below. CT/MRI: CT completed 04/07/2022, Dr. Grayland Ormond reviewed results with patient states her scans are normal without evidence of disease recurrence.   Plan:  Patient will be scheduled to return to clinic for adverse events assessment at her 38-monthvisit. Dr. FGrayland Ormondinformed of the research 42 month follow up does not require a CT scan or any labs.  Dr. FGrayland Ormondwill schedule her for return appointment in 6 months with a mammogram in January and follow up with GI for colonoscopy,  depending on her when needed. Dr. FGrayland Ormondstates he will continue to obtain CT scans every 6 months for now. Patient denied having any questions at this time. Patient thanked for her time and encouraged to call with questions or concerns she may have prior to her next visit.   Adverse Event Log  Study/Protocol: [name] Cycle: 30 Month Visit Event Grade Onset Date Resolved Date Drug Name Attribution Treatment Comments  Diarrhea 0        Constipation 0        Nausea 0     unrelated    Fatigue 1 01/01/2020  09/05/2020    possible    Fatigue 1 09/28/2021 ongoing  Unrelated  Recent hospitalization with norovirus- SAE reported   Anorexia 1 07/13/2008 09/05/2020  unrelated    Cough 1 08/26/2020 10/07/2021  Unrelated  States due to bronchitis, treatment completed  Dyspnea 1 08/26/2020 03/01/2022 10/07/2021  unrelated  Due to bronchitis per the patient, no treatment required.  Fever 0        UTI 0        Palmer-plantar erythrodesia 0        Total biliruben 0        Neutrophils 0  TSH 0      Not checked 03/25/21  Sensory Peripheral Neuropathy 1 07/11/2019  ongoing   unrelated Gabapentin     Anemia 1 01/01/2020  02/26/2020     Recovered: Hgb 12.7 today  Anemia  1 03/25/21 ongoing    Hgb 11.7 today  Elevated creatinine 1 11/20/2019  09/05/2020  unrelated    Elevated Creatinine 1 03/25/21     Elevated at 1.12 today  Abdominal pain 0          Chronic back & left leg pain 2 07/13/2017 ongoing  unrelated    Pain- left side extremities 2 03/19/21 10/07/2021  unrelated    Edema: bilateral lower extremity 1 04/09/2022   Unrelated     Jeral Fruit, RN 04/09/2022  11:38 AM

## 2022-04-09 NOTE — Telephone Encounter (Signed)
  Chief Complaint: Feet Swelling Symptoms: Both feet and ankles swelling. Seen 03/24/22 for issue, placed on Lasix for 10 days.States helped with edema, started swelling again this past weekend when off lasix. Frequency: 5 days Pertinent Negatives: Patient denies SOB, fever, redness, warmth Disposition: '[]'$ ED /'[]'$ Urgent Care (no appt availability in office) / '[]'$ Appointment(In office/virtual)/ '[]'$  [place] [facility]/ '[]'$ Home Care/ '[]'$ Refused Recommended Disposition /'[]'$ [place] [facility]/ '[x]'$  Follow-up with PCP Additional Notes: No availability within protocol time frame. Care advise provided, pt verbalizes understanding. Please advise. Reason for Disposition  [1] MODERATE leg swelling (e.g., swelling extends up to knees) AND [2] new-onset or worsening  Answer Assessment - Initial Assessment Questions 1. ONSET: "When did the swelling start?" (e.g., minutes, hours, days)     Seen 03/24/22, "Got better for awhile." 2. LOCATION: "What part of the leg is swollen?"  "Are both legs swollen or just one leg?"     Both legs,ankles and feet 3. SEVERITY: "How bad is the swelling?" (e.g., localized; mild, moderate, severe)   - Localized: Small area of swelling localized to one leg.   - MILD pedal edema: Swelling limited to foot and ankle, pitting edema < 1/4 inch (6 mm) deep, rest and elevation eliminate most or all swelling.   - MODERATE edema: Swelling of lower leg to knee, pitting edema > 1/4 inch (6 mm) deep, rest and elevation only partially reduce swelling.   - SEVERE edema: Swelling extends above knee, facial or hand swelling present.      Moderate. 4. REDNESS: "Does the swelling look red or infected?"     No 5. PAIN: "Is the swelling painful to touch?" If Yes, ask: "How painful is it?"   (Scale 1-10; mild, moderate or severe)     9/10 6. FEVER: "Do you have a fever?" If Yes, ask: "What is it, how was it measured, and when did it start?"      no 7. CAUSE: "What do you think is causing the leg  swelling?"     Unsure 8. MEDICAL HISTORY: "Do you have a history of blood clots (e.g., DVT), cancer, heart failure, kidney disease, or liver failure?"      9. RECURRENT SYMPTOM: "Have you had leg swelling before?" If Yes, ask: "When was the last time?" "What happened that time?"     Yes 10. OTHER SYMPTOMS: "Do you have any other symptoms?" (e.g., chest pain, difficulty breathing)       HAs bronchitis  Protocols used: Leg Swelling and Edema-A-AH

## 2022-04-09 NOTE — Progress Notes (Signed)
[place]  Telephone:(336) [phone] Fax:(336) [phone]  ID: Norm Salt OB: 1950-12-27  MR#: [id number]  CLE#:[id number]  Patient Care Team: Birdie Sons, MD as PCP - General (Family Medicine) [name], MD as Consulting Physician (Oncology) [name], MD as Attending Physician (Pain Medicine) [name]., MD as Consulting Physician (Ophthalmology) [name], RN as Oncology Nurse Navigator [name], [name], MD as Referring Physician (Anesthesiology)  CHIEF COMPLAINT: Stage IIa ER+ left breast cancer with no breast lesion and axillary lymph node metastasis.  Now with stage IIIa colon cancer.  INTERVAL HISTORY: Patient returns to clinic today for routine 90-monthevaluation and discussion of her imaging results.  She has increased peripheral edema, but otherwise feels well.  Her peripheral neuropathy is chronic and unchanged.  She does not complain of back pain today.  She has no other neurologic complaints.  She denies any fevers.  She has no chest pain, shortness of breath, cough, or hemoptysis.  She denies any nausea, vomiting, constipation, or diarrhea.  She has no urinary complaints.  Patient offers no further specific complaints today.  REVIEW OF SYSTEMS:   Review of Systems  Constitutional:  Positive for malaise/fatigue. Negative for fever and weight loss.  Respiratory: Negative.  Negative for cough and shortness of breath.   Cardiovascular:  Positive for leg swelling. Negative for chest pain.  Gastrointestinal: Negative.  Negative for abdominal pain, blood in stool, constipation, diarrhea, melena, nausea and vomiting.  Genitourinary: Negative.  Negative for dysuria.  Musculoskeletal: Negative.  Negative for back pain, falls and neck pain.  Skin: Negative.  Negative for rash.  Neurological:  Positive for tingling, sensory change and weakness. Negative for dizziness, focal weakness and headaches.   Psychiatric/Behavioral: Negative.  Negative for depression. The patient is not nervous/anxious.    As per HPI. Otherwise, a complete review of systems is negative.  PAST MEDICAL HISTORY: Past Medical History:  Diagnosis Date   Allergy    Anemia    Arthritis    Back pain    [place]   LT LUMPECTOMY 12-2014 FOLLOWING CHEMO   Carpal tunnel syndrome    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   Cataract    Chronic pain    [place]   Depression    Dyspnea    Family history of breast cancer    Family history of colon cancer    GERD (gastroesophageal reflux disease)    problem with reflux during chemo   History of methicillin resistant staphylococcus aureus (MRSA)    Hypercholesteremia    Hypertension    Lumbosacral pain    sees pain management in [place]   Obesity    Personal history of chemotherapy 2016   left breast ca   Personal history of chemotherapy 2020   colon ca   Pinched nerve    left side, lumbar area   [place]    PAST SURGICAL HISTORY: Past Surgical History:  Procedure Laterality Date   ABDOMINAL HYSTERECTOMY  1994   UTERINE CA   AXILLARY LYMPH NODE BIOPSY Left 12/18/2014   Procedure: AXILLARY LYMPH NODE BIOPSY/;  Surgeon: JRobert Bellow MD;  Location: [facility];  Service: General;  Laterality: Left;   AXILLARY LYMPH NODE DISSECTION Left 12/18/2014   Procedure: AXILLARY LYMPH NODE DISSECTION;  Surgeon: JRobert Bellow MD;  Location: [facility];  Service: General;  Laterality: Left;   BREAST BIOPSY Left 05/2014   CORE BX  OF LN, METASTATIC ADENOCARCINOMA   BREAST LUMPECTOMY Left 12/2014   CHEMO FIRST THEN SURGERY OF LN   BREAST SURGERY     lymph node removal   CATARACT EXTRACTION W/PHACO Left 11/26/2020   Procedure: CATARACT EXTRACTION PHACO AND INTRAOCULAR LENS PLACEMENT ([place]) LEFT;  Surgeon: Birder Robson, MD;  Location: [place];  Service: Ophthalmology;  Laterality: Left;  6.40 00:43.9    CATARACT EXTRACTION W/PHACO Right 12/10/2020   Procedure: CATARACT EXTRACTION PHACO AND INTRAOCULAR LENS PLACEMENT (I[facility]) RIGHT;  Surgeon: Birder Robson, MD;  Location: [place];  Service: Ophthalmology;  Laterality: Right;  7.59 0:47.4   CHOLECYSTECTOMY N/A 04/19/2016   Procedure: LAPAROSCOPIC CHOLECYSTECTOMY WITH INTRAOPERATIVE CHOLANGIOGRAM;  Surgeon: Hubbard Robinson, MD;  Location: [facility];  Service: General;  Laterality: N/A;   COLON RESECTION Right 01/10/2019   Procedure: HAND ASSISTED LAPAROSCOPIC RIGHT COLON RESECTION;  Surgeon: Jules Husbands, MD;  Location: [facility];  Service: General;  Laterality: Right;   COLONOSCOPY WITH PROPOFOL N/A 12/30/2018   Procedure: COLONOSCOPY WITH PROPOFOL;  Surgeon: Lucilla Lame, MD;  Location: [place];  Service: Endoscopy;  Laterality: N/A;   COLONOSCOPY WITH PROPOFOL N/A 03/05/2020   Procedure: COLONOSCOPY WITH PROPOFOL;  Surgeon: Lucilla Lame, MD;  Location: [facility] END[facility]COPY;  Service: Endoscopy;  Laterality: N/A;   medial branch block  11/06/2015   lumbar facet Dr. Primus Bravo   POLYPECTOMY N/A 12/30/2018   Procedure: POLYPECTOMY;  Surgeon: Lucilla Lame, MD;  Location: [place];  Service: Endoscopy;  Laterality: N/A;  Clips placed at Hepatic Flexure Polyp (2) and Transverse Colon Polyp (4) removal sites   PORTACATH PLACEMENT Right 02/02/2019   Procedure: INSERTION PORT-A-CATH;  Surgeon: Jules Husbands, MD;  Location: [facility];  Service: General;  Laterality: Right;   RADIOFREQUENCY ABLATION     lumbar   SENTINEL NODE BIOPSY Left 12/18/2014   Procedure: SENTINEL NODE BIOPSY;  Surgeon: Robert Bellow, MD;  Location: [facility];  Service: General;  Laterality: Left;    FAMILY HISTORY Family History  Problem Relation Age of Onset   Breast cancer Sister 52   Colon cancer Sister    Colon cancer Mother        dx 55s   Hypertension Mother    Asthma Mother    Cancer Father        voice box removed   Cancer Maternal  Grandmother        unsure type   Colon cancer Cousin    Cancer Cousin        unsure type       ADVANCED DIRECTIVES:    HEALTH MAINTENANCE: Social History   Tobacco Use   Smoking status: Former    Packs/day: 0.50    Types: Cigarettes    Quit date: 07/18/2014    Years since quitting: 7.7   Smokeless tobacco: Never  Vaping Use   Vaping Use: Never used  Substance Use Topics   Alcohol use: No    Alcohol/week: 0.0 standard drinks of alcohol   Drug use: No    No Known Allergies  Current Outpatient Medications  Medication Sig Dispense Refill   acetaminophen (TYLENOL) 500 MG tablet Take 500 mg by mouth every 6 (six) hours as needed for mild pain or moderate pain.      albuterol (VENTOLIN HFA) 108 (90 Base) MCG/ACT inhaler Inhale 2 puffs into the lungs every 6 (six) hours as needed for wheezing or shortness of breath. 6.7 g 3   amLODipine (NORVASC) 5  MG tablet Take 1 tablet (5 mg total) by mouth daily. 90 tablet 3   atorvastatin (LIPITOR) 80 MG tablet Take 1 tablet (80 mg total) by mouth daily. 90 tablet 3   baclofen (LIORESAL) 10 MG tablet Take 1 tablet (10 mg total) by mouth 2 (two) times daily. 30 each 0   cetirizine (ZYRTEC) 10 MG tablet Take 10 mg by mouth daily at 6 (six) AM.     cholecalciferol (VITAMIN D) 1000 units tablet Take 1,000 Units by mouth daily.     fluticasone (FLONASE) 50 MCG/ACT nasal spray Use 2 spray(s) in each nostril once daily 16 g 3   gabapentin (NEURONTIN) 300 MG capsule Take 300-600 mg by mouth 4 (four) times daily as needed (neuropathic pain).      HYDROcodone-acetaminophen (NORCO) 5-325 MG tablet Take 1 tablet by mouth every 4 (four) hours as needed for moderate pain. 12 tablet 0   letrozole (FEMARA) 2.5 MG tablet Take 2.5 mg by mouth daily.     NARCAN 4 MG/0.1ML LIQD nasal spray kit Place 1 spray into the nose.      Oxycodone HCl 10 MG TABS LIMIT ONE HALF TO ONE TABLET BY MOUTH 3 TO 5 TIMES DAILY IF TOLERATED     PREVIDENT 5000 DRY MOUTH 1.1 % GEL  dental gel Place onto teeth at bedtime.     pyridoxine (B-6) 100 MG tablet Take 100 mg by mouth daily.     sertraline (ZOLOFT) 100 MG tablet Take 2 tablets (200 mg total) by mouth daily. 180 tablet 4   traZODone (DESYREL) 50 MG tablet Take 1-2 tablets (50-100 mg total) by mouth at bedtime as needed for sleep. 30 tablet 4   vitamin B-12 (CYANOCOBALAMIN) 500 MCG tablet Take 500 mcg by mouth daily.     furosemide (LASIX) 20 MG tablet Take 1 tablet (20 mg total) by mouth daily as needed for up to 10 days for fluid. 10 tablet 0   No current facility-administered medications for this visit.   Facility-Administered Medications Ordered in Other Visits  Medication Dose Route Frequency Provider Last Rate Last Admin   heparin lock flush 100 unit/mL  500 Units Intravenous Once [name], MD       heparin lock flush 100 unit/mL  500 Units Intravenous Once [name], MD       sodium chloride flush (NS) 0.9 % injection 10 mL  10 mL Intravenous PRN [name], MD   10 mL at 02/27/19 0925    OBJECTIVE: Vitals:   04/09/22 1020  BP: 92/71  Pulse: 73  Temp: (!) 97.1 F (36.2 C)  SpO2: 97%      Body mass index is 39.33 kg/m.    ECOG FS:1 - Symptomatic but completely ambulatory  General: Well-developed, well-nourished, no acute distress. Eyes: Pink conjunctiva, anicteric sclera. HEENT: Normocephalic, moist mucous membranes. Lungs: No audible wheezing or coughing. Heart: Regular rate and rhythm. Abdomen: Soft, nontender, no obvious distention. Musculoskeletal: No edema, cyanosis, or clubbing. Neuro: Alert, answering all questions appropriately. Cranial nerves grossly intact. Skin: No rashes or petechiae noted. Psych: Normal affect.   LAB RESULTS:  Lab Results  Component Value Date   NA 141 04/06/2022   K 3.6 04/06/2022   CL 107 04/06/2022   CO2 30 04/06/2022   GLUCOSE 94 04/06/2022   BUN 13 04/06/2022   CREATININE 1.12 (H) 04/06/2022   CALCIUM 9.4 04/06/2022    PROT 7.2 04/06/2022   ALBUMIN 3.7 04/06/2022   AST 22  04/06/2022   ALT 18 04/06/2022   ALKPHOS 68 04/06/2022   BILITOT 0.8 04/06/2022   GFRNONAA 53 (L) 04/06/2022   GFRAA 57 (L) 02/26/2020    Lab Results  Component Value Date   WBC 6.2 04/06/2022   NEUTROABS 3.9 04/06/2022   HGB 11.5 (L) 04/06/2022   HCT 33.7 (L) 04/06/2022   MCV 81.6 04/06/2022   PLT 205 04/06/2022     STUDIES: CT CHEST ABDOMEN PELVIS W CONTRAST  Result Date: 04/08/2022 CLINICAL DATA:  Restaging colon cancer.  * Tracking Code: BO * EXAM: CT CHEST, ABDOMEN, AND PELVIS WITH CONTRAST TECHNIQUE: Multidetector CT imaging of the chest, abdomen and pelvis was performed following the standard protocol during bolus administration of intravenous contrast. RADIATION DOSE REDUCTION: This exam was performed according to the departmental dose-optimization program which includes automated exposure control, adjustment of the mA and/or kV according to patient size and/or use of iterative reconstruction technique. CONTRAST:  25m OMNIPAQUE IOHEXOL 300 MG/ML  SOLN COMPARISON:  CT abdomen pelvis September 28, 2021; CT CAP September 23, 2021 FINDINGS: CT CHEST FINDINGS Cardiovascular: Normal heart size. Small pericardial effusion. Dilated ascending thoracic aorta measuring 4.4 cm. Thoracic aortic and coronary arterial vascular calcifications. Main pulmonary artery is normal in caliber. Mediastinum/Nodes: No enlarged axillary, mediastinal or hilar lymphadenopathy. Normal appearance of the esophagus. Small amount of oral contrast material within the esophagus. Lungs/Pleura: Central airways are patent. No large area pulmonary consolidation. Stable calcified granulomas within the left upper lobe. Stable 2 mm nodule within the lingula (image 89; series 3). No new or enlarging pulmonary nodules. No pleural effusion or pneumothorax. Musculoskeletal: Thoracic spine degenerative changes. No aggressive or acute appearing osseous lesions. CT ABDOMEN PELVIS  FINDINGS Hepatobiliary: Liver is normal in size and contour. No focal lesion. Prior cholecystectomy. No intrahepatic or extrahepatic biliary ductal dilatation. Pancreas: Unremarkable Spleen: Unremarkable Adrenals/Urinary Tract: The adrenal glands are normal. Kidneys enhance symmetrically with contrast. No hydronephrosis. Urinary bladder is decompressed. Stomach/Bowel: Oral contrast material to the level of the rectum. Ascending colectomy. Stable postsurgical changes. No evidence for bowel obstruction. No abdominal mass. No free fluid. Vascular/Lymphatic: Normal caliber abdominal aorta. Peripheral calcified atherosclerotic plaque. No retroperitoneal lymphadenopathy. Reproductive: Prior hysterectomy.  No pelvic masses. Other: None. Musculoskeletal: No aggressive or acute appearing osseous lesions. IMPRESSION: 1. No evidence for recurrent or metastatic disease within the chest, abdomen or pelvis. Stable postsurgical changes compatible with right hemicolectomy. 2. Ascending thoracic aorta measures 4.4 cm. Recommend annual imaging followup by CTA or MRA. This recommendation follows 2010 ACCF/AHA/AATS/ACR/ASA/SCA/SCAI/SIR/STS/SVM Guidelines for the Diagnosis and Management of Patients with Thoracic Aortic Disease. Circulation. 2010; 121:: B017-P102 Aortic aneurysm NOS (ICD10-I71.9) Electronically Signed   By: DLovey NewcomerM.D.   On: 04/08/2022 12:59   DG Chest 2 View  Result Date: 03/11/2022 CLINICAL DATA:  Shortness of breath EXAM: CHEST - 2 VIEW COMPARISON:  Previous studies including the examination of 09/28/2021 FINDINGS: Transverse diameter of heart is in upper limits of normal. Thoracic aorta is tortuous and ectatic. Lung fields are clear of any infiltrate or pulmonary edema. There is no pleural effusion or pneumothorax. IMPRESSION: No active cardiopulmonary disease. Electronically Signed   By: PElmer PickerM.D.   On: 03/11/2022 15:19     ASSESSMENT: Stage IIa ER+ left breast cancer with no breast  lesion and axillary lymph node metastasis. (TxN1M0)  HER-2 negative.  Now with stage IIIa colon cancer.  PLAN:   1.  Stage IIIa colon cancer: Pathology report reviewed independently.  Previously, patient agreed to  enroll in clinical trial.  Oxaliplatin was discontinued early secondary to worsening neuropathy.  Patient completed 12 cycles of chemotherapy on July 31, 2019 and then completed maintenance Tecentriq on January 29, 2020.  Restaging CT scan from March 11, 2022 reviewed independently and reported as above with no obvious evidence of recurrent or progressive disease.  CEA continues to be within normal limits.  Colonoscopy on March 05, 2020 which only revealed a 4 mm benign polyp.  A referral has been sent back to GI for repeat colonoscopy.  Return to clinic in 6 months with repeat laboratory work and imaging, and further evaluation.  At this point, patient will be 3 years removed from completing her chemotherapy and can be transition to yearly imaging.      2. Stage IIa ER+ left breast cancer with no breast lesion and axillary lymph node metastasis: Despite no obvious breast lesion on mammogram or breast MRI, pathology and pattern of spread was consistent with primary breast cancer. CT and bone scan at time of diagnosis revealed no other evidence of malignancy.  She only received 3 cycles of Adriamycin and Cytoxan. Taxol was discontinued early as well secondary to persistent peripheral neuropathy. Patient completed her chemotherapy on Nov 19, 2014.  She elected not to pursue axillary node dissection given the potential morbidity of this procedure. It was also elected not to pursue adjuvant XRT given there was no primary breast lesion.  Patient completed approximately 4 years of treatment with letrozole, but this was discontinued secondary to enrolling in clinical trial for her newly diagnosed colon cancer.  Her most recent mammogram on July 17, 2021 was reported as BI-RADS 1.  Repeat mammogram in  January 2024.  3.  Bone health: Patient's most recent bone mineral density on February 03, 2018 reported T score of -0.7 which is unchanged from 3 years prior.  Continue monitoring and evaluation per primary care.  4.  Lynch syndrome: Genetic testing confirmed patient has Lynch syndrome.  Likely maternal patient reports her mother had colon cancer at a very young age.  She reports her sister and son both have tested positive.  She reports her other children are not interested in genetic testing.  Appreciate genetics input.  Continue yearly colonoscopies as above.  5.  Endometrial cancer: Patient has had a total hysterectomy. 6.  Back pain/sciatica: Patient does not complain of this today.  Continue monthly follow-up with pain clinic as scheduled.  Patient reports insurance will only allow 3 injections every 6 months.   7.  Peripheral neuropathy: Chronic and unchanged.  Continue gabapentin 600 mg 4 times per day.  Continue follow-up with pain clinic as scheduled.  She previously declined referral to neuro oncology. 8.  Anemia: Chronic and unchanged.  Patient's hemoglobin is 11.5. 9.  Renal insufficiency: Can unchanged.  Patient's creatinine is 1.12.   10.  Hypokalemia: Resolved. 11.  Peripheral edema: Patient states her symptoms resolved when her primary care physician gave her a week supply of Lasix.  She has been instructed to follow-up with primary care to further discuss treatment.  Patient expressed understanding and was in agreement with this plan. She also understands that She can call clinic at any time with any questions, concerns, or complaints.   Breast cancer metastasized to axillary lymph node   Staging form: Breast, AJCC 7th Edition     Clinical stage from 10/22/2014: Stage Unknown (TX, N1, M0) - Signed by [name], MD on 10/22/2014   [name], MD  04/10/2022 12:19 PM

## 2022-04-10 ENCOUNTER — Encounter: Payer: Self-pay | Admitting: Oncology

## 2022-04-10 MED ORDER — FUROSEMIDE 20 MG PO TABS: 20.0000 mg | ORAL_TABLET | Freq: Every day | ORAL | 1 refills | Status: DC | PRN

## 2022-04-10 NOTE — Telephone Encounter (Signed)
Have sent prescription refill for furosemide, but is only to take as needed, should take any more than 3 times a week.

## 2022-04-10 NOTE — Addendum Note (Signed)
Addended by: Birdie Sons on: 04/10/2022 12:27 PM   Modules accepted: Orders

## 2022-04-13 ENCOUNTER — Other Ambulatory Visit: Payer: Self-pay

## 2022-04-13 ENCOUNTER — Telehealth: Payer: Self-pay

## 2022-04-13 DIAGNOSIS — C184 Malignant neoplasm of transverse colon: Secondary | ICD-10-CM

## 2022-04-13 MED ORDER — NA SULFATE-K SULFATE-MG SULF 17.5-3.13-1.6 GM/177ML PO SOLN: 1.0000 | Freq: Once | ORAL | 0 refills | Status: AC

## 2022-04-13 NOTE — Telephone Encounter (Signed)
Gastroenterology Pre-Procedure Review  Request Date: 05/12/22 Requesting Physician: Dr. Allen Norris  PATIENT REVIEW QUESTIONS: The patient responded to the following health history questions as indicated:    1. Are you having any GI issues? no 2. Do you have a personal history of Polyps? yes (history of colon cancer 2021 colonoscopy performed by Dr. Allen Norris 03/05/20) 3. Do you have a family history of Colon Cancer or Polyps? yes (grandmother colon cancer, and possibly mother) 43. Diabetes Mellitus? no 5. Joint replacements in the past 12 months?no 6. Major health problems in the past 3 months?no 7. Any artificial heart valves, MVP, or defibrillator?no    MEDICATIONS & ALLERGIES:    Patient reports the following regarding taking any anticoagulation/antiplatelet therapy:   Plavix, Coumadin, Eliquis, Xarelto, Lovenox, Pradaxa, Brilinta, or Effient? no Aspirin? no  Patient confirms/reports the following medications:  Current Outpatient Medications  Medication Sig Dispense Refill   acetaminophen (TYLENOL) 500 MG tablet Take 500 mg by mouth every 6 (six) hours as needed for mild pain or moderate pain.      albuterol (VENTOLIN HFA) 108 (90 Base) MCG/ACT inhaler Inhale 2 puffs into the lungs every 6 (six) hours as needed for wheezing or shortness of breath. 6.7 g 3   amLODipine (NORVASC) 5 MG tablet Take 1 tablet (5 mg total) by mouth daily. 90 tablet 3   atorvastatin (LIPITOR) 80 MG tablet Take 1 tablet (80 mg total) by mouth daily. 90 tablet 3   baclofen (LIORESAL) 10 MG tablet Take 1 tablet (10 mg total) by mouth 2 (two) times daily. 30 each 0   cetirizine (ZYRTEC) 10 MG tablet Take 10 mg by mouth daily at 6 (six) AM.     cholecalciferol (VITAMIN D) 1000 units tablet Take 1,000 Units by mouth daily.     fluticasone (FLONASE) 50 MCG/ACT nasal spray Use 2 spray(s) in each nostril once daily 16 g 3   furosemide (LASIX) 20 MG tablet Take 1 tablet (20 mg total) by mouth daily as needed for fluid. 20 tablet  1   gabapentin (NEURONTIN) 300 MG capsule Take 300-600 mg by mouth 4 (four) times daily as needed (neuropathic pain).      HYDROcodone-acetaminophen (NORCO) 5-325 MG tablet Take 1 tablet by mouth every 4 (four) hours as needed for moderate pain. 12 tablet 0   letrozole (FEMARA) 2.5 MG tablet Take 2.5 mg by mouth daily.     NARCAN 4 MG/0.1ML LIQD nasal spray kit Place 1 spray into the nose.      Oxycodone HCl 10 MG TABS LIMIT ONE HALF TO ONE TABLET BY MOUTH 3 TO 5 TIMES DAILY IF TOLERATED     PREVIDENT 5000 DRY MOUTH 1.1 % GEL dental gel Place onto teeth at bedtime.     pyridoxine (B-6) 100 MG tablet Take 100 mg by mouth daily.     sertraline (ZOLOFT) 100 MG tablet Take 2 tablets (200 mg total) by mouth daily. 180 tablet 4   traZODone (DESYREL) 50 MG tablet Take 1-2 tablets (50-100 mg total) by mouth at bedtime as needed for sleep. 30 tablet 4   vitamin B-12 (CYANOCOBALAMIN) 500 MCG tablet Take 500 mcg by mouth daily.     No current facility-administered medications for this visit.   Facility-Administered Medications Ordered in Other Visits  Medication Dose Route Frequency Provider Last Rate Last Admin   heparin lock flush 100 unit/mL  500 Units Intravenous Once [name], MD       heparin lock flush 100 unit/mL  500 Units Intravenous Once [name], MD       sodium chloride flush (NS) 0.9 % injection 10 mL  10 mL Intravenous PRN [name], MD   10 mL at 02/27/19 [id number]    Patient confirms/reports the following allergies:  No Known Allergies  No orders of the defined types were placed in this encounter.   AUTHORIZATION INFORMATION Primary Insurance: 1D#: Group #:  Secondary Insurance: 1D#: Group #:  SCHEDULE INFORMATION: Date: 05/12/22 Time: Location: [employer]

## 2022-04-14 ENCOUNTER — Ambulatory Visit: Payer: Medicare Other | Admitting: Oncology

## 2022-04-14 DIAGNOSIS — M25562 Pain in left knee: Secondary | ICD-10-CM | POA: Diagnosis not present

## 2022-04-14 DIAGNOSIS — G8929 Other chronic pain: Secondary | ICD-10-CM | POA: Diagnosis not present

## 2022-04-14 DIAGNOSIS — M25561 Pain in right knee: Secondary | ICD-10-CM | POA: Diagnosis not present

## 2022-04-14 DIAGNOSIS — M47816 Spondylosis without myelopathy or radiculopathy, lumbar region: Secondary | ICD-10-CM | POA: Diagnosis not present

## 2022-04-28 DIAGNOSIS — M25562 Pain in left knee: Secondary | ICD-10-CM | POA: Diagnosis not present

## 2022-04-28 DIAGNOSIS — M25561 Pain in right knee: Secondary | ICD-10-CM | POA: Diagnosis not present

## 2022-04-28 DIAGNOSIS — G8929 Other chronic pain: Secondary | ICD-10-CM | POA: Diagnosis not present

## 2022-04-30 ENCOUNTER — Encounter: Payer: Self-pay | Admitting: Physician Assistant

## 2022-04-30 ENCOUNTER — Ambulatory Visit (INDEPENDENT_AMBULATORY_CARE_PROVIDER_SITE_OTHER): Payer: Medicare Other | Admitting: Physician Assistant

## 2022-04-30 VITALS — BP 121/90 | HR 78 | Resp 16 | Wt 215.0 lb

## 2022-04-30 DIAGNOSIS — R21 Rash and other nonspecific skin eruption: Secondary | ICD-10-CM

## 2022-04-30 DIAGNOSIS — J309 Allergic rhinitis, unspecified: Secondary | ICD-10-CM | POA: Diagnosis not present

## 2022-04-30 MED ORDER — FLUTICASONE PROPIONATE 50 MCG/ACT NA SUSP: NASAL | 3 refills | Status: DC

## 2022-04-30 MED ORDER — ALUM SULFATE-CA ACETATE EX PACK: 1.0000 | PACK | Freq: Three times a day (TID) | CUTANEOUS | 12 refills | Status: DC

## 2022-04-30 NOTE — Patient Instructions (Addendum)
Treat to control symptoms and prevent complications.  Prompt analgesia may shorten the duration of zoster-associated pain. Calamine and colloidal oatmeal may help reduce itching and burning.  Lidocaine patch 5% (Lidoderm) applied over painful areas (limit three patches simultaneously or trim a single patch) for up to 12 hours may be effective. Capsaicin cream may be useful adjuncts.  Capsaicin 8% patch or plaster provides pain relief for patients with PHN   Gabapentin: 300 to 600 mg TID for pain; limited by adverse effects. Tried to increase to 2400 mg daily  Tricyclic antidepressants (TCAs); amitriptyline 10 to 25 mg at bedtime and other low-dose TCAs relieve pain acutely and may reduce pain duration; dose may be titrated up to 75 to 150 mg/day as tolerated. We might Rx if gabapentin dose will not help

## 2022-04-30 NOTE — Progress Notes (Signed)
I,April Miller,acting as a Education administrator for [employer], PA-C.,have documented all relevant documentation on the behalf of Mardene Speak, PA-C,as directed by  [employer], PA-C while in the presence of [employer], PA-C.   Established patient visit   Patient: Carrie Alvarez   DOB: 1951/01/02   71 y.o. Female  MRN: [id number] Visit Date: 04/30/2022  Today's healthcare provider: Mardene Speak, PA-C   Chief Complaint  Patient presents with   Rash   Subjective    HPI  Patient has had a rash on left side for around one week. Rash is red and painful. Also rash has formed blisters in some spot. Rash is isolated to one large area on her side.   Medications: Outpatient Medications Prior to Visit  Medication Sig   acetaminophen (TYLENOL) 500 MG tablet Take 500 mg by mouth every 6 (six) hours as needed for mild pain or moderate pain.    albuterol (VENTOLIN HFA) 108 (90 Base) MCG/ACT inhaler Inhale 2 puffs into the lungs every 6 (six) hours as needed for wheezing or shortness of breath.   amLODipine (NORVASC) 5 MG tablet Take 1 tablet (5 mg total) by mouth daily.   atorvastatin (LIPITOR) 80 MG tablet Take 1 tablet (80 mg total) by mouth daily.   baclofen (LIORESAL) 10 MG tablet Take 1 tablet (10 mg total) by mouth 2 (two) times daily.   cetirizine (ZYRTEC) 10 MG tablet Take 10 mg by mouth daily at 6 (six) AM.   cholecalciferol (VITAMIN D) 1000 units tablet Take 1,000 Units by mouth daily.   fluticasone (FLONASE) 50 MCG/ACT nasal spray Use 2 spray(s) in each nostril once daily   furosemide (LASIX) 20 MG tablet Take 1 tablet (20 mg total) by mouth daily as needed for fluid.   gabapentin (NEURONTIN) 300 MG capsule Take 300-600 mg by mouth 4 (four) times daily as needed (neuropathic pain).    HYDROcodone-acetaminophen (NORCO) 5-325 MG tablet Take 1 tablet by mouth every 4 (four) hours as needed for moderate pain.   letrozole (FEMARA) 2.5 MG tablet Take 2.5 mg by mouth daily.   NARCAN  4 MG/0.1ML LIQD nasal spray kit Place 1 spray into the nose.    Oxycodone HCl 10 MG TABS LIMIT ONE HALF TO ONE TABLET BY MOUTH 3 TO 5 TIMES DAILY IF TOLERATED   PREVIDENT 5000 DRY MOUTH 1.1 % GEL dental gel Place onto teeth at bedtime.   pyridoxine (B-6) 100 MG tablet Take 100 mg by mouth daily.   sertraline (ZOLOFT) 100 MG tablet Take 2 tablets (200 mg total) by mouth daily.   traZODone (DESYREL) 50 MG tablet Take 1-2 tablets (50-100 mg total) by mouth at bedtime as needed for sleep.   vitamin B-12 (CYANOCOBALAMIN) 500 MCG tablet Take 500 mcg by mouth daily.   Facility-Administered Medications Prior to Visit  Medication Dose Route Frequency Provider   heparin lock flush 100 unit/mL  500 Units Intravenous Once [name], MD   heparin lock flush 100 unit/mL  500 Units Intravenous Once [name], MD   sodium chloride flush (NS) 0.9 % injection 10 mL  10 mL Intravenous PRN [name], MD    Review of Systems  Constitutional:  Negative for appetite change, chills, fatigue and fever.  Respiratory:  Negative for chest tightness and shortness of breath.   Cardiovascular:  Negative for chest pain and palpitations.  Gastrointestinal:  Negative for abdominal pain, nausea and vomiting.  Skin:  Positive for rash.  Neurological:  Negative for dizziness and weakness.       Objective    BP (!) 121/90 (BP Location: Right Arm, Patient Position: Sitting, Cuff Size: Large)   Pulse 78   Resp 16   Wt 215 lb (97.5 kg)   SpO2 99%   BMI 38.09 kg/m    Physical Exam Vitals reviewed.  Constitutional:      General: She is not in acute distress.    Appearance: She is well-developed.  HENT:     Head: Normocephalic and atraumatic.  Eyes:     General: No scleral icterus.    Conjunctiva/sclera: Conjunctivae normal.  Cardiovascular:     Rate and Rhythm: Normal rate and regular rhythm.  Pulmonary:     Effort: Pulmonary effort is normal. No respiratory distress.  Skin:     General: Skin is warm and dry.     Findings: Rash (picture is attached to media) present.  Neurological:     Mental Status: She is alert and oriented to person, place, and time. Mental status is at baseline.  Psychiatric:        Behavior: Behavior normal.        Thought Content: Thought content normal.        Judgment: Judgment normal.   Ambulates with a cane   No results found for any visits on 04/30/22.  Assessment & Plan     1. Rash Could be herpes zoster considering dermatome representation, starting from small blisters, similar to the last episode of shingles some time ago. Could be a rare bullous form? Could be pemphigoid. Might need to adjust her current treatment  - Contacted pt this morning, she started to use topical as we discussed and she feels that she is doing better. - aluminum sulfate-calcium acetate (DOMEBORO) packet; Apply 1 packet topically 3 (three) times daily.  Dispense: 100 each; Refill: 12 - fluticasone (FLONASE) 50 MCG/ACT nasal spray; Use 2 spray(s) in each nostril once daily  Dispense: 16 g; Refill: 3  2. Allergic rhinitis, unspecified seasonality, unspecified trigger Per pt request, a refill for Flonase was sent - fluticasone (FLONASE) 50 MCG/ACT nasal spray; Use 2 spray(s) in each nostril once daily  Dispense: 16 g; Refill: 3  FU PRN     The patient was advised to call back or seek an in-person evaluation if the symptoms worsen or if the condition fails to improve as anticipated.  I discussed the assessment and treatment plan with the patient. The patient was provided an opportunity to ask questions and all were answered. The patient agreed with the plan and demonstrated an understanding of the instructions.  The entirety of the information documented in the History of Present Illness, Review of Systems and Physical Exam were personally obtained by me. Portions of this information were initially documented by the CMA and reviewed by me for thoroughness  and accuracy.  Portions of this note were created using dictation software and may contain typographical errors.        Total encounter time more than 30 minutes  Greater than 50% was spent in counseling and coordination of care with the patient   Elberta Leatherwood  [facility] [phone] (phone) [phone] (fax)  [place]

## 2022-05-12 ENCOUNTER — Other Ambulatory Visit: Payer: Self-pay

## 2022-05-12 ENCOUNTER — Encounter: Payer: Self-pay | Admitting: Gastroenterology

## 2022-05-12 ENCOUNTER — Ambulatory Visit: Payer: Medicare Other

## 2022-05-12 ENCOUNTER — Ambulatory Visit
Admission: RE | Admit: 2022-05-12 | Discharge: 2022-05-12 | Disposition: A | Discharge status: Home / Self Care | Payer: Medicare Other | Attending: Gastroenterology | Admitting: Gastroenterology

## 2022-05-12 ENCOUNTER — Other Ambulatory Visit: Payer: Self-pay | Admitting: Family Medicine

## 2022-05-12 ENCOUNTER — Encounter
Admission: RE | Disposition: A | Discharge status: Home / Self Care | Payer: Self-pay | Source: Home / Self Care | Attending: Gastroenterology

## 2022-05-12 DIAGNOSIS — F5101 Primary insomnia: Secondary | ICD-10-CM

## 2022-05-12 DIAGNOSIS — Z8542 Personal history of malignant neoplasm of other parts of uterus: Secondary | ICD-10-CM | POA: Diagnosis not present

## 2022-05-12 DIAGNOSIS — Z85038 Personal history of other malignant neoplasm of large intestine: Secondary | ICD-10-CM | POA: Diagnosis not present

## 2022-05-12 DIAGNOSIS — Z87891 Personal history of nicotine dependence: Secondary | ICD-10-CM | POA: Insufficient documentation

## 2022-05-12 DIAGNOSIS — Z980 Intestinal bypass and anastomosis status: Secondary | ICD-10-CM | POA: Insufficient documentation

## 2022-05-12 DIAGNOSIS — K639 Disease of intestine, unspecified: Secondary | ICD-10-CM | POA: Diagnosis not present

## 2022-05-12 DIAGNOSIS — D649 Anemia, unspecified: Secondary | ICD-10-CM | POA: Diagnosis not present

## 2022-05-12 DIAGNOSIS — K635 Polyp of colon: Secondary | ICD-10-CM

## 2022-05-12 DIAGNOSIS — K5730 Diverticulosis of large intestine without perforation or abscess without bleeding: Secondary | ICD-10-CM | POA: Insufficient documentation

## 2022-05-12 DIAGNOSIS — Z800 Family history of malignant neoplasm of digestive organs: Secondary | ICD-10-CM | POA: Insufficient documentation

## 2022-05-12 DIAGNOSIS — Z853 Personal history of malignant neoplasm of breast: Secondary | ICD-10-CM | POA: Insufficient documentation

## 2022-05-12 DIAGNOSIS — D124 Benign neoplasm of descending colon: Secondary | ICD-10-CM | POA: Insufficient documentation

## 2022-05-12 DIAGNOSIS — Z9221 Personal history of antineoplastic chemotherapy: Secondary | ICD-10-CM | POA: Insufficient documentation

## 2022-05-12 DIAGNOSIS — Z9049 Acquired absence of other specified parts of digestive tract: Secondary | ICD-10-CM | POA: Insufficient documentation

## 2022-05-12 DIAGNOSIS — E7800 Pure hypercholesterolemia, unspecified: Secondary | ICD-10-CM | POA: Diagnosis not present

## 2022-05-12 DIAGNOSIS — Z08 Encounter for follow-up examination after completed treatment for malignant neoplasm: Secondary | ICD-10-CM | POA: Diagnosis not present

## 2022-05-12 DIAGNOSIS — G629 Polyneuropathy, unspecified: Secondary | ICD-10-CM | POA: Diagnosis not present

## 2022-05-12 DIAGNOSIS — G8929 Other chronic pain: Secondary | ICD-10-CM | POA: Diagnosis not present

## 2022-05-12 DIAGNOSIS — Z6838 Body mass index (BMI) 38.0-38.9, adult: Secondary | ICD-10-CM | POA: Insufficient documentation

## 2022-05-12 DIAGNOSIS — K640 First degree hemorrhoids: Secondary | ICD-10-CM | POA: Insufficient documentation

## 2022-05-12 DIAGNOSIS — F32A Depression, unspecified: Secondary | ICD-10-CM | POA: Insufficient documentation

## 2022-05-12 DIAGNOSIS — E669 Obesity, unspecified: Secondary | ICD-10-CM | POA: Insufficient documentation

## 2022-05-12 DIAGNOSIS — I10 Essential (primary) hypertension: Secondary | ICD-10-CM | POA: Insufficient documentation

## 2022-05-12 DIAGNOSIS — C184 Malignant neoplasm of transverse colon: Secondary | ICD-10-CM | POA: Diagnosis not present

## 2022-05-12 DIAGNOSIS — D126 Benign neoplasm of colon, unspecified: Secondary | ICD-10-CM | POA: Diagnosis not present

## 2022-05-12 HISTORY — PX: COLONOSCOPY WITH PROPOFOL: SHX5780

## 2022-05-12 SURGERY — COLONOSCOPY WITH PROPOFOL
Anesthesia: General

## 2022-05-12 MED ORDER — PROPOFOL 1000 MG/100ML IV EMUL
INTRAVENOUS | Status: AC
  Filled 2022-05-12: qty 100

## 2022-05-12 MED ORDER — SODIUM CHLORIDE 0.9 % IV SOLN: INTRAVENOUS | Status: DC

## 2022-05-12 MED ORDER — LIDOCAINE 2% (20 MG/ML) 5 ML SYRINGE
INTRAMUSCULAR | Status: DC | PRN
  Administered 2022-05-12: 20 mg via INTRAVENOUS

## 2022-05-12 MED ORDER — PROPOFOL 10 MG/ML IV BOLUS
INTRAVENOUS | Status: DC | PRN
  Administered 2022-05-12: 70 mg via INTRAVENOUS

## 2022-05-12 MED ORDER — PROPOFOL 500 MG/50ML IV EMUL
INTRAVENOUS | Status: DC | PRN
  Administered 2022-05-12: 140 ug/kg/min via INTRAVENOUS

## 2022-05-12 NOTE — Transfer of Care (Signed)
Immediate Anesthesia Transfer of Care Note  Patient: Carrie Alvarez  Procedure(s) Performed: COLONOSCOPY WITH PROPOFOL  Patient Location: PACU and Endoscopy Unit  Anesthesia Type:General  Level of Consciousness: drowsy  Airway & Oxygen Therapy: Patient Spontanous Breathing  Post-op Assessment: Report given to RN  Post vital signs: Reviewed  Last Vitals:  Vitals Value Taken Time  BP 137/91 05/12/22 1105  Temp    Pulse 74 05/12/22 1106  Resp 10 05/12/22 1106  SpO2 100 % 05/12/22 1106  Vitals shown include unvalidated device data.  Last Pain:  Vitals:   05/12/22 1105  TempSrc:   PainSc: 0-No pain         Complications: No notable events documented.

## 2022-05-12 NOTE — Anesthesia Preprocedure Evaluation (Addendum)
Anesthesia Evaluation  Patient identified by MRN, date of birth, ID band Patient awake    Reviewed: Allergy & Precautions, NPO status , Patient's Chart, lab work & pertinent test results  Airway Mallampati: III  TM Distance: >3 FB Neck ROM: full    Dental  (+) Chipped   Pulmonary shortness of breath, former smoker,    Pulmonary exam normal        Cardiovascular hypertension, negative cardio ROS Normal cardiovascular exam     Neuro/Psych PSYCHIATRIC DISORDERS Depression  Neuromuscular disease    GI/Hepatic Neg liver ROS, GERD  ,  Endo/Other  negative endocrine ROS  Renal/GU negative Renal ROS  negative genitourinary   Musculoskeletal  (+) Arthritis ,   Abdominal   Peds  Hematology  (+) Blood dyscrasia, anemia ,   Anesthesia Other Findings Past Medical History: No date: Allergy No date: Anemia No date: Arthritis No date: Back pain 2015: Breast cancer ([place])     Comment:  LT LUMPECTOMY 12-2014 FOLLOWING CHEMO No date: Carpal tunnel syndrome     Comment:  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 No date: Cataract No date: Chronic pain 01/2019: Colon cancer ([place]) 2020: Colon cancer ([place]) No date: Depression No date: Dyspnea No date: Family history of breast cancer No date: Family history of colon cancer No date: GERD (gastroesophageal reflux disease)     Comment:  problem with reflux during chemo No date: History of methicillin resistant staphylococcus aureus (MRSA) No date: Hypercholesteremia No date: Hypertension No date: Lumbosacral pain     Comment:  sees pain management in [place] No date: Obesity 2016: Personal history of chemotherapy     Comment:  left breast ca 2020: Personal history of chemotherapy     Comment:  colon ca No date: Pinched nerve     Comment:  left side, lumbar area 1994: Uterine cancer ([place])  Past Surgical History: 1994: ABDOMINAL HYSTERECTOMY     Comment:  UTERINE  CA 12/18/2014: AXILLARY LYMPH NODE BIOPSY; Left     Comment:  Procedure: AXILLARY LYMPH NODE BIOPSY/;  Surgeon:               Robert Bellow, MD;  Location: [facility] ORS;  Service:               General;  Laterality: Left; 12/18/2014: AXILLARY LYMPH NODE DISSECTION; Left     Comment:  Procedure: AXILLARY LYMPH NODE DISSECTION;  Surgeon:               Robert Bellow, MD;  Location: [facility];  Service:               General;  Laterality: Left; 05/2014: BREAST BIOPSY; Left     Comment:  CORE BX OF LN, METASTATIC ADENOCARCINOMA 12/2014: BREAST LUMPECTOMY; Left     Comment:  CHEMO FIRST THEN SURGERY OF LN No date: BREAST SURGERY     Comment:  lymph node removal 11/26/2020: CATARACT EXTRACTION W/PHACO; Left     Comment:  Procedure: CATARACT EXTRACTION PHACO AND INTRAOCULAR               LENS PLACEMENT ([place]) LEFT;  Surgeon: Birder Robson,               MD;  Location: [place];  Service:               Ophthalmology;  Laterality: Left;  6.40 00:43.9 12/10/2020: CATARACT EXTRACTION W/PHACO; Right     Comment:  Procedure: CATARACT EXTRACTION PHACO  AND INTRAOCULAR               LENS PLACEMENT (I[facility]) RIGHT;  Surgeon: Birder Robson,               MD;  Location: [place];  Service:               Ophthalmology;  Laterality: Right;  7.59 0:47.4 04/19/2016: CHOLECYSTECTOMY; N/A     Comment:  Procedure: LAPAROSCOPIC CHOLECYSTECTOMY WITH               INTRAOPERATIVE CHOLANGIOGRAM;  Surgeon: Hubbard Robinson, MD;  Location: [facility];  Service: General;                Laterality: N/A; 01/10/2019: COLON RESECTION; Right     Comment:  Procedure: HAND ASSISTED LAPAROSCOPIC RIGHT COLON               RESECTION;  Surgeon: Jules Husbands, MD;  Location: [facility]               ORS;  Service: General;  Laterality: Right; 12/30/2018: COLONOSCOPY WITH PROPOFOL; N/A     Comment:  Procedure: COLONOSCOPY WITH PROPOFOL;  Surgeon: Lucilla Lame, MD;  Location: [place];  Service:               Endoscopy;  Laterality: N/A; 03/05/2020: COLONOSCOPY WITH PROPOFOL; N/A     Comment:  Procedure: COLONOSCOPY WITH PROPOFOL;  Surgeon: Lucilla Lame, MD;  Location: [facility];  Service:               Endoscopy;  Laterality: N/A; 11/06/2015: medial branch block     Comment:  lumbar facet Dr. Primus Bravo 12/30/2018: POLYPECTOMY; N/A     Comment:  Procedure: POLYPECTOMY;  Surgeon: Lucilla Lame, MD;                Location: [place];  Service: Endoscopy;                Laterality: N/A;  Clips placed at Hepatic Flexure Polyp               (2) and Transverse Colon Polyp (4) removal sites 02/02/2019: PORTACATH PLACEMENT; Right     Comment:  Procedure: INSERTION PORT-A-CATH;  Surgeon: Jules Husbands, MD;  Location: [facility];  Service: General;                Laterality: Right; No date: RADIOFREQUENCY ABLATION     Comment:  lumbar 12/18/2014: SENTINEL NODE BIOPSY; Left     Comment:  Procedure: SENTINEL NODE BIOPSY;  Surgeon: Robert Bellow, MD;  Location: [facility];  Service: General;                Laterality: Left;     Reproductive/Obstetrics negative OB ROS                            Anesthesia Physical Anesthesia Plan  ASA: 3  Anesthesia Plan: General   Post-op Pain Management:    Induction: Intravenous  PONV Risk Score and Plan: Propofol infusion and TIVA  Airway Management Planned: Natural Airway and Nasal Cannula  Additional Equipment:   Intra-op Plan:   Post-operative Plan:   Informed Consent: I have reviewed the patients History and Physical, chart, labs and discussed the procedure including the risks, benefits and alternatives for the proposed anesthesia with the patient or authorized representative who has indicated his/her understanding and acceptance.     Dental Advisory Given  Plan Discussed with: Anesthesiologist, CRNA and Surgeon  Anesthesia Plan  Comments: (Patient consented for risks of anesthesia including but not limited to:  - adverse reactions to medications - risk of airway placement if required - damage to eyes, teeth, lips or other oral mucosa - nerve damage due to positioning  - sore throat or hoarseness - Damage to heart, brain, nerves, lungs, other parts of body or loss of life  Patient voiced understanding.)        Anesthesia Quick Evaluation

## 2022-05-12 NOTE — H&P (Signed)
Lucilla Lame, MD [address], [place] [address] [phone] Fax : [phone]  Primary Care Physician:  Birdie Sons, MD Primary Gastroenterologist:  Dr. Allen Norris  Pre-Procedure History & Physical: HPI:  Carrie Alvarez is a 71 y.o. female is here for an colonoscopy.   Past Medical History:  Diagnosis Date   Allergy    Anemia    Arthritis    Back pain    Breast cancer ([place]) 2015   LT LUMPECTOMY 12-2014 FOLLOWING CHEMO   Carpal tunnel syndrome    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   Cataract    Chronic pain    Colon cancer ([place]) 01/2019   Colon cancer ([place]) 2020   Depression    Dyspnea    Family history of breast cancer    Family history of colon cancer    GERD (gastroesophageal reflux disease)    problem with reflux during chemo   History of methicillin resistant staphylococcus aureus (MRSA)    Hypercholesteremia    Hypertension    Lumbosacral pain    sees pain management in [place]   Obesity    Personal history of chemotherapy 2016   left breast ca   Personal history of chemotherapy 2020   colon ca   Pinched nerve    left side, lumbar area   Uterine cancer ([place]) 1994    Past Surgical History:  Procedure Laterality Date   ABDOMINAL HYSTERECTOMY  1994   UTERINE CA   AXILLARY LYMPH NODE BIOPSY Left 12/18/2014   Procedure: AXILLARY LYMPH NODE BIOPSY/;  Surgeon: Robert Bellow, MD;  Location: [facility];  Service: General;  Laterality: Left;   AXILLARY LYMPH NODE DISSECTION Left 12/18/2014   Procedure: AXILLARY LYMPH NODE DISSECTION;  Surgeon: Robert Bellow, MD;  Location: [facility];  Service: General;  Laterality: Left;   BREAST BIOPSY Left 05/2014   CORE BX OF LN, METASTATIC ADENOCARCINOMA   BREAST LUMPECTOMY Left 12/2014   CHEMO FIRST THEN SURGERY OF LN   BREAST SURGERY     lymph node removal   CATARACT EXTRACTION W/PHACO Left 11/26/2020   Procedure: CATARACT EXTRACTION PHACO AND INTRAOCULAR LENS PLACEMENT ([place])  LEFT;  Surgeon: Birder Robson, MD;  Location: [place];  Service: Ophthalmology;  Laterality: Left;  6.40 00:43.9   CATARACT EXTRACTION W/PHACO Right 12/10/2020   Procedure: CATARACT EXTRACTION PHACO AND INTRAOCULAR LENS PLACEMENT ([facility]) RIGHT;  Surgeon: Birder Robson, MD;  Location: [place];  Service: Ophthalmology;  Laterality: Right;  7.59 0:47.4   CHOLECYSTECTOMY N/A 04/19/2016   Procedure: LAPAROSCOPIC CHOLECYSTECTOMY WITH INTRAOPERATIVE CHOLANGIOGRAM;  Surgeon: Hubbard Robinson, MD;  Location: [facility];  Service: General;  Laterality: N/A;   COLON RESECTION Right 01/10/2019   Procedure: HAND ASSISTED LAPAROSCOPIC RIGHT COLON RESECTION;  Surgeon: Jules Husbands, MD;  Location: [facility];  Service: General;  Laterality: Right;   COLONOSCOPY WITH PROPOFOL N/A 12/30/2018   Procedure: COLONOSCOPY WITH PROPOFOL;  Surgeon: Lucilla Lame, MD;  Location: [place];  Service: Endoscopy;  Laterality: N/A;   COLONOSCOPY WITH PROPOFOL N/A 03/05/2020   Procedure: COLONOSCOPY WITH PROPOFOL;  Surgeon: Lucilla Lame, MD;  Location[facility] END[facility]COPY;  Service: Endoscopy;  Laterality: N/A;   medial branch block  11/06/2015   lumbar facet Dr. Primus Bravo   POLYPECTOMY N/A 12/30/2018   Procedure: POLYPECTOMY;  Surgeon: Lucilla Lame, MD;  Location: [place];  Service: Endoscopy;  Laterality: N/A;  Clips placed at Hepatic Flexure Polyp (2) and Transverse  Colon Polyp (4) removal sites   PORTACATH PLACEMENT Right 02/02/2019   Procedure: INSERTION PORT-A-CATH;  Surgeon: Jules Husbands, MD;  Location: [facility];  Service: General;  Laterality: Right;   RADIOFREQUENCY ABLATION     lumbar   SENTINEL NODE BIOPSY Left 12/18/2014   Procedure: SENTINEL NODE BIOPSY;  Surgeon: Robert Bellow, MD;  Location: [facility];  Service: General;  Laterality: Left;    Prior to Admission medications   Medication Sig Start Date End Date Taking? Authorizing Provider  amLODipine (NORVASC) 5 MG  tablet Take 1 tablet (5 mg total) by mouth daily. 02/16/22  Yes Birdie Sons, MD  atorvastatin (LIPITOR) 80 MG tablet Take 1 tablet (80 mg total) by mouth daily. 02/16/22  Yes Birdie Sons, MD  cetirizine (ZYRTEC) 10 MG tablet Take 10 mg by mouth daily at 6 (six) AM.   Yes [provider]  furosemide (LASIX) 20 MG tablet Take 1 tablet (20 mg total) by mouth daily as needed for fluid. 04/10/22  Yes Birdie Sons, MD  gabapentin (NEURONTIN) 300 MG capsule Take 300-600 mg by mouth 4 (four) times daily as needed (neuropathic pain).    Yes [provider]  Oxycodone HCl 10 MG TABS LIMIT ONE HALF TO ONE TABLET BY MOUTH 3 TO 5 TIMES DAILY IF TOLERATED 02/21/19  Yes [provider]  acetaminophen (TYLENOL) 500 MG tablet Take 500 mg by mouth every 6 (six) hours as needed for mild pain or moderate pain.     [provider]  albuterol (VENTOLIN HFA) 108 (90 Base) MCG/ACT inhaler Inhale 2 puffs into the lungs every 6 (six) hours as needed for wheezing or shortness of breath. 03/02/22   Birdie Sons, MD  aluminum sulfate-calcium acetate [facility]) packet Apply 1 packet topically 3 (three) times daily. 04/30/22   Ostwalt, Letitia Libra, PA-C  baclofen (LIORESAL) 10 MG tablet Take 1 tablet (10 mg total) by mouth 2 (two) times daily. 10/28/17   Trinna Post, PA-C  cholecalciferol (VITAMIN D) 1000 units tablet Take 1,000 Units by mouth daily.    [provider]  fluticasone Asencion Islam) 50 MCG/ACT nasal spray Use 2 spray(s) in each nostril once daily 04/30/22   Ostwalt, Letitia Libra, PA-C  HYDROcodone-acetaminophen (NORCO) 5-325 MG tablet Take 1 tablet by mouth every 4 (four) hours as needed for moderate pain. 03/19/21   Naaman Plummer, MD  letrozole [facility]) 2.5 MG tablet Take 2.5 mg by mouth daily. 04/07/19   [provider]  NARCAN 4 MG/0.1ML LIQD nasal spray kit Place 1 spray into the nose.  06/06/19   [provider]  PREVIDENT 5000 DRY MOUTH 1.1 % GEL dental  gel Place onto teeth at bedtime. 06/28/20   [provider]  pyridoxine (B-6) 100 MG tablet Take 100 mg by mouth daily.    [provider]  sertraline (ZOLOFT) 100 MG tablet Take 2 tablets (200 mg total) by mouth daily. 03/03/22   Birdie Sons, MD  traZODone (DESYREL) 50 MG tablet TAKE 1-2 TABLETS BY MOUTH AT BEDTIME AS NEEDED FOR SLEEP. 05/12/22   Birdie Sons, MD  vitamin B-12 (CYANOCOBALAMIN) 500 MCG tablet Take 500 mcg by mouth daily.    [provider]    Allergies as of 04/13/2022   (No Known Allergies)    Family History  Problem Relation Age of Onset   Breast cancer Sister 38   Colon cancer Sister    Colon cancer Mother  dx 16s   Hypertension Mother    Asthma Mother    Cancer Father        voice box removed   Cancer Maternal Grandmother        unsure type   Colon cancer Cousin    Cancer Cousin        unsure type    Social History   Socioeconomic History   Marital status: Married    Spouse name: Vicente Serene   Number of children: 3   Years of education: Not on file   Highest education level: Some college, no degree  Occupational History   Occupation: retired    Comment: was on disability  Tobacco Use   Smoking status: Former    Packs/day: 0.50    Types: Cigarettes    Quit date: 07/18/2014    Years since quitting: 7.8   Smokeless tobacco: Never  Vaping Use   Vaping Use: Never used  Substance and Sexual Activity   Alcohol use: No    Alcohol/week: 0.0 standard drinks of alcohol   Drug use: No   Sexual activity: Not on file  Other Topics Concern   Not on file  Social History Narrative   Husband and patient have not lived together for 10 years.  He is still helpful with patient.   Social Determinants of Health   Financial Resource Strain: Low Risk  (10/09/2021)   Overall Financial Resource Strain (CARDIA)    Difficulty of Paying Living Expenses: Not hard at all  Food Insecurity: No Food Insecurity (10/09/2021)   Hunger  Vital Sign    Worried About Running Out of Food in the Last Year: Never true    Ran Out of Food in the Last Year: Never true  Transportation Needs: No Transportation Needs (10/09/2021)   PRAPARE - Hydrologist (Medical): No    Lack of Transportation (Non-Medical): No  Physical Activity: Insufficiently Active (10/09/2021)   Exercise Vital Sign    Days of Exercise per Week: 2 days    Minutes of Exercise per Session: 20 min  Stress: No Stress Concern Present (10/09/2021)   [place]    Feeling of Stress : Only a little  Social Connections: Moderately Integrated (10/09/2021)   Social Connection and Isolation Panel [NHANES]    Frequency of Communication with Friends and Family: More than three times a week    Frequency of Social Gatherings with Friends and Family: More than three times a week    Attends Religious Services: More than 4 times per year    Active Member of [employer] or Organizations: No    Attends Archivist Meetings: Never    Marital Status: Married  Human resources officer Violence: Not At Risk (10/09/2021)   Humiliation, Afraid, Rape, and Kick questionnaire    Fear of Current or Ex-Partner: No    Emotionally Abused: No    Physically Abused: No    Sexually Abused: No    Review of Systems: See HPI, otherwise negative ROS  Physical Exam: BP 138/77   Pulse 83   Temp (!) 97 F (36.1 C) (Temporal)   Resp 13   Ht _0  (1.6 m)   Wt 97.5 kg   SpO2 100%   BMI 38.09 kg/m  General:   Alert,  pleasant and cooperative in NAD Head:  Normocephalic and atraumatic. Neck:  Supple; no masses or thyromegaly. Lungs:  Clear throughout to auscultation.    Heart:  Regular rate and rhythm. Abdomen:  Soft, nontender and nondistended. Normal bowel sounds, without guarding, and without rebound.   Neurologic:  Alert and  oriented x4;  grossly normal neurologically.  Impression/Plan: Carrie Alvarez is here for an colonoscopy to be performed for personal history of colon cancer last colonoscopy 2021  Risks, benefits, limitations, and alternatives regarding  colonoscopy have been reviewed with the patient.  Questions have been answered.  All parties agreeable.   Lucilla Lame, MD  05/12/2022, 10:20 AM

## 2022-05-12 NOTE — Anesthesia Postprocedure Evaluation (Signed)
Anesthesia Post Note  Patient: Norm Salt  Procedure(s) Performed: COLONOSCOPY WITH PROPOFOL  Patient location during evaluation: Endoscopy Anesthesia Type: General Level of consciousness: awake and alert Pain management: pain level controlled Vital Signs Assessment: post-procedure vital signs reviewed and stable Respiratory status: spontaneous breathing, nonlabored ventilation and respiratory function stable Cardiovascular status: blood pressure returned to baseline and stable Postop Assessment: no apparent nausea or vomiting Anesthetic complications: no   No notable events documented.   Last Vitals:  Vitals:   05/12/22 1109 05/12/22 1119  BP:  (!) 129/99  Pulse:    Resp: 16   Temp:    SpO2:      Last Pain:  Vitals:   05/12/22 1105  TempSrc:   PainSc: 0-No pain                 Alphonsus Sias

## 2022-05-12 NOTE — Op Note (Signed)
[facility] Gastroenterology Patient Name: Carrie Alvarez Procedure Date: 05/12/2022 10:45 AM MRN: [id number] Account #: [id number] Date of Birth: 1951/04/30 Admit Type: Outpatient Age: 71 Room: [facility] ENDO ROOM 4 Gender: Female Note Status: Finalized Instrument Name: Park [id number] Procedure:             Colonoscopy Indications:           High risk colon cancer surveillance: Personal history                         of colon cancer Providers:             Lucilla Lame MD, MD Referring MD:          Lucilla Lame MD, MD (Referring MD) Medicines:             Propofol per Anesthesia Complications:         No immediate complications. Procedure:             Pre-Anesthesia Assessment:                        - Prior to the procedure, a History and Physical was                         performed, and patient medications and allergies were                         reviewed. The patient's tolerance of previous                         anesthesia was also reviewed. The risks and benefits                         of the procedure and the sedation options and risks                         were discussed with the patient. All questions were                         answered, and informed consent was obtained. Prior                         Anticoagulants: The patient has taken no anticoagulant                         or antiplatelet agents. ASA Grade Assessment: II - A                         patient with mild systemic disease. After reviewing                         the risks and benefits, the patient was deemed in                         satisfactory condition to undergo the procedure.                        After obtaining informed consent, the colonoscope was  passed under direct vision. Throughout the procedure,                         the patient's blood pressure, pulse, and oxygen                         saturations were monitored continuously. The                          Colonoscope was introduced through the anus and                         advanced to the the ileocolonic anastomosis. The                         colonoscopy was performed without difficulty. The                         patient tolerated the procedure well. The quality of                         the bowel preparation was adequate to identify polyps. Findings:      The perianal and digital rectal examinations were normal.      Two sessile polyps were found in the descending colon. The polyps were 4       to 5 mm in size. These polyps were removed with a cold snare. Resection       and retrieval were complete.      Many small-mouthed diverticula were found in the sigmoid colon.      Non-bleeding internal hemorrhoids were found during retroflexion. The       hemorrhoids were Grade I (internal hemorrhoids that do not prolapse). Impression:            - Two 4 to 5 mm polyps in the descending colon,                         removed with a cold snare. Resected and retrieved.                        - Diverticulosis in the sigmoid colon.                        - Non-bleeding internal hemorrhoids. Recommendation:        - Discharge patient to home.                        - Resume previous diet.                        - Continue present medications.                        - Repeat colonoscopy in 5 years for surveillance. Procedure Code(s):     --- Professional ---                        [phone], Colonoscopy, flexible; with removal of  tumor(s), polyp(s), or other lesion(s) by snare                         technique Diagnosis Code(s):     --- Professional ---                        Z60.109, Personal history of other malignant neoplasm                         of large intestine                        D12.4, Benign neoplasm of descending colon CPT copyright 2022 American Medical Association. All rights reserved. The codes documented in this report are  preliminary and upon coder review may  be revised to meet current compliance requirements. Lucilla Lame MD, MD 05/12/2022 11:05:04 AM This report has been signed electronically. Number of Addenda: 0 Note Initiated On: 05/12/2022 10:45 AM Scope Withdrawal Time: 0 hours 3 minutes 4 seconds  Total Procedure Duration: 0 hours 6 minutes 10 seconds  Estimated Blood Loss:  Estimated blood loss: none.      [facility]

## 2022-05-13 ENCOUNTER — Encounter: Payer: Self-pay | Admitting: Gastroenterology

## 2022-05-13 LAB — SURGICAL PATHOLOGY

## 2022-06-08 ENCOUNTER — Other Ambulatory Visit: Payer: Self-pay | Admitting: Family Medicine

## 2022-06-08 DIAGNOSIS — F5101 Primary insomnia: Secondary | ICD-10-CM

## 2022-06-08 DIAGNOSIS — M1711 Unilateral primary osteoarthritis, right knee: Secondary | ICD-10-CM | POA: Diagnosis not present

## 2022-06-22 DIAGNOSIS — M1711 Unilateral primary osteoarthritis, right knee: Secondary | ICD-10-CM | POA: Diagnosis not present

## 2022-06-22 DIAGNOSIS — G8929 Other chronic pain: Secondary | ICD-10-CM | POA: Diagnosis not present

## 2022-06-22 DIAGNOSIS — M25561 Pain in right knee: Secondary | ICD-10-CM | POA: Diagnosis not present

## 2022-07-09 ENCOUNTER — Other Ambulatory Visit: Payer: Self-pay | Admitting: Family Medicine

## 2022-07-09 DIAGNOSIS — J40 Bronchitis, not specified as acute or chronic: Secondary | ICD-10-CM

## 2022-07-15 DIAGNOSIS — H26491 Other secondary cataract, right eye: Secondary | ICD-10-CM | POA: Diagnosis not present

## 2022-07-15 DIAGNOSIS — M3501 Sicca syndrome with keratoconjunctivitis: Secondary | ICD-10-CM | POA: Diagnosis not present

## 2022-07-15 DIAGNOSIS — Z961 Presence of intraocular lens: Secondary | ICD-10-CM | POA: Diagnosis not present

## 2022-07-17 DIAGNOSIS — H26491 Other secondary cataract, right eye: Secondary | ICD-10-CM | POA: Diagnosis not present

## 2022-07-20 ENCOUNTER — Ambulatory Visit
Admission: RE | Admit: 2022-07-20 | Discharge: 2022-07-20 | Disposition: A | Discharge status: Home / Self Care | Payer: Medicare Other | Source: Ambulatory Visit | Attending: Oncology | Admitting: Oncology

## 2022-07-20 DIAGNOSIS — Z1231 Encounter for screening mammogram for malignant neoplasm of breast: Secondary | ICD-10-CM | POA: Insufficient documentation

## 2022-07-20 DIAGNOSIS — C184 Malignant neoplasm of transverse colon: Secondary | ICD-10-CM | POA: Diagnosis not present

## 2022-07-21 ENCOUNTER — Encounter: Payer: Self-pay | Admitting: Oncology

## 2022-07-21 NOTE — Progress Notes (Signed)
I,Joseline E Rosas,acting as a scribe for [employer], PA-C.,have documented all relevant documentation on the behalf of [name], PA-C,as directed by  [name], PA-C while in the presence of [name], PA-C.   Established patient visit   Patient: Carrie Alvarez   DOB: [birth year]   72 y.o. Female  MRN: [id number] Visit Date: 07/22/2022  Today's healthcare provider: [name], PA-C   Chief Complaint  Patient presents with   Cough   Diarrhea   Subjective     Pt reports a severe cough with shortness of breath for the last 2-3 weeks. She reports fatigue, malaise. Denies fevers. She has an albuterol inhaler that she has been using TID with some improvement. She tried Catering manager, nyquil and dayquil with no relief.  About a week ago she started having diarrhea 2-4 times a day, after eating. Denies blood in stool. Reports urgency.  Denies abdominal pain or cramping.   Medications: Outpatient Medications Prior to Visit  Medication Sig   acetaminophen (TYLENOL) 500 MG tablet Take 500 mg by mouth every 6 (six) hours as needed for mild pain or moderate pain.    albuterol (VENTOLIN HFA) 108 (90 Base) MCG/ACT inhaler INHALE 2 PUFFS BY MOUTH EVERY 6 HOURS AS NEEDED FOR WHEEZING FOR SHORTNESS OF BREATH   aluminum sulfate-calcium acetate (DOMEBORO) packet Apply 1 packet topically 3 (three) times daily.   amLODipine (NORVASC) 5 MG tablet Take 1 tablet (5 mg total) by mouth daily.   atorvastatin (LIPITOR) 80 MG tablet Take 1 tablet (80 mg total) by mouth daily.   baclofen (LIORESAL) 10 MG tablet Take 1 tablet (10 mg total) by mouth 2 (two) times daily.   cetirizine (ZYRTEC) 10 MG tablet Take 10 mg by mouth daily at 6 (six) AM.   cholecalciferol (VITAMIN D) 1000 units tablet Take 1,000 Units by mouth daily.   fluticasone (FLONASE) 50 MCG/ACT nasal spray Use 2 spray(s) in each nostril once daily   gabapentin (NEURONTIN) 300 MG capsule Take 300-600 mg by mouth 4  (four) times daily as needed (neuropathic pain).    HYDROcodone-acetaminophen (NORCO) 5-325 MG tablet Take 1 tablet by mouth every 4 (four) hours as needed for moderate pain.   letrozole (FEMARA) 2.5 MG tablet Take 2.5 mg by mouth daily.   NARCAN 4 MG/0.1ML LIQD nasal spray kit Place 1 spray into the nose.    Oxycodone HCl 10 MG TABS LIMIT ONE HALF TO ONE TABLET BY MOUTH 3 TO 5 TIMES DAILY IF TOLERATED   PREVIDENT 5000 DRY MOUTH 1.1 % GEL dental gel Place onto teeth at bedtime.   pyridoxine (B-6) 100 MG tablet Take 100 mg by mouth daily.   sertraline (ZOLOFT) 100 MG tablet Take 2 tablets (200 mg total) by mouth daily.   traZODone (DESYREL) 50 MG tablet TAKE 1 TO 2 TABLETS BY MOUTH AT BEDTIME AS NEEDED FOR SLEEP   vitamin B-12 (CYANOCOBALAMIN) 500 MCG tablet Take 500 mcg by mouth daily.   furosemide (LASIX) 20 MG tablet Take 1 tablet (20 mg total) by mouth daily as needed for fluid. (Patient not taking: Reported on 07/22/2022)   Facility-Administered Medications Prior to Visit  Medication Dose Route Frequency Provider   heparin lock flush 100 unit/mL  500 Units Intravenous Once Jeralyn Ruths, MD   heparin lock flush 100 unit/mL  500 Units Intravenous Once Jeralyn Ruths, MD   sodium chloride flush (NS) 0.9 % injection 10 mL  10 mL Intravenous PRN Gerarda Fraction  J, MD   Review of Systems  Constitutional:  Negative for fever.  HENT:  Positive for congestion, postnasal drip and rhinorrhea. Negative for sore throat.   Respiratory:  Positive for cough, chest tightness, shortness of breath and wheezing.   Cardiovascular:  Negative for chest pain.  Gastrointestinal:  Positive for diarrhea. Negative for abdominal pain.     Objective    BP 130/87 (BP Location: Right Arm, Patient Position: Sitting, Cuff Size: Large)   Pulse 97   Temp 98.7 F (37.1 C) (Oral)   Resp 16   Wt 212 lb 1.6 oz (96.2 kg)   SpO2 100%   BMI 37.57 kg/m    Physical Exam Constitutional:      General: She  is awake.     Appearance: She is well-developed.  HENT:     Head: Normocephalic.  Eyes:     Conjunctiva/sclera: Conjunctivae normal.  Cardiovascular:     Rate and Rhythm: Normal rate and regular rhythm.     Heart sounds: Normal heart sounds.  Pulmonary:     Effort: Pulmonary effort is normal.     Breath sounds: Normal breath sounds. No stridor. No wheezing, rhonchi or rales.  Skin:    General: Skin is warm.  Neurological:     Mental Status: She is alert and oriented to person, place, and time.  Psychiatric:        Attention and Perception: Attention normal.        Mood and Affect: Mood normal.        Speech: Speech normal.        Behavior: Behavior is cooperative.     No results found for any visits on 07/22/22.  Assessment & Plan     Bacterial bronchitis Rx prednisone 20 mg x 5 days Rx amoxicillin 875 mg bid x 7 days  Advised increase fluids If any continued SOB, wheezing, fever please call office, would consider cxr.   2. Diarrhea Likely post viral/due to current bronchitis Increase fluids If no resolution after bronchitis treatment please call office for further recommendations  Return if symptoms worsen or fail to improve.     I, [name], PA-C have reviewed all documentation for this visit. The documentation on  07/22/22  for the exam, diagnosis, procedures, and orders are all accurate and complete.  [name], PA-C [facility] [address] [place], [place], [address] Office: [phone] Fax: [phone]   [facility]

## 2022-07-22 ENCOUNTER — Encounter: Payer: Self-pay | Admitting: Physician Assistant

## 2022-07-22 ENCOUNTER — Ambulatory Visit (INDEPENDENT_AMBULATORY_CARE_PROVIDER_SITE_OTHER): Payer: 59 | Admitting: Physician Assistant

## 2022-07-22 VITALS — BP 130/87 | HR 97 | Temp 98.7°F | Resp 16 | Wt 212.1 lb

## 2022-07-22 DIAGNOSIS — J208 Acute bronchitis due to other specified organisms: Secondary | ICD-10-CM

## 2022-07-22 DIAGNOSIS — B9689 Other specified bacterial agents as the cause of diseases classified elsewhere: Secondary | ICD-10-CM

## 2022-07-22 DIAGNOSIS — R197 Diarrhea, unspecified: Secondary | ICD-10-CM

## 2022-07-22 MED ORDER — PREDNISONE 20 MG PO TABS: 20.0000 mg | ORAL_TABLET | Freq: Every day | ORAL | 0 refills | Status: DC

## 2022-07-22 MED ORDER — AMOXICILLIN 875 MG PO TABS: 875.0000 mg | ORAL_TABLET | Freq: Two times a day (BID) | ORAL | 0 refills | Status: AC

## 2022-07-29 DIAGNOSIS — M1712 Unilateral primary osteoarthritis, left knee: Secondary | ICD-10-CM | POA: Diagnosis not present

## 2022-08-03 DIAGNOSIS — G8929 Other chronic pain: Secondary | ICD-10-CM | POA: Diagnosis not present

## 2022-08-03 DIAGNOSIS — M19011 Primary osteoarthritis, right shoulder: Secondary | ICD-10-CM | POA: Diagnosis not present

## 2022-08-03 DIAGNOSIS — M13862 Other specified arthritis, left knee: Secondary | ICD-10-CM | POA: Diagnosis not present

## 2022-08-10 ENCOUNTER — Other Ambulatory Visit: Payer: Self-pay | Admitting: Family Medicine

## 2022-08-19 ENCOUNTER — Other Ambulatory Visit: Payer: Self-pay | Admitting: Family Medicine

## 2022-08-19 DIAGNOSIS — I10 Essential (primary) hypertension: Secondary | ICD-10-CM

## 2022-08-23 ENCOUNTER — Encounter: Payer: Self-pay | Admitting: Oncology

## 2022-09-01 ENCOUNTER — Other Ambulatory Visit: Payer: Self-pay | Admitting: Physician Assistant

## 2022-09-01 DIAGNOSIS — J309 Allergic rhinitis, unspecified: Secondary | ICD-10-CM

## 2022-09-01 DIAGNOSIS — R21 Rash and other nonspecific skin eruption: Secondary | ICD-10-CM

## 2022-09-02 DIAGNOSIS — M1712 Unilateral primary osteoarthritis, left knee: Secondary | ICD-10-CM | POA: Diagnosis not present

## 2022-09-02 NOTE — Telephone Encounter (Signed)
Requested Prescriptions  Pending Prescriptions Disp Refills   fluticasone (FLONASE) 50 MCG/ACT nasal spray [Pharmacy Med Name: FLUTICASONE PROP 50 MCG SPRAY] 16 g 0    Sig: USE 2 SPRAY(S) IN EACH NOSTRIL ONCE DAILY     Ear, Nose, and Throat: Nasal Preparations - Corticosteroids Passed - 09/01/2022  3:01 AM      Passed - Valid encounter within last 12 months    Recent Outpatient Visits           1 month ago Acute bacterial bronchitis   [place] Mikey Kirschner, PA-C   4 months ago [place], [place], PA-C   5 months ago Edema, unspecified type   [facility] Birdie Sons, MD   5 months ago Primary insomnia   [place], Donald E, MD   10 months ago Acute gastroenteropathy due to Norovirus   [place], Kirstie Peri, MD

## 2022-09-04 ENCOUNTER — Ambulatory Visit: Payer: Self-pay

## 2022-09-04 ENCOUNTER — Ambulatory Visit (INDEPENDENT_AMBULATORY_CARE_PROVIDER_SITE_OTHER): Payer: 59 | Admitting: Family Medicine

## 2022-09-04 VITALS — BP 114/86 | HR 91 | Temp 98.6°F | Wt 205.0 lb

## 2022-09-04 DIAGNOSIS — M5137 Other intervertebral disc degeneration, lumbosacral region: Secondary | ICD-10-CM

## 2022-09-04 DIAGNOSIS — J0110 Acute frontal sinusitis, unspecified: Secondary | ICD-10-CM

## 2022-09-04 DIAGNOSIS — M51379 Other intervertebral disc degeneration, lumbosacral region without mention of lumbar back pain or lower extremity pain: Secondary | ICD-10-CM

## 2022-09-04 MED ORDER — HYDROCODONE-ACETAMINOPHEN 5-325 MG PO TABS: 1.0000 | ORAL_TABLET | ORAL | 0 refills | Status: DC | PRN

## 2022-09-04 MED ORDER — AMOXICILLIN 500 MG PO CAPS: 1000.0000 mg | ORAL_CAPSULE | Freq: Three times a day (TID) | ORAL | 0 refills | Status: AC

## 2022-09-04 MED ORDER — HYDROCODONE-ACETAMINOPHEN 7.5-325 MG PO TABS: 1.0000 | ORAL_TABLET | Freq: Four times a day (QID) | ORAL | 0 refills | Status: AC | PRN

## 2022-09-04 NOTE — Telephone Encounter (Signed)
  Chief Complaint: sinus congestion Symptoms: cough with congestion, excessive mucus where feeling choking at times, SOB at times, stuffy nose Frequency: started 1st of week  Pertinent Negatives: Patient denies fever Disposition: []$ ED /[]$ Urgent Care (no appt availability in office) / [x]$ Appointment(In office/virtual)/ []$  [facility]/ []$ Home Care/ []$ Refused Recommended Disposition /[]$ [facility]/ []$  Follow-up with PCP Additional Notes: pt wanting to be seen d/t having sx and having too much mucus where it feels like choking her or going to throw up. Has been using Flonase nasal spray but not seen any improvement. Scheduled OV today with PCP at 1600.   Reason for Disposition  [1] Sinus congestion (pressure, fullness) AND [2] present > 10 days  Answer Assessment - Initial Assessment Questions 2. ONSET: "When did the sinus pain start?"  (e.g., hours, days)      1st of week  3. SEVERITY: "How bad is the pain?"   (Scale 1-10; mild, moderate or severe)   - MILD (1-3): doesn't interfere with normal activities    - MODERATE (4-7): interferes with normal activities (e.g., work or school) or awakens from sleep   - SEVERE (8-10): excruciating pain and patient unable to do any normal activities        Mild to moderate  5. NASAL CONGESTION: "Is the nose blocked?" If Yes, ask: "Can you open it or must you breathe through your mouth?"     yes 7. FEVER: "Do you have a fever?" If Yes, ask: "What is it, how was it measured, and when did it start?"      no 8. OTHER SYMPTOMS: "Do you have any other symptoms?" (e.g., sore throat, cough, earache, difficulty breathing)     Excessive mucus, cough with congestion, SOB at times  Protocols used: Sinus Pain or Congestion-A-AH

## 2022-09-04 NOTE — Progress Notes (Signed)
Established patient visit   Patient: Carrie Alvarez   DOB: [birth year]   72 y.o. Female  MRN: [id number] Visit Date: 09/04/2022  Today's healthcare provider: Lelon Huh, MD    Subjective    HPI  Patient id a 72 year old female who presents for evaluation of sinus symptoms.  She states she began feeling bad at the week and thought she could fight it off.  She has tried using Copywriter, advertising cold last night and she said she got some rellief in that it helped her to not feel like she was choking.  Medications: Outpatient Medications Prior to Visit  Medication Sig   acetaminophen (TYLENOL) 500 MG tablet Take 500 mg by mouth every 6 (six) hours as needed for mild pain or moderate pain.    albuterol (VENTOLIN HFA) 108 (90 Base) MCG/ACT inhaler INHALE 2 PUFFS BY MOUTH EVERY 6 HOURS AS NEEDED FOR WHEEZING FOR SHORTNESS OF BREATH   amLODipine (NORVASC) 5 MG tablet Take 1 tablet (5 mg total) by mouth daily. Please schedule office visit before any future refill.   atorvastatin (LIPITOR) 80 MG tablet Take 1 tablet (80 mg total) by mouth daily. Please schedule office visit before any future refill.   baclofen (LIORESAL) 10 MG tablet Take 1 tablet (10 mg total) by mouth 2 (two) times daily.   cetirizine (ZYRTEC) 10 MG tablet Take 10 mg by mouth daily at 6 (six) AM.   cholecalciferol (VITAMIN D) 1000 units tablet Take 1,000 Units by mouth daily.   fluticasone (FLONASE) 50 MCG/ACT nasal spray USE 2 SPRAY(S) IN EACH NOSTRIL ONCE DAILY   furosemide (LASIX) 20 MG tablet Take 1 tablet (20 mg total) by mouth daily as needed for fluid.   gabapentin (NEURONTIN) 300 MG capsule Take 300-600 mg by mouth 4 (four) times daily as needed (neuropathic pain).    HYDROcodone-acetaminophen (NORCO) 5-325 MG tablet Take 1 tablet by mouth every 4 (four) hours as needed for moderate pain.   letrozole (FEMARA) 2.5 MG tablet Take 2.5 mg by mouth daily.   NARCAN 4 MG/0.1ML LIQD nasal spray kit Place 1 spray into  the nose.    Oxycodone HCl 10 MG TABS LIMIT ONE HALF TO ONE TABLET BY MOUTH 3 TO 5 TIMES DAILY IF TOLERATED   predniSONE (DELTASONE) 20 MG tablet Take 1 tablet (20 mg total) by mouth daily with breakfast.   PREVIDENT 5000 DRY MOUTH 1.1 % GEL dental gel Place onto teeth at bedtime.   pyridoxine (B-6) 100 MG tablet Take 100 mg by mouth daily.   sertraline (ZOLOFT) 100 MG tablet Take 2 tablets (200 mg total) by mouth daily.   traZODone (DESYREL) 50 MG tablet TAKE 1 TO 2 TABLETS BY MOUTH AT BEDTIME AS NEEDED FOR SLEEP   vitamin B-12 (CYANOCOBALAMIN) 500 MCG tablet Take 500 mcg by mouth daily.   [DISCONTINUED] aluminum sulfate-calcium acetate (DOMEBORO) packet Apply 1 packet topically 3 (three) times daily.   Facility-Administered Medications Prior to Visit  Medication Dose Route Frequency Provider   heparin lock flush 100 unit/mL  500 Units Intravenous Once [name], MD   heparin lock flush 100 unit/mL  500 Units Intravenous Once [name], MD   sodium chloride flush (NS) 0.9 % injection 10 mL  10 mL Intravenous PRN [name], MD    Review of Systems  Constitutional:  Positive for fatigue. Negative for chills, diaphoresis and fever.  HENT:  Positive for congestion, postnasal drip, rhinorrhea, sinus pressure,  sinus pain and sneezing. Negative for ear discharge, ear pain, hearing loss, sore throat, tinnitus, trouble swallowing and voice change.   Eyes:  Negative for pain.  Respiratory:  Positive for cough and shortness of breath. Negative for wheezing.   Cardiovascular:  Negative for chest pain.  Gastrointestinal:  Negative for abdominal pain.  Musculoskeletal:  Positive for myalgias.       Objective    BP 114/86 (BP Location: Right Arm, Patient Position: Sitting, Cuff Size: Normal)   Pulse 91   Temp 98.6 F (37 C) (Oral)   Wt 205 lb (93 kg)   SpO2 98%   BMI 36.31 kg/m    Physical Exam   General Appearance:    Mildly obese female, alert, cooperative,  in no acute distress  HENT:   bilateral TM normal without fluid or infection, neck without nodes, throat normal without erythema or exudate, pharynx erythematous without exudate, frontal sinuses tender, and post nasal drip noted  Eyes:    PERRL, conjunctiva/corneas clear, EOM's intact       Lungs:     Clear to auscultation bilaterally, respirations unlabored  Heart:    Normal heart rate. Normal rhythm. No murmurs, rubs, or gallops.    Neurologic:   Awake, alert, oriented x 3. No apparent focal neurological           defect.        Assessment & Plan     1. Acute non-recurrent frontal sinusitis  - amoxicillin (AMOXIL) 500 MG capsule; Take 2 capsules (1,000 mg total) by mouth 3 (three) times daily for 7 days.  Dispense: 42 capsule; Refill: 0  2. DDD (degenerative disc disease), lumbosacral Is usually prescribed oxycodone by pain clinic but states her pharmacy does not have it stock and she is trying to get in touch with her pain management clinic.  - HYDROcodone-acetaminophen ([place]) 7.5-325 MG tablet; Take 1 tablet by mouth every 4 (four) hours as needed for moderate pain.  Dispense: 200 tablet; Refill: 0   (Contact pharmacy to correct quantity on original 5/325 hydrocodone/apap and advised they only have 1 tablet in stock. 5/325 prescription cancelled and changed to 7.5/325 as above)     The entirety of the information documented in the History of Present Illness, Review of Systems and Physical Exam were personally obtained by me. Portions of this information were initially documented by the CMA and reviewed by me for thoroughness and accuracy.     Lelon Huh, MD  [place] [phone] (phone) [phone] (fax)  [place]

## 2022-09-09 DIAGNOSIS — M13862 Other specified arthritis, left knee: Secondary | ICD-10-CM | POA: Diagnosis not present

## 2022-09-09 DIAGNOSIS — G8929 Other chronic pain: Secondary | ICD-10-CM | POA: Diagnosis not present

## 2022-09-09 DIAGNOSIS — Z79891 Long term (current) use of opiate analgesic: Secondary | ICD-10-CM | POA: Diagnosis not present

## 2022-09-09 DIAGNOSIS — G5600 Carpal tunnel syndrome, unspecified upper limb: Secondary | ICD-10-CM | POA: Diagnosis not present

## 2022-09-09 DIAGNOSIS — Z5181 Encounter for therapeutic drug level monitoring: Secondary | ICD-10-CM | POA: Diagnosis not present

## 2022-09-22 ENCOUNTER — Ambulatory Visit: Admission: RE | Admit: 2022-09-22 | Payer: 59 | Source: Ambulatory Visit

## 2022-09-24 ENCOUNTER — Inpatient Hospital Stay: Payer: 59

## 2022-09-24 ENCOUNTER — Inpatient Hospital Stay: Payer: 59 | Admitting: Oncology

## 2022-09-26 ENCOUNTER — Other Ambulatory Visit: Payer: Self-pay | Admitting: Physician Assistant

## 2022-09-26 DIAGNOSIS — J309 Allergic rhinitis, unspecified: Secondary | ICD-10-CM

## 2022-09-26 DIAGNOSIS — R21 Rash and other nonspecific skin eruption: Secondary | ICD-10-CM

## 2022-09-28 NOTE — Telephone Encounter (Signed)
Requested medication (s) are due for refill today: Yes  Requested medication (s) are on the active medication list: Yes  Last refill:  09/02/22  Future visit scheduled: Yes  Notes to clinic:  Pharmacy requests 90 day supply and diagnosis code.    Requested Prescriptions  Pending Prescriptions Disp Refills   fluticasone (FLONASE) 50 MCG/ACT nasal spray [Pharmacy Med Name: FLUTICASONE PROP 50 MCG SPRAY] 48 mL 1    Sig: USE 2 SPRAY(S) IN EACH NOSTRIL ONCE DAILY     Ear, Nose, and Throat: Nasal Preparations - Corticosteroids Passed - 09/26/2022  1:33 PM      Passed - Valid encounter within last 12 months    Recent Outpatient Visits           3 weeks ago Acute non-recurrent frontal sinusitis   [place], MD   2 months ago Acute bacterial bronchitis   [place] Mikey Kirschner, PA-C   5 months ago [place], [place], PA-C   6 months ago Edema, unspecified type   [facility] Birdie Sons, MD   6 months ago Primary insomnia   [place], Donald E, MD

## 2022-09-29 ENCOUNTER — Other Ambulatory Visit: Payer: Self-pay

## 2022-09-29 DIAGNOSIS — C184 Malignant neoplasm of transverse colon: Secondary | ICD-10-CM

## 2022-10-06 DIAGNOSIS — M25562 Pain in left knee: Secondary | ICD-10-CM | POA: Diagnosis not present

## 2022-10-06 DIAGNOSIS — M47816 Spondylosis without myelopathy or radiculopathy, lumbar region: Secondary | ICD-10-CM | POA: Diagnosis not present

## 2022-10-06 DIAGNOSIS — G8929 Other chronic pain: Secondary | ICD-10-CM | POA: Diagnosis not present

## 2022-10-06 DIAGNOSIS — G5600 Carpal tunnel syndrome, unspecified upper limb: Secondary | ICD-10-CM | POA: Diagnosis not present

## 2022-10-06 DIAGNOSIS — M19011 Primary osteoarthritis, right shoulder: Secondary | ICD-10-CM | POA: Diagnosis not present

## 2022-10-06 DIAGNOSIS — M25561 Pain in right knee: Secondary | ICD-10-CM | POA: Diagnosis not present

## 2022-10-06 DIAGNOSIS — M170 Bilateral primary osteoarthritis of knee: Secondary | ICD-10-CM | POA: Diagnosis not present

## 2022-10-06 DIAGNOSIS — M62830 Muscle spasm of back: Secondary | ICD-10-CM | POA: Diagnosis not present

## 2022-10-08 ENCOUNTER — Ambulatory Visit
Admission: RE | Admit: 2022-10-08 | Discharge: 2022-10-08 | Disposition: A | Discharge status: Home / Self Care | Payer: 59 | Source: Ambulatory Visit | Attending: Oncology | Admitting: Oncology

## 2022-10-08 DIAGNOSIS — K760 Fatty (change of) liver, not elsewhere classified: Secondary | ICD-10-CM | POA: Diagnosis not present

## 2022-10-08 DIAGNOSIS — J8410 Pulmonary fibrosis, unspecified: Secondary | ICD-10-CM | POA: Diagnosis not present

## 2022-10-08 DIAGNOSIS — Z006 Encounter for examination for normal comparison and control in clinical research program: Secondary | ICD-10-CM | POA: Insufficient documentation

## 2022-10-08 DIAGNOSIS — C189 Malignant neoplasm of colon, unspecified: Secondary | ICD-10-CM | POA: Diagnosis not present

## 2022-10-08 DIAGNOSIS — C184 Malignant neoplasm of transverse colon: Secondary | ICD-10-CM

## 2022-10-08 DIAGNOSIS — R918 Other nonspecific abnormal finding of lung field: Secondary | ICD-10-CM | POA: Diagnosis not present

## 2022-10-08 MED ORDER — IOHEXOL 300 MG/ML  SOLN
100.0000 mL | Freq: Once | INTRAMUSCULAR | Status: AC | PRN
  Administered 2022-10-08: 100 mL via INTRAVENOUS

## 2022-10-12 ENCOUNTER — Inpatient Hospital Stay: Payer: 59 | Attending: Oncology | Admitting: Oncology

## 2022-10-12 ENCOUNTER — Inpatient Hospital Stay: Payer: 59

## 2022-10-12 ENCOUNTER — Encounter: Payer: Self-pay | Admitting: Oncology

## 2022-10-12 VITALS — BP 97/71 | HR 72 | Temp 96.2°F | Resp 18 | Ht 63.0 in | Wt 205.0 lb

## 2022-10-12 DIAGNOSIS — G629 Polyneuropathy, unspecified: Secondary | ICD-10-CM | POA: Insufficient documentation

## 2022-10-12 DIAGNOSIS — Z1509 Genetic susceptibility to other malignant neoplasm: Secondary | ICD-10-CM | POA: Diagnosis not present

## 2022-10-12 DIAGNOSIS — Z9221 Personal history of antineoplastic chemotherapy: Secondary | ICD-10-CM | POA: Diagnosis not present

## 2022-10-12 DIAGNOSIS — C184 Malignant neoplasm of transverse colon: Secondary | ICD-10-CM

## 2022-10-12 DIAGNOSIS — Z006 Encounter for examination for normal comparison and control in clinical research program: Secondary | ICD-10-CM | POA: Insufficient documentation

## 2022-10-12 DIAGNOSIS — Z800 Family history of malignant neoplasm of digestive organs: Secondary | ICD-10-CM | POA: Insufficient documentation

## 2022-10-12 DIAGNOSIS — M5430 Sciatica, unspecified side: Secondary | ICD-10-CM | POA: Diagnosis not present

## 2022-10-12 DIAGNOSIS — Z8542 Personal history of malignant neoplasm of other parts of uterus: Secondary | ICD-10-CM | POA: Insufficient documentation

## 2022-10-12 DIAGNOSIS — Z853 Personal history of malignant neoplasm of breast: Secondary | ICD-10-CM | POA: Insufficient documentation

## 2022-10-12 DIAGNOSIS — M549 Dorsalgia, unspecified: Secondary | ICD-10-CM | POA: Insufficient documentation

## 2022-10-12 DIAGNOSIS — Z85038 Personal history of other malignant neoplasm of large intestine: Secondary | ICD-10-CM | POA: Diagnosis not present

## 2022-10-12 DIAGNOSIS — R609 Edema, unspecified: Secondary | ICD-10-CM | POA: Diagnosis not present

## 2022-10-12 NOTE — Progress Notes (Signed)
[place]  [phone] [phone]  ID: Carrie Alvarez OB: 09/25/1950  MR#: [id number]  [id number]  Patient Care Team: Birdie Sons, MD as PCP - General (Family Medicine) [name], MD as Consulting Physician (Oncology) [name], MD as Attending Physician (Pain Medicine) [name]., MD as Consulting Physician (Ophthalmology) [name], RN as Oncology Nurse Navigator [name], [name], MD as Referring Physician (Anesthesiology)  CHIEF COMPLAINT: Stage IIa ER+ left breast cancer with no breast lesion and axillary lymph node metastasis.  Now with stage IIIa colon cancer.  INTERVAL HISTORY: Patient returns to clinic today for routine 75-month evaluation and discussion of her imaging results.  She currently feels well and is asymptomatic.  Her peripheral neuropathy is chronic and unchanged.  She does not complain of back pain today.  She has no other neurologic complaints.  She denies any fevers.  She has no chest pain, shortness of breath, cough, or hemoptysis.  She denies any nausea, vomiting, constipation, or diarrhea.  She has no urinary complaints.  Patient offers no further specific complaints today.  REVIEW OF SYSTEMS:   Review of Systems  Constitutional: Negative.  Negative for fever, malaise/fatigue and weight loss.  Respiratory: Negative.  Negative for cough and shortness of breath.   Cardiovascular: Negative.  Negative for chest pain and leg swelling.  Gastrointestinal: Negative.  Negative for abdominal pain, blood in stool, constipation, diarrhea, melena, nausea and vomiting.  Genitourinary: Negative.  Negative for dysuria.  Musculoskeletal: Negative.  Negative for back pain, falls and neck pain.  Skin: Negative.  Negative for rash.  Neurological:  Positive for tingling and sensory change. Negative for dizziness, focal weakness, weakness and headaches.  Psychiatric/Behavioral: Negative.   Negative for depression. The patient is not nervous/anxious.    As per HPI. Otherwise, a complete review of systems is negative.  PAST MEDICAL HISTORY: Past Medical History:  Diagnosis Date   Allergy    Anemia    Arthritis    Back pain    Breast cancer 2015   LT LUMPECTOMY 12-2014 FOLLOWING CHEMO   Carpal tunnel syndrome    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   Cataract    Chronic pain    Colon cancer 01/2019   Colon cancer 2020   Depression    Dyspnea    Family history of breast cancer    Family history of colon cancer    GERD (gastroesophageal reflux disease)    problem with reflux during chemo   History of methicillin resistant staphylococcus aureus (MRSA)    Hypercholesteremia    Hypertension    Lumbosacral pain    sees pain management in [place]   Obesity    Personal history of chemotherapy 2016   left breast ca   Personal history of chemotherapy 2020   colon ca   Pinched nerve    left side, lumbar area   Uterine cancer 1994    PAST SURGICAL HISTORY: Past Surgical History:  Procedure Laterality Date   ABDOMINAL HYSTERECTOMY  1994   UTERINE CA   AXILLARY LYMPH NODE BIOPSY Left 12/18/2014   Procedure: AXILLARY LYMPH NODE BIOPSY/;  Surgeon: Robert Bellow, MD;  Location: [facility];  Service: General;  Laterality: Left;   AXILLARY LYMPH NODE DISSECTION Left 12/18/2014   Procedure: AXILLARY LYMPH NODE DISSECTION;  Surgeon: Robert Bellow, MD;  Location: [facility];  Service: General;  Laterality: Left;   BREAST BIOPSY Left 05/2014   CORE BX OF  LN, METASTATIC ADENOCARCINOMA   BREAST LUMPECTOMY Left 12/2014   CHEMO FIRST THEN SURGERY OF LN   BREAST SURGERY     lymph node removal   CATARACT EXTRACTION W/PHACO Left 11/26/2020   Procedure: CATARACT EXTRACTION PHACO AND INTRAOCULAR LENS PLACEMENT ([place]) LEFT;  Surgeon: Birder Robson, MD;  Location: [place];  Service: Ophthalmology;  Laterality: Left;  6.40 00:43.9   CATARACT EXTRACTION W/PHACO  Right 12/10/2020   Procedure: CATARACT EXTRACTION PHACO AND INTRAOCULAR LENS PLACEMENT (I[facility]) RIGHT;  Surgeon: Birder Robson, MD;  Location: [place];  Service: Ophthalmology;  Laterality: Right;  7.59 0:47.4   CHOLECYSTECTOMY N/A 04/19/2016   Procedure: LAPAROSCOPIC CHOLECYSTECTOMY WITH INTRAOPERATIVE CHOLANGIOGRAM;  Surgeon: Hubbard Robinson, MD;  Location: [facility];  Service: General;  Laterality: N/A;   COLON RESECTION Right 01/10/2019   Procedure: HAND ASSISTED LAPAROSCOPIC RIGHT COLON RESECTION;  Surgeon: Jules Husbands, MD;  Location: [facility];  Service: General;  Laterality: Right;   COLONOSCOPY WITH PROPOFOL N/A 12/30/2018   Procedure: COLONOSCOPY WITH PROPOFOL;  Surgeon: Lucilla Lame, MD;  Location: [place];  Service: Endoscopy;  Laterality: N/A;   COLONOSCOPY WITH PROPOFOL N/A 03/05/2020   Procedure: COLONOSCOPY WITH PROPOFOL;  Surgeon: Lucilla Lame, MD;  [facility];  Service: Endoscopy;  Laterality: N/A;   COLONOSCOPY WITH PROPOFOL N/A 05/12/2022   Procedure: COLONOSCOPY WITH PROPOFOL;  Surgeon: Lucilla Lame, MD;  Location: [facility];  Service: Endoscopy;  Laterality: N/A;   medial branch block  11/06/2015   lumbar facet Dr. Primus Bravo   POLYPECTOMY N/A 12/30/2018   Procedure: POLYPECTOMY;  Surgeon: Lucilla Lame, MD;  Location: [place];  Service: Endoscopy;  Laterality: N/A;  Clips placed at Hepatic Flexure Polyp (2) and Transverse Colon Polyp (4) removal sites   PORTACATH PLACEMENT Right 02/02/2019   Procedure: INSERTION PORT-A-CATH;  Surgeon: Jules Husbands, MD;  Location: [facility];  Service: General;  Laterality: Right;   RADIOFREQUENCY ABLATION     lumbar   SENTINEL NODE BIOPSY Left 12/18/2014   Procedure: SENTINEL NODE BIOPSY;  Surgeon: Robert Bellow, MD;  Location: [facility];  Service: General;  Laterality: Left;    FAMILY HISTORY Family History  Problem Relation Age of Onset   Breast cancer Sister 24   Colon cancer Sister     Colon cancer Mother        dx 32s   Hypertension Mother    Asthma Mother    Cancer Father        voice box removed   Cancer Maternal Grandmother        unsure type   Colon cancer Cousin    Cancer Cousin        unsure type       ADVANCED DIRECTIVES:    HEALTH MAINTENANCE: Social History   Tobacco Use   Smoking status: Former    Packs/day: .5    Types: Cigarettes    Quit date: 07/18/2014    Years since quitting: 8.2   Smokeless tobacco: Never  Vaping Use   Vaping Use: Never used  Substance Use Topics   Alcohol use: No    Alcohol/week: 0.0 standard drinks of alcohol   Drug use: No    No Known Allergies  Current Outpatient Medications  Medication Sig Dispense Refill   acetaminophen (TYLENOL) 500 MG tablet Take 500 mg by mouth every 6 (six) hours as needed for mild pain or moderate pain.      albuterol (VENTOLIN HFA) 108 (90 Base) MCG/ACT  inhaler INHALE 2 PUFFS BY MOUTH EVERY 6 HOURS AS NEEDED FOR WHEEZING FOR SHORTNESS OF BREATH 9 g 0   amLODipine (NORVASC) 5 MG tablet Take 1 tablet (5 mg total) by mouth daily. Please schedule office visit before any future refill. 90 tablet 0   atorvastatin (LIPITOR) 80 MG tablet Take 1 tablet (80 mg total) by mouth daily. Please schedule office visit before any future refill. 90 tablet 0   baclofen (LIORESAL) 10 MG tablet Take 1 tablet (10 mg total) by mouth 2 (two) times daily. 30 each 0   cetirizine (ZYRTEC) 10 MG tablet Take 10 mg by mouth daily at 6 (six) AM.     cholecalciferol (VITAMIN D) 1000 units tablet Take 1,000 Units by mouth daily.     fluticasone (FLONASE) 50 MCG/ACT nasal spray USE 2 SPRAY(S) IN EACH NOSTRIL ONCE DAILY 48 mL 1   furosemide (LASIX) 20 MG tablet Take 1 tablet (20 mg total) by mouth daily as needed for fluid. 20 tablet 1   gabapentin (NEURONTIN) 300 MG capsule Take 300-600 mg by mouth 4 (four) times daily as needed (neuropathic pain).      letrozole (FEMARA) 2.5 MG tablet Take 2.5 mg by mouth daily.      NARCAN 4 MG/0.1ML LIQD nasal spray kit Place 1 spray into the nose.      Oxycodone HCl 10 MG TABS LIMIT ONE HALF TO ONE TABLET BY MOUTH 3 TO 5 TIMES DAILY IF TOLERATED     predniSONE (DELTASONE) 20 MG tablet Take 1 tablet (20 mg total) by mouth daily with breakfast. 5 tablet 0   PREVIDENT 5000 DRY MOUTH 1.1 % GEL dental gel Place onto teeth at bedtime.     pyridoxine (B-6) 100 MG tablet Take 100 mg by mouth daily.     sertraline (ZOLOFT) 100 MG tablet Take 2 tablets (200 mg total) by mouth daily. 180 tablet 4   traZODone (DESYREL) 50 MG tablet TAKE 1 TO 2 TABLETS BY MOUTH AT BEDTIME AS NEEDED FOR SLEEP 180 tablet 2   vitamin B-12 (CYANOCOBALAMIN) 500 MCG tablet Take 500 mcg by mouth daily.     No current facility-administered medications for this visit.   Facility-Administered Medications Ordered in Other Visits  Medication Dose Route Frequency Provider Last Rate Last Admin   heparin lock flush 100 unit/mL  500 Units Intravenous Once [name], MD       heparin lock flush 100 unit/mL  500 Units Intravenous Once [name], MD       sodium chloride flush (NS) 0.9 % injection 10 mL  10 mL Intravenous PRN [name], MD   10 mL at 02/27/19 0925    OBJECTIVE: Vitals:   10/12/22 1111  BP: 97/71  Pulse: 72  Resp: 18  Temp: (!) 96.2 F (35.7 C)  SpO2: 100%      Body mass index is 36.31 kg/m.    ECOG FS:1 - Symptomatic but completely ambulatory  General: Well-developed, well-nourished, no acute distress. Eyes: Pink conjunctiva, anicteric sclera. HEENT: Normocephalic, moist mucous membranes. Lungs: No audible wheezing or coughing. Heart: Regular rate and rhythm. Abdomen: Soft, nontender, no obvious distention. Musculoskeletal: No edema, cyanosis, or clubbing. Neuro: Alert, answering all questions appropriately. Cranial nerves grossly intact. Skin: No rashes or petechiae noted. Psych: Normal affect.  LAB RESULTS:  Lab Results  Component Value Date   NA  141 04/06/2022   K 3.6 04/06/2022   CL 107 04/06/2022   CO2 30 04/06/2022  GLUCOSE 94 04/06/2022   BUN 13 04/06/2022   CREATININE 1.12 (H) 04/06/2022   CALCIUM 9.4 04/06/2022   PROT 7.2 04/06/2022   ALBUMIN 3.7 04/06/2022   AST 22 04/06/2022   ALT 18 04/06/2022   ALKPHOS 68 04/06/2022   BILITOT 0.8 04/06/2022   GFRNONAA 53 (L) 04/06/2022   GFRAA 57 (L) 02/26/2020    Lab Results  Component Value Date   WBC 6.2 04/06/2022   NEUTROABS 3.9 04/06/2022   HGB 11.5 (L) 04/06/2022   HCT 33.7 (L) 04/06/2022   MCV 81.6 04/06/2022   PLT 205 04/06/2022     STUDIES: CT CHEST ABDOMEN PELVIS W CONTRAST  Result Date: 10/09/2022 CLINICAL DATA:  Colon cancer.  Restaging.  * Tracking Code: BO * EXAM: CT CHEST, ABDOMEN, AND PELVIS WITH CONTRAST TECHNIQUE: Multidetector CT imaging of the chest, abdomen and pelvis was performed following the standard protocol during bolus administration of intravenous contrast. RADIATION DOSE REDUCTION: This exam was performed according to the departmental dose-optimization program which includes automated exposure control, adjustment of the mA and/or kV according to patient size and/or use of iterative reconstruction technique. CONTRAST:  142mL OMNIPAQUE IOHEXOL 300 MG/ML  SOLN COMPARISON:  04/07/22 CT.  And older FINDINGS: CT CHEST FINDINGS Cardiovascular: Heart is nonenlarged. No pericardial effusion. Once again there is ectasia of the ascending aorta today measuring 4.2 cm in diameter previously 4.4 cm. Not significantly changed. Minimal atherosclerotic disease. Tortuous course of the descending thoracic aorta. Mediastinum/Nodes: Specific abnormal lymph node enlargement identified in the axillary region, hilum or mediastinum. Patulous thoracic esophagus. Preserved thyroid gland. Lungs/Pleura: There is some linear opacity lung bases likely scar or atelectasis. No consolidation, pneumothorax or effusion. Few air cysts are identified. Calcified areas left upper lobe  anteriorly on series 3, image 41. Possibly related to old granulomatous disease. The 2 mm lingular nodule is stable today on series 3, image 84. This has been stable for over 2 years. Specific imaging follow-up. No new dominant lung nodule. Musculoskeletal: Scattered degenerative changes along the spine. CT ABDOMEN PELVIS FINDINGS Hepatobiliary: Fatty liver infiltration identified. Previous cholecystectomy. No space-occupying liver lesion. Pancreas: Unremarkable. No pancreatic ductal dilatation or surrounding inflammatory changes. Spleen: Normal in size without focal abnormality. Adrenals/Urinary Tract: The adrenal glands are preserved. No enhancing renal mass, collecting system dilatation. The ureters have normal course and caliber down to the bladder. Preserved contours of the urinary bladder. Stomach/Bowel: Surgical changes from right hemicolectomy. Primary anastomosis. No obstruction, free air or free fluid. No abnormal soft tissue in the surgical anastomosis. Stomach and small bowel are nondilated. Vascular/Lymphatic: Aortic atherosclerosis. No enlarged abdominal or pelvic lymph nodes. Reproductive: Status post hysterectomy. No adnexal masses. Other: No ascites. No free air. Small fat containing umbilical hernia. Musculoskeletal: Scattered degenerative changes. Trace anterolisthesis of L4 on L5. IMPRESSION: Stable surgical changes of right hemicolectomy. No developing mass lesion, fluid collection or lymph node enlargement. Fatty liver infiltration. Stable mild enlargement of the ascending aorta 4.2 cm. Recommend annual imaging followup by CTA or MRA. This recommendation follows 2010 ACCF/AHA/AATS/ACR/ASA/SCA/SCAI/SIR/STS/SVM Guidelines for the Diagnosis and Management of Patients with Thoracic Aortic Disease. Circulation. 2010; [id number]. Aortic aneurysm NOS (ICD10-I71.9) Electronically Signed   By: Jill Side M.D.   On: 10/09/2022 12:10     ASSESSMENT: Stage IIa ER+ left breast cancer with no breast  lesion and axillary lymph node metastasis. (TxN1M0)  HER-2 negative.  Now with stage IIIa colon cancer.  PLAN:   Stage IIIa colon cancer: Pathology report reviewed independently.  Previously,  patient agreed to enroll in clinical trial.  Oxaliplatin was discontinued early secondary to worsening neuropathy.  Patient completed 12 cycles of chemotherapy on July 31, 2019 and then completed maintenance Tecentriq on January 29, 2020.  Her most recent imaging on October 08, 2022 reviewed independently and report as above with no obvious evidence of recurrent or progressive disease.  CEA continues to be within normal limits.  Her most recent colonoscopy on May 12, 2022 only revealed 2 polyps.  Recommendation was repeat in 5 years, but given her underlying Lynch syndrome this likely will need to be repeated sooner.  Per research protocol, patient will continue to have CT scan and CEA every 6 months for a total of 8 years completing in approximately July 2029.  Return to clinic in 6 months with repeat imaging and further evaluation.      Stage IIa ER+ left breast cancer with no breast lesion and axillary lymph node metastasis: Despite no obvious breast lesion on mammogram or breast MRI, pathology and pattern of spread was consistent with primary breast cancer. CT and bone scan at time of diagnosis revealed no other evidence of malignancy.  She only received 3 cycles of Adriamycin and Cytoxan. Taxol was discontinued early as well secondary to persistent peripheral neuropathy. Patient completed her chemotherapy on Nov 19, 2014.  She elected not to pursue axillary node dissection given the potential morbidity of this procedure. It was also elected not to pursue adjuvant XRT given there was no primary breast lesion.  Patient completed approximately 4 years of treatment with letrozole, but this was discontinued secondary to enrolling in clinical trial for her newly diagnosed colon cancer.  Her most recent mammogram in January  2024 was reported as BI-RADS 1.  Repeat in January 2025. Bone health: Patient's most recent bone mineral density on February 03, 2018 reported T score of -0.7 which is unchanged from 3 years prior.  Continue monitoring and evaluation per primary care. Lynch syndrome: Genetic testing confirmed patient has Lynch syndrome.  Likely maternal patient reports her mother had colon cancer at a very young age.  She reports her sister and son both have tested positive.  She reports her other children are not interested in genetic testing.  Appreciate genetics input.  Continue yearly colonoscopies as above. Endometrial cancer: Patient has had a total hysterectomy. Back pain/sciatica: Patient does not complain of this today.  Patient previously reported her insurance will only allow 3 injections every 6 months.   Peripheral neuropathy: Chronic and unchanged.  Continue gabapentin 600 mg 4 times per day.  Continue follow-up with pain clinic as scheduled.  She previously declined referral to neuro oncology. Peripheral edema: Patient is complain of this today.  Continue Lasix as per primary care instructions.    Patient expressed understanding and was in agreement with this plan. She also understands that She can call clinic at any time with any questions, concerns, or complaints.   Breast cancer metastasized to axillary lymph node   Staging form: Breast, AJCC 7th Edition     Clinical stage from 10/22/2014: Stage Unknown (TX, N1, M0) - Signed by [name], MD on 10/22/2014   [name], MD   10/12/2022 12:23 PM

## 2022-10-12 NOTE — Research (Signed)
"  Randomized Trial of Standard Chemotherapy Alone or Combined with Atezolizumab as Adjuvant Therapy for Stage III Colon Cancer and Deficient DNA Mismatch Repair"   [name] 63 Month Follow Up:  Patient in to the cancer center unaccompanied for her 42 month follow up visit for [id number] "[name]" protocol. Dr. Grayland Ormond examined patient today and reviewed her mammogram and CT scan results today. Patient informed by Dr. Grayland Ormond that everything looks really good from an oncology point of view currently. Her CT scan was negative for any recurrence, and her colonoscopy only found 2 polyps. Dr. Grayland Ormond did say she needed to follow up more often than every 5 years for colonoscopies due to her diagnosis of Lynch Syndrome. Patients only complaint today is continued fatigue, states she wants to sleep all the time. Medications were reviewed with the patient. Dr. Grayland Ormond encouraged her to cut down from two of her sleeping medications to just one. He suggests she try that and see if that helps any at all, if not he has advised her to speak with her primary care provider. Dr. Grayland Ormond informed her she may need a sleep study to find out why she's not getting enough rest at night. The research schedule was reviewed, patient was informed her follow up for the protocol will continue until 8 years. She will be scheduled for every 6 month follow ups until that time, or if the study were to close earlier. Patient was in agreement with all plans moving forward for research today.    Jeral Fruit, RN 10/12/22 11:48 AM

## 2022-10-13 LAB — CEA: CEA: 3.9 ng/mL (ref 0.0–4.7)

## 2022-10-17 ENCOUNTER — Other Ambulatory Visit: Payer: Self-pay | Admitting: Family Medicine

## 2022-10-17 DIAGNOSIS — J40 Bronchitis, not specified as acute or chronic: Secondary | ICD-10-CM

## 2022-10-19 ENCOUNTER — Telehealth: Payer: Self-pay | Admitting: Family Medicine

## 2022-10-19 NOTE — Telephone Encounter (Signed)
Called patient to schedule Medicare Annual Wellness Visit (AWV). No voicemail available to leave a message.  Last date of AWV: 10/09/2021  Please schedule an appointment at any time with [facility] .  If any questions, please contact me at [phone].  Thank you ,  Randon Goldsmith Care Guide [facility] AWV TEAM Direct Dial: [phone]

## 2022-11-09 ENCOUNTER — Other Ambulatory Visit: Payer: Self-pay | Admitting: Family Medicine

## 2022-11-09 DIAGNOSIS — F32A Depression, unspecified: Secondary | ICD-10-CM

## 2022-11-09 DIAGNOSIS — I10 Essential (primary) hypertension: Secondary | ICD-10-CM

## 2022-11-10 DIAGNOSIS — Z79891 Long term (current) use of opiate analgesic: Secondary | ICD-10-CM | POA: Diagnosis not present

## 2022-11-10 DIAGNOSIS — G894 Chronic pain syndrome: Secondary | ICD-10-CM | POA: Diagnosis not present

## 2022-11-30 ENCOUNTER — Telehealth: Payer: Self-pay

## 2022-11-30 NOTE — Telephone Encounter (Signed)
Copied from CRM [id number]. Topic: General - Other >> Nov 30, 2022  1:14 PM Ja-Kwan M wrote: Reason for CRM: Pt called along with a life insurance agent requesting a return call to discuss pt diagnosis of bronchitis. Cb# [phone]

## 2022-12-08 ENCOUNTER — Ambulatory Visit (INDEPENDENT_AMBULATORY_CARE_PROVIDER_SITE_OTHER): Payer: 59 | Admitting: Physician Assistant

## 2022-12-08 ENCOUNTER — Ambulatory Visit: Payer: Self-pay | Admitting: *Deleted

## 2022-12-08 ENCOUNTER — Encounter: Payer: Self-pay | Admitting: Physician Assistant

## 2022-12-08 ENCOUNTER — Ambulatory Visit: Payer: 59 | Admitting: Physician Assistant

## 2022-12-08 VITALS — BP 127/98 | HR 88 | Temp 98.6°F | Ht 63.0 in | Wt 199.0 lb

## 2022-12-08 DIAGNOSIS — R051 Acute cough: Secondary | ICD-10-CM

## 2022-12-08 DIAGNOSIS — J208 Acute bronchitis due to other specified organisms: Secondary | ICD-10-CM

## 2022-12-08 DIAGNOSIS — B9689 Other specified bacterial agents as the cause of diseases classified elsewhere: Secondary | ICD-10-CM

## 2022-12-08 LAB — POC COVID19 BINAXNOW: SARS Coronavirus 2 Ag: NEGATIVE

## 2022-12-08 MED ORDER — AMOXICILLIN 875 MG PO TABS: 875.0000 mg | ORAL_TABLET | Freq: Two times a day (BID) | ORAL | 0 refills | Status: AC

## 2022-12-08 MED ORDER — BENZONATATE 100 MG PO CAPS: 100.0000 mg | ORAL_CAPSULE | Freq: Two times a day (BID) | ORAL | 0 refills | Status: DC | PRN

## 2022-12-08 NOTE — Telephone Encounter (Signed)
Message from Valora Piccolo sent at 12/08/2022  8:35 AM EDT  Summary: Diarrhea, Congestion, Cough Advice 3-4 days   Pt is calling to report diarrhea, with cold chills, cough, congestion for 3-4 days. No appts at [facility]. Patient is willing to be seen at [facility]. Please advise          Call History   Type Contact Phone/Fax User  12/08/2022 08:34 AM EDT Phone (Incoming) Justice Alvarez, Carrie B "Keller" (Self) [phone] Judie Petit) Jens Som A   Reason for Disposition  [1] MODERATE diarrhea (e.g., 4-6 times / day more than normal) AND [2] age > 70 years  Answer Assessment - Initial Assessment Questions 1. DIARRHEA SEVERITY: "How bad is the diarrhea?" "How many more stools have you had in the past 24 hours than normal?"    - NO DIARRHEA (SCALE 0)   - MILD (SCALE 1-3): Few loose or mushy BMs; increase of 1-3 stools over normal daily number of stools; mild increase in ostomy output.   -  MODERATE (SCALE 4-7): Increase of 4-6 stools daily over normal; moderate increase in ostomy output.   -  SEVERE (SCALE 8-10; OR "WORST POSSIBLE"): Increase of 7 or more stools daily over normal; moderate increase in ostomy output; incontinence.     It comes and goes for last 3-4 days.   I have not taken anything for it.   I've had it several times a day.     2. ONSET: "When did the diarrhea begin?"      3-4 days ago 3. BM CONSISTENCY: "How loose or watery is the diarrhea?"      watery 4. VOMITING: "Are you also vomiting?" If Yes, ask: "How many times in the past 24 hours?"      No 5. ABDOMEN PAIN: "Are you having any abdomen pain?" If Yes, ask: "What does it feel like?" (e.g., crampy, dull, intermittent, constant)      No 6. ABDOMEN PAIN SEVERITY: If present, ask: "How bad is the pain?"  (e.g., Scale 1-10; mild, moderate, or severe)   - MILD (1-3): doesn't interfere with normal activities, abdomen soft and not tender to touch    - MODERATE (4-7): interferes with normal activities or awakens from  sleep, abdomen tender to touch    - SEVERE (8-10): excruciating pain, doubled over, unable to do any normal activities       None 7. ORAL INTAKE: If vomiting, "Have you been able to drink liquids?" "How much liquids have you had in the past 24 hours?"     I'm drinking fluids 8. HYDRATION: "Any signs of dehydration?" (e.g., dry mouth [not just dry lips], too weak to stand, dizziness, new weight loss) "When did you last urinate?"     No 9. EXPOSURE: "Have you traveled to a foreign country recently?" "Have you been exposed to anyone with diarrhea?" "Could you have eaten any food that was spoiled?"     Not asked 10. ANTIBIOTIC USE: "Are you taking antibiotics now or have you taken antibiotics in the past 2 months?"       No 11. OTHER SYMPTOMS: "Do you have any other symptoms?" (e.g., fever, blood in stool)       Coughing a lot, nasal congestion and chest congestion.   The mucus is green looking.   I tried Catering manager plus.   No sore throat.   No ear pain.   I'm clammy.    12. PREGNANCY: "Is there any chance you are pregnant?" "When was your last  menstrual period?"       N/A Due to age  Protocols used: [facility]

## 2022-12-08 NOTE — Progress Notes (Deleted)
      Established patient visit   Patient: Carrie Alvarez   DOB: 03-Sep-1950   72 y.o. Female  MRN: [id number] Visit Date: 12/08/2022  Today's healthcare provider: [name], PA-C   No chief complaint on file.  Subjective    HPI  ***  Medications: Outpatient Medications Prior to Visit  Medication Sig   acetaminophen (TYLENOL) 500 MG tablet Take 500 mg by mouth every 6 (six) hours as needed for mild pain or moderate pain.    albuterol (VENTOLIN HFA) 108 (90 Base) MCG/ACT inhaler INHALE 2 PUFFS BY MOUTH EVERY 6 HOURS AS NEEDED FOR WHEEZING FOR SHORTNESS OF BREATH   amLODipine (NORVASC) 5 MG tablet Take 1 tablet (5 mg total) by mouth daily.   atorvastatin (LIPITOR) 80 MG tablet Take 1 tablet (80 mg total) by mouth daily.   baclofen (LIORESAL) 10 MG tablet Take 1 tablet (10 mg total) by mouth 2 (two) times daily.   cetirizine (ZYRTEC) 10 MG tablet Take 10 mg by mouth daily at 6 (six) AM.   cholecalciferol (VITAMIN D) 1000 units tablet Take 1,000 Units by mouth daily.   fluticasone (FLONASE) 50 MCG/ACT nasal spray USE 2 SPRAY(S) IN EACH NOSTRIL ONCE DAILY   furosemide (LASIX) 20 MG tablet Take 1 tablet (20 mg total) by mouth daily as needed for fluid.   gabapentin (NEURONTIN) 300 MG capsule Take 300-600 mg by mouth 4 (four) times daily as needed (neuropathic pain).    letrozole (FEMARA) 2.5 MG tablet Take 2.5 mg by mouth daily.   NARCAN 4 MG/0.1ML LIQD nasal spray kit Place 1 spray into the nose.    Oxycodone HCl 10 MG TABS LIMIT ONE HALF TO ONE TABLET BY MOUTH 3 TO 5 TIMES DAILY IF TOLERATED   predniSONE (DELTASONE) 20 MG tablet Take 1 tablet (20 mg total) by mouth daily with breakfast.   PREVIDENT 5000 DRY MOUTH 1.1 % GEL dental gel Place onto teeth at bedtime.   pyridoxine (B-6) 100 MG tablet Take 100 mg by mouth daily.   sertraline (ZOLOFT) 100 MG tablet Take 2 tablets by mouth once daily   traZODone (DESYREL) 50 MG tablet TAKE 1 TO 2 TABLETS BY MOUTH AT BEDTIME AS  NEEDED FOR SLEEP   vitamin B-12 (CYANOCOBALAMIN) 500 MCG tablet Take 500 mcg by mouth daily.   Facility-Administered Medications Prior to Visit  Medication Dose Route Frequency Provider   heparin lock flush 100 unit/mL  500 Units Intravenous Once Jeralyn Ruths, MD   heparin lock flush 100 unit/mL  500 Units Intravenous Once Jeralyn Ruths, MD   sodium chloride flush (NS) 0.9 % injection 10 mL  10 mL Intravenous PRN Jeralyn Ruths, MD    Review of Systems  {Labs  Heme  Chem  Endocrine  Serology  Results Review (optional):23779}   Objective    There were no vitals taken for this visit. {Show previous vital signs (optional):23777}  Physical Exam  ***  No results found for any visits on 12/08/22.  Assessment & Plan     ***  No follow-ups on file.      {provider attestation***:1}   [name], PA-C  [facility] [phone] (phone) [phone] (fax)  [facility]

## 2022-12-08 NOTE — Progress Notes (Signed)
I,Carrie Alvarez,acting as a Neurosurgeon for [employer], PA-C.,have documented all relevant documentation on the behalf of Carrie Ferguson, PA-C,as directed by  Carrie Ferguson, PA-C while in the presence of Carrie Ferguson, PA-C.   Established patient visit   Patient: Carrie Alvarez   DOB: 10-Jan-1951   72 y.o. Female  MRN: [id number] Visit Date: 12/08/2022  Today's healthcare provider: [name], PA-C   Cc. Cough, nasal congestion  Subjective    HPI  Pt reports diarrhea, nasal congestion, cough x 3-5 days. Pt reports cough might have started over a week ago. Reports cough is productive w/ green/yellow mucous. She took some things otc with limited relief. Reports taking something for the diarrhea that helped. Denies fever, reports fatigue. Denies SOB, wheezing.   Medications: Outpatient Medications Prior to Visit  Medication Sig   acetaminophen (TYLENOL) 500 MG tablet Take 500 mg by mouth every 6 (six) hours as needed for mild pain or moderate pain.    albuterol (VENTOLIN HFA) 108 (90 Base) MCG/ACT inhaler INHALE 2 PUFFS BY MOUTH EVERY 6 HOURS AS NEEDED FOR WHEEZING FOR SHORTNESS OF BREATH   amLODipine (NORVASC) 5 MG tablet Take 1 tablet (5 mg total) by mouth daily.   atorvastatin (LIPITOR) 80 MG tablet Take 1 tablet (80 mg total) by mouth daily.   baclofen (LIORESAL) 10 MG tablet Take 1 tablet (10 mg total) by mouth 2 (two) times daily.   cetirizine (ZYRTEC) 10 MG tablet Take 10 mg by mouth daily at 6 (six) AM.   cholecalciferol (VITAMIN D) 1000 units tablet Take 1,000 Units by mouth daily.   fluticasone (FLONASE) 50 MCG/ACT nasal spray USE 2 SPRAY(S) IN EACH NOSTRIL ONCE DAILY   furosemide (LASIX) 20 MG tablet Take 1 tablet (20 mg total) by mouth daily as needed for fluid.   gabapentin (NEURONTIN) 300 MG capsule Take 300-600 mg by mouth 4 (four) times daily as needed (neuropathic pain).    letrozole (FEMARA) 2.5 MG tablet Take 2.5 mg by mouth daily.   NARCAN 4  MG/0.1ML LIQD nasal spray kit Place 1 spray into the nose.    Oxycodone HCl 10 MG TABS LIMIT ONE HALF TO ONE TABLET BY MOUTH 3 TO 5 TIMES DAILY IF TOLERATED   predniSONE (DELTASONE) 20 MG tablet Take 1 tablet (20 mg total) by mouth daily with breakfast.   PREVIDENT 5000 DRY MOUTH 1.1 % GEL dental gel Place onto teeth at bedtime.   pyridoxine (B-6) 100 MG tablet Take 100 mg by mouth daily.   sertraline (ZOLOFT) 100 MG tablet Take 2 tablets by mouth once daily   traZODone (DESYREL) 50 MG tablet TAKE 1 TO 2 TABLETS BY MOUTH AT BEDTIME AS NEEDED FOR SLEEP   vitamin B-12 (CYANOCOBALAMIN) 500 MCG tablet Take 500 mcg by mouth daily.   Facility-Administered Medications Prior to Visit  Medication Dose Route Frequency Provider   heparin lock flush 100 unit/mL  500 Units Intravenous Once Jeralyn Ruths, MD   heparin lock flush 100 unit/mL  500 Units Intravenous Once Jeralyn Ruths, MD   sodium chloride flush (NS) 0.9 % injection 10 mL  10 mL Intravenous PRN Jeralyn Ruths, MD    Review of Systems  Constitutional:  Positive for fatigue. Negative for fever.  HENT:  Positive for congestion and rhinorrhea.   Respiratory:  Positive for cough. Negative for shortness of breath.   Cardiovascular:  Negative for chest pain and leg swelling.  Gastrointestinal:  Positive for diarrhea. Negative for abdominal  pain.  Neurological:  Negative for dizziness and headaches.      Objective    BP (!) 127/98 (BP Location: Right Arm, Patient Position: Sitting, Cuff Size: Large)   Pulse 88   Temp 98.6 F (37 C) (Oral)   Ht 5\' 3"  (1.6 m)   Wt 199 lb (90.3 kg)   SpO2 100%   BMI 35.25 kg/m    Physical Exam Constitutional:      General: She is awake.     Appearance: She is well-developed.  HENT:     Head: Normocephalic.  Eyes:     Conjunctiva/sclera: Conjunctivae normal.  Cardiovascular:     Rate and Rhythm: Normal rate and regular rhythm.     Heart sounds: Normal heart sounds.  Pulmonary:      Effort: Pulmonary effort is normal.     Breath sounds: Normal breath sounds. No wheezing, rhonchi or rales.  Skin:    General: Skin is warm.  Neurological:     Mental Status: She is alert and oriented to person, place, and time.  Psychiatric:        Attention and Perception: Attention normal.        Mood and Affect: Mood normal.        Speech: Speech normal.        Behavior: Behavior is cooperative.      Results for orders placed or performed in visit on 12/08/22  POC COVID-19  Result Value Ref Range   SARS Coronavirus 2 Ag Negative Negative    Assessment & Plan     1. Acute bacterial bronchitis COVID negative. Rx tessalon.  Encouraged pt to use albuterol inhaler BID and take mucinex otc. Increase fluids Likely viral and needs to break up the congestion in her chest.   Advised if symptoms do not improve with above tx in the next 1-2 days, ok to take antibiotic. If symptoms do not improve in the next week would do chest xray  She has had now 3 respiratory infections need abx therapy in the last 5 months. Advised if her symptoms do not completely resolve I would refer to pulm for testing.  - benzonatate (TESSALON) 100 MG capsule; Take 1 capsule (100 mg total) by mouth 2 (two) times daily as needed for cough.  Dispense: 20 capsule; Refill: 0 - amoxicillin (AMOXIL) 875 MG tablet; Take 1 tablet (875 mg total) by mouth 2 (two) times daily for 7 days.  Dispense: 14 tablet; Refill: 0   Return if symptoms worsen or fail to improve.      I, Carrie Ferguson, PA-C have reviewed all documentation for this visit. The documentation on  12/08/22   for the exam, diagnosis, procedures, and orders are all accurate and complete. Carrie Ferguson, PA-C [facility] [address] [place], [place], [address] Office: [phone] Fax: [phone]   [facility]

## 2022-12-08 NOTE — Telephone Encounter (Signed)
  Chief Complaint: Diarrhea, coughing and nasal congestion Symptoms: above for last 3-4 days Frequency: last 3-4 days Pertinent Negatives: Patient denies sore throat or fever. Disposition: [] ED /[] Urgent Care (no appt availability in office) / [x] Appointment(In office/virtual)/ []  [facility]/ [] Home Care/ [] Refused Recommended Disposition /[] [facility]/ []  Follow-up with PCP Additional Notes: Appt. Made today with [name], PA-C for 1:20.   Encouraged her to drink plenty of fluids.

## 2022-12-09 ENCOUNTER — Other Ambulatory Visit: Payer: Self-pay | Admitting: Family Medicine

## 2022-12-09 DIAGNOSIS — G5600 Carpal tunnel syndrome, unspecified upper limb: Secondary | ICD-10-CM | POA: Diagnosis not present

## 2022-12-09 DIAGNOSIS — J40 Bronchitis, not specified as acute or chronic: Secondary | ICD-10-CM

## 2022-12-09 DIAGNOSIS — M62830 Muscle spasm of back: Secondary | ICD-10-CM | POA: Diagnosis not present

## 2022-12-09 DIAGNOSIS — G8929 Other chronic pain: Secondary | ICD-10-CM | POA: Diagnosis not present

## 2022-12-09 DIAGNOSIS — M479 Spondylosis, unspecified: Secondary | ICD-10-CM | POA: Diagnosis not present

## 2022-12-09 DIAGNOSIS — G894 Chronic pain syndrome: Secondary | ICD-10-CM | POA: Diagnosis not present

## 2022-12-17 DIAGNOSIS — M25561 Pain in right knee: Secondary | ICD-10-CM | POA: Diagnosis not present

## 2022-12-17 DIAGNOSIS — G8929 Other chronic pain: Secondary | ICD-10-CM | POA: Diagnosis not present

## 2022-12-17 DIAGNOSIS — M25562 Pain in left knee: Secondary | ICD-10-CM | POA: Diagnosis not present

## 2022-12-17 DIAGNOSIS — M1711 Unilateral primary osteoarthritis, right knee: Secondary | ICD-10-CM | POA: Diagnosis not present

## 2023-01-04 DIAGNOSIS — M479 Spondylosis, unspecified: Secondary | ICD-10-CM | POA: Diagnosis not present

## 2023-01-04 DIAGNOSIS — G8929 Other chronic pain: Secondary | ICD-10-CM | POA: Diagnosis not present

## 2023-01-04 DIAGNOSIS — I10 Essential (primary) hypertension: Secondary | ICD-10-CM | POA: Diagnosis not present

## 2023-01-04 DIAGNOSIS — G5600 Carpal tunnel syndrome, unspecified upper limb: Secondary | ICD-10-CM | POA: Diagnosis not present

## 2023-01-04 DIAGNOSIS — M62830 Muscle spasm of back: Secondary | ICD-10-CM | POA: Diagnosis not present

## 2023-01-04 DIAGNOSIS — G894 Chronic pain syndrome: Secondary | ICD-10-CM | POA: Diagnosis not present

## 2023-01-04 DIAGNOSIS — R202 Paresthesia of skin: Secondary | ICD-10-CM | POA: Diagnosis not present

## 2023-01-13 ENCOUNTER — Ambulatory Visit (INDEPENDENT_AMBULATORY_CARE_PROVIDER_SITE_OTHER): Payer: 59

## 2023-01-13 VITALS — Ht 63.0 in | Wt 199.0 lb

## 2023-01-13 DIAGNOSIS — Z0000 Encounter for general adult medical examination without abnormal findings: Secondary | ICD-10-CM

## 2023-01-13 DIAGNOSIS — M1711 Unilateral primary osteoarthritis, right knee: Secondary | ICD-10-CM | POA: Diagnosis not present

## 2023-01-13 NOTE — Progress Notes (Signed)
Subjective:   Carrie Alvarez is a 72 y.o. female who presents for Medicare Annual (Subsequent) preventive examination.  Visit Complete: Virtual  I connected with  Carrie Alvarez on 01/13/23 by a audio enabled telemedicine application and verified that I am speaking with the correct person using two identifiers.  Patient Location: Home  Provider Location: Home Office  I discussed the limitations of evaluation and management by telemedicine. The patient expressed understanding and agreed to proceed.  Patient Medicare AWV questionnaire was completed by the patient on (not done); I have confirmed that all information answered by patient is correct and no changes since this date.  Review of Systems         Objective:    There were no vitals filed for this visit. There is no height or weight on file to calculate BMI.     10/12/2022   11:14 AM 05/12/2022    9:36 AM 04/09/2022   10:12 AM 03/11/2022    2:45 PM 10/09/2021    2:20 PM 10/07/2021   10:10 AM 09/28/2021    4:20 PM  Advanced Directives  Does Patient Have a Medical Advance Directive? No No No No No No No  Does patient want to make changes to medical advance directive? No - Patient declined        Would patient like information on creating a medical advance directive?  No - Patient declined   No - Patient declined No - Patient declined     Current Medications (verified) Outpatient Encounter Medications as of 01/13/2023  Medication Sig   acetaminophen (TYLENOL) 500 MG tablet Take 500 mg by mouth every 6 (six) hours as needed for mild pain or moderate pain.    albuterol (VENTOLIN HFA) 108 (90 Base) MCG/ACT inhaler INHALE 2 PUFFS BY MOUTH EVERY 6 HOURS AS NEEDED FOR WHEEZING AND FOR SHORTNESS OF BREATH   amLODipine (NORVASC) 5 MG tablet Take 1 tablet (5 mg total) by mouth daily.   atorvastatin (LIPITOR) 80 MG tablet Take 1 tablet (80 mg total) by mouth daily.   baclofen (LIORESAL) 10 MG tablet Take 1 tablet (10 mg  total) by mouth 2 (two) times daily.   benzonatate (TESSALON) 100 MG capsule Take 1 capsule (100 mg total) by mouth 2 (two) times daily as needed for cough.   cetirizine (ZYRTEC) 10 MG tablet Take 10 mg by mouth daily at 6 (six) AM.   cholecalciferol (VITAMIN D) 1000 units tablet Take 1,000 Units by mouth daily.   fluticasone (FLONASE) 50 MCG/ACT nasal spray USE 2 SPRAY(S) IN EACH NOSTRIL ONCE DAILY   furosemide (LASIX) 20 MG tablet Take 1 tablet (20 mg total) by mouth daily as needed for fluid.   gabapentin (NEURONTIN) 300 MG capsule Take 300-600 mg by mouth 4 (four) times daily as needed (neuropathic pain).    letrozole (FEMARA) 2.5 MG tablet Take 2.5 mg by mouth daily.   NARCAN 4 MG/0.1ML LIQD nasal spray kit Place 1 spray into the nose.    Oxycodone HCl 10 MG TABS LIMIT ONE HALF TO ONE TABLET BY MOUTH 3 TO 5 TIMES DAILY IF TOLERATED   predniSONE (DELTASONE) 20 MG tablet Take 1 tablet (20 mg total) by mouth daily with breakfast.   PREVIDENT 5000 DRY MOUTH 1.1 % GEL dental gel Place onto teeth at bedtime.   pyridoxine (B-6) 100 MG tablet Take 100 mg by mouth daily.   sertraline (ZOLOFT) 100 MG tablet Take 2 tablets by mouth once daily   traZODone (  DESYREL) 50 MG tablet TAKE 1 TO 2 TABLETS BY MOUTH AT BEDTIME AS NEEDED FOR SLEEP   vitamin B-12 (CYANOCOBALAMIN) 500 MCG tablet Take 500 mcg by mouth daily.   Facility-Administered Encounter Medications as of 01/13/2023  Medication   heparin lock flush 100 unit/mL   heparin lock flush 100 unit/mL   sodium chloride flush (NS) 0.9 % injection 10 mL    Allergies (verified) Patient has no known allergies.   History: Past Medical History:  Diagnosis Date   Allergy    Anemia    Arthritis    Back pain    Breast cancer (HCC) 2015   LT LUMPECTOMY 12-2014 FOLLOWING CHEMO   Carpal tunnel syndrome    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   Cataract    Chronic pain    Colon cancer (HCC) 01/2019   Colon cancer (HCC) 2020   Depression     Dyspnea    Family history of breast cancer    Family history of colon cancer    GERD (gastroesophageal reflux disease)    problem with reflux during chemo   History of methicillin resistant staphylococcus aureus (MRSA)    Hypercholesteremia    Hypertension    Lumbosacral pain    sees pain management in [place]   Obesity    Personal history of chemotherapy 2016   left breast ca   Personal history of chemotherapy 2020   colon ca   Pinched nerve    left side, lumbar area   Uterine cancer (HCC) 1994   Past Surgical History:  Procedure Laterality Date   ABDOMINAL HYSTERECTOMY  1994   UTERINE CA   AXILLARY LYMPH NODE BIOPSY Left 12/18/2014   Procedure: AXILLARY LYMPH NODE BIOPSY/;  Surgeon: Earline Mayotte, MD;  Location: [facility];  Service: General;  Laterality: Left;   AXILLARY LYMPH NODE DISSECTION Left 12/18/2014   Procedure: AXILLARY LYMPH NODE DISSECTION;  Surgeon: Earline Mayotte, MD;  Location: [facility];  Service: General;  Laterality: Left;   BREAST BIOPSY Left 05/2014   CORE BX OF LN, METASTATIC ADENOCARCINOMA   BREAST LUMPECTOMY Left 12/2014   CHEMO FIRST THEN SURGERY OF LN   BREAST SURGERY     lymph node removal   CATARACT EXTRACTION W/PHACO Left 11/26/2020   Procedure: CATARACT EXTRACTION PHACO AND INTRAOCULAR LENS PLACEMENT (IOC) LEFT;  Surgeon: Galen Manila, MD;  Location: [facility];  Service: Ophthalmology;  Laterality: Left;  6.40 00:43.9   CATARACT EXTRACTION W/PHACO Right 12/10/2020   Procedure: CATARACT EXTRACTION PHACO AND INTRAOCULAR LENS PLACEMENT (IOC) RIGHT;  Surgeon: Galen Manila, MD;  Location: [facility];  Service: Ophthalmology;  Laterality: Right;  7.59 0:47.4   CHOLECYSTECTOMY N/A 04/19/2016   Procedure: LAPAROSCOPIC CHOLECYSTECTOMY WITH INTRAOPERATIVE CHOLANGIOGRAM;  Surgeon: Gladis Riffle, MD;  Location: [facility];  Service: General;  Laterality: N/A;   COLON RESECTION Right 01/10/2019   Procedure: HAND ASSISTED  LAPAROSCOPIC RIGHT COLON RESECTION;  Surgeon: Leafy Ro, MD;  Location: [facility];  Service: General;  Laterality: Right;   COLONOSCOPY WITH PROPOFOL N/A 12/30/2018   Procedure: COLONOSCOPY WITH PROPOFOL;  Surgeon: Midge Minium, MD;  Location: [facility];  Service: Endoscopy;  Laterality: N/A;   COLONOSCOPY WITH PROPOFOL N/A 03/05/2020   Procedure: COLONOSCOPY WITH PROPOFOL;  Surgeon: Midge Minium, MD;  Location: [facility];  Service: Endoscopy;  Laterality: N/A;   COLONOSCOPY WITH PROPOFOL N/A 05/12/2022   Procedure: COLONOSCOPY WITH PROPOFOL;  Surgeon: Midge Minium, MD;  Location: [facility];  Service: Endoscopy;  Laterality: N/A;   medial branch block  11/06/2015   lumbar facet Dr. Metta Clines   POLYPECTOMY N/A 12/30/2018   Procedure: POLYPECTOMY;  Surgeon: Midge Minium, MD;  Location: [facility];  Service: Endoscopy;  Laterality: N/A;  Clips placed at Hepatic Flexure Polyp (2) and Transverse Colon Polyp (4) removal sites   PORTACATH PLACEMENT Right 02/02/2019   Procedure: INSERTION PORT-A-CATH;  Surgeon: Leafy Ro, MD;  Location: [facility];  Service: General;  Laterality: Right;   RADIOFREQUENCY ABLATION     lumbar   SENTINEL NODE BIOPSY Left 12/18/2014   Procedure: SENTINEL NODE BIOPSY;  Surgeon: Earline Mayotte, MD;  Location: [facility];  Service: General;  Laterality: Left;   Family History  Problem Relation Age of Onset   Breast cancer Sister 36   Colon cancer Sister    Colon cancer Mother        dx 34s   Hypertension Mother    Asthma Mother    Cancer Father        voice box removed   Cancer Maternal Grandmother        unsure type   Colon cancer Cousin    Cancer Cousin        unsure type   Social History   Socioeconomic History   Marital status: Married    Spouse name: Lyda Jester   Number of children: 3   Years of education: Not on file   Highest education level: Some college, no degree  Occupational History   Occupation: retired    Comment: was  on disability  Tobacco Use   Smoking status: Former    Packs/day: .5    Types: Cigarettes    Quit date: 07/18/2014    Years since quitting: 8.4   Smokeless tobacco: Never  Vaping Use   Vaping Use: Never used  Substance and Sexual Activity   Alcohol use: No    Alcohol/week: 0.0 standard drinks of alcohol   Drug use: No   Sexual activity: Not on file  Other Topics Concern   Not on file  Social History Narrative   Husband and patient have not lived together for 10 years.  He is still helpful with patient.   Social Determinants of Health   Financial Resource Strain: Low Risk  (10/09/2021)   Overall Financial Resource Strain (CARDIA)    Difficulty of Paying Living Expenses: Not hard at all  Food Insecurity: No Food Insecurity (10/09/2021)   Hunger Vital Sign    Worried About Running Out of Food in the Last Year: Never true    Ran Out of Food in the Last Year: Never true  Transportation Needs: No Transportation Needs (10/09/2021)   PRAPARE - Administrator, Civil Service (Medical): No    Lack of Transportation (Non-Medical): No  Physical Activity: Insufficiently Active (10/09/2021)   Exercise Vital Sign    Days of Exercise per Week: 2 days    Minutes of Exercise per Session: 20 min  Stress: No Stress Concern Present (10/09/2021)   [employer] of Occupational Health - Occupational Stress Questionnaire    Feeling of Stress : Only a little  Social Connections: Moderately Integrated (10/09/2021)   Social Connection and Isolation Panel [NHANES]    Frequency of Communication with Friends and Family: More than three times a week    Frequency of Social Gatherings with Friends and Family: More than three times a week    Attends Religious Services: More than 4 times per  year    Active Member of Clubs or Organizations: No    Attends Banker Meetings: Never    Marital Status: Married    Tobacco Counseling Counseling given: Not Answered   Clinical Intake:                         Activities of Daily Living     No data to display          Patient Care Team: Malva Limes, MD as PCP - General (Family Medicine) Jeralyn Ruths, MD as Consulting Physician (Oncology) Ewing Schlein, MD as Attending Physician (Pain Medicine) Irene Limbo., MD as Consulting Physician (Ophthalmology) Benita Gutter, RN as Oncology Nurse Navigator [name], Corliss Parish, MD as Referring Physician (Anesthesiology)  Indicate any recent Medical Services you may have received from other than [facility] providers in the past year (date may be approximate).     Assessment:   This is a routine wellness examination for Venita.  Hearing/Vision screen No results found.  Dietary issues and exercise activities discussed:     Goals Addressed   None    Depression Screen    03/18/2022   10:12 AM 10/09/2021    2:17 PM 07/23/2021   11:08 AM 11/21/2020    9:17 AM 10/30/2020    3:21 PM 10/03/2020    1:52 PM 10/02/2019    8:46 AM  PHQ 2/9 Scores  PHQ - 2 Score 0 0 1 1 0 0 0  PHQ- 9 Score 4  5 3 6       Fall Risk    10/09/2021    2:22 PM 11/21/2020    9:17 AM 10/30/2020    3:21 PM 10/03/2020    1:53 PM 03/20/2020   11:08 AM  Fall Risk   Falls in the past year? 0 0 0 0 0  Number falls in past yr: 1 0 0 0   Injury with Fall? 0 0 0 0   Risk for fall due to : No Fall Risks  No Fall Risks    Follow up Falls evaluation completed Falls evaluation completed Falls evaluation completed      MEDICARE RISK AT HOME:   TIMED UP AND GO:  Was the test performed?  No    Cognitive Function:        10/02/2019    8:52 AM 09/22/2016    9:56 AM  6CIT Screen  What Year? 0 points 0 points  What month? 0 points 0 points  What time? 0 points 0 points  Count back from 20 0 points 0 points  Months in reverse 0 points 0 points  Repeat phrase 0 points 0 points  Total Score 0 points 0 points    Immunizations Immunization History  Administered Date(s)  Administered   Fluad Quad(high Dose 65+) 03/23/2019, 06/25/2020, 03/24/2022   Influenza, High Dose Seasonal PF 06/07/2017, 06/07/2018   Influenza,inj,Quad PF,6+ Mos 04/20/2016   PFIZER Comirnaty(Gray Top)Covid-19 Tri-Sucrose Vaccine 11/21/2020   PFIZER(Purple Top)SARS-COV-2 Vaccination 09/06/2019, 09/27/2019, 06/25/2020   Pneumococcal Conjugate-13 09/22/2016   Pneumococcal Polysaccharide-23 09/28/2017    TDAP status: Up to date  Flu Vaccine status: Up to date  Pneumococcal vaccine status: Up to date  Covid-19 vaccine status: Completed vaccines  Qualifies for Shingles Vaccine? Yes   Zostavax completed No   Shingrix Completed?: No.    Education has been provided regarding the importance of this vaccine. Patient has been advised to call insurance company to  determine out of pocket expense if they have not yet received this vaccine. Advised may also receive vaccine at local pharmacy or Health Dept. Verbalized acceptance and understanding.  Screening Tests Health Maintenance  Topic Date Due   DTaP/Tdap/Td (1 - Tdap) Never done   Zoster Vaccines- Shingrix (1 of 2) Never done   COVID-19 Vaccine (5 - 2023-24 season) 03/13/2022   Medicare Annual Wellness (AWV)  10/10/2022   DEXA SCAN  02/04/2023   INFLUENZA VACCINE  02/11/2023   MAMMOGRAM  07/20/2024   Colonoscopy  05/13/2027   Pneumonia Vaccine 75+ Years old  Completed   Hepatitis C Screening  Completed   HPV VACCINES  Aged Out    Health Maintenance  Health Maintenance Due  Topic Date Due   DTaP/Tdap/Td (1 - Tdap) Never done   Zoster Vaccines- Shingrix (1 of 2) Never done   COVID-19 Vaccine (5 - 2023-24 season) 03/13/2022   Medicare Annual Wellness (AWV)  10/10/2022    Colorectal cancer screening: Type of screening: Colonoscopy. Completed yes. Repeat every 5-10 years  Mammogram status: Completed yes. Repeat every year  Bone Density status: Completed yes. Results reflect: Bone density results: NORMAL. Repeat every 5  years.  Lung Cancer Screening: (Low Dose CT Chest recommended if Age 7-80 years, 20 pack-year currently smoking OR have quit w/in 15years.) does not qualify.   Lung Cancer Screening Referral: no  Additional Screening:  Hepatitis C Screening: does not qualify; Completed yes  Vision Screening: Recommended annual ophthalmology exams for early detection of glaucoma and other disorders of the eye. Is the patient up to date with their annual eye exam?  Yes  Who is the provider or what is the name of the office in which the patient attends annual eye exams? Dr Melanie Crazier If pt is not established with a provider, would they like to be referred to a provider to establish care? No .   Dental Screening: Recommended annual dental exams for proper oral hygiene  Diabetic Foot Exam: n/a  Community Resource Referral / Chronic Care Management: CRR required this visit?  No   CCM required this visit?  Appt scheduled with PCP    Plan:     I have personally reviewed and noted the following in the patient's chart:   Medical and social history Use of alcohol, tobacco or illicit drugs  Current medications and supplements including opioid prescriptions. Patient is not currently taking opioid prescriptions. Functional ability and status Nutritional status Physical activity Advanced directives List of other physicians Hospitalizations, surgeries, and ER visits in previous 12 months Vitals Screenings to include cognitive, depression, and falls Referrals and appointments  In addition, I have reviewed and discussed with patient certain preventive protocols, quality metrics, and best practice recommendations. A written personalized care plan for preventive services as well as general preventive health recommendations were provided to patient.    Sue Lush, LPN   07/18/1094   After Visit Summary: (MyChart) Due to this being a telephonic visit, the after visit summary with patients personalized  plan was offered to patient via MyChart   Nurse Notes: The patient states she is doing well and has no concerns or questions at this time.

## 2023-01-13 NOTE — Patient Instructions (Signed)
Carrie Alvarez , Thank you for taking time to come for your Medicare Wellness Visit. I appreciate your ongoing commitment to your health goals. Please review the following plan we discussed and let me know if I can assist you in the future.   These are the goals we discussed:  Goals      DIET - EAT MORE FRUITS AND VEGETABLES     DIET - REDUCE PORTION SIZE     Recommend to decrease portion sizes by eating 3 small healthy meals and at least 2 healthy snacks per day.        This is a list of the screening recommended for you and due dates:  Health Maintenance  Topic Date Due   DTaP/Tdap/Td vaccine (1 - Tdap) Never done   Zoster (Shingles) Vaccine (1 of 2) Never done   COVID-19 Vaccine (5 - 2023-24 season) 03/13/2022   DEXA scan (bone density measurement)  02/04/2023   Flu Shot  02/11/2023   Medicare Annual Wellness Visit  01/13/2024   Mammogram  07/20/2024   Colon Cancer Screening  05/13/2027   Pneumonia Vaccine  Completed   Hepatitis C Screening  Completed   HPV Vaccine  Aged Out    Advanced directives: no  Conditions/risks identified: moderate falls risk  Next appointment: Follow up in one year for your annual wellness visit 01/18/2024 @ 2pm telephone   Preventive Care 65 Years and Older, Female Preventive care refers to lifestyle choices and visits with your health care provider that can promote health and wellness. What does preventive care include? A yearly physical exam. This is also called an annual well check. Dental exams once or twice a year. Routine eye exams. Ask your health care provider how often you should have your eyes checked. Personal lifestyle choices, including: Daily care of your teeth and gums. Regular physical activity. Eating a healthy diet. Avoiding tobacco and drug use. Limiting alcohol use. Practicing safe sex. Taking low-dose aspirin every day. Taking vitamin and mineral supplements as recommended by your health care provider. What happens  during an annual well check? The services and screenings done by your health care provider during your annual well check will depend on your age, overall health, lifestyle risk factors, and family history of disease. Counseling  Your health care provider may ask you questions about your: Alcohol use. Tobacco use. Drug use. Emotional well-being. Home and relationship well-being. Sexual activity. Eating habits. History of falls. Memory and ability to understand (cognition). Work and work Astronomer. Reproductive health. Screening  You may have the following tests or measurements: Height, weight, and BMI. Blood pressure. Lipid and cholesterol levels. These may be checked every 5 years, or more frequently if you are over 33 years old. Skin check. Lung cancer screening. You may have this screening every year starting at age 91 if you have a 30-pack-year history of smoking and currently smoke or have quit within the past 15 years. Fecal occult blood test (FOBT) of the stool. You may have this test every year starting at age 34. Flexible sigmoidoscopy or colonoscopy. You may have a sigmoidoscopy every 5 years or a colonoscopy every 10 years starting at age 39. Hepatitis C blood test. Hepatitis B blood test. Sexually transmitted disease (STD) testing. Diabetes screening. This is done by checking your blood sugar (glucose) after you have not eaten for a while (fasting). You may have this done every 1-3 years. Bone density scan. This is done to screen for osteoporosis. You may have this done  starting at age 62. Mammogram. This may be done every 1-2 years. Talk to your health care provider about how often you should have regular mammograms. Talk with your health care provider about your test results, treatment options, and if necessary, the need for more tests. Vaccines  Your health care provider may recommend certain vaccines, such as: Influenza vaccine. This is recommended every  year. Tetanus, diphtheria, and acellular pertussis (Tdap, Td) vaccine. You may need a Td booster every 10 years. Zoster vaccine. You may need this after age 54. Pneumococcal 13-valent conjugate (PCV13) vaccine. One dose is recommended after age 102. Pneumococcal polysaccharide (PPSV23) vaccine. One dose is recommended after age 2. Talk to your health care provider about which screenings and vaccines you need and how often you need them. This information is not intended to replace advice given to you by your health care provider. Make sure you discuss any questions you have with your health care provider. Document Released: 07/26/2015 Document Revised: 03/18/2016 Document Reviewed: 04/30/2015 Elsevier Interactive Patient Education  2017 [place] Prevention in the Home Falls can cause injuries. They can happen to people of all ages. There are many things you can do to make your home safe and to help prevent falls. What can I do on the outside of my home? Regularly fix the edges of walkways and driveways and fix any cracks. Remove anything that might make you trip as you walk through a door, such as a raised step or threshold. Trim any bushes or trees on the path to your home. Use bright outdoor lighting. Clear any walking paths of anything that might make someone trip, such as rocks or tools. Regularly check to see if handrails are loose or broken. Make sure that both sides of any steps have handrails. Any raised decks and porches should have guardrails on the edges. Have any leaves, snow, or ice cleared regularly. Use sand or salt on walking paths during winter. Clean up any spills in your garage right away. This includes oil or grease spills. What can I do in the bathroom? Use night lights. Install grab bars by the toilet and in the tub and shower. Do not use towel bars as grab bars. Use non-skid mats or decals in the tub or shower. If you need to sit down in the shower, use a  plastic, non-slip stool. Keep the floor dry. Clean up any water that spills on the floor as soon as it happens. Remove soap buildup in the tub or shower regularly. Attach bath mats securely with double-sided non-slip rug tape. Do not have throw rugs and other things on the floor that can make you trip. What can I do in the bedroom? Use night lights. Make sure that you have a light by your bed that is easy to reach. Do not use any sheets or blankets that are too big for your bed. They should not hang down onto the floor. Have a firm chair that has side arms. You can use this for support while you get dressed. Do not have throw rugs and other things on the floor that can make you trip. What can I do in the kitchen? Clean up any spills right away. Avoid walking on wet floors. Keep items that you use a lot in easy-to-reach places. If you need to reach something above you, use a strong step stool that has a grab bar. Keep electrical cords out of the way. Do not use floor polish or wax that  makes floors slippery. If you must use wax, use non-skid floor wax. Do not have throw rugs and other things on the floor that can make you trip. What can I do with my stairs? Do not leave any items on the stairs. Make sure that there are handrails on both sides of the stairs and use them. Fix handrails that are broken or loose. Make sure that handrails are as long as the stairways. Check any carpeting to make sure that it is firmly attached to the stairs. Fix any carpet that is loose or worn. Avoid having throw rugs at the top or bottom of the stairs. If you do have throw rugs, attach them to the floor with carpet tape. Make sure that you have a light switch at the top of the stairs and the bottom of the stairs. If you do not have them, ask someone to add them for you. What else can I do to help prevent falls? Wear shoes that: Do not have high heels. Have rubber bottoms. Are comfortable and fit you  well. Are closed at the toe. Do not wear sandals. If you use a stepladder: Make sure that it is fully opened. Do not climb a closed stepladder. Make sure that both sides of the stepladder are locked into place. Ask someone to hold it for you, if possible. Clearly mark and make sure that you can see: Any grab bars or handrails. First and last steps. Where the edge of each step is. Use tools that help you move around (mobility aids) if they are needed. These include: Canes. Walkers. Scooters. Crutches. Turn on the lights when you go into a dark area. Replace any light bulbs as soon as they burn out. Set up your furniture so you have a clear path. Avoid moving your furniture around. If any of your floors are uneven, fix them. If there are any pets around you, be aware of where they are. Review your medicines with your doctor. Some medicines can make you feel dizzy. This can increase your chance of falling. Ask your doctor what other things that you can do to help prevent falls. This information is not intended to replace advice given to you by your health care provider. Make sure you discuss any questions you have with your health care provider. Document Released: 04/25/2009 Document Revised: 12/05/2015 Document Reviewed: 08/03/2014 Elsevier Interactive Patient Education  2017 [employer].

## 2023-02-08 ENCOUNTER — Emergency Department: Payer: 59

## 2023-02-08 ENCOUNTER — Other Ambulatory Visit: Payer: Self-pay

## 2023-02-08 ENCOUNTER — Encounter: Payer: Self-pay | Admitting: Emergency Medicine

## 2023-02-08 ENCOUNTER — Emergency Department
Admission: EM | Admit: 2023-02-08 | Discharge: 2023-02-08 | Disposition: A | Discharge status: Home / Self Care | Payer: 59 | Attending: Emergency Medicine | Admitting: Emergency Medicine

## 2023-02-08 DIAGNOSIS — S199XXA Unspecified injury of neck, initial encounter: Secondary | ICD-10-CM | POA: Diagnosis not present

## 2023-02-08 DIAGNOSIS — Z8542 Personal history of malignant neoplasm of other parts of uterus: Secondary | ICD-10-CM | POA: Insufficient documentation

## 2023-02-08 DIAGNOSIS — Z85038 Personal history of other malignant neoplasm of large intestine: Secondary | ICD-10-CM | POA: Diagnosis not present

## 2023-02-08 DIAGNOSIS — W19XXXA Unspecified fall, initial encounter: Secondary | ICD-10-CM

## 2023-02-08 DIAGNOSIS — S0990XA Unspecified injury of head, initial encounter: Secondary | ICD-10-CM

## 2023-02-08 DIAGNOSIS — Z853 Personal history of malignant neoplasm of breast: Secondary | ICD-10-CM | POA: Insufficient documentation

## 2023-02-08 DIAGNOSIS — I10 Essential (primary) hypertension: Secondary | ICD-10-CM | POA: Diagnosis not present

## 2023-02-08 DIAGNOSIS — I6782 Cerebral ischemia: Secondary | ICD-10-CM | POA: Diagnosis not present

## 2023-02-08 DIAGNOSIS — W0110XA Fall on same level from slipping, tripping and stumbling with subsequent striking against unspecified object, initial encounter: Secondary | ICD-10-CM | POA: Diagnosis not present

## 2023-02-08 LAB — COMPREHENSIVE METABOLIC PANEL
ALT: 17 U/L (ref 0–44)
AST: 20 U/L (ref 15–41)
Albumin: 4 g/dL (ref 3.5–5.0)
Alkaline Phosphatase: 63 U/L (ref 38–126)
Anion gap: 8 (ref 5–15)
BUN: 13 mg/dL (ref 8–23)
CO2: 29 mmol/L (ref 22–32)
Calcium: 9.7 mg/dL (ref 8.9–10.3)
Chloride: 104 mmol/L (ref 98–111)
Creatinine, Ser: 1.13 mg/dL — ABNORMAL HIGH (ref 0.44–1.00)
GFR, Estimated: 52 mL/min — ABNORMAL LOW (ref >60)
Glucose, Bld: 87 mg/dL (ref 70–99)
Potassium: 4 mmol/L (ref 3.5–5.1)
Sodium: 141 mmol/L (ref 135–145)
Total Bilirubin: 1 mg/dL (ref 0.3–1.2)
Total Protein: 7.2 g/dL (ref 6.5–8.1)

## 2023-02-08 LAB — CBC
HCT: 31.6 % — ABNORMAL LOW (ref 36.0–46.0)
Hemoglobin: 11 g/dL — ABNORMAL LOW (ref 12.0–15.0)
MCH: 28.6 pg (ref 26.0–34.0)
MCHC: 34.8 g/dL (ref 30.0–36.0)
MCV: 82.3 fL (ref 80.0–100.0)
Platelets: 207 10*3/uL (ref 150–400)
RBC: 3.84 MIL/uL — ABNORMAL LOW (ref 3.87–5.11)
RDW: 14 % (ref 11.5–15.5)
WBC: 7.9 10*3/uL (ref 4.0–10.5)
nRBC: 0 % (ref 0.0–0.2)

## 2023-02-08 NOTE — ED Provider Notes (Signed)
[facility] Provider Note    Event Date/Time   First MD Initiated Contact with Patient 02/08/23 1143     (approximate)   History   Fall   HPI  Carrie Alvarez is a 72 y.o. female with a past medical history of obesity, stenosis, hypertension tobacco use who presents today for evaluation after a fall.  Patient reports that she tripped and fell this morning, and struck her head.  She does not think that she passed out.  She has been able to ambulate since the event.  She thinks that she landed on her left hip, but she does not have any pain in her left hip currently.  She denies any numbness, tingling, or weakness.  She is not on anticoagulation.  Patient Active Problem List   Diagnosis Date Noted   Personal history of colon cancer    Polyp of descending colon    Obesity (BMI 30-39.9) 10/01/2021   Anemia 09/28/2021   Neuropathy 08/12/2021   Aortic atherosclerosis (HCC) 08/12/2021   Lumbar facet arthropathy 06/20/2020   Anterolisthesis of lumbar spine 05/30/2020   Foraminal stenosis of lumbar region 05/30/2020   Spinal stenosis of lumbar region with neurogenic claudication 05/30/2020   Lumbar radiculopathy 04/22/2020   MLH1-related Lynch syndrome (HNPCC2) 06/01/2019   Genetic testing 06/01/2019   Family history of breast cancer    Family history of colon cancer    Goals of care, counseling/discussion 01/22/2019   Colon cancer (HCC) 01/10/2019   Personal history of colonic polyps    Polyp of transverse colon    Neoplasm of digestive system    Morbid obesity (HCC) 06/07/2018   Acute cholecystitis    Transaminitis    Cholangitis 04/18/2016   Allergic rhinitis 09/04/2015   AA (alopecia areata) 09/04/2015   Arthritis, degenerative 09/04/2015   Cancer of uterus (HCC) 09/04/2015   Avitaminosis D 09/04/2015   Benign positional vertigo 09/04/2015   Hypertension 03/15/2015   DDD (degenerative disc disease), lumbar 03/07/2015   Dizziness  12/25/2014   Lumbosacral facet joint syndrome 12/10/2014   Sacroiliac joint dysfunction 12/10/2014   DDD (degenerative disc disease), lumbosacral 11/13/2014   Depression 10/13/2014   Breast cancer metastasized to axillary lymph node (HCC) 06/21/2014   Hypercholesteremia 08/30/2009   Compulsive tobacco user syndrome 04/21/2008   Adaptation reaction 07/18/2007          Physical Exam   Triage Vital Signs: ED Triage Vitals  Encounter Vitals Group     BP 02/08/23 1058 102/83     Systolic BP Percentile --      Diastolic BP Percentile --      Pulse Rate 02/08/23 1058 77     Resp 02/08/23 1058 18     Temp 02/08/23 1058 97.8 F (36.6 C)     Temp Source 02/08/23 1058 Oral     SpO2 02/08/23 1058 97 %     Weight 02/08/23 1059 199 lb 1.2 oz (90.3 kg)     Height 02/08/23 1059 5\' 3"  (1.6 m)     Head Circumference --      Peak Flow --      Pain Score 02/08/23 1059 8     Pain Loc --      Pain Education --      Exclude from Growth Chart --     Most recent vital signs: Vitals:   02/08/23 1058  BP: 102/83  Pulse: 77  Resp: 18  Temp: 97.8 F (36.6 C)  SpO2: 97%  Physical Exam Vitals and nursing note reviewed.  Constitutional:      General: Awake and alert. No acute distress.    Appearance: Normal appearance. The patient is obese.  HENT:     Head: Normocephalic and atraumatic.     Mouth: Mucous membranes are moist.  Eyes:     General: PERRL. Normal EOMs        Right eye: No discharge.        Left eye: No discharge.     Conjunctiva/sclera: Conjunctivae normal.  Cardiovascular:     Rate and Rhythm: Normal rate and regular rhythm.     Pulses: Normal pulses.  Pulmonary:     Effort: Pulmonary effort is normal. No respiratory distress.     Breath sounds: Normal breath sounds.  Abdominal:     Abdomen is soft. There is no abdominal tenderness. No rebound or guarding. No distention. Musculoskeletal:        General: No swelling. Normal range of motion of bilateral upper and  lower extremities without evidence of acute injury.    Cervical back: Normal range of motion and neck supple. No midline cervical spine tenderness.  Full range of motion of neck.  Negative Spurling test.  Negative Lhermitte sign.  Normal strength and sensation in bilateral upper extremities. Normal grip strength bilaterally.  Normal intrinsic muscle function of the hand bilaterally.  Normal radial pulses bilaterally. Pelvis stable.  Negative logroll bilaterally.  Able to actively lift both legs up off of the stretcher.  No vertebral tenderness. Skin:    General: Skin is warm and dry.     Capillary Refill: Capillary refill takes less than 2 seconds.     Findings: No rash.  Neurological:     Mental Status: The patient is awake and alert.   Neurological: GCS 15 alert and oriented x3 Normal speech, no expressive or receptive aphasia or dysarthria Cranial nerves II through XII intact Normal visual fields 5 out of 5 strength in all 4 extremities with intact sensation throughout No extremity drift Normal finger-to-nose testing, no limb or truncal ataxia    ED Results / Procedures / Treatments   Labs (all labs ordered are listed, but only abnormal results are displayed) Labs Reviewed  CBC - Abnormal; Notable for the following components:      Result Value   RBC 3.84 (*)    Hemoglobin 11.0 (*)    HCT 31.6 (*)    All other components within normal limits  COMPREHENSIVE METABOLIC PANEL - Abnormal; Notable for the following components:   Creatinine, Ser 1.13 (*)    GFR, Estimated 52 (*)    All other components within normal limits     EKG     RADIOLOGY I independently reviewed and interpreted imaging and agree with radiologists findings.     PROCEDURES:  Critical Care performed:   Procedures   MEDICATIONS ORDERED IN ED: Medications - No data to display   IMPRESSION / MDM / ASSESSMENT AND PLAN / ED COURSE  I reviewed the triage vital signs and the nursing  notes.   Differential diagnosis includes, but is not limited to, contusion, concussion, intracranial hemorrhage, fracture, cervical spine injury.  Patient is awake and alert, hemodynamically stable and afebrile.  She is nontoxic in appearance.  She is neurologically and neurovascularly intact.  CT head and neck obtained in triage per [place] criteria and are negative for any acute findings.  She has no reproducible cervical spine tenderness on exam, and she has normal strength and  sensation in her bilateral upper extremities, normal grip strength bilaterally, not consistent with central cord syndrome.  She felt that she had landed on her left hip, though she is able to lift both legs up off of the stretcher and has full range of motion of all of her joints, she declined x-ray imaging.  We discussed return precautions and outpatient follow-up.  Patient understands and agrees with plan.  She is able to ambulate with a steady gait.  She was discharged in stable condition.   Patient's presentation is most consistent with acute complicated illness / injury requiring diagnostic workup.   FINAL CLINICAL IMPRESSION(S) / ED DIAGNOSES   Final diagnoses:  Fall, initial encounter  Injury of head, initial encounter     Rx / DC Orders   ED Discharge Orders     None        Note:  This document was prepared using Dragon voice recognition software and may include unintentional dictation errors.   Keturah Shavers 02/08/23 1235    Phineas Semen, MD 02/08/23 [phone]

## 2023-02-08 NOTE — Discharge Instructions (Signed)
Your blood work and CT of your head and neck were normal.  Please follow-up with your outpatient provider.  Please return for any new, worsening, or change in symptoms or other concerns.  It was a pleasure caring for you today.

## 2023-02-08 NOTE — ED Triage Notes (Signed)
Pt here after a fall this morning. Pt also had a fall yesterday and hit her head. Pt states she felt ok but she does not remember what happened. Pt states she is feeling lightheaded at the moment. Pt c/o neck and left foot pain.

## 2023-02-15 ENCOUNTER — Telehealth: Payer: Self-pay

## 2023-02-15 NOTE — Telephone Encounter (Signed)
Transition Care Management Unsuccessful Follow-up Telephone Call  Date of discharge and from where:  02/08/2023 [facility]  Attempts:  1st Attempt  Reason for unsuccessful TCM follow-up call:  Voice mail full   [facility]  [facility] Population Health Community Resource Care Guide   [email or web address]  ?? [phone]   Website: [email or web address]

## 2023-02-15 NOTE — Telephone Encounter (Signed)
Transition Care Management Follow-up Telephone Call Date of discharge and from where: 02/08/2023 [facility] How have you been since you were released from the hospital? Patient is feeling a little better. Any questions or concerns? No  Items Reviewed: Did the pt receive and understand the discharge instructions provided? Yes  Medications obtained and verified?  No medication prescribed Other? No  Any new allergies since your discharge? No  Dietary orders reviewed? Yes Do you have support at home? Yes   Follow up appointments reviewed:  PCP Hospital f/u appt confirmed? Yes  Scheduled to see Demetrios Isaacs. Sherrie Mustache, MD on 02/26/2023 @ [facility]. Specialist Hospital f/u appt confirmed? Yes  Scheduled to see Alecia Lemming, [name] on 02/16/2023 @ [facility]. Are transportation arrangements needed? No  If their condition worsens, is the pt aware to call PCP or go to the Emergency Dept.? Yes Was the patient provided with contact information for the PCP's office or ED? Yes Was to pt encouraged to call back with questions or concerns? Yes   [facility]  [facility] Population Health Community Resource Care Guide   [email or web address]  ?? [phone]   Website: [email or web address]  [email or web address]

## 2023-02-24 ENCOUNTER — Encounter: Payer: 59 | Admitting: Family Medicine

## 2023-02-26 ENCOUNTER — Encounter: Payer: 59 | Admitting: Family Medicine

## 2023-03-04 ENCOUNTER — Telehealth: Payer: Self-pay

## 2023-03-04 DIAGNOSIS — M25561 Pain in right knee: Secondary | ICD-10-CM | POA: Diagnosis not present

## 2023-03-04 DIAGNOSIS — M25562 Pain in left knee: Secondary | ICD-10-CM | POA: Diagnosis not present

## 2023-03-04 DIAGNOSIS — Z5181 Encounter for therapeutic drug level monitoring: Secondary | ICD-10-CM | POA: Diagnosis not present

## 2023-03-04 DIAGNOSIS — Z79891 Long term (current) use of opiate analgesic: Secondary | ICD-10-CM | POA: Diagnosis not present

## 2023-03-04 DIAGNOSIS — M5459 Other low back pain: Secondary | ICD-10-CM | POA: Diagnosis not present

## 2023-03-04 DIAGNOSIS — G894 Chronic pain syndrome: Secondary | ICD-10-CM | POA: Diagnosis not present

## 2023-03-04 NOTE — Telephone Encounter (Signed)
-----   Message from Naaman Plummer sent at 03/04/2023  3:48 PM EDT ----- From Chart she sees or has seen Dr. Hetty Ely here in town for multiple spine interventions - last seen in March. Try to contact the referring doc and or patient to see why not seeing him. Dr. Hetty Ely as done all that we would do.  If her knees are issue can see one of the ortho docs here or Dr. Shon Baton. ----- Message ----- From: Sharlet Salina, CMA Sent: 03/04/2023   3:31 PM EDT To: Tyrell Antonio, MD; Juanda Chance, NP   ----- Message ----- From: Mendel Corning Sent: 03/04/2023   3:17 PM EDT To: Sharlet Salina, CMA  Per office this referral for [facility] for an injection ----- Message ----- From: Dorcas Mcmurray Sent: 03/04/2023   2:01 PM EDT To: Mendel Corning

## 2023-03-04 NOTE — Telephone Encounter (Signed)
LVM for referral coordinator to call me back to inform of information below

## 2023-03-10 ENCOUNTER — Telehealth: Payer: Self-pay

## 2023-03-10 NOTE — Telephone Encounter (Signed)
-----   Message from Naaman Plummer sent at 03/04/2023  3:48 PM EDT ----- From Chart she sees or has seen Dr. Hetty Ely here in town for multiple spine interventions - last seen in March. Try to contact the referring doc and or patient to see why not seeing him. Dr. Hetty Ely as done all that we would do.  If her knees are issue can see one of the ortho docs here or Dr. Shon Baton. ----- Message ----- From: Sharlet Salina, CMA Sent: 03/04/2023   3:31 PM EDT To: Tyrell Antonio, MD; Juanda Chance, NP   ----- Message ----- From: Mendel Corning Sent: 03/04/2023   3:17 PM EDT To: Sharlet Salina, CMA  Per office this referral for [facility] for an injection ----- Message ----- From: Dorcas Mcmurray Sent: 03/04/2023   2:01 PM EDT To: Mendel Corning

## 2023-03-10 NOTE — Telephone Encounter (Signed)
Left detailed voicemail of information from Dr. Alvester Morin

## 2023-03-11 DIAGNOSIS — C184 Malignant neoplasm of transverse colon: Secondary | ICD-10-CM

## 2023-03-12 ENCOUNTER — Other Ambulatory Visit: Payer: Self-pay | Admitting: Family Medicine

## 2023-03-12 DIAGNOSIS — F5101 Primary insomnia: Secondary | ICD-10-CM

## 2023-03-12 DIAGNOSIS — Z79891 Long term (current) use of opiate analgesic: Secondary | ICD-10-CM | POA: Diagnosis not present

## 2023-03-12 DIAGNOSIS — G894 Chronic pain syndrome: Secondary | ICD-10-CM | POA: Diagnosis not present

## 2023-03-12 DIAGNOSIS — Z5181 Encounter for therapeutic drug level monitoring: Secondary | ICD-10-CM | POA: Diagnosis not present

## 2023-03-12 DIAGNOSIS — M5459 Other low back pain: Secondary | ICD-10-CM | POA: Diagnosis not present

## 2023-03-25 DIAGNOSIS — M5136 Other intervertebral disc degeneration, lumbar region: Secondary | ICD-10-CM | POA: Diagnosis not present

## 2023-03-25 DIAGNOSIS — M47816 Spondylosis without myelopathy or radiculopathy, lumbar region: Secondary | ICD-10-CM | POA: Diagnosis not present

## 2023-03-25 DIAGNOSIS — M4316 Spondylolisthesis, lumbar region: Secondary | ICD-10-CM | POA: Diagnosis not present

## 2023-03-25 DIAGNOSIS — M48061 Spinal stenosis, lumbar region without neurogenic claudication: Secondary | ICD-10-CM | POA: Diagnosis not present

## 2023-03-25 DIAGNOSIS — M47817 Spondylosis without myelopathy or radiculopathy, lumbosacral region: Secondary | ICD-10-CM | POA: Diagnosis not present

## 2023-03-25 DIAGNOSIS — M170 Bilateral primary osteoarthritis of knee: Secondary | ICD-10-CM | POA: Diagnosis not present

## 2023-03-30 ENCOUNTER — Other Ambulatory Visit: Payer: Self-pay | Admitting: Family Medicine

## 2023-03-30 DIAGNOSIS — R21 Rash and other nonspecific skin eruption: Secondary | ICD-10-CM

## 2023-03-30 DIAGNOSIS — J309 Allergic rhinitis, unspecified: Secondary | ICD-10-CM

## 2023-04-01 DIAGNOSIS — M25562 Pain in left knee: Secondary | ICD-10-CM | POA: Diagnosis not present

## 2023-04-01 DIAGNOSIS — M5459 Other low back pain: Secondary | ICD-10-CM | POA: Diagnosis not present

## 2023-04-01 DIAGNOSIS — Z5181 Encounter for therapeutic drug level monitoring: Secondary | ICD-10-CM | POA: Diagnosis not present

## 2023-04-01 DIAGNOSIS — G894 Chronic pain syndrome: Secondary | ICD-10-CM | POA: Diagnosis not present

## 2023-04-01 DIAGNOSIS — M25561 Pain in right knee: Secondary | ICD-10-CM | POA: Diagnosis not present

## 2023-04-01 DIAGNOSIS — Z79891 Long term (current) use of opiate analgesic: Secondary | ICD-10-CM | POA: Diagnosis not present

## 2023-04-12 ENCOUNTER — Other Ambulatory Visit: Payer: Self-pay

## 2023-04-12 DIAGNOSIS — C184 Malignant neoplasm of transverse colon: Secondary | ICD-10-CM

## 2023-04-13 ENCOUNTER — Inpatient Hospital Stay: Payer: 59 | Attending: Oncology

## 2023-04-13 ENCOUNTER — Ambulatory Visit: Admission: RE | Admit: 2023-04-13 | Payer: 59 | Source: Ambulatory Visit

## 2023-04-16 ENCOUNTER — Inpatient Hospital Stay: Payer: 59

## 2023-04-16 ENCOUNTER — Inpatient Hospital Stay: Payer: 59 | Admitting: Oncology

## 2023-04-21 ENCOUNTER — Telehealth: Payer: Self-pay | Admitting: Family Medicine

## 2023-04-21 NOTE — Telephone Encounter (Signed)
Carrie Alvarez with [facility] is calling in because she faxed over a lab requisition form for Dr. Sherrie Mustache to review and sign. Carrie Alvarez says if the form wasn't received to contact her so she can re-fax the form.

## 2023-04-21 NOTE — Telephone Encounter (Signed)
Never heard of [facility]. I got a request that a UTI test be mailed to the patient, but have no clinical information to justify the order. These unsolicited requests for orders are usually Medicare scams, so I am not signing it unless the patient calls and gives a clinical justification for it.

## 2023-04-29 DIAGNOSIS — Z5181 Encounter for therapeutic drug level monitoring: Secondary | ICD-10-CM | POA: Diagnosis not present

## 2023-04-29 DIAGNOSIS — G894 Chronic pain syndrome: Secondary | ICD-10-CM | POA: Diagnosis not present

## 2023-04-29 DIAGNOSIS — M51362 Other intervertebral disc degeneration, lumbar region with discogenic back pain and lower extremity pain: Secondary | ICD-10-CM | POA: Diagnosis not present

## 2023-04-29 DIAGNOSIS — Z79891 Long term (current) use of opiate analgesic: Secondary | ICD-10-CM | POA: Diagnosis not present

## 2023-04-29 DIAGNOSIS — M25561 Pain in right knee: Secondary | ICD-10-CM | POA: Diagnosis not present

## 2023-04-29 DIAGNOSIS — M5459 Other low back pain: Secondary | ICD-10-CM | POA: Diagnosis not present

## 2023-04-29 DIAGNOSIS — M25562 Pain in left knee: Secondary | ICD-10-CM | POA: Diagnosis not present

## 2023-05-11 ENCOUNTER — Ambulatory Visit
Admission: RE | Admit: 2023-05-11 | Discharge: 2023-05-11 | Disposition: A | Discharge status: Home / Self Care | Payer: 59 | Source: Ambulatory Visit | Attending: Oncology | Admitting: Oncology

## 2023-05-11 DIAGNOSIS — I700 Atherosclerosis of aorta: Secondary | ICD-10-CM | POA: Diagnosis not present

## 2023-05-11 DIAGNOSIS — I7121 Aneurysm of the ascending aorta, without rupture: Secondary | ICD-10-CM | POA: Diagnosis not present

## 2023-05-11 DIAGNOSIS — C184 Malignant neoplasm of transverse colon: Secondary | ICD-10-CM | POA: Insufficient documentation

## 2023-05-11 DIAGNOSIS — Z85038 Personal history of other malignant neoplasm of large intestine: Secondary | ICD-10-CM | POA: Diagnosis not present

## 2023-05-11 LAB — POCT I-STAT CREATININE: Creatinine, Ser: 1.2 mg/dL — ABNORMAL HIGH (ref 0.44–1.00)

## 2023-05-11 MED ORDER — IOHEXOL 300 MG/ML  SOLN
100.0000 mL | Freq: Once | INTRAMUSCULAR | Status: AC | PRN
  Administered 2023-05-11: 100 mL via INTRAVENOUS

## 2023-05-11 MED ORDER — BARIUM SULFATE 2 % PO SUSP: 450.0000 mL | ORAL | Status: AC

## 2023-05-21 ENCOUNTER — Inpatient Hospital Stay: Payer: 59 | Attending: Oncology

## 2023-05-21 ENCOUNTER — Encounter: Payer: Self-pay | Admitting: Oncology

## 2023-05-21 ENCOUNTER — Inpatient Hospital Stay: Payer: 59 | Admitting: Oncology

## 2023-05-21 ENCOUNTER — Other Ambulatory Visit: Payer: Self-pay | Admitting: *Deleted

## 2023-05-21 VITALS — BP 92/72 | HR 86 | Temp 98.1°F | Resp 18 | Ht 63.0 in | Wt 204.9 lb

## 2023-05-21 DIAGNOSIS — G629 Polyneuropathy, unspecified: Secondary | ICD-10-CM | POA: Insufficient documentation

## 2023-05-21 DIAGNOSIS — C184 Malignant neoplasm of transverse colon: Secondary | ICD-10-CM

## 2023-05-21 DIAGNOSIS — R600 Localized edema: Secondary | ICD-10-CM | POA: Insufficient documentation

## 2023-05-21 DIAGNOSIS — Z1589 Genetic susceptibility to other disease: Secondary | ICD-10-CM | POA: Insufficient documentation

## 2023-05-21 DIAGNOSIS — Z79899 Other long term (current) drug therapy: Secondary | ICD-10-CM | POA: Insufficient documentation

## 2023-05-21 DIAGNOSIS — Z853 Personal history of malignant neoplasm of breast: Secondary | ICD-10-CM | POA: Insufficient documentation

## 2023-05-21 DIAGNOSIS — Z1509 Genetic susceptibility to other malignant neoplasm: Secondary | ICD-10-CM | POA: Diagnosis not present

## 2023-05-21 DIAGNOSIS — Z8542 Personal history of malignant neoplasm of other parts of uterus: Secondary | ICD-10-CM | POA: Insufficient documentation

## 2023-05-21 DIAGNOSIS — Z006 Encounter for examination for normal comparison and control in clinical research program: Secondary | ICD-10-CM | POA: Diagnosis not present

## 2023-05-21 DIAGNOSIS — C189 Malignant neoplasm of colon, unspecified: Secondary | ICD-10-CM | POA: Insufficient documentation

## 2023-05-21 DIAGNOSIS — Z1231 Encounter for screening mammogram for malignant neoplasm of breast: Secondary | ICD-10-CM

## 2023-05-21 DIAGNOSIS — Z1211 Encounter for screening for malignant neoplasm of colon: Secondary | ICD-10-CM

## 2023-05-21 LAB — CBC WITH DIFFERENTIAL (CANCER CENTER ONLY)
Abs Immature Granulocytes: 0.03 10*3/uL (ref 0.00–0.07)
Basophils Absolute: 0.1 10*3/uL (ref 0.0–0.1)
Basophils Relative: 1 %
Eosinophils Absolute: 0.3 10*3/uL (ref 0.0–0.5)
Eosinophils Relative: 3 %
HCT: 33.9 % — ABNORMAL LOW (ref 36.0–46.0)
Hemoglobin: 11.5 g/dL — ABNORMAL LOW (ref 12.0–15.0)
Immature Granulocytes: 0 %
Lymphocytes Relative: 26 %
Lymphs Abs: 2 10*3/uL (ref 0.7–4.0)
MCH: 28.3 pg (ref 26.0–34.0)
MCHC: 33.9 g/dL (ref 30.0–36.0)
MCV: 83.5 fL (ref 80.0–100.0)
Monocytes Absolute: 0.5 10*3/uL (ref 0.1–1.0)
Monocytes Relative: 7 %
Neutro Abs: 4.9 10*3/uL (ref 1.7–7.7)
Neutrophils Relative %: 63 %
Platelet Count: 187 10*3/uL (ref 150–400)
RBC: 4.06 MIL/uL (ref 3.87–5.11)
RDW: 14.5 % (ref 11.5–15.5)
WBC Count: 7.7 10*3/uL (ref 4.0–10.5)
nRBC: 0 % (ref 0.0–0.2)

## 2023-05-21 LAB — CMP (CANCER CENTER ONLY)
ALT: 15 U/L (ref 0–44)
AST: 19 U/L (ref 15–41)
Albumin: 3.9 g/dL (ref 3.5–5.0)
Alkaline Phosphatase: 83 U/L (ref 38–126)
Anion gap: 6 (ref 5–15)
BUN: 16 mg/dL (ref 8–23)
CO2: 25 mmol/L (ref 22–32)
Calcium: 9.1 mg/dL (ref 8.9–10.3)
Chloride: 107 mmol/L (ref 98–111)
Creatinine: 1.35 mg/dL — ABNORMAL HIGH (ref 0.44–1.00)
GFR, Estimated: 42 mL/min — ABNORMAL LOW (ref >60)
Glucose, Bld: 111 mg/dL — ABNORMAL HIGH (ref 70–99)
Potassium: 3.5 mmol/L (ref 3.5–5.1)
Sodium: 138 mmol/L (ref 135–145)
Total Bilirubin: 0.6 mg/dL (ref <1.2)
Total Protein: 6.8 g/dL (ref 6.5–8.1)

## 2023-05-21 NOTE — Research (Signed)
"  Randomized Trial of Standard Chemotherapy Alone or Combined with Atezolizumab as Adjuvant Therapy for Stage III Colon Cancer and Deficient DNA Mismatch Repair"   [name] 48 Month Follow Up:   Patient in to the cancer center unaccompanied for her 48 month follow up visit for [id number] "[name]" protocol. Dr. Orlie Dakin examined patient today and reviewed her mammogram and CT scan results today. Patient informed by Dr. Orlie Dakin that everything looks really good from an oncology point of view currently. Her CT scan was negative for any recurrence and her labs looked good today, still waiting for the CEA. Dr. Orlie Dakin requested she have a colonoscopy next year due to her diagnosis of Lynch Syndrome. The patient states she will do that, Dr. Orlie Dakin to send message to her GI provider to schedule that for her. Dr. Orlie Dakin discussed having her 3 children tested for Lynch Syndrome and provided education for the patient regarding the genetic hereditary component, testing for the syndrome, and future testing if results are positive. Patient stated she would talk with her children and encourage them to do the testing.   Patients only complaint today is continued neuropathy in her hands and feet. She states it seems to be getting worse to her-Grade 2 due to issues with walking and using her hands sometimes.  Dr. Orlie Dakin suggests patient see a neurologist to have this evaluated as her chemotherapy completed a while ago. Patient is in agreement with his plan, Dr. Orlie Dakin will make a referral for her.  Medications were reviewed with the patient, there were no changes. The research schedule was reviewed, patient was informed that she won't need a CT scan for her next visit. CT will be scheduled for her 5 year follow up with Dr. Orlie Dakin. Explained that after the 5 year visit she will not have to come in the office or have any further scans unless needed. Research nurse will call her every 6 months to see how she is doing until  she completes the study at 8 years.  She will be scheduled for every 6 month follow up until that time. Patient was in agreement with all plans moving forward for research today. Patient was encouraged to call if she has any questions or concerns.    Chriss Driver, RN 05/21/23 11:19 AM

## 2023-05-21 NOTE — Progress Notes (Signed)
[facility]  Telephone:([phone] Fax:(336) [phone]  ID: Lewis Moccasin OB: 06/19/1951  MR#: [id number]  QIO#:[id number]  Patient Care Team: Malva Limes, MD as PCP - General (Family Medicine) Jeralyn Ruths, MD as Consulting Physician (Oncology) Ewing Schlein, MD as Attending Physician (Pain Medicine) Irene Limbo., MD as Consulting Physician (Ophthalmology) Benita Gutter, RN as Oncology Nurse Navigator [name], Corliss Parish, MD as Referring Physician (Anesthesiology)  CHIEF COMPLAINT: Stage IIa ER+ left breast cancer with no breast lesion and axillary lymph node metastasis.  Now with stage IIIa colon cancer.  INTERVAL HISTORY: Patient returns to clinic today for routine 63-month evaluation, repeat laboratory work, and discussion of her imaging results.  She feels her peripheral neuropathy is getting worse, but otherwise feels well.  She has no other neurologic complaints.  She does not complain of any weakness or fatigue today. She does not complain of back pain today.  She denies any fevers.  She has no chest pain, shortness of breath, cough, or hemoptysis.  She denies any nausea, vomiting, constipation, or diarrhea.  She has no melena or hematochezia.  She has no urinary complaints.  Patient offers no further specific complaints today.  REVIEW OF SYSTEMS:   Review of Systems  Constitutional: Negative.  Negative for fever, malaise/fatigue and weight loss.  Respiratory: Negative.  Negative for cough and shortness of breath.   Cardiovascular: Negative.  Negative for chest pain and leg swelling.  Gastrointestinal: Negative.  Negative for abdominal pain, blood in stool, constipation, diarrhea, melena, nausea and vomiting.  Genitourinary: Negative.  Negative for dysuria.  Musculoskeletal: Negative.  Negative for back pain, falls and neck pain.  Skin: Negative.  Negative for rash.  Neurological:  Positive for tingling and sensory change.  Negative for dizziness, focal weakness, weakness and headaches.  Psychiatric/Behavioral: Negative.  Negative for depression. The patient is not nervous/anxious.    As per HPI. Otherwise, a complete review of systems is negative.  PAST MEDICAL HISTORY: Past Medical History:  Diagnosis Date   Allergy    Anemia    Arthritis    Back pain    Breast cancer (HCC) 2015   LT LUMPECTOMY 12-2014 FOLLOWING CHEMO   Carpal tunnel syndrome    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   Cataract    Chronic pain    Colon cancer (HCC) 01/2019   Colon cancer (HCC) 2020   Depression    Dyspnea    Family history of breast cancer    Family history of colon cancer    GERD (gastroesophageal reflux disease)    problem with reflux during chemo   History of methicillin resistant staphylococcus aureus (MRSA)    Hypercholesteremia    Hypertension    Lumbosacral pain    sees pain management in [place]   Obesity    Personal history of chemotherapy 2016   left breast ca   Personal history of chemotherapy 2020   colon ca   Pinched nerve    left side, lumbar area   Uterine cancer (HCC) 1994    PAST SURGICAL HISTORY: Past Surgical History:  Procedure Laterality Date   ABDOMINAL HYSTERECTOMY  1994   UTERINE CA   AXILLARY LYMPH NODE BIOPSY Left 12/18/2014   Procedure: AXILLARY LYMPH NODE BIOPSY/;  Surgeon: Earline Mayotte, MD;  Location: [facility];  Service: General;  Laterality: Left;   AXILLARY LYMPH NODE DISSECTION Left 12/18/2014   Procedure: AXILLARY LYMPH NODE DISSECTION;  Surgeon: Earline Mayotte, MD;  Location: [facility];  Service: General;  Laterality: Left;   BREAST BIOPSY Left 05/2014   CORE BX OF LN, METASTATIC ADENOCARCINOMA   BREAST LUMPECTOMY Left 12/2014   CHEMO FIRST THEN SURGERY OF LN   BREAST SURGERY     lymph node removal   CATARACT EXTRACTION W/PHACO Left 11/26/2020   Procedure: CATARACT EXTRACTION PHACO AND INTRAOCULAR LENS PLACEMENT (IOC) LEFT;  Surgeon: Galen Manila, MD;   Location: [facility];  Service: Ophthalmology;  Laterality: Left;  6.40 00:43.9   CATARACT EXTRACTION W/PHACO Right 12/10/2020   Procedure: CATARACT EXTRACTION PHACO AND INTRAOCULAR LENS PLACEMENT (IOC) RIGHT;  Surgeon: Galen Manila, MD;  Location: [facility];  Service: Ophthalmology;  Laterality: Right;  7.59 0:47.4   CHOLECYSTECTOMY N/A 04/19/2016   Procedure: LAPAROSCOPIC CHOLECYSTECTOMY WITH INTRAOPERATIVE CHOLANGIOGRAM;  Surgeon: Gladis Riffle, MD;  Location: [facility];  Service: General;  Laterality: N/A;   COLON RESECTION Right 01/10/2019   Procedure: HAND ASSISTED LAPAROSCOPIC RIGHT COLON RESECTION;  Surgeon: Leafy Ro, MD;  Location: [facility];  Service: General;  Laterality: Right;   COLONOSCOPY WITH PROPOFOL N/A 12/30/2018   Procedure: COLONOSCOPY WITH PROPOFOL;  Surgeon: Midge Minium, MD;  Location: [facility];  Service: Endoscopy;  Laterality: N/A;   COLONOSCOPY WITH PROPOFOL N/A 03/05/2020   Procedure: COLONOSCOPY WITH PROPOFOL;  Surgeon: Midge Minium, MD;  Location: [facility];  Service: Endoscopy;  Laterality: N/A;   COLONOSCOPY WITH PROPOFOL N/A 05/12/2022   Procedure: COLONOSCOPY WITH PROPOFOL;  Surgeon: Midge Minium, MD;  Location: [facility];  Service: Endoscopy;  Laterality: N/A;   medial branch block  11/06/2015   lumbar facet Dr. Metta Clines   POLYPECTOMY N/A 12/30/2018   Procedure: POLYPECTOMY;  Surgeon: Midge Minium, MD;  Location: [facility];  Service: Endoscopy;  Laterality: N/A;  Clips placed at Hepatic Flexure Polyp (2) and Transverse Colon Polyp (4) removal sites   PORTACATH PLACEMENT Right 02/02/2019   Procedure: INSERTION PORT-A-CATH;  Surgeon: Leafy Ro, MD;  Location: [facility];  Service: General;  Laterality: Right;   RADIOFREQUENCY ABLATION     lumbar   SENTINEL NODE BIOPSY Left 12/18/2014   Procedure: SENTINEL NODE BIOPSY;  Surgeon: Earline Mayotte, MD;  Location: [facility];  Service: General;  Laterality:  Left;    FAMILY HISTORY Family History  Problem Relation Age of Onset   Breast cancer Sister 49   Colon cancer Sister    Colon cancer Mother        dx 35s   Hypertension Mother    Asthma Mother    Cancer Father        voice box removed   Cancer Maternal Grandmother        unsure type   Colon cancer Cousin    Cancer Cousin        unsure type       ADVANCED DIRECTIVES:    HEALTH MAINTENANCE: Social History   Tobacco Use   Smoking status: Former    Current packs/day: 0.00    Types: Cigarettes    Quit date: 07/18/2014    Years since quitting: 8.8   Smokeless tobacco: Never  Vaping Use   Vaping status: Never Used  Substance Use Topics   Alcohol use: No    Alcohol/week: 0.0 standard drinks of alcohol   Drug use: No    No Known Allergies  Current Outpatient Medications  Medication Sig Dispense Refill   albuterol (VENTOLIN HFA) 108 (90 Base) MCG/ACT inhaler INHALE 2 PUFFS BY MOUTH  EVERY 6 HOURS AS NEEDED FOR WHEEZING AND FOR SHORTNESS OF BREATH 9 g 0   amLODipine (NORVASC) 5 MG tablet Take 1 tablet (5 mg total) by mouth daily. 90 tablet 3   atorvastatin (LIPITOR) 80 MG tablet Take 1 tablet (80 mg total) by mouth daily. 90 tablet 3   baclofen (LIORESAL) 10 MG tablet Take 1 tablet (10 mg total) by mouth 2 (two) times daily. 30 each 0   cetirizine (ZYRTEC) 10 MG tablet Take 10 mg by mouth daily at 6 (six) AM.     cholecalciferol (VITAMIN D) 1000 units tablet Take 1,000 Units by mouth daily.     fluticasone (FLONASE) 50 MCG/ACT nasal spray USE 2 SPRAY(S) IN EACH NOSTRIL ONCE DAILY 48 mL 1   gabapentin (NEURONTIN) 300 MG capsule Take 300-600 mg by mouth 4 (four) times daily as needed (neuropathic pain).      NARCAN 4 MG/0.1ML LIQD nasal spray kit Place 1 spray into the nose.      Oxycodone HCl 10 MG TABS LIMIT ONE HALF TO ONE TABLET BY MOUTH 3 TO 5 TIMES DAILY IF TOLERATED     PREVIDENT 5000 DRY MOUTH 1.1 % GEL dental gel Place onto teeth at bedtime.     pyridoxine (B-6)  100 MG tablet Take 100 mg by mouth daily.     sertraline (ZOLOFT) 100 MG tablet Take 2 tablets by mouth once daily 180 tablet 3   traZODone (DESYREL) 50 MG tablet TAKE 1 TO 2 TABLETS BY MOUTH AT BEDTIME AS NEEDED FOR SLEEP 180 tablet 2   vitamin B-12 (CYANOCOBALAMIN) 500 MCG tablet Take 500 mcg by mouth daily.     acetaminophen (TYLENOL) 500 MG tablet Take 500 mg by mouth every 6 (six) hours as needed for mild pain or moderate pain.  (Patient not taking: Reported on 05/21/2023)     benzonatate (TESSALON) 100 MG capsule Take 1 capsule (100 mg total) by mouth 2 (two) times daily as needed for cough. (Patient not taking: Reported on 01/13/2023) 20 capsule 0   furosemide (LASIX) 20 MG tablet Take 1 tablet (20 mg total) by mouth daily as needed for fluid. (Patient not taking: Reported on 05/21/2023) 20 tablet 1   letrozole (FEMARA) 2.5 MG tablet Take 2.5 mg by mouth daily. (Patient not taking: Reported on 05/21/2023)     predniSONE (DELTASONE) 20 MG tablet Take 1 tablet (20 mg total) by mouth daily with breakfast. (Patient not taking: Reported on 01/13/2023) 5 tablet 0   No current facility-administered medications for this visit.   Facility-Administered Medications Ordered in Other Visits  Medication Dose Route Frequency Provider Last Rate Last Admin   heparin lock flush 100 unit/mL  500 Units Intravenous Once Jeralyn Ruths, MD       heparin lock flush 100 unit/mL  500 Units Intravenous Once Jeralyn Ruths, MD       sodium chloride flush (NS) 0.9 % injection 10 mL  10 mL Intravenous PRN Jeralyn Ruths, MD   10 mL at 02/27/19 0925    OBJECTIVE: Vitals:   05/21/23 1025  BP: 92/72  Pulse: 86  Resp: 18  Temp: 98.1 F (36.7 C)  SpO2: 100%      Body mass index is 36.3 kg/m.    ECOG FS:1 - Symptomatic but completely ambulatory  General: Well-developed, well-nourished, no acute distress. Eyes: Pink conjunctiva, anicteric sclera. HEENT: Normocephalic, moist mucous membranes. Lungs: No  audible wheezing or coughing. Heart: Regular rate and rhythm. Abdomen: Soft, nontender,  no obvious distention. Musculoskeletal: No edema, cyanosis, or clubbing. Neuro: Alert, answering all questions appropriately. Cranial nerves grossly intact. Skin: No rashes or petechiae noted. Psych: Normal affect.  LAB RESULTS:  Lab Results  Component Value Date   NA 138 05/21/2023   K 3.5 05/21/2023   CL 107 05/21/2023   CO2 25 05/21/2023   GLUCOSE 111 (H) 05/21/2023   BUN 16 05/21/2023   CREATININE 1.35 (H) 05/21/2023   CALCIUM 9.1 05/21/2023   PROT 6.8 05/21/2023   ALBUMIN 3.9 05/21/2023   AST 19 05/21/2023   ALT 15 05/21/2023   ALKPHOS 83 05/21/2023   BILITOT 0.6 05/21/2023   GFRNONAA 42 (L) 05/21/2023   GFRAA 57 (L) 02/26/2020    Lab Results  Component Value Date   WBC 7.7 05/21/2023   NEUTROABS 4.9 05/21/2023   HGB 11.5 (L) 05/21/2023   HCT 33.9 (L) 05/21/2023   MCV 83.5 05/21/2023   PLT 187 05/21/2023     STUDIES: CT CHEST ABDOMEN PELVIS W CONTRAST  Result Date: 05/16/2023 CLINICAL DATA:  History of colon cancer, restaging. * Tracking Code: BO * EXAM: CT CHEST, ABDOMEN, AND PELVIS WITH CONTRAST TECHNIQUE: Multidetector CT imaging of the chest, abdomen and pelvis was performed following the standard protocol during bolus administration of intravenous contrast. RADIATION DOSE REDUCTION: This exam was performed according to the departmental dose-optimization program which includes automated exposure control, adjustment of the mA and/or kV according to patient size and/or use of iterative reconstruction technique. CONTRAST:  OMNIPAQUE IOHEXOL 300 MG/ML  SOLN COMPARISON:  Multiple priors including most recent CT October 08, 2022 FINDINGS: CT CHEST FINDINGS Cardiovascular: Aortic atherosclerosis. Aneurysmal dilation of the ascending thoracic aorta measures 4.2 cm. Tortuous thoracic aorta. Gas in the pulmonary outflow tract reflects sequela of vascular access. Normal size heart. No  significant pericardial effusion/thickening. Mediastinum/Nodes: No suspicious thyroid nodule. No pathologically enlarged mediastinal, hilar or axillary lymph nodes. The esophagus is grossly unremarkable. Lungs/Pleura: Bibasilar scarring versus atelectasis. Calcified nodularity in the anterior left upper lobe on image 52/4 favored sequela of prior granulomatous disease. Chronically stable 2 mm pulmonary nodule in the lingula on image 90/4. No new suspicious pulmonary nodules or masses. Musculoskeletal: No aggressive lytic or blastic lesion of bone. CT ABDOMEN PELVIS FINDINGS Hepatobiliary: Ill-defined hypodensity along the falciform ligament likely reflects focal fatty sparing or differential perfusion. Gallbladder surgically absent. Similar prominence of the extrahepatic pancreatic duct favored reservoir effect post cholecystectomy. Pancreas: No pancreatic ductal dilation or evidence of acute inflammation. Spleen: No splenomegaly or focal splenic lesion. Adrenals/Urinary Tract: Bilateral adrenal glands appear normal. No hydronephrosis. Kidneys demonstrate symmetric enhancement. Urinary bladder is unremarkable for degree of distension. Stomach/Bowel: Radiopaque enteric contrast material traverses the rectum. Stomach is unremarkable for degree of distension. No pathologic dilation of small or large bowel. No evidence of acute bowel inflammation. Prior right hemicolectomy with ileocolic anastomosis in the right upper quadrant. No new suspicious nodularity along the anastomotic sutures. Vascular/Lymphatic: Aortic atherosclerosis. Normal caliber abdominal aorta. Smooth IVC contours. The portal, splenic and superior mesenteric veins are patent. No pathologically enlarged abdominal or pelvic lymph nodes. Reproductive: Status post hysterectomy. No adnexal masses. Other: No significant abdominopelvic free fluid. Musculoskeletal: No aggressive lytic or blastic lesion of bone. Multilevel degenerative changes spine. Similar  grade 1 L4 on L5 anterolisthesis. IMPRESSION: 1. Stable examination status post right hemicolectomy without evidence of local recurrence or metastatic disease in the chest, abdomen or pelvis. 2. Similar aneurysmal dilation of the ascending thoracic aorta measures 4.2 cm. Suggest continued  attention on follow-up oncologic imaging. 3.  Aortic Atherosclerosis (ICD10-I70.0). Electronically Signed   By: Maudry Mayhew M.D.   On: 05/16/2023 13:25     ASSESSMENT: Stage IIa ER+ left breast cancer with no breast lesion and axillary lymph node metastasis. (TxN1M0)  HER-2 negative.  Now with stage IIIa colon cancer.  PLAN:   Stage IIIa colon cancer: Pathology report reviewed independently.  Previously, patient agreed to enroll in clinical trial.  Oxaliplatin was discontinued early secondary to worsening neuropathy.  Patient completed 12 cycles of chemotherapy on July 31, 2019 and then completed maintenance Tecentriq on January 29, 2020.  Her most recent imaging on May 11, 2023 reviewed independently and reported as above with no obvious evidence of recurrent or progressive disease.  CEA continues to be within normal limits.  Today's result is pending.  Her most recent colonoscopy on May 12, 2022 only revealed 2 polyps.  Recommendation was repeat in 5 years, but given her underlying Lynch syndrome this likely will need to be repeated sooner.  Have sent a referral to GI for consideration of repeat colonoscopy in October 2025.  Per research protocol, patient will continue to have CT scan and CEA every 6 months for a total of 5 years completing follow-up in November 2025.  Return to clinic in 6 months with repeat laboratory work and evaluation only.  Patient's next imaging will occur in November 2025.  Stage IIa ER+ left breast cancer with no breast lesion and axillary lymph node metastasis: Despite no obvious breast lesion on mammogram or breast MRI, pathology and pattern of spread was consistent with primary  breast cancer. CT and bone scan at time of diagnosis revealed no other evidence of malignancy.  She only received 3 cycles of Adriamycin and Cytoxan. Taxol was discontinued early as well secondary to persistent peripheral neuropathy. Patient completed her chemotherapy on Nov 19, 2014.  She elected not to pursue axillary node dissection given the potential morbidity of this procedure. It was also elected not to pursue adjuvant XRT given there was no primary breast lesion.  Patient completed approximately 4 years of treatment with letrozole, but this was discontinued secondary to enrolling in clinical trial for her newly diagnosed colon cancer.  Her most recent mammogram in January 2024 was reported as BI-RADS 1.  Repeat in January 2025.   Bone health: Patient's most recent bone mineral density on February 03, 2018 reported T score of -0.7 which is unchanged from 3 years prior.  Continue monitoring and evaluation per primary care. Lynch syndrome: Genetic testing confirmed patient has Lynch syndrome.  Likely maternal patient reports her mother had colon cancer at a very young age.  She reports her sister and son both have tested positive.  She reports her other children are not interested in genetic testing.  Appreciate genetics input.  Continue yearly colonoscopies as above. Endometrial cancer: Patient has had a total hysterectomy. Back pain/sciatica: Patient does not complain of this today.  Patient previously reported her insurance will only allow 3 injections every 6 months.   Peripheral neuropathy: Patient reports this is slightly worse.  Continue gabapentin 600 mg 4 times per day.  Continue follow-up with pain clinic as scheduled.  She has agreed to a referral to neurology today.   Peripheral edema: Patient is complain of this today.  Continue Lasix as per primary care instructions.    Patient expressed understanding and was in agreement with this plan. She also understands that She can call clinic at any time  with any questions, concerns, or complaints.   Breast cancer metastasized to axillary lymph node   Staging form: Breast, AJCC 7th Edition     Clinical stage from 10/22/2014: Stage Unknown (TX, N1, M0) - Signed by Jeralyn Ruths, MD on 10/22/2014   Jeralyn Ruths, MD   05/21/2023 10:42 AM

## 2023-05-23 LAB — CEA: CEA: 4 ng/mL (ref 0.0–4.7)

## 2023-05-27 DIAGNOSIS — Z79891 Long term (current) use of opiate analgesic: Secondary | ICD-10-CM | POA: Diagnosis not present

## 2023-05-27 DIAGNOSIS — M25562 Pain in left knee: Secondary | ICD-10-CM | POA: Diagnosis not present

## 2023-05-27 DIAGNOSIS — F5101 Primary insomnia: Secondary | ICD-10-CM | POA: Diagnosis not present

## 2023-05-27 DIAGNOSIS — Z5181 Encounter for therapeutic drug level monitoring: Secondary | ICD-10-CM | POA: Diagnosis not present

## 2023-05-27 DIAGNOSIS — G894 Chronic pain syndrome: Secondary | ICD-10-CM | POA: Diagnosis not present

## 2023-05-27 DIAGNOSIS — M5459 Other low back pain: Secondary | ICD-10-CM | POA: Diagnosis not present

## 2023-05-27 DIAGNOSIS — M25561 Pain in right knee: Secondary | ICD-10-CM | POA: Diagnosis not present

## 2023-06-04 DIAGNOSIS — M5459 Other low back pain: Secondary | ICD-10-CM | POA: Diagnosis not present

## 2023-06-04 DIAGNOSIS — G894 Chronic pain syndrome: Secondary | ICD-10-CM | POA: Diagnosis not present

## 2023-06-04 DIAGNOSIS — Z79891 Long term (current) use of opiate analgesic: Secondary | ICD-10-CM | POA: Diagnosis not present

## 2023-06-04 DIAGNOSIS — Z5181 Encounter for therapeutic drug level monitoring: Secondary | ICD-10-CM | POA: Diagnosis not present

## 2023-06-24 DIAGNOSIS — M5459 Other low back pain: Secondary | ICD-10-CM | POA: Diagnosis not present

## 2023-06-24 DIAGNOSIS — G894 Chronic pain syndrome: Secondary | ICD-10-CM | POA: Diagnosis not present

## 2023-06-24 DIAGNOSIS — Z79891 Long term (current) use of opiate analgesic: Secondary | ICD-10-CM | POA: Diagnosis not present

## 2023-06-24 DIAGNOSIS — M25561 Pain in right knee: Secondary | ICD-10-CM | POA: Diagnosis not present

## 2023-06-24 DIAGNOSIS — F5101 Primary insomnia: Secondary | ICD-10-CM | POA: Diagnosis not present

## 2023-06-24 DIAGNOSIS — Z5181 Encounter for therapeutic drug level monitoring: Secondary | ICD-10-CM | POA: Diagnosis not present

## 2023-06-29 ENCOUNTER — Emergency Department: Payer: 59

## 2023-06-29 ENCOUNTER — Ambulatory Visit: Payer: Self-pay | Admitting: *Deleted

## 2023-06-29 ENCOUNTER — Emergency Department
Admission: EM | Admit: 2023-06-29 | Discharge: 2023-06-29 | Disposition: A | Discharge status: Home / Self Care | Payer: 59 | Attending: Emergency Medicine | Admitting: Emergency Medicine

## 2023-06-29 ENCOUNTER — Other Ambulatory Visit: Payer: Self-pay

## 2023-06-29 DIAGNOSIS — R197 Diarrhea, unspecified: Secondary | ICD-10-CM

## 2023-06-29 DIAGNOSIS — K429 Umbilical hernia without obstruction or gangrene: Secondary | ICD-10-CM | POA: Diagnosis not present

## 2023-06-29 DIAGNOSIS — R1084 Generalized abdominal pain: Secondary | ICD-10-CM | POA: Diagnosis not present

## 2023-06-29 DIAGNOSIS — R109 Unspecified abdominal pain: Secondary | ICD-10-CM | POA: Diagnosis present

## 2023-06-29 LAB — URINALYSIS, ROUTINE W REFLEX MICROSCOPIC
Bilirubin Urine: NEGATIVE
Glucose, UA: NEGATIVE mg/dL
Hgb urine dipstick: NEGATIVE
Ketones, ur: 5 mg/dL — AB
Leukocytes,Ua: NEGATIVE
Nitrite: NEGATIVE
Protein, ur: NEGATIVE mg/dL
Specific Gravity, Urine: 1.043 — ABNORMAL HIGH (ref 1.005–1.030)
pH: 6 (ref 5.0–8.0)

## 2023-06-29 LAB — COMPREHENSIVE METABOLIC PANEL
ALT: 16 U/L (ref 0–44)
AST: 21 U/L (ref 15–41)
Albumin: 4.3 g/dL (ref 3.5–5.0)
Alkaline Phosphatase: 63 U/L (ref 38–126)
Anion gap: 12 (ref 5–15)
BUN: 14 mg/dL (ref 8–23)
CO2: 23 mmol/L (ref 22–32)
Calcium: 10 mg/dL (ref 8.9–10.3)
Chloride: 105 mmol/L (ref 98–111)
Creatinine, Ser: 1.13 mg/dL — ABNORMAL HIGH (ref 0.44–1.00)
GFR, Estimated: 52 mL/min — ABNORMAL LOW (ref >60)
Glucose, Bld: 95 mg/dL (ref 70–99)
Potassium: 3.6 mmol/L (ref 3.5–5.1)
Sodium: 140 mmol/L (ref 135–145)
Total Bilirubin: 1.3 mg/dL — ABNORMAL HIGH (ref <1.2)
Total Protein: 7.8 g/dL (ref 6.5–8.1)

## 2023-06-29 LAB — CBC
HCT: 35.5 % — ABNORMAL LOW (ref 36.0–46.0)
Hemoglobin: 12.7 g/dL (ref 12.0–15.0)
MCH: 28.8 pg (ref 26.0–34.0)
MCHC: 35.8 g/dL (ref 30.0–36.0)
MCV: 80.5 fL (ref 80.0–100.0)
Platelets: 214 10*3/uL (ref 150–400)
RBC: 4.41 MIL/uL (ref 3.87–5.11)
RDW: 13.8 % (ref 11.5–15.5)
WBC: 8.6 10*3/uL (ref 4.0–10.5)
nRBC: 0 % (ref 0.0–0.2)

## 2023-06-29 LAB — LIPASE, BLOOD: Lipase: 27 U/L (ref 11–51)

## 2023-06-29 MED ORDER — MORPHINE SULFATE (PF) 2 MG/ML IV SOLN
2.0000 mg | Freq: Once | INTRAVENOUS | Status: AC
Start: 2023-06-29 — End: 2023-06-29
  Administered 2023-06-29: 2 mg via INTRAVENOUS
  Filled 2023-06-29: qty 1

## 2023-06-29 MED ORDER — IOHEXOL 300 MG/ML  SOLN
100.0000 mL | Freq: Once | INTRAMUSCULAR | Status: AC | PRN
  Administered 2023-06-29: 100 mL via INTRAVENOUS

## 2023-06-29 MED ORDER — SODIUM CHLORIDE 0.9 % IV BOLUS
1000.0000 mL | Freq: Once | INTRAVENOUS | Status: AC
  Administered 2023-06-29: 1000 mL via INTRAVENOUS

## 2023-06-29 MED ORDER — ONDANSETRON HCL 4 MG/2ML IJ SOLN
4.0000 mg | Freq: Once | INTRAMUSCULAR | Status: AC
  Administered 2023-06-29: 4 mg via INTRAVENOUS
  Filled 2023-06-29: qty 2

## 2023-06-29 MED ORDER — ONDANSETRON HCL 4 MG PO TABS: 4.0000 mg | ORAL_TABLET | Freq: Four times a day (QID) | ORAL | 0 refills | Status: AC | PRN

## 2023-06-29 MED ORDER — ONDANSETRON 4 MG PO TBDP: 4.0000 mg | ORAL_TABLET | Freq: Three times a day (TID) | ORAL | 0 refills | Status: DC | PRN

## 2023-06-29 NOTE — ED Provider Notes (Signed)
Patient received in signout from Dr. Rosalia Hammers pending follow-up imaging.  Patient remains nontoxic-appearing no acute distress.  Ambulating with steady gait.  CT imaging without evidence of acute abnormality.  She does appear stable and appropriate for outpatient follow-up.   Carrie Eddy, MD 06/29/23 [phone]

## 2023-06-29 NOTE — ED Provider Notes (Signed)
[facility] Provider Note    Event Date/Time   First MD Initiated Contact with Patient 06/29/23 1121     (approximate)   History   Nausea and Abdominal Pain   HPI  Carrie Alvarez is a 72 year old female presenting to the emergency department for evaluation of abdominal pain.  On Sunday, patient began to develop abdominal pain with frequent episodes of nonbloody diarrhea and nausea without vomiting.  No fevers or chills.  History of colon cancer few years ago, no active disease.     Physical Exam   Triage Vital Signs: ED Triage Vitals [06/29/23 0942]  Encounter Vitals Group     BP (!) 150/114     Systolic BP Percentile      Diastolic BP Percentile      Pulse Rate 78     Resp 17     Temp 98.5 F (36.9 C)     Temp Source Oral     SpO2 100 %     Weight 204 lb 12.9 oz (92.9 kg)     Height 5\' 3"  (1.6 m)     Head Circumference      Peak Flow      Pain Score 8     Pain Loc      Pain Education      Exclude from Growth Chart     Most recent vital signs: Vitals:   06/29/23 1330 06/29/23 1504  BP:  (!) 152/99  Pulse:  77  Resp:  18  Temp: 98.2 F (36.8 C)   SpO2:  99%     General: Awake, interactive  CV:  Regular rate, good peripheral perfusion.  Resp:  Unlabored respirations, lungs clear to auscultation Abd:  Soft, mild generalized tenderness without rebound or guarding, nondistended Neuro:  Symmetric facial movement, fluid speech   ED Results / Procedures / Treatments   Labs (all labs ordered are listed, but only abnormal results are displayed) Labs Reviewed  COMPREHENSIVE METABOLIC PANEL - Abnormal; Notable for the following components:      Result Value   Creatinine, Ser 1.13 (*)    Total Bilirubin 1.3 (*)    GFR, Estimated 52 (*)    All other components within normal limits  CBC - Abnormal; Notable for the following components:   HCT 35.5 (*)    All other components within normal limits  GASTROINTESTINAL PANEL BY  PCR, STOOL (REPLACES STOOL CULTURE)  C DIFFICILE QUICK SCREEN W PCR REFLEX    LIPASE, BLOOD  URINALYSIS, ROUTINE W REFLEX MICROSCOPIC     EKG EKG independently reviewed interpreted by myself (ER attending) demonstrates:    RADIOLOGY Imaging independently reviewed and interpreted by myself demonstrates:    PROCEDURES:  Critical Care performed: No  Procedures   MEDICATIONS ORDERED IN ED: Medications  ondansetron (ZOFRAN) injection 4 mg (4 mg Intravenous Given 06/29/23 1331)  sodium chloride 0.9 % bolus 1,000 mL (0 mLs Intravenous Stopped 06/29/23 1504)  morphine (PF) 2 MG/ML injection 2 mg (2 mg Intravenous Given 06/29/23 1333)  iohexol (OMNIPAQUE) 300 MG/ML solution 100 mL (100 mLs Intravenous Contrast Given 06/29/23 1402)     IMPRESSION / MDM / ASSESSMENT AND PLAN / ED COURSE  I reviewed the triage vital signs and the nursing notes.  Differential diagnosis includes, but is not limited to, colitis, diverticulitis, viral GI illness, appendicitis, other acute intra-abdominal process  Patient's presentation is most consistent with acute presentation with potential threat to life or bodily function.  72 year old  female presenting to the Emergency Department for evaluation abdominal pain.  Vital stable on presentation.  Labs without critical derangement, but given abdominal tenderness, CT abdomen pelvis was ordered to further evaluate.  Patient treated symptomatically with IV fluids, morphine, Zofran.  Clinical Course as of 06/29/23 1517  Tue Jun 29, 2023  1441 Patient reassessed.  Does feel improved on reevaluation after pain and nausea medicine.  No further episodes of diarrhea here.  CT read pending. [NR]    Clinical Course User Index [NR] Trinna Post, MD   Signed to obtain physician pending CT result and disposition.   FINAL CLINICAL IMPRESSION(S) / ED DIAGNOSES   Final diagnoses:  Generalized abdominal pain  Diarrhea, unspecified type     Rx / DC Orders   ED  Discharge Orders          Ordered    ondansetron (ZOFRAN) 4 MG tablet  Every 6 hours PRN        06/29/23 1517             Note:  This document was prepared using Dragon voice recognition software and may include unintentional dictation errors.   Trinna Post, MD 06/29/23 [phone]

## 2023-06-29 NOTE — Telephone Encounter (Signed)
  Chief Complaint: diarrhea, severe abdominal pain Symptoms: watery diarrhea- not responsive to Imodium, severe abdominal pain- comes and goes- rated at 8/10 Frequency: started Sunday Pertinent Negatives: Patient denies fever,blood in stool Disposition: [x] ED /[] Urgent Care (no appt availability in office) / [] Appointment(In office/virtual)/ []  [facility]/ [] Home Care/ [] Refused Recommended Disposition /[] [facility]/ []  Follow-up with PCP Additional Notes: Patient advised per protocol -ED

## 2023-06-29 NOTE — Telephone Encounter (Signed)
Reason for Disposition  [1] SEVERE abdominal pain AND [2] age > 60 years  Answer Assessment - Initial Assessment Questions 1. DIARRHEA SEVERITY: "How bad is the diarrhea?" "How many more stools have you had in the past 24 hours than normal?"    - NO DIARRHEA (SCALE 0)   - MILD (SCALE 1-3): Few loose or mushy BMs; increase of 1-3 stools over normal daily number of stools; mild increase in ostomy output.   -  MODERATE (SCALE 4-7): Increase of 4-6 stools daily over normal; moderate increase in ostomy output.   -  SEVERE (SCALE 8-10; OR "WORST POSSIBLE"): Increase of 7 or more stools daily over normal; moderate increase in ostomy output; incontinence.     Severe- 8 2. ONSET: "When did the diarrhea begin?"      Sunday 3. BM CONSISTENCY: "How loose or watery is the diarrhea?"      Mixture- mostly water 4. VOMITING: "Are you also vomiting?" If Yes, ask: "How many times in the past 24 hours?"      no 5. ABDOMEN PAIN: "Are you having any abdomen pain?" If Yes, ask: "What does it feel like?" (e.g., crampy, dull, intermittent, constant)      Sharp- hurts, nausea 6. ABDOMEN PAIN SEVERITY: If present, ask: "How bad is the pain?"  (e.g., Scale 1-10; mild, moderate, or severe)   - MILD (1-3): doesn't interfere with normal activities, abdomen soft and not tender to touch    - MODERATE (4-7): interferes with normal activities or awakens from sleep, abdomen tender to touch    - SEVERE (8-10): excruciating pain, doubled over, unable to do any normal activities       8/10, severe 7. ORAL INTAKE: If vomiting, "Have you been able to drink liquids?" "How much liquids have you had in the past 24 hours?"     Yes, unsure 8. HYDRATION: "Any signs of dehydration?" (e.g., dry mouth [not just dry lips], too weak to stand, dizziness, new weight loss) "When did you last urinate?"     No, last urinate- normal color 9. EXPOSURE: "Have you traveled to a foreign country recently?" "Have you been exposed to anyone with  diarrhea?" "Could you have eaten any food that was spoiled?"     no 10. ANTIBIOTIC USE: "Are you taking antibiotics now or have you taken antibiotics in the past 2 months?"       no 11. OTHER SYMPTOMS: "Do you have any other symptoms?" (e.g., fever, blood in stool)       no  Protocols used: Diarrhea-A-AH

## 2023-06-29 NOTE — ED Triage Notes (Signed)
Pt here with abd pain and nausea since Sun. Pt states she has been having nausea and diarrhea. Pt denies fevers.

## 2023-07-01 ENCOUNTER — Telehealth: Payer: Self-pay

## 2023-07-01 NOTE — Telephone Encounter (Signed)
Patient has been contacted to discuss colonoscopy referral.  Her colonoscopy is due to be repeated in October with Dr Servando Snare due to [facility] Syndrome.  Last colonoscopy performed with Dr. Servando Snare October 2023.  Reminder routed to myself to call pt back in Sept 2025.  Thanks, [place], [place]

## 2023-07-16 ENCOUNTER — Encounter: Payer: Self-pay | Admitting: Oncology

## 2023-07-23 ENCOUNTER — Ambulatory Visit
Admission: RE | Admit: 2023-07-23 | Discharge: 2023-07-23 | Disposition: A | Discharge status: Home / Self Care | Payer: Medicare Other | Source: Ambulatory Visit | Attending: Oncology | Admitting: Oncology

## 2023-07-23 DIAGNOSIS — Z1231 Encounter for screening mammogram for malignant neoplasm of breast: Secondary | ICD-10-CM | POA: Diagnosis present

## 2023-07-26 ENCOUNTER — Other Ambulatory Visit: Payer: Self-pay | Admitting: Oncology

## 2023-07-26 DIAGNOSIS — N6489 Other specified disorders of breast: Secondary | ICD-10-CM

## 2023-07-26 DIAGNOSIS — R928 Other abnormal and inconclusive findings on diagnostic imaging of breast: Secondary | ICD-10-CM

## 2023-08-18 ENCOUNTER — Encounter: Payer: Self-pay | Admitting: Oncology

## 2023-08-24 ENCOUNTER — Encounter: Payer: Self-pay | Admitting: Oncology

## 2023-08-24 ENCOUNTER — Ambulatory Visit
Admission: RE | Admit: 2023-08-24 | Discharge: 2023-08-24 | Disposition: A | Discharge status: Home / Self Care | Payer: 59 | Source: Ambulatory Visit | Attending: Oncology | Admitting: Oncology

## 2023-08-24 ENCOUNTER — Other Ambulatory Visit: Payer: Self-pay | Admitting: Oncology

## 2023-08-24 DIAGNOSIS — R928 Other abnormal and inconclusive findings on diagnostic imaging of breast: Secondary | ICD-10-CM

## 2023-08-24 DIAGNOSIS — N6489 Other specified disorders of breast: Secondary | ICD-10-CM | POA: Insufficient documentation

## 2023-09-10 ENCOUNTER — Ambulatory Visit
Admission: RE | Admit: 2023-09-10 | Discharge: 2023-09-10 | Disposition: A | Discharge status: Home / Self Care | Payer: 59 | Source: Ambulatory Visit | Attending: Oncology | Admitting: Oncology

## 2023-09-10 DIAGNOSIS — R928 Other abnormal and inconclusive findings on diagnostic imaging of breast: Secondary | ICD-10-CM | POA: Insufficient documentation

## 2023-09-10 HISTORY — PX: BREAST BIOPSY: SHX20

## 2023-09-10 MED ORDER — LIDOCAINE-EPINEPHRINE 1 %-1:100000 IJ SOLN
10.0000 mL | Freq: Once | INTRAMUSCULAR | Status: AC
  Administered 2023-09-10: 10 mL
  Filled 2023-09-10: qty 10

## 2023-09-10 MED ORDER — LIDOCAINE 1 % OPTIME INJ - NO CHARGE
5.0000 mL | Freq: Once | INTRAMUSCULAR | Status: AC
  Administered 2023-09-10: 5 mL
  Filled 2023-09-10: qty 6

## 2023-09-13 DIAGNOSIS — G894 Chronic pain syndrome: Secondary | ICD-10-CM | POA: Diagnosis not present

## 2023-09-13 DIAGNOSIS — Z79891 Long term (current) use of opiate analgesic: Secondary | ICD-10-CM | POA: Diagnosis not present

## 2023-09-13 DIAGNOSIS — Z5181 Encounter for therapeutic drug level monitoring: Secondary | ICD-10-CM | POA: Diagnosis not present

## 2023-09-13 DIAGNOSIS — M5459 Other low back pain: Secondary | ICD-10-CM | POA: Diagnosis not present

## 2023-09-13 LAB — SURGICAL PATHOLOGY

## 2023-09-16 DIAGNOSIS — F5101 Primary insomnia: Secondary | ICD-10-CM | POA: Diagnosis not present

## 2023-09-16 DIAGNOSIS — G894 Chronic pain syndrome: Secondary | ICD-10-CM | POA: Diagnosis not present

## 2023-09-16 DIAGNOSIS — M5459 Other low back pain: Secondary | ICD-10-CM | POA: Diagnosis not present

## 2023-09-16 DIAGNOSIS — M25562 Pain in left knee: Secondary | ICD-10-CM | POA: Diagnosis not present

## 2023-09-16 DIAGNOSIS — M25561 Pain in right knee: Secondary | ICD-10-CM | POA: Diagnosis not present

## 2023-09-16 DIAGNOSIS — Z5181 Encounter for therapeutic drug level monitoring: Secondary | ICD-10-CM | POA: Diagnosis not present

## 2023-09-16 DIAGNOSIS — Z79891 Long term (current) use of opiate analgesic: Secondary | ICD-10-CM | POA: Diagnosis not present

## 2023-09-27 DIAGNOSIS — Z5181 Encounter for therapeutic drug level monitoring: Secondary | ICD-10-CM | POA: Diagnosis not present

## 2023-09-27 DIAGNOSIS — M5459 Other low back pain: Secondary | ICD-10-CM | POA: Diagnosis not present

## 2023-09-27 DIAGNOSIS — G894 Chronic pain syndrome: Secondary | ICD-10-CM | POA: Diagnosis not present

## 2023-09-27 DIAGNOSIS — Z79891 Long term (current) use of opiate analgesic: Secondary | ICD-10-CM | POA: Diagnosis not present

## 2023-09-29 ENCOUNTER — Telehealth: Payer: Self-pay | Admitting: Family Medicine

## 2023-09-29 ENCOUNTER — Other Ambulatory Visit: Payer: Self-pay

## 2023-09-29 MED ORDER — ATORVASTATIN CALCIUM 80 MG PO TABS: 80.0000 mg | ORAL_TABLET | Freq: Every day | ORAL | 3 refills | Status: DC

## 2023-09-29 NOTE — Telephone Encounter (Signed)
 Converted to refill erquest

## 2023-09-29 NOTE — Telephone Encounter (Signed)
 [facility] faxed refill request for the following medications:   atorvastatin (LIPITOR) 80 MG tablet     Please advise.

## 2023-10-06 ENCOUNTER — Telehealth: Payer: Self-pay | Admitting: Family Medicine

## 2023-10-06 NOTE — Telephone Encounter (Signed)
 [facility] is requesting refill atorvastatin (LIPITOR) 80 MG tablet  Please advise

## 2023-10-09 ENCOUNTER — Other Ambulatory Visit: Payer: Self-pay | Admitting: Family Medicine

## 2023-10-09 DIAGNOSIS — J309 Allergic rhinitis, unspecified: Secondary | ICD-10-CM

## 2023-10-09 DIAGNOSIS — R21 Rash and other nonspecific skin eruption: Secondary | ICD-10-CM

## 2023-10-13 ENCOUNTER — Other Ambulatory Visit: Payer: Self-pay

## 2023-10-13 MED ORDER — ATORVASTATIN CALCIUM 80 MG PO TABS: 80.0000 mg | ORAL_TABLET | Freq: Every day | ORAL | 3 refills | Status: AC

## 2023-10-13 NOTE — Telephone Encounter (Signed)
 Received another refill request for this medication from [facility].  It was sent to [facility].  Please send to correct pharmacy.

## 2023-10-13 NOTE — Telephone Encounter (Signed)
Sent to corrected pharmacy  

## 2023-10-19 ENCOUNTER — Encounter: Payer: Self-pay | Admitting: Oncology

## 2023-10-19 NOTE — Research (Signed)
 Erroneous encounter, please discontinue. Chriss Driver, RN

## 2023-10-20 ENCOUNTER — Encounter: Payer: Self-pay | Admitting: Oncology

## 2023-11-11 DIAGNOSIS — Z5181 Encounter for therapeutic drug level monitoring: Secondary | ICD-10-CM | POA: Diagnosis not present

## 2023-11-11 DIAGNOSIS — G894 Chronic pain syndrome: Secondary | ICD-10-CM | POA: Diagnosis not present

## 2023-11-11 DIAGNOSIS — Z79891 Long term (current) use of opiate analgesic: Secondary | ICD-10-CM | POA: Diagnosis not present

## 2023-11-11 DIAGNOSIS — M25561 Pain in right knee: Secondary | ICD-10-CM | POA: Diagnosis not present

## 2023-11-11 DIAGNOSIS — M5459 Other low back pain: Secondary | ICD-10-CM | POA: Diagnosis not present

## 2023-11-11 DIAGNOSIS — G629 Polyneuropathy, unspecified: Secondary | ICD-10-CM | POA: Diagnosis not present

## 2023-11-12 ENCOUNTER — Encounter: Payer: Self-pay | Admitting: Oncology

## 2023-11-16 ENCOUNTER — Other Ambulatory Visit: Payer: Self-pay | Admitting: Family Medicine

## 2023-11-16 DIAGNOSIS — F32A Depression, unspecified: Secondary | ICD-10-CM

## 2023-11-16 DIAGNOSIS — I10 Essential (primary) hypertension: Secondary | ICD-10-CM

## 2023-11-17 ENCOUNTER — Ambulatory Visit: Payer: Self-pay

## 2023-11-17 NOTE — Telephone Encounter (Signed)
  Chief Complaint: Diarrhea Symptoms: liquid stool Frequency: ongoing for two weeks Pertinent Negatives: Patient denies fever, constant abdominal pain, blood or mucous in stool, recent antibiotics Disposition: [] ED /[] Urgent Care (no appt availability in office) / [x] Appointment(In office/virtual)/ []  [place] [facility]/ [] Home Care/ [] Refused Recommended Disposition /[] [place] [facility]/ []  Follow-up with PCP Additional Notes:  Developed diarrhea about 2 weeks ago, she used otc anti-diarreah medicine without much effect. Diarrhea is liquid and frequent, she has bm each time she eats. Mild intermittent abdominal pain when sitting on the toilet. She is drinking fluid, does not feel dehydrated. Acute evaluation advised, scheduled next available acute with alternate provider on 11/18/23. Educated on care advice as documented in protocol, patient verbalized understanding. Discussed reasons to call back.    Copied from CRM [id number]. Topic: Clinical - Red Word Triage >> Nov 17, 2023  9:36 AM Essie A wrote: Red Word that prompted transfer to Nurse Triage: Diarrhea for 2 weeks, headache and weak Reason for Disposition  [1] MODERATE diarrhea (e.g., 4-6 times / day more than normal) AND [2] present > 48 hours (2 days)  Protocols used: [facility]

## 2023-11-18 ENCOUNTER — Encounter: Payer: Self-pay | Admitting: Oncology

## 2023-11-18 ENCOUNTER — Other Ambulatory Visit: Payer: Self-pay

## 2023-11-18 ENCOUNTER — Ambulatory Visit (INDEPENDENT_AMBULATORY_CARE_PROVIDER_SITE_OTHER): Payer: Self-pay | Admitting: Physician Assistant

## 2023-11-18 ENCOUNTER — Encounter: Payer: Self-pay | Admitting: Physician Assistant

## 2023-11-18 VITALS — BP 104/84 | HR 74 | Resp 16 | Ht 63.0 in | Wt 219.0 lb

## 2023-11-18 DIAGNOSIS — R197 Diarrhea, unspecified: Secondary | ICD-10-CM | POA: Diagnosis not present

## 2023-11-18 DIAGNOSIS — J3089 Other allergic rhinitis: Secondary | ICD-10-CM

## 2023-11-18 DIAGNOSIS — R0981 Nasal congestion: Secondary | ICD-10-CM

## 2023-11-18 DIAGNOSIS — J40 Bronchitis, not specified as acute or chronic: Secondary | ICD-10-CM

## 2023-11-18 DIAGNOSIS — R609 Edema, unspecified: Secondary | ICD-10-CM

## 2023-11-18 MED ORDER — FUROSEMIDE 20 MG PO TABS: 20.0000 mg | ORAL_TABLET | Freq: Every day | ORAL | 1 refills | Status: AC | PRN

## 2023-11-18 MED ORDER — ALBUTEROL SULFATE HFA 108 (90 BASE) MCG/ACT IN AERS: 2.0000 | INHALATION_SPRAY | Freq: Four times a day (QID) | RESPIRATORY_TRACT | 0 refills | Status: DC | PRN

## 2023-11-18 MED ORDER — CETIRIZINE HCL 10 MG PO TABS
10.0000 mg | ORAL_TABLET | Freq: Every day | ORAL | 0 refills | Status: DC
Start: 2023-11-18 — End: 2023-12-13

## 2023-11-18 NOTE — Progress Notes (Signed)
 Established patient visit  Patient: Carrie Alvarez   DOB: [birth year]   73 y.o. Female  MRN: [id number] Visit Date: 11/18/2023  Today's healthcare provider: Blane Bunting, PA-C   No chief complaint on file.  Subjective       Discussed the use of AI scribe software for clinical note transcription with the patient, who gave verbal consent to proceed.  History of Present Illness Carrie Alvarez "Carrie Alvarez" is a 73 year old female who presents with diarrhea and nasal congestion.  Diarrhea initially occurred four to five times daily, now reduced to twice daily. There are no dietary changes, travel, or contact with ill individuals. The diarrhea is sometimes urgent. Hydration is maintained, and she eats normally.  Nasal congestion with watery drainage is present, attributed to sinus issues. She uses an allergy pill and has been prescribed Flonase  and Zyrtec. She experiences a sensation of choking associated with sinus drainage. No recent cough, but wheezing occurred the previous day.  Soreness in legs and back is persistent but not particularly painful, with a history of back pain.       01/13/2023    2:19 PM 03/18/2022   10:12 AM 10/09/2021    2:17 PM  Depression screen PHQ 2/9  Decreased Interest 0 0 0  Down, Depressed, Hopeless 0 0 0  PHQ - 2 Score 0 0 0  Altered sleeping  1   Tired, decreased energy  2   Change in appetite  0   Feeling bad or failure about yourself   0   Trouble concentrating  0   Moving slowly or fidgety/restless  1   Suicidal thoughts  0   PHQ-9 Score  4   Difficult doing work/chores  Not difficult at all        No data to display          Medications: Outpatient Medications Prior to Visit  Medication Sig   acetaminophen  (TYLENOL ) 500 MG tablet Take 500 mg by mouth every 6 (six) hours as needed for mild pain or moderate pain.  (Patient not taking: Reported on 05/21/2023)   albuterol  (VENTOLIN  HFA) 108 (90 Base) MCG/ACT inhaler INHALE 2  PUFFS BY MOUTH EVERY 6 HOURS AS NEEDED FOR WHEEZING AND FOR SHORTNESS OF BREATH   amLODipine  (NORVASC ) 5 MG tablet Take 1 tablet by mouth once daily   atorvastatin  (LIPITOR) 80 MG tablet Take 1 tablet (80 mg total) by mouth daily.   baclofen  (LIORESAL ) 10 MG tablet Take 1 tablet (10 mg total) by mouth 2 (two) times daily.   benzonatate  (TESSALON ) 100 MG capsule Take 1 capsule (100 mg total) by mouth 2 (two) times daily as needed for cough. (Patient not taking: Reported on 01/13/2023)   cetirizine (ZYRTEC) 10 MG tablet Take 10 mg by mouth daily at 6 (six) AM.   cholecalciferol  (VITAMIN D ) 1000 units tablet Take 1,000 Units by mouth daily.   fluticasone  (FLONASE ) 50 MCG/ACT nasal spray USE 2 SPRAY(S) IN EACH NOSTRIL ONCE DAILY   furosemide  (LASIX ) 20 MG tablet Take 1 tablet (20 mg total) by mouth daily as needed for fluid. (Patient not taking: Reported on 05/21/2023)   gabapentin  (NEURONTIN ) 300 MG capsule Take 300-600 mg by mouth 4 (four) times daily as needed (neuropathic pain).    letrozole  (FEMARA ) 2.5 MG tablet Take 2.5 mg by mouth daily. (Patient not taking: Reported on 05/21/2023)   NARCAN 4 MG/0.1ML LIQD nasal spray kit Place 1 spray into the nose.    ondansetron  (ZOFRAN -ODT) 4  MG disintegrating tablet Take 1 tablet (4 mg total) by mouth every 8 (eight) hours as needed for nausea or vomiting.   Oxycodone  HCl 10 MG TABS LIMIT ONE HALF TO ONE TABLET BY MOUTH 3 TO 5 TIMES DAILY IF TOLERATED   predniSONE  (DELTASONE ) 20 MG tablet Take 1 tablet (20 mg total) by mouth daily with breakfast. (Patient not taking: Reported on 01/13/2023)   PREVIDENT 5000 DRY MOUTH 1.1 % GEL dental gel Place onto teeth at bedtime.   pyridoxine  (B-6) 100 MG tablet Take 100 mg by mouth daily.   sertraline  (ZOLOFT ) 100 MG tablet Take 2 tablets by mouth once daily   traZODone  (DESYREL ) 50 MG tablet TAKE 1 TO 2 TABLETS BY MOUTH AT BEDTIME AS NEEDED FOR SLEEP   vitamin B-12 (CYANOCOBALAMIN ) 500 MCG tablet Take 500 mcg by mouth  daily.   Facility-Administered Medications Prior to Visit  Medication Dose Route Frequency Provider   heparin  lock flush 100 unit/mL  500 Units Intravenous Once Finnegan, Timothy J, MD   heparin  lock flush 100 unit/mL  500 Units Intravenous Once Finnegan, Timothy J, MD   sodium chloride  flush (NS) 0.9 % injection 10 mL  10 mL Intravenous PRN Adrian Alba, Timothy J, MD    Review of Systems  All other systems reviewed and are negative. All negative Except see HPI       Objective    There were no vitals taken for this visit.    Physical Exam Vitals reviewed.  Constitutional:      General: She is not in acute distress.    Appearance: Normal appearance. She is well-developed and normal weight. She is not diaphoretic.  HENT:     Head: Normocephalic and atraumatic.     Right Ear: Ear canal and external ear normal. There is impacted cerumen (partially).     Left Ear: Ear canal and external ear normal.     Ears:     Comments: Fluids behind the tm    Nose: Congestion and rhinorrhea present.     Mouth/Throat:     Pharynx: Posterior oropharyngeal erythema present.     Comments: Postnasal drainage noted Eyes:     General: No scleral icterus.       Right eye: Discharge present.        Left eye: Discharge present.    Extraocular Movements: Extraocular movements intact.     Conjunctiva/sclera: Conjunctivae normal.     Pupils: Pupils are equal, round, and reactive to light.  Neck:     Thyroid : No thyromegaly.  Cardiovascular:     Rate and Rhythm: Normal rate and regular rhythm.     Pulses: Normal pulses.     Heart sounds: Normal heart sounds. No murmur heard. Pulmonary:     Effort: Pulmonary effort is normal. No respiratory distress.     Breath sounds: Normal breath sounds. No wheezing, rhonchi or rales.  Abdominal:     General: Abdomen is flat. Bowel sounds are normal.     Palpations: Abdomen is soft.  Musculoskeletal:     Cervical back: Neck supple.     Right lower leg: No  edema.     Left lower leg: No edema.  Lymphadenopathy:     Cervical: No cervical adenopathy.  Skin:    General: Skin is warm and dry.     Findings: No rash.  Neurological:     Mental Status: She is alert and oriented to person, place, and time. Mental status is at baseline.  Psychiatric:  Mood and Affect: Mood normal.        Behavior: Behavior normal.      No results found for any visits on 11/18/23.      Assessment & Plan   Seasonal allergic rhinitis due to other allergic trigger - cetirizine (ZYRTEC) 10 MG tablet; Take 1 tablet (10 mg total) by mouth daily at 6 (six) AM.  Dispense: 30 tablet; Refill: 0  Nasal congestion Nasal congestion and drainage Nasal congestion and watery drainage likely due to seasonal allergies, possibly exacerbated by viral infection or other allergens. - Prescribed Flonase  for nasal congestion. - Prescribed Zyrtec for allergies. Use otc eye drops for allergies - Recommended saline nasal rinse. - Advised warm salt water  gargles. - Instructed to monitor for worsening symptoms such as wheezing or shortness of breath. - Advised to return if symptoms worsen or do not improve.  Diarrhea Acute diarrhea reduced in frequency, likely related to viral infection or dietary factors. Improvement noted. - Advised to drink plenty of water  and electrolytes such as diluted Gatorade or Pedialyte. - Recommended bland diet including rice crackers and applesauce, bananas. - Advised to monitor symptoms and return if diarrhea persists or worsens. - Offered basic lab work if symptoms do not improve. - Considered stool testing if symptoms persist.   No orders of the defined types were placed in this encounter.   No follow-ups on file.   The patient was advised to call back or seek an in-person evaluation if the symptoms worsen or if the condition fails to improve as anticipated.  I discussed the assessment and treatment plan with the patient. The patient was  provided an opportunity to ask questions and all were answered. The patient agreed with the plan and demonstrated an understanding of the instructions.  I, Deanda Ruddell, PA-C have reviewed all documentation for this visit. The documentation on 11/18/2023  for the exam, diagnosis, procedures, and orders are all accurate and complete.  Blane Bunting, [facility], MMS [facility] [phone] (phone) [phone] (fax)  [facility]

## 2023-11-19 ENCOUNTER — Inpatient Hospital Stay (HOSPITAL_BASED_OUTPATIENT_CLINIC_OR_DEPARTMENT_OTHER): Payer: 59 | Admitting: Oncology

## 2023-11-19 ENCOUNTER — Encounter: Payer: Self-pay | Admitting: Oncology

## 2023-11-19 ENCOUNTER — Inpatient Hospital Stay: Payer: 59 | Attending: Oncology

## 2023-11-19 VITALS — BP 97/74 | HR 72 | Temp 97.3°F | Resp 18 | Ht 63.0 in | Wt 217.0 lb

## 2023-11-19 DIAGNOSIS — Z006 Encounter for examination for normal comparison and control in clinical research program: Secondary | ICD-10-CM | POA: Diagnosis present

## 2023-11-19 DIAGNOSIS — Z853 Personal history of malignant neoplasm of breast: Secondary | ICD-10-CM | POA: Insufficient documentation

## 2023-11-19 DIAGNOSIS — Z9221 Personal history of antineoplastic chemotherapy: Secondary | ICD-10-CM | POA: Insufficient documentation

## 2023-11-19 DIAGNOSIS — C184 Malignant neoplasm of transverse colon: Secondary | ICD-10-CM | POA: Diagnosis not present

## 2023-11-19 DIAGNOSIS — Z79899 Other long term (current) drug therapy: Secondary | ICD-10-CM | POA: Diagnosis not present

## 2023-11-19 DIAGNOSIS — R600 Localized edema: Secondary | ICD-10-CM | POA: Diagnosis not present

## 2023-11-19 DIAGNOSIS — G620 Drug-induced polyneuropathy: Secondary | ICD-10-CM | POA: Diagnosis not present

## 2023-11-19 DIAGNOSIS — Z1509 Genetic susceptibility to other malignant neoplasm: Secondary | ICD-10-CM | POA: Insufficient documentation

## 2023-11-19 DIAGNOSIS — T451X5A Adverse effect of antineoplastic and immunosuppressive drugs, initial encounter: Secondary | ICD-10-CM | POA: Diagnosis not present

## 2023-11-19 DIAGNOSIS — C189 Malignant neoplasm of colon, unspecified: Secondary | ICD-10-CM | POA: Insufficient documentation

## 2023-11-19 DIAGNOSIS — Z8542 Personal history of malignant neoplasm of other parts of uterus: Secondary | ICD-10-CM | POA: Diagnosis not present

## 2023-11-19 LAB — CMP (CANCER CENTER ONLY)
ALT: 12 U/L (ref 0–44)
AST: 15 U/L (ref 15–41)
Albumin: 3.4 g/dL — ABNORMAL LOW (ref 3.5–5.0)
Alkaline Phosphatase: 67 U/L (ref 38–126)
Anion gap: 7 (ref 5–15)
BUN: 11 mg/dL (ref 8–23)
CO2: 30 mmol/L (ref 22–32)
Calcium: 8.6 mg/dL — ABNORMAL LOW (ref 8.9–10.3)
Chloride: 104 mmol/L (ref 98–111)
Creatinine: 1.2 mg/dL — ABNORMAL HIGH (ref 0.44–1.00)
GFR, Estimated: 48 mL/min — ABNORMAL LOW (ref >60)
Glucose, Bld: 100 mg/dL — ABNORMAL HIGH (ref 70–99)
Potassium: 3.2 mmol/L — ABNORMAL LOW (ref 3.5–5.1)
Sodium: 141 mmol/L (ref 135–145)
Total Bilirubin: 1.2 mg/dL (ref 0.0–1.2)
Total Protein: 6.3 g/dL — ABNORMAL LOW (ref 6.5–8.1)

## 2023-11-19 LAB — CBC WITH DIFFERENTIAL/PLATELET
Abs Immature Granulocytes: 0.03 10*3/uL (ref 0.00–0.07)
Basophils Absolute: 0 10*3/uL (ref 0.0–0.1)
Basophils Relative: 1 %
Eosinophils Absolute: 0.2 10*3/uL (ref 0.0–0.5)
Eosinophils Relative: 4 %
HCT: 29 % — ABNORMAL LOW (ref 36.0–46.0)
Hemoglobin: 10.1 g/dL — ABNORMAL LOW (ref 12.0–15.0)
Immature Granulocytes: 1 %
Lymphocytes Relative: 18 %
Lymphs Abs: 1.2 10*3/uL (ref 0.7–4.0)
MCH: 28.8 pg (ref 26.0–34.0)
MCHC: 34.8 g/dL (ref 30.0–36.0)
MCV: 82.6 fL (ref 80.0–100.0)
Monocytes Absolute: 0.5 10*3/uL (ref 0.1–1.0)
Monocytes Relative: 8 %
Neutro Abs: 4.5 10*3/uL (ref 1.7–7.7)
Neutrophils Relative %: 68 %
Platelets: 181 10*3/uL (ref 150–400)
RBC: 3.51 MIL/uL — ABNORMAL LOW (ref 3.87–5.11)
RDW: 15.2 % (ref 11.5–15.5)
WBC: 6.5 10*3/uL (ref 4.0–10.5)
nRBC: 0 % (ref 0.0–0.2)

## 2023-11-19 MED ORDER — GABAPENTIN 600 MG PO TABS: 600.0000 mg | ORAL_TABLET | Freq: Three times a day (TID) | ORAL | 3 refills | Status: AC

## 2023-11-19 NOTE — Progress Notes (Unsigned)
 [facility]  [phone]) [phone] [phone]) [phone]  ID: Carrie Alvarez OB: Feb 02, 1951  MR#: [id number]  QIO#:[id number]  Patient Care Team: Lamon Pillow, MD as PCP - General (Family Medicine) Shellie Dials, MD as Consulting Physician (Oncology) Toula French, MD as Attending Physician (Pain Medicine) Bary Boss., MD as Consulting Physician (Ophthalmology) Rochell Chroman, RN as Oncology Nurse Navigator [name], Drucie [name], MD as Referring Physician (Anesthesiology)  CHIEF COMPLAINT: Stage IIa ER+ left breast cancer with no breast lesion and axillary lymph node metastasis.  Now with stage IIIa colon cancer.  INTERVAL HISTORY: Patient returns to clinic today for routine 68-month evaluation, repeat laboratory work, and discussion of her imaging results.  She feels her peripheral neuropathy is getting worse, but otherwise feels well.  She has no other neurologic complaints.  She does not complain of any weakness or fatigue today. She does not complain of back pain today.  She denies any fevers.  She has no chest pain, shortness of breath, cough, or hemoptysis.  She denies any nausea, vomiting, constipation, or diarrhea.  She has no melena or hematochezia.  She has no urinary complaints.  Patient offers no further specific complaints today.  REVIEW OF SYSTEMS:   Review of Systems  Constitutional: Negative.  Negative for fever, malaise/fatigue and weight loss.  Respiratory: Negative.  Negative for cough and shortness of breath.   Cardiovascular: Negative.  Negative for chest pain and leg swelling.  Gastrointestinal: Negative.  Negative for abdominal pain, blood in stool, constipation, diarrhea, melena, nausea and vomiting.  Genitourinary: Negative.  Negative for dysuria.  Musculoskeletal: Negative.  Negative for back pain, falls and neck pain.  Skin: Negative.  Negative for rash.  Neurological:  Positive for tingling and sensory change.  Negative for dizziness, focal weakness, weakness and headaches.  Psychiatric/Behavioral: Negative.  Negative for depression. The patient is not nervous/anxious.    As per HPI. Otherwise, a complete review of systems is negative.  PAST MEDICAL HISTORY: Past Medical History:  Diagnosis Date  . Allergy   . Anemia   . Arthritis   . Back pain   . Breast cancer (HCC) 2015   LT LUMPECTOMY 12-2014 FOLLOWING CHEMO  . Carpal tunnel syndrome    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  . Cataract   . Chronic pain   . Colon cancer (HCC) 01/2019  . Colon cancer (HCC) 2020  . Depression   . Dyspnea   . Family history of breast cancer   . Family history of colon cancer   . GERD (gastroesophageal reflux disease)    problem with reflux during chemo  . History of methicillin resistant staphylococcus aureus (MRSA)   . Hypercholesteremia   . Hypertension   . Lumbosacral pain    sees pain management in [place]  . Obesity   . Personal history of chemotherapy 2016   left breast ca  . Personal history of chemotherapy 2020   colon ca  . Pinched nerve    left side, lumbar area  . Uterine cancer (HCC) 1994    PAST SURGICAL HISTORY: Past Surgical History:  Procedure Laterality Date  . ABDOMINAL HYSTERECTOMY  1994   UTERINE CA  . AXILLARY LYMPH NODE BIOPSY Left 12/18/2014   Procedure: AXILLARY LYMPH NODE BIOPSY/;  Surgeon: Marshall Skeeter, MD;  Location: [facility];  Service: General;  Laterality: Left;  . AXILLARY LYMPH NODE DISSECTION Left 12/18/2014   Procedure: AXILLARY LYMPH NODE DISSECTION;  Surgeon: Marshall Skeeter, MD;  Location: [facility];  Service: General;  Laterality: Left;  . BREAST BIOPSY Left 05/2014   CORE BX OF LN, METASTATIC ADENOCARCINOMA  . BREAST BIOPSY Left 09/10/2023   US  Core Axilla, hydro mark- path pending  . BREAST LUMPECTOMY Left 12/2014   CHEMO FIRST THEN SURGERY OF LN  . BREAST SURGERY     lymph node removal  . CATARACT EXTRACTION W/PHACO Left 11/26/2020    Procedure: CATARACT EXTRACTION PHACO AND INTRAOCULAR LENS PLACEMENT (IOC) LEFT;  Surgeon: Clair Crews, MD;  Location: [facility];  Service: Ophthalmology;  Laterality: Left;  6.40 00:43.9  . CATARACT EXTRACTION W/PHACO Right 12/10/2020   Procedure: CATARACT EXTRACTION PHACO AND INTRAOCULAR LENS PLACEMENT (IOC) RIGHT;  Surgeon: Clair Crews, MD;  Location: [facility];  Service: Ophthalmology;  Laterality: Right;  7.59 0:47.4  . CHOLECYSTECTOMY N/A 04/19/2016   Procedure: LAPAROSCOPIC CHOLECYSTECTOMY WITH INTRAOPERATIVE CHOLANGIOGRAM;  Surgeon: Kandis Ormond, MD;  Location: [facility];  Service: General;  Laterality: N/A;  . COLON RESECTION Right 01/10/2019   Procedure: HAND ASSISTED LAPAROSCOPIC RIGHT COLON RESECTION;  Surgeon: Alben Alma, MD;  Location: [facility];  Service: General;  Laterality: Right;  . COLONOSCOPY WITH PROPOFOL  N/A 12/30/2018   Procedure: COLONOSCOPY WITH PROPOFOL ;  Surgeon: Marnee Sink, MD;  Location: [facility];  Service: Endoscopy;  Laterality: N/A;  . COLONOSCOPY WITH PROPOFOL  N/A 03/05/2020   Procedure: COLONOSCOPY WITH PROPOFOL ;  Surgeon: Marnee Sink, MD;  Location: [facility];  Service: Endoscopy;  Laterality: N/A;  . COLONOSCOPY WITH PROPOFOL  N/A 05/12/2022   Procedure: COLONOSCOPY WITH PROPOFOL ;  Surgeon: Marnee Sink, MD;  Location: [facility];  Service: Endoscopy;  Laterality: N/A;  . medial branch block  11/06/2015   lumbar facet Dr. Pollyann Brinks  . POLYPECTOMY N/A 12/30/2018   Procedure: POLYPECTOMY;  Surgeon: Marnee Sink, MD;  Location: [facility];  Service: Endoscopy;  Laterality: N/A;  Clips placed at Hepatic Flexure Polyp (2) and Transverse Colon Polyp (4) removal sites  . PORTACATH PLACEMENT Right 02/02/2019   Procedure: INSERTION PORT-A-CATH;  Surgeon: Alben Alma, MD;  Location: [facility];  Service: General;  Laterality: Right;  . RADIOFREQUENCY ABLATION     lumbar  . SENTINEL NODE BIOPSY Left  12/18/2014   Procedure: SENTINEL NODE BIOPSY;  Surgeon: Marshall Skeeter, MD;  Location: [facility];  Service: General;  Laterality: Left;    FAMILY HISTORY Family History  Problem Relation Age of Onset  . Breast cancer Sister 75  . Colon cancer Sister   . Colon cancer Mother        dx 78s  . Hypertension Mother   . Asthma Mother   . Cancer Father        voice box removed  . Cancer Maternal Grandmother        unsure type  . Colon cancer Cousin   . Cancer Cousin        unsure type       ADVANCED DIRECTIVES:    HEALTH MAINTENANCE: Social History   Tobacco Use  . Smoking status: Former    Current packs/day: 0.00    Types: Cigarettes    Quit date: 07/18/2014    Years since quitting: 9.3  . Smokeless tobacco: Never  Vaping Use  . Vaping status: Never Used  Substance Use Topics  . Alcohol use: No    Alcohol/week: 0.0 standard drinks of alcohol  . Drug use: No    No Known Allergies  Current Outpatient Medications  Medication Sig Dispense Refill  .  albuterol  (VENTOLIN  HFA) 108 (90 Base) MCG/ACT inhaler Inhale 2 puffs into the lungs every 6 (six) hours as needed for wheezing or shortness of breath. 9 g 0  . amLODipine  (NORVASC ) 5 MG tablet Take 1 tablet by mouth once daily 90 tablet 0  . atorvastatin  (LIPITOR) 80 MG tablet Take 1 tablet (80 mg total) by mouth daily. 90 tablet 3  . baclofen  (LIORESAL ) 10 MG tablet Take 1 tablet (10 mg total) by mouth 2 (two) times daily. 30 each 0  . cetirizine  (ZYRTEC ) 10 MG tablet Take 1 tablet (10 mg total) by mouth daily at 6 (six) AM. 30 tablet 0  . cholecalciferol  (VITAMIN D ) 1000 units tablet Take 1,000 Units by mouth daily.    . fluticasone  (FLONASE ) 50 MCG/ACT nasal spray USE 2 SPRAY(S) IN EACH NOSTRIL ONCE DAILY 48 mL 1  . furosemide  (LASIX ) 20 MG tablet Take 1 tablet (20 mg total) by mouth daily as needed for fluid. 20 tablet 1  . gabapentin  (NEURONTIN ) 300 MG capsule Take 300-600 mg by mouth 4 (four) times daily as needed  (neuropathic pain).     . NARCAN 4 MG/0.1ML LIQD nasal spray kit Place 1 spray into the nose.     . ondansetron  (ZOFRAN -ODT) 4 MG disintegrating tablet Take 1 tablet (4 mg total) by mouth every 8 (eight) hours as needed for nausea or vomiting. 8 tablet 0  . Oxycodone  HCl 10 MG TABS LIMIT ONE HALF TO ONE TABLET BY MOUTH 3 TO 5 TIMES DAILY IF TOLERATED    . PREVIDENT 5000 DRY MOUTH 1.1 % GEL dental gel Place onto teeth at bedtime.    . pyridoxine  (B-6) 100 MG tablet Take 100 mg by mouth daily.    . sertraline  (ZOLOFT ) 100 MG tablet Take 2 tablets by mouth once daily 180 tablet 0  . traZODone  (DESYREL ) 50 MG tablet TAKE 1 TO 2 TABLETS BY MOUTH AT BEDTIME AS NEEDED FOR SLEEP 180 tablet 2  . vitamin B-12 (CYANOCOBALAMIN ) 500 MCG tablet Take 500 mcg by mouth daily.     No current facility-administered medications for this visit.   Facility-Administered Medications Ordered in Other Visits  Medication Dose Route Frequency Provider Last Rate Last Admin  . heparin  lock flush 100 unit/mL  500 Units Intravenous Once Sapphire Tygart J, MD      . heparin  lock flush 100 unit/mL  500 Units Intravenous Once Tyger Wichman J, MD      . sodium chloride  flush (NS) 0.9 % injection 10 mL  10 mL Intravenous PRN Tryniti Laatsch J, MD   10 mL at 02/27/19 0925    OBJECTIVE: Vitals:   11/19/23 1032  BP: 97/74  Pulse: 72  Resp: 18  Temp: (!) 97.3 F (36.3 C)  SpO2: 97%      Body mass index is 38.44 kg/m.    ECOG FS:1 - Symptomatic but completely ambulatory  General: Well-developed, well-nourished, no acute distress. Eyes: Pink conjunctiva, anicteric sclera. HEENT: Normocephalic, moist mucous membranes. Lungs: No audible wheezing or coughing. Heart: Regular rate and rhythm. Abdomen: Soft, nontender, no obvious distention. Musculoskeletal: No edema, cyanosis, or clubbing. Neuro: Alert, answering all questions appropriately. Cranial nerves grossly intact. Skin: No rashes or petechiae noted. Psych:  Normal affect.  LAB RESULTS:  Lab Results  Component Value Date   NA 141 11/19/2023   K 3.2 (L) 11/19/2023   CL 104 11/19/2023   CO2 30 11/19/2023   GLUCOSE 100 (H) 11/19/2023   BUN 11 11/19/2023   CREATININE  1.20 (H) 11/19/2023   CALCIUM  8.6 (L) 11/19/2023   PROT 6.3 (L) 11/19/2023   ALBUMIN 3.4 (L) 11/19/2023   AST 15 11/19/2023   ALT 12 11/19/2023   ALKPHOS 67 11/19/2023   BILITOT 1.2 11/19/2023   GFRNONAA 48 (L) 11/19/2023   GFRAA 57 (L) 02/26/2020    Lab Results  Component Value Date   WBC 6.5 11/19/2023   NEUTROABS 4.5 11/19/2023   HGB 10.1 (L) 11/19/2023   HCT 29.0 (L) 11/19/2023   MCV 82.6 11/19/2023   PLT 181 11/19/2023     STUDIES: No results found.   ASSESSMENT: Stage IIa ER+ left breast cancer with no breast lesion and axillary lymph node metastasis. (TxN1M0)  HER-2 negative.  Now with stage IIIa colon cancer.  PLAN:   Stage IIIa colon cancer: Pathology report reviewed independently.  Previously, patient agreed to enroll in clinical trial.  Oxaliplatin  was discontinued early secondary to worsening neuropathy.  Patient completed 12 cycles of chemotherapy on July 31, 2019 and then completed maintenance Tecentriq  on January 29, 2020.  Her most recent imaging on May 11, 2023 reviewed independently and reported as above with no obvious evidence of recurrent or progressive disease.  CEA continues to be within normal limits.  Today's result is pending.  Her most recent colonoscopy on May 12, 2022 only revealed 2 polyps.  Recommendation was repeat in 5 years, but given her underlying Lynch syndrome this likely will need to be repeated sooner.  Have sent a referral to GI for consideration of repeat colonoscopy in October 2025.  Per research protocol, patient will continue to have CT scan and CEA every 6 months for a total of 5 years completing follow-up in November 2025.  Return to clinic in 6 months with repeat laboratory work and evaluation only.  Patient's  next imaging will occur in November 2025.  Stage IIa ER+ left breast cancer with no breast lesion and axillary lymph node metastasis: Despite no obvious breast lesion on mammogram or breast MRI, pathology and pattern of spread was consistent with primary breast cancer. CT and bone scan at time of diagnosis revealed no other evidence of malignancy.  She only received 3 cycles of Adriamycin and Cytoxan. Taxol  was discontinued early as well secondary to persistent peripheral neuropathy. Patient completed her chemotherapy on Nov 19, 2014.  She elected not to pursue axillary node dissection given the potential morbidity of this procedure. It was also elected not to pursue adjuvant XRT given there was no primary breast lesion.  Patient completed approximately 4 years of treatment with letrozole , but this was discontinued secondary to enrolling in clinical trial for her newly diagnosed colon cancer.  Her most recent mammogram in January 2024 was reported as BI-RADS 1.  Repeat in January 2025.   Bone health: Patient's most recent bone mineral density on February 03, 2018 reported T score of -0.7 which is unchanged from 3 years prior.  Continue monitoring and evaluation per primary care. Lynch syndrome: Genetic testing confirmed patient has Lynch syndrome.  Likely maternal patient reports her mother had colon cancer at a very young age.  She reports her sister and son both have tested positive.  She reports her other children are not interested in genetic testing.  Appreciate genetics input.  Continue yearly colonoscopies as above. Endometrial cancer: Patient has had a total hysterectomy. Back pain/sciatica: Patient does not complain of this today.  Patient previously reported her insurance will only allow 3 injections every 6 months.   Peripheral neuropathy:  Patient reports this is slightly worse.  Continue gabapentin  600 mg 4 times per day.  Continue follow-up with pain clinic as scheduled.  She has agreed to a referral  to neurology today.   Peripheral edema: Patient is complain of this today.  Continue Lasix  as per primary care instructions.    Patient expressed understanding and was in agreement with this plan. She also understands that She can call clinic at any time with any questions, concerns, or complaints.   Breast cancer metastasized to axillary lymph node   Staging form: Breast, AJCC 7th Edition     Clinical stage from 10/22/2014: Stage Unknown (TX, N1, M0) - Signed by Shellie Dials, MD on 10/22/2014   Shellie Dials, MD   11/19/2023 10:50 AM

## 2023-11-19 NOTE — Research (Cosign Needed Addendum)
"  Randomized Trial of Standard Chemotherapy Alone or Combined with Atezolizumab  as Adjuvant Therapy for Stage III Colon Cancer and Deficient DNA Mismatch Repair"   [name] 54 Month Follow Up:   Patient in to the cancer center unaccompanied for her 54 month follow up visit for [id number] "[name]" protocol. Dr. Adrian Alba examined patient today. Patient informed by Dr. Adrian Alba that everything looks really good from an oncology point of view currently, her labs looked good today. He encouraged her to increase her potassium intake as it was a little low. He suggested foods that were high in potassium for her to eat.  Dr. Adrian Alba requested she have a colonoscopy as she had not had one last year. Patient in agreement with that plan and request Dr. Adrian Alba send the referral for her. Patients only complaint today is continued neuropathy in her hands and feet. She states it seems to be getting worse to her-Grade 2 due to issues with walking and using her hands sometimes. She states she is falling a lot more due to the neuropathy possibly.  Dr. Adrian Alba will increase her Gabapentin  to 600 mg TID and send in a new prescription for her, he suggests patient see a neurologist to have this evaluated as her chemotherapy completed a while ago. Patient states she doesn't want to go to a different doctor. Dr. Adrian Alba suggested that she try the increase in Gabapentin , he will evaluate effectiveness at her next 6 month visit in November. He will prescribe Lyrica for her with the Gabapentin  if needed then. Patient is in agreement with this plan.  Medications were reviewed with the patient, there were no changes. The research schedule was reviewed, patient was informed that a CT will be scheduled for her 5 year follow up with Dr. Adrian Alba. Explained that after the 5 year visit she will not have to come in the office or have any further scans for the protocol. Scans will be per Dr. Layton Prima orders after that visit. Research nurse will  call her every 6 months to see how she is doing until she completes the study at 8 years. Patient was in agreement with all plans moving forward for research today. Patient was encouraged to call if she has any questions or concerns.    Luise Saint, RN 11/19/23 11:35 AM

## 2023-11-20 ENCOUNTER — Encounter: Payer: Self-pay | Admitting: Oncology

## 2023-11-20 LAB — CEA: CEA: 3.3 ng/mL (ref 0.0–4.7)

## 2023-11-23 ENCOUNTER — Other Ambulatory Visit: Payer: Self-pay | Admitting: Family Medicine

## 2023-11-23 DIAGNOSIS — I10 Essential (primary) hypertension: Secondary | ICD-10-CM

## 2023-11-24 ENCOUNTER — Other Ambulatory Visit: Payer: Self-pay

## 2023-11-24 ENCOUNTER — Encounter: Payer: Self-pay | Admitting: Oncology

## 2023-11-24 ENCOUNTER — Telehealth: Payer: Self-pay

## 2023-11-24 DIAGNOSIS — C184 Malignant neoplasm of transverse colon: Secondary | ICD-10-CM

## 2023-11-24 MED ORDER — NA SULFATE-K SULFATE-MG SULF 17.5-3.13-1.6 GM/177ML PO SOLN: 1.0000 | Freq: Once | ORAL | 0 refills | Status: AC

## 2023-11-24 NOTE — Telephone Encounter (Signed)
 Gastroenterology Pre-Procedure Review  Request Date: 05/11/24 Requesting Physician: Dr. Ole Berkeley  PATIENT REVIEW QUESTIONS: The patient responded to the following health history questions as indicated:    1. Are you having any GI issues? no 2. Do you have a personal history of Polyps? Yes. Personal history of colon cancer.  Pt was seen by Dr. Adrian Alba on 11/19/23 office visit noted, "Recommendation was repeat in 5 years, but given her underlying Lynch syndrome this likely will need to be repeated sooner.  A referral was sent back to GI to schedule repeat colonoscopy in October 2025".  Last colonoscopy performed with Dr. Ole Berkeley 05/12/22. 3. Do you have a family history of Colon Cancer or Polyps? yes (sister and mother had colon cancer) 4. Diabetes Mellitus? no 5. Joint replacements in the past 12 months?no 6. Major health problems in the past 3 months?no 7. Any artificial heart valves, MVP, or defibrillator?no    MEDICATIONS & ALLERGIES:    Patient reports the following regarding taking any anticoagulation/antiplatelet therapy:   Plavix, Coumadin, Eliquis , Xarelto, Lovenox , Pradaxa, Brilinta, or Effient? no Aspirin ? no  Patient confirms/reports the following medications:  Current Outpatient Medications  Medication Sig Dispense Refill   albuterol  (VENTOLIN  HFA) 108 (90 Base) MCG/ACT inhaler Inhale 2 puffs into the lungs every 6 (six) hours as needed for wheezing or shortness of breath. 9 g 0   amLODipine  (NORVASC ) 5 MG tablet Take 1 tablet by mouth once daily 90 tablet 0   atorvastatin  (LIPITOR) 80 MG tablet Take 1 tablet (80 mg total) by mouth daily. 90 tablet 3   baclofen  (LIORESAL ) 10 MG tablet Take 1 tablet (10 mg total) by mouth 2 (two) times daily. 30 each 0   cetirizine  (ZYRTEC ) 10 MG tablet Take 1 tablet (10 mg total) by mouth daily at 6 (six) AM. 30 tablet 0   cholecalciferol  (VITAMIN D ) 1000 units tablet Take 1,000 Units by mouth daily.     fluticasone  (FLONASE ) 50 MCG/ACT nasal spray  USE 2 SPRAY(S) IN EACH NOSTRIL ONCE DAILY 48 mL 1   furosemide  (LASIX ) 20 MG tablet Take 1 tablet (20 mg total) by mouth daily as needed for fluid. 20 tablet 1   gabapentin  (NEURONTIN ) 600 MG tablet Take 1 tablet (600 mg total) by mouth 3 (three) times daily. 90 tablet 3   NARCAN 4 MG/0.1ML LIQD nasal spray kit Place 1 spray into the nose.      ondansetron  (ZOFRAN -ODT) 4 MG disintegrating tablet Take 1 tablet (4 mg total) by mouth every 8 (eight) hours as needed for nausea or vomiting. 8 tablet 0   Oxycodone  HCl 10 MG TABS LIMIT ONE HALF TO ONE TABLET BY MOUTH 3 TO 5 TIMES DAILY IF TOLERATED     PREVIDENT 5000 DRY MOUTH 1.1 % GEL dental gel Place onto teeth at bedtime.     pyridoxine  (B-6) 100 MG tablet Take 100 mg by mouth daily.     sertraline  (ZOLOFT ) 100 MG tablet Take 2 tablets by mouth once daily 180 tablet 0   traZODone  (DESYREL ) 50 MG tablet TAKE 1 TO 2 TABLETS BY MOUTH AT BEDTIME AS NEEDED FOR SLEEP 180 tablet 2   vitamin B-12 (CYANOCOBALAMIN ) 500 MCG tablet Take 500 mcg by mouth daily.     No current facility-administered medications for this visit.   Facility-Administered Medications Ordered in Other Visits  Medication Dose Route Frequency Provider Last Rate Last Admin   heparin  lock flush 100 unit/mL  500 Units Intravenous Once Finnegan, Timothy J, MD  heparin  lock flush 100 unit/mL  500 Units Intravenous Once Finnegan, Timothy J, MD       sodium chloride  flush (NS) 0.9 % injection 10 mL  10 mL Intravenous PRN Finnegan, Timothy J, MD   10 mL at 02/27/19 3295    Patient confirms/reports the following allergies:  No Known Allergies  No orders of the defined types were placed in this encounter.   AUTHORIZATION INFORMATION Primary Insurance: 1D#: Group #:  Secondary Insurance: 1D#: Group #:  SCHEDULE INFORMATION: Date: 05/11/24 Time: Location: [facility]

## 2023-11-25 ENCOUNTER — Ambulatory Visit: Payer: Self-pay

## 2023-11-25 NOTE — Telephone Encounter (Signed)
  Chief Complaint: pedal edema Symptoms: bilateral mild pedal edema and pain Frequency: x 1 day Pertinent Negatives: Patient denies redness, drainage, warmth, known injuries, chest pain, SOB. Disposition: [] ED /[] Urgent Care (no appt availability in office) / [x] Appointment(In office/virtual)/ []  [facility]/ [] Home Care/ [] Refused Recommended Disposition /[] [facility]/ []  Follow-up with PCP Additional Notes: Patient agreeable to acute visit tomorrow and agreeable to call back for new or worsening symptoms. Advised patient to elevate lower extremities a few times daily.  Copied from CRM [id number]. Topic: Clinical - Red Word Triage >> Nov 25, 2023  9:13 AM Marissa P wrote: Red Word that prompted transfer to Nurse Triage: Patient called saying both feet are swollen, no other symptoms. A little pain in both feet. Reason for Disposition  [1] MILD swelling of both ankles (i.e., pedal edema) AND [2] new-onset or worsening  Answer Assessment - Initial Assessment Questions 1. ONSET: "When did the swelling start?" (e.g., minutes, hours, days)     Since yesterday.  2. LOCATION: "What part of the leg is swollen?"  "Are both legs swollen or just one leg?"     Bilateral feet.  3. SEVERITY: "How bad is the swelling?" (e.g., localized; mild, moderate, severe)   - Localized: Small area of swelling localized to one leg.   - MILD pedal edema: Swelling limited to foot and ankle, pitting edema < 1/4 inch (6 mm) deep, rest and elevation eliminate most or all swelling.   - MODERATE edema: Swelling of lower leg to knee, pitting edema > 1/4 inch (6 mm) deep, rest and elevation only partially reduce swelling.   - SEVERE edema: Swelling extends above knee, facial or hand swelling present.      Puffiness to both feet. Mild pedal edema.  4. REDNESS: "Does the swelling look red or infected?"     Denies.  5. PAIN: "Is the swelling painful to touch?" If Yes, ask: "How painful is it?"   (Scale  1-10; mild, moderate or severe)     Mild pain.  6. FEVER: "Do you have a fever?" If Yes, ask: "What is it, how was it measured, and when did it start?"      Denies.  7. CAUSE: "What do you think is causing the leg swelling?"     Unsure.  8. MEDICAL HISTORY: "Do you have a history of blood clots (e.g., DVT), cancer, heart failure, kidney disease, or liver failure?"     Cancer.  9. RECURRENT SYMPTOM: "Have you had leg swelling before?" If Yes, ask: "When was the last time?" "What happened that time?"     Denies.  10. OTHER SYMPTOMS: "Do you have any other symptoms?" (e.g., chest pain, difficulty breathing)       Denies.  11. PREGNANCY: "Is there any chance you are pregnant?" "When was your last menstrual period?"       N/A.  Protocols used: Leg Swelling and Edema-A-AH

## 2023-11-26 ENCOUNTER — Ambulatory Visit: Admitting: Physician Assistant

## 2023-11-26 ENCOUNTER — Encounter: Payer: Self-pay | Admitting: Physician Assistant

## 2023-11-26 VITALS — BP 104/69 | HR 74 | Resp 16 | Ht 63.0 in | Wt 219.0 lb

## 2023-11-26 DIAGNOSIS — M7989 Other specified soft tissue disorders: Secondary | ICD-10-CM | POA: Diagnosis not present

## 2023-11-26 DIAGNOSIS — R600 Localized edema: Secondary | ICD-10-CM | POA: Diagnosis not present

## 2023-11-26 DIAGNOSIS — R197 Diarrhea, unspecified: Secondary | ICD-10-CM

## 2023-11-26 NOTE — Progress Notes (Signed)
 Established patient visit  Patient: Carrie Alvarez   DOB: [birth year]   73 y.o. Female  MRN: [id number] Visit Date: 11/26/2023  Today's healthcare provider: Blane Bunting, PA-C   Chief Complaint  Patient presents with   Edema    Edema    Subjective       Discussed the use of AI scribe software for clinical note transcription with the patient, who gave verbal consent to proceed.  History of Present Illness Carrie Alvarez "Carrie Alvarez" is a 73 year old female who presents with ankle swelling.  She has bilateral ankle swelling that began on 12/26/23, with more pronounced swelling on one side. The swelling is significantly more than usual. She started Lasix  earlier this week, which has led to some improvement. She takes Lasix  once daily, with the dose discussed as either 20 mg or 40 mg. Despite improvement, she experiences pain with pressure on the ankle and difficulty bearing weight and walking.  She has constant tingling in her feet and recently developed knee pain following the ankle swelling. She denies recent overuse or twisting of the knee.  She experiences diarrhea shortly after eating, occurring with every meal, and denies recent antibiotic or ibuprofen use.       11/18/2023    9:22 AM 01/13/2023    2:19 PM 03/18/2022   10:12 AM  Depression screen PHQ 2/9  Decreased Interest 0 0 0  Down, Depressed, Hopeless 0 0 0  PHQ - 2 Score 0 0 0  Altered sleeping 0  1  Tired, decreased energy 3  2  Change in appetite 0  0  Feeling bad or failure about yourself  0  0  Trouble concentrating 0  0  Moving slowly or fidgety/restless 0  1  Suicidal thoughts 0  0  PHQ-9 Score 3  4  Difficult doing work/chores Not difficult at all  Not difficult at all      11/18/2023    9:23 AM  GAD 7 : Generalized Anxiety Score  Nervous, Anxious, on Edge 0  Control/stop worrying 0  Worry too much - different things 0  Trouble relaxing 1  Restless 0  Easily annoyed or irritable 0  Afraid -  awful might happen 0  Total GAD 7 Score 1  Anxiety Difficulty Somewhat difficult    Medications: Outpatient Medications Prior to Visit  Medication Sig   albuterol  (VENTOLIN  HFA) 108 (90 Base) MCG/ACT inhaler Inhale 2 puffs into the lungs every 6 (six) hours as needed for wheezing or shortness of breath.   amLODipine  (NORVASC ) 5 MG tablet Take 1 tablet by mouth once daily   atorvastatin  (LIPITOR) 80 MG tablet Take 1 tablet (80 mg total) by mouth daily.   baclofen  (LIORESAL ) 10 MG tablet Take 1 tablet (10 mg total) by mouth 2 (two) times daily.   cetirizine  (ZYRTEC ) 10 MG tablet Take 1 tablet (10 mg total) by mouth daily at 6 (six) AM.   cholecalciferol  (VITAMIN D ) 1000 units tablet Take 1,000 Units by mouth daily.   fluticasone  (FLONASE ) 50 MCG/ACT nasal spray USE 2 SPRAY(S) IN EACH NOSTRIL ONCE DAILY   furosemide  (LASIX ) 20 MG tablet Take 1 tablet (20 mg total) by mouth daily as needed for fluid.   gabapentin  (NEURONTIN ) 600 MG tablet Take 1 tablet (600 mg total) by mouth 3 (three) times daily.   NARCAN 4 MG/0.1ML LIQD nasal spray kit Place 1 spray into the nose.    ondansetron  (ZOFRAN -ODT) 4 MG disintegrating tablet Take 1 tablet (4  mg total) by mouth every 8 (eight) hours as needed for nausea or vomiting.   Oxycodone  HCl 10 MG TABS LIMIT ONE HALF TO ONE TABLET BY MOUTH 3 TO 5 TIMES DAILY IF TOLERATED   PREVIDENT 5000 DRY MOUTH 1.1 % GEL dental gel Place onto teeth at bedtime.   pyridoxine  (B-6) 100 MG tablet Take 100 mg by mouth daily.   sertraline  (ZOLOFT ) 100 MG tablet Take 2 tablets by mouth once daily   traZODone  (DESYREL ) 50 MG tablet TAKE 1 TO 2 TABLETS BY MOUTH AT BEDTIME AS NEEDED FOR SLEEP   vitamin B-12 (CYANOCOBALAMIN ) 500 MCG tablet Take 500 mcg by mouth daily.   Facility-Administered Medications Prior to Visit  Medication Dose Route Frequency Provider   heparin  lock flush 100 unit/mL  500 Units Intravenous Once Finnegan, Timothy J, MD   heparin  lock flush 100 unit/mL  500  Units Intravenous Once Finnegan, Timothy J, MD   sodium chloride  flush (NS) 0.9 % injection 10 mL  10 mL Intravenous PRN Finnegan, Timothy J, MD    Review of Systems All negative Except see HPI       Objective    BP 104/69 (BP Location: Right Arm, Patient Position: Sitting, Cuff Size: Normal)   Pulse 74   Resp 16   Ht 5\' 3"  (1.6 m)   Wt 219 lb (99.3 kg)   SpO2 99%   BMI 38.79 kg/m     Physical Exam Vitals reviewed.  Constitutional:      General: She is not in acute distress.    Appearance: Normal appearance. She is well-developed. She is not diaphoretic.  HENT:     Head: Normocephalic and atraumatic.  Eyes:     General: No scleral icterus.    Conjunctiva/sclera: Conjunctivae normal.  Neck:     Thyroid : No thyromegaly.  Cardiovascular:     Rate and Rhythm: Normal rate and regular rhythm.     Pulses: Normal pulses.     Heart sounds: Normal heart sounds. No murmur heard. Pulmonary:     Effort: Pulmonary effort is normal. No respiratory distress.     Breath sounds: Normal breath sounds. No wheezing, rhonchi or rales.  Musculoskeletal:     Cervical back: Neck supple.     Right lower leg: Edema present.     Left lower leg: Edema present.  Lymphadenopathy:     Cervical: No cervical adenopathy.  Skin:    General: Skin is warm and dry.     Findings: No rash.  Neurological:     Mental Status: She is alert and oriented to person, place, and time. Mental status is at baseline.  Psychiatric:        Mood and Affect: Mood normal.        Behavior: Behavior normal.      No results found for any visits on 11/26/23.      Assessment & Plan Peripheral edema Leg swelling/pedal edema, bl Chronic Bilateral ankle swelling improved with Lasix . - Continue Lasix  40 mg daily. - Advise use of compression stockings. - Recommend wearing loose-fitting shoes. - Schedule follow-up in 2-3 weeks with Dr. Shann Darnel. - Advise to return if pain, swelling worsens, or redness  develops.  Diarrhea Diarrhea postprandial, no recent antibiotic or ibuprofen use. - Advise bland diet. - Increase water  intake. - Evaluate further if diarrhea persists at next visit. Will follow-up  Anemia Low hemoglobin from 5/9, no symptoms currently. - Encourage a healthy diet. - Recheck blood work at next visit or before  the next visit.  Peripheral neuropathy Chronic Consistent tingling in feet.  Colon cancer - next colonoscopy due 2028.  Breast cancer Completed chemotherapy in 2016, regular oncology follow-up.  General Health Maintenance Discussed need for bone density scan and shingles vaccination, history of shingles twice. - Schedule DEXA scan for bone density. - Recommend shingles vaccination. - Discuss tetanus vaccination.   No orders of the defined types were placed in this encounter.   No follow-ups on file.   The patient was advised to call back or seek an in-person evaluation if the symptoms worsen or if the condition fails to improve as anticipated.  I discussed the assessment and treatment plan with the patient. The patient was provided an opportunity to ask questions and all were answered. The patient agreed with the plan and demonstrated an understanding of the instructions.  I, Leotha Westermeyer, PA-C have reviewed all documentation for this visit. The documentation on 11/26/2023  for the exam, diagnosis, procedures, and orders are all accurate and complete.  Blane Bunting, [facility], MMS [facility] [phone] (phone) [phone] (fax)  [facility]

## 2023-11-27 DIAGNOSIS — R600 Localized edema: Secondary | ICD-10-CM | POA: Insufficient documentation

## 2023-11-27 DIAGNOSIS — R197 Diarrhea, unspecified: Secondary | ICD-10-CM | POA: Insufficient documentation

## 2023-12-08 ENCOUNTER — Other Ambulatory Visit: Payer: Self-pay | Admitting: Family Medicine

## 2023-12-08 DIAGNOSIS — J40 Bronchitis, not specified as acute or chronic: Secondary | ICD-10-CM

## 2023-12-09 DIAGNOSIS — Z5181 Encounter for therapeutic drug level monitoring: Secondary | ICD-10-CM | POA: Diagnosis not present

## 2023-12-09 DIAGNOSIS — M5459 Other low back pain: Secondary | ICD-10-CM | POA: Diagnosis not present

## 2023-12-09 DIAGNOSIS — M25561 Pain in right knee: Secondary | ICD-10-CM | POA: Diagnosis not present

## 2023-12-09 DIAGNOSIS — M25562 Pain in left knee: Secondary | ICD-10-CM | POA: Diagnosis not present

## 2023-12-09 DIAGNOSIS — Z79891 Long term (current) use of opiate analgesic: Secondary | ICD-10-CM | POA: Diagnosis not present

## 2023-12-09 DIAGNOSIS — M51362 Other intervertebral disc degeneration, lumbar region with discogenic back pain and lower extremity pain: Secondary | ICD-10-CM | POA: Diagnosis not present

## 2023-12-09 DIAGNOSIS — G894 Chronic pain syndrome: Secondary | ICD-10-CM | POA: Diagnosis not present

## 2023-12-09 DIAGNOSIS — G629 Polyneuropathy, unspecified: Secondary | ICD-10-CM | POA: Diagnosis not present

## 2023-12-10 ENCOUNTER — Encounter: Payer: Self-pay | Admitting: Family Medicine

## 2023-12-10 ENCOUNTER — Ambulatory Visit
Admission: RE | Admit: 2023-12-10 | Discharge: 2023-12-10 | Disposition: A | Discharge status: Home / Self Care | Source: Ambulatory Visit | Attending: Family Medicine | Admitting: Family Medicine

## 2023-12-10 ENCOUNTER — Ambulatory Visit (INDEPENDENT_AMBULATORY_CARE_PROVIDER_SITE_OTHER): Admitting: Family Medicine

## 2023-12-10 ENCOUNTER — Other Ambulatory Visit: Payer: Self-pay

## 2023-12-10 ENCOUNTER — Ambulatory Visit
Admission: RE | Admit: 2023-12-10 | Discharge: 2023-12-10 | Disposition: A | Discharge status: Home / Self Care | Attending: Family Medicine | Admitting: Family Medicine

## 2023-12-10 ENCOUNTER — Telehealth: Payer: Self-pay | Admitting: Family Medicine

## 2023-12-10 VITALS — BP 110/75 | HR 88 | Ht 63.0 in | Wt 206.9 lb

## 2023-12-10 DIAGNOSIS — E559 Vitamin D deficiency, unspecified: Secondary | ICD-10-CM

## 2023-12-10 DIAGNOSIS — R0602 Shortness of breath: Secondary | ICD-10-CM | POA: Diagnosis not present

## 2023-12-10 DIAGNOSIS — R051 Acute cough: Secondary | ICD-10-CM

## 2023-12-10 DIAGNOSIS — I77810 Thoracic aortic ectasia: Secondary | ICD-10-CM | POA: Diagnosis not present

## 2023-12-10 DIAGNOSIS — I10 Essential (primary) hypertension: Secondary | ICD-10-CM

## 2023-12-10 DIAGNOSIS — R0609 Other forms of dyspnea: Secondary | ICD-10-CM

## 2023-12-10 DIAGNOSIS — M7989 Other specified soft tissue disorders: Secondary | ICD-10-CM | POA: Diagnosis not present

## 2023-12-10 MED ORDER — AZITHROMYCIN 250 MG PO TABS: ORAL_TABLET | ORAL | 0 refills | Status: AC

## 2023-12-10 MED ORDER — AMLODIPINE BESYLATE 5 MG PO TABS: 5.0000 mg | ORAL_TABLET | Freq: Every day | ORAL | 0 refills | Status: DC

## 2023-12-10 MED ORDER — PROMETHAZINE-DM 6.25-15 MG/5ML PO SYRP: 5.0000 mL | ORAL_SOLUTION | Freq: Four times a day (QID) | ORAL | 0 refills | Status: AC | PRN

## 2023-12-10 NOTE — Telephone Encounter (Signed)
 Converted to RF req

## 2023-12-10 NOTE — Telephone Encounter (Signed)
 Patient had appointment today with provider and requesting refill amLODipine  (NORVASC ) 5 MG tablet  Please advise

## 2023-12-11 LAB — CBC
Hematocrit: 36.6 % (ref 34.0–46.6)
Hemoglobin: 12 g/dL (ref 11.1–15.9)
MCH: 28.6 pg (ref 26.6–33.0)
MCHC: 32.8 g/dL (ref 31.5–35.7)
MCV: 87 fL (ref 79–97)
Platelets: 180 10*3/uL (ref 150–450)
RBC: 4.2 x10E6/uL (ref 3.77–5.28)
RDW: 13.4 % (ref 11.7–15.4)
WBC: 8.4 10*3/uL (ref 3.4–10.8)

## 2023-12-11 LAB — RENAL FUNCTION PANEL
Albumin: 4.2 g/dL (ref 3.8–4.8)
BUN/Creatinine Ratio: 10 — ABNORMAL LOW (ref 12–28)
BUN: 12 mg/dL (ref 8–27)
CO2: 26 mmol/L (ref 20–29)
Calcium: 9.8 mg/dL (ref 8.7–10.3)
Chloride: 103 mmol/L (ref 96–106)
Creatinine, Ser: 1.2 mg/dL — ABNORMAL HIGH (ref 0.57–1.00)
Glucose: 70 mg/dL (ref 70–99)
Phosphorus: 3.2 mg/dL (ref 3.0–4.3)
Potassium: 4.1 mmol/L (ref 3.5–5.2)
Sodium: 144 mmol/L (ref 134–144)
eGFR: 48 mL/min/{1.73_m2} — ABNORMAL LOW (ref >59)

## 2023-12-11 LAB — VITAMIN D 25 HYDROXY (VIT D DEFICIENCY, FRACTURES): Vit D, 25-Hydroxy: 42.3 ng/mL (ref 30.0–100.0)

## 2023-12-11 LAB — BRAIN NATRIURETIC PEPTIDE: BNP: 25.5 pg/mL (ref 0.0–100.0)

## 2023-12-11 LAB — TSH: TSH: 2.02 u[IU]/mL (ref 0.450–4.500)

## 2023-12-12 ENCOUNTER — Other Ambulatory Visit: Payer: Self-pay | Admitting: Physician Assistant

## 2023-12-12 ENCOUNTER — Ambulatory Visit: Payer: Self-pay | Admitting: Family Medicine

## 2023-12-12 DIAGNOSIS — J3089 Other allergic rhinitis: Secondary | ICD-10-CM

## 2024-01-02 ENCOUNTER — Encounter: Payer: Self-pay | Admitting: Oncology

## 2024-01-02 NOTE — Progress Notes (Signed)
 Established patient visit   Patient: Carrie Alvarez   DOB: [birth year]   74 y.o. Female  MRN: [id number] Visit Date: 12/10/2023  Today's healthcare provider: Nancyann Perry, MD   Chief Complaint  Patient presents with   Follow-up   Leg Swelling    Patient last seen 11/26/23 for bilateral ankle swelling   Subjective    Discussed the use of AI scribe software for clinical note transcription with the patient, who gave verbal consent to proceed.  History of Present Illness   Carrie Alvarez is a 73 year old female who presents with new onset cough and breathing difficulties.  She has been experiencing a non-productive cough and difficulty breathing for the past two days. The cough does not disturb her sleep. No fever, chills, sweats, or sputum production. She also reports no chest pain or heart palpitations.  She has a history of peripheral edema, which has improved with the use of furosemide , a diuretic she restarted a couple of weeks ago. She denies any issues with this medication and notes that the swelling is mostly resolved.  She uses an albuterol  inhaler and indicates she may need a refill. She has not been taking any over-the-counter cough medications and has not been around anyone who is sick.  Socially, she has been staying at home and continues to use the [facility] on [address] for her prescriptions.       Medications: Outpatient Medications Prior to Visit  Medication Sig   albuterol  (VENTOLIN  HFA) 108 (90 Base) MCG/ACT inhaler INHALE 2 PUFFS BY MOUTH EVERY 6 HOURS AS NEEDED FOR WHEEZING FOR SHORTNESS OF BREATH   atorvastatin  (LIPITOR) 80 MG tablet Take 1 tablet (80 mg total) by mouth daily.   baclofen  (LIORESAL ) 10 MG tablet Take 1 tablet (10 mg total) by mouth 2 (two) times daily.   cholecalciferol  (VITAMIN D ) 1000 units tablet Take 1,000 Units by mouth daily.   fluticasone  (FLONASE ) 50 MCG/ACT nasal spray USE 2 SPRAY(S) IN EACH  NOSTRIL ONCE DAILY   furosemide  (LASIX ) 20 MG tablet Take 1 tablet (20 mg total) by mouth daily as needed for fluid.   gabapentin  (NEURONTIN ) 600 MG tablet Take 1 tablet (600 mg total) by mouth 3 (three) times daily.   NARCAN 4 MG/0.1ML LIQD nasal spray kit Place 1 spray into the nose.    ondansetron  (ZOFRAN -ODT) 4 MG disintegrating tablet Take 1 tablet (4 mg total) by mouth every 8 (eight) hours as needed for nausea or vomiting.   Oxycodone  HCl 10 MG TABS LIMIT ONE HALF TO ONE TABLET BY MOUTH 3 TO 5 TIMES DAILY IF TOLERATED   PREVIDENT 5000 DRY MOUTH 1.1 % GEL dental gel Place onto teeth at bedtime.   pyridoxine  (B-6) 100 MG tablet Take 100 mg by mouth daily.   sertraline  (ZOLOFT ) 100 MG tablet Take 2 tablets by mouth once daily   traZODone  (DESYREL ) 50 MG tablet TAKE 1 TO 2 TABLETS BY MOUTH AT BEDTIME AS NEEDED FOR SLEEP   vitamin B-12 (CYANOCOBALAMIN ) 500 MCG tablet Take 500 mcg by mouth daily.   amLODipine  (NORVASC ) 5 MG tablet Take 1 tablet by mouth once daily   cetirizine  (ZYRTEC ) 10 MG tablet Take 1 tablet (10 mg total) by mouth daily at 6 (six) AM.   Facility-Administered Medications Prior to Visit  Medication Dose Route Frequency Provider   heparin  lock flush 100 unit/mL  500 Units Intravenous Once Finnegan, Timothy J, MD   heparin  lock flush 100  unit/mL  500 Units Intravenous Once Finnegan, Timothy J, MD   sodium chloride  flush (NS) 0.9 % injection 10 mL  10 mL Intravenous PRN Finnegan, Timothy J, MD   Review of Systems  Constitutional:  Negative for appetite change, chills, fatigue and fever.  Respiratory:  Negative for chest tightness and shortness of breath.   Cardiovascular:  Negative for chest pain and palpitations.  Gastrointestinal:  Negative for abdominal pain, nausea and vomiting.  Neurological:  Negative for dizziness and weakness.       Objective    BP 110/75 (BP Location: Left Arm, Patient Position: Sitting, Cuff Size: Large)   Pulse 88   Ht 5' 3 (1.6 m)   Wt  206 lb 14.4 oz (93.8 kg)   SpO2 98%   BMI 36.65 kg/m   Physical Exam   General: Appearance:    Mildly obese female in no acute distress  Eyes:    PERRL, conjunctiva/corneas clear, EOM's intact       Lungs:     Diffuse expiratory wheezes, no rales, respirations unlabored  Heart:    Normal heart rate. Normal rhythm. No murmurs, rubs, or gallops.    MS:   All extremities are intact.    Neurologic:   Awake, alert, oriented x 3. No apparent focal neurological defect.        Assessment & Plan       Acute bronchitis New cough with wheezing and dyspnea. Differential includes bronchitis and possible pulmonary edema. Albuterol  inhaler needed. Considering antibiotics. Chest x-ray to rule out pulmonary edema. - Refill albuterol  inhaler. - Prescribe azithromycin . - [facility] at [facility]. - Prescribe sedative cough medicine for nighttime.  Peripheral edema Previously present edema improved with mild residual puffiness. On furosemide . Monitor renal function and electrolytes due to diuretic use. - Order blood work to monitor renal function, electrolytes, and blood counts.    No follow-ups on file.     Nancyann Perry, MD  [facility] [phone] (phone) [phone] (fax)  [facility]

## 2024-02-11 ENCOUNTER — Ambulatory Visit: Payer: Self-pay

## 2024-02-11 NOTE — Telephone Encounter (Signed)
 Called patient, no answer and no VM available will retry.

## 2024-02-11 NOTE — Telephone Encounter (Signed)
 Called patient and sent patient a MyChart message relaying this information to her.

## 2024-02-11 NOTE — Telephone Encounter (Signed)
 FYI Only or Action Required?: FYI only for provider.  Patient was last seen in primary care on 12/10/2023 by Gasper Nancyann BRAVO, MD.  Called Nurse Triage reporting Leg Swelling.  Symptoms began several days ago.  Interventions attempted: Rest, hydration, or home remedies and Other: elevation.  Symptoms are: unchanged.  Triage Disposition: See PCP When Office is Open (Within 3 Days)  Patient/caregiver understands and will follow disposition?: Yes        Copied from CRM [id number]. Topic: Clinical - Red Word Triage >> Feb 11, 2024 11:35 AM Larissa RAMAN wrote: [facility] that prompted transfer to Nurse Triage: B/L leg and foot swelling Reason for Disposition  [1] MILD swelling of both ankles (i.e., pedal edema) AND [2] new-onset or getting worse  Answer Assessment - Initial Assessment Questions 1. ONSET: When did the swelling start? (e.g., minutes, hours, days)     Tuesday or Wednesday 2. LOCATION: What part of the leg is swollen?  Are both legs swollen or just one leg?     Both  3. SEVERITY: How bad is the swelling? (e.g., localized; mild, moderate, severe)     Just above the ankle 4. REDNESS: Is there redness or signs of infection?     no 5. PAIN: Is the swelling painful to touch? If Yes, ask: How painful is it?   (Scale 1-10; mild, moderate or severe)     7/10 6. FEVER: Do you have a fever? If Yes, ask: What is it, how was it measured, and when did it start?      no 7. CAUSE: What do you think is causing the leg swelling?     Unknown 8. MEDICAL HISTORY: Do you have a history of blood clots (e.g., DVT), cancer, heart failure, kidney disease, or liver failure?     no 9. RECURRENT SYMPTOM: Have you had leg swelling before? If Yes, ask: When was the last time? What happened that time?     no 10. OTHER SYMPTOMS: Do you have any other symptoms? (e.g., chest pain, difficulty breathing)       no  Protocols used: Leg Swelling and Edema-A-AH

## 2024-02-11 NOTE — Telephone Encounter (Signed)
Needs to be seen or go to urgent care.

## 2024-02-20 ENCOUNTER — Other Ambulatory Visit: Payer: Self-pay | Admitting: Family Medicine

## 2024-02-20 DIAGNOSIS — F5101 Primary insomnia: Secondary | ICD-10-CM

## 2024-03-02 DIAGNOSIS — M25561 Pain in right knee: Secondary | ICD-10-CM | POA: Diagnosis not present

## 2024-03-02 DIAGNOSIS — M5459 Other low back pain: Secondary | ICD-10-CM | POA: Diagnosis not present

## 2024-03-02 DIAGNOSIS — G894 Chronic pain syndrome: Secondary | ICD-10-CM | POA: Diagnosis not present

## 2024-03-02 DIAGNOSIS — F5101 Primary insomnia: Secondary | ICD-10-CM | POA: Diagnosis not present

## 2024-03-02 DIAGNOSIS — Z79891 Long term (current) use of opiate analgesic: Secondary | ICD-10-CM | POA: Diagnosis not present

## 2024-03-02 DIAGNOSIS — M25562 Pain in left knee: Secondary | ICD-10-CM | POA: Diagnosis not present

## 2024-03-02 DIAGNOSIS — Z5181 Encounter for therapeutic drug level monitoring: Secondary | ICD-10-CM | POA: Diagnosis not present

## 2024-03-02 DIAGNOSIS — M62830 Muscle spasm of back: Secondary | ICD-10-CM | POA: Diagnosis not present

## 2024-03-10 DIAGNOSIS — G894 Chronic pain syndrome: Secondary | ICD-10-CM | POA: Diagnosis not present

## 2024-03-10 DIAGNOSIS — M5459 Other low back pain: Secondary | ICD-10-CM | POA: Diagnosis not present

## 2024-03-10 DIAGNOSIS — Z79891 Long term (current) use of opiate analgesic: Secondary | ICD-10-CM | POA: Diagnosis not present

## 2024-03-30 DIAGNOSIS — Z5181 Encounter for therapeutic drug level monitoring: Secondary | ICD-10-CM | POA: Diagnosis not present

## 2024-03-30 DIAGNOSIS — M5459 Other low back pain: Secondary | ICD-10-CM | POA: Diagnosis not present

## 2024-03-30 DIAGNOSIS — M25561 Pain in right knee: Secondary | ICD-10-CM | POA: Diagnosis not present

## 2024-03-30 DIAGNOSIS — G894 Chronic pain syndrome: Secondary | ICD-10-CM | POA: Diagnosis not present

## 2024-03-30 DIAGNOSIS — M25562 Pain in left knee: Secondary | ICD-10-CM | POA: Diagnosis not present

## 2024-03-30 DIAGNOSIS — Z79891 Long term (current) use of opiate analgesic: Secondary | ICD-10-CM | POA: Diagnosis not present

## 2024-04-03 ENCOUNTER — Emergency Department

## 2024-04-03 ENCOUNTER — Inpatient Hospital Stay
Admission: EM | Admit: 2024-04-03 | Discharge: 2024-04-05 | DRG: 392 | Disposition: A | Discharge status: Home / Self Care | Attending: Hospitalist | Admitting: Hospitalist

## 2024-04-03 ENCOUNTER — Other Ambulatory Visit: Payer: Self-pay

## 2024-04-03 ENCOUNTER — Encounter: Payer: Self-pay | Admitting: Oncology

## 2024-04-03 ENCOUNTER — Ambulatory Visit: Payer: Self-pay

## 2024-04-03 DIAGNOSIS — G8929 Other chronic pain: Secondary | ICD-10-CM | POA: Diagnosis present

## 2024-04-03 DIAGNOSIS — Z8249 Family history of ischemic heart disease and other diseases of the circulatory system: Secondary | ICD-10-CM

## 2024-04-03 DIAGNOSIS — K529 Noninfective gastroenteritis and colitis, unspecified: Secondary | ICD-10-CM | POA: Diagnosis not present

## 2024-04-03 DIAGNOSIS — E7800 Pure hypercholesterolemia, unspecified: Secondary | ICD-10-CM | POA: Diagnosis present

## 2024-04-03 DIAGNOSIS — Z8542 Personal history of malignant neoplasm of other parts of uterus: Secondary | ICD-10-CM

## 2024-04-03 DIAGNOSIS — M5416 Radiculopathy, lumbar region: Secondary | ICD-10-CM | POA: Diagnosis not present

## 2024-04-03 DIAGNOSIS — E162 Hypoglycemia, unspecified: Secondary | ICD-10-CM | POA: Diagnosis present

## 2024-04-03 DIAGNOSIS — Z8614 Personal history of Methicillin resistant Staphylococcus aureus infection: Secondary | ICD-10-CM | POA: Diagnosis not present

## 2024-04-03 DIAGNOSIS — Z87891 Personal history of nicotine dependence: Secondary | ICD-10-CM | POA: Diagnosis not present

## 2024-04-03 DIAGNOSIS — Z825 Family history of asthma and other chronic lower respiratory diseases: Secondary | ICD-10-CM

## 2024-04-03 DIAGNOSIS — Z9049 Acquired absence of other specified parts of digestive tract: Secondary | ICD-10-CM | POA: Diagnosis not present

## 2024-04-03 DIAGNOSIS — R197 Diarrhea, unspecified: Secondary | ICD-10-CM | POA: Diagnosis not present

## 2024-04-03 DIAGNOSIS — I6782 Cerebral ischemia: Secondary | ICD-10-CM | POA: Diagnosis not present

## 2024-04-03 DIAGNOSIS — R42 Dizziness and giddiness: Principal | ICD-10-CM

## 2024-04-03 DIAGNOSIS — Z961 Presence of intraocular lens: Secondary | ICD-10-CM | POA: Diagnosis present

## 2024-04-03 DIAGNOSIS — Z9842 Cataract extraction status, left eye: Secondary | ICD-10-CM

## 2024-04-03 DIAGNOSIS — J309 Allergic rhinitis, unspecified: Secondary | ICD-10-CM | POA: Diagnosis not present

## 2024-04-03 DIAGNOSIS — Z853 Personal history of malignant neoplasm of breast: Secondary | ICD-10-CM

## 2024-04-03 DIAGNOSIS — Z90710 Acquired absence of both cervix and uterus: Secondary | ICD-10-CM

## 2024-04-03 DIAGNOSIS — K219 Gastro-esophageal reflux disease without esophagitis: Secondary | ICD-10-CM | POA: Diagnosis not present

## 2024-04-03 DIAGNOSIS — R109 Unspecified abdominal pain: Secondary | ICD-10-CM | POA: Diagnosis not present

## 2024-04-03 DIAGNOSIS — F32A Depression, unspecified: Secondary | ICD-10-CM | POA: Diagnosis present

## 2024-04-03 DIAGNOSIS — Z9841 Cataract extraction status, right eye: Secondary | ICD-10-CM

## 2024-04-03 DIAGNOSIS — Z79899 Other long term (current) drug therapy: Secondary | ICD-10-CM

## 2024-04-03 DIAGNOSIS — Z800 Family history of malignant neoplasm of digestive organs: Secondary | ICD-10-CM | POA: Diagnosis not present

## 2024-04-03 DIAGNOSIS — Z9221 Personal history of antineoplastic chemotherapy: Secondary | ICD-10-CM | POA: Diagnosis not present

## 2024-04-03 DIAGNOSIS — Z85038 Personal history of other malignant neoplasm of large intestine: Secondary | ICD-10-CM | POA: Diagnosis not present

## 2024-04-03 DIAGNOSIS — Z8673 Personal history of transient ischemic attack (TIA), and cerebral infarction without residual deficits: Secondary | ICD-10-CM | POA: Diagnosis not present

## 2024-04-03 DIAGNOSIS — Z803 Family history of malignant neoplasm of breast: Secondary | ICD-10-CM | POA: Diagnosis not present

## 2024-04-03 DIAGNOSIS — I10 Essential (primary) hypertension: Secondary | ICD-10-CM | POA: Diagnosis present

## 2024-04-03 DIAGNOSIS — R55 Syncope and collapse: Secondary | ICD-10-CM | POA: Diagnosis not present

## 2024-04-03 DIAGNOSIS — Q048 Other specified congenital malformations of brain: Secondary | ICD-10-CM | POA: Diagnosis not present

## 2024-04-03 LAB — COMPREHENSIVE METABOLIC PANEL WITH GFR
ALT: 16 U/L (ref 0–44)
AST: 25 U/L (ref 15–41)
Albumin: 4.1 g/dL (ref 3.5–5.0)
Alkaline Phosphatase: 61 U/L (ref 38–126)
Anion gap: 17 — ABNORMAL HIGH (ref 5–15)
BUN: 13 mg/dL (ref 8–23)
CO2: 20 mmol/L — ABNORMAL LOW (ref 22–32)
Calcium: 10 mg/dL (ref 8.9–10.3)
Chloride: 107 mmol/L (ref 98–111)
Creatinine, Ser: 0.96 mg/dL (ref 0.44–1.00)
GFR, Estimated: >60 mL/min (ref >60)
Glucose, Bld: 91 mg/dL (ref 70–99)
Potassium: 3.4 mmol/L — ABNORMAL LOW (ref 3.5–5.1)
Sodium: 144 mmol/L (ref 135–145)
Total Bilirubin: 2 mg/dL — ABNORMAL HIGH (ref 0.0–1.2)
Total Protein: 7.4 g/dL (ref 6.5–8.1)

## 2024-04-03 LAB — CBC
HCT: 38.1 % (ref 36.0–46.0)
Hemoglobin: 13.5 g/dL (ref 12.0–15.0)
MCH: 28.5 pg (ref 26.0–34.0)
MCHC: 35.4 g/dL (ref 30.0–36.0)
MCV: 80.5 fL (ref 80.0–100.0)
Platelets: 241 K/uL (ref 150–400)
RBC: 4.73 MIL/uL (ref 3.87–5.11)
RDW: 14.1 % (ref 11.5–15.5)
WBC: 9.2 K/uL (ref 4.0–10.5)
nRBC: 0 % (ref 0.0–0.2)

## 2024-04-03 LAB — TROPONIN I (HIGH SENSITIVITY): Troponin I (High Sensitivity): 6 ng/L (ref <18)

## 2024-04-03 MED ORDER — VITAMIN D 25 MCG (1000 UNIT) PO TABS
1000.0000 [IU] | ORAL_TABLET | Freq: Every day | ORAL | Status: DC
  Administered 2024-04-03 – 2024-04-05 (×3): 1000 [IU] via ORAL
  Filled 2024-04-03 (×3): qty 1

## 2024-04-03 MED ORDER — PIPERACILLIN-TAZOBACTAM 3.375 G IVPB 30 MIN
3.3750 g | Freq: Once | INTRAVENOUS | Status: AC
  Administered 2024-04-03: 3.375 g via INTRAVENOUS
  Filled 2024-04-03: qty 50

## 2024-04-03 MED ORDER — ACETAMINOPHEN 650 MG RE SUPP: 650.0000 mg | Freq: Four times a day (QID) | RECTAL | Status: DC | PRN

## 2024-04-03 MED ORDER — ATORVASTATIN CALCIUM 80 MG PO TABS
80.0000 mg | ORAL_TABLET | Freq: Every day | ORAL | Status: DC
  Administered 2024-04-03 – 2024-04-05 (×3): 80 mg via ORAL
  Filled 2024-04-03: qty 4
  Filled 2024-04-03 (×2): qty 1

## 2024-04-03 MED ORDER — ONDANSETRON HCL 4 MG PO TABS: 4.0000 mg | ORAL_TABLET | Freq: Four times a day (QID) | ORAL | 0 refills | Status: DC | PRN

## 2024-04-03 MED ORDER — SODIUM CHLORIDE 0.9 % IV SOLN
2.0000 g | INTRAVENOUS | Status: DC
  Administered 2024-04-04 – 2024-04-05 (×2): 2 g via INTRAVENOUS
  Filled 2024-04-03 (×2): qty 20

## 2024-04-03 MED ORDER — TRAZODONE HCL 50 MG PO TABS: 50.0000 mg | ORAL_TABLET | Freq: Every evening | ORAL | Status: DC | PRN

## 2024-04-03 MED ORDER — GADOBUTROL 1 MMOL/ML IV SOLN
8.0000 mL | Freq: Once | INTRAVENOUS | Status: AC | PRN
  Administered 2024-04-03: 8 mL via INTRAVENOUS

## 2024-04-03 MED ORDER — FLUTICASONE PROPIONATE 50 MCG/ACT NA SUSP
2.0000 | Freq: Every day | NASAL | Status: DC
  Administered 2024-04-04 – 2024-04-05 (×2): 2 via NASAL
  Filled 2024-04-03 (×2): qty 16

## 2024-04-03 MED ORDER — AMLODIPINE BESYLATE 5 MG PO TABS
5.0000 mg | ORAL_TABLET | Freq: Every day | ORAL | Status: DC
  Administered 2024-04-03 – 2024-04-05 (×3): 5 mg via ORAL
  Filled 2024-04-03 (×3): qty 1

## 2024-04-03 MED ORDER — ONDANSETRON HCL 4 MG PO TABS: 4.0000 mg | ORAL_TABLET | Freq: Four times a day (QID) | ORAL | Status: DC | PRN

## 2024-04-03 MED ORDER — SERTRALINE HCL 50 MG PO TABS
200.0000 mg | ORAL_TABLET | Freq: Every day | ORAL | Status: DC
  Administered 2024-04-03 – 2024-04-05 (×3): 200 mg via ORAL
  Filled 2024-04-03 (×3): qty 4

## 2024-04-03 MED ORDER — MORPHINE SULFATE (PF) 2 MG/ML IV SOLN: 2.0000 mg | INTRAVENOUS | Status: DC | PRN

## 2024-04-03 MED ORDER — METRONIDAZOLE 500 MG/100ML IV SOLN
500.0000 mg | Freq: Two times a day (BID) | INTRAVENOUS | Status: DC
Start: 2024-04-04 — End: 2024-04-05
  Administered 2024-04-04 – 2024-04-05 (×4): 500 mg via INTRAVENOUS
  Filled 2024-04-03 (×5): qty 100

## 2024-04-03 MED ORDER — VITAMIN B-12 1000 MCG PO TABS
500.0000 ug | ORAL_TABLET | Freq: Every day | ORAL | Status: DC
  Administered 2024-04-03 – 2024-04-05 (×3): 500 ug via ORAL
  Filled 2024-04-03 (×3): qty 1

## 2024-04-03 MED ORDER — MECLIZINE HCL 25 MG PO TABS
25.0000 mg | ORAL_TABLET | Freq: Once | ORAL | Status: AC
  Administered 2024-04-03: 25 mg via ORAL
  Filled 2024-04-03: qty 1

## 2024-04-03 MED ORDER — BACLOFEN 10 MG PO TABS
10.0000 mg | ORAL_TABLET | Freq: Two times a day (BID) | ORAL | Status: DC
  Administered 2024-04-03 – 2024-04-05 (×4): 10 mg via ORAL
  Filled 2024-04-03 (×4): qty 1

## 2024-04-03 MED ORDER — OXYCODONE HCL 5 MG PO TABS: 5.0000 mg | ORAL_TABLET | ORAL | Status: DC | PRN

## 2024-04-03 MED ORDER — ONDANSETRON HCL 4 MG/2ML IJ SOLN
4.0000 mg | Freq: Once | INTRAMUSCULAR | Status: AC
  Administered 2024-04-03: 4 mg via INTRAVENOUS
  Filled 2024-04-03: qty 2

## 2024-04-03 MED ORDER — ACETAMINOPHEN 325 MG PO TABS
650.0000 mg | ORAL_TABLET | Freq: Four times a day (QID) | ORAL | Status: DC | PRN
  Administered 2024-04-04: 650 mg via ORAL
  Filled 2024-04-03: qty 2

## 2024-04-03 MED ORDER — ALBUTEROL SULFATE (2.5 MG/3ML) 0.083% IN NEBU: 2.5000 mg | INHALATION_SOLUTION | Freq: Four times a day (QID) | RESPIRATORY_TRACT | Status: DC | PRN

## 2024-04-03 MED ORDER — IOHEXOL 300 MG/ML  SOLN
100.0000 mL | Freq: Once | INTRAMUSCULAR | Status: AC | PRN
  Administered 2024-04-03: 100 mL via INTRAVENOUS

## 2024-04-03 MED ORDER — ENOXAPARIN SODIUM 60 MG/0.6ML IJ SOSY
0.5000 mg/kg | PREFILLED_SYRINGE | INTRAMUSCULAR | Status: DC
  Administered 2024-04-03 – 2024-04-04 (×2): 42.5 mg via SUBCUTANEOUS
  Filled 2024-04-03 (×2): qty 0.6

## 2024-04-03 MED ORDER — OXYCODONE HCL 5 MG PO TABS
10.0000 mg | ORAL_TABLET | Freq: Four times a day (QID) | ORAL | Status: DC | PRN
  Administered 2024-04-05: 10 mg via ORAL
  Filled 2024-04-03: qty 2

## 2024-04-03 MED ORDER — OXYCODONE HCL 5 MG PO TABS
10.0000 mg | ORAL_TABLET | Freq: Once | ORAL | Status: AC
  Administered 2024-04-03: 10 mg via ORAL
  Filled 2024-04-03: qty 2

## 2024-04-03 MED ORDER — LORATADINE 10 MG PO TABS
10.0000 mg | ORAL_TABLET | Freq: Every day | ORAL | Status: DC
  Administered 2024-04-03 – 2024-04-05 (×3): 10 mg via ORAL
  Filled 2024-04-03 (×3): qty 1

## 2024-04-03 MED ORDER — MECLIZINE HCL 25 MG PO TABS: 25.0000 mg | ORAL_TABLET | Freq: Three times a day (TID) | ORAL | 0 refills | Status: DC | PRN

## 2024-04-03 MED ORDER — HYDROCODONE-ACETAMINOPHEN 5-325 MG PO TABS: 1.0000 | ORAL_TABLET | Freq: Once | ORAL | Status: DC

## 2024-04-03 MED ORDER — ONDANSETRON HCL 4 MG/2ML IJ SOLN: 4.0000 mg | Freq: Four times a day (QID) | INTRAMUSCULAR | Status: DC | PRN

## 2024-04-03 MED ORDER — GABAPENTIN 300 MG PO CAPS
600.0000 mg | ORAL_CAPSULE | Freq: Three times a day (TID) | ORAL | Status: DC
Start: 2024-04-03 — End: 2024-04-05
  Administered 2024-04-03 – 2024-04-05 (×6): 600 mg via ORAL
  Filled 2024-04-03 (×6): qty 2

## 2024-04-03 MED ORDER — HYDRALAZINE HCL 20 MG/ML IJ SOLN
5.0000 mg | Freq: Four times a day (QID) | INTRAMUSCULAR | Status: DC | PRN
  Administered 2024-04-03: 5 mg via INTRAVENOUS
  Filled 2024-04-03: qty 1

## 2024-04-03 MED ORDER — SODIUM CHLORIDE 0.9 % IV BOLUS
1000.0000 mL | Freq: Once | INTRAVENOUS | Status: AC
  Administered 2024-04-03: 1000 mL via INTRAVENOUS

## 2024-04-03 NOTE — Progress Notes (Signed)
 PHARMACIST - PHYSICIAN COMMUNICATION  CONCERNING:  Enoxaparin  (Lovenox ) for DVT Prophylaxis    RECOMMENDATION: Patient was prescribed enoxaprin 40mg  q24 hours for VTE prophylaxis.   Filed Weights   04/03/24 0957  Weight: 86.6 kg (191 lb)    Body mass index is 33.83 kg/m.  Estimated Creatinine Clearance: 54.5 mL/min (by C-G formula based on SCr of 0.96 mg/dL).   Based on [facility] policy patient is candidate for enoxaparin  0.5mg /kg TBW SQ every 24 hours based on BMI being >30.  DESCRIPTION: Pharmacy has adjusted enoxaparin  dose per [facility] policy.  Patient is now receiving enoxaparin  42.5 mg every 24 hours    Damien Napoleon, PharmD Clinical Pharmacist  04/03/2024 3:58 PM

## 2024-04-03 NOTE — ED Notes (Signed)
 Pt had a BM unable to obtain sample due to urine contamination in sample.

## 2024-04-03 NOTE — Discharge Instructions (Addendum)
 You are seen in the ER today for evaluation of your dizziness.  I suspect you have something called vertigo.  Your CT and MRI fortunately did not show an emergency cause for this.  Your CT showed findings of possible mild colitis which may be contributing to your abdominal pain and diarrhea.  I have sent prescriptions for meclizine  for your dizziness and Zofran  for your nausea to your pharmacy.  You can use your prescribed oxycodone  to help with pain.  Follow-up your primary care doctor closely for further evaluation.  Return to the ER for new or worsening symptoms.

## 2024-04-03 NOTE — H&P (Signed)
 History and Physical    Patient: Carrie Alvarez FMW:[id number] DOB: 01/07/1951 DOA: 04/03/2024 DOS: the patient was seen and examined on 04/03/2024 PCP: Gasper Nancyann BRAVO, MD  Patient coming from: Home  Chief Complaint:  Chief Complaint  Patient presents with   Dizziness   HPI: Carrie Alvarez is a 73 y.o. female with medical history significant of colon cancer, anemia, breast cancer, hypertension, depression, lumbar radiculopathy with chronic back pain who presented to the ED today for evaluation of dizziness as well as diarrhea and lower abdominal pain.  Pt reports 2-3 symptoms over this past weekend.  She reports chills but no known fevers (did not take temp at home).  No nausea/vomiting at home, but in the ER, PO trials were attempted and pt had vomiting, as well as persistent dizziness.  Pt called her nurse triage line to report the dizziness as she was concerned her BP might be elevated.  She was advised to come to the ER for evaluation.  Pt notes history of colon cancer that was resected.  She denies other recent illnesses or symptoms including cough, congestion, shortness of breath, chest pain, dysuria or urinary frequency or other symptoms.  ED course -- afebrile, stable vitals with HR in 90's.   Labs obtained including CBC and CMP that were notable for K 3.4, bicarb 20, anion gap 17 and total bilirubin 2.0.  CBC was normal, no leukocytosis.    Imaging for dizziness evaluation including head CT and MRI brain were non-acute, stroke has been ruled out.  CT abdomen/pelvis showed mild diffuse mural thickening of the left hemicolon concerning for colitis, status post right hemicolectomy.  Pt was started on IV fluids and Zosyn  in the ED and is being admitted for further evaluation and management including continuing IV antibiotics and stool studies.     Review of Systems: As mentioned in the history of present illness. All other systems reviewed and are negative.   Past  Medical History:  Diagnosis Date   Allergy    Anemia    Arthritis    Back pain    Breast cancer (HCC) 2015   LT LUMPECTOMY 12-2014 FOLLOWING CHEMO   Carpal tunnel syndrome    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   Cataract    Chronic pain    Colon cancer (HCC) 01/2019   Colon cancer (HCC) 2020   Depression    Dyspnea    Family history of breast cancer    Family history of colon cancer    GERD (gastroesophageal reflux disease)    problem with reflux during chemo   History of methicillin resistant staphylococcus aureus (MRSA)    Hypercholesteremia    Hypertension    Lumbosacral pain    sees pain management in [place]   Obesity    Personal history of chemotherapy 2016   left breast ca   Personal history of chemotherapy 2020   colon ca   Pinched nerve    left side, lumbar area   Uterine cancer (HCC) 1994   Past Surgical History:  Procedure Laterality Date   ABDOMINAL HYSTERECTOMY  1994   UTERINE CA   AXILLARY LYMPH NODE BIOPSY Left 12/18/2014   Procedure: AXILLARY LYMPH NODE BIOPSY/;  Surgeon: Reyes LELON Cota, MD;  Location: [facility];  Service: General;  Laterality: Left;   AXILLARY LYMPH NODE DISSECTION Left 12/18/2014   Procedure: AXILLARY LYMPH NODE DISSECTION;  Surgeon: Reyes LELON Cota, MD;  Location: [facility];  Service: General;  Laterality: Left;  BREAST BIOPSY Left 05/2014   CORE BX OF LN, METASTATIC ADENOCARCINOMA   BREAST BIOPSY Left 09/10/2023   US  Core Axilla, hydro mark- path pending   BREAST LUMPECTOMY Left 12/2014   CHEMO FIRST THEN SURGERY OF LN   BREAST SURGERY     lymph node removal   CATARACT EXTRACTION W/PHACO Left 11/26/2020   Procedure: CATARACT EXTRACTION PHACO AND INTRAOCULAR LENS PLACEMENT (IOC) LEFT;  Surgeon: Jaye Fallow, MD;  Location: [facility];  Service: Ophthalmology;  Laterality: Left;  6.40 00:43.9   CATARACT EXTRACTION W/PHACO Right 12/10/2020   Procedure: CATARACT EXTRACTION PHACO AND INTRAOCULAR LENS  PLACEMENT (IOC) RIGHT;  Surgeon: Jaye Fallow, MD;  Location: [facility];  Service: Ophthalmology;  Laterality: Right;  7.59 0:47.4   CHOLECYSTECTOMY N/A 04/19/2016   Procedure: LAPAROSCOPIC CHOLECYSTECTOMY WITH INTRAOPERATIVE CHOLANGIOGRAM;  Surgeon: Dorothyann LITTIE Husk, MD;  Location: [facility];  Service: General;  Laterality: N/A;   COLON RESECTION Right 01/10/2019   Procedure: HAND ASSISTED LAPAROSCOPIC RIGHT COLON RESECTION;  Surgeon: Jordis Laneta FALCON, MD;  Location: [facility];  Service: General;  Laterality: Right;   COLONOSCOPY WITH PROPOFOL  N/A 12/30/2018   Procedure: COLONOSCOPY WITH PROPOFOL ;  Surgeon: Jinny Carmine, MD;  Location: [facility];  Service: Endoscopy;  Laterality: N/A;   COLONOSCOPY WITH PROPOFOL  N/A 03/05/2020   Procedure: COLONOSCOPY WITH PROPOFOL ;  Surgeon: Jinny Carmine, MD;  Location: [facility];  Service: Endoscopy;  Laterality: N/A;   COLONOSCOPY WITH PROPOFOL  N/A 05/12/2022   Procedure: COLONOSCOPY WITH PROPOFOL ;  Surgeon: Jinny Carmine, MD;  Location: [facility];  Service: Endoscopy;  Laterality: N/A;   medial branch block  11/06/2015   lumbar facet Dr. Dannial   POLYPECTOMY N/A 12/30/2018   Procedure: POLYPECTOMY;  Surgeon: Jinny Carmine, MD;  Location: [facility];  Service: Endoscopy;  Laterality: N/A;  Clips placed at Hepatic Flexure Polyp (2) and Transverse Colon Polyp (4) removal sites   PORTACATH PLACEMENT Right 02/02/2019   Procedure: INSERTION PORT-A-CATH;  Surgeon: Jordis Laneta FALCON, MD;  Location: [facility];  Service: General;  Laterality: Right;   RADIOFREQUENCY ABLATION     lumbar   SENTINEL NODE BIOPSY Left 12/18/2014   Procedure: SENTINEL NODE BIOPSY;  Surgeon: Reyes LELON Cota, MD;  Location: [facility];  Service: General;  Laterality: Left;   Social History:  reports that she quit smoking about 9 years ago. Her smoking use included cigarettes. She has never used smokeless tobacco. She reports that she does not drink  alcohol and does not use drugs.  No Known Allergies  Family History  Problem Relation Age of Onset   Breast cancer Sister 66   Colon cancer Sister    Colon cancer Mother        dx 16s   Hypertension Mother    Asthma Mother    Cancer Father        voice box removed   Cancer Maternal Grandmother        unsure type   Colon cancer Cousin    Cancer Cousin        unsure type    Prior to Admission medications   Medication Sig Start Date End Date Taking? Authorizing Provider  meclizine  (ANTIVERT ) 25 MG tablet Take 1 tablet (25 mg total) by mouth 3 (three) times daily as needed for dizziness. 04/03/24  Yes Ray, Nilsa, MD  ondansetron  (ZOFRAN ) 4 MG tablet Take 1 tablet (4 mg total) by mouth every 6 (six) hours as needed for up to 7 days for nausea  or vomiting. 04/03/24 04/10/24 Yes Ray, Nilsa, MD  albuterol  (VENTOLIN  HFA) 108 (90 Base) MCG/ACT inhaler INHALE 2 PUFFS BY MOUTH EVERY 6 HOURS AS NEEDED FOR WHEEZING FOR SHORTNESS OF BREATH 12/08/23   Gasper Nancyann BRAVO, MD  amLODipine  (NORVASC ) 5 MG tablet Take 1 tablet (5 mg total) by mouth daily. 12/10/23   Gasper Nancyann BRAVO, MD  atorvastatin  (LIPITOR ) 80 MG tablet Take 1 tablet (80 mg total) by mouth daily. 10/13/23   Gasper Nancyann BRAVO, MD  baclofen  (LIORESAL ) 10 MG tablet Take 1 tablet (10 mg total) by mouth 2 (two) times daily. 10/28/17   Ashok Kathrine HERO, PA-C  cetirizine  (ZYRTEC ) 10 MG tablet TAKE 1 TABLET BY MOUTH ONCE DAILY AT  6AM 12/13/23   Gasper Nancyann BRAVO, MD  cholecalciferol  (VITAMIN D ) 1000 units tablet Take 1,000 Units by mouth daily.    [provider]  fluticasone  (FLONASE ) 50 MCG/ACT nasal spray USE 2 SPRAY(S) IN EACH NOSTRIL ONCE DAILY 10/09/23   Gasper Nancyann BRAVO, MD  furosemide  (LASIX ) 20 MG tablet Take 1 tablet (20 mg total) by mouth daily as needed for fluid. 11/18/23   Gasper Nancyann BRAVO, MD  gabapentin  (NEURONTIN ) 600 MG tablet Take 1 tablet (600 mg total) by mouth 3 (three) times daily. 11/19/23   Finnegan, Timothy J, MD  NARCAN 4  MG/0.1ML LIQD nasal spray kit Place 1 spray into the nose.  06/06/19   [provider]  ondansetron  (ZOFRAN -ODT) 4 MG disintegrating tablet Take 1 tablet (4 mg total) by mouth every 8 (eight) hours as needed for nausea or vomiting. 06/29/23   Lang Dover, MD  Oxycodone  HCl 10 MG TABS LIMIT ONE HALF TO ONE TABLET BY MOUTH 3 TO 5 TIMES DAILY IF TOLERATED 02/21/19   [provider]  PREVIDENT 5000 DRY MOUTH 1.1 % GEL dental gel Place onto teeth at bedtime. 06/28/20   [provider]  promethazine -dextromethorphan (PROMETHAZINE -DM) 6.25-15 MG/5ML syrup Take 5 mLs by mouth 4 (four) times daily as needed for cough. 12/10/23   Gasper Nancyann BRAVO, MD  pyridoxine  (B-6) 100 MG tablet Take 100 mg by mouth daily.    [provider]  sertraline  (ZOLOFT ) 100 MG tablet Take 2 tablets by mouth once daily 11/16/23   Gasper Nancyann BRAVO, MD  traZODone  (DESYREL ) 50 MG tablet TAKE 1 TO 2 TABLETS BY MOUTH AT BEDTIME AS NEEDED FOR SLEEP 02/20/24   Gasper Nancyann BRAVO, MD  vitamin B-12 (CYANOCOBALAMIN ) 500 MCG tablet Take 500 mcg by mouth daily.    [provider]    Physical Exam: Vitals:   04/03/24 0957 04/03/24 1001 04/03/24 1504  BP: 119/89 119/89   Pulse: 93 98 98  Resp: 18 18   Temp: 98 F (36.7 C) 98 F (36.7 C) 98.1 F (36.7 C)  TempSrc: Oral Oral   SpO2: 100% 100%   Weight: 86.6 kg    Height: 5' 3 (1.6 m)     General exam: awake, alert, no acute distress, mildly ill appearing HEENT: atraumatic, clear conjunctiva, anicteric sclera, moist mucus membranes, hearing grossly normal  Respiratory system: CTAB, no wheezes, rales or rhonchi, normal respiratory effort. Cardiovascular system: normal S1/S2, RRR, no JVD, murmurs, rubs, gallops, no pedal edema.   Gastrointestinal system: soft, mildly tender on palpation without rebound or guarding Central nervous system: A&O x 3. no gross focal neurologic deficits, normal speech Extremities: moves all, no edema, normal  tone Skin: dry, intact, normal temperature Psychiatry: normal mood, congruent affect, judgement and insight appear normal  Data Reviewed:  As reviewed above  Assessment and Plan:   Acute colitis -- based on symptoms and CT findings of left sided colonic wall thickening Zosyn  started in the ED. --Continue IV antibiotics with IV Rocephin  & Flagyl  --IV fluids --Clear liquid diet for now, advance as tolerated --IV Zofran  PRN nausea --Pain control PRN --Monitor abdominal exam closely --Repeat CT abd/pelvis in worsening abdominal pain or exam findings  Diarrhea - suspect from colitis --Check GI panel and C diff --Enteric precautions for now --IV fluids to prevent dehydration --Avoid anti-diarrheals for now  Hypertension --continue home amlodipine   Hyperlipidemia --continue home Lipitor   Lumbar radiculopathy / chronic back pain --Continue home baclofen , gabapentin   Allergic rhinitis --Clarititin to sub for home Zyrtec  --Continue home Flonase   Depression --Continue home Zoloft     Advance Care Planning: Code status - full code  Consults: None  Family Communication: None at bedside.  Called Jasmine and updated by phone.  Severity of Illness: The appropriate patient status for this patient is OBSERVATION. Observation status is judged to be reasonable and necessary in order to provide the required intensity of service to ensure the patient's safety. The patient's presenting symptoms, physical exam findings, and initial radiographic and laboratory data in the context of their medical condition is felt to place them at decreased risk for further clinical deterioration. Furthermore, it is anticipated that the patient will be medically stable for discharge from the hospital within 2 midnights of admission.   Author: Burnard DELENA Cunning, DO 04/03/2024 3:49 PM  For on call review [email or web address].

## 2024-04-03 NOTE — Telephone Encounter (Signed)
 FYI Only or Action Required?: FYI only for provider.  Patient was last seen in primary care on 12/10/2023 by Gasper Nancyann BRAVO, MD.  Called Nurse Triage reporting Dizziness.  Symptoms began several days ago.  Interventions attempted: Nothing.  Symptoms are: gradually worsening.  Triage Disposition: Go to ED Now (Notify PCP)  Patient/caregiver understands and will follow disposition?: Yes Reason for Disposition  SEVERE dizziness (vertigo) (e.g., unable to walk without assistance)  Answer Assessment - Initial Assessment Questions Due to neurological symptom and comorbidity, pt was triaged to the ED and agreed. This RN requested she call after discharge to follow-up. Has another person to drive her.  1. DESCRIPTION: Describe your dizziness.     When standing, the room is spinning  2. VERTIGO: Do you feel like either you or the room is spinning or tilting?      Yes  3. LIGHTHEADED: Do you feel lightheaded? (e.g., somewhat faint, woozy, weak upon standing)     No, the room spins  4. SEVERITY: How bad is it?  Can you walk?     Requires that she hold onto furniture/walls  5. ONSET:  When did the dizziness begin?     04/01/24  6. AGGRAVATING FACTORS: Does anything make it worse? (e.g., standing, change in head position)     Position changes  7. CAUSE: What do you think is causing the dizziness?     Unknown  8. RECURRENT SYMPTOM: Have you had dizziness before? If Yes, ask: When was the last time? What happened that time?     Denies  9. OTHER SYMPTOMS: Do you have any other symptoms? (e.g., earache, headache, numbness, tinnitus, vomiting, weakness)     Denies  Protocols used: Dizziness - Vertigo-A-AH Copied from CRM [id number]. Topic: Clinical - Red Word Triage >> Apr 03, 2024  8:57 AM Leonette SQUIBB wrote: Red Word that prompted transfer to Nurse Triage: pt is light headed and feels like her blood pressure is up

## 2024-04-03 NOTE — ED Triage Notes (Signed)
 Pt to ED via POV from home. Pt reports sent by PCP due to possibly increased BP. Pt reports has been med compliant. Has not taken BP at home. Pt reports has been swimmy headed and dizzy.

## 2024-04-03 NOTE — ED Provider Notes (Addendum)
 [facility] Provider Note    Event Date/Time   First MD Initiated Contact with Patient 04/03/24 1052     (approximate)   History   Dizziness   HPI  Carrie Alvarez is a 73 year old female presenting to the emergency department for evaluation of dizziness. Patient reports that she woke up this morning and noticed that she felt dizzy.  Describes this as a spinning sensation, denies lightheadedness or LOC.  Unsure if she has had similar symptoms before.  Initially was concerned that her blood pressure may be high.  Called into her nurse triage line and was directed to the ER.  Patient additionally reports that she has had a couple days of diarrhea with some lower abdominal pain.  Denies dysuria, urinary frequency.     Physical Exam   Triage Vital Signs: ED Triage Vitals  Encounter Vitals Group     BP 04/03/24 0957 119/89     Girls Systolic BP Percentile --      Girls Diastolic BP Percentile --      Boys Systolic BP Percentile --      Boys Diastolic BP Percentile --      Pulse Rate 04/03/24 0957 93     Resp 04/03/24 0957 18     Temp 04/03/24 0957 98 F (36.7 C)     Temp Source 04/03/24 0957 Oral     SpO2 04/03/24 0957 100 %     Weight 04/03/24 0957 191 lb (86.6 kg)     Height 04/03/24 0957 5' 3 (1.6 m)     Head Circumference --      Peak Flow --      Pain Score 04/03/24 1001 0     Pain Loc --      Pain Education --      Exclude from Growth Chart --     Most recent vital signs: Vitals:   04/03/24 1001 04/03/24 1504  BP: 119/89   Pulse: 98 98  Resp: 18   Temp: 98 F (36.7 C) 98.1 F (36.7 C)  SpO2: 100%      General: Awake, interactive  CV:  Regular rate, good peripheral perfusion.  Resp:  Unlabored respirations.  Abd:  Nondistended, soft, mild tenderness over the lower abdomen without rebound or guarding Neuro:  Keenly aware, correctly answers month and age, able to blink eyes and squeeze hands, normal horizontal extraocular  movements, no visual field loss, normal facial symmetry, no arm or leg motor drift, no limb ataxia, normal sensation, no aphasia, no dysarthria, no inattention. NIH 0    ED Results / Procedures / Treatments   Labs (all labs ordered are listed, but only abnormal results are displayed) Labs Reviewed  COMPREHENSIVE METABOLIC PANEL WITH GFR - Abnormal; Notable for the following components:      Result Value   Potassium 3.4 (*)    CO2 20 (*)    Total Bilirubin 2.0 (*)    Anion gap 17 (*)    All other components within normal limits  CBC  TROPONIN I (HIGH SENSITIVITY)     EKG EKG independently reviewed and interpreted by myself demonstrates:  EKG demonstrates normal sinus rhythm at a rate of 94, PR 168, QRS 72, QTc 455, no acute ST changes  RADIOLOGY Imaging independently reviewed and interpreted by myself demonstrates:  CT head without acute bleed CT abdomen pelvis with mild areas possibly reflective of colitis MRI brain without acute abnormality  Formal Radiology Read:  CT ABDOMEN PELVIS  W CONTRAST Result Date: 04/03/2024 CLINICAL DATA:  Hypertension and abdominal pain EXAM: CT ABDOMEN AND PELVIS WITH CONTRAST TECHNIQUE: Multidetector CT imaging of the abdomen and pelvis was performed using the standard protocol following bolus administration of intravenous contrast. RADIATION DOSE REDUCTION: This exam was performed according to the departmental dose-optimization program which includes automated exposure control, adjustment of the mA and/or kV according to patient size and/or use of iterative reconstruction technique. CONTRAST:  OMNIPAQUE  IOHEXOL  300 MG/ML  SOLN COMPARISON:  CT abdomen and pelvis dated 06/29/2023 FINDINGS: Lower chest: No focal consolidation or pulmonary nodule in the lung bases. No pleural effusion or pneumothorax demonstrated. Partially imaged heart size is normal. Hepatobiliary: Hypoattenuation along the falciform ligament may reflect perfusional variation or  focal steatosis. No intra or extrahepatic biliary ductal dilation. Cholecystectomy. Pancreas: No focal lesions or main ductal dilation. Spleen: Normal in size without focal abnormality. Adrenals/Urinary Tract: No adrenal nodules. No suspicious renal mass, calculi or hydronephrosis. Excreted contrast material within the urinary bladder. Stomach/Bowel: Normal appearance of the stomach. Postsurgical changes of right hemicolectomy with ileocolonic anastomosis. Anastomosis is patent. Mild diffuse mural thickening of the post anastomotic colon. Vascular/Lymphatic: Aortic atherosclerosis. No enlarged abdominal or pelvic lymph nodes. Reproductive: No adnexal masses. Other: No free fluid, fluid collection, or free air. Musculoskeletal: No acute or abnormal lytic or blastic osseous lesions. Unchanged grade 1 anterolisthesis at L4-5. IMPRESSION: 1. Mild diffuse mural thickening of the left hemicolon status post right hemicolectomy, which may reflect colitis. 2.  Aortic Atherosclerosis (ICD10-I70.0). Electronically Signed   By: Limin  Xu M.D.   On: 04/03/2024 14:35   CT Head Wo Contrast Result Date: 04/03/2024 CLINICAL DATA:  Provided history: Syncope/presyncope, cerebrovascular cause suspected. Additional history provided: Dizziness. EXAM: CT HEAD WITHOUT CONTRAST TECHNIQUE: Contiguous axial images were obtained from the base of the skull through the vertex without intravenous contrast. RADIATION DOSE REDUCTION: This exam was performed according to the departmental dose-optimization program which includes automated exposure control, adjustment of the mA and/or kV according to patient size and/or use of iterative reconstruction technique. COMPARISON:  Brain MRI 04/03/2024.  Head CT 02/08/2023. FINDINGS: Brain: Mild generalized cerebral atrophy. Patchy and ill-defined hypoattenuation within the cerebral white matter, nonspecific but compatible with moderate chronic small vessel ischemic disease. There is no acute intracranial  hemorrhage. No demarcated cortical infarct. No extra-axial fluid collection. No evidence of an intracranial mass. No midline shift. Vascular: No hyperdense vessel. Atherosclerotic calcifications. Skull: No calvarial fracture or aggressive osseous lesion. Sinuses/Orbits: No mass or acute finding within the imaged orbits. No significant paranasal sinus disease at the imaged levels. IMPRESSION: 1. No evidence of an acute intracranial abnormality. 2. Moderate chronic small vessel ischemic changes within the cerebral white matter. 3. Mild generalized cerebral atrophy. Electronically Signed   By: Rockey Childs D.O.   On: 04/03/2024 14:25   MR Brain W and Wo Contrast Result Date: 04/03/2024 CLINICAL DATA:  Provided history: Neuro deficit, acute, stroke suspected. New vertigo. Evaluate CVA versus mass. History of colon and breast cancer. EXAM: MRI HEAD WITHOUT AND WITH CONTRAST TECHNIQUE: Multiplanar, multiecho pulse sequences of the brain and surrounding structures were obtained without and with intravenous contrast. CONTRAST:  8mL GADAVIST  GADOBUTROL  1 MMOL/ML IV SOLN COMPARISON:  Head CT 02/08/2023.  Brain MRI 12/31/2014. FINDINGS: Brain: Mild generalized cerebral atrophy. Multifocal T2 FLAIR hyperintense signal abnormality within the cerebral white matter, nonspecific but compatible with moderate chronic small vessel ischemic disease. There is no acute infarct. No evidence of an intracranial mass. No chronic  intracranial blood products. No extra-axial fluid collection. No midline shift. No pathologic intracranial enhancement identified. Vascular: Maintained flow voids within the proximal large arterial vessels. Right frontal lobe developmental venous anomaly (anatomic variant). Skull and upper cervical spine: No focal worrisome marrow lesion. Incompletely assessed cervical spondylosis. Sinuses/Orbits: No mass or acute finding within the imaged orbits. Prior bilateral ocular lens replacement. No significant paranasal  sinus disease. IMPRESSION: 1. No evidence of intracranial metastatic disease or an acute intracranial abnormality. 2. Moderate chronic small vessel ischemic changes within the cerebral white matter, progressed since the prior MRI of 12/31/2014. 3. Mild generalized cerebral atrophy. Electronically Signed   By: Rockey Childs D.O.   On: 04/03/2024 13:00    PROCEDURES:  Critical Care performed: No  Procedures   MEDICATIONS ORDERED IN ED: Medications  ondansetron  (ZOFRAN ) injection 4 mg (has no administration in time range)  oxyCODONE  (Oxy IR/ROXICODONE ) immediate release tablet 10 mg (has no administration in time range)  sodium chloride  0.9 % bolus 1,000 mL (1,000 mLs Intravenous New Bag/Given 04/03/24 1153)  meclizine  (ANTIVERT ) tablet 25 mg (25 mg Oral Given 04/03/24 1157)  iohexol  (OMNIPAQUE ) 300 MG/ML solution 100 mL (100 mLs Intravenous Contrast Given 04/03/24 1357)  gadobutrol  (GADAVIST ) 1 MMOL/ML injection 8 mL (8 mLs Intravenous Contrast Given 04/03/24 1227)     IMPRESSION / MDM / ASSESSMENT AND PLAN / ED COURSE  I reviewed the triage vital signs and the nursing notes.  Differential diagnosis includes, but is not limited to, peripheral vertigo, central vertigo including CVA, metastatic lesion, anemia, electrolyte abnormality, colitis, diverticulitis, other acute intra-abdominal process  Patient's presentation is most consistent with acute presentation with potential threat to life or bodily function.  73 year old female presenting to the emergency department for evaluation of dizziness.  Stable vitals on presentation.  CBC, CMP overall reassuring.  Negative troponin, reassuring EKG.  CT head and MRI brain without acute findings.  CT abdomen/pelvis demonstrates findings suggestive of colitis.  This may be contributing to patient's abdominal pain and diarrhea.  Patient was ordered for IV fluids and meclizine .  On reevaluation she does report that her dizziness has improved and but reported  persistent nausea.  Was initially interested in discharge home, but shortly after attempting to medication here had recurrent vomiting.  Suspect vomiting and diarrhea is likely related to colitis noted on CT scan.  With her persistent symptoms here as well as associated dizziness, do think she is appropriate for admission.  Will reach out to hospitalist team.     FINAL CLINICAL IMPRESSION(S) / ED DIAGNOSES   Final diagnoses:  Vertigo  Diarrhea, unspecified type     Rx / DC Orders   ED Discharge Orders          Ordered    ondansetron  (ZOFRAN ) 4 MG tablet  Every 6 hours PRN        04/03/24 1515    meclizine  (ANTIVERT ) 25 MG tablet  3 times daily PRN        04/03/24 1515             Note:  This document was prepared using Dragon voice recognition software and may include unintentional dictation errors.   Levander Slate, MD 04/03/24 1515    Levander Slate, MD 04/03/24 [phone]

## 2024-04-04 DIAGNOSIS — K529 Noninfective gastroenteritis and colitis, unspecified: Secondary | ICD-10-CM | POA: Diagnosis not present

## 2024-04-04 LAB — GASTROINTESTINAL PANEL BY PCR, STOOL (REPLACES STOOL CULTURE)

## 2024-04-04 LAB — CBC
HCT: 32.5 % — ABNORMAL LOW (ref 36.0–46.0)
Hemoglobin: 11.5 g/dL — ABNORMAL LOW (ref 12.0–15.0)
MCH: 29.1 pg (ref 26.0–34.0)
MCHC: 35.4 g/dL (ref 30.0–36.0)
MCV: 82.3 fL (ref 80.0–100.0)
Platelets: 207 K/uL (ref 150–400)
RBC: 3.95 MIL/uL (ref 3.87–5.11)
RDW: 14.5 % (ref 11.5–15.5)
WBC: 8.3 K/uL (ref 4.0–10.5)
nRBC: 0 % (ref 0.0–0.2)

## 2024-04-04 LAB — C DIFFICILE QUICK SCREEN W PCR REFLEX
C Diff antigen: NEGATIVE
C Diff interpretation: NOT DETECTED
C Diff toxin: NEGATIVE

## 2024-04-04 LAB — GLUCOSE, CAPILLARY
Glucose-Capillary: 77 mg/dL (ref 70–99)
Glucose-Capillary: 96 mg/dL (ref 70–99)
Glucose-Capillary: 99 mg/dL (ref 70–99)

## 2024-04-04 LAB — COMPREHENSIVE METABOLIC PANEL WITH GFR
ALT: 14 U/L (ref 0–44)
AST: 17 U/L (ref 15–41)
Albumin: 3.4 g/dL — ABNORMAL LOW (ref 3.5–5.0)
Alkaline Phosphatase: 53 U/L (ref 38–126)
Anion gap: 11 (ref 5–15)
BUN: 12 mg/dL (ref 8–23)
CO2: 23 mmol/L (ref 22–32)
Calcium: 8.9 mg/dL (ref 8.9–10.3)
Chloride: 107 mmol/L (ref 98–111)
Creatinine, Ser: 0.98 mg/dL (ref 0.44–1.00)
GFR, Estimated: >60 mL/min (ref >60)
Glucose, Bld: 68 mg/dL — ABNORMAL LOW (ref 70–99)
Potassium: 3 mmol/L — ABNORMAL LOW (ref 3.5–5.1)
Sodium: 141 mmol/L (ref 135–145)
Total Bilirubin: 1.2 mg/dL (ref 0.0–1.2)
Total Protein: 6.3 g/dL — ABNORMAL LOW (ref 6.5–8.1)

## 2024-04-04 MED ORDER — DEXTROSE IN LACTATED RINGERS 5 % IV SOLN: INTRAVENOUS | Status: AC

## 2024-04-04 MED ORDER — POTASSIUM CHLORIDE CRYS ER 20 MEQ PO TBCR
40.0000 meq | EXTENDED_RELEASE_TABLET | ORAL | Status: AC
  Administered 2024-04-04 (×2): 40 meq via ORAL
  Filled 2024-04-04 (×2): qty 2

## 2024-04-04 NOTE — Progress Notes (Signed)
 Progress Note   Patient: Carrie Alvarez FMW:[id number] DOB: Feb 01, 1951 DOA: 04/03/2024     0 DOS: the patient was seen and examined on 04/04/2024   Brief hospital course:  Carrie Alvarez is a 73 y.o. female with medical history significant of colon cancer, anemia, breast cancer, hypertension, depression, lumbar radiculopathy with chronic back pain who presented to the ED today for evaluation of dizziness as well as diarrhea and lower abdominal pain.  Pt reports 2-3 symptoms over this past weekend.  She reports chills but no known fevers (did not take temp at home).  No nausea/vomiting at home, but in the ER, PO trials were attempted and pt had vomiting, as well as persistent dizziness.  Pt called her nurse triage line to report the dizziness as she was concerned her BP might be elevated.  She was advised to come to the ER for evaluation.  Pt notes history of colon cancer that was resected.  She denies other recent illnesses or symptoms including cough, congestion, shortness of breath, chest pain, dysuria or urinary frequency or other symptoms.   ED course -- afebrile, stable vitals with HR in 90's.   Labs obtained including CBC and CMP that were notable for K 3.4, bicarb 20, anion gap 17 and total bilirubin 2.0.  CBC was normal, no leukocytosis.     Imaging for dizziness evaluation including head CT and MRI brain were non-acute, stroke has been ruled out.   CT abdomen/pelvis showed mild diffuse mural thickening of the left hemicolon concerning for colitis, status post right hemicolectomy.   Pt was started on IV fluids and Zosyn  in the ED and is being admitted for further evaluation and management including continuing IV antibiotics and stool studies.  Further hospital course and management as outlined below.   Assessment and Plan:  Acute colitis -- based on symptoms and CT findings of left sided colonic wall thickening Zosyn  started in the ED. --Continue IV antibiotics with IV  Rocephin  & Flagyl  --IV fluids --Clear liquid diet for now, advance as tolerated --IV Zofran  PRN nausea --Pain control PRN --Monitor abdominal exam closely --Repeat CT abd/pelvis in worsening abdominal pain or exam findings   Diarrhea - suspect from colitis --Check GI panel and C diff -- sample not sent yet. Pt still having diarrhea.  Asked RN to send sample ASAP --Enteric precautions for now --IV fluids to prevent dehydration --Avoid anti-diarrheals for now  Hypoglycemia  - fasting glucose on AM BMP was 68 --Hypoglycemia protocol --CBG's ordered for closer monitoring --Fluids: D5-LR @ 75 cc/hr   Hypertension --continue home amlodipine  --PRN IV hydralazine    Hyperlipidemia --continue home Lipitor    Lumbar radiculopathy / chronic back pain --Continue home baclofen , gabapentin    Allergic rhinitis --Clarititin to sub for home Zyrtec  --Continue home Flonase    Depression --Continue home Zoloft       Subjective: Pt seen up in recliner today. She reports ongoing lower abdominal discomfort and diarrhea.  States she has diarrhea shortly after any PO intake.  Wants to stay on clear liquids for today, not ready to try advancing.  She denies other acute complaints.   Physical Exam: Vitals:   04/03/24 2230 04/03/24 2252 04/04/24 0507 04/04/24 0759  BP: (!) 133/92 117/84 (!) 122/90 128/87  Pulse: 80 86 88 89  Resp:  18 19 16   Temp:  98.2 F (36.8 C) 98.1 F (36.7 C) 98.7 F (37.1 C)  TempSrc:  Oral Oral   SpO2: 98% 100% 100% 99%  Weight:  Height:       General exam: awake, alert, no acute distress, mildly ill appearing HEENT: moist mucus membranes, hearing grossly normal  Respiratory system: CTAB, no wheezes, rales or rhonchi, normal respiratory effort. Cardiovascular system: normal S1/S2, RRR, no pedal edema.   Gastrointestinal system: soft, mild lower abdominal tenderness no rebound or guarding, +bowel sounds. Central nervous system: A&O x3. no gross focal  neurologic deficits, normal speech Extremities: moves all , no edema, normal tone Skin: dry, intact, normal temperature, normal color, No rashes, lesions or ulcers Psychiatry: normal mood, congruent affect, judgement and insight appear normal   Data Reviewed:  Notable labs -- K 3.0, glucose 68, albumin 3.4 Hbg 11.5  Family Communication: None present. Daughter updated yesterday on admission. Will attempt to call as time allows.  Disposition: Status is: Observation  Remains admitted on IV antibiotics and IV fluids with persistent colitis symptoms.  Needs further clinical improvement.   Planned Discharge Destination: Home    Time spent: 42 minutes  Author: Burnard DELENA Cunning, DO 04/04/2024 12:42 PM  For on call review [email or web address].

## 2024-04-04 NOTE — Plan of Care (Signed)

## 2024-04-04 NOTE — Care Management Obs Status (Signed)
 MEDICARE OBSERVATION STATUS NOTIFICATION   Patient Details  Name: Carrie Alvarez MRN: [id number] Date of Birth: 12-30-1950   Medicare Observation Status Notification Given:  Yes    Jody Aguinaga W, CMA 04/04/2024, 11:16 AM

## 2024-04-04 NOTE — Plan of Care (Signed)
  Problem: Education: Goal: Knowledge of General Education information will improve Description: Including pain rating scale, medication(s)/side effects and non-pharmacologic comfort measures Outcome: Progressing   Problem: Clinical Measurements: Goal: Ability to maintain clinical measurements within normal limits will improve Outcome: Progressing   Problem: Coping: Goal: Level of anxiety will decrease Outcome: Progressing   Problem: Elimination: Goal: Will not experience complications related to bowel motility Outcome: Progressing   Problem: Pain Managment: Goal: General experience of comfort will improve and/or be controlled Outcome: Progressing

## 2024-04-05 DIAGNOSIS — M5416 Radiculopathy, lumbar region: Secondary | ICD-10-CM | POA: Diagnosis present

## 2024-04-05 DIAGNOSIS — Z853 Personal history of malignant neoplasm of breast: Secondary | ICD-10-CM | POA: Diagnosis not present

## 2024-04-05 DIAGNOSIS — Z85038 Personal history of other malignant neoplasm of large intestine: Secondary | ICD-10-CM | POA: Diagnosis not present

## 2024-04-05 DIAGNOSIS — K219 Gastro-esophageal reflux disease without esophagitis: Secondary | ICD-10-CM | POA: Diagnosis present

## 2024-04-05 DIAGNOSIS — Z825 Family history of asthma and other chronic lower respiratory diseases: Secondary | ICD-10-CM | POA: Diagnosis not present

## 2024-04-05 DIAGNOSIS — Z8249 Family history of ischemic heart disease and other diseases of the circulatory system: Secondary | ICD-10-CM | POA: Diagnosis not present

## 2024-04-05 DIAGNOSIS — K529 Noninfective gastroenteritis and colitis, unspecified: Secondary | ICD-10-CM | POA: Diagnosis not present

## 2024-04-05 DIAGNOSIS — Z803 Family history of malignant neoplasm of breast: Secondary | ICD-10-CM | POA: Diagnosis not present

## 2024-04-05 DIAGNOSIS — Z8614 Personal history of Methicillin resistant Staphylococcus aureus infection: Secondary | ICD-10-CM | POA: Diagnosis not present

## 2024-04-05 DIAGNOSIS — G8929 Other chronic pain: Secondary | ICD-10-CM | POA: Diagnosis present

## 2024-04-05 DIAGNOSIS — Z9842 Cataract extraction status, left eye: Secondary | ICD-10-CM | POA: Diagnosis not present

## 2024-04-05 DIAGNOSIS — Z9049 Acquired absence of other specified parts of digestive tract: Secondary | ICD-10-CM | POA: Diagnosis not present

## 2024-04-05 DIAGNOSIS — Z90710 Acquired absence of both cervix and uterus: Secondary | ICD-10-CM | POA: Diagnosis not present

## 2024-04-05 DIAGNOSIS — Z79899 Other long term (current) drug therapy: Secondary | ICD-10-CM | POA: Diagnosis not present

## 2024-04-05 DIAGNOSIS — E7800 Pure hypercholesterolemia, unspecified: Secondary | ICD-10-CM | POA: Diagnosis present

## 2024-04-05 DIAGNOSIS — J309 Allergic rhinitis, unspecified: Secondary | ICD-10-CM | POA: Diagnosis present

## 2024-04-05 DIAGNOSIS — Z9221 Personal history of antineoplastic chemotherapy: Secondary | ICD-10-CM | POA: Diagnosis not present

## 2024-04-05 DIAGNOSIS — Z87891 Personal history of nicotine dependence: Secondary | ICD-10-CM | POA: Diagnosis not present

## 2024-04-05 DIAGNOSIS — F32A Depression, unspecified: Secondary | ICD-10-CM | POA: Diagnosis present

## 2024-04-05 DIAGNOSIS — I10 Essential (primary) hypertension: Secondary | ICD-10-CM | POA: Diagnosis present

## 2024-04-05 DIAGNOSIS — E162 Hypoglycemia, unspecified: Secondary | ICD-10-CM | POA: Diagnosis present

## 2024-04-05 DIAGNOSIS — Z800 Family history of malignant neoplasm of digestive organs: Secondary | ICD-10-CM | POA: Diagnosis not present

## 2024-04-05 DIAGNOSIS — Z8542 Personal history of malignant neoplasm of other parts of uterus: Secondary | ICD-10-CM | POA: Diagnosis not present

## 2024-04-05 DIAGNOSIS — Z961 Presence of intraocular lens: Secondary | ICD-10-CM | POA: Diagnosis present

## 2024-04-05 LAB — GLUCOSE, CAPILLARY
Glucose-Capillary: 150 mg/dL — ABNORMAL HIGH (ref 70–99)
Glucose-Capillary: 86 mg/dL (ref 70–99)

## 2024-04-05 NOTE — Discharge Summary (Signed)
 Physician Discharge Summary   Carrie Alvarez  female DOB: Aug 22, 1950  FMW:[id number]  PCP: Gasper Nancyann BRAVO, MD  Admit date: 04/03/2024 Discharge date: 04/05/2024  Admitted From: home Disposition:  home CODE STATUS: Full code   Hospital Course:  For full details, please see H&P, progress notes, consult notes and ancillary notes.  Briefly,  Carrie Alvarez is a 73 y.o. female with medical history significant of colon cancer, breast cancer, hypertension, lumbar radiculopathy with chronic back pain who presented to the ED for evaluation of dizziness as well as diarrhea and lower abdominal pain.   Imaging for dizziness evaluation including head CT and MRI brain were non-acute, stroke has been ruled out.   CT abdomen/pelvis showed mild diffuse mural thickening of the left hemicolon concerning for colitis.  Acute colitis  -- based on symptoms and CT findings of left sided colonic wall thickening.   Zosyn  started in the ED f/b 2 days of ceftriaxone  and flagyl . --abdominal pain improved, pt was tolerating soft diet prior to discharge.  Diarrhea  - suspect from colitis --GI panel and C diff neg --received IV fluids to prevent dehydration   Hypoglycemia   --likely from poor oral intake.  Improved with D5 infusion.   Hypertension --continue home amlodipine    Hyperlipidemia --continue home Lipitor    Lumbar radiculopathy / chronic back pain --Continue home baclofen , gabapentin    Depression --Continue home Zoloft    Discharge Diagnoses:  Principal Problem:   Colitis   30 Day Unplanned Readmission Risk Score    Flowsheet Row ED to Hosp-Admission (Current) from 04/03/2024 in [facility] (1A)  30 Day Unplanned Readmission Risk Score (%) 15.44 Filed at 04/05/2024 1200    This score is the patient's risk of an unplanned readmission within 30 days of being discharged (0 -100%). The score is based on dignosis, age, lab data,  medications, orders, and past utilization.   Low:  0-14.9   Medium: 15-21.9   High: 22-29.9   Extreme: 30 and above         Discharge Instructions:  Allergies as of 04/05/2024   No Known Allergies      Medication List     STOP taking these medications    ondansetron  4 MG disintegrating tablet Commonly known as: ZOFRAN -ODT       TAKE these medications    albuterol  108 (90 Base) MCG/ACT inhaler Commonly known as: VENTOLIN  HFA INHALE 2 PUFFS BY MOUTH EVERY 6 HOURS AS NEEDED FOR WHEEZING FOR SHORTNESS OF BREATH   amLODipine  5 MG tablet Commonly known as: NORVASC  Take 1 tablet (5 mg total) by mouth daily.   atorvastatin  80 MG tablet Commonly known as: LIPITOR  Take 1 tablet (80 mg total) by mouth daily.   baclofen  10 MG tablet Commonly known as: LIORESAL  Take 1 tablet (10 mg total) by mouth 2 (two) times daily.   cetirizine  10 MG tablet Commonly known as: ZYRTEC  TAKE 1 TABLET BY MOUTH ONCE DAILY AT  6AM   cholecalciferol  1000 units tablet Commonly known as: VITAMIN D  Take 1,000 Units by mouth daily.   cyanocobalamin  500 MCG tablet Commonly known as: VITAMIN B12 Take 500 mcg by mouth daily.   fluticasone  50 MCG/ACT nasal spray Commonly known as: FLONASE  USE 2 SPRAY(S) IN EACH NOSTRIL ONCE DAILY   furosemide  20 MG tablet Commonly known as: LASIX  Take 1 tablet (20 mg total) by mouth daily as needed for fluid.   gabapentin  600 MG tablet Commonly known as: Neurontin  Take 1  tablet (600 mg total) by mouth 3 (three) times daily.   meclizine  25 MG tablet Commonly known as: ANTIVERT  Take 1 tablet (25 mg total) by mouth 3 (three) times daily as needed for dizziness.   Narcan 4 MG/0.1ML Liqd nasal spray kit Generic drug: naloxone Place 1 spray into the nose.   ondansetron  4 MG tablet Commonly known as: Zofran  Take 1 tablet (4 mg total) by mouth every 6 (six) hours as needed for up to 7 days for nausea or vomiting.   Oxycodone  HCl 10 MG Tabs LIMIT ONE HALF  TO ONE TABLET BY MOUTH 3 TO 5 TIMES DAILY IF TOLERATED   PreviDent 5000 Dry Mouth 1.1 % Gel dental gel Generic drug: sodium fluoride  Place onto teeth at bedtime.   promethazine -dextromethorphan 6.25-15 MG/5ML syrup Commonly known as: PROMETHAZINE -DM Take 5 mLs by mouth 4 (four) times daily as needed for cough.   pyridoxine  100 MG tablet Commonly known as: B-6 Take 100 mg by mouth daily.   sertraline  100 MG tablet Commonly known as: ZOLOFT  Take 2 tablets by mouth once daily   traZODone  50 MG tablet Commonly known as: DESYREL  TAKE 1 TO 2 TABLETS BY MOUTH AT BEDTIME AS NEEDED FOR SLEEP         Follow-up Information     Gasper Nancyann BRAVO, MD Follow up in 1 week(s).   Specialty: Family Medicine Contact information: [address] [place] [address] [phone]                 No Known Allergies   The results of significant diagnostics from this hospitalization (including imaging, microbiology, ancillary and laboratory) are listed below for reference.   Consultations:   Procedures/Studies: CT ABDOMEN PELVIS W CONTRAST Result Date: 04/03/2024 CLINICAL DATA:  Hypertension and abdominal pain EXAM: CT ABDOMEN AND PELVIS WITH CONTRAST TECHNIQUE: Multidetector CT imaging of the abdomen and pelvis was performed using the standard protocol following bolus administration of intravenous contrast. RADIATION DOSE REDUCTION: This exam was performed according to the departmental dose-optimization program which includes automated exposure control, adjustment of the mA and/or kV according to patient size and/or use of iterative reconstruction technique. CONTRAST:  OMNIPAQUE  IOHEXOL  300 MG/ML  SOLN COMPARISON:  CT abdomen and pelvis dated 06/29/2023 FINDINGS: Lower chest: No focal consolidation or pulmonary nodule in the lung bases. No pleural effusion or pneumothorax demonstrated. Partially imaged heart size is normal. Hepatobiliary: Hypoattenuation along the  falciform ligament may reflect perfusional variation or focal steatosis. No intra or extrahepatic biliary ductal dilation. Cholecystectomy. Pancreas: No focal lesions or main ductal dilation. Spleen: Normal in size without focal abnormality. Adrenals/Urinary Tract: No adrenal nodules. No suspicious renal mass, calculi or hydronephrosis. Excreted contrast material within the urinary bladder. Stomach/Bowel: Normal appearance of the stomach. Postsurgical changes of right hemicolectomy with ileocolonic anastomosis. Anastomosis is patent. Mild diffuse mural thickening of the post anastomotic colon. Vascular/Lymphatic: Aortic atherosclerosis. No enlarged abdominal or pelvic lymph nodes. Reproductive: No adnexal masses. Other: No free fluid, fluid collection, or free air. Musculoskeletal: No acute or abnormal lytic or blastic osseous lesions. Unchanged grade 1 anterolisthesis at L4-5. IMPRESSION: 1. Mild diffuse mural thickening of the left hemicolon status post right hemicolectomy, which may reflect colitis. 2.  Aortic Atherosclerosis (ICD10-I70.0). Electronically Signed   By: Limin  Xu M.D.   On: 04/03/2024 14:35   CT Head Wo Contrast Result Date: 04/03/2024 CLINICAL DATA:  Provided history: Syncope/presyncope, cerebrovascular cause suspected. Additional history provided: Dizziness. EXAM: CT HEAD WITHOUT CONTRAST TECHNIQUE: Contiguous  axial images were obtained from the base of the skull through the vertex without intravenous contrast. RADIATION DOSE REDUCTION: This exam was performed according to the departmental dose-optimization program which includes automated exposure control, adjustment of the mA and/or kV according to patient size and/or use of iterative reconstruction technique. COMPARISON:  Brain MRI 04/03/2024.  Head CT 02/08/2023. FINDINGS: Brain: Mild generalized cerebral atrophy. Patchy and ill-defined hypoattenuation within the cerebral white matter, nonspecific but compatible with moderate chronic small  vessel ischemic disease. There is no acute intracranial hemorrhage. No demarcated cortical infarct. No extra-axial fluid collection. No evidence of an intracranial mass. No midline shift. Vascular: No hyperdense vessel. Atherosclerotic calcifications. Skull: No calvarial fracture or aggressive osseous lesion. Sinuses/Orbits: No mass or acute finding within the imaged orbits. No significant paranasal sinus disease at the imaged levels. IMPRESSION: 1. No evidence of an acute intracranial abnormality. 2. Moderate chronic small vessel ischemic changes within the cerebral white matter. 3. Mild generalized cerebral atrophy. Electronically Signed   By: Rockey Childs D.O.   On: 04/03/2024 14:25   MR Brain W and Wo Contrast Result Date: 04/03/2024 CLINICAL DATA:  Provided history: Neuro deficit, acute, stroke suspected. New vertigo. Evaluate CVA versus mass. History of colon and breast cancer. EXAM: MRI HEAD WITHOUT AND WITH CONTRAST TECHNIQUE: Multiplanar, multiecho pulse sequences of the brain and surrounding structures were obtained without and with intravenous contrast. CONTRAST:  8mL GADAVIST  GADOBUTROL  1 MMOL/ML IV SOLN COMPARISON:  Head CT 02/08/2023.  Brain MRI 12/31/2014. FINDINGS: Brain: Mild generalized cerebral atrophy. Multifocal T2 FLAIR hyperintense signal abnormality within the cerebral white matter, nonspecific but compatible with moderate chronic small vessel ischemic disease. There is no acute infarct. No evidence of an intracranial mass. No chronic intracranial blood products. No extra-axial fluid collection. No midline shift. No pathologic intracranial enhancement identified. Vascular: Maintained flow voids within the proximal large arterial vessels. Right frontal lobe developmental venous anomaly (anatomic variant). Skull and upper cervical spine: No focal worrisome marrow lesion. Incompletely assessed cervical spondylosis. Sinuses/Orbits: No mass or acute finding within the imaged orbits. Prior  bilateral ocular lens replacement. No significant paranasal sinus disease. IMPRESSION: 1. No evidence of intracranial metastatic disease or an acute intracranial abnormality. 2. Moderate chronic small vessel ischemic changes within the cerebral white matter, progressed since the prior MRI of 12/31/2014. 3. Mild generalized cerebral atrophy. Electronically Signed   By: Rockey Childs D.O.   On: 04/03/2024 13:00      Labs: BNP (last 3 results) Recent Labs    12/10/23 1041  BNP 25.5   Basic Metabolic Panel: Recent Labs  Lab 04/03/24 1003 04/04/24 0447  NA 144 141  K 3.4* 3.0*  CL 107 107  CO2 20* 23  GLUCOSE 91 68*  BUN 13 12  CREATININE 0.96 0.98  CALCIUM  10.0 8.9   Liver Function Tests: Recent Labs  Lab 04/03/24 1003 04/04/24 0447  AST 25 17  ALT 16 14  ALKPHOS 61 53  BILITOT 2.0* 1.2  PROT 7.4 6.3*  ALBUMIN 4.1 3.4*   No results for input(s): LIPASE, AMYLASE in the last 168 hours. No results for input(s): AMMONIA in the last 168 hours. CBC: Recent Labs  Lab 04/03/24 1003 04/04/24 0447  WBC 9.2 8.3  HGB 13.5 11.5*  HCT 38.1 32.5*  MCV 80.5 82.3  PLT 241 207   Cardiac Enzymes: No results for input(s): CKTOTAL, CKMB, CKMBINDEX, TROPONINI in the last 168 hours. BNP: Invalid input(s): POCBNP CBG: Recent Labs  Lab 04/04/24 1216 04/04/24 1620  04/04/24 2201 04/05/24 0859 04/05/24 1137  GLUCAP 77 96 99 150* 86   D-Dimer No results for input(s): DDIMER in the last 72 hours. Hgb A1c No results for input(s): HGBA1C in the last 72 hours. Lipid Profile No results for input(s): CHOL, HDL, LDLCALC, TRIG, CHOLHDL, LDLDIRECT in the last 72 hours. Thyroid  function studies No results for input(s): TSH, T4TOTAL, T3FREE, THYROIDAB in the last 72 hours.  Invalid input(s): FREET3 Anemia work up No results for input(s): VITAMINB12, FOLATE, FERRITIN, TIBC, IRON, RETICCTPCT in the last 72 hours. Urinalysis     Component Value Date/Time   COLORURINE YELLOW (A) 06/29/2023 1523   APPEARANCEUR HAZY (A) 06/29/2023 1523   APPEARANCEUR Clear 08/20/2014 1250   LABSPEC 1.043 (H) 06/29/2023 1523   LABSPEC 1.016 08/20/2014 1250   PHURINE 6.0 06/29/2023 1523   GLUCOSEU NEGATIVE 06/29/2023 1523   GLUCOSEU Negative 08/20/2014 1250   HGBUR NEGATIVE 06/29/2023 1523   BILIRUBINUR NEGATIVE 06/29/2023 1523   BILIRUBINUR Negative 08/20/2014 1250   KETONESUR 5 (A) 06/29/2023 1523   PROTEINUR NEGATIVE 06/29/2023 1523   NITRITE NEGATIVE 06/29/2023 1523   LEUKOCYTESUR NEGATIVE 06/29/2023 1523   LEUKOCYTESUR Negative 08/20/2014 1250   Sepsis Labs Recent Labs  Lab 04/03/24 1003 04/04/24 0447  WBC 9.2 8.3   Microbiology Recent Results (from the past 240 hours)  C Difficile Quick Screen w PCR reflex     Status: None   Collection Time: 04/04/24  4:10 PM   Specimen: STOOL  Result Value Ref Range Status   C Diff antigen NEGATIVE NEGATIVE Final   C Diff toxin NEGATIVE NEGATIVE Final   C Diff interpretation No C. difficile detected.  Final    Comment: Performed at [facility], [address] [place], [place] [address]  Gastrointestinal Panel by PCR , Stool     Status: None   Collection Time: 04/04/24  4:10 PM   Specimen: Stool  Result Value Ref Range Status   Campylobacter species NOT DETECTED NOT DETECTED Final   Plesimonas shigelloides NOT DETECTED NOT DETECTED Final   Salmonella species NOT DETECTED NOT DETECTED Final   Yersinia enterocolitica NOT DETECTED NOT DETECTED Final   Vibrio species NOT DETECTED NOT DETECTED Final   Vibrio cholerae NOT DETECTED NOT DETECTED Final   Enteroaggregative E coli (EAEC) NOT DETECTED NOT DETECTED Final   Enteropathogenic E coli (EPEC) NOT DETECTED NOT DETECTED Final   Enterotoxigenic E coli (ETEC) NOT DETECTED NOT DETECTED Final   Shiga like toxin producing E coli (STEC) NOT DETECTED NOT DETECTED Final   Shigella/Enteroinvasive E coli (EIEC) NOT DETECTED  NOT DETECTED Final   Cryptosporidium NOT DETECTED NOT DETECTED Final   Cyclospora cayetanensis NOT DETECTED NOT DETECTED Final   Entamoeba histolytica NOT DETECTED NOT DETECTED Final   Giardia lamblia NOT DETECTED NOT DETECTED Final   Adenovirus F40/41 NOT DETECTED NOT DETECTED Final   Astrovirus NOT DETECTED NOT DETECTED Final   Norovirus GI/GII NOT DETECTED NOT DETECTED Final   Rotavirus A NOT DETECTED NOT DETECTED Final   Sapovirus (I, II, IV, and V) NOT DETECTED NOT DETECTED Final    Comment: Performed at [facility], [address] [place], [place] [address]     Total time spend on discharging this patient, including the last patient exam, discussing the hospital stay, instructions for ongoing care as it relates to all pertinent caregivers, as well as preparing the medical discharge records, prescriptions, and/or referrals as applicable, is 35 minutes.    Ellouise Haber, MD  [facility] 04/05/2024, 2:54 PM

## 2024-04-05 NOTE — Plan of Care (Signed)
   Problem: Clinical Measurements: Goal: Ability to maintain clinical measurements within normal limits will improve Outcome: Progressing Goal: Respiratory complications will improve Outcome: Progressing Goal: Cardiovascular complication will be avoided Outcome: Progressing

## 2024-04-05 NOTE — Plan of Care (Signed)

## 2024-04-06 ENCOUNTER — Telehealth: Payer: Self-pay

## 2024-04-06 NOTE — Transitions of Care (Post Inpatient/ED Visit) (Signed)
 04/06/2024  Name: Carrie Alvarez MRN: [id number] DOB: February 02, 1951  Today's TOC FU Call Status: Today's TOC FU Call Status:: Successful TOC FU Call Completed TOC FU Call Complete Date: 04/06/24 Patient's Name and Date of Birth confirmed.  Transition Care Management Follow-up Telephone Call Date of Discharge: 04/05/24 Discharge Facility: [facility]) Type of Discharge: Inpatient Admission Primary Inpatient Discharge Diagnosis:: colitis How have you been since you were released from the hospital?: Better Any questions or concerns?: No  Items Reviewed: Did you receive and understand the discharge instructions provided?: Yes Medications obtained,verified, and reconciled?: Yes (Medications Reviewed) Any new allergies since your discharge?: No Dietary orders reviewed?: Yes Do you have support at home?: Yes People in Home [RPT]: child(ren), adult  Medications Reviewed Today: Medications Reviewed Today     Reviewed by Emmitt Pan, LPN (Licensed Practical Nurse) on 04/06/24 at [phone]  Med List Status: <None>   Medication Order Taking? Sig Documenting Provider Last Dose Status Informant  albuterol  (VENTOLIN  HFA) 108 (90 Base) MCG/ACT inhaler [id number] Yes INHALE 2 PUFFS BY MOUTH EVERY 6 HOURS AS NEEDED FOR WHEEZING FOR SHORTNESS OF SHERIDA Gasper Nancyann FORBES, MD  Active Self, Pharmacy Records  amLODipine  (NORVASC ) 5 MG tablet [id number] Yes Take 1 tablet (5 mg total) by mouth daily. Gasper Nancyann FORBES, MD  Active Self, Pharmacy Records  atorvastatin  (LIPITOR ) 80 MG tablet [id number] Yes Take 1 tablet (80 mg total) by mouth daily. Gasper Nancyann FORBES, MD  Active Self, Pharmacy Records  baclofen  (LIORESAL ) 10 MG tablet [id number] Yes Take 1 tablet (10 mg total) by mouth 2 (two) times daily. Ashok Kathrine HERO, PA-C  Active Self, Pharmacy Records  cetirizine  (ZYRTEC ) 10 MG tablet [id number] Yes TAKE 1 TABLET BY MOUTH ONCE DAILY AT  6AM Gasper Nancyann FORBES, MD  Active Self,  Pharmacy Records  cholecalciferol  (VITAMIN D ) 1000 units tablet [id number] Yes Take 1,000 Units by mouth daily. [provider]  Active Self, Pharmacy Records  fluticasone  (FLONASE ) 50 MCG/ACT nasal spray [id number] Yes USE 2 SPRAY(S) IN EACH NOSTRIL ONCE DAILY Gasper Nancyann FORBES, MD  Active Self, Pharmacy Records  furosemide  (LASIX ) 20 MG tablet [id number] Yes Take 1 tablet (20 mg total) by mouth daily as needed for fluid. Gasper Nancyann FORBES, MD  Active Self, Pharmacy Records  gabapentin  (NEURONTIN ) 600 MG tablet [id number] Yes Take 1 tablet (600 mg total) by mouth 3 (three) times daily. Jacobo Evalene PARAS, MD  Active Self, Pharmacy Records  heparin  lock flush 100 unit/mL [id number]   Jacobo Evalene PARAS, MD  Active   heparin  lock flush 100 unit/mL [id number]   Finnegan, Timothy J, MD  Active   meclizine  (ANTIVERT ) 25 MG tablet [id number] Yes Take 1 tablet (25 mg total) by mouth 3 (three) times daily as needed for dizziness. Levander Slate, MD  Active   NARCAN 4 MG/0.1ML LIQD nasal spray kit [id number] Yes Place 1 spray into the nose.  [provider]  Active Self, Pharmacy Records  ondansetron  (ZOFRAN ) 4 MG tablet [id number] Yes Take 1 tablet (4 mg total) by mouth every 6 (six) hours as needed for up to 7 days for nausea or vomiting. Levander Slate, MD  Active   Oxycodone  HCl 10 MG TABS [id number] Yes LIMIT ONE HALF TO ONE TABLET BY MOUTH 3 TO 5 TIMES DAILY IF TOLERATED [provider]  Active Self, Pharmacy Records  PREVIDENT 5000 DRY MOUTH 1.1 % GEL dental gel [id number] Yes Place onto teeth at bedtime. [provider]  Active Self,  Pharmacy Records  promethazine -dextromethorphan (PROMETHAZINE -DM) 6.25-15 MG/5ML syrup [id number] Yes Take 5 mLs by mouth 4 (four) times daily as needed for cough. Gasper Nancyann BRAVO, MD  Active Self, Pharmacy Records  pyridoxine  (B-6) 100 MG tablet [id number] Yes Take 100 mg by mouth daily. [provider]  Active Self, Pharmacy Records  sertraline   (ZOLOFT ) 100 MG tablet [id number] Yes Take 2 tablets by mouth once daily Gasper Nancyann BRAVO, MD  Active Self, Pharmacy Records  sodium chloride  flush (NS) 0.9 % injection 10 mL [id number]   Jacobo Evalene PARAS, MD  Active   traZODone  (DESYREL ) 50 MG tablet [id number] Yes TAKE 1 TO 2 TABLETS BY MOUTH AT BEDTIME AS NEEDED FOR SLEEP Fisher, Nancyann BRAVO, MD  Active Self, Pharmacy Records  vitamin B-12 (CYANOCOBALAMIN ) 500 MCG tablet [id number] Yes Take 500 mcg by mouth daily. [provider]  Active Self, Pharmacy Records            Home Care and Equipment/Supplies: Were Home Health Services Ordered?: NA Any new equipment or medical supplies ordered?: NA  Functional Questionnaire: Do you need assistance with bathing/showering or dressing?: No Do you need assistance with meal preparation?: No Do you need assistance with eating?: No Do you have difficulty maintaining continence: No Do you need assistance with getting out of bed/getting out of a chair/moving?: No Do you have difficulty managing or taking your medications?: No  Follow up appointments reviewed: PCP Follow-up appointment confirmed?: Yes Date of PCP follow-up appointment?: 04/10/24 Follow-up Provider: [facility] Follow-up appointment confirmed?: NA Do you need transportation to your follow-up appointment?: No Do you understand care options if your condition(s) worsen?: Yes-patient verbalized understanding    SIGNATURE Julian Lemmings, LPN [facility] Nurse Health Advisor Direct Dial [phone]

## 2024-04-10 ENCOUNTER — Ambulatory Visit (INDEPENDENT_AMBULATORY_CARE_PROVIDER_SITE_OTHER): Admitting: Family Medicine

## 2024-04-10 VITALS — BP 110/79 | HR 82 | Resp 20 | Ht 63.0 in | Wt 195.2 lb

## 2024-04-10 DIAGNOSIS — R42 Dizziness and giddiness: Secondary | ICD-10-CM

## 2024-04-10 DIAGNOSIS — H53149 Visual discomfort, unspecified: Secondary | ICD-10-CM

## 2024-04-10 DIAGNOSIS — G43809 Other migraine, not intractable, without status migrainosus: Secondary | ICD-10-CM | POA: Diagnosis not present

## 2024-04-10 MED ORDER — MECLIZINE HCL 25 MG PO TABS: 25.0000 mg | ORAL_TABLET | Freq: Three times a day (TID) | ORAL | 0 refills | Status: AC | PRN

## 2024-04-10 NOTE — Progress Notes (Signed)
 Established patient visit   Patient: Carrie Alvarez   DOB: 01/19/1951   73 y.o. Female  MRN: [id number] Visit Date: 04/10/2024  Today's healthcare provider: Nancyann Perry, MD   Chief Complaint  Patient presents with   Hospitalization Follow-up    New onset per pt of feeling fatigue, no appetite (daughter states no appetite) 1 week prior to going to hospital. Still ongoing dizziness   Subjective    Discussed the use of AI scribe software for clinical note transcription with the patient, who gave verbal consent to proceed.  History of Present Illness   Carrie Alvarez is a 73 year old female who presents for follow-up after hospitalization for dizziness.  She was admitted to [facility] on April 03, 2024, and discharged on April 06, 2024, due to dizziness. During her hospitalization, she had stable vitals, a reassuring CBC and CMP, negative troponins, a reassuring EKG, and CT and MRI of the brain without acute findings. She has a history of left hemicolectomy.  She experiences ongoing dizziness, particularly when lying down, describing it as a sensation of spinning that lasts about five minutes. Dizziness also occurs when sitting up, requiring time to stabilize. The dizziness began approximately two weeks ago and worsened, leading to her hospitalization a week ago. She has not been taking any medications like dramamine or meclizine  for the dizziness.  She experienced nausea last night and reports feeling weak with no energy. She has been selective with food intake and is trying to stay hydrated.  She reports eye discomfort today, with both eyes being sensitive to light and seeing a film over them. She had a headache earlier today but has not had migraines recently, although she has experienced them in the past with light sensitivity. No trouble with hearing.       Medications: Outpatient Medications Prior to Visit  Medication Sig   albuterol  (VENTOLIN   HFA) 108 (90 Base) MCG/ACT inhaler INHALE 2 PUFFS BY MOUTH EVERY 6 HOURS AS NEEDED FOR WHEEZING FOR SHORTNESS OF BREATH   amLODipine  (NORVASC ) 5 MG tablet Take 1 tablet (5 mg total) by mouth daily.   atorvastatin  (LIPITOR ) 80 MG tablet Take 1 tablet (80 mg total) by mouth daily.   baclofen  (LIORESAL ) 10 MG tablet Take 1 tablet (10 mg total) by mouth 2 (two) times daily.   cetirizine  (ZYRTEC ) 10 MG tablet TAKE 1 TABLET BY MOUTH ONCE DAILY AT  6AM   cholecalciferol  (VITAMIN D ) 1000 units tablet Take 1,000 Units by mouth daily.   fluticasone  (FLONASE ) 50 MCG/ACT nasal spray USE 2 SPRAY(S) IN EACH NOSTRIL ONCE DAILY   furosemide  (LASIX ) 20 MG tablet Take 1 tablet (20 mg total) by mouth daily as needed for fluid.   gabapentin  (NEURONTIN ) 600 MG tablet Take 1 tablet (600 mg total) by mouth 3 (three) times daily.   NARCAN 4 MG/0.1ML LIQD nasal spray kit Place 1 spray into the nose.    Oxycodone  HCl 10 MG TABS LIMIT ONE HALF TO ONE TABLET BY MOUTH 3 TO 5 TIMES DAILY IF TOLERATED   PREVIDENT 5000 DRY MOUTH 1.1 % GEL dental gel Place onto teeth at bedtime.   promethazine -dextromethorphan (PROMETHAZINE -DM) 6.25-15 MG/5ML syrup Take 5 mLs by mouth 4 (four) times daily as needed for cough.   pyridoxine  (B-6) 100 MG tablet Take 100 mg by mouth daily.   sertraline  (ZOLOFT ) 100 MG tablet Take 2 tablets by mouth once daily   traZODone  (DESYREL ) 50 MG tablet TAKE 1 TO  2 TABLETS BY MOUTH AT BEDTIME AS NEEDED FOR SLEEP   vitamin B-12 (CYANOCOBALAMIN ) 500 MCG tablet Take 500 mcg by mouth daily.   [DISCONTINUED] ondansetron  (ZOFRAN ) 4 MG tablet Take 1 tablet (4 mg total) by mouth every 6 (six) hours as needed for up to 7 days for nausea or vomiting.   Facility-Administered Medications Prior to Visit  Medication Dose Route Frequency Provider   heparin  lock flush 100 unit/mL  500 Units Intravenous Once Finnegan, Timothy J, MD   heparin  lock flush 100 unit/mL  500 Units Intravenous Once Finnegan, Timothy J, MD    sodium chloride  flush (NS) 0.9 % injection 10 mL  10 mL Intravenous PRN Finnegan, Timothy J, MD   Review of Systems  Constitutional:  Negative for appetite change, chills, fatigue and fever.  Respiratory:  Negative for chest tightness and shortness of breath.   Cardiovascular:  Negative for chest pain and palpitations.  Gastrointestinal:  Negative for abdominal pain, nausea and vomiting.  Neurological:  Negative for dizziness and weakness.       Objective    BP 110/79   Pulse 82   Resp 20   Ht 5' 3 (1.6 m)   Wt 195 lb 3.2 oz (88.5 kg)   SpO2 100%   BMI 34.58 kg/m   Physical Exam  General Appearance:    Mildly obese female, alert, cooperative, in no acute distress  HENT:   ENT exam normal, no neck nodes or sinus tenderness  Eyes:    PERRL, conjunctiva/corneas clear, EOM's intact   Sensitive to light. No inflammation or drainage.   Lungs:     Clear to auscultation bilaterally, respirations unlabored  Heart:    Normal heart rate. Normal rhythm. No murmurs, rubs, or gallops.    Neurologic:   Awake, alert, oriented x 3. No apparent focal neurological           defect.        Assessment & Plan       Benign paroxysmal vertigo Dizziness for two weeks, worsened by lying down, episodes last five minutes. Likely BPPV due to otoliths. No hearing issues. Negative brain imaging and cardiac workup. - Prescribed meclizine  for symptom relief. - Referred to ENT if symptoms persist. - Advised to cancel ENT appointment if symptoms resolve.  Photophobia and visual disturbance Acute photophobia and visual disturbance with film over eyes. No infection.  - Advised resting in a dark room. No sign of conjunctivas or iritis. Likely migraine variant.   Fatigue Reports weakness and lack of energy. - Encouraged increased fluid intake.          Nancyann Perry, MD  [facility] [phone] (phone) [phone] (fax)  [facility]

## 2024-04-10 NOTE — Patient Instructions (Signed)
 SABRA  Please review the attached list of medications and notify my office if there are any errors.   . Please bring all of your medications to every appointment so we can make sure that our medication list is the same as yours.

## 2024-04-14 ENCOUNTER — Encounter: Payer: Self-pay | Admitting: Oncology

## 2024-04-20 ENCOUNTER — Other Ambulatory Visit: Payer: Self-pay | Admitting: Family Medicine

## 2024-04-20 DIAGNOSIS — I10 Essential (primary) hypertension: Secondary | ICD-10-CM

## 2024-04-27 DIAGNOSIS — M25562 Pain in left knee: Secondary | ICD-10-CM | POA: Diagnosis not present

## 2024-04-27 DIAGNOSIS — G894 Chronic pain syndrome: Secondary | ICD-10-CM | POA: Diagnosis not present

## 2024-04-27 DIAGNOSIS — M51362 Other intervertebral disc degeneration, lumbar region with discogenic back pain and lower extremity pain: Secondary | ICD-10-CM | POA: Diagnosis not present

## 2024-04-27 DIAGNOSIS — M62830 Muscle spasm of back: Secondary | ICD-10-CM | POA: Diagnosis not present

## 2024-04-27 DIAGNOSIS — M25561 Pain in right knee: Secondary | ICD-10-CM | POA: Diagnosis not present

## 2024-04-27 DIAGNOSIS — Z5181 Encounter for therapeutic drug level monitoring: Secondary | ICD-10-CM | POA: Diagnosis not present

## 2024-04-27 DIAGNOSIS — M5459 Other low back pain: Secondary | ICD-10-CM | POA: Diagnosis not present

## 2024-04-27 DIAGNOSIS — Z79891 Long term (current) use of opiate analgesic: Secondary | ICD-10-CM | POA: Diagnosis not present

## 2024-05-11 ENCOUNTER — Encounter
Admission: RE | Disposition: A | Discharge status: Home / Self Care | Payer: Self-pay | Source: Home / Self Care | Attending: Gastroenterology

## 2024-05-11 ENCOUNTER — Ambulatory Visit
Admission: RE | Admit: 2024-05-11 | Discharge: 2024-05-11 | Disposition: A | Discharge status: Home / Self Care | Attending: Gastroenterology | Admitting: Gastroenterology

## 2024-05-11 ENCOUNTER — Ambulatory Visit: Admitting: Anesthesiology

## 2024-05-11 ENCOUNTER — Encounter: Payer: Self-pay | Admitting: Gastroenterology

## 2024-05-11 DIAGNOSIS — Z1211 Encounter for screening for malignant neoplasm of colon: Secondary | ICD-10-CM | POA: Insufficient documentation

## 2024-05-11 DIAGNOSIS — E66813 Obesity, class 3: Secondary | ICD-10-CM | POA: Insufficient documentation

## 2024-05-11 DIAGNOSIS — Z87891 Personal history of nicotine dependence: Secondary | ICD-10-CM | POA: Insufficient documentation

## 2024-05-11 DIAGNOSIS — M1990 Unspecified osteoarthritis, unspecified site: Secondary | ICD-10-CM | POA: Diagnosis not present

## 2024-05-11 DIAGNOSIS — I10 Essential (primary) hypertension: Secondary | ICD-10-CM | POA: Insufficient documentation

## 2024-05-11 DIAGNOSIS — C184 Malignant neoplasm of transverse colon: Secondary | ICD-10-CM

## 2024-05-11 DIAGNOSIS — K640 First degree hemorrhoids: Secondary | ICD-10-CM | POA: Insufficient documentation

## 2024-05-11 DIAGNOSIS — Z980 Intestinal bypass and anastomosis status: Secondary | ICD-10-CM

## 2024-05-11 DIAGNOSIS — Z85038 Personal history of other malignant neoplasm of large intestine: Secondary | ICD-10-CM | POA: Diagnosis not present

## 2024-05-11 SURGERY — COLONOSCOPY
Anesthesia: General

## 2024-05-11 MED ORDER — PROPOFOL 10 MG/ML IV BOLUS
INTRAVENOUS | Status: DC | PRN
  Administered 2024-05-11: 20 mg via INTRAVENOUS
  Administered 2024-05-11: 30 mg via INTRAVENOUS

## 2024-05-11 MED ORDER — PROPOFOL 500 MG/50ML IV EMUL
INTRAVENOUS | Status: DC | PRN
  Administered 2024-05-11: 50 ug/kg/min via INTRAVENOUS

## 2024-05-11 MED ORDER — SODIUM CHLORIDE 0.9 % IV SOLN: INTRAVENOUS | Status: DC

## 2024-05-11 MED ORDER — PROPOFOL 1000 MG/100ML IV EMUL
INTRAVENOUS | Status: AC
  Filled 2024-05-11: qty 100

## 2024-05-11 MED ORDER — LIDOCAINE HCL (CARDIAC) PF 100 MG/5ML IV SOSY
PREFILLED_SYRINGE | INTRAVENOUS | Status: DC | PRN
  Administered 2024-05-11: 60 mg via INTRAVENOUS

## 2024-05-11 MED ORDER — LIDOCAINE HCL (PF) 2 % IJ SOLN
INTRAMUSCULAR | Status: AC
  Filled 2024-05-11: qty 5

## 2024-05-11 NOTE — Anesthesia Postprocedure Evaluation (Signed)
 Anesthesia Post Note  Patient: Carrie Alvarez No  Procedure(s) Performed: COLONOSCOPY  Anesthesia Type: General Level of consciousness: awake Pain management: pain level controlled Respiratory status: nonlabored ventilation Cardiovascular status: stable Anesthetic complications: no   No notable events documented.   Last Vitals:  Vitals:   05/11/24 1150 05/11/24 1159  BP: (!) 119/96 (!) 140/96  Pulse: 80 77  Resp: 13 14  Temp:    SpO2: 100% 91%    Last Pain:  Vitals:   05/11/24 1159  TempSrc:   PainSc: 0-No pain                 VAN STAVEREN,Carrie Alvarez

## 2024-05-11 NOTE — H&P (Signed)
 Rogelia Copping, MD [facility] [address], [address] [place], [place] [address] [phone] Fax : [phone]  Primary Care Physician:  Gasper Nancyann BRAVO, MD Primary Gastroenterologist:  Dr. Copping  Pre-Procedure History & Physical: HPI:  Carrie Alvarez is a 73 y.o. female is here for an colonoscopy.   Past Medical History:  Diagnosis Date   Allergy    Anemia    Arthritis    Back pain    Breast cancer (HCC) 2015   LT LUMPECTOMY 12-2014 FOLLOWING CHEMO   Carpal tunnel syndrome    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   Cataract    Chronic pain    Colon cancer (HCC) 01/2019   Colon cancer (HCC) 2020   Depression    Dyspnea    Family history of breast cancer    Family history of colon cancer    GERD (gastroesophageal reflux disease)    problem with reflux during chemo   History of methicillin resistant staphylococcus aureus (MRSA)    Hypercholesteremia    Hypertension    Lumbosacral pain    sees pain management in [place]   Obesity    Personal history of chemotherapy 2016   left breast ca   Personal history of chemotherapy 2020   colon ca   Pinched nerve    left side, lumbar area   Uterine cancer (HCC) 1994    Past Surgical History:  Procedure Laterality Date   ABDOMINAL HYSTERECTOMY  1994   UTERINE CA   AXILLARY LYMPH NODE BIOPSY Left 12/18/2014   Procedure: AXILLARY LYMPH NODE BIOPSY/;  Surgeon: Reyes LELON Cota, MD;  Location: [facility];  Service: General;  Laterality: Left;   AXILLARY LYMPH NODE DISSECTION Left 12/18/2014   Procedure: AXILLARY LYMPH NODE DISSECTION;  Surgeon: Reyes LELON Cota, MD;  Location: [facility];  Service: General;  Laterality: Left;   BREAST BIOPSY Left 05/2014   CORE BX OF LN, METASTATIC ADENOCARCINOMA   BREAST BIOPSY Left 09/10/2023   US  Core Axilla, hydro mark- path pending   BREAST LUMPECTOMY Left 12/2014   CHEMO FIRST THEN SURGERY OF LN   BREAST SURGERY     lymph node removal   CATARACT EXTRACTION W/PHACO Left  11/26/2020   Procedure: CATARACT EXTRACTION PHACO AND INTRAOCULAR LENS PLACEMENT (IOC) LEFT;  Surgeon: Jaye Fallow, MD;  Location: [facility];  Service: Ophthalmology;  Laterality: Left;  6.40 00:43.9   CATARACT EXTRACTION W/PHACO Right 12/10/2020   Procedure: CATARACT EXTRACTION PHACO AND INTRAOCULAR LENS PLACEMENT (IOC) RIGHT;  Surgeon: Jaye Fallow, MD;  Location: [facility];  Service: Ophthalmology;  Laterality: Right;  7.59 0:47.4   CHOLECYSTECTOMY N/A 04/19/2016   Procedure: LAPAROSCOPIC CHOLECYSTECTOMY WITH INTRAOPERATIVE CHOLANGIOGRAM;  Surgeon: Dorothyann LITTIE Husk, MD;  Location: [facility];  Service: General;  Laterality: N/A;   COLON RESECTION Right 01/10/2019   Procedure: HAND ASSISTED LAPAROSCOPIC RIGHT COLON RESECTION;  Surgeon: Jordis Laneta FALCON, MD;  Location: [facility];  Service: General;  Laterality: Right;   COLONOSCOPY WITH PROPOFOL  N/A 12/30/2018   Procedure: COLONOSCOPY WITH PROPOFOL ;  Surgeon: Copping Rogelia, MD;  Location: [facility];  Service: Endoscopy;  Laterality: N/A;   COLONOSCOPY WITH PROPOFOL  N/A 03/05/2020   Procedure: COLONOSCOPY WITH PROPOFOL ;  Surgeon: Copping Rogelia, MD;  Location: [facility];  Service: Endoscopy;  Laterality: N/A;   COLONOSCOPY WITH PROPOFOL  N/A 05/12/2022   Procedure: COLONOSCOPY WITH PROPOFOL ;  Surgeon: Copping Rogelia, MD;  Location: [facility];  Service: Endoscopy;  Laterality: N/A;   EYE SURGERY  medial branch block  11/06/2015   lumbar facet Dr. Dannial   POLYPECTOMY N/A 12/30/2018   Procedure: POLYPECTOMY;  Surgeon: Jinny Carmine, MD;  Location: [facility];  Service: Endoscopy;  Laterality: N/A;  Clips placed at Hepatic Flexure Polyp (2) and Transverse Colon Polyp (4) removal sites   PORTACATH PLACEMENT Right 02/02/2019   Procedure: INSERTION PORT-A-CATH;  Surgeon: Jordis Laneta FALCON, MD;  Location: [facility];  Service: General;  Laterality: Right;   RADIOFREQUENCY ABLATION     lumbar   SENTINEL  NODE BIOPSY Left 12/18/2014   Procedure: SENTINEL NODE BIOPSY;  Surgeon: Reyes LELON Cota, MD;  Location: [facility];  Service: General;  Laterality: Left;    Prior to Admission medications   Medication Sig Start Date End Date Taking? Authorizing Provider  amLODipine  (NORVASC ) 5 MG tablet Take 1 tablet (5 mg total) by mouth daily. 04/20/24  Yes Gasper Nancyann BRAVO, MD  atorvastatin  (LIPITOR ) 80 MG tablet Take 1 tablet (80 mg total) by mouth daily. 10/13/23  Yes Gasper Nancyann BRAVO, MD  baclofen  (LIORESAL ) 10 MG tablet Take 1 tablet (10 mg total) by mouth 2 (two) times daily. 10/28/17  Yes Ashok Kathrine HERO, PA-C  cetirizine  (ZYRTEC ) 10 MG tablet TAKE 1 TABLET BY MOUTH ONCE DAILY AT  6AM 12/13/23  Yes Gasper Nancyann BRAVO, MD  cholecalciferol  (VITAMIN D ) 1000 units tablet Take 1,000 Units by mouth daily.   Yes [provider]  fluticasone  (FLONASE ) 50 MCG/ACT nasal spray USE 2 SPRAY(S) IN EACH NOSTRIL ONCE DAILY 10/09/23  Yes Gasper Nancyann BRAVO, MD  Oxycodone  HCl 10 MG TABS LIMIT ONE HALF TO ONE TABLET BY MOUTH 3 TO 5 TIMES DAILY IF TOLERATED 02/21/19  Yes [provider]  PREVIDENT 5000 DRY MOUTH 1.1 % GEL dental gel Place onto teeth at bedtime. 06/28/20  Yes [provider]  pyridoxine  (B-6) 100 MG tablet Take 100 mg by mouth daily.   Yes [provider]  sertraline  (ZOLOFT ) 100 MG tablet Take 2 tablets by mouth once daily 11/16/23  Yes Gasper Nancyann BRAVO, MD  traZODone  (DESYREL ) 50 MG tablet TAKE 1 TO 2 TABLETS BY MOUTH AT BEDTIME AS NEEDED FOR SLEEP 02/20/24  Yes Gasper Nancyann BRAVO, MD  vitamin B-12 (CYANOCOBALAMIN ) 500 MCG tablet Take 500 mcg by mouth daily.   Yes [provider]  albuterol  (VENTOLIN  HFA) 108 (90 Base) MCG/ACT inhaler INHALE 2 PUFFS BY MOUTH EVERY 6 HOURS AS NEEDED FOR WHEEZING FOR SHORTNESS OF BREATH 12/08/23   Gasper Nancyann BRAVO, MD  furosemide  (LASIX ) 20 MG tablet Take 1 tablet (20 mg total) by mouth daily as needed for fluid. Patient not taking: Reported on  05/11/2024 11/18/23   Gasper Nancyann BRAVO, MD  gabapentin  (NEURONTIN ) 600 MG tablet Take 1 tablet (600 mg total) by mouth 3 (three) times daily. Patient not taking: Reported on 05/11/2024 11/19/23   Jacobo Evalene PARAS, MD  meclizine  (ANTIVERT ) 25 MG tablet Take 1 tablet (25 mg total) by mouth 3 (three) times daily as needed for dizziness. 04/10/24   Gasper Nancyann BRAVO, MD  NARCAN 4 MG/0.1ML LIQD nasal spray kit Place 1 spray into the nose.  06/06/19   [provider]  promethazine -dextromethorphan (PROMETHAZINE -DM) 6.25-15 MG/5ML syrup Take 5 mLs by mouth 4 (four) times daily as needed for cough. 12/10/23   Gasper Nancyann BRAVO, MD    Allergies as of 11/24/2023   (No Known Allergies)    Family History  Problem Relation Age of Onset   Breast cancer Sister 25  Colon cancer Sister    Colon cancer Mother        dx 66s   Hypertension Mother    Asthma Mother    Cancer Father        voice box removed   Cancer Maternal Grandmother        unsure type   Colon cancer Cousin    Cancer Cousin        unsure type    Social History   Socioeconomic History   Marital status: Married    Spouse name: Tod   Number of children: 3   Years of education: Not on file   Highest education level: Some college, no degree  Occupational History   Occupation: retired    Comment: was on disability  Tobacco Use   Smoking status: Former    Current packs/day: 0.00    Types: Cigarettes    Quit date: 07/18/2014    Years since quitting: 9.8   Smokeless tobacco: Never  Vaping Use   Vaping status: Never Used  Substance and Sexual Activity   Alcohol use: No    Alcohol/week: 0.0 standard drinks of alcohol   Drug use: No   Sexual activity: Not Currently  Other Topics Concern   Not on file  Social History Narrative   Husband and patient have not lived together for 10 years.  He is still helpful with patient.   Social Drivers of Corporate Investment Banker Strain: Low Risk  (01/13/2023)   Overall Financial  Resource Strain (CARDIA)    Difficulty of Paying Living Expenses: Not hard at all  Food Insecurity: No Food Insecurity (04/03/2024)   Hunger Vital Sign    Worried About Running Out of Food in the Last Year: Never true    Ran Out of Food in the Last Year: Never true  Transportation Needs: No Transportation Needs (04/03/2024)   PRAPARE - Administrator, Civil Service (Medical): No    Lack of Transportation (Non-Medical): No  Physical Activity: Inactive (01/13/2023)   Exercise Vital Sign    Days of Exercise per Week: 0 days    Minutes of Exercise per Session: 0 min  Stress: No Stress Concern Present (01/13/2023)   [employer] of Occupational Health - Occupational Stress Questionnaire    Feeling of Stress : Not at all  Social Connections: Moderately Integrated (04/03/2024)   Social Connection and Isolation Panel    Frequency of Communication with Friends and Family: More than three times a week    Frequency of Social Gatherings with Friends and Family: More than three times a week    Attends Religious Services: More than 4 times per year    Active Member of [employer] or Organizations: No    Attends Banker Meetings: Never    Marital Status: Married  Catering Manager Violence: Not At Risk (04/03/2024)   Humiliation, Afraid, Rape, and Kick questionnaire    Fear of Current or Ex-Partner: No    Emotionally Abused: No    Physically Abused: No    Sexually Abused: No    Review of Systems: See HPI, otherwise negative ROS  Physical Exam: Temp (!) 96 F (35.6 C) (Temporal)  General:   Alert,  pleasant and cooperative in NAD Head:  Normocephalic and atraumatic. Neck:  Supple; no masses or thyromegaly. Lungs:  Clear throughout to auscultation.    Heart:  Regular rate and rhythm. Abdomen:  Soft, nontender and nondistended. Normal bowel sounds, without guarding, and without rebound.  Neurologic:  Alert and  oriented x4;  grossly normal  neurologically.  Impression/Plan: Arleen B Dieppa is here for an colonoscopy to be performed for history of colon cancer  Risks, benefits, limitations, and alternatives regarding  colonoscopy have been reviewed with the patient.  Questions have been answered.  All parties agreeable.   Rogelia Copping, MD  05/11/2024, 11:11 AM

## 2024-05-11 NOTE — Anesthesia Preprocedure Evaluation (Signed)
 Anesthesia Evaluation  Patient identified by MRN, date of birth, ID band Patient awake    Reviewed: Allergy & Precautions, NPO status , Patient's Chart, lab work & pertinent test results  Airway Mallampati: III  TM Distance: <3 FB Neck ROM: full    Dental  (+) Teeth Intact, Missing   Pulmonary neg pulmonary ROS, shortness of breath and with exertion, former smoker   Pulmonary exam normal  + decreased breath sounds      Cardiovascular hypertension, Pt. on medications negative cardio ROS Normal cardiovascular exam Rhythm:Regular     Neuro/Psych    Depression    negative neurological ROS  negative psych ROS   GI/Hepatic negative GI ROS, Neg liver ROS,GERD  Medicated,,  Endo/Other  negative endocrine ROS  Class 3 obesity  Renal/GU negative Renal ROS  negative genitourinary   Musculoskeletal  (+) Arthritis ,    Abdominal  (+) + obese  Peds  Hematology negative hematology ROS (+) Blood dyscrasia, anemia   Anesthesia Other Findings Past Medical History: No date: Allergy No date: Anemia No date: Arthritis No date: Back pain 2015: Breast cancer (HCC)     Comment:  LT LUMPECTOMY 12-2014 FOLLOWING CHEMO No date: Carpal tunnel syndrome     Comment:  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 No date: Cataract No date: Chronic pain 01/2019: Colon cancer (HCC) 2020: Colon cancer (HCC) No date: Depression No date: Dyspnea No date: Family history of breast cancer No date: Family history of colon cancer No date: GERD (gastroesophageal reflux disease)     Comment:  problem with reflux during chemo No date: History of methicillin resistant staphylococcus aureus (MRSA) No date: Hypercholesteremia No date: Hypertension No date: Lumbosacral pain     Comment:  sees pain management in [place] No date: Obesity 2016: Personal history of chemotherapy     Comment:  left breast ca 2020: Personal history of chemotherapy      Comment:  colon ca No date: Pinched nerve     Comment:  left side, lumbar area 1994: Uterine cancer (HCC)  Past Surgical History: 1994: ABDOMINAL HYSTERECTOMY     Comment:  UTERINE CA 12/18/2014: AXILLARY LYMPH NODE BIOPSY; Left     Comment:  Procedure: AXILLARY LYMPH NODE BIOPSY/;  Surgeon:               Reyes LELON Cota, MD;  Location: [facility];  Service:               General;  Laterality: Left; 12/18/2014: AXILLARY LYMPH NODE DISSECTION; Left     Comment:  Procedure: AXILLARY LYMPH NODE DISSECTION;  Surgeon:               Reyes LELON Cota, MD;  Location: [facility];  Service:               General;  Laterality: Left; 05/2014: BREAST BIOPSY; Left     Comment:  CORE BX OF LN, METASTATIC ADENOCARCINOMA 09/10/2023: BREAST BIOPSY; Left     Comment:  US  Core Axilla, hydro mark- path pending 12/2014: BREAST LUMPECTOMY; Left     Comment:  CHEMO FIRST THEN SURGERY OF LN No date: BREAST SURGERY     Comment:  lymph node removal 11/26/2020: CATARACT EXTRACTION W/PHACO; Left     Comment:  Procedure: CATARACT EXTRACTION PHACO AND INTRAOCULAR               LENS PLACEMENT (IOC) LEFT;  Surgeon: Jaye Fallow,  MD;  Location: [facility];  Service:               Ophthalmology;  Laterality: Left;  6.40 00:43.9 12/10/2020: CATARACT EXTRACTION W/PHACO; Right     Comment:  Procedure: CATARACT EXTRACTION PHACO AND INTRAOCULAR               LENS PLACEMENT (IOC) RIGHT;  Surgeon: Jaye Fallow,               MD;  Location: [facility];  Service:               Ophthalmology;  Laterality: Right;  7.59 0:47.4 04/19/2016: CHOLECYSTECTOMY; N/A     Comment:  Procedure: LAPAROSCOPIC CHOLECYSTECTOMY WITH               INTRAOPERATIVE CHOLANGIOGRAM;  Surgeon: Dorothyann LITTIE Husk, MD;  Location: [facility];  Service: General;                Laterality: N/A; 01/10/2019: COLON RESECTION; Right     Comment:  Procedure: HAND ASSISTED LAPAROSCOPIC RIGHT COLON                RESECTION;  Surgeon: Jordis Laneta FALCON, MD;  Location: [facility];  Service: General;  Laterality: Right; 12/30/2018: COLONOSCOPY WITH PROPOFOL ; N/A     Comment:  Procedure: COLONOSCOPY WITH PROPOFOL ;  Surgeon: Jinny Carmine, MD;  Location: [facility];  Service:               Endoscopy;  Laterality: N/A; 03/05/2020: COLONOSCOPY WITH PROPOFOL ; N/A     Comment:  Procedure: COLONOSCOPY WITH PROPOFOL ;  Surgeon: Jinny Carmine, MD;  Location: [facility];  Service:               Endoscopy;  Laterality: N/A; 05/12/2022: COLONOSCOPY WITH PROPOFOL ; N/A     Comment:  Procedure: COLONOSCOPY WITH PROPOFOL ;  Surgeon: Jinny Carmine, MD;  Location: [facility];  Service:               Endoscopy;  Laterality: N/A; No date: EYE SURGERY 11/06/2015: medial branch block     Comment:  lumbar facet Dr. Dannial 12/30/2018: POLYPECTOMY; N/A     Comment:  Procedure: POLYPECTOMY;  Surgeon: Jinny Carmine, MD;                Location: [facility];  Service: Endoscopy;                Laterality: N/A;  Clips placed at Hepatic Flexure Polyp               (2) and Transverse Colon Polyp (4) removal sites 02/02/2019: PORTACATH PLACEMENT; Right     Comment:  Procedure: INSERTION PORT-A-CATH;  Surgeon: Jordis Laneta FALCON, MD;  Location: [facility];  Service: General;                Laterality: Right; No date: RADIOFREQUENCY ABLATION     Comment:  lumbar 12/18/2014: SENTINEL NODE BIOPSY; Left     Comment:  Procedure: SENTINEL NODE BIOPSY;  Surgeon: Reyes LELON Cota, MD;  Location: [facility];  Service: General;                Laterality: Left;     Reproductive/Obstetrics negative OB ROS                              Anesthesia Physical Anesthesia Plan  ASA: 3  Anesthesia Plan: General   Post-op Pain Management:    Induction:   PONV Risk Score and Plan: Propofol  infusion and  TIVA  Airway Management Planned: Natural Airway and Nasal Cannula  Additional Equipment:   Intra-op Plan:   Post-operative Plan:   Informed Consent: I have reviewed the patients History and Physical, chart, labs and discussed the procedure including the risks, benefits and alternatives for the proposed anesthesia with the patient or authorized representative who has indicated his/her understanding and acceptance.     Dental Advisory Given  Plan Discussed with: CRNA  Anesthesia Plan Comments:         Anesthesia Quick Evaluation

## 2024-05-11 NOTE — Op Note (Signed)
 [facility] Patient Name: Carrie Alvarez Procedure Date: 05/11/2024 11:05 AM MRN: [id number] Account #: [id number] Date of Birth: 02-10-1951 Admit Type: Outpatient Age: 73 Room: [facility] ENDO ROOM 4 Gender: Female Note Status: Finalized Instrument Name: Colon Scope [id number] Procedure:             Colonoscopy Indications:           High risk colon cancer surveillance: Personal history                         of colon cancer Providers:             Rogelia Copping MD, MD Referring MD:          Nancyann BRAVO. Gasper, MD (Referring MD) Medicines:             Propofol  per Anesthesia Complications:         No immediate complications. Procedure:             Pre-Anesthesia Assessment:                        - Prior to the procedure, a History and Physical was                         performed, and patient medications and allergies were                         reviewed. The patient's tolerance of previous                         anesthesia was also reviewed. The risks and benefits                         of the procedure and the sedation options and risks                         were discussed with the patient. All questions were                         answered, and informed consent was obtained. Prior                         Anticoagulants: The patient has taken no anticoagulant                         or antiplatelet agents. ASA Grade Assessment: II - A                         patient with mild systemic disease. After reviewing                         the risks and benefits, the patient was deemed in                         satisfactory condition to undergo the procedure.                        After obtaining informed consent, the colonoscope was  passed under direct vision. Throughout the procedure,                         the patient's blood pressure, pulse, and oxygen                         saturations were monitored continuously.  The                         Colonoscope was introduced through the anus and                         advanced to the the ileocolonic anastomosis. The                         colonoscopy was performed without difficulty. The                         patient tolerated the procedure well. The quality of                         the bowel preparation was excellent. Findings:      The perianal and digital rectal examinations were normal.      The colon (entire examined portion) appeared normal.      Non-bleeding internal hemorrhoids were found during retroflexion. The       hemorrhoids were Grade I (internal hemorrhoids that do not prolapse). Impression:            - The entire examined colon is normal.                        - Non-bleeding internal hemorrhoids.                        - No specimens collected. Recommendation:        - Discharge patient to home.                        - Resume previous diet.                        - Continue present medications.                        - Repeat colonoscopy in 5 years for surveillance. Procedure Code(s):     --- Professional ---                        [phone], Colonoscopy, flexible; diagnostic, including                         collection of specimen(s) by brushing or washing, when                         performed (separate procedure) Diagnosis Code(s):     --- Professional ---                        S14.961, Personal history of other malignant neoplasm  of large intestine CPT copyright 2022 American Medical Association. All rights reserved. The codes documented in this report are preliminary and upon coder review may  be revised to meet current compliance requirements. Rogelia Copping MD, MD 05/11/2024 11:39:18 AM This report has been signed electronically. Number of Addenda: 0 Note Initiated On: 05/11/2024 11:05 AM Scope Withdrawal Time: 0 hours 4 minutes 25 seconds  Total Procedure Duration: 0 hours 7 minutes 11 seconds   Estimated Blood Loss:  Estimated blood loss: none.      [facility]

## 2024-05-11 NOTE — Transfer of Care (Signed)
 Immediate Anesthesia Transfer of Care Note  Patient: Carrie Alvarez No  Procedure(s) Performed: COLONOSCOPY  Patient Location: PACU  Anesthesia Type:General  Level of Consciousness: sedated  Airway & Oxygen Therapy: Patient Spontanous Breathing  Post-op Assessment: Report given to RN and Post -op Vital signs reviewed and stable  Post vital signs: Reviewed and stable  Last Vitals:  Vitals Value Taken Time  BP 96/68 05/11/24 11:41  Temp 35.6 C 05/11/24 11:40  Pulse 77 05/11/24 11:41  Resp 19 05/11/24 11:41  SpO2 100 % 05/11/24 11:41  Vitals shown include unfiled device data.  Last Pain:  Vitals:   05/11/24 1140  TempSrc: Temporal  PainSc: 0-No pain         Complications: No notable events documented.

## 2024-05-12 ENCOUNTER — Other Ambulatory Visit: Payer: Self-pay | Admitting: Family Medicine

## 2024-05-12 DIAGNOSIS — F32A Depression, unspecified: Secondary | ICD-10-CM

## 2024-05-17 ENCOUNTER — Encounter: Payer: Self-pay | Admitting: Family Medicine

## 2024-05-22 ENCOUNTER — Ambulatory Visit: Admission: RE | Admit: 2024-05-22

## 2024-05-23 ENCOUNTER — Encounter: Payer: Self-pay | Admitting: Oncology

## 2024-05-23 ENCOUNTER — Other Ambulatory Visit

## 2024-05-23 ENCOUNTER — Ambulatory Visit: Admitting: Internal Medicine

## 2024-05-30 ENCOUNTER — Ambulatory Visit: Admitting: Internal Medicine

## 2024-05-30 ENCOUNTER — Other Ambulatory Visit

## 2024-06-15 ENCOUNTER — Other Ambulatory Visit: Payer: Self-pay | Admitting: Family Medicine

## 2024-06-15 DIAGNOSIS — J40 Bronchitis, not specified as acute or chronic: Secondary | ICD-10-CM

## 2024-06-19 ENCOUNTER — Ambulatory Visit

## 2024-06-19 VITALS — Ht 63.0 in | Wt 184.0 lb

## 2024-06-19 DIAGNOSIS — Z0000 Encounter for general adult medical examination without abnormal findings: Secondary | ICD-10-CM

## 2024-06-19 DIAGNOSIS — Z780 Asymptomatic menopausal state: Secondary | ICD-10-CM

## 2024-06-19 NOTE — Progress Notes (Signed)
 Chief Complaint  Patient presents with   Medicare Wellness     Subjective:   Carrie Alvarez is a 73 y.o. female who presents for a Medicare Annual Wellness Visit.  Visit info / Clinical Intake: Medicare Wellness Visit Type:: Subsequent Annual Wellness Visit Persons participating in visit and providing information:: patient Medicare Wellness Visit Mode:: Telephone If telephone:: video declined Since this visit was completed virtually, some vitals may be partially provided or unavailable. Missing vitals are due to the limitations of the virtual format.: Unable to obtain vitals - no equipment If Telephone or Video please confirm:: I connected with patient using audio/video enable telemedicine. I verified patient identity with two identifiers, discussed telehealth limitations, and patient agreed to proceed. Patient Location:: Home Provider Location:: Home Interpreter Needed?: No Pre-visit prep was completed: no AWV questionnaire completed by patient prior to visit?: no Living arrangements:: lives with spouse/significant other Patient's Overall Health Status Rating: good Typical amount of pain: some Does pain affect daily life?: no Are you currently prescribed opioids?: (!) yes  Dietary Habits and Nutritional Risks How many meals a day?: 2 (2-3 per pt) Eats fruit and vegetables daily?: yes Most meals are obtained by: preparing own meals In the last 2 weeks, have you had any of the following?: none Diabetic:: no  Functional Status Activities of Daily Living (to include ambulation/medication): Independent Ambulation: Independent with device- listed below Home Assistive Devices/Equipment: [place]; Eyeglasses Medication Administration: Independent Home Management (perform basic housework or laundry): Independent Manage your own finances?: yes Primary transportation is: driving Concerns about vision?: no *vision screening is required for WTM* Concerns about hearing?:  no  Fall Screening Falls in the past year?: 1 Number of falls in past year: 1 Was there an injury with Fall?: 0 Fall Risk Category Calculator: 2 Patient Fall Risk Level: Moderate Fall Risk  Fall Risk Patient at Risk for Falls Due to: Impaired balance/gait Fall risk Follow up: Falls evaluation completed; Falls prevention discussed  Home and Transportation Safety: All rugs have non-skid backing?: yes All stairs or steps have railings?: N/A, no stairs Grab bars in the bathtub or shower?: (!) no Have non-skid surface in bathtub or shower?: yes Good home lighting?: yes Regular seat belt use?: yes Hospital stays in the last year:: (!) yes How many hospital stays:: 1 Reason: infection in colon  Cognitive Assessment Difficulty concentrating, remembering, or making decisions? : no Will 6CIT or Mini Cog be Completed: no 6CIT or Mini Cog Declined: patient alert, oriented, able to answer questions appropriately and recall recent events  Advance Directives (For Healthcare) Does Patient Have a Medical Advance Directive?: No Does patient want to make changes to medical advance directive?: No - Patient declined Would patient like information on creating a medical advance directive?: No - Patient declined  Reviewed/Updated  Reviewed/Updated: Reviewed All (Medical, Surgical, Family, Medications, Allergies, Care Teams, Patient Goals)    Allergies (verified) Patient has no known allergies.   Current Medications (verified) Outpatient Encounter Medications as of 06/19/2024  Medication Sig   albuterol  (VENTOLIN  HFA) 108 (90 Base) MCG/ACT inhaler INHALE 2 PUFFS BY MOUTH EVERY 6 HOURS AS NEEDED FOR WHEEZING FOR SHORTNESS OF BREATH   amLODipine  (NORVASC ) 5 MG tablet Take 1 tablet (5 mg total) by mouth daily.   atorvastatin  (LIPITOR ) 80 MG tablet Take 1 tablet (80 mg total) by mouth daily.   baclofen  (LIORESAL ) 10 MG tablet Take 1 tablet (10 mg total) by mouth 2 (two) times daily.   cetirizine   (ZYRTEC ) 10  MG tablet TAKE 1 TABLET BY MOUTH ONCE DAILY AT  6AM   cholecalciferol  (VITAMIN D ) 1000 units tablet Take 1,000 Units by mouth daily.   fluticasone  (FLONASE ) 50 MCG/ACT nasal spray USE 2 SPRAY(S) IN EACH NOSTRIL ONCE DAILY   furosemide  (LASIX ) 20 MG tablet Take 1 tablet (20 mg total) by mouth daily as needed for fluid.   gabapentin  (NEURONTIN ) 600 MG tablet Take 1 tablet (600 mg total) by mouth 3 (three) times daily.   meclizine  (ANTIVERT ) 25 MG tablet Take 1 tablet (25 mg total) by mouth 3 (three) times daily as needed for dizziness.   NARCAN 4 MG/0.1ML LIQD nasal spray kit Place 1 spray into the nose.    Oxycodone  HCl 10 MG TABS LIMIT ONE HALF TO ONE TABLET BY MOUTH 3 TO 5 TIMES DAILY IF TOLERATED   promethazine -dextromethorphan (PROMETHAZINE -DM) 6.25-15 MG/5ML syrup Take 5 mLs by mouth 4 (four) times daily as needed for cough.   pyridoxine  (B-6) 100 MG tablet Take 100 mg by mouth daily.   sertraline  (ZOLOFT ) 100 MG tablet Take 2 tablets (200 mg total) by mouth daily.   traZODone  (DESYREL ) 50 MG tablet TAKE 1 TO 2 TABLETS BY MOUTH AT BEDTIME AS NEEDED FOR SLEEP   vitamin B-12 (CYANOCOBALAMIN ) 500 MCG tablet Take 500 mcg by mouth daily.   PREVIDENT 5000 DRY MOUTH 1.1 % GEL dental gel Place onto teeth at bedtime. (Patient not taking: Reported on 06/19/2024)   Facility-Administered Encounter Medications as of 06/19/2024  Medication   heparin  lock flush 100 unit/mL   heparin  lock flush 100 unit/mL   sodium chloride  flush (NS) 0.9 % injection 10 mL    History: Past Medical History:  Diagnosis Date   Allergy    Anemia    Arthritis    Back pain    Benign positional vertigo 09/04/2015   Breast cancer (HCC) 2015   LT LUMPECTOMY 12-2014 FOLLOWING CHEMO   Carpal tunnel syndrome    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   Cataract    Chronic pain    Colon cancer (HCC) 01/2019   Colon cancer (HCC) 2020   Depression    Dyspnea    Family history of breast cancer    Family  history of colon cancer    GERD (gastroesophageal reflux disease)    problem with reflux during chemo   History of methicillin resistant staphylococcus aureus (MRSA)    Hypercholesteremia    Hypertension    Lumbosacral pain    sees pain management in [place]   Obesity    Personal history of chemotherapy 2016   left breast ca   Personal history of chemotherapy 2020   colon ca   Pinched nerve    left side, lumbar area   Uterine cancer (HCC) 1994   Past Surgical History:  Procedure Laterality Date   ABDOMINAL HYSTERECTOMY  1994   UTERINE CA   AXILLARY LYMPH NODE BIOPSY Left 12/18/2014   Procedure: AXILLARY LYMPH NODE BIOPSY/;  Surgeon: Reyes LELON Cota, MD;  Location: [facility];  Service: General;  Laterality: Left;   AXILLARY LYMPH NODE DISSECTION Left 12/18/2014   Procedure: AXILLARY LYMPH NODE DISSECTION;  Surgeon: Reyes LELON Cota, MD;  Location: [facility];  Service: General;  Laterality: Left;   BREAST BIOPSY Left 05/2014   CORE BX OF LN, METASTATIC ADENOCARCINOMA   BREAST BIOPSY Left 09/10/2023   US  Core Axilla, hydro mark- path pending   BREAST LUMPECTOMY Left 12/2014   CHEMO FIRST THEN SURGERY OF  LN   BREAST SURGERY     lymph node removal   CATARACT EXTRACTION W/PHACO Left 11/26/2020   Procedure: CATARACT EXTRACTION PHACO AND INTRAOCULAR LENS PLACEMENT (IOC) LEFT;  Surgeon: Jaye Fallow, MD;  Location: [facility];  Service: Ophthalmology;  Laterality: Left;  6.40 00:43.9   CATARACT EXTRACTION W/PHACO Right 12/10/2020   Procedure: CATARACT EXTRACTION PHACO AND INTRAOCULAR LENS PLACEMENT (IOC) RIGHT;  Surgeon: Jaye Fallow, MD;  Location: [facility];  Service: Ophthalmology;  Laterality: Right;  7.59 0:47.4   CHOLECYSTECTOMY N/A 04/19/2016   Procedure: LAPAROSCOPIC CHOLECYSTECTOMY WITH INTRAOPERATIVE CHOLANGIOGRAM;  Surgeon: Dorothyann LITTIE Husk, MD;  Location: [facility];  Service: General;  Laterality: N/A;   COLON RESECTION Right 01/10/2019    Procedure: HAND ASSISTED LAPAROSCOPIC RIGHT COLON RESECTION;  Surgeon: Jordis Laneta FALCON, MD;  Location: [facility];  Service: General;  Laterality: Right;   COLONOSCOPY N/A 05/11/2024   Procedure: COLONOSCOPY;  Surgeon: Jinny Carmine, MD;  Location: [facility];  Service: Endoscopy;  Laterality: N/A;   COLONOSCOPY WITH PROPOFOL  N/A 12/30/2018   Procedure: COLONOSCOPY WITH PROPOFOL ;  Surgeon: Jinny Carmine, MD;  Location: [facility];  Service: Endoscopy;  Laterality: N/A;   COLONOSCOPY WITH PROPOFOL  N/A 03/05/2020   Procedure: COLONOSCOPY WITH PROPOFOL ;  Surgeon: Jinny Carmine, MD;  Location: [facility];  Service: Endoscopy;  Laterality: N/A;   COLONOSCOPY WITH PROPOFOL  N/A 05/12/2022   Procedure: COLONOSCOPY WITH PROPOFOL ;  Surgeon: Jinny Carmine, MD;  Location: [facility];  Service: Endoscopy;  Laterality: N/A;   EYE SURGERY     medial branch block  11/06/2015   lumbar facet Dr. Dannial   POLYPECTOMY N/A 12/30/2018   Procedure: POLYPECTOMY;  Surgeon: Jinny Carmine, MD;  Location: [facility];  Service: Endoscopy;  Laterality: N/A;  Clips placed at Hepatic Flexure Polyp (2) and Transverse Colon Polyp (4) removal sites   PORTACATH PLACEMENT Right 02/02/2019   Procedure: INSERTION PORT-A-CATH;  Surgeon: Jordis Laneta FALCON, MD;  Location: [facility];  Service: General;  Laterality: Right;   RADIOFREQUENCY ABLATION     lumbar   SENTINEL NODE BIOPSY Left 12/18/2014   Procedure: SENTINEL NODE BIOPSY;  Surgeon: Reyes LELON Cota, MD;  Location: [facility];  Service: General;  Laterality: Left;   Family History  Problem Relation Age of Onset   Breast cancer Sister 65   Colon cancer Sister    Colon cancer Mother        dx 4s   Hypertension Mother    Asthma Mother    Cancer Father        voice box removed   Cancer Maternal Grandmother        unsure type   Colon cancer Cousin    Cancer Cousin        unsure type   Social History   Occupational History   Occupation: retired     Comment: was on disability  Tobacco Use   Smoking status: Former    Current packs/day: 0.00    Types: Cigarettes    Quit date: 07/18/2014    Years since quitting: 9.9   Smokeless tobacco: Never  Vaping Use   Vaping status: Never Used  Substance and Sexual Activity   Alcohol use: No    Alcohol/week: 0.0 standard drinks of alcohol   Drug use: No   Sexual activity: Not Currently   Tobacco Counseling Counseling given: Not Answered  SDOH Screenings   Food Insecurity: No Food Insecurity (06/19/2024)  Housing: Unknown (06/19/2024)  Transportation Needs: No Transportation Needs (  06/19/2024)  Utilities: Not At Risk (06/19/2024)  Alcohol Screen: Low Risk  (01/13/2023)  Depression (PHQ2-9): Low Risk  (06/19/2024)  Recent Concern: Depression (PHQ2-9) - Medium Risk (04/10/2024)  Financial Resource Strain: Low Risk  (01/13/2023)  Physical Activity: Sufficiently Active (06/19/2024)  Social Connections: Socially Integrated (06/19/2024)  Stress: No Stress Concern Present (06/19/2024)  Tobacco Use: Medium Risk (06/19/2024)  Health Literacy: Adequate Health Literacy (06/19/2024)   See flowsheets for full screening details  Depression Screen PHQ 2 & 9 Depression Scale- Over the past 2 weeks, how often have you been bothered by any of the following problems? Little interest or pleasure in doing things: 0 Feeling down, depressed, or hopeless (PHQ Adolescent also includes...irritable): 0 PHQ-2 Total Score: 0 Trouble falling or staying asleep, or sleeping too much: 0 Feeling tired or having little energy: 1 (little energy) Poor appetite or overeating (PHQ Adolescent also includes...weight loss): 0 Feeling bad about yourself - or that you are a failure or have let yourself or your family down: 0 Trouble concentrating on things, such as reading the newspaper or watching television (PHQ Adolescent also includes...like school work): 0 Moving or speaking so slowly that other people could have noticed. Or the  opposite - being so fidgety or restless that you have been moving around a lot more than usual: 0 Thoughts that you would be better off dead, or of hurting yourself in some way: 0 PHQ-9 Total Score: 1 If you checked off any problems, how difficult have these problems made it for you to do your work, take care of things at home, or get along with other people?: Not difficult at all  Depression Treatment Depression Interventions/Treatment : EYV7-0 Score <4 Follow-up Not Indicated     Goals Addressed   None          Objective:    Today's Vitals   06/19/24 1041  Weight: 184 lb (83.5 kg)  Height: 5' 3 (1.6 m)   Body mass index is 32.59 kg/m.  Hearing/Vision screen Hearing Screening - Comments:: Denies hearing difficulties   Vision Screening - Comments:: Wears eyeglasses/ UTD/ [facility] Immunizations and Health Maintenance Health Maintenance  Topic Date Due   DTaP/Tdap/Td (1 - Tdap) Never done   Zoster Vaccines- Shingrix (1 of 2) Never done   Bone Density Scan  02/04/2023   Influenza Vaccine  02/11/2024   COVID-19 Vaccine (5 - 2025-26 season) 03/13/2024   Mammogram  08/23/2024   Medicare Annual Wellness (AWV)  06/19/2025   Colonoscopy  05/11/2029   Pneumococcal Vaccine: 50+ Years  Completed   Hepatitis C Screening  Completed   Meningococcal B Vaccine  Aged Out        Assessment/Plan:  This is a routine wellness examination for Carrie Alvarez.  Patient Care Team: Gasper Nancyann BRAVO, MD as PCP - General (Family Medicine) Jacobo Evalene PARAS, MD as Consulting Physician (Oncology) Dannial Hacker, MD as Attending Physician (Pain Medicine) Carolee Manus DASEN., MD as Consulting Physician (Ophthalmology) Maurie Rayfield BIRCH, RN as Oncology Nurse Navigator [name], Toribio Pac, MD as Referring Physician (Anesthesiology)  I have personally reviewed and noted the following in the patient's chart:   Medical and social history Use of alcohol, tobacco or illicit drugs   Current medications and supplements including opioid prescriptions. Functional ability and status Nutritional status Physical activity Advanced directives List of other physicians Hospitalizations, surgeries, and ER visits in previous 12 months Vitals Screenings to include cognitive, depression, and falls Referrals and appointments  No orders of the  defined types were placed in this encounter.  In addition, I have reviewed and discussed with patient certain preventive protocols, quality metrics, and best practice recommendations. A written personalized care plan for preventive services as well as general preventive health recommendations were provided to patient.   Marylon Verno L Kaleb Sek, CMA   06/19/2024   Return in 1 year (on 06/19/2025).  After Visit Summary: (MyChart) Due to this being a telephonic visit, the after visit summary with patients personalized plan was offered to patient via MyChart   Nurse Notes: Patient is due for a DEXA and order has been placed today.  She is also due for a Flu, Shingrix and Tdap vaccines.  Patient is requesting a refill on me Trazodone  50 mg-100 mg, PRN, today.  I informed patient that I would send that request to her provider.  She had no other concerns to address today.

## 2024-06-19 NOTE — Patient Instructions (Addendum)
 Carrie Alvarez,  Thank you for taking the time for your Medicare Wellness Visit. I appreciate your continued commitment to your health goals. Please review the care plan we discussed, and feel free to reach out if I can assist you further.  Please note that Annual Wellness Visits do not include a physical exam. Some assessments may be limited, especially if the visit was conducted virtually. If needed, we may recommend an in-person follow-up with your provider.  Ongoing Care Seeing your primary care provider every 3 to 6 months helps us  monitor your health and provide consistent, personalized care. Last office visit on 04/10/2024.  You are due for a flu vaccine, a shingles vaccine and a tetanus vaccine.  These can be given at your local pharmacy.  You are also due for a bone density screening.  Please discuss with your provider during your next office visit.  I have placed a request for your refill today.    Referrals If a referral was made during today's visit and you haven't received any updates within two weeks, please contact the referred provider directly to check on the status.  Recommended Screenings:  Health Maintenance  Topic Date Due   DTaP/Tdap/Td vaccine (1 - Tdap) Never done   Zoster (Shingles) Vaccine (1 of 2) Never done   Osteoporosis screening with Bone Density Scan  02/04/2023   Medicare Annual Wellness Visit  01/13/2024   Flu Shot  02/11/2024   COVID-19 Vaccine (5 - 2025-26 season) 03/13/2024   Breast Cancer Screening  08/23/2024   Colon Cancer Screening  05/11/2029   Pneumococcal Vaccine for age over 67  Completed   Hepatitis C Screening  Completed   Meningitis B Vaccine  Aged Out       06/19/2024   10:49 AM  Advanced Directives  Does Patient Have a Medical Advance Directive? No    Vision: Annual vision screenings are recommended for early detection of glaucoma, cataracts, and diabetic retinopathy. These exams can also reveal signs of chronic conditions such as  diabetes and high blood pressure.  Dental: Annual dental screenings help detect early signs of oral cancer, gum disease, and other conditions linked to overall health, including heart disease and diabetes.  Please see the attached documents for additional preventive care recommendations.

## 2024-07-05 ENCOUNTER — Encounter: Payer: Self-pay | Admitting: Oncology

## 2024-07-10 ENCOUNTER — Ambulatory Visit
Admission: RE | Admit: 2024-07-10 | Discharge: 2024-07-10 | Disposition: A | Discharge status: Home / Self Care | Source: Ambulatory Visit | Attending: Oncology | Admitting: Oncology

## 2024-07-10 DIAGNOSIS — C184 Malignant neoplasm of transverse colon: Secondary | ICD-10-CM | POA: Insufficient documentation

## 2024-07-10 MED ORDER — BARIUM SULFATE 2 % PO SUSP
450.0000 mL | Freq: Once | ORAL | Status: AC
  Administered 2024-07-10: 450 mL via ORAL

## 2024-07-10 MED ORDER — IOHEXOL 300 MG/ML  SOLN
100.0000 mL | Freq: Once | INTRAMUSCULAR | Status: AC | PRN
  Administered 2024-07-10: 100 mL via INTRAVENOUS

## 2024-07-18 ENCOUNTER — Other Ambulatory Visit: Payer: Self-pay

## 2024-07-18 DIAGNOSIS — C184 Malignant neoplasm of transverse colon: Secondary | ICD-10-CM

## 2024-07-19 ENCOUNTER — Inpatient Hospital Stay: Admitting: Oncology

## 2024-07-19 ENCOUNTER — Inpatient Hospital Stay

## 2024-07-19 ENCOUNTER — Telehealth: Payer: Self-pay

## 2024-07-19 ENCOUNTER — Encounter: Payer: Self-pay | Admitting: Oncology

## 2024-07-19 NOTE — Telephone Encounter (Signed)
 Randomized Trial of Standard Chemotherapy Alone or Combined with Atezolizumab  as Adjuvant Therapy for Stage III Colon Cancer and Deficient DNA Mismatch Repair   Patient did not show up for her follow up appointment today as scheduled. Research nurse attempted to call patient without success. Voice mailbox is full currently, and not able to take any messages. This nurse was unable to leave a message with the patient today to see when she could reschedule her follow up for her 60 month visit for [name] with Dr. Jacobo.  Reena Romans, RN 07/19/2024 1:39 PM
